# Patient Record
Sex: Male | Born: 1950
Health system: Southern US, Community
[De-identification: ages and names within clinical notes are randomized; demographics above are authoritative.]

## PROBLEM LIST (undated history)

## (undated) DIAGNOSIS — R601 Generalized edema: Secondary | ICD-10-CM

## (undated) DIAGNOSIS — D509 Iron deficiency anemia, unspecified: Secondary | ICD-10-CM

## (undated) DIAGNOSIS — M6281 Muscle weakness (generalized): Secondary | ICD-10-CM

## (undated) DIAGNOSIS — E119 Type 2 diabetes mellitus without complications: Secondary | ICD-10-CM

## (undated) DIAGNOSIS — L0390 Cellulitis, unspecified: Secondary | ICD-10-CM

## (undated) DIAGNOSIS — E8809 Other disorders of plasma-protein metabolism, not elsewhere classified: Secondary | ICD-10-CM

## (undated) DIAGNOSIS — R569 Unspecified convulsions: Secondary | ICD-10-CM

## (undated) DIAGNOSIS — I10 Essential (primary) hypertension: Secondary | ICD-10-CM

## (undated) DIAGNOSIS — N049 Nephrotic syndrome with unspecified morphologic changes: Secondary | ICD-10-CM

## (undated) DIAGNOSIS — E1121 Type 2 diabetes mellitus with diabetic nephropathy: Secondary | ICD-10-CM

## (undated) DIAGNOSIS — R748 Abnormal levels of other serum enzymes: Secondary | ICD-10-CM

## (undated) DIAGNOSIS — E778 Other disorders of glycoprotein metabolism: Secondary | ICD-10-CM

## (undated) DIAGNOSIS — E1129 Type 2 diabetes mellitus with other diabetic kidney complication: Secondary | ICD-10-CM

## (undated) DIAGNOSIS — K922 Gastrointestinal hemorrhage, unspecified: Secondary | ICD-10-CM

## (undated) DIAGNOSIS — I5030 Unspecified diastolic (congestive) heart failure: Secondary | ICD-10-CM

## (undated) DIAGNOSIS — A150 Tuberculosis of lung: Secondary | ICD-10-CM

## (undated) DIAGNOSIS — R262 Difficulty in walking, not elsewhere classified: Secondary | ICD-10-CM

## (undated) DIAGNOSIS — I8290 Acute embolism and thrombosis of unspecified vein: Secondary | ICD-10-CM

## (undated) HISTORY — DX: Difficulty in walking, not elsewhere classified: R26.2

## (undated) HISTORY — DX: Tuberculosis of lung: A15.0

## (undated) HISTORY — DX: Essential (primary) hypertension: I10

## (undated) HISTORY — DX: Muscle weakness (generalized): M62.81

## (undated) HISTORY — DX: Type 2 diabetes mellitus without complications: E11.9

## (undated) HISTORY — DX: Type 2 diabetes mellitus with other diabetic kidney complication: E11.29

## (undated) HISTORY — DX: Iron deficiency anemia, unspecified: D50.9

## (undated) HISTORY — DX: Type 2 diabetes mellitus with diabetic nephropathy: E11.21

## (undated) HISTORY — DX: Other disorders of glycoprotein metabolism: E77.8

---

## 2003-04-25 ENCOUNTER — Ambulatory Visit (HOSPITAL_COMMUNITY): Admission: RE | Admit: 2003-04-25 | Discharge: 2003-04-25 | Payer: Self-pay | Admitting: Chiropractic Medicine

## 2003-04-25 ENCOUNTER — Encounter: Payer: Self-pay | Admitting: Chiropractic Medicine

## 2008-10-10 ENCOUNTER — Emergency Department (HOSPITAL_COMMUNITY): Admission: EM | Admit: 2008-10-10 | Discharge: 2008-10-10 | Payer: Self-pay | Admitting: Family Medicine

## 2008-11-25 HISTORY — PX: KNEE ARTHROSCOPY: SHX127

## 2009-04-18 ENCOUNTER — Ambulatory Visit (HOSPITAL_COMMUNITY): Admission: RE | Admit: 2009-04-18 | Discharge: 2009-04-18 | Payer: Self-pay | Admitting: Family Medicine

## 2009-07-04 ENCOUNTER — Ambulatory Visit (HOSPITAL_COMMUNITY): Admission: RE | Admit: 2009-07-04 | Discharge: 2009-07-04 | Payer: Self-pay | Admitting: Orthopaedic Surgery

## 2009-08-30 ENCOUNTER — Encounter: Admission: RE | Admit: 2009-08-30 | Discharge: 2009-10-17 | Payer: Self-pay | Admitting: Orthopaedic Surgery

## 2010-08-31 ENCOUNTER — Encounter: Admission: RE | Admit: 2010-08-31 | Discharge: 2010-08-31 | Payer: Self-pay | Admitting: Specialist

## 2011-03-02 LAB — CBC
MCHC: 33.4 g/dL (ref 30.0–36.0)
MCV: 88 fL (ref 78.0–100.0)
Platelets: 201 10*3/uL (ref 150–400)
RBC: 4.73 MIL/uL (ref 4.22–5.81)
WBC: 6.1 10*3/uL (ref 4.0–10.5)

## 2011-03-02 LAB — HEPATIC FUNCTION PANEL
ALT: 30 U/L (ref 0–53)
Albumin: 3.1 g/dL — ABNORMAL LOW (ref 3.5–5.2)
Alkaline Phosphatase: 59 U/L (ref 39–117)
Total Protein: 6 g/dL (ref 6.0–8.3)

## 2011-03-02 LAB — BASIC METABOLIC PANEL
BUN: 13 mg/dL (ref 6–23)
Calcium: 8.9 mg/dL (ref 8.4–10.5)
Creatinine, Ser: 0.53 mg/dL (ref 0.4–1.5)
GFR calc Af Amer: >60 mL/min (ref >60)
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
## 2011-10-02 ENCOUNTER — Encounter: Payer: Self-pay | Admitting: Adult Health

## 2011-10-02 ENCOUNTER — Emergency Department (HOSPITAL_COMMUNITY)
Admission: EM | Admit: 2011-10-02 | Discharge: 2011-10-02 | Disposition: A | Discharge status: Home / Self Care | Payer: Self-pay | Attending: Emergency Medicine | Admitting: Emergency Medicine

## 2011-10-02 DIAGNOSIS — Z760 Encounter for issue of repeat prescription: Secondary | ICD-10-CM | POA: Insufficient documentation

## 2011-10-02 DIAGNOSIS — G8929 Other chronic pain: Secondary | ICD-10-CM | POA: Insufficient documentation

## 2011-10-02 DIAGNOSIS — M25569 Pain in unspecified knee: Secondary | ICD-10-CM | POA: Insufficient documentation

## 2011-10-02 NOTE — ED Notes (Signed)
Requesting medication refills

## 2011-10-02 NOTE — ED Provider Notes (Signed)
History    CSN: [id number] Arrival date & time: 10/02/2011  2:26 PM HPI LOCATES Joshua [name] is a 60 y.o. M who has chronic right knee pain due to multiple Arthroscopies. Reports he used a "cream" he borrowed from a friend that worked really well for his pain. States he is here for a prescription of the medication. States he called his Orthopedic physician. And was told he would need a $230 copay. States he is unable to afford that and was advised to come to the ED for the prescription. Denies a change in his knee pain. Denies erythema, edema, or new injury.   No past medical history on file.  No past surgical history on file.  No family history on file.  History  Substance Use Topics  . Smoking status: Not on file  . Smokeless tobacco: Not on file  . Alcohol Use: Not on file      Review of Systems  Constitutional: Negative for fever and chills.  Musculoskeletal: Negative for back pain and joint swelling.       Positive for knee pain  All other systems reviewed and are negative.    Allergies  Review of patient's allergies indicates not on file.  Home Medications  No current outpatient prescriptions on file.  BP 143/83  Pulse 80  Temp(Src) 98.4 F (36.9 C) (Oral)  Resp 18  SpO2 95%  Physical Exam  Constitutional: He is oriented to person, place, and time. He appears well-developed and well-nourished.  HENT:  Head: Normocephalic and atraumatic.  Eyes: Pupils are equal, round, and reactive to light.  Musculoskeletal:       Right knee: Normal. He exhibits normal range of motion, no swelling, no effusion, no deformity, no laceration, no erythema, normal alignment, no LCL laxity and normal patellar mobility.  Neurological: He is alert and oriented to person, place, and time.  Skin: Skin is warm and dry. No rash noted. No erythema. No pallor.  Psychiatric: He has a normal mood and affect. His behavior is normal.    ED Course  Procedures   3:39 PM. Waiting to  speak with [facility] Pharmacist. Currently waiting for 17 minutes Spoke with Pharmacist at [place]. States all ingredients need percentages and then regular sig. Will need to handwrite Rx  Dual Action A Compound Cream Amantadine 3%, Diclofenac 3%, Baclofen 2%, Gabapentin 10%, Cyclobenzaprine 2 %, Bupivicaine 5% Apply to affected area BID prn pain [place], [place] 10/02/11 1626

## 2011-10-03 NOTE — ED Provider Notes (Signed)
Medical screening examination/treatment/procedure(s) were performed by non-physician practitioner and as supervising physician I was immediately available for consultation/collaboration.   Alfonzo Feller, DO 10/03/11 [phone]

## 2012-09-11 ENCOUNTER — Ambulatory Visit
Admission: RE | Admit: 2012-09-11 | Discharge: 2012-09-11 | Disposition: A | Discharge status: Home / Self Care | Payer: No Typology Code available for payment source | Source: Ambulatory Visit | Attending: Specialist | Admitting: Specialist

## 2012-09-11 ENCOUNTER — Other Ambulatory Visit: Payer: Self-pay | Admitting: Specialist

## 2012-09-11 DIAGNOSIS — R7611 Nonspecific reaction to tuberculin skin test without active tuberculosis: Secondary | ICD-10-CM

## 2012-09-24 DIAGNOSIS — A150 Tuberculosis of lung: Secondary | ICD-10-CM

## 2012-09-24 HISTORY — DX: Tuberculosis of lung: A15.0

## 2012-10-07 ENCOUNTER — Encounter: Payer: Self-pay | Admitting: Internal Medicine

## 2012-10-07 ENCOUNTER — Ambulatory Visit (INDEPENDENT_AMBULATORY_CARE_PROVIDER_SITE_OTHER): Payer: Self-pay | Admitting: Internal Medicine

## 2012-10-07 VITALS — BP 166/96 | HR 92 | Temp 97.0°F | Ht 65.0 in | Wt 178.7 lb

## 2012-10-07 DIAGNOSIS — I872 Venous insufficiency (chronic) (peripheral): Secondary | ICD-10-CM

## 2012-10-07 DIAGNOSIS — A150 Tuberculosis of lung: Secondary | ICD-10-CM

## 2012-10-07 DIAGNOSIS — Z0000 Encounter for general adult medical examination without abnormal findings: Secondary | ICD-10-CM

## 2012-10-07 DIAGNOSIS — I8310 Varicose veins of unspecified lower extremity with inflammation: Secondary | ICD-10-CM

## 2012-10-07 DIAGNOSIS — Z23 Encounter for immunization: Secondary | ICD-10-CM

## 2012-10-07 HISTORY — DX: Tuberculosis of lung: A15.0

## 2012-10-07 NOTE — Assessment & Plan Note (Addendum)
The clinical presentation is most consistent with venous insufficiency and venous stasis dermatitis.  He is on his feet for long periods of time at his work.  I do not suspect heart failure (no evidence of pulmonary or hepatic congestion).  Nephrotic syndrome and liver failure are less likely since the edema is confined to the lower extremities.  Still we will need to check a CMET and a urinalysis once he has his orange card.  I have encouraged him to obtain compression stockings in the meantime and keep his feet elevated. - CMET after orange card - U/A after orange card

## 2012-10-07 NOTE — Assessment & Plan Note (Signed)
Quantiferon serum test was positive and CXR was suspicous.  AFB cultures at the health department were negative and he was deemed non-infectious. Twice weekly for the next 24 weeks: isoniazid 900mg , rifampin 600mg , and vitamin B6 50mg  Twice weekly for the next 6 weeks: ethambutol 4000mg  and pyrazinamide 4000mg 

## 2012-10-07 NOTE — Progress Notes (Signed)
Subjective:    Patient ID: Joshua [name], male    DOB: [birth year], 61 y.o.   MRN: [id number]  CC: leg swelling and pain  HPI:  This is a 61 year old man presenting to the clinic to establish care and with the complaint of lower extremity swelling and pain.  He immigrated to the [place] from [place] and has been seen twice by an immigration physician.  Once in 2011 when a serum quantiferon test for TB was performed and returned positive and then again in October 2013 for follow where a PA chest radiograph demonstrated some suspicious findings for TB.  Since then, sputum AFB cultures have returned negative at the [facility] and the patient has been deemed non-infectious.  He is receiving observed biweekly therapy through the health department.  For the leg swelling and pain, onset was several months ago.  The patient believes it was provoked by a mechanical fall and injury to his knee.  The pain is in his lower leg and feet bilaterally.  The swelling is now associated with weeping wounds, some open, on the anterior aspect of his legs right > left.    Review of Systems  Constitutional: Negative.  Negative for fever, chills, diaphoresis and unexpected weight change.  HENT: Negative.  Negative for hearing loss, congestion and rhinorrhea.   Eyes: Positive for visual disturbance (blurred).  Respiratory: Negative.  Negative for cough and shortness of breath.   Cardiovascular: Positive for leg swelling. Negative for chest pain.       Negative for orthopnea  Gastrointestinal: Negative.  Negative for nausea, vomiting, abdominal pain, diarrhea, constipation and blood in stool.  Genitourinary: Negative.  Negative for dysuria and hematuria.  Musculoskeletal: Positive for back pain.  Skin: Positive for rash (anterior lower leg).  Neurological: Negative.  Negative for dizziness, weakness, light-headedness, numbness and headaches.    Past Medical History  Diagnosis Date  . 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 Knee arthroscopy 2010    3 times, right knee    Current Outpatient Rx  Name Route Sig  . ETHAMBUTOL HCL 400 MG PO TABS Oral  Take 4,000 mg by mouth 2 (two) times a week.   . ISONIAZID 300 MG PO TABS Oral  Take 900 mg by mouth 2 (two) times a week.   . METHOCARBAMOL 500 MG PO TABS Oral  Take 500 mg by mouth 3 (three) times daily as needed. For muscle spasms   . PYRAZINAMIDE 500 MG PO TABS Oral  Take 4,000 mg by mouth 2 (two) times a week.   Marland Kitchen VITAMIN B-6 50 MG PO TABS Oral  Take 50 mg by mouth 2 (two) times a week.   Marland Kitchen RIFAMPIN 300 MG PO CAPS Oral  Take 600 mg by mouth 2 (two) times a week.     Family History None   History   Social History  . Marital Status: Single    Number of Children: 5  . 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 Smoking status: Former Smoker -- 20 years    Types: Cigarettes    Quit date: 11/25/1992  . Smokeless tobacco: Never Used  . Alcohol Use: 7.5 oz/week    15 drink(s) per week  . Drug Use: No  . Sexually Active: Yes -- Male partner(s)    Birth Control/  Protection: Condom     Comment: 09/2012: > 15 partners in past 3 years, inconsistent condom use   Social History Narrative   Has a masters degree in education      Objective:   Physical Exam GENERAL: overweight; no acute distress HEAD: atraumatic, normocephalic EYES: pupils equal, round and reactive; sclera anicteric; normal conjunctiva; fundoscopic exam limited by miosis EARS: canals and TMs normal bilaterally NOSE/THROAT: oropharynx clear, moist mucous membranes, several carious teeth NECK: supple, thyroid normal in size and without palpable nodules LYMPH: no cervical or supraclavicular lymphadenopathy LUNGS: clear to auscultation bilaterally, normal work of breathing HEART: normal rate and regular rhythm; normal S1  and S2 without S3 or S4; no murmurs, rubs, or clicks ABDOMEN: soft, non-tender, normal bowel sounds, no masses, liver 10cm by percussion, no palpable hepatosplenomegaly MSK: full range of motion of knee and ankles MOTOR: strength examination limited by pain in right lower leg but strength seemed intact and equal with leg extension; leg flexion, dorsiflexion, and plantarflexion were 5/5 bilaterally SENSATION: intact in the feet and legs REFLEXES: edema limited reflex testing, unable to elicit patellar or achilles reflexes; biceps reflex was 2+ and symmetric bilaterally CRANIAL NERVES: pupils reactive to light bilaterally; extra occular muscles are intact; facial sensation is intact and equal bilaterally in V1, V2, and V3, and masseter and temporalis function is intact; forehead wrinkles symmetrically, orbicularis oculi strength is normal and equal bilaterally, smile is symmetric, cheeks puff out equally without air excursion, and depressor anguli oris function is intact bilaterally; hearing is equal bilaterally, tested with a tuning fork; uvula is midline and palate elevates symmetrically; trapezius and sternocleidomastoid strength is normal and equal bilaterally; tongue protrudes midline. PULSES: dorsalis pedis, posterior tibial, and radial pulses are all 2+ SKIN: there is a flaky breakdown of the skin on the anterior shin consistent with venous stasis dermatitis, some associated open wounds on the right anterior shin draining a serous fluid, no erythema or warmth appreciated, toe nails are thickened and discolored   Filed Vitals:   10/07/12 1006  BP: 166/96  Pulse: 92  Temp:          Assessment & Plan:

## 2012-10-07 NOTE — Patient Instructions (Signed)
Look into sexually transmitted disease screening at the health department.  Look for compression stockings at a drug store or pharmacy.  Gather the information needed for your appointment with our financial counselor next week.  See [place] back her next Wednesday.  Venous Stasis and Chronic Venous Insufficiency  As people age, the veins located in their legs may weaken and stretch. When veins weaken and lose the ability to pump blood effectively, the condition is called chronic venous insufficiency (CVI) or venous stasis. Almost all veins return blood back to the heart. This happens by:  The force of the heart pumping fresh blood pushes blood back to the heart.  Blood flowing to the heart from the force of gravity.  In the deep veins of the legs, blood has to fight gravity and flow upstream back to the heart. Here, the leg muscles contract to pump blood back toward the heart. Vein walls are elastic, and many veins have small valves that only allow blood to flow in one direction. When leg muscles contract, they push inward against the elastic vein walls. This squeezes blood upward, opens the valves, and moves blood toward the heart. When leg muscles relax, the vein wall also relaxes and the valves inside the vein close to prevent blood from flowing backward. This method of pumping blood out of the legs is called the venous pump.  CAUSES  The venous pump works best while walking and leg muscles are contracting. But when a person sits or stands, blood pressure in leg veins can build. Deep veins are usually able to withstand short periods of inactivity, but long periods of inactivity (and increased pressure) can stretch, weaken, and damage vein walls. High blood pressure can also stretch and damage vein walls. The veins may no longer be able to pump blood back to the heart. Venous hypertension (high blood pressure inside veins) that lasts over time is a primary cause of CVI. CVI can also be caused by:  Deep  vein thrombosis, a condition where a thrombus (blood clot) blocks blood flow in a vein.  Phlebitis, an inflammation of a superficial vein that causes a blood clot to form.  Other risk factors for CVI may include:  Heredity.  Obesity.  Pregnancy.  Sedentary lifestyle.  Smoking.  Jobs requiring long periods of standing or sitting in one place.  Age and gender:  Women in their 15's and 92's and men in their 15's are more prone to developing CVI.  SYMPTOMS  Symptoms of CVI may include:  Varicose veins.  Ulceration or skin breakdown.  Lipodermatosclerosis, a condition that affects the skin just above the ankle, usually on the inside surface. Over time the skin becomes brown, smooth, tight and often painful. Those with this condition have a high risk of developing skin ulcers.  Reddened or discolored skin on the leg.  Swelling.  DIAGNOSIS  Your caregiver can diagnose CVI after performing a careful medical history and physical examination. To confirm the diagnosis, the following tests may also be ordered:  Duplex ultrasound.  Plethysmography (tests blood flow).  Venograms (x-ray using a special dye).  TREATMENT  The goals of treatment for CVI are to restore a person to an active life and to minimize pain or disability. Typically, CVI does not pose a serious threat to life or limb, and with proper treatment most people with this condition can continue to lead active lives. In most cases, mild CVI can be treated on an outpatient basis with simple procedures. Treatment methods  include:  Elastic compression socks.  Sclerotherapy, a procedure involving an injection of a material that "dissolves" the damaged veins. Other veins in the network of blood vessels take over the function of the damaged veins.  Vein stripping (an older procedure less commonly used).  Laser Ablation surgery.  Valve repair.  HOME CARE INSTRUCTIONS  Elastic compression socks must be worn every day. They can help with symptoms  and lower the chances of the problem getting worse, but they do not cure the problem.  Only take over-the-counter or prescription medicines for pain, discomfort, or fever as directed by your caregiver.  Your caregiver will review your other medications with you.  SEEK MEDICAL CARE IF:  You are confused about how to take your medications.  There is redness, swelling, or increasing pain in the affected area.  There is a red streak or line that extends up or down from the affected area.  There is a breakdown or loss of skin in the affected area, even if the breakdown is small.  You develop an unexplained oral temperature above 102 F (38.9 C).  There is an injury to the affected area.  SEEK IMMEDIATE MEDICAL CARE IF:  There is an injury and open wound to the affected area.  Pain is not adequately relieved with pain medication prescribed or becomes severe.  An oral temperature above 102 F (38.9 C) develops.  The foot/ankle below the affected area becomes suddenly numb or the area feels weak and hard to move.  MAKE SURE YOU:  Understand these instructions.  Will watch your condition.  Will get help right away if you are not doing well or get worse.  Document Released: 03/17/2007 Document Revised: 02/03/2012 Document Reviewed: 05/25/2007  [name] Patient Information 2013 [place].

## 2012-10-07 NOTE — Assessment & Plan Note (Addendum)
He received a flu shot today.  He is eligible for a zoster vaccine.  Patient is high risk for STDs.  Will need screening for HIV, gonorrhea, chlamydia, and syphilis.  I encouraged him to look into having this done at the health department.  He also needs screening for dyslipidemia, colon cancer, and diabetes.  We will do all these things once he has his orange card. - colonoscopy referral, lipid panel, and HgbA1c after orange card - continue monitoring for hypertension, blood pressure was 166/96 today

## 2012-10-14 ENCOUNTER — Encounter: Payer: Self-pay | Admitting: Internal Medicine

## 2012-10-14 ENCOUNTER — Ambulatory Visit (INDEPENDENT_AMBULATORY_CARE_PROVIDER_SITE_OTHER): Payer: Self-pay | Admitting: Internal Medicine

## 2012-10-14 VITALS — BP 162/98 | HR 90 | Temp 96.8°F | Ht 65.5 in | Wt 180.7 lb

## 2012-10-14 DIAGNOSIS — Z7251 High risk heterosexual behavior: Secondary | ICD-10-CM

## 2012-10-14 DIAGNOSIS — I872 Venous insufficiency (chronic) (peripheral): Secondary | ICD-10-CM

## 2012-10-14 DIAGNOSIS — I10 Essential (primary) hypertension: Secondary | ICD-10-CM

## 2012-10-14 DIAGNOSIS — I8310 Varicose veins of unspecified lower extremity with inflammation: Secondary | ICD-10-CM

## 2012-10-14 HISTORY — DX: Essential (primary) hypertension: I10

## 2012-10-14 LAB — COMPREHENSIVE METABOLIC PANEL
ALT: 54 U/L — ABNORMAL HIGH (ref 0–53)
CO2: 27 mEq/L (ref 19–32)
Calcium: 8.7 mg/dL (ref 8.4–10.5)
Chloride: 107 mEq/L (ref 96–112)
Glucose, Bld: 165 mg/dL — ABNORMAL HIGH (ref 70–99)
Sodium: 141 mEq/L (ref 135–145)
Total Bilirubin: 0.2 mg/dL — ABNORMAL LOW (ref 0.3–1.2)
Total Protein: 5.8 g/dL — ABNORMAL LOW (ref 6.0–8.3)

## 2012-10-14 MED ORDER — HORSE CHESTNUT 300 MG PO TABS: 300.0000 mg | ORAL_TABLET | Freq: Two times a day (BID) | ORAL | Status: DC

## 2012-10-14 MED ORDER — CHLORTHALIDONE 25 MG PO TABS: 25.0000 mg | ORAL_TABLET | Freq: Every day | ORAL | Status: DC

## 2012-10-14 NOTE — Patient Instructions (Addendum)
We are checking your blood chemistries, kidney function, and liver function today as well as for protein in your urine.  Please start taking chlorthalidone 25mg  once a day for your high blood pressures and for your leg swelling.  Again, try to find thigh-high compression stockings to wear at work.  You can try horse chestnut extract (escin) for you leg swelling.  If you get this, take 300mg  (1 tablet) twice a day.  Please go to the health department so that they can test you for HIV, syphilis, gonorrhea, and chlamydia.

## 2012-10-14 NOTE — Assessment & Plan Note (Addendum)
I discussed with the patient the reason we treat hypertension.  I also provided him with some educational material on hypertension.  I initiated therapy with chlorthalidone 25mg  daily.  I am checking for microalbuminuria today and evaluating kidney function. - chlorthalidone 25mg  daily  ADDENDUM: see telephone encounter 10/15/2012  Urine Microalbumin/Creatinine Ratio 2946.9*

## 2012-10-14 NOTE — Assessment & Plan Note (Addendum)
I still think venous insufficiency is the most likely diagnosis, but the new complaint of scrotal edema is concerning.  We will check renal and liver function today with a CMET and obtain a urinalysis for evaluation of proteinuria.  The absence of dyspnea and orthopnea make heart failure unlikely.  I have prescribed chlorthalidone 25mg  to treat hypertension as well as edema.  I have encouraged the patient to obtain and wear compressive, thigh-high stockings at work, and I have suggested the use of chestnut horse extract as this has been shown in placebo-controlled studies to be as efficacious as compressive stockings.  ADDENDUM: see telephone encounter 10/15/2012   CMP     Component Value Date/Time   NA 141 10/14/2012 1040   K 4.9 10/14/2012 1040   CL 107 10/14/2012 1040   CO2 27 10/14/2012 1040   GLUCOSE 165* 10/14/2012 1040   BUN 21 10/14/2012 1040   CREATININE 1.04 10/14/2012 1040   CREATININE 0.53 06/29/2009 1411   CALCIUM 8.7 10/14/2012 1040   PROT 5.8* 10/14/2012 1040   ALBUMIN 2.4* 10/14/2012 1040   AST 50* 10/14/2012 1040   ALT 54* 10/14/2012 1040   ALKPHOS 167* 10/14/2012 1040   BILITOT 0.2* 10/14/2012 1040   GFRNONAA >60 06/29/2009 1411   GFRAA >60 06/29/2009 1411   Urinalysis    Component Value Date/Time   COLORURINE YELLOW 10/14/2012 1040   APPEARANCEUR CLEAR 10/14/2012 1040   LABSPEC 1.027 10/14/2012 1040   PHURINE 5.5 10/14/2012 1040   GLUCOSEU 100* 10/14/2012 1040   HGBUR MOD* 10/14/2012 1040   BILIRUBINUR NEG 10/14/2012 1040   KETONESUR NEG 10/14/2012 1040   PROTEINUR > 300* 10/14/2012 1040   UROBILINOGEN 0.2 10/14/2012 1040   NITRITE NEG 10/14/2012 1040   LEUKOCYTESUR NEG 10/14/2012 1040

## 2012-10-14 NOTE — Assessment & Plan Note (Signed)
I again encourage the patient to go to the health department to receive screening for HIV, syphilis, gonorrhea, and chlamydia.

## 2012-10-14 NOTE — Progress Notes (Signed)
  Subjective:    Patient ID: Joshua [name], male    DOB: [birth year], 61 y.o.   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  One week ago, he had his first visit with [place].  He was complaining at that time of several months of lower extremity edema.  I suggested he obtain compressive stockings.  He has not been able to obtain these; they are quite expensive.  Now, however, he is complaining of painless scrotal swelling.  The serous drainage he was experiencing from his anterior lower legs is not active today, but is generally unchanged from 1 week ago.  He does complain of an isolated episode of dyspnea four days ago.  He was at work.  He bent down to tie his shoes and he acutely became short of breath.  A friend at work gave him an albuterol inhaler which he used.  This relieved the shortness of breath.  There was some mild chest pain associated but quickly resolved in a minute or two.  This has never happened to him before, and it has not happened since.  On review of systems, he denies cough, cold symptoms, sinonasal congestion, dyspnea at baseline, other episodes of chest pain, and orthopnea.    Review of Systems  Constitutional: Negative.  Negative for fever, chills and unexpected weight change.  HENT: Negative.  Negative for congestion and rhinorrhea.   Respiratory: Positive for shortness of breath. Negative for cough.   Cardiovascular: Positive for leg swelling. Negative for chest pain (negative except for HPI).       Negative for orthopnea  Genitourinary: Positive for scrotal swelling. Negative for testicular pain.       Objective:   Physical Exam GENERAL: overweight; no acute distress LUNGS: clear to auscultation bilaterally, normal work of breathing HEART: normal rate and regular rhythm; normal S1 and S2 without S3 or S4; no murmurs, rubs, or clicks ABDOMEN: distended but  soft, non-tender GU: normal circumcised penis, non-tender testicles, swollen scrotum SKIN:there is a flaky breakdown of the skin on the anterior shin consistent with venous stasis dermatitis, no erythema or warmth appreciated EXTREMITIES: 2+ pitting edema at the ankles   Filed Vitals:   10/14/12 0937  BP: 162/98  Pulse: 90  Temp: 96.8 F (36 C)        Assessment & Plan:

## 2012-10-15 ENCOUNTER — Telehealth: Payer: Self-pay | Admitting: Internal Medicine

## 2012-10-15 DIAGNOSIS — E1129 Type 2 diabetes mellitus with other diabetic kidney complication: Secondary | ICD-10-CM | POA: Insufficient documentation

## 2012-10-15 DIAGNOSIS — E778 Other disorders of glycoprotein metabolism: Secondary | ICD-10-CM

## 2012-10-15 DIAGNOSIS — E1121 Type 2 diabetes mellitus with diabetic nephropathy: Secondary | ICD-10-CM | POA: Insufficient documentation

## 2012-10-15 DIAGNOSIS — R739 Hyperglycemia, unspecified: Secondary | ICD-10-CM

## 2012-10-15 DIAGNOSIS — I10 Essential (primary) hypertension: Secondary | ICD-10-CM

## 2012-10-15 DIAGNOSIS — R600 Localized edema: Secondary | ICD-10-CM

## 2012-10-15 DIAGNOSIS — R809 Proteinuria, unspecified: Secondary | ICD-10-CM

## 2012-10-15 DIAGNOSIS — E8809 Other disorders of plasma-protein metabolism, not elsewhere classified: Secondary | ICD-10-CM | POA: Insufficient documentation

## 2012-10-15 HISTORY — DX: Other disorders of glycoprotein metabolism: E77.8

## 2012-10-15 LAB — URINALYSIS, ROUTINE W REFLEX MICROSCOPIC
Leukocytes, UA: NEGATIVE
Nitrite: NEGATIVE
Protein, ur: >300 mg/dL — AB
Urobilinogen, UA: 0.2 mg/dL (ref 0.0–1.0)

## 2012-10-15 LAB — MICROALBUMIN / CREATININE URINE RATIO: Microalb, Ur: 450.28 mg/dL — ABNORMAL HIGH (ref 0.00–1.89)

## 2012-10-15 LAB — URINALYSIS, MICROSCOPIC ONLY: Bacteria, UA: NONE SEEN

## 2012-10-15 MED ORDER — FUROSEMIDE 40 MG PO TABS: 40.0000 mg | ORAL_TABLET | Freq: Every day | ORAL | Status: DC

## 2012-10-15 NOTE — Assessment & Plan Note (Signed)
Concerning for nephrotic syndrome secondary to long-standing diabetes mellitus.

## 2012-10-15 NOTE — Assessment & Plan Note (Addendum)
Switching chlorthalidone to furosemide for increased diuretic effect. - stop chlorthalidone 25mg  daily - start furosemide 40mg  daily

## 2012-10-15 NOTE — Assessment & Plan Note (Signed)
Concerning for diabetes.  May be the cause of near nephrotic range proteinuria.  Hemoglobin A1c needed at next visit.

## 2012-10-15 NOTE — Telephone Encounter (Signed)
Spoke with patient.  Informed him of lab results.  Instructed him to stop taking chlorthalidone and instead start taking furosemide.  Rx sent to pharmacy.  Asked patient to [facility] December 6.  Message sent to front desk for rescheduling.  Urinalysis    Component Value Date/Time   COLORURINE YELLOW 10/14/2012 1040   APPEARANCEUR CLEAR 10/14/2012 1040   LABSPEC 1.027 10/14/2012 1040   PHURINE 5.5 10/14/2012 1040   GLUCOSEU 100* 10/14/2012 1040   HGBUR MOD* 10/14/2012 1040   BILIRUBINUR NEG 10/14/2012 1040   KETONESUR NEG 10/14/2012 1040   PROTEINUR > 300* 10/14/2012 1040   UROBILINOGEN 0.2 10/14/2012 1040   NITRITE NEG 10/14/2012 1040   LEUKOCYTESUR NEG 10/14/2012 1040    CMP     Component Value Date/Time   NA 141 10/14/2012 1040   K 4.9 10/14/2012 1040   CL 107 10/14/2012 1040   CO2 27 10/14/2012 1040   GLUCOSE 165* 10/14/2012 1040   BUN 21 10/14/2012 1040   CREATININE 1.04 10/14/2012 1040   CREATININE 0.53 06/29/2009 1411   CALCIUM 8.7 10/14/2012 1040   PROT 5.8* 10/14/2012 1040   ALBUMIN 2.4* 10/14/2012 1040   AST 50* 10/14/2012 1040   ALT 54* 10/14/2012 1040   ALKPHOS 167* 10/14/2012 1040   BILITOT 0.2* 10/14/2012 1040   GFRNONAA >60 06/29/2009 1411   GFRAA  Value: >60        The eGFR has been calculated using the MDRD equation. This calculation has not been validated in all clinical situations. eGFR's persistently <60 mL/min signify possible Chronic Kidney Disease. 06/29/2009 1411      Ref. Range 10/14/2012 10:40  Microalb, Ur Latest Range: 0.00-1.89 mg/dL 450.28 (H)  MICROALB/CREAT RATIO Latest Range: 0.0-30.0 mg/g 2946.9 (H)  Creatinine, Urine No range found 152.8

## 2012-10-15 NOTE — Assessment & Plan Note (Signed)
Concerning for hepatic congestion.  Failure of liver synthetic function can cause hypoproteinemia and edema, but not proteinuria.  Consider coagulation tests.

## 2012-10-15 NOTE — Assessment & Plan Note (Signed)
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

## 2012-10-27 ENCOUNTER — Ambulatory Visit (INDEPENDENT_AMBULATORY_CARE_PROVIDER_SITE_OTHER): Payer: Self-pay | Admitting: Internal Medicine

## 2012-10-27 ENCOUNTER — Encounter: Payer: Self-pay | Admitting: Internal Medicine

## 2012-10-27 VITALS — BP 158/86 | HR 106 | Temp 97.2°F | Ht 65.5 in | Wt 181.1 lb

## 2012-10-27 DIAGNOSIS — I10 Essential (primary) hypertension: Secondary | ICD-10-CM

## 2012-10-27 DIAGNOSIS — R809 Proteinuria, unspecified: Secondary | ICD-10-CM

## 2012-10-27 DIAGNOSIS — R600 Localized edema: Secondary | ICD-10-CM

## 2012-10-27 DIAGNOSIS — R7989 Other specified abnormal findings of blood chemistry: Secondary | ICD-10-CM

## 2012-10-27 DIAGNOSIS — E778 Other disorders of glycoprotein metabolism: Secondary | ICD-10-CM

## 2012-10-27 DIAGNOSIS — N5089 Other specified disorders of the male genital organs: Secondary | ICD-10-CM

## 2012-10-27 MED ORDER — FUROSEMIDE 40 MG PO TABS: 60.0000 mg | ORAL_TABLET | Freq: Two times a day (BID) | ORAL | Status: DC

## 2012-10-27 MED ORDER — LISINOPRIL 20 MG PO TABS: 20.0000 mg | ORAL_TABLET | Freq: Every day | ORAL | Status: DC

## 2012-10-27 NOTE — Assessment & Plan Note (Addendum)
Assessment: Likely contributed by his TB drug therapy, given last LFTs in 2010 normal versus possible HF contributing towards hepatic congestion (given scrotal and LE swelling) However, also has high-risk sexual behavior, therefore hepatitis and HIV must also be considered.  Plan:      Check hepatitis panel, HIV antibody, and GGT.  Recheck CMET today.  Tx as per scrotal swelling (see problem oriented charting)

## 2012-10-27 NOTE — Progress Notes (Addendum)
  Subjective:    Patient: Joshua [name]   Age/ Gender: 61 y.o., male   MRN: [id number]  DOB: 01/15/1951     HPI: Joshua [name] is a 61 y.o. with a PMHx of HTN and TB, who presented to clinic today for the following:  1) Lower extremity edema - Patient has been evaluated in [place] twice in 09/2012 for his several month history of BL lower extremity edema and scrotal swelling. It is thought to be secondary to venous stasis dermatitis, for which he was prescribed (and was previously noncompliant with) thigh-high stockings at work. Labs were checked, showing mild elevation of AST/ALT at 50/54 and Alk Phos of 167. These are up from 2010. Microalb/ cr ratio was also checked and found to be elevated at 2947. The patient was then started on lasix, which he is taking regularly. He has noted improvement in his LE edema. However, bilateral scrotal swelling without penile discharge or blood. Of note, the patient clarifies that his scrotal swelling preceded his use of the compression stockings.   Review of Systems: Per HPI.   Current Outpatient Medications: Medication Sig  . aspirin 325 MG EC tablet Take 325 mg by mouth daily.  Marland Kitchen ethambutol (MYAMBUTOL) 400 MG tablet Take 4,000 mg by mouth 2 (two) times a week.  . furosemide (LASIX) 40 MG tablet Take 1 tablet (40 mg total) by mouth daily.  . Horse Chestnut 300 MG TABS Take 1 tablet (300 mg total) by mouth 2 (two) times daily.  Marland Kitchen ibuprofen (ADVIL,MOTRIN) 200 MG tablet Take 200 mg by mouth every 6 (six) hours as needed.  . isoniazid (NYDRAZID) 300 MG tablet Take 900 mg by mouth 2 (two) times a week.  . pyrazinamide 500 MG tablet Take 4,000 mg by mouth 2 (two) times a week.  . pyridOXINE (VITAMIN B-6) 50 MG tablet Take 50 mg by mouth 2 (two) times a week.  . rifampin (RIFADIN) 300 MG capsule Take 600 mg by mouth 2 (two) times a week.    Allergies: No Known Allergies   Past Medical History  Diagnosis Date  . TB (pulmonary tuberculosis) 09/24/2012      deemed non-infectious  . Hypertension 10/14/2012    Past Surgical History  Procedure Date  . Knee arthroscopy 2010    3 times, right knee    Objective:    Physical Exam: Filed Vitals:   10/27/12 1614  BP: 158/86  Pulse: 106  Temp: 97.2 F (36.2 C)     General: Vital signs reviewed and noted. Well-developed, well-nourished, in no acute distress; alert, appropriate and cooperative throughout examination.  Head: Normocephalic, atraumatic.  Lungs:  Normal respiratory effort. Clear to auscultation BL without crackles or wheezes.  Heart: RRR. S1 and S2 normal without gallop, rubs. (+) systolic murmur.  Abdomen:  BS normoactive. Soft, Nondistended, non-tender.  No masses or organomegaly.  Extremities: 1+ pretibial edema. Compression stocking worn, therefore, difficult to assess character of LE in detail.  GU: Bilateral scrotal swelling (scrotum approximately 12cm (width) x9 cm (length)) with skin taut 2/2 fluid accumulation, with transillumination, consistent with fluid collection. No inguinal lymphadenopathy noted.  Exam chaperoned by Dr. Gilles Chiquito.    Assessment/ Plan:   Case and plan of care discussed with attending physician, Dr. Gilles Chiquito.

## 2012-10-27 NOTE — Assessment & Plan Note (Signed)
Assessment: Cause of patient scrotal swelling and lower extremity swelling remain unclear, however, there is concern for possible heart failure (particularly given history of uncontrolled HTN and mild LFT elevation per CMET) versus renal component (given significant proteinuria) contributing towards symptoms. On transillumination, seems to be significant fluid accumulation of scrotum. The patient remains without pain, color changes to scrotum, and swelling is bilateral, making epididymitis or testicular torsion unlikely to be contributing cause.  Plan:      Discussed with Dr. Daryll Drown.  Will increase Lasix to 60mg  BID. Pt to take 1.5 pills of his remaining Lasix 40mg  Rx.  Will start ACE-I (lisinopril 20mg ) to help with both the proteinuria, BP control, and potential HF.  If symptoms not improved by end of week (he is to follow-up with me on 12/6), then will consider scrotal ultrasound to further evaluate for GU pathology.  Will need a 2-D echo, however, pt cannot afford right now, and also states that because of his income, will be unable to get orange card.

## 2012-10-27 NOTE — Patient Instructions (Signed)
   Please follow-up at the clinic on 10/30/2012 at 2:45PM, at which time we will reevaluate your scrotal swelling - OR, please follow-up in the clinic sooner if needed.  There have been changes in your medications:  START Lisinopril for you blood pressure and for your swelling - this is to be taken ONE TIME a day.  CHANGE Furosemide to 60mg  (one and a half tablet) TWO TIMES a day.  STOP Ibuprofen and aspirin    If you have been started on new medication(s), and you develop symptoms concerning for allergic reaction, including, but not limited to, throat closing, tongue swelling, rash, please stop the medication immediately and call the clinic at [phone], and go to the ER.  If you are diabetic, please bring your meter to your next visit.  If symptoms worsen, or new symptoms arise, please call the clinic or go to the ER.  Please bring all of your medications in a bag to your next visit.

## 2012-10-28 LAB — COMPREHENSIVE METABOLIC PANEL
BUN: 27 mg/dL — ABNORMAL HIGH (ref 6–23)
CO2: 29 mEq/L (ref 19–32)
Creat: 0.83 mg/dL (ref 0.50–1.35)
Glucose, Bld: 192 mg/dL — ABNORMAL HIGH (ref 70–99)
Sodium: 142 mEq/L (ref 135–145)
Total Bilirubin: 0.6 mg/dL (ref 0.3–1.2)
Total Protein: 5.8 g/dL — ABNORMAL LOW (ref 6.0–8.3)

## 2012-10-28 LAB — HEPATITIS PANEL, ACUTE
Hep A IgM: NEGATIVE
Hep B C IgM: NEGATIVE
Hepatitis B Surface Ag: NEGATIVE

## 2012-10-28 NOTE — Progress Notes (Signed)
Quick Note:  LFTs downtrending from prior, although Alk Phos remains elevated. Will check GGT, have spoke with Linus Orn and Vickie in lab, who indicate this should be able to be added to labs drawn from yesterday.   ______

## 2012-10-28 NOTE — Addendum Note (Signed)
Addended by: Annamarie Dawley on: 10/28/2012 08:44 AM   Modules accepted: Orders

## 2012-10-29 LAB — GAMMA GT: GGT: 170 U/L — ABNORMAL HIGH (ref 7–51)

## 2012-10-29 NOTE — Progress Notes (Signed)
Quick Note:  Elevated alk phos in setting of elevated GGT suggests possible hepatic or biliary etiology. Will therefore consider RUQ [place]. Of note, hepatitis panel and HIV neg. ______

## 2012-10-30 ENCOUNTER — Encounter: Payer: Self-pay | Admitting: Internal Medicine

## 2012-10-30 ENCOUNTER — Ambulatory Visit (INDEPENDENT_AMBULATORY_CARE_PROVIDER_SITE_OTHER): Payer: Self-pay | Admitting: Internal Medicine

## 2012-10-30 VITALS — BP 150/81 | HR 97 | Temp 97.4°F | Wt 171.4 lb

## 2012-10-30 DIAGNOSIS — R7989 Other specified abnormal findings of blood chemistry: Secondary | ICD-10-CM

## 2012-10-30 DIAGNOSIS — I872 Venous insufficiency (chronic) (peripheral): Secondary | ICD-10-CM

## 2012-10-30 DIAGNOSIS — R739 Hyperglycemia, unspecified: Secondary | ICD-10-CM

## 2012-10-30 DIAGNOSIS — I10 Essential (primary) hypertension: Secondary | ICD-10-CM

## 2012-10-30 DIAGNOSIS — I8310 Varicose veins of unspecified lower extremity with inflammation: Secondary | ICD-10-CM

## 2012-10-30 DIAGNOSIS — N5089 Other specified disorders of the male genital organs: Secondary | ICD-10-CM

## 2012-10-30 DIAGNOSIS — R7309 Other abnormal glucose: Secondary | ICD-10-CM

## 2012-10-30 DIAGNOSIS — Z598 Other problems related to housing and economic circumstances: Secondary | ICD-10-CM

## 2012-10-30 NOTE — Progress Notes (Signed)
Patient: Joshua [name]   MRN: [id number]  DOB: 22-Jul-1951     Subjective:    HPI: Joshua [name] is a 61 y.o. M with a PMHx of HTN and TB, who presented to clinic today for recheck of the following:  1) Lower extremity edema / scrotal edema - Patient has been evaluated in [place] twice in 09/2012 for his several month history of BL lower extremity edema and scrotal swelling. It is thought to be secondary to venous stasis dermatitis, for which he was prescribed (and was previously noncompliant with) thigh-high stockings at work. Labs were checked, showing mild elevation of AST/ALT at 50/54 and Alk Phos of 167. These are up from 2010. Microalb/ cr ratio was also checked and found to be elevated at 2947. The patient was then started on lasix, which he is taking regularly. He has noted improvement in his LE edema. However, bilateral scrotal swelling without penile discharge or blood. Of note, the patient clarifies that his scrotal swelling preceded his use of the compression stockings.  During our visit together on 12/3, patient had significant discomfort from his scrotal edema, scrotum was very taut (although not painful), and in addition to his significant LE edema was thought possibly related to CHF (undiagnosed) in setting of untreated hypertension. He was started on ACE-I and Lasix 60mg  BID, and presents for reeval today.  States the swelling is persistent, but slightly improved. Remains without change in color or pain of his scrotum or testes. Remains without dysuria, hematuria, penile discharge, fevers, chills.  LE edema is improved. No SOB, DOE, CP. He is down 10 lbs.    Review of Systems: Per HPI.   Current Outpatient Medications: Medication Sig  . ethambutol (MYAMBUTOL) 400 MG tablet Take 4,000 mg by mouth 2 (two) times a week.  . furosemide (LASIX) 40 MG tablet Take 1.5 tablets (60 mg total) by mouth 2 (two) times daily.  . Horse Chestnut 300 MG TABS Take 1 tablet (300 mg total) by  mouth 2 (two) times daily.  Marland Kitchen isoniazid (NYDRAZID) 300 MG tablet Take 900 mg by mouth 2 (two) times a week.  Marland Kitchen lisinopril (PRINIVIL,ZESTRIL) 20 MG tablet Take 1 tablet (20 mg total) by mouth daily.  . pyrazinamide 500 MG tablet Take 4,000 mg by mouth 2 (two) times a week.  . pyridOXINE (VITAMIN B-6) 50 MG tablet Take 50 mg by mouth 2 (two) times a week.  . rifampin (RIFADIN) 300 MG capsule Take 600 mg by mouth 2 (two) times a week.    Allergies No Known Allergies   Past Medical History  Diagnosis Date  . 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 Hypertension 10/14/2012    Past Surgical History  Procedure Date  . Knee arthroscopy 2010    3 times, right knee     Objective:    Physical Exam: Filed Vitals:   10/30/12 1446  BP: 150/81  Pulse: 97  Temp: 97.4 F (36.3 C)      General: Vital signs reviewed and noted. Well-developed, well-nourished, in no acute distress; alert, appropriate and cooperative throughout examination.  Head: Normocephalic, atraumatic.  Lungs:  Normal respiratory effort. Clear to auscultation BL without crackles or wheezes.  Heart: RRR. S1 and S2 normal without gallop, rubs. (+) systolic murmur.  Abdomen:  BS normoactive. Soft, Nondistended, non-tender.  No masses or organomegaly.  Extremities: 1-2+ pretibial edema (improved from last time - the 2+ was revealed just below the knee after compression  stockings removed (not evaluated last visit). Bilateral significant dry and flaky skin. Multiple centimeter sized open wounds without active drainage on RLE. No surrounding erythema, induration. Mild warmth.  GU: Bilateral scrotal swelling (scrotum approximately 12cm (width) x9 cm (length)) with that is significantly less taut than 3 days ago. No inguinal lymphadenopathy noted.  Exam chaperoned by Sander Nephew, RN.     Assessment/ Plan:   Case and plan of care discussed with attending physician, Dr. Madilyn Fireman.

## 2012-10-30 NOTE — Patient Instructions (Signed)
   Please follow-up at the clinic in 1 week , at which time we will reevaluate your swelling - OR, please follow-up in the clinic sooner if needed.  There have not been changes in your medications.   You are getting labs today, if they are abnormal I will give you a call.   If you have been started on new medication(s), and you develop symptoms concerning for allergic reaction, including, but not limited to, throat closing, tongue swelling, rash, please stop the medication immediately and call the clinic at [phone], and go to the ER.  If symptoms worsen, or new symptoms arise, please call the clinic or go to the ER.  Please bring all of your medications in a bag to your next visit.

## 2012-10-31 LAB — BASIC METABOLIC PANEL
CO2: 28 mEq/L (ref 19–32)
Calcium: 8.8 mg/dL (ref 8.4–10.5)
Potassium: 4.4 mEq/L (ref 3.5–5.3)
Sodium: 139 mEq/L (ref 135–145)

## 2012-10-31 NOTE — Assessment & Plan Note (Signed)
Pertinent Data: Comprehensive Metabolic Panel:    Component Value Date/Time   NA 139 10/30/2012 1609   K 4.4 10/30/2012 1609   CL 105 10/30/2012 1609   CO2 28 10/30/2012 1609   BUN 20 10/30/2012 1609   CREATININE 1.04 10/30/2012 1609   CREATININE 0.53 06/29/2009 1411   GLUCOSE 193* 10/30/2012 1609   CALCIUM 8.8 10/30/2012 1609   AST 36 10/27/2012 1657   ALT 40 10/27/2012 1657   ALKPHOS 143* 10/27/2012 1657   BILITOT 0.6 10/27/2012 1657   PROT 5.8* 10/27/2012 1657   ALBUMIN 2.3* 10/27/2012 1657    Assessment: Alk phos remains elevated, GGT was also elevated,   Plan:

## 2012-10-31 NOTE — Assessment & Plan Note (Addendum)
Assessment: Cause of patient scrotal swelling and lower extremity swelling remain unclear, however, there is concern for possible heart failure (particularly given history of uncontrolled HTN and mild LFT elevation per CMET) versus renal component (given significant proteinuria) contributing towards symptoms.  The patient remains without pain, color changes to scrotum, and swelling is bilateral, making epididymitis or testicular torsion unlikely to be contributing cause. Case reports have also identified scrotal swelling (and moreso testicular or epidydimal) extrapulmonary manifestations of TB - this has not yet been able to be evaluated as patient cannot afford scrotal US at this time.  Plan:      Discussed with Dr. Ellwood Dense.  Continue Lasix to 60mg  BID and ACE-I (lisinopril 20mg ) to help with both the proteinuria, BP control, and potential HF.  Will bring back in 1 week to reevaluate and consider scrotal ultrasound to further evaluate for GU pathology (will need to check into how much out of pocket expense will be for the patient - as cost is very prohibitive).  Will need a 2-D echo, however, pt cannot afford right now, and also states that because of his income, will be unable to get orange card.  At next visit, will need urinalysis and urine culture to evaluate for possible TB involvement of the testes.

## 2012-11-02 DIAGNOSIS — Z598 Other problems related to housing and economic circumstances: Secondary | ICD-10-CM | POA: Insufficient documentation

## 2012-11-02 NOTE — Assessment & Plan Note (Signed)
Pertinent Data:  Ref. Range 10/14/2012  10/27/2012  10/30/2012   Glucose Latest Range: 70-99 mg/dL 165 (H) 192 (H) 193 (H)    Assessment: Will need to clarify with the patient regarding if these were fasting or non-fasting evaluations. If non-fasting and < 2hours postprandial, would not quite be to range of DM. However, if these are fasting values, then certainly concerning.  Plan:      Will review with pt next visit, consider A1c.

## 2012-11-02 NOTE — Assessment & Plan Note (Signed)
Assessment: Patient has financial difficulties, limited income and savings. He is unsure if he will be able to get aide because he is earning a minimal amount. However, at present, does not have any insurance coverage or aide - therefore, is having to pay large clinic bills out of pocket.  Plan:      Will refer to [facility], to evaluate if he may qualify at least for partial coverage of orange card.  Will attempt to be as cost effective as possible for his labs/ studies.

## 2012-11-02 NOTE — Assessment & Plan Note (Addendum)
Assessment: Patient has multiple small opened lesions on his bilateral lower extremities that are not draining at this time. May need skin biopsy in the future, to evaluate if possible relationship to TB scrofula? He will need wound care once financial situation is sorted out. No indication of active infection at this time.  Plan:      Will need wound care follow-up soon (once financial issues sorted out).  Consider skin biopsy in the future (after again financial issues sorted out).  Reviewed red flag symptoms that should prompt reevaluation immediately.

## 2012-11-02 NOTE — Assessment & Plan Note (Signed)
Pertinent Data: BP Readings from Last 3 Encounters:  10/30/12 150/81  10/27/12 158/86  10/14/12 [id number]    Basic Metabolic Panel:    Component Value Date/Time   NA 139 10/30/2012 1609   K 4.4 10/30/2012 1609   CL 105 10/30/2012 1609   CO2 28 10/30/2012 1609   BUN 20 10/30/2012 1609   CREATININE 1.04 10/30/2012 1609   CREATININE 0.53 06/29/2009 1411   GLUCOSE 193* 10/30/2012 1609   CALCIUM 8.8 10/30/2012 1609    Assessment: Disease Control:    Progress toward goals:    Barriers to meeting goals: no barriers, has just had ACE-I and Lasix started 3 days ago, therefore, likely does not completely reflect full effect of these medications.      Patient is compliant most of the time with prescribed medications.   Plan:  continue current medications  Educational resources provided:   Fluid restriction to 1500-2000 mL daily.  Self management tools provided:

## 2012-11-06 ENCOUNTER — Encounter: Payer: Self-pay | Admitting: Internal Medicine

## 2012-11-06 ENCOUNTER — Ambulatory Visit (INDEPENDENT_AMBULATORY_CARE_PROVIDER_SITE_OTHER): Payer: Self-pay | Admitting: Internal Medicine

## 2012-11-06 VITALS — BP 157/95 | HR 89 | Temp 97.4°F | Ht 65.0 in | Wt 160.3 lb

## 2012-11-06 DIAGNOSIS — N5089 Other specified disorders of the male genital organs: Secondary | ICD-10-CM

## 2012-11-06 DIAGNOSIS — R739 Hyperglycemia, unspecified: Secondary | ICD-10-CM

## 2012-11-06 DIAGNOSIS — E8809 Other disorders of plasma-protein metabolism, not elsewhere classified: Secondary | ICD-10-CM

## 2012-11-06 DIAGNOSIS — E1169 Type 2 diabetes mellitus with other specified complication: Secondary | ICD-10-CM

## 2012-11-06 DIAGNOSIS — Z79899 Other long term (current) drug therapy: Secondary | ICD-10-CM

## 2012-11-06 DIAGNOSIS — I872 Venous insufficiency (chronic) (peripheral): Secondary | ICD-10-CM

## 2012-11-06 DIAGNOSIS — E778 Other disorders of glycoprotein metabolism: Secondary | ICD-10-CM

## 2012-11-06 DIAGNOSIS — I8310 Varicose veins of unspecified lower extremity with inflammation: Secondary | ICD-10-CM

## 2012-11-06 DIAGNOSIS — E1129 Type 2 diabetes mellitus with other diabetic kidney complication: Secondary | ICD-10-CM

## 2012-11-06 DIAGNOSIS — I10 Essential (primary) hypertension: Secondary | ICD-10-CM

## 2012-11-06 DIAGNOSIS — R7989 Other specified abnormal findings of blood chemistry: Secondary | ICD-10-CM

## 2012-11-06 DIAGNOSIS — N058 Unspecified nephritic syndrome with other morphologic changes: Secondary | ICD-10-CM

## 2012-11-06 DIAGNOSIS — R809 Proteinuria, unspecified: Secondary | ICD-10-CM

## 2012-11-06 LAB — COMPREHENSIVE METABOLIC PANEL
ALT: 23 U/L (ref 0–53)
CO2: 32 mEq/L (ref 19–32)
Calcium: 8.8 mg/dL (ref 8.4–10.5)
Chloride: 101 mEq/L (ref 96–112)
Creat: 0.82 mg/dL (ref 0.50–1.35)
Glucose, Bld: 168 mg/dL — ABNORMAL HIGH (ref 70–99)
Sodium: 140 mEq/L (ref 135–145)
Total Protein: 6.5 g/dL (ref 6.0–8.3)

## 2012-11-06 LAB — PRO B NATRIURETIC PEPTIDE: Pro B Natriuretic peptide (BNP): 1474 pg/mL — ABNORMAL HIGH (ref <126)

## 2012-11-06 LAB — LIPID PANEL
Total CHOL/HDL Ratio: 2.3 Ratio
VLDL: 10 mg/dL (ref 0–40)

## 2012-11-06 LAB — GLUCOSE, CAPILLARY: Glucose-Capillary: 151 mg/dL — ABNORMAL HIGH (ref 70–99)

## 2012-11-06 MED ORDER — METOPROLOL TARTRATE 25 MG PO TABS: 25.0000 mg | ORAL_TABLET | Freq: Two times a day (BID) | ORAL | Status: DC

## 2012-11-06 NOTE — Patient Instructions (Signed)
General Instructions: 1) Start taking metoprolol tartrate 25mg  twice a day.   Treatment Goals:  Goals (1 Years of Data) as of 11/06/2012          As of Today 10/30/12 10/27/12 10/14/12 10/07/12     Blood Pressure    . Blood Pressure < 140/90  157/95 150/81 158/86 162/98 166/96      Progress Toward Treatment Goals:  Treatment Goal 11/06/2012  Blood pressure unchanged    Self Care Goals & Plans:  Self Care Goal 11/06/2012  Manage my medications take my medicines as prescribed; refill my medications on time  Monitor my health (No Data)  Eat healthy foods drink diet soda or water instead of juice or soda; eat foods that are low in salt; eat baked foods instead of fried foods  Be physically active find time in my schedule       Care Management & Community Referrals:  Referral 11/06/2012  Referrals made for care management support none needed

## 2012-11-06 NOTE — Assessment & Plan Note (Addendum)
His creatinine increased from 0.53 in 2010 to 1.04 this year and gross proteinuria with an albumin to creatinine ratio of 2946.9 clearly demonstrates nephropathy.  I think the etiology of this is untreated and long-standing hypertension and diabetes mellitus. I am repeating a urine protein to creatinine ratio today for trending purposes as well as a comprehensive metabolic panel. Given his blood pressure and hemoglobin A1c, I think it is safe to assume that this nephropathy is from hypertension and diabetes. As such, aggressive treatment of these conditions will be undertaken. He is on lisinopril 20 mg already in hopes of controlling his proteinuria; he may require addition of verapamil or another calcium channel blocker. - Repeating urine protein to creatinine ratio - Repeating comprehensive metabolic - Lipid panel ordered  Lab Results  Component Value Date   HGBA1C 8.9 11/06/2012    ADDENDUM: Urine protein to creatinine ratio has decreased but remains elevated since starting an ACE inhibitor. Continue trending will be necessary. Based on the laboratory data gathered at this visit, I think it is clear that long-standing and untreated diabetes mellitus is at least in part to blame for this patient's nephropathy.  Protein to creatinine ratio Component Value Date/Time   Prot-Cr ratio 6.48 11/06/2012 1635   Lipid Panel  Component Value Date/Time   CHOL 193 11/06/2012 1635   TRIG 49 11/06/2012 1635   HDL 83 11/06/2012 1635   LDLCALC 100* 11/06/2012 1635   CMP  Component Value Date/Time   NA 140 11/06/2012 1635   K 4.3 11/06/2012 1635   CL 101 11/06/2012 1635   CO2 32 11/06/2012 1635   GLUCOSE 168* 11/06/2012 1635   BUN 24* 11/06/2012 1635   CREATININE 0.82 11/06/2012 1635   CALCIUM 8.8 11/06/2012 1635   PROT 6.5 11/06/2012 1635   ALBUMIN 2.4* 11/06/2012 1635   AST 26 11/06/2012 1635   ALT 23 11/06/2012 1635   ALKPHOS 124* 11/06/2012 1635   BILITOT 0.5 11/06/2012 1635

## 2012-11-06 NOTE — Assessment & Plan Note (Addendum)
Most likely multifactorial. Gross proteinuria is certainly contributing; a component of dilution from heart failure may be contributing as well. I am repeating a urine protein to creatinine ratio and a comprehensive metabolic panel today. He is already on an ACE inhibitor with fosinopril 20 mg; addition of a calcium channel blocker may be needed in the future.

## 2012-11-06 NOTE — Assessment & Plan Note (Signed)
This patient may indeed have venous insufficiency of the lower extremities, however I think an additional process is contributing to his lower extremity edema. Heart, liver, or renal failure may be contributing.

## 2012-11-06 NOTE — Assessment & Plan Note (Addendum)
BP Readings from Last 3 Encounters:  11/06/12 157/95  10/30/12 150/81  10/27/12 158/86    Lab Results  Component Value Date   NA 139 10/30/2012   K 4.4 10/30/2012   CREATININE 1.04 10/30/2012    Assessment:  Blood pressure control: moderately elevated  Progress toward BP goal:  unchanged  Comments: This patient's creatinine has doubled since it was last checked 3 years ago and he is now demonstrating significant proteinuria. I'm afraid that untreated hypertension may be partly to blame for this, so an aggressive approach is appropriate.  Plan:  Medications:  I will continue treatment with furosemide 60 mg twice a day and lisinopril 20 mg daily. I am also adding metoprolol tartrate 25 mg twice a day today.  Educational resources provided: handout  - Continue furosemide 60 mg twice a day - Continue lisinopril 20 mg daily - Start metoprolol tartrate 25 mg twice a day

## 2012-11-06 NOTE — Assessment & Plan Note (Signed)
I think this is a sequelae of the process that is causing his lower extremity edema. I do not believe he is suffering from hepatic failure, but we are checking a comprehensive metabolic panel again today. I think he has clearly demonstrated a degree of renal insufficiency with a 50% increase in creatinine since 2010 and now gross proteinuria. His elevated proBNP today also raises the suspicion of heart failure. With long-standing hypertension this would not be a surprise. Despite his financial limitations, I think it is imperative that we order an echocardiogram to assess his heart function. - order an echocardiogram at his next visit

## 2012-11-06 NOTE — Assessment & Plan Note (Addendum)
Lab Results  Component Value Date   HGBA1C 8.9 11/06/2012     Assessment: The day prior nonfasting blood glucose levels were suspicious for diabetes. Hemoglobin A1c today of 8.9 confirms this diagnosis. Long-standing, untreated diabetes is likely to blame, at least in part, to his kidney injury.  Plan: The patient has been scheduled for a followup appointment in one month. At that time, further education on diabetes, its complications, and its treatments need to be discussed with the patient.  - Diagnosed at this visit, no treatment

## 2012-11-06 NOTE — Progress Notes (Signed)
  Subjective:    Patient ID: Joshua [name], male    DOB: 1951/09/02, 61 y.o.   MRN: [id number]  HPI: This is a 61 year old man here for followup. He established care with me in November. Prior to this he had very little health care attention. 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 At his first visit, his principal complaint was lower extremity swelling which appeared consistent with venous insufficiency. At a followup visit, he was found to have large amounts of proteinuria (microalbumin to creatinine ratio of 2946.9) and he had developed scrotal edema. He has also been diagnosed with since establishing with [place]. Pharmacotherapy has been advanced over his last few visits, and he is now on furosemide 60 mg twice a day and lisinopril 20 mg daily. He feels like the swelling has improved greatly in his legs and to a lesser degree in his scrotum. He actually is not sure why he had to come to this appointment today. He denies orthopnea, sleeping on one pillow at night; he also denies chest pain and palpitations. He does report some exertional dyspnea.    Review of Systems Constitutional: Negative, no fevers or chills HEENT: Negative Eyes: He does report some blurred vision, denies diplopia Respiratory: Exertional dyspnea only Cardiac: Denies chest pain, orthopnea, paroxysmal nocturnal dyspnea, and palpitations; reports exertional dyspnea Gastrointestinal: Denies abdominal pain, diarrhea, constipation Genitourinary: Scrotal swelling, but denies urinary symptoms Skin: Weeping ulcerations on anterior shins, improving     Objective:   Physical Exam GENERAL: Overweight; no acute distress LUNGS: clear to auscultation bilaterally, no rales, normal work of breathing HEART: normal rate and regular rhythm; normal S1 and S2 without S3 or S4; no murmurs, rubs, or clicks PULSES: radial 2+ and symmetric ABDOMEN: obese,  non-tender, normal bowel sounds, no hepatomegaly, no masses SKIN: Compression stockings and dressings cover the anterior shins where he has weeping ulcerations associated with this lower extremity swelling, otherwise skin is warm, dry, and intact EXTREMITIES: 1+ pitting edema appreciated through the compression stockings on his anterior shins bilaterally, no clubbing or cyanosis  Filed Vitals:   11/06/12 1513  BP: 157/95  Pulse: 89  Temp: 97.4 F (36.3 C)   BP Readings from Last 3 Encounters:  11/06/12 157/95  10/30/12 150/81  10/27/12 158/86   Basename 11/06/12 1634  GLUCAP 151*   Lab Results  Component Value Date   HGBA1C 8.9* 11/06/2012   BNP    Component Value Date/Time   PROBNP 1474* 11/06/2012 1036         Assessment & Plan:

## 2012-11-06 NOTE — Assessment & Plan Note (Addendum)
Her BNP is elevated today at 1474, which may indicate heart failure. If this is the case, hepatic venous congestion may be to blame for this transaminasemia. Regardless, I will check a comprehensive metabolic panel today to trend his liver function tests. Abdominal ultrasound may be required in the future, but cost is a concern. - Repeating a comprehensive metabolic panel today  ADDENDUM: Liver function tests have normalized. The previous elevation may have been from venous congestion, stemming from heart failure. I think it is safe to say that liver failure is not contributing to his edema.  LFTs  Component Value Date/Time   PROT 6.5 11/06/2012 1635   ALBUMIN 2.4* 11/06/2012 1635   AST 26 11/06/2012 1635   ALT 23 11/06/2012 1635   ALKPHOS 124* 11/06/2012 1635   BILITOT 0.5 11/06/2012 1635

## 2012-11-07 LAB — PROTEIN / CREATININE RATIO, URINE: Creatinine, Urine: 50.3 mg/dL

## 2012-11-10 ENCOUNTER — Other Ambulatory Visit (INDEPENDENT_AMBULATORY_CARE_PROVIDER_SITE_OTHER): Payer: Self-pay

## 2012-11-10 ENCOUNTER — Other Ambulatory Visit: Payer: Self-pay | Admitting: Internal Medicine

## 2012-11-10 DIAGNOSIS — Z1211 Encounter for screening for malignant neoplasm of colon: Secondary | ICD-10-CM

## 2012-11-10 LAB — POC HEMOCCULT BLD/STL (OFFICE/1-CARD/DIAGNOSTIC): Fecal Occult Blood, POC: NEGATIVE

## 2012-11-24 ENCOUNTER — Ambulatory Visit
Admission: RE | Admit: 2012-11-24 | Discharge: 2012-11-24 | Disposition: A | Discharge status: Home / Self Care | Payer: No Typology Code available for payment source | Source: Ambulatory Visit | Attending: Infectious Diseases | Admitting: Infectious Diseases

## 2012-11-24 ENCOUNTER — Other Ambulatory Visit: Payer: Self-pay | Admitting: Infectious Diseases

## 2012-11-24 DIAGNOSIS — R7611 Nonspecific reaction to tuberculin skin test without active tuberculosis: Secondary | ICD-10-CM

## 2012-12-04 ENCOUNTER — Ambulatory Visit: Payer: Self-pay

## 2012-12-04 ENCOUNTER — Encounter: Payer: Self-pay | Admitting: Internal Medicine

## 2012-12-04 ENCOUNTER — Ambulatory Visit (INDEPENDENT_AMBULATORY_CARE_PROVIDER_SITE_OTHER): Payer: PRIVATE HEALTH INSURANCE | Admitting: Internal Medicine

## 2012-12-04 ENCOUNTER — Ambulatory Visit (INDEPENDENT_AMBULATORY_CARE_PROVIDER_SITE_OTHER): Payer: PRIVATE HEALTH INSURANCE | Admitting: Dietician

## 2012-12-04 VITALS — BP 152/88 | HR 75 | Temp 98.1°F | Wt 172.4 lb

## 2012-12-04 DIAGNOSIS — I10 Essential (primary) hypertension: Secondary | ICD-10-CM

## 2012-12-04 DIAGNOSIS — R600 Localized edema: Secondary | ICD-10-CM

## 2012-12-04 DIAGNOSIS — R609 Edema, unspecified: Secondary | ICD-10-CM

## 2012-12-04 DIAGNOSIS — E119 Type 2 diabetes mellitus without complications: Secondary | ICD-10-CM

## 2012-12-04 DIAGNOSIS — R809 Proteinuria, unspecified: Secondary | ICD-10-CM

## 2012-12-04 DIAGNOSIS — E778 Other disorders of glycoprotein metabolism: Secondary | ICD-10-CM

## 2012-12-04 MED ORDER — METFORMIN HCL 500 MG PO TABS: 500.0000 mg | ORAL_TABLET | Freq: Every day | ORAL | Status: DC

## 2012-12-04 MED ORDER — FREESTYLE SYSTEM KIT: 1.0000 | PACK | Status: DC | PRN

## 2012-12-04 MED ORDER — FUROSEMIDE 40 MG PO TABS: 60.0000 mg | ORAL_TABLET | Freq: Two times a day (BID) | ORAL | Status: DC

## 2012-12-04 MED ORDER — METFORMIN HCL 500 MG PO TABS: 500.0000 mg | ORAL_TABLET | Freq: Two times a day (BID) | ORAL | Status: DC

## 2012-12-04 MED ORDER — LISINOPRIL 20 MG PO TABS: 20.0000 mg | ORAL_TABLET | Freq: Every day | ORAL | Status: DC

## 2012-12-04 NOTE — Assessment & Plan Note (Signed)
Patient denies having a cardiac history. Has had chronic lower extremity edema and dermatitis for some time.  -Would like to order an echocardiogram, however, patient would like to wait until his insurance is verified or he has orange card. Order at next visit if possible.

## 2012-12-04 NOTE — Assessment & Plan Note (Addendum)
Recently started on metoprolol 25 mg twice a day in addition to his Lasix 60 mg twice a day and lisinopril 20 mg. Patient's blood pressure remains elevated at today's visit, 153/87. He states he is taking his medications as prescribed. Decided not to change any of his blood pressure medicines at this visit as I felt it was too much for the patient and at one time with his new diagnosis of diabetes and initiation of metformin.   -Blood pressure check in one month during followup visit -Room to go up on the metoprolol -Check BMP today

## 2012-12-04 NOTE — Patient Instructions (Addendum)
Please take your new medication, metformin, as instructed.  You will need to followup in our clinic in one month.  Type 2 Diabetes Diabetes is a long-lasting (chronic) disease. One or both of the following happen with type 2 diabetes:    The pancreas does not make enough of a hormone called insulin.   The body has trouble using the insulin that is made.  HOME CARE  Check your blood sugar (glucose) once a day, or as told by your doctor.   Take all medicine as told by your doctor.   Do not smoke.   Eat healthy foods. Weight loss can help your diabetes.   Learn about low blood sugar (hypoglycemia). Know how to treat it.   Get your eyes checked on a regular basis.   Get a physical exam every year. Get your blood pressure checked. Get your blood and pee (urine) tested.   Wear a necklace or bracelet that says you have diabetes.   Check your feet every night for cuts, sores, blisters, and redness. Tell your doctor if you have problems.  GET HELP RIGHT AWAY IF:  You have trouble keeping your blood sugar in target range.   You have problems with your medicines.   You are sick and not getting better after 24 hours.   You have a sore or wound that is not healing.   You have vision problems or changes.   You have a fever.  MAKE SURE YOU:  Understand these instructions.   Will watch your condition.   Will get help right away if you are not doing well or get worse.  Document Released: 08/20/2008 Document Revised: 02/03/2012 Document Reviewed: 04/29/2011 [facility] Patient Information 2013 [place].   Diabetes, Frequently Asked Questions WHAT IS DIABETES? Most of the food we eat is turned into glucose (sugar). Our bodies use it for energy. The pancreas makes a hormone called insulin. It helps glucose get into the cells of our bodies. When you have diabetes, your body either does not make enough insulin or cannot use its own insulin as well as it should. This causes sugars  to build up in your blood. WHAT ARE THE SYMPTOMS OF DIABETES?  Frequent urination.   Excessive thirst.   Unexplained weight loss.   Extreme hunger.   Blurred vision.   Tingling or numbness in hands or feet.   Feeling very tired much of the time.   Dry, itchy skin.   Sores that are slow to heal.   Yeast infections.  WHAT ARE THE TYPES OF DIABETES? Type 1 Diabetes   About 10% of affected people have this type.   Usually occurs before the age of 45.   Usually occurs in thin to normal weight people.  Type 2 Diabetes  About 90% of affected people have this type.   Usually occurs after the age of 59.   Usually occurs in overweight people.   More likely to have:   A family history of diabetes.   A history of diabetes during pregnancy (gestational diabetes).   High blood pressure.   High cholesterol and triglycerides.  Gestational Diabetes  Occurs in about 4% of pregnancies.   Usually goes away after the baby is born.   More likely to occur in women with:   Family history of diabetes.   Previous gestational diabetes.   Obese.   Over 23 years old.  WHAT IS PRE-DIABETES? Pre-diabetes means your blood glucose is higher than normal, but lower than the  diabetes range. It also means you are at risk of getting type 2 diabetes and heart disease. If you are told you have pre-diabetes, have your blood glucose checked again in 1 to 2 years. WHAT IS THE TREATMENT FOR DIABETES? Treatment is aimed at keeping blood glucose near normal levels at all times. Learning how to manage this yourself is important in treating diabetes. Depending on the type of diabetes you have, your treatment will include one or more of the following:  Monitoring your blood glucose.   Meal planning.   Exercise.   Oral medicine (pills) or insulin.  CAN DIABETES BE PREVENTED? With type 1 diabetes, prevention is more difficult, because the triggers that cause it are not yet known. With type 2  diabetes, prevention is more likely, with lifestyle changes:  Maintain a healthy weight.   Eat healthy.   Exercise.  IS THERE A CURE FOR DIABETES? No, there is no cure for diabetes. There is a lot of research going on that is looking for a cure, and progress is being made. Diabetes can be treated and controlled. People with diabetes can manage their diabetes and lead normal, active lives. SHOULD I BE TESTED FOR DIABETES? If you are at least 62 years old, you should be tested for diabetes. You should be tested again every 3 years. If you are 79 or older and overweight, you may want to get tested more often. If you are younger than 90, overweight, and have one or more of the following risk factors, you should be tested:  Family history of diabetes.   Inactive lifestyle.   High blood pressure.  WHAT ARE SOME OTHER SOURCES FOR INFORMATION ON DIABETES? The following organizations may help in your search for more information on diabetes: National Diabetes Education Program (NDEP) Internet: BloggerBowl.es American Diabetes Association Internet: http://www.diabetes.org   Juvenile Diabetes Foundation International Internet: AffordableSalon.es Document Released: 11/14/2003 Document Revised: 02/03/2012 Document Reviewed: 09/08/2009 [facility] Patient Information 2013 [place].   Diabetes Meal Planning Guide The diabetes meal planning guide is a tool to help you plan your meals and snacks. It is important for people with diabetes to manage their blood glucose (sugar) levels. Choosing the right foods and the right amounts throughout your day will help control your blood glucose. Eating right can even help you improve your blood pressure and reach or maintain a healthy weight. CARBOHYDRATE COUNTING MADE EASY When you eat carbohydrates, they turn to sugar. This raises your blood glucose level. Counting carbohydrates can help you control this level so you feel better. When you  plan your meals by counting carbohydrates, you can have more flexibility in what you eat and balance your medicine with your food intake. Carbohydrate counting simply means adding up the total amount of carbohydrate grams in your meals and snacks. Try to eat about the same amount at each meal. Foods with carbohydrates are listed below. Each portion below is 1 carbohydrate serving or 15 grams of carbohydrates. Ask your dietician how many grams of carbohydrates you should eat at each meal or snack. Grains and Starches  1 slice bread.    English muffin or hotdog/hamburger bun.    cup cold cereal (unsweetened).   cup cooked pasta or rice.    cup starchy vegetables (corn, potatoes, peas, beans, winter squash).   1 tortilla (6 inches).    bagel.   1 waffle or pancake (size of a CD).    cup cooked cereal.   4 to 6 small crackers.  *Whole  grain is recommended. Fruit  1 cup fresh unsweetened berries, melon, papaya, pineapple.   1 small fresh fruit.    banana or mango.    cup fruit juice (4 oz unsweetened).    cup canned fruit in natural juice or water.   2 tbs dried fruit.   12 to 15 grapes or cherries.  Milk and Yogurt  1 cup fat-free or 1% milk.   1 cup soy milk.   6 oz light yogurt with sugar-free sweetener.   6 oz low-fat soy yogurt.   6 oz plain yogurt.  Vegetables  1 cup raw or  cup cooked is counted as 0 carbohydrates or a "free" food.   If you eat 3 or more servings at 1 meal, count them as 1 carbohydrate serving.  Other Carbohydrates   oz chips or pretzels.    cup ice cream or frozen yogurt.    cup sherbet or sorbet.   2 inch square cake, no frosting.   1 tbs honey, sugar, jam, jelly, or syrup.   2 small cookies.   3 squares of graham crackers.   3 cups popcorn.   6 crackers.   1 cup broth-based soup.   Count 1 cup casserole or other mixed foods as 2 carbohydrate servings.   Foods with less than 20 calories in a serving may be  counted as 0 carbohydrates or a "free" food.  You may want to purchase a book or computer software that lists the carbohydrate gram counts of different foods. In addition, the nutrition facts panel on the labels of the foods you eat are a good source of this information. The label will tell you how big the serving size is and the total number of carbohydrate grams you will be eating per serving. Divide this number by 15 to obtain the number of carbohydrate servings in a portion. Remember, 1 carbohydrate serving equals 15 grams of carbohydrate. SERVING SIZES Measuring foods and serving sizes helps you make sure you are getting the right amount of food. The list below tells how big or small some common serving sizes are.  1 oz.........4 stacked dice.   3 oz........Marland KitchenDeck of cards.   1 tsp.......Marland KitchenTip of little finger.   1 tbs......Marland KitchenMarland KitchenThumb.   2 tbs.......Marland KitchenGolf ball.    cup......Marland KitchenHalf of a fist.   1 cup.......Marland KitchenA fist.  SAMPLE DIABETES MEAL PLAN Below is a sample meal plan that includes foods from the grain and starches, dairy, vegetable, fruit, and meat groups. A dietician can individualize a meal plan to fit your calorie needs and tell you the number of servings needed from each food group. However, controlling the total amount of carbohydrates in your meal or snack is more important than making sure you include all of the food groups at every meal. You may interchange carbohydrate containing foods (dairy, starches, and fruits). The meal plan below is an example of a 2000 calorie diet using carbohydrate counting. This meal plan has 17 carbohydrate servings. Breakfast  1 cup oatmeal (2 carb servings).    cup light yogurt (1 carb serving).   1 cup blueberries (1 carb serving).    cup almonds.  Snack  1 large apple (2 carb servings).   1 low-fat string cheese stick.  Lunch  Chicken breast salad.   1 cup spinach.    cup chopped tomatoes.   2 oz chicken breast, sliced.   2 tbs  low-fat [place] dressing.   12 whole-wheat crackers (2 carb servings).   12 to  15 grapes (1 carb serving).   1 cup low-fat milk (1 carb serving).  Snack  1 cup carrots.    cup hummus (1 carb serving).  Dinner  3 oz broiled salmon.   1 cup brown rice (3 carb servings).  Snack  1  cups steamed broccoli (1 carb serving) drizzled with 1 tsp olive oil and lemon juice.   1 cup light pudding (2 carb servings).  DIABETES MEAL PLANNING WORKSHEET Your dietician can use this worksheet to help you decide how many servings of foods and what types of foods are right for you.   BREAKFAST Food Group and Servings / Carb Servings Grain/Starches __________________________________ Dairy __________________________________________ Vegetable ______________________________________ Fruit ___________________________________________ Meat __________________________________________ Fat ____________________________________________ LUNCH Food Group and Servings / Carb Servings Grain/Starches ___________________________________ Dairy ___________________________________________ Fruit ____________________________________________ Meat ___________________________________________ Fat _____________________________________________ Joshua [name] Food Group and Servings / Carb Servings Grain/Starches ___________________________________ Dairy ___________________________________________ Fruit ____________________________________________ Meat ___________________________________________ Fat _____________________________________________ SNACKS Food Group and Servings / Carb Servings Grain/Starches ___________________________________ Dairy ___________________________________________ Vegetable _______________________________________ Fruit ____________________________________________ Meat ___________________________________________ Fat _____________________________________________ DAILY TOTALS Starches  _________________________ Vegetable ________________________ Fruit ____________________________ Dairy ____________________________ Meat ____________________________ Fat ______________________________ Document Released: 08/08/2005 Document Revised: 02/03/2012 Document Reviewed: 06/19/2009 [employer] Patient Information 2013 [place], [place].   Diabetes Meal Planning Guide The diabetes meal planning guide is a tool to help you plan your meals and snacks. It is important for people with diabetes to manage their blood glucose (sugar) levels. Choosing the right foods and the right amounts throughout your day will help control your blood glucose. Eating right can even help you improve your blood pressure and reach or maintain a healthy weight. CARBOHYDRATE COUNTING MADE EASY When you eat carbohydrates, they turn to sugar. This raises your blood glucose level. Counting carbohydrates can help you control this level so you feel better. When you plan your meals by counting carbohydrates, you can have more flexibility in what you eat and balance your medicine with your food intake. Carbohydrate counting simply means adding up the total amount of carbohydrate grams in your meals and snacks. Try to eat about the same amount at each meal. Foods with carbohydrates are listed below. Each portion below is 1 carbohydrate serving or 15 grams of carbohydrates. Ask your dietician how many grams of carbohydrates you should eat at each meal or snack. Grains and Starches  1 slice bread.    English muffin or hotdog/hamburger bun.    cup cold cereal (unsweetened).   cup cooked pasta or rice.    cup starchy vegetables (corn, potatoes, peas, beans, winter squash).   1 tortilla (6 inches).    bagel.   1 waffle or pancake (size of a CD).    cup cooked cereal.   4 to 6 small crackers.  *Whole grain is recommended. Fruit  1 cup fresh unsweetened berries, melon, papaya, pineapple.   1 small fresh fruit.     banana or mango.    cup fruit juice (4 oz unsweetened).    cup canned fruit in natural juice or water.   2 tbs dried fruit.   12 to 15 grapes or cherries.  Milk and Yogurt  1 cup fat-free or 1% milk.   1 cup soy milk.   6 oz light yogurt with sugar-free sweetener.   6 oz low-fat soy yogurt.   6 oz plain yogurt.  Vegetables  1 cup raw or  cup cooked is counted as 0 carbohydrates or a "free" food.   If you eat 3 or more servings at  1 meal, count them as 1 carbohydrate serving.  Other Carbohydrates   oz chips or pretzels.    cup ice cream or frozen yogurt.    cup sherbet or sorbet.   2 inch square cake, no frosting.   1 tbs honey, sugar, jam, jelly, or syrup.   2 small cookies.   3 squares of graham crackers.   3 cups popcorn.   6 crackers.   1 cup broth-based soup.   Count 1 cup casserole or other mixed foods as 2 carbohydrate servings.   Foods with less than 20 calories in a serving may be counted as 0 carbohydrates or a "free" food.  You may want to purchase a book or computer software that lists the carbohydrate gram counts of different foods. In addition, the nutrition facts panel on the labels of the foods you eat are a good source of this information. The label will tell you how big the serving size is and the total number of carbohydrate grams you will be eating per serving. Divide this number by 15 to obtain the number of carbohydrate servings in a portion. Remember, 1 carbohydrate serving equals 15 grams of carbohydrate. SERVING SIZES Measuring foods and serving sizes helps you make sure you are getting the right amount of food. The list below tells how big or small some common serving sizes are.  1 oz.........4 stacked dice.   3 oz........Marland KitchenDeck of cards.   1 tsp.......Marland KitchenTip of little finger.   1 tbs......Marland KitchenMarland KitchenThumb.   2 tbs.......Marland KitchenGolf ball.    cup......Marland KitchenHalf of a fist.   1 cup.......Marland KitchenA fist.  SAMPLE DIABETES MEAL PLAN Below is a  sample meal plan that includes foods from the grain and starches, dairy, vegetable, fruit, and meat groups. A dietician can individualize a meal plan to fit your calorie needs and tell you the number of servings needed from each food group. However, controlling the total amount of carbohydrates in your meal or snack is more important than making sure you include all of the food groups at every meal. You may interchange carbohydrate containing foods (dairy, starches, and fruits). The meal plan below is an example of a 2000 calorie diet using carbohydrate counting. This meal plan has 17 carbohydrate servings. Breakfast  1 cup oatmeal (2 carb servings).    cup light yogurt (1 carb serving).   1 cup blueberries (1 carb serving).    cup almonds.  Snack  1 large apple (2 carb servings).   1 low-fat string cheese stick.  Lunch  Chicken breast salad.   1 cup spinach.    cup chopped tomatoes.   2 oz chicken breast, sliced.   2 tbs low-fat [place] dressing.   12 whole-wheat crackers (2 carb servings).   12 to 15 grapes (1 carb serving).   1 cup low-fat milk (1 carb serving).  Snack  1 cup carrots.    cup hummus (1 carb serving).  Dinner  3 oz broiled salmon.   1 cup brown rice (3 carb servings).  Snack  1  cups steamed broccoli (1 carb serving) drizzled with 1 tsp olive oil and lemon juice.   1 cup light pudding (2 carb servings).  DIABETES MEAL PLANNING WORKSHEET Your dietician can use this worksheet to help you decide how many servings of foods and what types of foods are right for you.   BREAKFAST Food Group and Servings / Carb Servings Grain/Starches __________________________________ Dairy __________________________________________ Vegetable ______________________________________ Fruit ___________________________________________ Meat __________________________________________ Fat ____________________________________________ LUNCH Food Group and  Servings /  Carb Servings Grain/Starches ___________________________________ Dairy ___________________________________________ Fruit ____________________________________________ Meat ___________________________________________ Fat _____________________________________________ Joshua [name] Food Group and Servings / Carb Servings Grain/Starches ___________________________________ Dairy ___________________________________________ Fruit ____________________________________________ Meat ___________________________________________ Fat _____________________________________________ SNACKS Food Group and Servings / Carb Servings Grain/Starches ___________________________________ Dairy ___________________________________________ Vegetable _______________________________________ Fruit ____________________________________________ Meat ___________________________________________ Fat _____________________________________________ DAILY TOTALS Starches _________________________ Vegetable ________________________ Fruit ____________________________ Dairy ____________________________ Meat ____________________________ Fat ______________________________ Document Released: 08/08/2005 Document Revised: 02/03/2012 Document Reviewed: 06/19/2009 [employer] Patient Information 2013 [place], [place].

## 2012-12-04 NOTE — Progress Notes (Signed)
Internal Medicine Clinic Visit    HPI:  Joshua [name] is a 62 y.o. year old male with a history of prolonged absence from health care providers, positive TB test, chronic lower extremity edema, hypertension, and recent diagnosis of type 2 diabetes (A1c 8.9 on 11/06/2012), who presents for followup.  He presents primarily to discuss the new diagnosis of diabetes. Doesn't have any polyuria or polydipsia. Does have changes in his vision. He states that in his culture, he eats a lot of carbohydrates. He asks about how to improve his diet.  Has chronic LE edema, no worse than before. Has been using the compression hose. Says they legs get tight sometimes.  Patient has been taking his anti-tuberculosis medications as prescribed.  Patient did tell the nurse that his mood has been slightly depressed recently. When probed, he did state that he had previously had formed a plan of hanging himself. He does reassure me that this was in the past and this was not a current thought. He denies actively having suicidal thoughts and he actually states that his mood, though sad at times, his overall pretty good. He states that he wants to be around for his grandchildren.  Denies chest pain. Some SOB with activity on the way here, had to stop and rest on the way here twice. No fever or chills.   Past Medical History  Diagnosis Date  . 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 Hypertension 10/14/2012  . Type 2 diabetes mellitus   . Diabetic nephropathy with proteinuria     Past Surgical History  Procedure Date  . Knee arthroscopy 2010    3 times, right knee     ROS:  A complete review of systems was otherwise negative, except as noted in the HPI.  Allergies: Review of patient's allergies indicates no known allergies.  Medications: Current Outpatient Prescriptions  Medication Sig Dispense Refill  . furosemide (LASIX) 40 MG tablet Take 1.5 tablets (60 mg total) by mouth 2  (two) times daily.  30 tablet  0  . Horse Chestnut 300 MG TABS Take 1 tablet (300 mg total) by mouth 2 (two) times daily.      Marland Kitchen isoniazid (NYDRAZID) 300 MG tablet Take 900 mg by mouth 2 (two) times a week.      Marland Kitchen lisinopril (PRINIVIL,ZESTRIL) 20 MG tablet Take 1 tablet (20 mg total) by mouth daily.  30 tablet  1  . metoprolol tartrate (LOPRESSOR) 25 MG tablet Take 1 tablet (25 mg total) by mouth 2 (two) times daily.  60 tablet  2  . pyridOXINE (VITAMIN B-6) 50 MG tablet Take 50 mg by mouth 2 (two) times a week.      . rifampin (RIFADIN) 300 MG capsule Take 600 mg by mouth 2 (two) times a week.        History   Social History  . Marital Status: Single    Spouse Name: N/A    Number of Children: 5  . 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 Smoking status: Former Smoker -- 20 years    Types: Cigarettes    Quit date: 11/25/1992  . Smokeless tobacco: Never Used  . Alcohol Use: 7.5 oz/week    15 drink(s) per week     Comment: Rarely.  . Drug Use: No  . Sexually Active: Not on file     Comment: 09/2012: > 15 partners  in past 3 years, inconsistent condom use   Other Topics Concern  . Not on file   Social History Narrative   Has a masters degree in education    family history is not on file.  Physical Exam Blood pressure 153/87, pulse 82, weight 172 lb 6.4 oz (78.2 kg), SpO2 94.00%. General:  No acute distress, alert and oriented x 3, well-appearing  HEENT:  PERRL, EOMI, no lymphadenopathy, moist mucous membranes Cardiovascular:  Regular rate and rhythm, no murmurs Respiratory:  Clear to auscultation bilaterally, no wheezes, rales, or rhonchi. Good effort Abdomen:  Soft, nondistended, nontender, positive bowel sounds Extremities:  Wrapped in compression stockings to upper thigh. 2+ edema b/l.  Some fluid seeping through the stockings. Skin: Warm, dry Neuro: Not anxious appearing, no depressed mood, normal  affect  Labs: Lab Results  Component Value Date   CREATININE 0.82 11/06/2012   BUN 24* 11/06/2012   NA 140 11/06/2012   K 4.3 11/06/2012   CL 101 11/06/2012   CO2 32 11/06/2012   Lab Results  Component Value Date   WBC 6.1 06/29/2009   HGB 13.9 06/29/2009   HCT 41.6 06/29/2009   MCV 88.0 06/29/2009   PLT 201 06/29/2009      Assessment and Plan:   FOLLOWUP: Joshua [name] will follow back up in our clinic in approximately 3-4 weeks. Joshua [name] knows to call out clinic in the meantime with any questions or new issues.

## 2012-12-04 NOTE — Assessment & Plan Note (Addendum)
Patient recently diagnosed with diabetes. Last hemoglobin A1c was 8.9 on 11/06/2012. He was not started on treatment at that time. At this visit, the new diagnosis of diabetes was discussed. Patient appears to have a low healthcare literacy. We discussed initiation of Glucophage and patient is agreeable to this plan. We also discussed how to control blood sugars with diet modification. He will have a brief visit with Butch Penny, our diabetes educator, at this visit to discuss the basics of diabetes. We will also recommend buying a glucometer over-the-counter, however, patient does not have insurance and finances may be a barrier. We will educate the patient on how to use his glucometer to check his blood sugars. We recommended checking his blood sugar several times a week, first thing in the morning, before eating. He is to write this down in a notebook and bring to his next visit.  -Prescription given for metformin 500 mg twice a day with meals -Patient to buy over-the-counter glucometer and check fasting blood sugars several times per week, according in notebook -Will try to get in touch with patient in 2 weeks and depending on his reported fasting blood sugars, will likely increase metformin to 1000 twice a day -Patient will likely need additional agent, perhaps sulfonylureas, but we will start with metformin for now -Followup in clinic in one month -Full visit with Butch Penny at next visit  Addendum: Patient cannot afford meter, and he recently acquired then unknown type of insurance. Therefore, we will initiate meter at next visit.

## 2012-12-05 LAB — BASIC METABOLIC PANEL WITH GFR
CO2: 28 mEq/L (ref 19–32)
Glucose, Bld: 197 mg/dL — ABNORMAL HIGH (ref 70–99)
Potassium: 4 mEq/L (ref 3.5–5.3)
Sodium: 138 mEq/L (ref 135–145)

## 2012-12-22 DIAGNOSIS — E113599 Type 2 diabetes mellitus with proliferative diabetic retinopathy without macular edema, unspecified eye: Secondary | ICD-10-CM | POA: Insufficient documentation

## 2012-12-22 DIAGNOSIS — H4310 Vitreous hemorrhage, unspecified eye: Secondary | ICD-10-CM | POA: Insufficient documentation

## 2012-12-22 DIAGNOSIS — E11311 Type 2 diabetes mellitus with unspecified diabetic retinopathy with macular edema: Secondary | ICD-10-CM | POA: Insufficient documentation

## 2012-12-30 NOTE — Progress Notes (Signed)
Diabetes Self-Management Training (DSMT)  Initial Visit  12/30/2012 [name], identified by name and date of birth, is a 62 y.o. male with Type 2 Diabetes. Year of diabetes diagnosis: 10/2012  ASSESSMENT Patient concerns are Healthy Lifestyle.  There were no vitals taken for this visit. There is no height or weight on file to calculate BMI. Lab Results  Component Value Date   LDLCALC 100* 11/06/2012   Lab Results  Component Value Date   HGBA1C 8.9 11/06/2012   Family history of diabetes: No Support systems: family Special needs: None Prior DM Education: No Patients belief/attitude about diabetes: Diabetes can be controlled. Self foot exams daily: No Diabetes Complications: Nephropathy     Medications See Medications list.  Has adequate knowledge and Is interested in learning more   Exercise Plan Doing walking for 30 minutesa day.   Self-Monitoring Frequency of testing: not sure how often he is testing  Hyperglycemia: No Hypoglycemia: No   Meal Planning Limited knowledge and Interested in improving   Assessment comments: had patient watch short video about what diabetes is.  He taught back to CDE with excellent comprehension.     INDIVIDUAL DIABETES EDUCATION PLAN:  Diabetes disease state Nutrition management Physical activity and exercise Medication Monitoring Acute complication: Chronic complications _______________________________________________________________________  Intervention TOPICS COVERED TODAY:  Diabetes disease state Definition of diabetes, type 1 and 2, and the diagnosis of diabetes. Nutrition management  Role of diet in the treatment of diabetes and the relationship between the three main macronutritents and blood glucose control.  PATIENTS GOALS/PLAN (copy and paste in patient instructions so patient receives a copy): 1.  Learning Objective:       State importance of spreading food intake out over the day 2.  Behavioral  Objective:         Nutrition: To improve blood glucose control I will follow meal plan of spreading carbs out over day by eating well balanced meals at least 3 times daily Sometimes 25%  Personalized Follow-Up Plan for Ongoing Self Management Support:  [place], family and CDE visits ______________________________________________________________________   Outcomes Expected outcomes: Demonstrated interest in learning.Expect positive changes in lifestyle. Self-care Barriers: None Education material provided: showed video about  Patient to contact team via Phone if problems or questions. Time in: 1530     Time out: 1600 Future DSMT - 4-6 wks   Plyler, Butch Penny

## 2013-01-08 ENCOUNTER — Encounter: Payer: PRIVATE HEALTH INSURANCE | Admitting: Internal Medicine

## 2013-01-27 ENCOUNTER — Encounter: Payer: Self-pay | Admitting: Internal Medicine

## 2013-01-27 ENCOUNTER — Ambulatory Visit (INDEPENDENT_AMBULATORY_CARE_PROVIDER_SITE_OTHER): Payer: PRIVATE HEALTH INSURANCE | Admitting: Dietician

## 2013-01-27 ENCOUNTER — Ambulatory Visit (INDEPENDENT_AMBULATORY_CARE_PROVIDER_SITE_OTHER): Payer: PRIVATE HEALTH INSURANCE | Admitting: Internal Medicine

## 2013-01-27 VITALS — BP 155/82 | HR 92 | Temp 98.3°F | Ht 65.0 in | Wt 151.1 lb

## 2013-01-27 DIAGNOSIS — E1149 Type 2 diabetes mellitus with other diabetic neurological complication: Secondary | ICD-10-CM

## 2013-01-27 DIAGNOSIS — E1142 Type 2 diabetes mellitus with diabetic polyneuropathy: Secondary | ICD-10-CM

## 2013-01-27 DIAGNOSIS — I872 Venous insufficiency (chronic) (peripheral): Secondary | ICD-10-CM

## 2013-01-27 DIAGNOSIS — I10 Essential (primary) hypertension: Secondary | ICD-10-CM

## 2013-01-27 DIAGNOSIS — E1121 Type 2 diabetes mellitus with diabetic nephropathy: Secondary | ICD-10-CM

## 2013-01-27 DIAGNOSIS — E119 Type 2 diabetes mellitus without complications: Secondary | ICD-10-CM

## 2013-01-27 DIAGNOSIS — E1129 Type 2 diabetes mellitus with other diabetic kidney complication: Secondary | ICD-10-CM

## 2013-01-27 MED ORDER — FUROSEMIDE 40 MG PO TABS: 40.0000 mg | ORAL_TABLET | Freq: Two times a day (BID) | ORAL | Status: DC

## 2013-01-27 MED ORDER — VERAPAMIL HCL 40 MG PO TABS: 40.0000 mg | ORAL_TABLET | Freq: Two times a day (BID) | ORAL | Status: DC

## 2013-01-27 MED ORDER — METFORMIN HCL 500 MG PO TABS: 1000.0000 mg | ORAL_TABLET | Freq: Two times a day (BID) | ORAL | Status: DC

## 2013-01-27 MED ORDER — GLIPIZIDE 5 MG PO TABS: 5.0000 mg | ORAL_TABLET | Freq: Every day | ORAL | Status: DC

## 2013-01-27 MED ORDER — LISINOPRIL 40 MG PO TABS: 40.0000 mg | ORAL_TABLET | Freq: Every day | ORAL | Status: DC

## 2013-01-27 NOTE — Patient Instructions (Addendum)
General Instructions:  Listed below are the medicines used to be taking from [place], there are five of them:  1. Furosemide (Lasix) - one 40 mg tablet twice a day (for fluid and blood pressure)  2. Lisinopril - two 20 mg tablets once a day (for blood pressure)  3. Metformin - two 500 mg tablets twice a day (for diabetes)  4. Verapamil - one 40mg  tablet twice a day (for blood pressure)  5. Glipizide - one 5mg  tablet once a day (for diabetes)    Treatment Goals:  Goals (1 Years of Data) as of 01/27/13         As of Today 12/04/12 12/04/12 11/06/12 10/30/12     Blood Pressure    . Blood Pressure < 130/80  155/82 152/88 153/87 157/95 150/81     Result Component    . HEMOGLOBIN A1C < 7.0     8.9     . LDL CALC < 100     100       Progress Toward Treatment Goals:  Treatment Goal 01/27/2013  Hemoglobin A1C unable to assess  Blood pressure unchanged    Self Care Goals & Plans:  Self Care Goal 11/06/2012  Manage my medications take my medicines as prescribed; refill my medications on time  Monitor my health (No Data)  Eat healthy foods drink diet soda or water instead of juice or soda; eat foods that are low in salt; eat baked foods instead of fried foods  Be physically active find time in my schedule       Care Management & Community Referrals:  Referral 01/27/2013  Referrals made for care management support diabetes educator

## 2013-01-27 NOTE — Assessment & Plan Note (Addendum)
BP Readings from Last 3 Encounters:  01/27/13 155/82  12/04/12 152/88  11/06/12 157/95    Lab Results  Component Value Date   NA 138 12/04/2012   K 4.0 12/04/2012   CREATININE 0.80 12/04/2012    Assessment:  Blood pressure control: moderately elevated  Progress toward BP goal:  unchanged  Comments: Was not taking furosemide at correct dose and was not taking metoprolol at all  Plan:  Medications:  continue furosemide 40 mg twice a day, increase lisinopril to 40 mg daily, and start verapamil 40 mg twice a day

## 2013-01-27 NOTE — Assessment & Plan Note (Signed)
Lab Results  Component Value Date   HGBA1C 8.9 11/06/2012     Assessment:  Diabetes control: poor control (HgbA1C >9%)  Progress toward A1C goal:  unable to assess  Plan:  Medications: Increase metformin to 1000 mg twice a day and start glipizide 5 mg daily  Home glucose monitoring: meeting with Joshua [name] today, hopefully he can get a glucometer, we will start with once a day testing  Educational resources provided: referral to diabetes education class

## 2013-01-27 NOTE — Progress Notes (Signed)
Diabetes Self-Management Education  Visit Number: Follow-up 1  01/27/2013 [name], identified by name and date of birth, is a 62 y.o. male with  .  Other people present during visit:      ASSESSMENT  Patient Concerns:   self monitoring- obtaining a meter and nutrition  Lab Results: LDL Cholesterol  Date Value Range Status  11/06/2012 100* 0 - 99 mg/dL Final          Hemoglobin A1C  Date Value Range Status  11/06/2012 8.9   Final     No family history on file. History  Substance Use Topics  . Smoking status: Former Smoker -- 20 years    Types: Cigarettes    Quit date: 11/25/1992  . Smokeless tobacco: Never Used  . Alcohol Use: 7.5 oz/week    15 drink(s) per week     Comment: Rarely.    Support Systems:    Special Needs:    no Prior DM Education:  yes Daily Foot Exams:  no Patient Belief / Attitude about Diabetes:   interested in self care  Assessment comments: patient given presto meter on his request with sample of 10 strips. He is aware that he will have to purchase more strips. If they are too expensive then he is aware his better option is to purchase a [employer] meter that has less expensive strips.    Diet Recall:  Drinks diet soda, eats giri which he reports is starchy, fish and vegetables.    Individualized Plan for Diabetes Self-Management Training:  Patient individualized diabetes plan discussed with patient at his last visit and includes:  what is Diabetes, Nutrition, medications, monitoring, how to handle highs and lows, Special care for my body when I have diabetes.  Education Topics Reviewed with Patient Today:  Topic Points Discussed  Disease State    Nutrition Management Role of diet in the treatment of diabetes and the relationship between the three main macronutrients and blood glucose level;Reviewed blood glucose goals for pre and post meals and how to evaluate the patients' food intake on their blood glucose level.;Meal timing in  regards to the patients' current diabetes medication.;Information on hints to eating out and maintain blood glucose control.  Physical Activity and Exercise    Medications Reviewed patients medication for diabetes, action, purpose, timing of dose and side effects.  Monitoring Taught/evaluated SMBG with [employer] presto meter. (enter comment re: meter type);Purpose and frequency of SMBG.  Acute Complications    Chronic Complications    Psychosocial Adjustment    Goal Setting    Preconception Care (if applicable)      PATIENTS GOALS   Learning Objective(s):     Goal The patient agrees to:  Nutrition Follow meal plan discussed  Physical Activity    Medications    Monitoring test my blood glucose as discussed 1x/day  Problem Solving    Reducing Risk    Health Coping     Patient Self-Evaluation of Goals (Subsequent Visits)  Goal The patient rates self as meeting goals (% of time)  Nutrition >75%  Physical Activity    Medications    Monitoring    Problem Solving    Reducing Risk    Health Coping       PERSONALIZED PLAN / SUPPORT  Self-Management Support:     ______________________________________________________________________  Outcomes  Expected Outcomes:  Demonstrated interest in learning. Expect positive outcomes  Self-Care Barriers:  Impaired vision;English as a second language;Lack of material resources  Education material provided:  yes- Living with diabetes booklet  If problems or questions, patient to contact team via:  Phone  Time in: 1530     Time out: 1615  Future DSME appointment: - 2 months   Plyler, Butch Penny 01/27/2013 4:54 PM

## 2013-01-27 NOTE — Progress Notes (Signed)
  Subjective:    Patient ID: Joshua [name], male    DOB: 1951/08/17, 62 y.o.   MRN: [id number]  HPI:  62 year old man who is relatively new to the clinic. We have diagnosed him with hypertension and diabetes, and we have been trying to optimize medical management for these conditions. He comes to the clinic today for followup. His only complaints are intermittent pain in his right thigh that is aching and throbbing in nature and neuropathic type pains described as tingling and burning in both feet. A friend gave him an unknown topical cream to use on his right side for the pain. This cream eliminated his symptoms. The swelling that use to plague his feet and scrotum have improved dramatically. He denies chest pain, dyspnea, headaches, and dizziness.   Review of Systems  Constitutional: Negative.  Negative for fever and chills.  Respiratory: Negative.  Negative for shortness of breath.   Cardiovascular: Negative.  Negative for chest pain and leg swelling.  Gastrointestinal: Negative.  Negative for abdominal pain.  Genitourinary: Negative.  Negative for dysuria.  Musculoskeletal: Positive for myalgias.  Skin: Negative for rash.  Neurological: Positive for numbness. Negative for dizziness and headaches.    Current Medications: 1. Furosemide 40 mg twice a day 2. Isoniazid 300 mg daily 3. Lisinopril 20 mg daily 4. Metformin 500 mg twice a day 5. Pyridoxine 50mg  daily 6. Rifampin 300mg  daily      Objective:   Physical Exam: GENERAL: well developed, well nourished; no acute distress HEAD: atraumatic, normocephalic EYES: pupils equal, round and reactive; sclera anicteric; normal conjunctiva NOSE/THROAT: oropharynx clear, moist mucous membranes, pink gums, poor dentition NECK: supple, no carotid bruits, thyroid normal in size and without palpable nodules LYMPH: no cervical or supraclavicular lymphadenopathy LUNGS: clear to auscultation bilaterally, normal work of breathing HEART: normal  rate and regular rhythm; normal S1 and S2 without S3 or S4; no murmurs, rubs, or clicks PULSES: radial, dorsalis pedis, and posterior tibial pulses are 2+ and symmetric ABDOMEN: soft, non-tender, normal bowel sounds MSK: Mild tenderness with palpation of the anterior thigh, no deformities or swelling or erythema noted SENSATION: absent sensation on the bottom of the feet, see diabetic foot exam for details SKIN: warm, dry, intact, normal turgor, no rashes EXTREMITIES: trace to 1+ pitting edema in the ankles bilaterally, no clubbing or cyanosis PSYCH: patient is alert and oriented, mood and affect are normal and congruent  Filed Vitals:   01/27/13 1425  BP: 155/82  Pulse: 92  Temp: 98.3 F (36.8 C)    BP Readings from Last 3 Encounters:  01/27/13 155/82  12/04/12 152/88  11/06/12 157/95     Recent Labs  01/27/13 1422  GLUCAP 236*          Assessment & Plan:

## 2013-01-28 ENCOUNTER — Encounter: Payer: Self-pay | Admitting: Internal Medicine

## 2013-01-28 NOTE — Assessment & Plan Note (Signed)
Will increase lisinopril to 40 mg daily and added verapamil 40 mg twice a day. Recheck urine microalbumin at next visit.

## 2013-02-05 ENCOUNTER — Encounter: Payer: Self-pay | Admitting: Internal Medicine

## 2013-02-05 DIAGNOSIS — N181 Chronic kidney disease, stage 1: Secondary | ICD-10-CM | POA: Insufficient documentation

## 2013-02-27 ENCOUNTER — Encounter: Payer: Self-pay | Admitting: Internal Medicine

## 2013-02-27 DIAGNOSIS — R269 Unspecified abnormalities of gait and mobility: Secondary | ICD-10-CM | POA: Insufficient documentation

## 2013-03-19 ENCOUNTER — Encounter: Payer: Self-pay | Admitting: Internal Medicine

## 2013-03-19 ENCOUNTER — Ambulatory Visit (INDEPENDENT_AMBULATORY_CARE_PROVIDER_SITE_OTHER): Payer: PRIVATE HEALTH INSURANCE | Admitting: Internal Medicine

## 2013-03-19 VITALS — BP 135/73 | HR 84 | Temp 97.0°F | Ht 65.0 in | Wt 163.1 lb

## 2013-03-19 DIAGNOSIS — I8310 Varicose veins of unspecified lower extremity with inflammation: Secondary | ICD-10-CM

## 2013-03-19 DIAGNOSIS — N058 Unspecified nephritic syndrome with other morphologic changes: Secondary | ICD-10-CM

## 2013-03-19 DIAGNOSIS — S91119A Laceration without foreign body of unspecified toe without damage to nail, initial encounter: Secondary | ICD-10-CM

## 2013-03-19 DIAGNOSIS — E1142 Type 2 diabetes mellitus with diabetic polyneuropathy: Secondary | ICD-10-CM

## 2013-03-19 DIAGNOSIS — I10 Essential (primary) hypertension: Secondary | ICD-10-CM

## 2013-03-19 DIAGNOSIS — S91109A Unspecified open wound of unspecified toe(s) without damage to nail, initial encounter: Secondary | ICD-10-CM

## 2013-03-19 DIAGNOSIS — E1121 Type 2 diabetes mellitus with diabetic nephropathy: Secondary | ICD-10-CM

## 2013-03-19 DIAGNOSIS — Z9189 Other specified personal risk factors, not elsewhere classified: Secondary | ICD-10-CM | POA: Insufficient documentation

## 2013-03-19 DIAGNOSIS — E1165 Type 2 diabetes mellitus with hyperglycemia: Secondary | ICD-10-CM

## 2013-03-19 DIAGNOSIS — E1129 Type 2 diabetes mellitus with other diabetic kidney complication: Secondary | ICD-10-CM

## 2013-03-19 DIAGNOSIS — I872 Venous insufficiency (chronic) (peripheral): Secondary | ICD-10-CM

## 2013-03-19 LAB — GLUCOSE, CAPILLARY: Glucose-Capillary: 172 mg/dL — ABNORMAL HIGH (ref 70–99)

## 2013-03-19 LAB — POCT GLYCOSYLATED HEMOGLOBIN (HGB A1C): Hemoglobin A1C: 7.7

## 2013-03-19 MED ORDER — GLIPIZIDE 5 MG PO TABS: 5.0000 mg | ORAL_TABLET | Freq: Two times a day (BID) | ORAL | Status: DC

## 2013-03-19 NOTE — Progress Notes (Signed)
  Subjective:    Patient ID: Joshua [name], male    DOB: 07/28/1951, 62 y.o.   MRN: [id number]  HPI:  This is a 62 year old man who has hypertension and diabetes. For the last few months, we have been trying to optimize medical management and gain control of these conditions. He comes to the clinic for followup today. When the nurses went to do the foot exam, they found that he had attempted to trim his toenails yesterday with a razor blade and ended up lacerating the distal tip of all 10 toes. He has no sensation whatsoever in his feet and toes. He tried scraping off dried blood with the razor blade, which worsened the problem. Otherwise he feels fine. He denies dizziness, headaches, dyspnea, and chest pain. He denies fevers and chills.    Review of Systems  Constitutional: Negative for fever and chills.  Respiratory: Negative for shortness of breath.   Cardiovascular: Negative for chest pain.  Neurological: Negative for dizziness and headaches.    Current Medications: 1. Furosemide 40 mg twice a day 2. Glipizide 5 mg once daily 3. Isoniazid 900 mg twice a week 4. Lisinopril 40 mg daily 5. Metformin 1000 mg twice a day 6. Pyridoxine 50 mg twice a week 7. Rifampin 600 mg twice a week 8. Verapamil 40 mg twice a day     Objective:   Physical Exam  GENERAL: overweight; no acute distress HEAD: atraumatic, normocephalic EYES: pupils equal, round and reactive; sclera anicteric; normal conjunctiva LUNGS: clear to auscultation bilaterally, normal work of breathing HEART: normal rate and regular rhythm; normal S1 and S2 without S3 or S4; no murmurs, rubs, or clicks PULSES: radial and dorsalis pedis pulses are 2+ and symmetric ABDOMEN: soft, non-tender, normal bowel sounds SKIN: warm, dry, intact, normal turgor, no rashes EXTREMITIES: all 10 toenails have been cut to the quick and the distal tips of the toes have been lacerated to varying extents   Filed Vitals:   03/19/13 1612  BP:  135/73  Pulse: 84  Temp: 97 F (36.1 C)    BP Readings from Last 3 Encounters:  03/19/13 135/73  01/27/13 155/82  12/04/12 152/88    Lab Results  Component Value Date   HGBA1C 7.7 03/19/2013   HGBA1C 8.9 11/06/2012   Lab Results  Component Value Date   MICROALBUR 450.28* 10/14/2012   LDLCALC 100* 11/06/2012   CREATININE 0.80 12/04/2012      Recent Labs  03/19/13 1602  GLUCAP 172*       Assessment & Plan:

## 2013-03-19 NOTE — Patient Instructions (Addendum)
General Instructions:  Increase glipizide to (1) 5 mg tablet twice a day with a meal. Take with breakfast and with dinner.    Treatment Goals:  Goals (1 Years of Data) as of 03/19/13         As of Today 01/27/13 12/04/12 12/04/12 11/06/12     Blood Pressure    . Blood Pressure < 130/80  135/73 155/82 152/88 153/87 157/95     Lifestyle    . Prevent Falls           Result Component    . HEMOGLOBIN A1C < 7.0  7.7    8.9    . LDL CALC < 100      100      Progress Toward Treatment Goals:  Treatment Goal 03/19/2013  Hemoglobin A1C improved  Blood pressure improved  Prevent falls unable to assess    Self Care Goals & Plans:  Self Care Goal 11/06/2012  Manage my medications take my medicines as prescribed; refill my medications on time  Monitor my health (No Data)  Eat healthy foods drink diet soda or water instead of juice or soda; eat foods that are low in salt; eat baked foods instead of fried foods  Be physically active find time in my schedule       Care Management & Community Referrals:  Referral 03/19/2013  Referrals made for care management support other (see comment)

## 2013-03-22 ENCOUNTER — Ambulatory Visit (INDEPENDENT_AMBULATORY_CARE_PROVIDER_SITE_OTHER): Payer: PRIVATE HEALTH INSURANCE | Admitting: Radiation Oncology

## 2013-03-22 ENCOUNTER — Encounter: Payer: Self-pay | Admitting: Radiation Oncology

## 2013-03-22 VITALS — BP 154/89 | HR 87 | Temp 97.1°F | Ht 65.0 in | Wt 161.1 lb

## 2013-03-22 DIAGNOSIS — I872 Venous insufficiency (chronic) (peripheral): Secondary | ICD-10-CM

## 2013-03-22 DIAGNOSIS — E1142 Type 2 diabetes mellitus with diabetic polyneuropathy: Secondary | ICD-10-CM | POA: Insufficient documentation

## 2013-03-22 DIAGNOSIS — I878 Other specified disorders of veins: Secondary | ICD-10-CM

## 2013-03-22 DIAGNOSIS — S91119A Laceration without foreign body of unspecified toe without damage to nail, initial encounter: Secondary | ICD-10-CM | POA: Insufficient documentation

## 2013-03-22 NOTE — Assessment & Plan Note (Signed)
BP Readings from Last 3 Encounters:  03/19/13 135/73  01/27/13 155/82  12/04/12 152/88    Lab Results  Component Value Date   NA 138 12/04/2012   K 4.0 12/04/2012   CREATININE 0.80 12/04/2012    Assessment: Blood pressure control: mildly elevated Progress toward BP goal:  improved  Plan: Medications:  Continue furosemide 40 mg twice a day, lisinopril 40 mg daily, and verapamil 40 mg twice a day

## 2013-03-22 NOTE — Progress Notes (Signed)
Subjective:    Patient ID: Joshua [name], male    DOB: 09/04/1951, 62 y.o.   MRN: [id number]  HPI Patient returns to clinic today for followup on his recently sustained toe lacerations. He was initially seen for this issue in clinic on 03/19/2013, at which time these wounds were cleaned and bandaged, and the patient was given instructions to return to clinic today for reassessment.   He denies any fever or chills since his previous visit. Denies any erythema or edema of his bilateral feet/toes. He denies any pain associated with these lacerations. The patient also has open wounds involving the bilateral lower extremities below the knees, which he states he continues to self-manage at home with bandaging. He states these wounds are unchanged since previous visit.  Patient has no other complaints today and is feeling well.  Review of Systems  All other systems reviewed and are negative.   Current Outpatient Medications: Current Outpatient Prescriptions  Medication Sig Dispense Refill  . furosemide (LASIX) 40 MG tablet Take 1 tablet (40 mg total) by mouth 2 (two) times daily.  60 tablet  2  . glipiZIDE (GLUCOTROL) 5 MG tablet Take 1 tablet (5 mg total) by mouth 2 (two) times daily before a meal.  30 tablet  11  . glucose monitoring kit (FREESTYLE) monitoring kit 1 each by Does not apply route as needed for other.  1 each    . isoniazid (NYDRAZID) 300 MG tablet Take 900 mg by mouth 2 (two) times a week.      Marland Kitchen lisinopril (PRINIVIL,ZESTRIL) 40 MG tablet Take 1 tablet (40 mg total) by mouth daily.  30 tablet  4  . metFORMIN (GLUCOPHAGE) 500 MG tablet Take 2 tablets (1,000 mg total) by mouth 2 (two) times daily with a meal.  60 tablet  11  . pyridOXINE (VITAMIN B-6) 50 MG tablet Take 50 mg by mouth 2 (two) times a week.      . rifampin (RIFADIN) 300 MG capsule Take 600 mg by mouth 2 (two) times a week.      . verapamil (CALAN) 40 MG tablet Take 1 tablet (40 mg total) by mouth 2 (two) times  daily.  60 tablet  2   No current facility-administered medications for this visit.    Allergies: No Known Allergies   Past Medical History: Past Medical History  Diagnosis Date  . 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 Hypertension 10/14/2012  . Type 2 diabetes mellitus   . Diabetic nephropathy with proteinuria   . 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 Knee arthroscopy  2010    3 times, right knee    Family History: No family history on file.  Social History: History   Social History  . Marital Status: Single    Spouse Name: N/A    Number of Children: 5  . 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 Smoking status: Former Smoker -- 20 years    Types: Cigarettes    Quit date: 11/25/1992  . Smokeless tobacco: Never Used  . Alcohol Use: 7.5 oz/week    15 drink(s) per week     Comment: Rarely.  . Drug Use: No  . Sexually Active: Not on file     Comment: 09/2012: > 15 partners in past 3 years, inconsistent condom use   Other Topics  Concern  . Not on file   Social History Narrative   Has a masters degree in education     Vital Signs: Blood pressure 154/89, pulse 87, temperature 97.1 F (36.2 C), temperature source Oral, height 5\' 5"  (1.651 m), weight 161 lb 1.6 oz (73.074 kg), SpO2 96.00%.      Objective:   Physical Exam  Constitutional: He is oriented to person, place, and time. He appears well-developed and well-nourished. No distress.  HENT:  Head: Normocephalic and atraumatic.  Eyes: Conjunctivae are normal. Pupils are equal, round, and reactive to light. No scleral icterus.  Neck: Normal range of motion. Neck supple. No tracheal deviation present.  Cardiovascular: Normal rate and regular rhythm.   No murmur heard. Pulmonary/Chest: Effort normal. He has no wheezes. He has no rales.   Abdominal: Soft. Bowel sounds are normal. He exhibits no distension. There is no tenderness.  Musculoskeletal: Normal range of motion. He exhibits no edema.  Neurological: He is alert and oriented to person, place, and time. No cranial nerve deficit.  Skin:  Legs: 2+ pitting edema bilaterally. Superficial wounds present on bilateral legs with no associated erythema or edema.  Bilateral feet: Granulation tissue with evidence of surrounding maceration most significantly involving the R 1st and 2nd distal toes and the L 2nd toe. No active bleeding. No erythema or edema. Non-malodorous. No discharge.    Psychiatric: He has a normal mood and affect. His behavior is normal.          Assessment & Plan:

## 2013-03-22 NOTE — Progress Notes (Signed)
I saw and evaluated the patient.  I personally confirmed the key portions of Dr. Marciano Sequin history and exam and reviewed pertinent patient test results.  The assessment, diagnosis, and plan were formulated together and I agree with the documentation in the resident's note.  His feet wounds look stable and uninfected with good granulation tissue.

## 2013-03-22 NOTE — Assessment & Plan Note (Signed)
Weeping wounds on the anterior left lower leg. We will send him to the wound clinic for this as well as the lacerations on his toes.

## 2013-03-22 NOTE — Assessment & Plan Note (Signed)
Wounds appear stable. Patient will be referred to the wound clinic today for management of bilateral lower extremity wounds that appear to be secondary to this issue (in addition to bilateral foot wounds pt recently sustained).

## 2013-03-22 NOTE — Patient Instructions (Signed)
   We will schedule an appointment for you to be seen in the wound clinic within the next few days. Please attend this appointment.  Please continue taking your medications as previously prescribed.   Call us if you have any fevers or chills, or if you have swelling or increased pain in your feet.   It was nice to meet you Joshua [name]. Have a great day.

## 2013-03-22 NOTE — Assessment & Plan Note (Signed)
Wounds appear to be healing well. Pt will be referred to the wound care clinic.

## 2013-03-22 NOTE — Assessment & Plan Note (Signed)
This is at high risk for infection. We cleaned his toes with soap and water today in the clinic, flushing with sterile water, and dressing with sterile dry bandages. He will return to the clinic Monday for followup. At that time, we will establish followup for him at the wound clinic.

## 2013-03-22 NOTE — Assessment & Plan Note (Signed)
Lab Results  Component Value Date   HGBA1C 7.7 03/19/2013   HGBA1C 8.9 11/06/2012     Assessment: Diabetes control: fair control Progress toward A1C goal:  improved  Plan: Medications:  Continue metformin 1000 mg twice a day, increase glipizide to 5 mg twice a day

## 2013-03-24 ENCOUNTER — Encounter: Payer: Self-pay | Admitting: *Deleted

## 2013-03-24 NOTE — Progress Notes (Signed)
Patient ID: Joshua [name], male   DOB: [birth year], 62 y.o.   MRN: [id number] Pt not able to be seen at wound center until May 8 due to his own work schedule. He came to pick up supplies to change left leg wounds and bilateral foot wounds. Left leg wounds "just happened like blisters that burst" per pt. Top layer of skin partially attached at approx. 8 o'clock - pink/red tissue visible approx mid left lateral calf - dressed 03/22/13- at that time, open skin area oval in shape and approx 4 cm long and 3 cm wide- dressed on 03/22/13, after gentle cleansing with tap water and antibacterial soap, with neosporin and Allyn 3 x 3 non-stick absorbent dressing - area had been draining serous, clear fluid. Lower left leg wound cleansed same way, small approx 5 mm area also exposed pink/red skin noted and approx. 46mm proximal was larger area, same condition as open area described above, this one measuring approx 4 cm wide by 5 cm long, same drainage - 2 of the 3x3 Allyn dressings, overlapped, were used to cover area after Neosporin applied.  Bilateral 1,2,3 toes had been "sliced", basically removing nails and tips of toes, by pt the previous week and same dressing had been applied on 03/19/13. At that time, dried blood and dead skin gently removed by washing with tap water and antibacterial soap, patted dry, and all toe tips tissue was reddened, some serous and some sanguinous drainage soaked pt's socks on 4/25. Reddened areas covered with Neosporin, then 5x5 Allyn non-stick absorbent dressings, reinforced by Kling wrap to secure cover and non-slip hospital socks worn over the dressings - pt wore sandals home - instructed to leave dressings in tact until seen 03/22/13. On 4/28, old dressings from toes removed and had moderate amount dried serous drainage. Toe tip tissue now pinkened, no sanguinous drainage - both feet washed and dressed as had been done on 4/25 - pt instructed to leave dressings in tact until 5/1.  On 03/24/13,  4:30PM, pt repeats, as he has since first seen 03/19/13  "no feeling" in feet so denies discomfort. Pt instructed to come to clinic to pick up supplies today, which he did, so that he could change own dressings on 5/1 and again on 5/5, since he could not get into the wound center to be seen until 04/01/13 - Pt able to teach back (by word and by demonstration) how to cleanse areas, apply Neosporin, and re-dress areas. Pt also able to verbalize understanding not to walk barefoot, to keep feet covered at all times at work and when not in his own house, to call this office if there is any change in the way his wounds appeared, smelled, etc. Leroy Sea, 03/24/13, charted at 5:35 PM

## 2013-03-24 NOTE — Addendum Note (Signed)
Addended by: Ebbie Latus on: 03/24/2013 09:59 AM   Modules accepted: Orders

## 2013-03-25 NOTE — Progress Notes (Signed)
I saw and evaluated the patient.  I personally confirmed the key portions of Dr. Wallace's history and exam and reviewed pertinent patient test results.  The assessment, diagnosis, and plan were formulated together and I agree with the documentation in the resident's note. 

## 2013-03-30 ENCOUNTER — Other Ambulatory Visit: Payer: Self-pay | Admitting: Infectious Diseases

## 2013-03-30 ENCOUNTER — Ambulatory Visit
Admission: RE | Admit: 2013-03-30 | Discharge: 2013-03-30 | Disposition: A | Discharge status: Home / Self Care | Payer: No Typology Code available for payment source | Source: Ambulatory Visit | Attending: Infectious Diseases | Admitting: Infectious Diseases

## 2013-03-30 DIAGNOSIS — R7611 Nonspecific reaction to tuberculin skin test without active tuberculosis: Secondary | ICD-10-CM

## 2013-03-31 ENCOUNTER — Telehealth: Payer: Self-pay | Admitting: *Deleted

## 2013-03-31 NOTE — Telephone Encounter (Signed)
Pt called asking for a referral to vascular MD. Pt seen in clinic on 4/28 and told to return for swelling to legs. He reports swelling to legs and he also states he will not be going to wound care as his toes are fine.  Will see tomorrow. Pt denies SOB

## 2013-04-01 ENCOUNTER — Ambulatory Visit (INDEPENDENT_AMBULATORY_CARE_PROVIDER_SITE_OTHER): Payer: Self-pay | Admitting: Internal Medicine

## 2013-04-01 ENCOUNTER — Encounter (HOSPITAL_BASED_OUTPATIENT_CLINIC_OR_DEPARTMENT_OTHER): Payer: No Typology Code available for payment source | Attending: Internal Medicine

## 2013-04-01 ENCOUNTER — Encounter: Payer: Self-pay | Admitting: Internal Medicine

## 2013-04-01 VITALS — BP 145/84 | HR 90 | Temp 98.2°F | Ht 64.0 in | Wt 157.1 lb

## 2013-04-01 DIAGNOSIS — E1169 Type 2 diabetes mellitus with other specified complication: Secondary | ICD-10-CM | POA: Insufficient documentation

## 2013-04-01 DIAGNOSIS — I10 Essential (primary) hypertension: Secondary | ICD-10-CM

## 2013-04-01 DIAGNOSIS — N058 Unspecified nephritic syndrome with other morphologic changes: Secondary | ICD-10-CM

## 2013-04-01 DIAGNOSIS — L97509 Non-pressure chronic ulcer of other part of unspecified foot with unspecified severity: Secondary | ICD-10-CM | POA: Insufficient documentation

## 2013-04-01 DIAGNOSIS — E1142 Type 2 diabetes mellitus with diabetic polyneuropathy: Secondary | ICD-10-CM | POA: Insufficient documentation

## 2013-04-01 DIAGNOSIS — E1149 Type 2 diabetes mellitus with other diabetic neurological complication: Secondary | ICD-10-CM | POA: Insufficient documentation

## 2013-04-01 DIAGNOSIS — IMO0002 Reserved for concepts with insufficient information to code with codable children: Secondary | ICD-10-CM

## 2013-04-01 DIAGNOSIS — E1129 Type 2 diabetes mellitus with other diabetic kidney complication: Secondary | ICD-10-CM

## 2013-04-01 DIAGNOSIS — I872 Venous insufficiency (chronic) (peripheral): Secondary | ICD-10-CM

## 2013-04-01 DIAGNOSIS — S91119D Laceration without foreign body of unspecified toe without damage to nail, subsequent encounter: Secondary | ICD-10-CM

## 2013-04-01 DIAGNOSIS — R609 Edema, unspecified: Secondary | ICD-10-CM

## 2013-04-01 DIAGNOSIS — L97809 Non-pressure chronic ulcer of other part of unspecified lower leg with unspecified severity: Secondary | ICD-10-CM | POA: Insufficient documentation

## 2013-04-01 DIAGNOSIS — E1165 Type 2 diabetes mellitus with hyperglycemia: Secondary | ICD-10-CM

## 2013-04-01 DIAGNOSIS — Z599 Problem related to housing and economic circumstances, unspecified: Secondary | ICD-10-CM

## 2013-04-01 DIAGNOSIS — Z598 Other problems related to housing and economic circumstances: Secondary | ICD-10-CM

## 2013-04-01 DIAGNOSIS — R600 Localized edema: Secondary | ICD-10-CM

## 2013-04-01 DIAGNOSIS — Z5189 Encounter for other specified aftercare: Secondary | ICD-10-CM

## 2013-04-01 DIAGNOSIS — I8310 Varicose veins of unspecified lower extremity with inflammation: Secondary | ICD-10-CM

## 2013-04-01 NOTE — Assessment & Plan Note (Signed)
BP Readings from Last 3 Encounters:  04/01/13 145/84  03/22/13 154/89  03/19/13 135/73    Lab Results  Component Value Date   NA 138 12/04/2012   K 4.0 12/04/2012   CREATININE 0.80 12/04/2012    Assessment: Blood pressure control:   mildly elevated  Progress toward BP goal:    not quite great Comments:  Plan: Medications:  continue current medications, Continue Lasix verapamil and lisinopril Educational resources provided: brochure;handout;video Self management tools provided:   Other plans: 2-D echo scheduled for next week. If patient has cardiac issues which he likely has, will need to be more aggressive with blood pressure control. No changes made today.

## 2013-04-01 NOTE — Progress Notes (Signed)
Pt aware of appt 2D echo 04/07/13 10AM - pt reqt [facility]. Hilda Blades Fontella Shan RN 04/01/13 10AM

## 2013-04-01 NOTE — Assessment & Plan Note (Signed)
Please see workup for bilateral lower extremity edema.

## 2013-04-01 NOTE — Progress Notes (Signed)
  Subjective:    Patient ID: Joshua [name], male    DOB: 1950-12-08, 62 y.o.   MRN: [id number]  HPI patient is a pleasant 62 year old man with diabetes, hypertension, chronic bilateral lower leg edema and other problems as per problem list comes to the clinic for followup visit for his wound. He was seen in the clinic about 10 days before and was referred to wound care clinic for further help in management of his chronic intermittent venous stasis dermatitis and wound and new toe laceration. Toe laceration wounds has been healed today.  History dressing over the left mid shin wound- which he says is healing also. There are no open wounds on right leg today. He still has fluid coming out of the left leg wound.  He denies any fever, chills, malodorous drainage, redness or pain at the wound site.  He denies any chest pain, shortness of breath, abdominal pain, diarrhea, headache, palpitations, vision changes.  He was wondering if he needs to see a vascular surgeon for his chronic leg edema issues.    Review of Systems    as per history of present illness. Objective:   Physical Exam  General: NAD HEENT: PERRL, EOMI, no scleral icterus Cardiac: S1, S2, RRR, no rubs, murmurs or gallops Pulm: clear to auscultation bilaterally, moving normal volumes of air Abd: soft, nontender, nondistended, BS present Ext: 2+ pedal edema bilaterally. Has left mid shin wound dressing in place. Neuro: alert and oriented X3, cranial nerves II-XII grossly intact       Assessment & Plan:

## 2013-04-01 NOTE — Assessment & Plan Note (Addendum)
Patient with chronic bilateral lower extremity edema since about 8 months. Has been worked up with multiple evaluations and labs including renal function and hepatic function- which have not been strongly correlated to the etiology. A combination of venous stasis and possible heart function problem has been speculated. Elevated proBNP in December 2013- 1400. Recent chest x-ray on 5 04/27/2013 shows Kerley B line is increased interstitial edema likely consistent with cardiovascular etiology. Although 2-D echo has not been performed yet.  - 2-D echo scheduled for Wednesday- 04/07/2013 - Continue Lasix 40 mg twice daily meanwhile. - Patient has [facility]. We'll get records from them. - Will check CMP next visit if that was not done at the health department. - Patient wanted to see if a vascular surgery consult might be helpful. Discussed with him that we have not completed his workup yet and so it would be premature to do so.  - Patient is agreeable with the plan. - I will see him back in 7-10 days. - Also discussed with patient in detail about need to be seen at the Wound Care clinic at least once considering his chronic venous stasis issues and diabetes.Marland Kitchen He will keep the appointment for today at 1 PM.

## 2013-04-01 NOTE — Patient Instructions (Signed)
Please make followup appointment in 7-10 days.  Get 2-D echo- ultrasound of your heart meanwhile.  Continue taking all medications regularly as you do.  Keep a check on the blisters on your leg and if you have any increased discharge, redness, swelling, any new concerning changes give [place] a call to make an early appointment.

## 2013-04-01 NOTE — Assessment & Plan Note (Signed)
Laceration healed. - Patient to follow with Wound Care clinic today at 1 PM.

## 2013-04-01 NOTE — Assessment & Plan Note (Signed)
Last A1c 7.7 Continue glipizide and metformin.

## 2013-04-01 NOTE — Progress Notes (Signed)
I discussed this case with Dr. Posey Pronto soon after the patient visit. I have read the documentation and I agree with the plan of care. Please see the resident note for details of management.

## 2013-04-01 NOTE — Assessment & Plan Note (Signed)
Patient will need to see Bonna Gains next time he comes. She was not here today.

## 2013-04-02 NOTE — Progress Notes (Signed)
[facility]  NAME:  Joshua [name], Joshua [name]              ACCOUNT NO.:  [id number]  MEDICAL RECORD NO.:  [id number]      DATE OF BIRTH:  1951-03-02  PHYSICIAN:  Ricard Dillon, M.D.      VISIT DATE:                                  OFFICE VISIT   HISTORY OF PRESENT ILLNESS:  Joshua [name] is a 62 year old man who was referred through his primary care office for review of wounds on his toes.  He is a diabetic who has significant diabetic neuropathy.  He was apparently attempting to trim his toenails when he was seen in the primary care office on April 25.  However, he ended up lacerating the distal tip of all his toes.  This was quite problematic and he was seen in followup for this on April 28 and still had open wounds, and he was referred here for this.  Fortunately all of these wounds have healed. There are no open areas on his toes.  However, when we were reviewing him, he has very significant bilateral venous stasis and the venous stasis ulceration on the lateral left leg.  PAST MEDICAL HISTORY: 1. 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 2. Hypertension. 3. History of hypoproteinemia. 4. TB, exact status uncertain though he is on treatment with     isoniazid.  MEDICATION LIST:  Otherwise reviewed.  PHYSICAL EXAMINATION:  VITAL SIGNS:  Temperature 98.5, pulse 85, respirations 18, blood pressure is elevated at 170/99.  Blood glucose is 151. RESPIRATORY:  Clear air entry bilaterally.  CARDIAC:  Heart sounds are normal.  There are no murmurs.  His JVP is not elevated. EXTREMITIES:  He does have scant sacral edema, very significant bilateral lower extremity edema, and significant venous stasis.  The edema extends above his knee posteriorly.  The right leg has segment significant changes of venous stasis as well, however, there is no open area.  WOUND EXAM:  The area in question is on the left  posterior lateral leg. It measures 2.9 x 2 x 0.1.  This is superficial, certainly not infected. Careful inspection of his toes including between his toes shows no wounds.  The areas in question are resolved.  He does note he has significant neuropathy below the knee.  He was reasonably insensate.  IMPRESSIONS: 1. Severe bilateral venous stasis with a venous stasis ulceration on     the left.  We applied silver alginate to this area under a     __________ wrap. 2. Recent diabetic wounds to his toes.  As described above, there are     no open areas here.  He is certainly at risk.  Going forward, this patient is at risk of further wounds in his bilateral feet.  We did discuss diabetic shoes and custom inserts, however, the patient is concerned about the cost.  He has insurance.  It would be worthwhile looking through his policy to see if there is any coverage here.  He is also going to need graded pressure stockings.  He tells me he has had a pair of these in the past, but he will need a new pair and we would be glad to write the prescription for this.  I  do not expect that the superficial wound on his left leg will cause much difficulty in healing.  We will see him again in a week.          ______________________________ Ricard Dillon, M.D.     MGR/MEDQ  D:  04/01/2013  T:  04/02/2013  Job:  [id number]

## 2013-04-07 ENCOUNTER — Ambulatory Visit (HOSPITAL_COMMUNITY)
Admission: RE | Admit: 2013-04-07 | Discharge: 2013-04-07 | Disposition: A | Discharge status: Home / Self Care | Payer: No Typology Code available for payment source | Source: Ambulatory Visit | Attending: Internal Medicine | Admitting: Internal Medicine

## 2013-04-07 DIAGNOSIS — R600 Localized edema: Secondary | ICD-10-CM

## 2013-04-07 DIAGNOSIS — R609 Edema, unspecified: Secondary | ICD-10-CM | POA: Insufficient documentation

## 2013-04-07 DIAGNOSIS — N181 Chronic kidney disease, stage 1: Secondary | ICD-10-CM | POA: Insufficient documentation

## 2013-04-07 DIAGNOSIS — E119 Type 2 diabetes mellitus without complications: Secondary | ICD-10-CM | POA: Insufficient documentation

## 2013-04-07 DIAGNOSIS — I059 Rheumatic mitral valve disease, unspecified: Secondary | ICD-10-CM

## 2013-04-07 DIAGNOSIS — I129 Hypertensive chronic kidney disease with stage 1 through stage 4 chronic kidney disease, or unspecified chronic kidney disease: Secondary | ICD-10-CM | POA: Insufficient documentation

## 2013-04-07 DIAGNOSIS — I509 Heart failure, unspecified: Secondary | ICD-10-CM | POA: Insufficient documentation

## 2013-04-07 DIAGNOSIS — Z87891 Personal history of nicotine dependence: Secondary | ICD-10-CM | POA: Insufficient documentation

## 2013-04-08 ENCOUNTER — Ambulatory Visit: Payer: Self-pay | Admitting: Internal Medicine

## 2013-04-09 ENCOUNTER — Encounter: Payer: Self-pay | Admitting: Internal Medicine

## 2013-04-09 ENCOUNTER — Ambulatory Visit (INDEPENDENT_AMBULATORY_CARE_PROVIDER_SITE_OTHER): Payer: No Typology Code available for payment source | Admitting: Internal Medicine

## 2013-04-09 VITALS — BP 135/76 | HR 79 | Temp 97.0°F | Ht 66.5 in | Wt 149.7 lb

## 2013-04-09 DIAGNOSIS — N058 Unspecified nephritic syndrome with other morphologic changes: Secondary | ICD-10-CM

## 2013-04-09 DIAGNOSIS — I8310 Varicose veins of unspecified lower extremity with inflammation: Secondary | ICD-10-CM

## 2013-04-09 DIAGNOSIS — R609 Edema, unspecified: Secondary | ICD-10-CM

## 2013-04-09 DIAGNOSIS — Z8611 Personal history of tuberculosis: Secondary | ICD-10-CM

## 2013-04-09 DIAGNOSIS — E1165 Type 2 diabetes mellitus with hyperglycemia: Secondary | ICD-10-CM

## 2013-04-09 DIAGNOSIS — IMO0002 Reserved for concepts with insufficient information to code with codable children: Secondary | ICD-10-CM

## 2013-04-09 DIAGNOSIS — I872 Venous insufficiency (chronic) (peripheral): Secondary | ICD-10-CM

## 2013-04-09 DIAGNOSIS — R600 Localized edema: Secondary | ICD-10-CM

## 2013-04-09 DIAGNOSIS — I10 Essential (primary) hypertension: Secondary | ICD-10-CM

## 2013-04-09 DIAGNOSIS — A150 Tuberculosis of lung: Secondary | ICD-10-CM

## 2013-04-09 LAB — GLUCOSE, CAPILLARY
Glucose-Capillary: 138 mg/dL — ABNORMAL HIGH (ref 70–99)
Glucose-Capillary: 170 mg/dL — ABNORMAL HIGH (ref 70–99)

## 2013-04-09 NOTE — Patient Instructions (Signed)
Please make a followup appointment in 4-6 weeks.  Decrease the dose of Lasix to 40 mg daily. If you have increased swelling in your legs, take 40 mg twice daily temporarily until the swelling resolves.  If you start having shortness of breath, chest pain, fever, chills, night sweats or other symptoms give [place] a call to make an early appointment.  We will try to get the scan of your chest through the health department if possible.  Continue taking all medications regularly.

## 2013-04-09 NOTE — Assessment & Plan Note (Signed)
Leg Swelling much improved with Lasix 40 twice a day and leg elevation.  He is going to get graded compression stockings.  [place] for their help.  Decrease the dose of Lasix to 40 daily. Advised to use 40 twice daily as needed depending on swelling.

## 2013-04-09 NOTE — Assessment & Plan Note (Signed)
2-D echo did not show signs of ventricular dysfunction.  His leg swelling is from venous stasis.  Discussed with him in detail about this and treatment with graded compression stockings, leg elevation and Lasix.  Decrease the dose of Lasix to 40 daily and use 40 twice a day intermittently depending on swelling.

## 2013-04-09 NOTE — Progress Notes (Signed)
  Subjective:    Patient ID: Joshua [name], male    DOB: [birth year], 62 y.o.   MRN: [id number]  HPI patient is a pleasant 62 year old man with history of pulmonary TB, DM 2, hypertension, venous stasis dermatitis and other problems as per problem list who comes to the clinic for followup visit for his leg swelling and ulcers.  His leg swelling is much better. He followed up with wound clinic twice before this visit. The wounds are healed. They recommended graded compression stockings for venous stasis. Also recommended diabetic shoes, but his insurance does not pay for it.  He takes Lasix 40 mg twice daily for the swelling.  He denies any short of breath, fever, chills, nausea vomiting or headache, palpitations, diarrhea, abdominal pain.  He [facility] Quentin Cornwall [facility] at [facility] for further TB followup. He completed the course of anti-TB  therapy 2 weeks before and was cleared.   Review of Systems    as per history of present illness. Objective:   Physical Exam General: NAD HEENT: PERRL, EOMI, no scleral icterus Cardiac: RRR, no rubs, murmurs or gallops Pulm: clear to auscultation bilaterally, moving normal volumes of air Abd: soft, nontender, nondistended, BS present Ext: warm and well perfused, healed bilateral ulcers. Venous stasis discoloration. Trace pedal edema bilaterally. Neuro: alert and oriented X3, cranial nerves II-XII grossly intact        Assessment & Plan:

## 2013-04-09 NOTE — Assessment & Plan Note (Signed)
Continue glipizide and metformin.

## 2013-04-09 NOTE — Assessment & Plan Note (Signed)
BP Readings from Last 3 Encounters:  04/09/13 135/76  04/01/13 145/84  03/22/13 154/89    Lab Results  Component Value Date   NA 138 12/04/2012   K 4.0 12/04/2012   CREATININE 0.80 12/04/2012    Assessment: Blood pressure control:   at goal Progress toward BP goal:    improved Comments:   Plan: Medications:  continue current medications, Lisinopril 40, verapamil 40 and Lasix 40 daily. Dose of Lasix decreased from twice daily to daily. Educational resources provided: Therapist, sports tools provided:   Other plans: Decreased the dose of Lasix to once daily.

## 2013-04-09 NOTE — Assessment & Plan Note (Signed)
Completed the course 2 weeks before. He was cleared [facility] Quentin [facility] at [facility] with no further treatment needed at this point in time.  Followup chest x-ray on 03/30/2013 showed left pleural effusion. 2-D echo on 04/07/2013 showed 55-60% EF with no diastolic dysfunction but loculated left pleural or left pericardial effusion.  Talked on phone with Suanne Marker with [facility] and discussed about concern for pericardial or pleural TB.  Will fax them the 2-D echo results.  Patient might need CT scan or thoracentesis for further evaluation- or might not needed as per Dr. Ruffin Frederick recommendations.

## 2013-05-04 ENCOUNTER — Encounter: Payer: Self-pay | Admitting: Dietician

## 2013-05-25 ENCOUNTER — Ambulatory Visit: Payer: No Typology Code available for payment source | Admitting: Internal Medicine

## 2013-05-26 ENCOUNTER — Encounter: Payer: Self-pay | Admitting: Internal Medicine

## 2013-05-26 ENCOUNTER — Ambulatory Visit (INDEPENDENT_AMBULATORY_CARE_PROVIDER_SITE_OTHER): Payer: No Typology Code available for payment source | Admitting: Internal Medicine

## 2013-05-26 VITALS — BP 173/93 | HR 72 | Temp 98.1°F | Wt 141.6 lb

## 2013-05-26 DIAGNOSIS — I872 Venous insufficiency (chronic) (peripheral): Secondary | ICD-10-CM

## 2013-05-26 DIAGNOSIS — E1129 Type 2 diabetes mellitus with other diabetic kidney complication: Secondary | ICD-10-CM

## 2013-05-26 DIAGNOSIS — E1165 Type 2 diabetes mellitus with hyperglycemia: Secondary | ICD-10-CM

## 2013-05-26 DIAGNOSIS — N058 Unspecified nephritic syndrome with other morphologic changes: Secondary | ICD-10-CM

## 2013-05-26 DIAGNOSIS — I10 Essential (primary) hypertension: Secondary | ICD-10-CM

## 2013-05-26 DIAGNOSIS — I8310 Varicose veins of unspecified lower extremity with inflammation: Secondary | ICD-10-CM

## 2013-05-26 DIAGNOSIS — R609 Edema, unspecified: Secondary | ICD-10-CM

## 2013-05-26 DIAGNOSIS — IMO0002 Reserved for concepts with insufficient information to code with codable children: Secondary | ICD-10-CM

## 2013-05-26 DIAGNOSIS — R600 Localized edema: Secondary | ICD-10-CM

## 2013-05-26 LAB — GLUCOSE, CAPILLARY: Glucose-Capillary: 175 mg/dL — ABNORMAL HIGH (ref 70–99)

## 2013-05-26 NOTE — Progress Notes (Signed)
  Subjective:    Patient ID: Joshua [name], male    DOB: February 09, 1951, 62 y.o.   MRN: [id number]  HPI patient is a pleasant 62 year old man with history of pulmonary TB, DM 2, hypertension, venous stasis dermatitis and other problems as per problem list who comes to the clinic for followup visit for his leg swelling and ulcers. The patient reports that he discontinued all of his medications 1 week ago because he felt that his legs had no more swelling and he did not want to continue to lose weight. Furthermore, the patient reports that he has not had an increase in LE swelling since discontinuing his medicines.    Review of Systems  Review of Systems  Constitutional: Negative.   HENT: Negative.   Eyes: Negative.   Respiratory: Negative.   Cardiovascular: Negative.  Negative for leg swelling.  Musculoskeletal: Negative.   Skin: Positive for rash.    See chart for medications.     Objective:   Physical Exam   Physical Exam General: NAD HEENT: PERRL, EOMI, no scleral icterus Cardiac: RRR, no rubs, murmurs or gallops Pulm: clear to auscultation bilaterally, moving normal volumes of air Abd: soft, nontender, nondistended, BS present Ext: warm and well perfused, healed bilateral ulcers. Venous stasis discoloration. Trace pedal edema bilaterally. Neuro: alert and oriented X3, cranial nerves II-XII grossly intact      Assessment & Plan:

## 2013-05-26 NOTE — Patient Instructions (Addendum)
Discontinue lasix.  Restart lisinopril, verapamil, metformin, and glipizide.  Blood Pressure Medicines:  -Lisinopril  -Verapamil  Diabetes Medicines:  -Metformin  -Glipizide  Follow-up for hypertension in 4-6 weeks for re-eval.  Hypertension As your heart beats, it forces blood through your arteries. This force is your blood pressure. If the pressure is too high, it is called hypertension (HTN) or high blood pressure. HTN is dangerous because you may have it and not know it. High blood pressure may mean that your heart has to work harder to pump blood. Your arteries may be narrow or stiff. The extra work puts you at risk for heart disease, stroke, and other problems.  Blood pressure consists of two numbers, a higher number over a lower, 110/72, for example. It is stated as "110 over 72." The ideal is below 120 for the top number (systolic) and under 80 for the bottom (diastolic). Write down your blood pressure today. You should pay close attention to your blood pressure if you have certain conditions such as:  Heart failure.  Prior heart attack.  Diabetes  Chronic kidney disease.  Prior stroke.  Multiple risk factors for heart disease. To see if you have HTN, your blood pressure should be measured while you are seated with your arm held at the level of the heart. It should be measured at least twice. A one-time elevated blood pressure reading (especially in the Emergency Department) does not mean that you need treatment. There may be conditions in which the blood pressure is different between your right and left arms. It is important to see your caregiver soon for a recheck. Most people have essential hypertension which means that there is not a specific cause. This type of high blood pressure may be lowered by changing lifestyle factors such as:  Stress.  Smoking.  Lack of exercise.  Excessive weight.  Drug/tobacco/alcohol use.  Eating less salt. Most people do not have  symptoms from high blood pressure until it has caused damage to the body. Effective treatment can often prevent, delay or reduce that damage. TREATMENT  When a cause has been identified, treatment for high blood pressure is directed at the cause. There are a large number of medications to treat HTN. These fall into several categories, and your caregiver will help you select the medicines that are best for you. Medications may have side effects. You should review side effects with your caregiver. If your blood pressure stays high after you have made lifestyle changes or started on medicines,   Your medication(s) may need to be changed.  Other problems may need to be addressed.  Be certain you understand your prescriptions, and know how and when to take your medicine.  Be sure to follow up with your caregiver within the time frame advised (usually within two weeks) to have your blood pressure rechecked and to review your medications.  If you are taking more than one medicine to lower your blood pressure, make sure you know how and at what times they should be taken. Taking two medicines at the same time can result in blood pressure that is too low. SEEK IMMEDIATE MEDICAL CARE IF:  You develop a severe headache, blurred or changing vision, or confusion.  You have unusual weakness or numbness, or a faint feeling.  You have severe chest or abdominal pain, vomiting, or breathing problems. MAKE SURE YOU:   Understand these instructions.  Will watch your condition.  Will get help right away if you are not doing well  or get worse. Document Released: 11/11/2005 Document Revised: 02/03/2012 Document Reviewed: 07/01/2008 [facility] Patient Information 2014 [place].

## 2013-05-26 NOTE — Progress Notes (Signed)
Case discussed with Dr. Margart Sickles (at time of visit, soon after the resident saw the patient).  We reviewed the resident's history and exam and pertinent patient test results.  I agree with the assessment, diagnosis, and plan of care documented in the resident's note.

## 2013-05-26 NOTE — Assessment & Plan Note (Signed)
Lab Results  Component Value Date   HGBA1C 7.7 03/19/2013   HGBA1C 8.9 11/06/2012     Assessment: Diabetes control: fair control Progress toward A1C goal:  unchanged Comments: Patient stopped taking medicines due to lack of understanding. After discussing his medications in detail, he agreed to continue taking his medications in the future.  Plan: Medications:  continue current medications Home glucose monitoring: Frequency: no home glucose monitoring Timing:   Instruction/counseling given: reminded to get eye exam, reminded to bring medications to each visit and provided printed educational material Educational resources provided: brochure Self management tools provided:   Other plans: Follow-up U/A.

## 2013-05-26 NOTE — Assessment & Plan Note (Addendum)
BP Readings from Last 3 Encounters:  05/26/13 173/93  04/09/13 135/76  04/01/13 145/84    Lab Results  Component Value Date   NA 138 12/04/2012   K 4.0 12/04/2012   CREATININE 0.80 12/04/2012    Assessment: Blood pressure control: moderately elevated Progress toward BP goal:  deteriorated Comments: Patient stopped medication last week because he felt that he was losing too much weight. After discussing his medications, he plans to take his BP medication in the future.  Plan: Medications:  Discontinue lasix Educational resources provided: brochure Self management tools provided: home blood pressure logbook Other plans: Follow-up in 4-6 weeks

## 2013-05-26 NOTE — Assessment & Plan Note (Signed)
Assessment:  The patient has well-healed wounds bilaterally on his LE.  Plan:  Re-evaluate at follow-up in 4-6 weeks.

## 2013-05-27 LAB — MICROALBUMIN / CREATININE URINE RATIO: Creatinine, Urine: 104.3 mg/dL

## 2013-06-03 ENCOUNTER — Other Ambulatory Visit: Payer: Self-pay

## 2013-06-07 NOTE — Progress Notes (Signed)
Case discussed with Dr. Margart Sickles (at time of visit, soon after the resident saw the patient).  We reviewed the resident's history and exam and pertinent patient test results.  I agree with the assessment, diagnosis, and plan of care documented in the resident's note.

## 2013-06-26 ENCOUNTER — Other Ambulatory Visit: Payer: Self-pay | Admitting: Internal Medicine

## 2013-07-14 ENCOUNTER — Ambulatory Visit (INDEPENDENT_AMBULATORY_CARE_PROVIDER_SITE_OTHER): Payer: No Typology Code available for payment source | Admitting: Internal Medicine

## 2013-07-14 ENCOUNTER — Encounter: Payer: Self-pay | Admitting: Internal Medicine

## 2013-07-14 VITALS — BP 141/79 | HR 74 | Temp 96.9°F | Ht 65.5 in | Wt 143.6 lb

## 2013-07-14 DIAGNOSIS — IMO0002 Reserved for concepts with insufficient information to code with codable children: Secondary | ICD-10-CM

## 2013-07-14 DIAGNOSIS — N058 Unspecified nephritic syndrome with other morphologic changes: Secondary | ICD-10-CM

## 2013-07-14 DIAGNOSIS — Z23 Encounter for immunization: Secondary | ICD-10-CM

## 2013-07-14 DIAGNOSIS — I10 Essential (primary) hypertension: Secondary | ICD-10-CM

## 2013-07-14 DIAGNOSIS — E1129 Type 2 diabetes mellitus with other diabetic kidney complication: Secondary | ICD-10-CM

## 2013-07-14 LAB — GLUCOSE, CAPILLARY: Glucose-Capillary: 94 mg/dL (ref 70–99)

## 2013-07-14 NOTE — Assessment & Plan Note (Addendum)
Hypertension - BP 141/79 today, improved and progressing toward goal.  Continue Lisinopril 40mg  daily and Verapamil 40mg  BID.  Return to clinic in 3-6 months for blood pressure check.

## 2013-07-14 NOTE — Patient Instructions (Addendum)
General Instructions:  1. Please schedule an appointment for 3-6 months from now.  2. Please take all medications as prescribed.   3. If you have worsening of your symptoms or new symptoms arise, please call the clinic [phone]), or go to the ER immediately if symptoms are severe.  Treatment Goals:  Goals (1 Years of Data) as of 07/14/13         As of Today 05/26/13 05/26/13 04/09/13 04/01/13     Blood Pressure    . Blood Pressure < 130/80  141/79 173/93 169/100 135/76 145/84     Lifestyle    . Prevent Falls           Result Component    . HEMOGLOBIN A1C < 7.0          . LDL CALC < 100            Progress Toward Treatment Goals:  Treatment Goal 07/14/2013  Hemoglobin A1C unable to assess  Blood pressure improved  Prevent falls at goal    Self Care Goals & Plans:  Self Care Goal 07/14/2013  Manage my medications take my medicines as prescribed; bring my medications to every visit; refill my medications on time  Monitor my health keep track of my blood pressure  Eat healthy foods drink diet soda or water instead of juice or soda; eat more vegetables; eat foods that are low in salt; eat baked foods instead of fried foods  Be physically active find an activity I enjoy  Meeting treatment goals maintain the current self-care plan    Home Blood Glucose Monitoring 07/14/2013  Check my blood sugar (No Data)  When to check my blood sugar before breakfast     Care Management & Community Referrals:  Referral 07/14/2013  Referrals made for care management support none needed  Referrals made to community resources none     Diabetes Meal Planning Guide The diabetes meal planning guide is a tool to help you plan your meals and snacks. It is important for people with diabetes to manage their blood glucose (sugar) levels. Choosing the right foods and the right amounts throughout your day will help control your blood glucose. Eating right can even help you improve your blood pressure and  reach or maintain a healthy weight. CARBOHYDRATE COUNTING MADE EASY When you eat carbohydrates, they turn to sugar. This raises your blood glucose level. Counting carbohydrates can help you control this level so you feel better. When you plan your meals by counting carbohydrates, you can have more flexibility in what you eat and balance your medicine with your food intake. Carbohydrate counting simply means adding up the total amount of carbohydrate grams in your meals and snacks. Try to eat about the same amount at each meal. Foods with carbohydrates are listed below. Each portion below is 1 carbohydrate serving or 15 grams of carbohydrates. Ask your dietician how many grams of carbohydrates you should eat at each meal or snack. Grains and Starches  1 slice bread.   English muffin or hotdog/hamburger bun.   cup cold cereal (unsweetened).   cup cooked pasta or rice.   cup starchy vegetables (corn, potatoes, peas, beans, winter squash).  1 tortilla (6 inches).   bagel.  1 waffle or pancake (size of a CD).   cup cooked cereal.  4 to 6 small crackers. *Whole grain is recommended. Fruit  1 cup fresh unsweetened berries, melon, papaya, pineapple.  1 small fresh fruit.   banana or mango.  cup fruit juice (4 oz unsweetened).   cup canned fruit in natural juice or water.  2 tbs dried fruit.  12 to 15 grapes or cherries. Milk and Yogurt  1 cup fat-free or 1% milk.  1 cup soy milk.  6 oz light yogurt with sugar-free sweetener.  6 oz low-fat soy yogurt.  6 oz plain yogurt. Vegetables  1 cup raw or  cup cooked is counted as 0 carbohydrates or a "free" food.  If you eat 3 or more servings at 1 meal, count them as 1 carbohydrate serving. Other Carbohydrates   oz chips or pretzels.   cup ice cream or frozen yogurt.   cup sherbet or sorbet.  2 inch square cake, no frosting.  1 tbs honey, sugar, jam, jelly, or syrup.  2 small cookies.  3 squares of  graham crackers.  3 cups popcorn.  6 crackers.  1 cup broth-based soup.  Count 1 cup casserole or other mixed foods as 2 carbohydrate servings.  Foods with less than 20 calories in a serving may be counted as 0 carbohydrates or a "free" food. You may want to purchase a book or computer software that lists the carbohydrate gram counts of different foods. In addition, the nutrition facts panel on the labels of the foods you eat are a good source of this information. The label will tell you how big the serving size is and the total number of carbohydrate grams you will be eating per serving. Divide this number by 15 to obtain the number of carbohydrate servings in a portion. Remember, 1 carbohydrate serving equals 15 grams of carbohydrate. SERVING SIZES Measuring foods and serving sizes helps you make sure you are getting the right amount of food. The list below tells how big or small some common serving sizes are.  1 oz.........4 stacked dice.  3 oz........Marland KitchenDeck of cards.  1 tsp.......Marland KitchenTip of little finger.  1 tbs......Marland KitchenMarland KitchenThumb.  2 tbs.......Marland KitchenGolf ball.   cup......Marland KitchenHalf of a fist.  1 cup.......Marland KitchenA fist. SAMPLE DIABETES MEAL PLAN Below is a sample meal plan that includes foods from the grain and starches, dairy, vegetable, fruit, and meat groups. A dietician can individualize a meal plan to fit your calorie needs and tell you the number of servings needed from each food group. However, controlling the total amount of carbohydrates in your meal or snack is more important than making sure you include all of the food groups at every meal. You may interchange carbohydrate containing foods (dairy, starches, and fruits). The meal plan below is an example of a 2000 calorie diet using carbohydrate counting. This meal plan has 17 carbohydrate servings. Breakfast  1 cup oatmeal (2 carb servings).   cup light yogurt (1 carb serving).  1 cup blueberries (1 carb serving).   cup  almonds. Snack  1 large apple (2 carb servings).  1 low-fat string cheese stick. Lunch  Chicken breast salad.  1 cup spinach.   cup chopped tomatoes.  2 oz chicken breast, sliced.  2 tbs low-fat [place] dressing.  12 whole-wheat crackers (2 carb servings).  12 to 15 grapes (1 carb serving).  1 cup low-fat milk (1 carb serving). Snack  1 cup carrots.   cup hummus (1 carb serving). Dinner  3 oz broiled salmon.  1 cup brown rice (3 carb servings). Snack  1  cups steamed broccoli (1 carb serving) drizzled with 1 tsp olive oil and lemon juice.  1 cup light pudding (2 carb servings). DIABETES MEAL PLANNING WORKSHEET  Your dietician can use this worksheet to help you decide how many servings of foods and what types of foods are right for you.  BREAKFAST Food Group and Servings / Carb Servings Grain/Starches __________________________________ Dairy __________________________________________ Vegetable ______________________________________ Fruit ___________________________________________ Meat __________________________________________ Fat ____________________________________________ LUNCH Food Group and Servings / Carb Servings Grain/Starches ___________________________________ Dairy ___________________________________________ Fruit ____________________________________________ Meat ___________________________________________ Fat _____________________________________________ Joshua [name] Food Group and Servings / Carb Servings Grain/Starches ___________________________________ Dairy ___________________________________________ Fruit ____________________________________________ Meat ___________________________________________ Fat _____________________________________________ SNACKS Food Group and Servings / Carb Servings Grain/Starches ___________________________________ Dairy ___________________________________________ Vegetable  _______________________________________ Fruit ____________________________________________ Meat ___________________________________________ Fat _____________________________________________ DAILY TOTALS Starches _________________________ Vegetable ________________________ Fruit ____________________________ Dairy ____________________________ Meat ____________________________ Fat ______________________________ Document Released: 08/08/2005 Document Revised: 02/03/2012 Document Reviewed: 06/19/2009 Exit[employer] Patient Information 2014 [employer]

## 2013-07-14 NOTE — Assessment & Plan Note (Addendum)
DM - HgbA1C 6.6 today.  His diabetes is well controlled as he has met his A1C goal (<7.0). Continue Glipizide 5mg  BID and Metformin 500mg  BID.  Return to clinic in 3-6 months for routine health maintenance. Educational resource provided: Diabetes Meal Planning Guide attached to AVS.

## 2013-07-14 NOTE — Progress Notes (Signed)
  Subjective:    Patient ID: Joshua [name], male    DOB: Jun 03, 1951, 62 y.o.   MRN: [id number]  HPI Comments: Joshua [name] is a 62 year old male with a PMH of HTN, DM type 2 (last HgbA1C 7.7 in April 2014) and CKD 1 who is here for follow-up of his HTN and DM.  He denies complaint at this visit.  He infrequently checks his blood pressure at home but reports compliance with his medications.  He checks his blood glucose a few times per week and the most recent check was 113 three days ago.   He reports one episode of low blood glucose (60) last week.  He felt dizzy and sweaty but immediately drank a sweet beverage and his symptoms improved.  He says his blood sugars have otherwise run between 90 and 110s before meals.     Review of Systems  Constitutional: Negative for appetite change, fatigue and unexpected weight change.  HENT: Negative for hearing loss.   Eyes: Negative for visual disturbance.  Respiratory: Negative for shortness of breath.   Cardiovascular: Negative for chest pain and palpitations.  Gastrointestinal: Negative for abdominal pain, diarrhea, constipation and blood in stool.  Genitourinary: Negative for dysuria, frequency and hematuria.  Neurological: Negative for weakness and headaches.       Objective:   Physical Exam  Constitutional: He is oriented to person, place, and time. He appears well-nourished. No distress.  HENT:  Head: Normocephalic and atraumatic.  Mouth/Throat: Oropharynx is clear and moist.  Eyes: EOM are normal. Pupils are equal, round, and reactive to light.  Neck: Neck supple.  Cardiovascular: Normal rate, regular rhythm and normal heart sounds.   No murmur heard. Pulmonary/Chest: Effort normal. He has no wheezes. He has no rales.  Decreased BS left base  Abdominal: Soft. Bowel sounds are normal. He exhibits no distension. There is no tenderness.  Musculoskeletal: Normal range of motion.  +1 lower extremity edema  Neurological: He is alert and  oriented to person, place, and time.  Skin: He is not diaphoretic.  Psychiatric: He has a normal mood and affect. His behavior is normal.       Assessment & Plan:  62 year old male with PMH of HTN, DM type 2 and CKD1 here for follow-up HTN and DM.  1. Hypertension - BP 141/79 today, improved and progressing toward goal.  Continue Lisinopril 40mg  daily and Verapamil 40mg  BID.  2. DM - HgbA1C 6.6 today.  His diabetes is well controlled as he has met his A1C goal (<7.0). Continue Glipizide 5mg  BID and Metformin 500mg  BID.  Return to clinic in 3-6 months for routine health maintenance. Educational resource provided: Diabetes Meal Planning Guide attached to AVS.

## 2013-07-15 NOTE — Progress Notes (Signed)
I saw and evaluated the patient.  I personally confirmed the key portions of the history and exam documented by Dr. Wilson and I reviewed pertinent patient test results.  The assessment, diagnosis, and plan were formulated together and I agree with the documentation in the resident's note. 

## 2013-08-20 ENCOUNTER — Other Ambulatory Visit: Payer: Self-pay | Admitting: Internal Medicine

## 2013-08-23 ENCOUNTER — Other Ambulatory Visit: Payer: Self-pay | Admitting: *Deleted

## 2013-08-23 DIAGNOSIS — E119 Type 2 diabetes mellitus without complications: Secondary | ICD-10-CM

## 2013-08-23 DIAGNOSIS — I10 Essential (primary) hypertension: Secondary | ICD-10-CM

## 2013-08-23 DIAGNOSIS — E1121 Type 2 diabetes mellitus with diabetic nephropathy: Secondary | ICD-10-CM

## 2013-08-26 MED ORDER — LISINOPRIL 40 MG PO TABS: 40.0000 mg | ORAL_TABLET | Freq: Every day | ORAL | Status: DC

## 2013-09-20 ENCOUNTER — Other Ambulatory Visit: Payer: Self-pay | Admitting: Internal Medicine

## 2013-11-05 ENCOUNTER — Ambulatory Visit (INDEPENDENT_AMBULATORY_CARE_PROVIDER_SITE_OTHER): Payer: No Typology Code available for payment source | Admitting: Internal Medicine

## 2013-11-05 ENCOUNTER — Encounter: Payer: Self-pay | Admitting: Internal Medicine

## 2013-11-05 ENCOUNTER — Telehealth: Payer: Self-pay | Admitting: Dietician

## 2013-11-05 VITALS — BP 166/88 | HR 94 | Temp 98.1°F | Ht 64.0 in | Wt 161.3 lb

## 2013-11-05 DIAGNOSIS — E1149 Type 2 diabetes mellitus with other diabetic neurological complication: Secondary | ICD-10-CM

## 2013-11-05 DIAGNOSIS — R600 Localized edema: Secondary | ICD-10-CM

## 2013-11-05 DIAGNOSIS — Z0000 Encounter for general adult medical examination without abnormal findings: Secondary | ICD-10-CM

## 2013-11-05 DIAGNOSIS — R609 Edema, unspecified: Secondary | ICD-10-CM

## 2013-11-05 DIAGNOSIS — E1129 Type 2 diabetes mellitus with other diabetic kidney complication: Secondary | ICD-10-CM

## 2013-11-05 DIAGNOSIS — E1140 Type 2 diabetes mellitus with diabetic neuropathy, unspecified: Secondary | ICD-10-CM

## 2013-11-05 DIAGNOSIS — N058 Unspecified nephritic syndrome with other morphologic changes: Secondary | ICD-10-CM

## 2013-11-05 DIAGNOSIS — E1121 Type 2 diabetes mellitus with diabetic nephropathy: Secondary | ICD-10-CM

## 2013-11-05 DIAGNOSIS — E1142 Type 2 diabetes mellitus with diabetic polyneuropathy: Secondary | ICD-10-CM

## 2013-11-05 DIAGNOSIS — I10 Essential (primary) hypertension: Secondary | ICD-10-CM

## 2013-11-05 MED ORDER — GABAPENTIN 300 MG PO CAPS: ORAL_CAPSULE | ORAL | Status: DC

## 2013-11-05 NOTE — Telephone Encounter (Signed)
Pt called to let her know [facility] can call and have the Rx transferred to them.  No answer. Message left on answer machine to call clinic.

## 2013-11-05 NOTE — Assessment & Plan Note (Addendum)
The patient states that in October he rubbed his skin raw with sandpaper that he got from the store to help release fluid from his body to help him breath. He says that since that time the wounds have been weeping clear fluid. He denies any infection, purulence from the wounds, fevers or chills, or nausea vomiting. He states he is taking his legs and a Betadine solution every other day he keeps them wrapped with clean bandages. He was unwilling to remove the bandages today, because he states that it takes an hour to reapply them. He states he is willing to come in next week to show me his legs. Because he has refused to unwrap his legs, and here in the clinic his vital signs are stable, however and followup within one week for a wound check.

## 2013-11-05 NOTE — Assessment & Plan Note (Signed)
BP Readings from Last 3 Encounters:  11/05/13 166/88  07/14/13 141/79  05/26/13 173/93    Lab Results  Component Value Date   NA 138 12/04/2012   K 4.0 12/04/2012   CREATININE 0.80 12/04/2012    Assessment: Blood pressure control: moderately elevated Progress toward BP goal:    Comments: Pt only taking the Verapamil qday. Endorse compliance with his lisinopril  Plan: Medications:  continue current medications, explained to the patient that all his medications are only for all her formulary list. He states that he will make sure to take the verapamil twice a day and continue to take the lisinopril daily Educational resources provided: brochure;handout;video Self management tools provided:   Other plans: F/u in 1 week

## 2013-11-05 NOTE — Assessment & Plan Note (Signed)
Patient complained of burning pain in his bilateral lower extremities from his thighs down to his feet. He states his left lower extremity is worse than the right. Sensation is intact on exam and his bilateral thighs. He denies any pain while at rest, but states that once he begins to ambulate, after a few minutes he begins having the burning pain. Was unable to examine distal to his knees as he was unwilling to unwrap his legs, but only remove his right shoe. Right foot sensation is intact, except on the dorsum of his foot which has decreased sensation. His right foot is without ulcerations. Starting gabapentin 300 mg which the patient is to uptitrate every 3 days to 300 mg 3 times a day.

## 2013-11-05 NOTE — Assessment & Plan Note (Signed)
Lab Results  Component Value Date   HGBA1C 6.6 07/14/2013   HGBA1C 7.7 03/19/2013   HGBA1C 8.9 11/06/2012     Assessment: Diabetes control:  (pt refusing A1c) Progress toward A1C goal:  unable to assess Comments: The patient is only taking his glipizide and metformin once a day because he states the medications are causing him over $100. However his medicines are all portal formulary which was brought to the patient's attention.  Plan: Medications:  continue current medications, and be sure to take both medications twice a day. Home glucose monitoring: Frequency: no home glucose monitoring Timing:   Instruction/counseling given: reminded to bring blood glucose meter & log to each visit and reminded to bring medications to each visit Educational resources provided: brochure;handout Self management tools provided:   Other plans: He is to followup in one week. I have asked him to check his blood sugars every morning before breakfast and to bring his meter with him to his next visit

## 2013-11-05 NOTE — Telephone Encounter (Signed)
I called patient to ask him that name of his eye doctor- he said it is a Dr. Emmaline Life towards [facility]. He also wants to discuss his Gabapentin that cost him 88$ at [facility] after a 12$ discount. Asked him to call back and speak to our nurse. He said he would when he got to work.   Per Dr. Eulas Post- patient was told if the medicine was more than 4$ to call us and we'd [facility]. [facility] or if he has already filled it, he can transfer the prescription to [place].

## 2013-11-05 NOTE — Patient Instructions (Addendum)
Please begin taking your Verapamil for your blood pressure and the Gipizide and Metformin 2 times a day as prescribed. These are all on the $4 formulary, so you should be able to afford them.   Check your blood sugars once a day before breakfast and bring your meter with you.  Please come back to the clinic next week so I can look at your wounds.  I am prescribing gabapentin for the burning pain you are having in your legs. Take one tablet daily for 3 days, then 1 tablet 2 times a day for 3 days, and thne 1 tablet 3 times a day.    Gabapentin capsules or tablets What is this medicine? GABAPENTIN (GA ba pen tin) is used to control partial seizures in adults with epilepsy. It is also used to treat certain types of nerve pain. This medicine may be used for other purposes; ask your health care provider or pharmacist if you have questions. COMMON BRAND NAME(S): Orpha Bur , Neurontin What should I tell my health care provider before I take this medicine? They need to know if you have any of these conditions: -kidney disease -suicidal thoughts, plans, or attempt; a previous suicide attempt by you or a family member -an unusual or allergic reaction to gabapentin, other medicines, foods, dyes, or preservatives -pregnant or trying to get pregnant -breast-feeding How should I use this medicine? Take this medicine by mouth. Swallow it with a drink of water. Follow the directions on the prescription label. If this medicine upsets your stomach, take it with food or milk. Take your medicine at regular intervals. Do not take it more often than directed. If you are directed to break the 600 or 800 mg tablets in half as part of your dose, the extra half tablet should be used for the next dose. If you have not used the extra half tablet within 3 days, it should be thrown away. A special MedGuide will be given to you by the pharmacist with each prescription and refill. Be sure to read this information carefully  each time. Talk to your pediatrician regarding the use of this medicine in children. Special care may be needed. Overdosage: If you think you have taken too much of this medicine contact a poison control center or emergency room at once. NOTE: This medicine is only for you. Do not share this medicine with others. What if I miss a dose? If you miss a dose, take it as soon as you can. If it is almost time for your next dose, take only that dose. Do not take double or extra doses. What may interact with this medicine? Do not take this medicine with any of the following medications: -other gabapentin products This medicine may also interact with the following medications: -alcohol -antacids -antihistamines for allergy, cough and cold -certain medicines for anxiety or sleep -certain medicines for depression or psychotic disturbances -homatropine; hydrocodone -naproxen -narcotic medicines (opiates) for pain -phenothiazines like chlorpromazine, mesoridazine, prochlorperazine, thioridazine This list may not describe all possible interactions. Give your health care provider a list of all the medicines, herbs, non-prescription drugs, or dietary supplements you use. Also tell them if you smoke, drink alcohol, or use illegal drugs. Some items may interact with your medicine. What should I watch for while using this medicine? Visit your doctor or health care professional for regular checks on your progress. You may want to keep a record at home of how you feel your condition is responding to treatment. You may want  to share this information with your doctor or health care professional at each visit. You should contact your doctor or health care professional if your seizures get worse or if you have any new types of seizures. Do not stop taking this medicine or any of your seizure medicines unless instructed by your doctor or health care professional. Stopping your medicine suddenly can increase your seizures  or their severity. Wear a medical identification bracelet or chain if you are taking this medicine for seizures, and carry a card that lists all your medications. You may get drowsy, dizzy, or have blurred vision. Do not drive, use machinery, or do anything that needs mental alertness until you know how this medicine affects you. To reduce dizzy or fainting spells, do not sit or stand up quickly, especially if you are an older patient. Alcohol can increase drowsiness and dizziness. Avoid alcoholic drinks. Your mouth may get dry. Chewing sugarless gum or sucking hard candy, and drinking plenty of water will help. The use of this medicine may increase the chance of suicidal thoughts or actions. Pay special attention to how you are responding while on this medicine. Any worsening of mood, or thoughts of suicide or dying should be reported to your health care professional right away. Women who become pregnant while using this medicine may enroll in the [place] Pregnancy Registry by calling [id number]. This registry collects information about the safety of antiepileptic drug use during pregnancy. What side effects may I notice from receiving this medicine? Side effects that you should report to your doctor or health care professional as soon as possible: -allergic reactions like skin rash, itching or hives, swelling of the face, lips, or tongue -worsening of mood, thoughts or actions of suicide or dying Side effects that usually do not require medical attention (report to your doctor or health care professional if they continue or are bothersome): -constipation -difficulty walking or controlling muscle movements -dizziness -nausea -slurred speech -tiredness -tremors -weight gain This list may not describe all possible side effects. Call your doctor for medical advice about side effects. You may report side effects to FDA at 1-800-FDA-1088. Where should I keep my  medicine? Keep out of reach of children. Store at room temperature between 15 and 30 degrees C (59 and 86 degrees F). Throw away any unused medicine after the expiration date. NOTE: This sheet is a summary. It may not cover all possible information. If you have questions about this medicine, talk to your doctor, pharmacist, or health care provider.  2014, [employer]. (2011-12-04 11:43:23)

## 2013-11-05 NOTE — Assessment & Plan Note (Signed)
Today the patient is refusing all over to health maintenance items, including a lipid panel and an A1c.
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
## 2013-11-06 NOTE — Progress Notes (Signed)
Case discussed with Dr. Glenn at the time of the visit.  We reviewed the resident's history and exam and pertinent patient test results.  I agree with the assessment, diagnosis, and plan of care documented in the resident's note.   

## 2013-11-12 ENCOUNTER — Ambulatory Visit: Payer: No Typology Code available for payment source | Admitting: Internal Medicine

## 2013-11-26 ENCOUNTER — Ambulatory Visit (INDEPENDENT_AMBULATORY_CARE_PROVIDER_SITE_OTHER): Payer: Self-pay | Admitting: Internal Medicine

## 2013-11-26 ENCOUNTER — Other Ambulatory Visit: Payer: Self-pay | Admitting: Internal Medicine

## 2013-11-26 ENCOUNTER — Encounter: Payer: Self-pay | Admitting: Internal Medicine

## 2013-11-26 VITALS — BP 167/88 | HR 81 | Temp 98.0°F | Wt 154.3 lb

## 2013-11-26 DIAGNOSIS — E1140 Type 2 diabetes mellitus with diabetic neuropathy, unspecified: Secondary | ICD-10-CM

## 2013-11-26 DIAGNOSIS — I10 Essential (primary) hypertension: Secondary | ICD-10-CM

## 2013-11-26 DIAGNOSIS — E1142 Type 2 diabetes mellitus with diabetic polyneuropathy: Secondary | ICD-10-CM

## 2013-11-26 DIAGNOSIS — E1129 Type 2 diabetes mellitus with other diabetic kidney complication: Secondary | ICD-10-CM

## 2013-11-26 DIAGNOSIS — S91309A Unspecified open wound, unspecified foot, initial encounter: Secondary | ICD-10-CM

## 2013-11-26 DIAGNOSIS — S91302A Unspecified open wound, left foot, initial encounter: Secondary | ICD-10-CM

## 2013-11-26 DIAGNOSIS — X58XXXA Exposure to other specified factors, initial encounter: Secondary | ICD-10-CM

## 2013-11-26 DIAGNOSIS — S91332A Puncture wound without foreign body, left foot, initial encounter: Secondary | ICD-10-CM

## 2013-11-26 DIAGNOSIS — E1121 Type 2 diabetes mellitus with diabetic nephropathy: Secondary | ICD-10-CM

## 2013-11-26 DIAGNOSIS — E119 Type 2 diabetes mellitus without complications: Secondary | ICD-10-CM

## 2013-11-26 DIAGNOSIS — N058 Unspecified nephritic syndrome with other morphologic changes: Secondary | ICD-10-CM

## 2013-11-26 DIAGNOSIS — IMO0002 Reserved for concepts with insufficient information to code with codable children: Secondary | ICD-10-CM

## 2013-11-26 DIAGNOSIS — E1149 Type 2 diabetes mellitus with other diabetic neurological complication: Secondary | ICD-10-CM

## 2013-11-26 DIAGNOSIS — I8312 Varicose veins of left lower extremity with inflammation: Secondary | ICD-10-CM

## 2013-11-26 DIAGNOSIS — I8310 Varicose veins of unspecified lower extremity with inflammation: Secondary | ICD-10-CM

## 2013-11-26 DIAGNOSIS — E1165 Type 2 diabetes mellitus with hyperglycemia: Principal | ICD-10-CM

## 2013-11-26 LAB — POCT GLYCOSYLATED HEMOGLOBIN (HGB A1C): Hemoglobin A1C: 6.5

## 2013-11-26 LAB — GLUCOSE, CAPILLARY: Glucose-Capillary: 173 mg/dL — ABNORMAL HIGH (ref 70–99)

## 2013-11-26 MED ORDER — VERAPAMIL HCL ER 120 MG PO TBCR: 120.0000 mg | EXTENDED_RELEASE_TABLET | Freq: Every day | ORAL | Status: DC

## 2013-11-26 MED ORDER — METFORMIN HCL 500 MG PO TABS: 1000.0000 mg | ORAL_TABLET | Freq: Two times a day (BID) | ORAL | Status: DC

## 2013-11-26 MED ORDER — LISINOPRIL 40 MG PO TABS: 40.0000 mg | ORAL_TABLET | Freq: Every day | ORAL | Status: DC

## 2013-11-26 NOTE — Progress Notes (Signed)
   Subjective:    Patient ID: Joshua [name], male    DOB: [birth year], 62 y.o.   MRN: [id number]  HPI Comments: Joshua [name] is a 63 year old male with a PMH of HTN, DM type 2 (Hgb A1c 6.5 today), CKD 1 and chronic venous stasis who is here for follow-up of his DM and HTN.  He also has wounds on the left leg that he has been caring for at home after scrubbing his legs with sandpaper to release fluid a few months ago.  He has been cleaning his legs with betadine and wrapping them every other day.  He denies fever, chills, N/V, change in appetite, CP or SOB.    He rarely checks his blood glucose but reports compliance with Glipizide and Metformin.  He denies hypoglycemic symptoms.  His Hgb A1c is 6.5 today.  He reports compliance with Lisinopril and verapamil.  His blood pressure is elevated at 167/71mmHg and has been elevated at the past few visits.       Review of Systems  Constitutional: Negative for fever, chills and appetite change.  Eyes: Negative for visual disturbance.  Respiratory: Negative for shortness of breath.   Cardiovascular: Negative for chest pain.  Gastrointestinal: Negative for nausea, vomiting and diarrhea.  Skin: Positive for wound.       Objective:   Physical Exam  Vitals reviewed. Constitutional: He is oriented to person, place, and time. No distress.  HENT:  Mouth/Throat: Oropharynx is clear and moist. No oropharyngeal exudate.  Eyes: Pupils are equal, round, and reactive to light. No scleral icterus.  Neck: Neck supple. No JVD present.  Cardiovascular: Normal rate, regular rhythm and normal heart sounds.   Pulmonary/Chest: Effort normal and breath sounds normal. No respiratory distress. He has no wheezes. He has no rales.  Abdominal: Soft. Bowel sounds are normal. He exhibits no distension. There is no tenderness.  Musculoskeletal: He exhibits no edema.  Neurological: He is alert and oriented to person, place, and time.  Skin: He is not diaphoretic.  Well  healed wounds on the right leg.  Healing wounds on left left with the largest ~ 1.5 inches in diameter at the back lower leg.  No purulent drainage or increasing warmth.   ~1 inch healing wound on sole of foot where he has tried to remove a callous.  No purulent drainage.      Psychiatric: He has a normal mood and affect. His behavior is normal.          Assessment & Plan:  Please see problem based assessment and plan.

## 2013-11-26 NOTE — Patient Instructions (Signed)
General Instructions: 1.Please continue to wrap your leg with clean bandages.  If experience worsening of your wounds, increased pain, discharge from wounds,  fever or chills please call the clinic.  If you are severely ill please go to the ED.  Please return to clinic in 1 month.   2. Please take all medications as prescribed.  Stop taking Verapemil 40mg  twice per day.  Start taking Verapemil 120mg  daily.  I will send all of prescriptions to [facility] as you requested.    3. If you have worsening of your symptoms or new symptoms arise, please call the clinic [phone]), or go to the ER immediately if symptoms are severe.  Treatment Goals:  Goals (1 Years of Data) as of 11/26/13         As of Today 11/05/13 11/05/13 07/14/13 05/26/13     Blood Pressure    . Blood Pressure < 130/80  167/88 166/88 178/100 141/79 173/93     Lifestyle    . Prevent Falls           Result Component    . HEMOGLOBIN A1C < 7.0  6.5   6.6     . LDL CALC < 100            Progress Toward Treatment Goals:  Treatment Goal 11/26/2013  Hemoglobin A1C improved  Blood pressure -  Prevent falls -    Self Care Goals & Plans:  Self Care Goal 11/26/2013  Manage my medications take my medicines as prescribed  Monitor my health keep track of my blood glucose  Eat healthy foods eat more vegetables; eat foods that are low in salt  Be physically active -  Meeting treatment goals -    Home Blood Glucose Monitoring 11/26/2013  Check my blood sugar (No Data)  When to check my blood sugar -     Care Management & Community Referrals:  Referral 07/14/2013  Referrals made for care management support none needed  Referrals made to community resources none

## 2013-11-28 DIAGNOSIS — S91302A Unspecified open wound, left foot, initial encounter: Secondary | ICD-10-CM | POA: Insufficient documentation

## 2013-11-28 DIAGNOSIS — S91332A Puncture wound without foreign body, left foot, initial encounter: Secondary | ICD-10-CM | POA: Insufficient documentation

## 2013-11-28 NOTE — Assessment & Plan Note (Addendum)
Assessment:  Joshua [name] says he was trying to remove a callous from the bottom of his foot and developed this wound.  It looks like it is healing and is without purulent drainage.  He would benefit from wound care but he refuses referral.  Plan:  1) Keep wound clean           2) Instructed him not to cut/clip anything on his feet because of his DM           3) Return to clinic in 1 month for follow-up           4) Go to the ED with new or worsening symptoms, including increased pain or discharge, fever or chills

## 2013-11-28 NOTE — Assessment & Plan Note (Addendum)
BP Readings from Last 3 Encounters:  11/26/13 167/88  11/05/13 166/88  07/14/13 141/79    Lab Results  Component Value Date   NA 138 12/04/2012   K 4.0 12/04/2012   CREATININE 0.80 12/04/2012    Assessment: Blood pressure control: moderately elevated Progress toward BP goal:   deteriorating  Plan: Medications:  Continue Lisinopril 40mg  daily; change verapamil from 40mg  BID to 120mg  daily. Return to clinic in 1 month for follow-up

## 2013-11-28 NOTE — Assessment & Plan Note (Addendum)
Assessment:  Patient rubbed his legs with sandpaper a few months ago to release fluid.  His right leg has healed.  His continues to clean the left leg with Betadine and wrap it with bandages every other day.  He denies fever/chills and the wounds do not look infected.  He refuses referral to wound care.  Plan:   1) continue to wrap left leg with clean bandages            2) return to clinic in 1 month for follow-up            3) go the ED if he develops new or worsening symptoms, including increased pain or discharge from wounds, fever or chills

## 2013-11-28 NOTE — Assessment & Plan Note (Signed)
Lab Results  Component Value Date   HGBA1C 6.5 11/26/2013   HGBA1C 6.6 07/14/2013   HGBA1C 7.7 03/19/2013     Assessment: Diabetes control: good control (HgbA1C at goal) Progress toward A1C goal:  improved  Plan: Medications:  Glipizide 5mg  BID and Metformin 1000mg  BID Home glucose monitoring: Frequency:  daily Other plans: Will send prescriptions to [facility] per patient request since medications are generally cheaper there.

## 2013-11-30 ENCOUNTER — Telehealth: Payer: Self-pay | Admitting: Internal Medicine

## 2013-11-30 NOTE — Telephone Encounter (Signed)
I tried to return Joshua [name]'s phone call yesterday but only got his voicemail.  I called back today and spoke with him.  He says he had called because he had some diarrhea when he started the increased dose of verapamil last Friday.  He says he has continued taking the verapamil and has not had diarrhea for the past two days.  He denies abdominal pain or fever.  He says he is fine now.

## 2013-12-07 NOTE — Progress Notes (Signed)
I saw and evaluated the patient.  I personally confirmed the key portions of the history and exam documented by Dr. Wilson and I reviewed pertinent patient test results.  The assessment, diagnosis, and plan were formulated together and I agree with the documentation in the resident's note. 

## 2013-12-16 ENCOUNTER — Encounter: Payer: No Typology Code available for payment source | Admitting: Internal Medicine

## 2013-12-26 DIAGNOSIS — I8290 Acute embolism and thrombosis of unspecified vein: Secondary | ICD-10-CM

## 2013-12-26 HISTORY — DX: Acute embolism and thrombosis of unspecified vein: I82.90

## 2014-01-05 ENCOUNTER — Telehealth: Payer: Self-pay | Admitting: Internal Medicine

## 2014-01-05 ENCOUNTER — Ambulatory Visit (HOSPITAL_COMMUNITY)
Admission: RE | Admit: 2014-01-05 | Discharge: 2014-01-05 | Disposition: A | Discharge status: Home / Self Care | Payer: PRIVATE HEALTH INSURANCE | Source: Ambulatory Visit | Attending: Internal Medicine | Admitting: Internal Medicine

## 2014-01-05 ENCOUNTER — Encounter: Payer: Self-pay | Admitting: Internal Medicine

## 2014-01-05 ENCOUNTER — Ambulatory Visit (INDEPENDENT_AMBULATORY_CARE_PROVIDER_SITE_OTHER): Payer: PRIVATE HEALTH INSURANCE | Admitting: Internal Medicine

## 2014-01-05 VITALS — BP 180/101 | HR 104 | Temp 97.0°F | Ht 63.0 in | Wt 150.2 lb

## 2014-01-05 DIAGNOSIS — E118 Type 2 diabetes mellitus with unspecified complications: Secondary | ICD-10-CM

## 2014-01-05 DIAGNOSIS — M79606 Pain in leg, unspecified: Secondary | ICD-10-CM

## 2014-01-05 DIAGNOSIS — E1121 Type 2 diabetes mellitus with diabetic nephropathy: Secondary | ICD-10-CM

## 2014-01-05 DIAGNOSIS — R0600 Dyspnea, unspecified: Secondary | ICD-10-CM

## 2014-01-05 DIAGNOSIS — S91332A Puncture wound without foreign body, left foot, initial encounter: Secondary | ICD-10-CM

## 2014-01-05 DIAGNOSIS — N058 Unspecified nephritic syndrome with other morphologic changes: Secondary | ICD-10-CM

## 2014-01-05 DIAGNOSIS — E1129 Type 2 diabetes mellitus with other diabetic kidney complication: Secondary | ICD-10-CM

## 2014-01-05 DIAGNOSIS — R0989 Other specified symptoms and signs involving the circulatory and respiratory systems: Secondary | ICD-10-CM

## 2014-01-05 DIAGNOSIS — K219 Gastro-esophageal reflux disease without esophagitis: Secondary | ICD-10-CM | POA: Insufficient documentation

## 2014-01-05 DIAGNOSIS — G8929 Other chronic pain: Secondary | ICD-10-CM | POA: Insufficient documentation

## 2014-01-05 DIAGNOSIS — M79609 Pain in unspecified limb: Secondary | ICD-10-CM

## 2014-01-05 DIAGNOSIS — I10 Essential (primary) hypertension: Secondary | ICD-10-CM

## 2014-01-05 DIAGNOSIS — Z0000 Encounter for general adult medical examination without abnormal findings: Secondary | ICD-10-CM

## 2014-01-05 DIAGNOSIS — S91309A Unspecified open wound, unspecified foot, initial encounter: Secondary | ICD-10-CM

## 2014-01-05 DIAGNOSIS — R0609 Other forms of dyspnea: Secondary | ICD-10-CM

## 2014-01-05 LAB — CBC WITH DIFFERENTIAL/PLATELET
BASOS PCT: 0 % (ref 0–1)
Basophils Absolute: 0 10*3/uL (ref 0.0–0.1)
Eosinophils Absolute: 0.1 10*3/uL (ref 0.0–0.7)
Eosinophils Relative: 1 % (ref 0–5)
HCT: 28.8 % — ABNORMAL LOW (ref 39.0–52.0)
Hemoglobin: 9.3 g/dL — ABNORMAL LOW (ref 13.0–17.0)
Lymphocytes Relative: 10 % — ABNORMAL LOW (ref 12–46)
Lymphs Abs: 1.5 10*3/uL (ref 0.7–4.0)
MCH: 23.9 pg — ABNORMAL LOW (ref 26.0–34.0)
MCHC: 32.3 g/dL (ref 30.0–36.0)
MCV: 74 fL — ABNORMAL LOW (ref 78.0–100.0)
MONO ABS: 1.5 10*3/uL — AB (ref 0.1–1.0)
Monocytes Relative: 11 % (ref 3–12)
Neutro Abs: 11.1 10*3/uL — ABNORMAL HIGH (ref 1.7–7.7)
Neutrophils Relative %: 79 % — ABNORMAL HIGH (ref 43–77)
PLATELETS: 439 10*3/uL — AB (ref 150–400)
RBC: 3.89 MIL/uL — ABNORMAL LOW (ref 4.22–5.81)
RDW: 17.4 % — AB (ref 11.5–15.5)
WBC: 14.1 10*3/uL — ABNORMAL HIGH (ref 4.0–10.5)

## 2014-01-05 LAB — COMPLETE METABOLIC PANEL WITH GFR
ALBUMIN: 2 g/dL — AB (ref 3.5–5.2)
ALK PHOS: 179 U/L — AB (ref 39–117)
ALT: 33 U/L (ref 0–53)
AST: 32 U/L (ref 0–37)
BUN: 27 mg/dL — AB (ref 6–23)
CO2: 25 mEq/L (ref 19–32)
Calcium: 8.7 mg/dL (ref 8.4–10.5)
Chloride: 103 mEq/L (ref 96–112)
Creat: 1.14 mg/dL (ref 0.50–1.35)
GFR, Est African American: 79 mL/min
GFR, Est Non African American: 69 mL/min
Glucose, Bld: 118 mg/dL — ABNORMAL HIGH (ref 70–99)
POTASSIUM: 4.4 meq/L (ref 3.5–5.3)
SODIUM: 140 meq/L (ref 135–145)
Total Bilirubin: 0.3 mg/dL (ref 0.3–1.2)
Total Protein: 7.3 g/dL (ref 6.0–8.3)

## 2014-01-05 LAB — GLUCOSE, CAPILLARY: GLUCOSE-CAPILLARY: 144 mg/dL — AB (ref 70–99)

## 2014-01-05 LAB — LIPID PANEL
Cholesterol: 175 mg/dL (ref 0–200)
HDL: 83 mg/dL (ref >39)
LDL Cholesterol: 81 mg/dL (ref 0–99)
TRIGLYCERIDES: 53 mg/dL (ref <150)
Total CHOL/HDL Ratio: 2.1 Ratio
VLDL: 11 mg/dL (ref 0–40)

## 2014-01-05 LAB — SEDIMENTATION RATE: SED RATE: 96 mm/h — AB (ref 0–16)

## 2014-01-05 MED ORDER — HYDROCHLOROTHIAZIDE 12.5 MG PO CAPS: 12.5000 mg | ORAL_CAPSULE | Freq: Every day | ORAL | Status: DC

## 2014-01-05 MED ORDER — VERAPAMIL HCL ER 180 MG PO TBCR: 180.0000 mg | EXTENDED_RELEASE_TABLET | Freq: Every day | ORAL | Status: DC
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
## 2014-01-05 NOTE — Patient Instructions (Addendum)
1. Please return to clinic tomorrow so we can review your xrays and labs.     2. Please take all medications as prescribed.  Pick up new prescription for hydrochlorothiazide and verapemil.  Keep taking lisinopril.  These medications will help your blood pressure.   3. If you have worsening of your symptoms or new symptoms arise, please call the clinic [phone]), or go to the ER immediately if symptoms are severe.

## 2014-01-05 NOTE — Assessment & Plan Note (Signed)
Assessment:  One week history of DOE, PND and orthopnea.  No lower extremity edema.  Last ECHO 03/2013 EF 0000000 and no diastolic dysfunction. Left pleural effusion vs loculated posterior lateral pericardial effusion was also noted and correlation with CXR or CT was recommended.  He has not had follow-up imaging.  His SpO2 is 94% and no crackles on exam.    Plan:  1) 2 view CXR           2) may need follow-up CT

## 2014-01-05 NOTE — Assessment & Plan Note (Signed)
BP Readings from Last 3 Encounters:  01/05/14 180/101  11/26/13 167/88  11/05/13 166/88    Lab Results  Component Value Date   NA 140 01/05/2014   K 4.4 01/05/2014   CREATININE 1.14 01/05/2014    Assessment: Blood pressure control:  uncontrolled   Progress toward BP goal:   worsening Comments: Patient missed a week of medications while staying with his son.  He reports compliance in the past week he has been back.  I believe he has been taking his medications and will need additional medication for for BP control.  Plan: Medications:  Increase verapemil to 180mg  daily and add HCTZ 12.5mg  daily, continue lisinopril 40mg  daily Educational resources provided: brochure Self management tools provided: home blood pressure logbook Other plans: CMP today to assess renal function and switch diuretic if necessary.
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
## 2014-01-05 NOTE — Telephone Encounter (Signed)
I called Joshua [name] to discuss his recent labs and imaging from his clinic visit today.  We discussed that his WBC count was elevated, likely indicating infection of his diabetic foot ulcer.  The X-ray showed gas in the soft tissue, indicating probable cellulitis, with no definite findings to suggest osteomyelitis, though this is a concern for [place].  I recommended that the patient present to the hospital for a direct admission, so that we could obtain an MRI to rule out osteomyelitis.  The patient noted that he had transportation problems, noting that a physician had told him not to drive at night due to his vision, and that he didn't have anyone else who could give him a ride.  He also cited financial concerns surrounding a hospital visit.  He noted that he had an appointment with [facility] tomorrow, and planned to attend, for further work-up and treatment of his symptoms.  Again I strongly urged the patient to present to the hospital for further evaluation.  However, if the patient is unable to do so, I believe it is reasonable to try to arrange for an MRI in the outpatient setting at his visit tomorrow, with the plan that if it cannot be done within the next 1-2 days, we could reconsider admission at his clinic visit.   SignedElnora Morrison, PGY3 01/05/2014, 8:03 PM

## 2014-01-05 NOTE — Assessment & Plan Note (Addendum)
Checking urine microalbumin:Cr ratio today.

## 2014-01-05 NOTE — Assessment & Plan Note (Addendum)
Assessment:  Wound on sole of foot is not healing and appears worse than at last visit.  He refused referral to wound care at that time.  It is not particularly tender to palpation or draining purulent fluid but it is quite deep and a little swollen.  Today I am concerned for osteomyelitis since the wound is deep (bone appears visible) and in the setting of DM.  He is hemodynamically stable so will obtain xrays and have him return to clinic tomorrow.  Xray of left foot reveals soft tissue ulceration/infection with gas in the plantar aspect of the foot beneath the great toe; no definite sign to strongly suggest osteo.    Plan:  1) CBC, ESR, xray foot completed            2)Given his complaint of chills, exam and xray findings will call patient and advise he return to the ED for admission and treatment.

## 2014-01-06 ENCOUNTER — Inpatient Hospital Stay (HOSPITAL_COMMUNITY)
Admission: AD | Admit: 2014-01-06 | Discharge: 2014-01-21 | DRG: 853 | Disposition: A | Discharge status: Skilled Nursing Facility | Payer: Self-pay | Source: Ambulatory Visit | Attending: Internal Medicine | Admitting: Internal Medicine

## 2014-01-06 ENCOUNTER — Ambulatory Visit (INDEPENDENT_AMBULATORY_CARE_PROVIDER_SITE_OTHER): Payer: PRIVATE HEALTH INSURANCE | Admitting: Internal Medicine

## 2014-01-06 ENCOUNTER — Encounter: Payer: Self-pay | Admitting: Internal Medicine

## 2014-01-06 DIAGNOSIS — R579 Shock, unspecified: Secondary | ICD-10-CM

## 2014-01-06 DIAGNOSIS — Z79899 Other long term (current) drug therapy: Secondary | ICD-10-CM

## 2014-01-06 DIAGNOSIS — K922 Gastrointestinal hemorrhage, unspecified: Secondary | ICD-10-CM | POA: Diagnosis not present

## 2014-01-06 DIAGNOSIS — R7402 Elevation of levels of lactic acid dehydrogenase (LDH): Secondary | ICD-10-CM | POA: Diagnosis not present

## 2014-01-06 DIAGNOSIS — N055 Unspecified nephritic syndrome with diffuse mesangiocapillary glomerulonephritis: Secondary | ICD-10-CM | POA: Diagnosis present

## 2014-01-06 DIAGNOSIS — I824Y9 Acute embolism and thrombosis of unspecified deep veins of unspecified proximal lower extremity: Secondary | ICD-10-CM | POA: Diagnosis present

## 2014-01-06 DIAGNOSIS — L03119 Cellulitis of unspecified part of limb: Secondary | ICD-10-CM

## 2014-01-06 DIAGNOSIS — I509 Heart failure, unspecified: Secondary | ICD-10-CM | POA: Diagnosis present

## 2014-01-06 DIAGNOSIS — J9600 Acute respiratory failure, unspecified whether with hypoxia or hypercapnia: Secondary | ICD-10-CM | POA: Diagnosis not present

## 2014-01-06 DIAGNOSIS — R0609 Other forms of dyspnea: Secondary | ICD-10-CM

## 2014-01-06 DIAGNOSIS — R0600 Dyspnea, unspecified: Secondary | ICD-10-CM

## 2014-01-06 DIAGNOSIS — R4182 Altered mental status, unspecified: Secondary | ICD-10-CM | POA: Diagnosis not present

## 2014-01-06 DIAGNOSIS — R748 Abnormal levels of other serum enzymes: Secondary | ICD-10-CM | POA: Diagnosis present

## 2014-01-06 DIAGNOSIS — I5033 Acute on chronic diastolic (congestive) heart failure: Secondary | ICD-10-CM | POA: Diagnosis not present

## 2014-01-06 DIAGNOSIS — M79606 Pain in leg, unspecified: Secondary | ICD-10-CM

## 2014-01-06 DIAGNOSIS — M25473 Effusion, unspecified ankle: Secondary | ICD-10-CM | POA: Diagnosis present

## 2014-01-06 DIAGNOSIS — J398 Other specified diseases of upper respiratory tract: Secondary | ICD-10-CM | POA: Diagnosis present

## 2014-01-06 DIAGNOSIS — N049 Nephrotic syndrome with unspecified morphologic changes: Secondary | ICD-10-CM | POA: Diagnosis present

## 2014-01-06 DIAGNOSIS — N189 Chronic kidney disease, unspecified: Secondary | ICD-10-CM | POA: Diagnosis present

## 2014-01-06 DIAGNOSIS — Z87891 Personal history of nicotine dependence: Secondary | ICD-10-CM

## 2014-01-06 DIAGNOSIS — R740 Nonspecific elevation of levels of transaminase and lactic acid dehydrogenase [LDH]: Secondary | ICD-10-CM

## 2014-01-06 DIAGNOSIS — R6520 Severe sepsis without septic shock: Secondary | ICD-10-CM

## 2014-01-06 DIAGNOSIS — R269 Unspecified abnormalities of gait and mobility: Secondary | ICD-10-CM

## 2014-01-06 DIAGNOSIS — R600 Localized edema: Secondary | ICD-10-CM

## 2014-01-06 DIAGNOSIS — E11311 Type 2 diabetes mellitus with unspecified diabetic retinopathy with macular edema: Secondary | ICD-10-CM

## 2014-01-06 DIAGNOSIS — G9340 Encephalopathy, unspecified: Secondary | ICD-10-CM | POA: Diagnosis not present

## 2014-01-06 DIAGNOSIS — R809 Proteinuria, unspecified: Secondary | ICD-10-CM | POA: Diagnosis present

## 2014-01-06 DIAGNOSIS — L02619 Cutaneous abscess of unspecified foot: Secondary | ICD-10-CM | POA: Diagnosis present

## 2014-01-06 DIAGNOSIS — N181 Chronic kidney disease, stage 1: Secondary | ICD-10-CM

## 2014-01-06 DIAGNOSIS — J90 Pleural effusion, not elsewhere classified: Secondary | ICD-10-CM | POA: Diagnosis present

## 2014-01-06 DIAGNOSIS — Z8611 Personal history of tuberculosis: Secondary | ICD-10-CM

## 2014-01-06 DIAGNOSIS — I82439 Acute embolism and thrombosis of unspecified popliteal vein: Secondary | ICD-10-CM

## 2014-01-06 DIAGNOSIS — R7401 Elevation of levels of liver transaminase levels: Secondary | ICD-10-CM | POA: Diagnosis not present

## 2014-01-06 DIAGNOSIS — Z794 Long term (current) use of insulin: Secondary | ICD-10-CM

## 2014-01-06 DIAGNOSIS — R569 Unspecified convulsions: Secondary | ICD-10-CM | POA: Diagnosis not present

## 2014-01-06 DIAGNOSIS — D6859 Other primary thrombophilia: Secondary | ICD-10-CM | POA: Diagnosis not present

## 2014-01-06 DIAGNOSIS — L02419 Cutaneous abscess of limb, unspecified: Secondary | ICD-10-CM | POA: Diagnosis present

## 2014-01-06 DIAGNOSIS — I872 Venous insufficiency (chronic) (peripheral): Secondary | ICD-10-CM

## 2014-01-06 DIAGNOSIS — Z86718 Personal history of other venous thrombosis and embolism: Secondary | ICD-10-CM | POA: Diagnosis present

## 2014-01-06 DIAGNOSIS — R601 Generalized edema: Secondary | ICD-10-CM | POA: Diagnosis present

## 2014-01-06 DIAGNOSIS — L089 Local infection of the skin and subcutaneous tissue, unspecified: Secondary | ICD-10-CM | POA: Diagnosis present

## 2014-01-06 DIAGNOSIS — I498 Other specified cardiac arrhythmias: Secondary | ICD-10-CM | POA: Diagnosis not present

## 2014-01-06 DIAGNOSIS — M8708 Idiopathic aseptic necrosis of bone, other site: Secondary | ICD-10-CM | POA: Diagnosis present

## 2014-01-06 DIAGNOSIS — E1121 Type 2 diabetes mellitus with diabetic nephropathy: Secondary | ICD-10-CM | POA: Diagnosis present

## 2014-01-06 DIAGNOSIS — E1149 Type 2 diabetes mellitus with other diabetic neurological complication: Secondary | ICD-10-CM | POA: Diagnosis present

## 2014-01-06 DIAGNOSIS — J988 Other specified respiratory disorders: Secondary | ICD-10-CM

## 2014-01-06 DIAGNOSIS — E1129 Type 2 diabetes mellitus with other diabetic kidney complication: Secondary | ICD-10-CM | POA: Diagnosis present

## 2014-01-06 DIAGNOSIS — E113599 Type 2 diabetes mellitus with proliferative diabetic retinopathy without macular edema, unspecified eye: Secondary | ICD-10-CM

## 2014-01-06 DIAGNOSIS — K811 Chronic cholecystitis: Secondary | ICD-10-CM | POA: Diagnosis present

## 2014-01-06 DIAGNOSIS — I129 Hypertensive chronic kidney disease with stage 1 through stage 4 chronic kidney disease, or unspecified chronic kidney disease: Secondary | ICD-10-CM | POA: Diagnosis present

## 2014-01-06 DIAGNOSIS — E1169 Type 2 diabetes mellitus with other specified complication: Secondary | ICD-10-CM | POA: Diagnosis present

## 2014-01-06 DIAGNOSIS — E8809 Other disorders of plasma-protein metabolism, not elsewhere classified: Secondary | ICD-10-CM | POA: Diagnosis not present

## 2014-01-06 DIAGNOSIS — N058 Unspecified nephritic syndrome with other morphologic changes: Secondary | ICD-10-CM | POA: Diagnosis present

## 2014-01-06 DIAGNOSIS — K219 Gastro-esophageal reflux disease without esophagitis: Secondary | ICD-10-CM | POA: Diagnosis present

## 2014-01-06 DIAGNOSIS — B182 Chronic viral hepatitis C: Secondary | ICD-10-CM | POA: Diagnosis present

## 2014-01-06 DIAGNOSIS — G40909 Epilepsy, unspecified, not intractable, without status epilepticus: Secondary | ICD-10-CM | POA: Diagnosis present

## 2014-01-06 DIAGNOSIS — L0390 Cellulitis, unspecified: Secondary | ICD-10-CM | POA: Diagnosis present

## 2014-01-06 DIAGNOSIS — D509 Iron deficiency anemia, unspecified: Secondary | ICD-10-CM | POA: Diagnosis present

## 2014-01-06 DIAGNOSIS — R6521 Severe sepsis with septic shock: Secondary | ICD-10-CM

## 2014-01-06 DIAGNOSIS — I5032 Chronic diastolic (congestive) heart failure: Secondary | ICD-10-CM | POA: Diagnosis present

## 2014-01-06 DIAGNOSIS — L97509 Non-pressure chronic ulcer of other part of unspecified foot with unspecified severity: Secondary | ICD-10-CM

## 2014-01-06 DIAGNOSIS — R0989 Other specified symptoms and signs involving the circulatory and respiratory systems: Secondary | ICD-10-CM

## 2014-01-06 DIAGNOSIS — N179 Acute kidney failure, unspecified: Secondary | ICD-10-CM | POA: Diagnosis present

## 2014-01-06 DIAGNOSIS — R040 Epistaxis: Secondary | ICD-10-CM | POA: Diagnosis not present

## 2014-01-06 DIAGNOSIS — G8929 Other chronic pain: Secondary | ICD-10-CM

## 2014-01-06 DIAGNOSIS — E1142 Type 2 diabetes mellitus with diabetic polyneuropathy: Secondary | ICD-10-CM | POA: Diagnosis present

## 2014-01-06 DIAGNOSIS — L97529 Non-pressure chronic ulcer of other part of left foot with unspecified severity: Secondary | ICD-10-CM

## 2014-01-06 DIAGNOSIS — I10 Essential (primary) hypertension: Secondary | ICD-10-CM | POA: Diagnosis present

## 2014-01-06 DIAGNOSIS — K810 Acute cholecystitis: Secondary | ICD-10-CM | POA: Diagnosis present

## 2014-01-06 DIAGNOSIS — D62 Acute posthemorrhagic anemia: Secondary | ICD-10-CM | POA: Diagnosis not present

## 2014-01-06 DIAGNOSIS — S91119A Laceration without foreign body of unspecified toe without damage to nail, initial encounter: Secondary | ICD-10-CM

## 2014-01-06 DIAGNOSIS — M25476 Effusion, unspecified foot: Secondary | ICD-10-CM | POA: Diagnosis present

## 2014-01-06 DIAGNOSIS — A419 Sepsis, unspecified organism: Principal | ICD-10-CM | POA: Diagnosis present

## 2014-01-06 LAB — MICROALBUMIN / CREATININE URINE RATIO
CREATININE, URINE: 49.4 mg/dL
MICROALB UR: 416.65 mg/dL — AB (ref 0.00–1.89)
Microalb Creat Ratio: 8434.2 mg/g — ABNORMAL HIGH (ref 0.0–30.0)

## 2014-01-06 NOTE — Progress Notes (Signed)
Patient arrived via wheelchair at 2250 with daughter.  Direct admit.  Pt alert and oriented x4.  No complaints of pain or other symptoms.  Vital signs taken.  Patient oriented to staff, room, and call bell.  Bed in lowest position.  Call bell within reach.  Pt educated on use of call bell; verbalized understanding.  Awaiting orders from Internal Medicine.  Will continue to monitor.

## 2014-01-06 NOTE — Progress Notes (Signed)
INTERNAL MEDICINE TEACHING ATTENDING ADDENDUM - Dominic Pea, DO: I reviewed with the resident Dr. Blaine Hamper, at the time of visit,  the medical history, physical examination, diagnosis and results of tests and treatment and I agree with the patient's care as documented.

## 2014-01-06 NOTE — Patient Instructions (Signed)
1. Your have serious infection in the foot. If your develop fever or chills, or any other symptoms arise, please call the clinic [phone]), or go to the ER immediately if symptoms are severe. 2. .Please come back tomorrow morning.

## 2014-01-06 NOTE — Progress Notes (Signed)
INTERNAL MEDICINE TEACHING ATTENDING ADDENDUM - Dominic Pea, DO: I reviewed with the resident Dr. Redmond Pulling, at the time of visit,  the medical history, physical examination, diagnosis and results of tests and treatment and I agree with the patient's care as documented.

## 2014-01-06 NOTE — Assessment & Plan Note (Signed)
No significant change since yesterday.  Last ECHO 03/2013 EF 0000000 and no diastolic dysfunction. Left pleural effusion vs loculated posterior lateral pericardial effusion was also noted. His O2 Sat is normal in clinic today,  and no crackles on exam.   -may need CT-chest after admission tomorrow.

## 2014-01-06 NOTE — Progress Notes (Signed)
Patient ID: Joshua [name], male   DOB: [birth year], 63 y.o.   MRN: [id number] Subjective:   Patient ID: Joshua [name] male   DOB: [birth year] 63 y.o.   MRN: [id number]  CC:   Follow up visit.  HPI:  Joshua [name] is a 63 y.o. man with past medical history as outlined below, who presents for a followup visit today  Patient was seen in clinic yesterday for two major issues, including puncture wound of left great toe and dyspnea.   1. Foot wound: He had X-ray showed gas in the soft tissue, indicating probable cellulitis without definite findings to suggest osteomyelitis. He also had tachycardia and leukocytosis, indicating SIRS. He was advised to be admitted to hospital to obtain an MRI for ruling out osteomyelitis, but he refused and went home from clinic. The night on-call resident, dr. Owens Shark called patient to have asked him again to come the hospital for direct admission, and he again refused.  Today he comes back to clinic, asking for treatment. His health condition has no significant change. He reports feeling hot, but not fever. He has chills. Still has similar pain in his foot over the ulcerative area of great toe. He is hemodynamically stable, but has tachycardia. Body temperature is normal.  X-ray of left foot on 01/05/14: Probable soft tissue ulceration/infection with gas in the plantar aspect of the foot beneath the great toe (adjacent to the distal first metatarsal). No definite signs to strongly suggest frank osteomyelitis.   2. Dyspnea: No significant change since the yesterday. He has one week history of DOE, PND and orthopnea. CXR on 01/05/14 showed chronic left basilar densities, which is suggestive for pleural-based disease. Findings could be related to a loculated effusion or pleural thickening per radiologist. Plan was to admit patient into hospital and get CT chest for further evaluation and treatment. Again patient refused admission yesterday.   ROS:  Has chill and SOB. Denies  fever, chest pain, abdominal pain, diarrhea, constipation, dysuria, urgency, frequency, hematuria.  Past Medical History  Diagnosis Date  . 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 Hypertension 10/14/2012  . Type 2 diabetes mellitus   . Diabetic nephropathy with proteinuria   . 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 gabapentin (NEURONTIN) 300 MG capsule Begin taking 1 tablet a day for 3 days, and then 1 tablet 2 times a day for 3 days, and then 1 tablet 3 times a day  90 capsule  2  . glipiZIDE (GLUCOTROL) 5 MG tablet Take 1 tablet (5 mg total) by mouth 2 (two) times daily before a meal.  30 tablet  11  . glucose monitoring kit (FREESTYLE) monitoring kit 1 each by Does not apply route as needed for other.  1 each    . hydrochlorothiazide (MICROZIDE) 12.5 MG capsule Take 1 capsule (12.5 mg total) by mouth daily.  30 capsule  1  . lisinopril (PRINIVIL,ZESTRIL) 40 MG tablet Take 1 tablet (40 mg total) by mouth daily.  30 tablet  2  . metFORMIN (GLUCOPHAGE) 500 MG tablet Take 2 tablets (1,000 mg total) by mouth 2 (two) times daily with a meal.  60 tablet  2  . verapamil (CALAN-SR) 180 MG CR tablet Take 1 tablet (180 mg total) by mouth daily.  30 tablet  2   No current facility-administered medications for this visit.   No family history on file. History   Social History  .  Marital Status: Single    Spouse Name: N/A    Number of Children: 5  . 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 Smoking status: Former Smoker -- 20 years    Types: Cigarettes    Quit date: 11/25/1992  . Smokeless tobacco: Never Used  . Alcohol Use: No     Comment: Rarely.  . Drug Use: No  . Sexual Activity: Not on file     Comment: 09/2012: > 15 partners in past 3 years, inconsistent condom use   Other Topics Concern  . Not on file   Social History Narrative    Has a masters degree in education    Review of Systems: Full 14-point review of systems otherwise negative. See HPI.   Objective:  Physical Exam: There were no vitals filed for this visit. Constitutional: Vital signs reviewed.  Patient is a well-developed and well-nourished, in no acute distress and cooperative with exam.   HEENT:  Head: Normocephalic and atraumatic Mouth: no erythema or exudates, MMM Eyes: PERRL, EOMI, conjunctivae normal, No scleral icterus.  Neck: Supple, Trachea midline normal ROM, No JVD  Cardiovascular: RRR, S1 normal, S2 normal, no MRG, pulses symmetric and intact bilaterally Pulmonary/Chest: CTAB, no wheezes, rales, or rhonchi Abdominal: Soft. Non-tender, non-distended, bowel sounds are normal, no masses, organomegaly, or guarding present.  GU: no CVA tenderness Musculoskeletal: No joint deformities, erythema, or stiffness, ROM full and non-tender Extremities: Healed scars on B/L legs.  Deep puncture wound on left sole of foot with evidence of drainage though no purulent drainage appreciated, slightly odorous, non-tender to palpation.  Hematology: no cervical, inginal, or axillary adenopathy.  Neurological: A&O x3, Strength is normal and symmetric bilaterally, cranial nerve II-XII are grossly intact, no focal motor deficit, sensory intact to light touch bilaterally.  Skin: Warm, and intact. No rash, cyanosis, or clubbing.  Psychiatric: Normal mood and affect. No suicidal or homicidal ideation.  Assessment & Plan:

## 2014-01-06 NOTE — Assessment & Plan Note (Signed)
The X-ray showed gas in the soft tissue, indicating probable cellulitis. Currently we can not rule out osteomyelitis. Patient has leukocytosis and tachycardia, indicating SIRS. Given his history of diabetes, patient is at high risk of developing severe sepsis. Our plan is to admit patient into hospital and get MRI to rule out osteomyelitis, and start IV antibiotic treatment today. He refused to stay in hospital. Both Dr. Murlean Caller and I myself spent lengthy time to have explained our concerns and plan in very detail, he still refused to stay in the hospital. He states that he has to go home today to pay his home rent, otherwise he will be kicked out of his home. He understands that he will face very high risk if he does not stay in hospital to start treatment today, including possible death if his infection get worse. He finally decided to go home today and come back tomorrow for admission.   -will wait for him to come back for direct admission tomorrow -Patient was instructed to come back to the hospital, or emergency room immediately if his symptoms get worse. He agreed to do so.

## 2014-01-07 ENCOUNTER — Observation Stay (HOSPITAL_COMMUNITY): Payer: Self-pay

## 2014-01-07 ENCOUNTER — Encounter (HOSPITAL_COMMUNITY): Payer: Self-pay | Admitting: *Deleted

## 2014-01-07 DIAGNOSIS — N189 Chronic kidney disease, unspecified: Secondary | ICD-10-CM

## 2014-01-07 DIAGNOSIS — N058 Unspecified nephritic syndrome with other morphologic changes: Secondary | ICD-10-CM

## 2014-01-07 DIAGNOSIS — L89609 Pressure ulcer of unspecified heel, unspecified stage: Secondary | ICD-10-CM

## 2014-01-07 DIAGNOSIS — R579 Shock, unspecified: Secondary | ICD-10-CM

## 2014-01-07 DIAGNOSIS — I120 Hypertensive chronic kidney disease with stage 5 chronic kidney disease or end stage renal disease: Secondary | ICD-10-CM

## 2014-01-07 DIAGNOSIS — J9600 Acute respiratory failure, unspecified whether with hypoxia or hypercapnia: Secondary | ICD-10-CM | POA: Diagnosis present

## 2014-01-07 DIAGNOSIS — M7989 Other specified soft tissue disorders: Secondary | ICD-10-CM

## 2014-01-07 DIAGNOSIS — R569 Unspecified convulsions: Secondary | ICD-10-CM | POA: Diagnosis not present

## 2014-01-07 DIAGNOSIS — L8994 Pressure ulcer of unspecified site, stage 4: Secondary | ICD-10-CM

## 2014-01-07 DIAGNOSIS — L0390 Cellulitis, unspecified: Secondary | ICD-10-CM | POA: Diagnosis present

## 2014-01-07 DIAGNOSIS — N179 Acute kidney failure, unspecified: Secondary | ICD-10-CM | POA: Diagnosis not present

## 2014-01-07 DIAGNOSIS — G9340 Encephalopathy, unspecified: Secondary | ICD-10-CM

## 2014-01-07 DIAGNOSIS — R0602 Shortness of breath: Secondary | ICD-10-CM

## 2014-01-07 DIAGNOSIS — I96 Gangrene, not elsewhere classified: Secondary | ICD-10-CM

## 2014-01-07 LAB — CBC
HCT: 24.6 % — ABNORMAL LOW (ref 39.0–52.0)
HEMATOCRIT: 25.2 % — AB (ref 39.0–52.0)
Hemoglobin: 8.2 g/dL — ABNORMAL LOW (ref 13.0–17.0)
Hemoglobin: 8.2 g/dL — ABNORMAL LOW (ref 13.0–17.0)
MCH: 24.5 pg — AB (ref 26.0–34.0)
MCH: 24.2 pg — AB (ref 26.0–34.0)
MCHC: 33.3 g/dL (ref 30.0–36.0)
MCHC: 32.5 g/dL (ref 30.0–36.0)
MCV: 73.4 fL — ABNORMAL LOW (ref 78.0–100.0)
MCV: 74.3 fL — AB (ref 78.0–100.0)
PLATELETS: 329 10*3/uL (ref 150–400)
Platelets: 352 10*3/uL (ref 150–400)
RBC: 3.35 MIL/uL — ABNORMAL LOW (ref 4.22–5.81)
RBC: 3.39 MIL/uL — ABNORMAL LOW (ref 4.22–5.81)
RDW: 17.7 % — AB (ref 11.5–15.5)
RDW: 17.9 % — ABNORMAL HIGH (ref 11.5–15.5)
WBC: 13.4 10*3/uL — ABNORMAL HIGH (ref 4.0–10.5)
WBC: 10.8 10*3/uL — AB (ref 4.0–10.5)

## 2014-01-07 LAB — LACTIC ACID, PLASMA: Lactic Acid, Venous: 2 mmol/L (ref 0.5–2.2)

## 2014-01-07 LAB — BLOOD GAS, ARTERIAL
ACID-BASE DEFICIT: 3.6 mmol/L — AB (ref 0.0–2.0)
Acid-base deficit: 6.5 mmol/L — ABNORMAL HIGH (ref 0.0–2.0)
BICARBONATE: 20.2 meq/L (ref 20.0–24.0)
Bicarbonate: 18.7 mEq/L — ABNORMAL LOW (ref 20.0–24.0)
Drawn by: 225631
Drawn by: 36496
FIO2: 1 %
FIO2: 1 %
MECHVT: 500 mL
O2 Content: 15 L/min
O2 SAT: 99.7 %
O2 Saturation: 98.2 %
PATIENT TEMPERATURE: 98.6
PEEP: 5 cmH2O
Patient temperature: 98.6
RATE: 16 resp/min
TCO2: 21.2 mmol/L (ref 0–100)
TCO2: 19.9 mmol/L (ref 0–100)
pCO2 arterial: 31.9 mmHg — ABNORMAL LOW (ref 35.0–45.0)
pCO2 arterial: 38.9 mmHg (ref 35.0–45.0)
pH, Arterial: 7.417 (ref 7.350–7.450)
pH, Arterial: 7.302 — ABNORMAL LOW (ref 7.350–7.450)
pO2, Arterial: 247 mmHg — ABNORMAL HIGH (ref 80.0–100.0)
pO2, Arterial: 298 mmHg — ABNORMAL HIGH (ref 80.0–100.0)

## 2014-01-07 LAB — URINE MICROSCOPIC-ADD ON

## 2014-01-07 LAB — COMPREHENSIVE METABOLIC PANEL
ALT: 32 U/L (ref 0–53)
AST: 43 U/L — AB (ref 0–37)
Albumin: 1.3 g/dL — ABNORMAL LOW (ref 3.5–5.2)
Alkaline Phosphatase: 177 U/L — ABNORMAL HIGH (ref 39–117)
BILIRUBIN TOTAL: 0.4 mg/dL (ref 0.3–1.2)
BUN: 42 mg/dL — AB (ref 6–23)
CALCIUM: 7.4 mg/dL — AB (ref 8.4–10.5)
CHLORIDE: 100 meq/L (ref 96–112)
CO2: 18 mEq/L — ABNORMAL LOW (ref 19–32)
CREATININE: 1.81 mg/dL — AB (ref 0.50–1.35)
GFR calc Af Amer: 45 mL/min — ABNORMAL LOW (ref >90)
GFR, EST NON AFRICAN AMERICAN: 38 mL/min — AB (ref >90)
Glucose, Bld: 191 mg/dL — ABNORMAL HIGH (ref 70–99)
Potassium: 4.4 mEq/L (ref 3.7–5.3)
Sodium: 133 mEq/L — ABNORMAL LOW (ref 137–147)
Total Protein: 5.9 g/dL — ABNORMAL LOW (ref 6.0–8.3)

## 2014-01-07 LAB — BASIC METABOLIC PANEL
BUN: 39 mg/dL — AB (ref 6–23)
CO2: 22 mEq/L (ref 19–32)
CREATININE: 1.75 mg/dL — AB (ref 0.50–1.35)
Calcium: 7.9 mg/dL — ABNORMAL LOW (ref 8.4–10.5)
Chloride: 102 mEq/L (ref 96–112)
GFR calc Af Amer: 46 mL/min — ABNORMAL LOW (ref >90)
GFR, EST NON AFRICAN AMERICAN: 40 mL/min — AB (ref >90)
GLUCOSE: 138 mg/dL — AB (ref 70–99)
Potassium: 3.9 mEq/L (ref 3.7–5.3)
Sodium: 138 mEq/L (ref 137–147)

## 2014-01-07 LAB — GLUCOSE, CAPILLARY
GLUCOSE-CAPILLARY: 134 mg/dL — AB (ref 70–99)
Glucose-Capillary: 131 mg/dL — ABNORMAL HIGH (ref 70–99)
Glucose-Capillary: 136 mg/dL — ABNORMAL HIGH (ref 70–99)
Glucose-Capillary: 235 mg/dL — ABNORMAL HIGH (ref 70–99)
Glucose-Capillary: 185 mg/dL — ABNORMAL HIGH (ref 70–99)
Glucose-Capillary: 260 mg/dL — ABNORMAL HIGH (ref 70–99)

## 2014-01-07 LAB — HIV ANTIBODY (ROUTINE TESTING W REFLEX): HIV: NONREACTIVE

## 2014-01-07 LAB — URINALYSIS, ROUTINE W REFLEX MICROSCOPIC
Glucose, UA: 250 mg/dL — AB
KETONES UR: 15 mg/dL — AB
Leukocytes, UA: NEGATIVE
NITRITE: NEGATIVE
PH: 5 (ref 5.0–8.0)
Protein, ur: >300 mg/dL — AB
Specific Gravity, Urine: 1.029 (ref 1.005–1.030)
Urobilinogen, UA: 1 mg/dL (ref 0.0–1.0)

## 2014-01-07 LAB — TROPONIN I

## 2014-01-07 LAB — PROCALCITONIN: Procalcitonin: 1.04 ng/mL

## 2014-01-07 MED ORDER — INSULIN ASPART 100 UNIT/ML ~~LOC~~ SOLN: 0.0000 [IU] | Freq: Every day | SUBCUTANEOUS | Status: DC

## 2014-01-07 MED ORDER — ETOMIDATE 2 MG/ML IV SOLN
20.0000 mg | Freq: Once | INTRAVENOUS | Status: AC
  Administered 2014-01-07: 20 mg via INTRAVENOUS

## 2014-01-07 MED ORDER — NITROGLYCERIN 0.4 MG SL SUBL: 0.4000 mg | SUBLINGUAL_TABLET | SUBLINGUAL | Status: DC | PRN

## 2014-01-07 MED ORDER — SODIUM CHLORIDE 0.9 % IV BOLUS (SEPSIS)
1000.0000 mL | Freq: Once | INTRAVENOUS | Status: AC
  Administered 2014-01-07: 1000 mL via INTRAVENOUS

## 2014-01-07 MED ORDER — LINEZOLID 2 MG/ML IV SOLN
600.0000 mg | Freq: Two times a day (BID) | INTRAVENOUS | Status: DC
  Administered 2014-01-07 – 2014-01-10 (×7): 600 mg via INTRAVENOUS
  Filled 2014-01-07 (×9): qty 300

## 2014-01-07 MED ORDER — HEPARIN (PORCINE) IN NACL 100-0.45 UNIT/ML-% IJ SOLN
1200.0000 [IU]/h | INTRAMUSCULAR | Status: DC
  Administered 2014-01-07: 1100 [IU]/h via INTRAVENOUS
  Administered 2014-01-08: 1200 [IU]/h via INTRAVENOUS
  Filled 2014-01-07 (×2): qty 250

## 2014-01-07 MED ORDER — INSULIN ASPART 100 UNIT/ML ~~LOC~~ SOLN
0.0000 [IU] | Freq: Three times a day (TID) | SUBCUTANEOUS | Status: DC
  Administered 2014-01-07 (×2): 1 [IU] via SUBCUTANEOUS

## 2014-01-07 MED ORDER — INSULIN ASPART 100 UNIT/ML ~~LOC~~ SOLN
0.0000 [IU] | SUBCUTANEOUS | Status: DC
  Administered 2014-01-07: 5 [IU] via SUBCUTANEOUS
  Administered 2014-01-08 (×2): 1 [IU] via SUBCUTANEOUS
  Administered 2014-01-08: 3 [IU] via SUBCUTANEOUS
  Administered 2014-01-09: 1 [IU] via SUBCUTANEOUS
  Administered 2014-01-09 (×2): 2 [IU] via SUBCUTANEOUS
  Administered 2014-01-10: 1 [IU] via SUBCUTANEOUS

## 2014-01-07 MED ORDER — SIMETHICONE 80 MG PO CHEW
40.0000 mg | CHEWABLE_TABLET | Freq: Four times a day (QID) | ORAL | Status: DC | PRN
  Filled 2014-01-07: qty 1

## 2014-01-07 MED ORDER — HYDROCHLOROTHIAZIDE 12.5 MG PO CAPS
25.0000 mg | ORAL_CAPSULE | Freq: Every day | ORAL | Status: DC
  Administered 2014-01-07: 25 mg via ORAL
  Filled 2014-01-07: qty 2

## 2014-01-07 MED ORDER — HEPARIN BOLUS VIA INFUSION
4000.0000 [IU] | INTRAVENOUS | Status: AC
  Administered 2014-01-07: 4000 [IU] via INTRAVENOUS
  Filled 2014-01-07: qty 4000

## 2014-01-07 MED ORDER — PANTOPRAZOLE SODIUM 40 MG PO PACK
40.0000 mg | PACK | Freq: Every day | ORAL | Status: DC
  Administered 2014-01-08: 40 mg
  Filled 2014-01-07 (×2): qty 20

## 2014-01-07 MED ORDER — DOPAMINE-DEXTROSE 3.2-5 MG/ML-% IV SOLN
2.0000 ug/kg/min | INTRAVENOUS | Status: DC
  Administered 2014-01-07: 5 ug/kg/min via INTRAVENOUS

## 2014-01-07 MED ORDER — DOPAMINE-DEXTROSE 3.2-5 MG/ML-% IV SOLN
INTRAVENOUS | Status: AC
  Administered 2014-01-07: 5 ug/kg/min via INTRAVENOUS
  Filled 2014-01-07: qty 250

## 2014-01-07 MED ORDER — FENTANYL CITRATE 0.05 MG/ML IJ SOLN
INTRAMUSCULAR | Status: AC
  Filled 2014-01-07: qty 4

## 2014-01-07 MED ORDER — ACETAMINOPHEN 325 MG PO TABS
650.0000 mg | ORAL_TABLET | Freq: Once | ORAL | Status: AC
  Administered 2014-01-07: 650 mg via ORAL
  Filled 2014-01-07: qty 2

## 2014-01-07 MED ORDER — ASPIRIN 325 MG PO TABS
325.0000 mg | ORAL_TABLET | Freq: Every day | ORAL | Status: DC
  Administered 2014-01-08: 325 mg
  Filled 2014-01-07 (×2): qty 1

## 2014-01-07 MED ORDER — HYDRALAZINE HCL 20 MG/ML IJ SOLN: 10.0000 mg | INTRAMUSCULAR | Status: DC | PRN

## 2014-01-07 MED ORDER — SIMETHICONE 40 MG/0.6ML PO SUSP: 40.0000 mg | Freq: Four times a day (QID) | ORAL | Status: DC | PRN

## 2014-01-07 MED ORDER — LISINOPRIL 40 MG PO TABS
40.0000 mg | ORAL_TABLET | Freq: Every day | ORAL | Status: DC
  Administered 2014-01-07: 40 mg via ORAL
  Filled 2014-01-07: qty 1

## 2014-01-07 MED ORDER — LORAZEPAM 2 MG/ML IJ SOLN
0.5000 mg | INTRAMUSCULAR | Status: DC | PRN
  Administered 2014-01-07: 1 mg via INTRAVENOUS
  Filled 2014-01-07: qty 1

## 2014-01-07 MED ORDER — VERAPAMIL HCL ER 180 MG PO TBCR: 180.0000 mg | EXTENDED_RELEASE_TABLET | Freq: Every day | ORAL | Status: DC

## 2014-01-07 MED ORDER — HYDROCHLOROTHIAZIDE 12.5 MG PO CAPS: 12.5000 mg | ORAL_CAPSULE | Freq: Every day | ORAL | Status: DC

## 2014-01-07 MED ORDER — GABAPENTIN 300 MG PO CAPS
300.0000 mg | ORAL_CAPSULE | Freq: Three times a day (TID) | ORAL | Status: DC
  Administered 2014-01-07: 300 mg via ORAL
  Filled 2014-01-07 (×3): qty 1

## 2014-01-07 MED ORDER — LABETALOL HCL 5 MG/ML IV SOLN
10.0000 mg | Freq: Once | INTRAVENOUS | Status: AC
  Administered 2014-01-07: 10 mg via INTRAVENOUS
  Filled 2014-01-07: qty 4

## 2014-01-07 MED ORDER — PIPERACILLIN-TAZOBACTAM 3.375 G IVPB
3.3750 g | INTRAVENOUS | Status: AC
  Administered 2014-01-07: 3.375 g via INTRAVENOUS
  Filled 2014-01-07: qty 50

## 2014-01-07 MED ORDER — VANCOMYCIN HCL IN DEXTROSE 1-5 GM/200ML-% IV SOLN
1000.0000 mg | INTRAVENOUS | Status: DC
  Administered 2014-01-07: 1000 mg via INTRAVENOUS
  Filled 2014-01-07: qty 200

## 2014-01-07 MED ORDER — SODIUM CHLORIDE 0.9 % IV SOLN
INTRAVENOUS | Status: AC
  Administered 2014-01-07: 02:00:00 via INTRAVENOUS
  Administered 2014-01-10: 20 mL/h via INTRAVENOUS

## 2014-01-07 MED ORDER — LORAZEPAM 2 MG/ML IJ SOLN
1.0000 mg | Freq: Once | INTRAMUSCULAR | Status: AC
  Administered 2014-01-07: 1 mg via INTRAVENOUS

## 2014-01-07 MED ORDER — ASPIRIN 325 MG PO TABS
325.0000 mg | ORAL_TABLET | Freq: Every day | ORAL | Status: DC
  Filled 2014-01-07: qty 1

## 2014-01-07 MED ORDER — PANTOPRAZOLE SODIUM 40 MG PO TBEC
40.0000 mg | DELAYED_RELEASE_TABLET | Freq: Every day | ORAL | Status: DC
  Administered 2014-01-07: 40 mg via ORAL
  Filled 2014-01-07: qty 1

## 2014-01-07 MED ORDER — SODIUM CHLORIDE 0.9 % IV SOLN
1000.0000 mg | Freq: Once | INTRAVENOUS | Status: AC
  Administered 2014-01-07: 1000 mg via INTRAVENOUS
  Filled 2014-01-07: qty 10

## 2014-01-07 MED ORDER — FENTANYL CITRATE 0.05 MG/ML IJ SOLN
25.0000 ug | INTRAMUSCULAR | Status: DC | PRN
  Administered 2014-01-07 – 2014-01-08 (×6): 100 ug via INTRAVENOUS
  Filled 2014-01-07 (×6): qty 2

## 2014-01-07 MED ORDER — ROCURONIUM BROMIDE 50 MG/5ML IV SOLN
50.0000 mg | Freq: Once | INTRAVENOUS | Status: AC
  Administered 2014-01-07: 50 mg via INTRAVENOUS

## 2014-01-07 MED ORDER — HEPARIN SODIUM (PORCINE) 5000 UNIT/ML IJ SOLN
5000.0000 [IU] | Freq: Three times a day (TID) | INTRAMUSCULAR | Status: DC
  Administered 2014-01-07 (×2): 5000 [IU] via SUBCUTANEOUS
  Filled 2014-01-07 (×4): qty 1

## 2014-01-07 MED ORDER — SODIUM CHLORIDE 0.9 % IV SOLN
500.0000 mg | Freq: Two times a day (BID) | INTRAVENOUS | Status: DC
  Administered 2014-01-08 – 2014-01-13 (×10): 500 mg via INTRAVENOUS
  Filled 2014-01-07 (×15): qty 5

## 2014-01-07 MED ORDER — LORAZEPAM 2 MG/ML IJ SOLN
INTRAMUSCULAR | Status: AC
  Filled 2014-01-07: qty 1

## 2014-01-07 MED ORDER — VERAPAMIL HCL ER 240 MG PO TBCR
240.0000 mg | EXTENDED_RELEASE_TABLET | Freq: Every day | ORAL | Status: DC
  Administered 2014-01-07: 240 mg via ORAL
  Filled 2014-01-07: qty 1

## 2014-01-07 NOTE — Progress Notes (Addendum)
Patient's temp is 102.3 F orally.  Pt's skin hot to touch and sweating.  MD notified and acknowledged; stated she will order Tylenol one-time dose.  Will continue to assess.

## 2014-01-07 NOTE — Progress Notes (Addendum)
INTERNAL MEDICINE TEACHING SERVICE Internal progress note   Subjective:    We were called by the RN for evaluation of change of clinical status with right upper quadrant abdominal pain, and labored breathing. At time of evaluation, pt was complaining of right upper quadrant pain, which started immediately after having lunch. He reports that he frequently gets similar shortness of breath and abdominal pain, and he feels like he can't digest his food. He also complains that he can't "burp".   Earlier and the day, patient had received verapamil at an increased dose of 240 mg daily, and labetalol in addition, hydralazine IV for the treatment of elevated blood pressure.  At the time of our evaluation, patient was appearing to be in significant discomfort, and blood pressure was found to be low 90s over 50s and oxygen saturation was 84% on room air and a nonrebreather oxygen supplementation was promptly initiated. Oxygen saturation recovered to 100%.   Rapid response team was called and the patient was started on IV fluids. After 750 cc of intravenous normal saline, patient's MAP was 62 mmHg.   EKG, and did not reveal acute changes.    Objective:    BP 67/45  Pulse 59  Temp(Src) 98.8 F (37.1 C) (Oral)  Resp 18  Ht 5\' 5"  (1.651 m)  Wt 150 lb 12.7 oz (68.4 kg)  BMI 25.09 kg/m2  SpO2 94%   Labs: Basic Metabolic Panel:    Component Value Date/Time   NA 138 01/07/2014 0606   K 3.9 01/07/2014 0606   CL 102 01/07/2014 0606   CO2 22 01/07/2014 0606   BUN 39* 01/07/2014 0606   CREATININE 1.75* 01/07/2014 0606   CREATININE 1.14 01/05/2014 1616   GLUCOSE 138* 01/07/2014 0606   CALCIUM 7.9* 01/07/2014 0606    CBC:    Component Value Date/Time   WBC 10.8* 01/07/2014 1511   HGB 8.2* 01/07/2014 1511   HCT 25.2* 01/07/2014 1511   PLT 329 01/07/2014 1511   MCV 74.3* 01/07/2014 1511   NEUTROABS 11.1* 01/05/2014 1616   LYMPHSABS 1.5 01/05/2014 1616   MONOABS 1.5* 01/05/2014 1616   EOSABS 0.1 01/05/2014  1616   BASOSABS 0.0 01/05/2014 1616    Cardiac Enzymes: No results found for this basename: CKTOTAL,  CKMB,  CKMBINDEX,  TROPONINI   Filed Vitals:   01/07/14 1707  BP: 80/50  Pulse: 56  Temp:   Resp: 24    Physical Exam: General: Vital signs reviewed and noted; labored breathing, alert, appropriate and cooperative throughout examination.  Lungs:  Clear to auscultation BL without crackles or wheezes.  Heart: RRR. S1 and S2 normal without gallop, murmur, or rubs.  Abdomen:  Right upper quadrant pain without guarding or rebound tenderness. Bowel sounds are decreased  Extremities: No pretibial edema. left foot wound unchanged.      Assessment/ Plan:   # Acute hypoxic respiratory failure:  Most likely due to sepsis focus of infection left foot wound. Other possibilities including PE less likely however, the patient is to be evaluated with previously ordered dopplers. Patient was aggressively resuscitated with IV fluids, but his blood pressures did not respond. His abdominal pain, raises a concern of mesenteric ischemia since it started after a meal. Stat abdominal x-ray without evidence of free air under the diaphragm. Portable chest x-ray did not reveal any acute changes.  Plan. - Oxygenation improved with nonrebreather - Continue with aggressive IV fluids. -Check BMP, CBC, troponin, lactic acid level, procaltonin,  - Arterial blood gases -  pH 7.41. pCO2 of 32. - EKG without changes  - discussed with Dr Kieth Brightly who agreed to transfer the patient to ICU as patient will likely require pressors - started vanc and Zosyn stat  _ attempted to contact his family but unsuccessfully  - a abdominal CT for infection focus might be required once he is stable - Critical care team will assume care today.   Jessee Avers, MD  01/07/2014, 5:03 PM

## 2014-01-07 NOTE — Procedures (Signed)
Oral Intubation Procedure Note   Procedure: Intubation Indications: AMS, post ictal Consent: Unable to obtain consent because of altered level of consciousness. Time Out: Verified patient identification, verified procedure, site/side was marked, verified correct patient position, special equipment/implants available, medications/allergies/relevent history reviewed, required imaging and test results available.   Pre-meds: Etomidate 20 mg IV  Neuromuscular blockade: Rocuronium 50 mg IV  Laryngoscope: #4 MAC  Visualization: cords partially visualized - only posterior 1/3 of arytenoids visualized  ETT: 8.0 ETT passed on first attempt and secured @ 23 cm at incisors  Findings:  Position confirmed with EZ-CAP device and auscultation  Evaluation:  CXR pending Pt tolerated procedure well without complications   Merton Border, MD ; [facility] service Mobile [phone].  After 5:30 PM or weekends, call [phone]

## 2014-01-07 NOTE — H&P (Signed)
  Date: 01/07/2014  Patient name: Joshua [name]  Medical record number: [id number]  Date of birth: [birth year]   I have seen and evaluated Joshua [name] and discussed their care with the Residency Team.   Assessment and Plan: I have seen and evaluated the patient as outlined above. I agree with the formulated Assessment and Plan as detailed in the residents' admission note, with the following changes:   63 year old with DM2, previous treatment (abbreviated) for extrapulmonary TB per [name]'s notes at [facility] admitted with worseing foot ulcer with increasing pain, chills, LH. He had plain film of feet showing gas in area of open ulcer. He was initially hypertensive and was given higher than his typical dose of verapamil along with higher dose of HCTZ, contnued ACEU as well as labetalol  This am he was with foot ulcer but otherwise seemed comfortable:         His left leg was markedly more edematous thant he right one. He did not have visible purulence from the wound this am.  Initially our plan was to withold antibiotics for his foot ulcer until we could get MRI given his hemodynamic stability and no evidence for fulminant infection. Gas in the toe was in area of ulceration.   We also had ordered dopplers to exclude DVT given newly asymmetric LE  Since then he has deteriorated remarkably.  After lunch he vomited up cole slaw and had severe RUQ pain. He has had progressive RUQ pain with abdominal distention and is lying on his right side for comfort. Stat portable films do not show free air under the diaphragm. He had drop his his BP into the 70s and now even 60s post >1 nearly 1.5 L bolus  We have ordered zosyn and vancomycin and he is just receiving zosyn as I type this note and he has been transferred to the ICU.  This is a very puzzling picture, my R2 tells me pt has been complaining of post prandial abd pain for some tmie but NOT to the extent he has now clearly. He did NOT  present with this RUQ pain.  Perhaps overly aggressive BP control may have precipitated mesenteric ischemia and perhaps we are dealing with a perforation in the abdomen without obviious air on kub.  I suppose his foot infection could be more fulminant than we had initially suspected and with gas in tissues. Concern for severe infection now looms. Still his exam at the bedside of foot was NOT at all suggestive for necrotizing fascitis  With his hypoxia that is new, I do also worry about PE from DVT  CCM is assuming care for the patient.   I will add zyvox IF for now in place of the vancomycin for better inhibition of toxins if pt has a nec fascitis picture--I dont think he does but just in case. It will also penetrate tissue rapidly  I spent greater than 60 minutes with the patient including greater than 50% of time in face to face counsel of the patient and in coordination of their care.     Truman Hayward, [place] 2/13/20154:53 PM

## 2014-01-07 NOTE — Consult Note (Signed)
Reason for Consult: diabetic foot Referring Physician: medicine service   Joshua [name] is an 63 y.o. male.  HPI:  We were called to do a consult on this patient with left diabetic foot.  Patient was just transferred to the ICU due to acute abd pain, hypotension and seizure.  Getting central line placed at the time of our arrival.    Past Medical History  Diagnosis Date  . 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 Hypertension 10/14/2012  . Type 2 diabetes mellitus   . Diabetic nephropathy with proteinuria   . 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 Knee arthroscopy  2010    3 times, right knee    No family history on file.  Social History:  reports that he quit smoking about 21 years ago. His smoking use included Cigarettes. He smoked 0.00 packs per day for 20 years. He has never used smokeless tobacco. He reports that he does not drink alcohol or use illicit drugs.  Allergies: No Known Allergies  Medications: I have reviewed the patient's current medications.  Results for orders placed during the hospital encounter of 01/06/14 (from the past 48 hour(s))  GLUCOSE, CAPILLARY     Status: Abnormal   Collection Time    01/07/14  1:40 AM      Result Value Ref Range   Glucose-Capillary 134 (*) 70 - 99 mg/dL  BASIC METABOLIC PANEL     Status: Abnormal   Collection Time    01/07/14  6:06 AM      Result Value Ref Range   Sodium 138  137 - 147 mEq/L   Potassium 3.9  3.7 - 5.3 mEq/L   Chloride 102  96 - 112 mEq/L   CO2 22  19 - 32 mEq/L   Glucose, Bld 138 (*) 70 - 99 mg/dL   BUN 39 (*) 6 - 23 mg/dL   Creatinine, Ser 1.75 (*) 0.50 - 1.35 mg/dL   Calcium 7.9 (*) 8.4 - 10.5 mg/dL   GFR calc non Af Amer 40 (*) >90 mL/min   GFR calc Af Amer 46 (*) >90 mL/min   Comment: (NOTE)     The eGFR has been calculated using the CKD EPI equation.     This calculation has not been validated in all clinical situations.   eGFR's persistently <90 mL/min signify possible Chronic Kidney     Disease.  CBC     Status: Abnormal   Collection Time    01/07/14  6:06 AM      Result Value Ref Range   WBC 13.4 (*) 4.0 - 10.5 K/uL   RBC 3.35 (*) 4.22 - 5.81 MIL/uL   Hemoglobin 8.2 (*) 13.0 - 17.0 g/dL   HCT 24.6 (*) 39.0 - 52.0 %   MCV 73.4 (*) 78.0 - 100.0 fL   MCH 24.5 (*) 26.0 - 34.0 pg   MCHC 33.3  30.0 - 36.0 g/dL   RDW 17.7 (*) 11.5 - 15.5 %   Platelets 352  150 - 400 K/uL  HIV ANTIBODY (ROUTINE TESTING)     Status: None   Collection Time    01/07/14  6:06 AM      Result Value Ref Range   HIV NON REACTIVE  NON REACTIVE   Comment: Performed at [place], CAPILLARY     Status: Abnormal   Collection Time    01/07/14  7:07 AM  Result Value Ref Range   Glucose-Capillary 131 (*) 70 - 99 mg/dL  GLUCOSE, CAPILLARY     Status: Abnormal   Collection Time    01/07/14 11:32 AM      Result Value Ref Range   Glucose-Capillary 136 (*) 70 - 99 mg/dL  CBC     Status: Abnormal   Collection Time    01/07/14  3:11 PM      Result Value Ref Range   WBC 10.8 (*) 4.0 - 10.5 K/uL   RBC 3.39 (*) 4.22 - 5.81 MIL/uL   Hemoglobin 8.2 (*) 13.0 - 17.0 g/dL   HCT 25.2 (*) 39.0 - 52.0 %   MCV 74.3 (*) 78.0 - 100.0 fL   MCH 24.2 (*) 26.0 - 34.0 pg   MCHC 32.5  30.0 - 36.0 g/dL   RDW 17.9 (*) 11.5 - 15.5 %   Platelets 329  150 - 400 K/uL  COMPREHENSIVE METABOLIC PANEL     Status: Abnormal   Collection Time    01/07/14  3:11 PM      Result Value Ref Range   Sodium 133 (*) 137 - 147 mEq/L   Potassium 4.4  3.7 - 5.3 mEq/L   Chloride 100  96 - 112 mEq/L   CO2 18 (*) 19 - 32 mEq/L   Glucose, Bld 191 (*) 70 - 99 mg/dL   BUN 42 (*) 6 - 23 mg/dL   Creatinine, Ser 1.81 (*) 0.50 - 1.35 mg/dL   Calcium 7.4 (*) 8.4 - 10.5 mg/dL   Total Protein 5.9 (*) 6.0 - 8.3 g/dL   Albumin 1.3 (*) 3.5 - 5.2 g/dL   AST 43 (*) 0 - 37 U/L   ALT 32  0 - 53 U/L   Alkaline Phosphatase 177 (*) 39 - 117 U/L   Total Bilirubin  0.4  0.3 - 1.2 mg/dL   GFR calc non Af Amer 38 (*) >90 mL/min   GFR calc Af Amer 45 (*) >90 mL/min   Comment: (NOTE)     The eGFR has been calculated using the CKD EPI equation.     This calculation has not been validated in all clinical situations.     eGFR's persistently <90 mL/min signify possible Chronic Kidney     Disease.  TROPONIN I     Status: None   Collection Time    01/07/14  3:15 PM      Result Value Ref Range   Troponin I <0.30  <0.30 ng/mL   Comment:            Due to the release kinetics of cTnI,     a negative result within the first hours     of the onset of symptoms does not rule out     myocardial infarction with certainty.     If myocardial infarction is still suspected,     repeat the test at appropriate intervals.  LACTIC ACID, PLASMA     Status: None   Collection Time    01/07/14  3:16 PM      Result Value Ref Range   Lactic Acid, Venous 2.0  0.5 - 2.2 mmol/L  BLOOD GAS, ARTERIAL     Status: Abnormal   Collection Time    01/07/14  3:39 PM      Result Value Ref Range   FIO2 1.00     O2 Content 15.0     Delivery systems NON-REBREATHER OXYGEN MASK     pH, Arterial 7.417  7.350 - 7.450   pCO2 arterial 31.9 (*) 35.0 - 45.0 mmHg   pO2, Arterial 247.0 (*) 80.0 - 100.0 mmHg   Bicarbonate 20.2  20.0 - 24.0 mEq/L   TCO2 21.2  0 - 100 mmol/L   Acid-base deficit 3.6 (*) 0.0 - 2.0 mmol/L   O2 Saturation 99.7     Patient temperature 98.6     Collection site RADIAL     Drawn by [id number]     Sample type ARTERIAL     Allens test (pass/fail) PASS  PASS  GLUCOSE, CAPILLARY     Status: Abnormal   Collection Time    01/07/14  3:46 PM      Result Value Ref Range   Glucose-Capillary 235 (*) 70 - 99 mg/dL  GLUCOSE, CAPILLARY     Status: Abnormal   Collection Time    01/07/14  5:08 PM      Result Value Ref Range   Glucose-Capillary 185 (*) 70 - 99 mg/dL    Dg Chest Port 1 View  01/07/2014   CLINICAL DATA:  Shortness of breath.  EXAM: PORTABLE CHEST - 1 VIEW   COMPARISON:  01/05/2014.  FINDINGS: The cardiac silhouette, mediastinal and hilar contours are Stable. Mild cardiac enlargement. There are bilateral effusions, left greater than right with overlying atelectasis. Low lung volumes with vascular crowding. Mild central vascular congestion but no overt pulmonary edema. The bony thorax is intact.  IMPRESSION: Persistent effusions and atelectasis.  Low lung volumes with vascular crowding.   Electronically Signed   By: Kalman Jewels M.D.   On: 01/07/2014 16:40   Dg Abd Portable 1v  01/07/2014   CLINICAL DATA:  Severe right-side abdominal pain  EXAM: PORTABLE ABDOMEN - 1 VIEW  COMPARISON:  Portable exam 1622 hr without priors for comparison  FINDINGS: Scattered bowel loops.  No bowel dilatation or bowel wall thickening.  Small left pleural effusion.  Slightly increased attenuation of the abdomen and nonvisualization of hepatic and splenic margins could reflect ascites.  Bones unremarkable.  No definite urinary tract calcification.  IMPRESSION: Nonobstructive bowel gas pattern.  Small left pleural effusion.  Cannot exclude ascites.   Electronically Signed   By: Lavonia Dana M.D.   On: 01/07/2014 16:32    ROS Blood pressure 80/50, pulse 56, temperature 98.8 F (37.1 C), temperature source Oral, resp. rate 24, height '5\' 5"'  (1.651 m), weight 68.4 kg (150 lb 12.7 oz), SpO2 98.00%. Physical Exam  Unable to examine patient but dr Amayia Ciano did review pictures attached to dr Lucianne Lei dam's note.    Assessment/Plan: Left diabetic foot cellulitis.  Will re-evaluate patient when he is more stable.  Will possibly need amputation.  Mri pending.   Continue antibiotics.    Lanae Crumbly 01/07/2014, 6:19 PM   Patient now intubated in MICU Pulmonary compromise with seizure Unable to perform full H+P Will monitor patient - once stable will re-evaluate ulcer to determine treatment plan

## 2014-01-07 NOTE — Consult Note (Signed)
Name: Joshua [name] MRN: [id number] DOB: June 28, 1951    ADMISSION DATE:  01/06/2014 CONSULTATION DATE:  2/13  REFERRING MD :  Graciella Freer PRIMARY SERVICE: IMTS  BRIEF PATIENT DESCRIPTION:  63 yo [place] man with Htn, DM2, previous treatment for extrapulmonary TB admitted 2/12 to IMTS with L foot ulcer with increasing pain, chills for treatment of infected DM foot ulcer/possible osteomyelitis.. On evening of 2/13 developed AMS, hypotension and bradycardia after dose of verapamil. Transferred to ICU where he suffered witnessed seizure. Underwent intubation for stabilization and to allow for diagnostic eval (CT chest, CT head).    SIGNIFICANT EVENTS / STUDIES:  2/12 Admitted to IMTS with dx of infected diabetic foot ulcer, R/O osteromyelitis 2/13 Transferred to ICU with AMS, hypotension, bradycardia after verapamil 2/13 Witnessed seizure after transfer to ICU 2/13 LE venous duplex: positive for bilat peroneal DVTs 2/13 CT head: 2/13 CT chest:   LINES / TUBES: ETT 2/13 >>  R Dickson CVL 2/13 >>   CULTURES: Blood 2/13 >>   ANTIBIOTICS: Vanc 2/12 >> 2/13 Linezolid 2/13 >>  Pip-tazo 2/13 >>   HISTORY OF PRESENT ILLNESS:   Pt unable to provide history. Admission note reviewed. On this evaluation he was somnolent but could accurately identify where he was. In course of interview, suffered seizure with clonus of whole body, clenching of teeth and eyes rolling back. Episode lasted approx 30-60 secs followed by unresponsiveness and R gaze preference.  PAST MEDICAL HISTORY :  Past Medical History  Diagnosis Date  . 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 Hypertension 10/14/2012  . Type 2 diabetes mellitus   . Diabetic nephropathy with proteinuria   . 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 Knee arthroscopy  2010    3 times, right knee   Prior to Admission medications   Medication Sig Start Date End Date Taking?  Authorizing Provider  hydrochlorothiazide (MICROZIDE) 12.5 MG capsule Take 12.5 mg by mouth daily.   Yes Historical Provider, MD  lisinopril (PRINIVIL,ZESTRIL) 40 MG tablet Take 1 tablet (40 mg total) by mouth daily. 11/26/13 11/26/14 Yes Duwaine Maxin, DO  metFORMIN (GLUCOPHAGE) 500 MG tablet Take 2 tablets (1,000 mg total) by mouth 2 (two) times daily with a meal. 11/26/13 11/26/14 Yes Alex Redmond Pulling, DO  verapamil (CALAN-SR) 180 MG CR tablet Take 1 tablet (180 mg total) by mouth daily. 01/05/14 01/05/15 Yes Duwaine Maxin, DO  glucose monitoring kit (FREESTYLE) monitoring kit 1 each by Does not apply route as needed for other. 12/04/12   Neta Ehlers, MD   No Known Allergies  FAMILY HISTORY:  No family history on file. SOCIAL HISTORY:  reports that he quit smoking about 21 years ago. His smoking use included Cigarettes. He smoked 0.00 packs per day for 20 years. He has never used smokeless tobacco. He reports that he does not drink alcohol or use illicit drugs.  REVIEW OF SYSTEMS:  N/A  SUBJECTIVE:   VITAL SIGNS: Temp:  [97.4 F (36.3 C)-102.3 F (39.1 C)] 97.4 F (36.3 C) (02/13 1933) Pulse Rate:  [56-120] 96 (02/13 2000) Resp:  [12-25] 14 (02/13 2000) BP: (67-183)/(45-128) 121/85 mmHg (02/13 2000) SpO2:  [87 %-100 %] 100 % (02/13 2000) FiO2 (%):  [40 %] 40 % (02/13 1818) Weight:  [68.4 kg (150 lb 12.7 oz)] 68.4 kg (150 lb 12.7 oz) (02/12 2254) HEMODYNAMICS:   VENTILATOR SETTINGS: Vent Mode:  [-] PRVC FiO2 (%):  [40 %] 40 %  Set Rate:  [16 bmp] 16 bmp Vt Set:  [500 mL] 500 mL PEEP:  [5 cmH20] 5 cmH20 Plateau Pressure:  [23 cmH20] 23 cmH20 INTAKE / OUTPUT: Intake/Output     02/13 0701 - 02/14 0700   P.O. 380   I.V. (mL/kg) 1813.3 (26.5)   IV Piggyback 1550   Total Intake(mL/kg) 3743.3 (54.7)   Urine (mL/kg/hr) 175 (0.2)   Total Output 175   Net +3568.3       Urine Occurrence 1 x   Stool Occurrence 1 x     PHYSICAL EXAMINATION: General:  WDWN, somnolent Neuro:  MAEs, CNs  initially intact prior to seizure, DTRs symmetric HEENT: NCAT, EOMI, PERRL Neck: JVD to angle of jaw @ 60 degrees Cardiovascular: distant HS, regular, no M noted Lungs: No wheezes, diminshed BS in B bases Abdomen: mildly distended, soft, NT, +BS Ext: mild L>R pedal edema, ulcer on plantar surface at base of L great toe  LABS: CBC  Recent Labs Lab 01/05/14 1616 01/07/14 0606 01/07/14 1511  WBC 14.1* 13.4* 10.8*  HGB 9.3* 8.2* 8.2*  HCT 28.8* 24.6* 25.2*  PLT 439* 352 329   Coag's No results found for this basename: APTT, INR,  in the last 168 hours BMET  Recent Labs Lab 01/05/14 1616 01/07/14 0606 01/07/14 1511  NA 140 138 133*  K 4.4 3.9 4.4  CL 103 102 100  CO2 25 22 18*  BUN 27* 39* 42*  CREATININE 1.14 1.75* 1.81*  GLUCOSE 118* 138* 191*   Electrolytes  Recent Labs Lab 01/05/14 1616 01/07/14 0606 01/07/14 1511  CALCIUM 8.7 7.9* 7.4*   Sepsis Markers  Recent Labs Lab 01/07/14 1516  LATICACIDVEN 2.0   ABG  Recent Labs Lab 01/07/14 1539 01/07/14 1921  PHART 7.417 7.302*  PCO2ART 31.9* 38.9  PO2ART 247.0* 298.0*   Liver Enzymes  Recent Labs Lab 01/05/14 1616 01/07/14 1511  AST 32 43*  ALT 33 32  ALKPHOS 179* 177*  BILITOT 0.3 0.4  ALBUMIN 2.0* 1.3*   Cardiac Enzymes  Recent Labs Lab 01/07/14 1515 01/07/14 1830  TROPONINI <0.30 <0.30   Glucose  Recent Labs Lab 01/07/14 0140 01/07/14 0707 01/07/14 1132 01/07/14 1546 01/07/14 1708 01/07/14 1904  GLUCAP 134* 131* 136* 235* 185* 260*    CXR: CM, edema pattern, acute on chronic B effusions  ASSESSMENT / PLAN:  PULMONARY A: Acute resp failure - intubated primarily for AMS Pulm edema pattern on CXR Chronic pleural effusions P:   Vent settings established Vent bundle implemented Daily SBT as indicated  CARDIOVASCULAR A: H/O Htn Hypotension, bradycardia after verapamil R/O septic shock Bilateral LE DVTs Markedly elevated CVP (severe JVD)   query pulmonary  hypertension vs constrictive pericarditis  P:  Monitor CVP Dopamine to maintain MAP > 65 mmHg R/O MI with serial biomarkers Full anticoagulation with heparin (hold until CT head confirms no bleed) Echocardiogram ordered  RENAL A:  Acute renal insuff CKD Oliguria due to hypotension P:   Monitor BMET intermittently Monitor I/Os Correct electrolytes as indicated   GASTROINTESTINAL A:  Abdominal pain (concern for mesenteric ischemia in setting of hypotension) P:   SUP: enteral pantoprazole Consider TFs 2/14  HEMATOLOGIC A:  Microcytic anemia without overt acute blood loss P:  Monitor CBC intermittently Transfuse per usual ICU guidelines  INFECTIOUS A: Severe sepsis Infected diabetic foot ulcer R/O osteo P:   Micro and abx as above ID to continue to follow and direct abx therapy  ENDOCRINE A:  DM2 P:  Change SSI to q 4 hrs  NEUROLOGIC A:  Acute encephalopathy New onset seizure P:   Sedation with intermittent fentanyl and lorazepam Initiate Keppra CT head ordered EEG ordered   TODAY'S SUMMARY:   I have personally obtained a history, examined the patient, evaluated laboratory and imaging results, formulated the assessment and plan and placed orders. CRITICAL CARE: The patient is critically ill with multiple organ systems failure and requires high complexity decision making for assessment and support, frequent evaluation and titration of therapies, application of advanced monitoring technologies and extensive interpretation of multiple databases. Critical Care Time devoted to patient care services described in this note is 60 minutes.   Merton Border, MD ; [facility] [phone].  After 5:30 PM or weekends, call [phone] 01/07/2014, 8:45 PM

## 2014-01-07 NOTE — Progress Notes (Signed)
ANTICOAGULATION CONSULT NOTE - Initial Consult  Pharmacy Consult for Heparin Indication: DVT  No Known Allergies  Patient Measurements: Height: _0  (165.1 cm) Weight: 150 lb 12.7 oz (68.4 kg) IBW/kg (Calculated) : 61.5 Heparin Dosing Weight: 68.4 kg  Vital Signs: Temp: 97.4 F (36.3 C) (02/13 1933) Temp src: Oral (02/13 1933) BP: 129/67 mmHg (02/13 2049) Pulse Rate: 91 (02/13 2049)  Labs:  Recent Labs  01/05/14 1616 01/07/14 0606 01/07/14 1511 01/07/14 1515 01/07/14 1830  HGB 9.3* 8.2* 8.2*  --   --   HCT 28.8* 24.6* 25.2*  --   --   PLT 439* 352 329  --   --   CREATININE 1.14 1.75* 1.81*  --   --   TROPONINI  --   --   --  <0.30 <0.30    Estimated Creatinine Clearance: 36.8 ml/min (by C-G formula based on Cr of 1.81).   Medical History: Past Medical History  Diagnosis Date  . 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 Hypertension 10/14/2012  . Type 2 diabetes mellitus   . Diabetic nephropathy with proteinuria   . 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 hydrochlorothiazide (MICROZIDE) 12.5 MG capsule Take 12.5 mg by mouth daily.      Marland Kitchen lisinopril (PRINIVIL,ZESTRIL) 40 MG tablet Take 1 tablet (40 mg total) by mouth daily.  30 tablet  2  . metFORMIN (GLUCOPHAGE) 500 MG tablet Take 2 tablets (1,000 mg total) by mouth 2 (two) times daily with a meal.  60 tablet  2  . verapamil (CALAN-SR) 180 MG CR tablet Take 1 tablet (180 mg total) by mouth daily.  30 tablet  2  . glucose monitoring kit (FREESTYLE) monitoring kit 1 each by Does not apply route as needed for other.  1 each     Scheduled:  . [START ON 01/08/2014] aspirin  325 mg Per Tube Daily  . heparin subcutaneous  5,000 Units Subcutaneous 3 times per day  . insulin aspart  0-9 Units Subcutaneous 6 times per day  . levETIRAcetam  1,000 mg Intravenous Once  . [START ON 01/08/2014] levETIRAcetam  500 mg  Intravenous Q12H  . linezolid  600 mg Intravenous Q12H  . [START ON 01/08/2014] pantoprazole sodium  40 mg Per Tube Q1200   Infusions:  . sodium chloride 100 mL/hr at 01/07/14 0208  . DOPamine 7 mcg/kg/min (01/07/14 1852)    Assessment: 63 y.o. Male with DM2, HTN and pulmonary tuberculosis who was  admitted today with diabetic foot ulcer.  He has LLE positive for acute deep vein thrombosis involving bilateral peroneal veins per LLE venous duplex.  PLTC =329, H/H = 8.2/25.2.  Now to start on IV heparin infusion.  Discontinue the SQ heparin.  Last SQ heparin dose given @ 13:48 today.    Goal of Therapy:  Heparin level 0.3-0.7 units/ml Monitor platelets by anticoagulation protocol: Yes   Plan:  Heparin 4000 units IV bolus x1 Start IV heparin drip at 1100 units/hr Check 6 hour heparin level and CBC. Monitor daily heparin level and CBC.   Nicole Cella, RPh Clinical Pharmacist Pager: [phone] 01/07/2014,9:13 PM

## 2014-01-07 NOTE — Progress Notes (Signed)
14:15 patient began complaining of indigestion and asking for antacid medication.  When asked to describe symptoms, patient stated that "the food would not go down" and that "he needed to burp."  BP obtained at 14:30 with a result of 88/53 with a HR of  66.  Manual blood pressure confirmed with a reading of 90/55, patient very lethargic but still able to answer orientation questions.  MD made aware of blood pressure and indigestion.  Patient began to complain of sudden sharp right upper quadrant abdominal pain.  MD arrived at bedside.  SpO2 obtained with a  Result of 84% room air and had labored breathing.  Rapid Response nurse called.  Patient placed on non-rebreather with SpO2 of 100%.   Patient given 1L NS bolus.  CBG obtained with result of 253.  Patient continued to complain of abdominal pain and flinched on palpation.  EKG obtained. Portable CXR and KUB obtained.  IV Vancomycin and Zosyn initiated.  ABGs and labs drawn. Patient BP not responding to 1L bolus with BP of 67/45.  Report called to nurse on 16M.  Patient transferred with Rapid Response nurse,  Unit manager, and attending.

## 2014-01-07 NOTE — Procedures (Addendum)
PROCEDURE NOTE: R Gans CVL PLACEMENT  INDICATION:    Monitoring of central venous pressures and/or administration of medications optimally administered in central vein  CONSENT:   Placed as urgent procedure. A time out was performed to review patient identification, procedure to be performed, correct patient position, medications/allergies/relevent history, required imaging and test results.  PROCEDURE  Maximum sterile technique was used including antiseptics, cap, gloves, gown, hand hygiene, mask and sheet.  Skin prep: Chlorhexidine; local anesthetic administered  A antimicrobial bonded/coated triple lumen catheter was placed in the R Sinclair CVL using the Seldinger technique.    EVALUATION:  Blood flow good  Complications: No apparent complications  Patient tolerated the procedure well.  Chest X-ray ordered to verify placement and is pending   Merton Border, MD PCCM service Mobile [phone]

## 2014-01-07 NOTE — H&P (Signed)
Date: 01/07/2014               Patient Name:  Joshua [name] MRN: [id number]  DOB: 02-Dec-1950 Age / Sex: 63 y.o., male   PCP: Duwaine Maxin, DO         Medical Service: Internal Medicine Teaching Service         Attending Physician: Dr. Truman Hayward, MD    First Contact: Konrad Saha, MS4 Pager: [phone]  Second Contact: Dr. Alice Rieger Pager: [phone]       After Hours (After 5p/  First Contact Pager: [phone]  weekends / holidays): Second Contact Pager: [phone]   Chief Complaint: foot ulcer  History of Present Illness:  The patient is a 63 yo man, history of DM2, HTN, presenting with a foot ulcer.  The patient notes a left plantar foot ulcer, proximal to the 1st MTP, present for about the last 1 year (per pt report), though gradually increasing in size, now with increasing pain with weight bearing.  Associated with chills, but no fever, lightheadedness, or nausea.  The patient was seen in clinic 2/11, at which time he was noted to have an elevated WBC = 14.1, ESR = 96, and foot x-ray revealing subcutaneous gas around the lesion.  The patient was seen in clinic again today, and recommended for admission, to rule out osteomyelitis.    Meds: Prescriptions prior to admission  Medication Sig Dispense Refill  . gabapentin (NEURONTIN) 300 MG capsule Begin taking 1 tablet a day for 3 days, and then 1 tablet 2 times a day for 3 days, and then 1 tablet 3 times a day  90 capsule  2  . glipiZIDE (GLUCOTROL) 5 MG tablet Take 1 tablet (5 mg total) by mouth 2 (two) times daily before a meal.  30 tablet  11  . glucose monitoring kit (FREESTYLE) monitoring kit 1 each by Does not apply route as needed for other.  1 each    . hydrochlorothiazide (MICROZIDE) 12.5 MG capsule Take 1 capsule (12.5 mg total) by mouth daily.  30 capsule  1  . lisinopril (PRINIVIL,ZESTRIL) 40 MG tablet Take 1 tablet (40 mg total) by mouth daily.  30 tablet  2  . metFORMIN (GLUCOPHAGE) 500 MG tablet Take 2 tablets (1,000 mg  total) by mouth 2 (two) times daily with a meal.  60 tablet  2  . verapamil (CALAN-SR) 180 MG CR tablet Take 1 tablet (180 mg total) by mouth daily.  30 tablet  2    Allergies: Allergies as of 01/06/2014  . (No Known Allergies)   Past Medical History  Diagnosis Date  . 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 Hypertension 10/14/2012  . Type 2 diabetes mellitus   . Diabetic nephropathy with proteinuria   . 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 Knee arthroscopy  2010    3 times, right knee   No family history on file. History   Social History  . Marital Status: Single    Spouse Name: N/A    Number of Children: 5  . 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 Smoking status: Former Smoker -- 20 years    Types: Cigarettes    Quit date: 11/25/1992  . Smokeless tobacco: Never Used  . Alcohol Use: No     Comment: Rarely.  . Drug Use: No  .  Sexual Activity: Not on file     Comment: 09/2012: > 15 partners in past 3 years, inconsistent condom use   Other Topics Concern  . Not on file   Social History Narrative   Has a masters degree in education    Review of Systems: General: no fevers, chills, changes in weight, changes in appetite Skin: no rash HEENT: no blurry vision, hearing changes, sore throat Pulm: +mild SOB for the last few weeks, worse after eating, associated with heartburn CV: no chest pain, palpitations Abd: no abdominal pain, nausea/vomiting, diarrhea/constipation GU: no dysuria, hematuria, polyuria Ext: see HPI Neuro: no weakness, numbness, or tingling  Physical Exam: Blood pressure 183/100, pulse 120, temperature 98.6 F (37 C), temperature source Oral, height '5\' 5"'  (1.651 m), weight 150 lb 12.7 oz (68.4 kg), SpO2 97.00%. General: alert, cooperative, and in no apparent distress HEENT: PERRL, EOMI, mucous membranes  dry Neck: supple Lungs: normal work of respiration, clear to ascultation bilaterally, no wheezes, rales, or ronchi Heart: regular rhythm, tachycardic, no m/g/r Abdomen: soft, non-tender, non-distended, normal bowel sounds Extremities: No edema (compression hose in place).  Left plantar surface proximal to 1st MTP with deep ulcer, with subcutaneous fat visible, though no obvious bone or muscle Neurologic: alert & oriented X3, cranial nerves II-XII intact, strength grossly intact, sensation intact to light touch  Lab results: Basic Metabolic Panel:  Recent Labs  01/05/14 1616  NA 140  K 4.4  CL 103  CO2 25  GLUCOSE 118*  BUN 27*  CREATININE 1.14  CALCIUM 8.7   Liver Function Tests:  Recent Labs  01/05/14 1616  AST 32  ALT 33  ALKPHOS 179*  BILITOT 0.3  PROT 7.3  ALBUMIN 2.0*   CBC:  Recent Labs  01/05/14 1616  WBC 14.1*  NEUTROABS 11.1*  HGB 9.3*  HCT 28.8*  MCV 74.0*  PLT 439*   CBG:  Recent Labs  01/05/14 1439  GLUCAP 144*   Fasting Lipid Panel:  Recent Labs  01/05/14 1616  CHOL 175  HDL 83  LDLCALC 81  TRIG 53  CHOLHDL 2.1   Imaging results:  Dg Chest 2 View  01/05/2014   CLINICAL DATA:  Reflux and chest congestion after eating.  EXAM: CHEST  2 VIEW  COMPARISON:  03/30/2013  FINDINGS: There is volume loss at the right lung base compared to the previous examination. Difficult to exclude bronchiectasis at the right lung base. There is no evidence for airspace disease within the right lung. Prominent right hilar structures are probably related to vascular crowding but this has clearly progressed since 08/31/2010. There are chronic pleural-based densities at the left lung base which could be related to volume loss and pleural thickening. Upper lungs are clear. Heart size is stable and within normal limits. The trachea is midline.  IMPRESSION: Chronic left basilar densities are suggestive for pleural-based disease. Findings could be related to a  loculated effusion or pleural thickening.  Volume loss and probable vascular crowding at the right lung base. Recommend follow-up to ensure improved aeration at the right lung base and follow the right hilar densities. Alternately, consider further evaluation with a chest CT which could also evaluate the left pleural-based disease.   Electronically Signed   By: Markus Daft M.D.   On: 01/05/2014 17:41   Dg Foot Complete Left  01/05/2014   CLINICAL DATA:  Left-sided foot pain.  EXAM: LEFT FOOT - COMPLETE 3+ VIEW  COMPARISON:  No priors.  FINDINGS: Three views of the left  foot demonstrate diffuse soft tissue swelling of the distal aspect of the great toe. Along the plantar surface of the foot there are several locular some gas in the swollen soft tissues beneath the distal first metatarsal, concerning for an ulcer and soft tissue infection. No definite osteolysis in the underlying bone to strongly suggest the presence of osteomyelitis at this time. No acute displaced fracture, subluxation or dislocation.  IMPRESSION: 1. Probable soft tissue ulceration/infection with gas in the plantar aspect of the foot beneath the great toe (adjacent to the distal first metatarsal). No definite signs to strongly suggest frank osteomyelitis at this time, but clinical correlation is recommended.   Electronically Signed   By: Vinnie Langton M.D.   On: 01/05/2014 17:30    Assessment & Plan by Problem: # Left foot cellulitis - The patient has a stage III/IV ulcer, with x-ray showing gas in the soft tissue, concerning for possible underlying osteomyelitis (though no osteo seen on x-ray). -check MRI left foot -hold antibiotics for now, as patient is hemodynamically stable, start broad specrtum antibiotics after MRI findings and biopsy as recommended. -checking HIV antibody (last negative 2013) - Blood cultures.  # Hypertension - chart review reveals that patient's BP is chronically elevated, despite home HCTZ, verapamil, and  lisinopril.  BP currently 183/100. -continue home lisinopril 40 -increase verapamil from 160 to 240 mg daily -increase HCTZ from 12.5 to 25 mg daily -will give labetalol 10 IV once now, and add hydralazine 10 q4prn for SBP > 170  # Shortness of breath - the patient notes vague symptoms of SOB, associated with heartburn, worse after eating, likely representing uncontrolled GERD.  CXR shows chronic left basilar densities (?related to TB history), and right-sided volume loss (?related to shallow breathing secondary to uncontrolled GERD).  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 Quentin Cornwall.  No acute cough or hemoptysis to suggest any recurrence. Chest xray recs are to consider further evaluation with Chest Ct. -start protonix for GERD -start IS for right-sided volume loss  # Acute on Chronic Kidney Disease - Cr mildly elevated to 1.14 on labs 2/11, from baseline of 0.8.  Etiology is likely prerenal, with tachycardia, dry mucous membranes, and BUN:Cr >20.  Of note, the patient also has significant proteinuria, likely secondary to diabetes. -NS @ 100 cc/hr -regular diet -continue lisinopril for proteinuria  # Diabetes Mellitus - last A1C = 6.5 last month -SSI  # Prophy - heparin  Dispo: Disposition is deferred at this time, awaiting improvement of current medical problems. Anticipated discharge in approximately 1-2 day(s).   The patient does have a current PCP Duwaine Maxin, DO) and does need an [facility] hospital follow-up appointment after discharge.  The patient does have transportation limitations that hinder transportation to clinic appointments.

## 2014-01-07 NOTE — Consult Note (Signed)
WOC consulted however based on xray report would be more appropriate for ortho to consult and considered outside of the scope of our practice.   Xray left foot:  Probable soft tissue ulceration/infection with gas in the plantar aspect of the foot beneath the great toe (adjacent to the distal first metatarsal). No definite signs to strongly suggest frank osteomyelitis.  MRI pending.   Marica Otter RN,CWOCN [phone]

## 2014-01-07 NOTE — Progress Notes (Signed)
Medical Student Daily Progress Note  Subjective: Fever of 102.35F overnight with WBC of 13.4, and was treated with 650mg  Tylenol. Patient does not report of SOB, cough, or chest pain. No urinary changes.   Objective: Vital signs in last 24 hours: Filed Vitals:   01/07/14 0210 01/07/14 0320 01/07/14 0541 01/07/14 0943  BP: 145/84  126/70 123/70  Pulse: 109  95 87  Temp:  102.3 F (39.1 C) 98.7 F (37.1 C) 98.4 F (36.9 C)  TempSrc:  Oral Oral   Resp:    18  Height:      Weight:      SpO2:   96% 90%   Weight change:   Intake/Output Summary (Last 24 hours) at 01/07/14 1210 Last data filed at 01/07/14 0900  Gross per 24 hour  Intake    330 ml  Output    525 ml  Net   -195 ml   Physical Exam: BP 123/70  Pulse 87  Temp(Src) 98.4 F (36.9 C) (Oral)  Resp 18  Ht 5\' 5"  (1.651 m)  Wt 68.4 kg (150 lb 12.7 oz)  BMI 25.09 kg/m2  SpO2 90% General appearance: alert, cooperative and no distress Back: symmetric, no curvature. ROM normal. No CVA tenderness. Lungs: clear to auscultation bilaterally Chest wall: no tenderness Heart: S1, S2 normal and regular rhythm, tachycardia, no murmurs, rubs, or gallops Abdomen: soft, non-tender; bowel sounds normal; no masses,  no organomegaly Extremities: Left extremities more swollen than RLE. LLE has 3+ pedal edema while RLE has 2+ pedal edema. Left plantar ulcer (1cm in diameter, about 0.22mm deep) beneath left large toe, and subcutaneous fat can be seen. Crusting seen on R large toe.  Lab Results:   CBC  Micro Results: No results found for this or any previous visit (from the past 240 hour(s)). Studies/Results: Dg Chest 2 View  01/05/2014   CLINICAL DATA:  Reflux and chest congestion after eating.  EXAM: CHEST  2 VIEW  COMPARISON:  03/30/2013  FINDINGS: There is volume loss at the right lung base compared to the previous examination. Difficult to exclude bronchiectasis at the right lung base. There is no evidence for airspace disease within  the right lung. Prominent right hilar structures are probably related to vascular crowding but this has clearly progressed since 08/31/2010. There are chronic pleural-based densities at the left lung base which could be related to volume loss and pleural thickening. Upper lungs are clear. Heart size is stable and within normal limits. The trachea is midline.  IMPRESSION: Chronic left basilar densities are suggestive for pleural-based disease. Findings could be related to a loculated effusion or pleural thickening.  Volume loss and probable vascular crowding at the right lung base. Recommend follow-up to ensure improved aeration at the right lung base and follow the right hilar densities. Alternately, consider further evaluation with a chest CT which could also evaluate the left pleural-based disease.   Electronically Signed   By: Markus Daft M.D.   On: 01/05/2014 17:41   Dg Foot Complete Left  01/05/2014   CLINICAL DATA:  Left-sided foot pain.  EXAM: LEFT FOOT - COMPLETE 3+ VIEW  COMPARISON:  No priors.  FINDINGS: Three views of the left foot demonstrate diffuse soft tissue swelling of the distal aspect of the great toe. Along the plantar surface of the foot there are several locular some gas in the swollen soft tissues beneath the distal first metatarsal, concerning for an ulcer and soft tissue infection. No definite osteolysis in the underlying  bone to strongly suggest the presence of osteomyelitis at this time. No acute displaced fracture, subluxation or dislocation.  IMPRESSION: 1. Probable soft tissue ulceration/infection with gas in the plantar aspect of the foot beneath the great toe (adjacent to the distal first metatarsal). No definite signs to strongly suggest frank osteomyelitis at this time, but clinical correlation is recommended.   Electronically Signed   By: Vinnie Langton M.D.   On: 01/05/2014 17:30   Medications: I have reviewed the patient's current medications. Scheduled Meds: . gabapentin   300 mg Oral TID  . heparin subcutaneous  5,000 Units Subcutaneous 3 times per day  . hydrochlorothiazide  25 mg Oral Daily  . insulin aspart  0-5 Units Subcutaneous QHS  . insulin aspart  0-9 Units Subcutaneous TID WC  . lisinopril  40 mg Oral Daily  . pantoprazole  40 mg Oral Daily  . verapamil  240 mg Oral Daily   Continuous Infusions: . sodium chloride 100 mL/hr at 01/07/14 0208   PRN Meds:.hydrALAZINE Assessment/Plan: Principal Problem:   Cellulitis Active Problems:   Hypertension   Diabetes mellitus with renal manifestation   LOS: 1 day   [name] is a 63 yo man with history of T2DM and HTN who presents with chronic foot ulcer proximal to the 1st MTP with unknown wound care history.   # Left foot cellulitis: The patient has a stage III/IV ulcer, with x-ray showing gas in soft tissue, concerning for possible underlying osteomyelitis but no osteo seen on x-ray. Wound consult believes that it is out of their scoop of care, and ortho is consulted for possible wound debridement.  -F/u MRI left foot -Hold antibiotics for now, as patient is hemodynamically stable  -HIV antibodies: pending -Blood cultures: pending -CBC in AM  # Asymmetrical swelling with L foot larger: Patient's SOB with unilateral leg swelling is concerning for possible DVT and PE. -L lower extremity ultrasound duplex f/u  # Hypertension: BP 183/100 on admission despite being treated with HCTZ, verapamil, and lisinopril. Current BP is 123/70. -continue home lisinopril 40  -increase verapamil from 160 to 240 mg daily  -increase HCTZ from 12.5 to 25 mg daily  -will give labetalol 10 IV once now, and add hydralazine 10 q4prn for SBP > 170  # Shortness of breath- the patient notes vague symptoms of SOB, associated with heartburn, worse after eating, likely representing uncontrolled GERD. CXR shows chronic left basilar densities (?related to TB history), and right-sided volume loss (?related to shallow breathing  secondary to uncontrolled GERD). 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 Quentin Cornwall. No acute cough or hemoptysis to suggest any recurrence. Chest xray recs are to consider further evaluation with Chest Ct.  -start protonix for GERD  -start IS for right-sided volume loss  # Acute on Chronic Kidney Disease - Cr increased to 1.75 from 1.14 on labs 2/11, from baseline of 0.8. Etiology is likely prerenal, with tachycardia, dry mucous membranes, and BUN:Cr >20. Of note, the patient also has significant proteinuria, likely secondary to diabetes.  -Urine Na, creatinine, and urea -Urine protein/creatinine ratio -NS @ 100 cc/hr  -regular diet  -continue lisinopril for proteinuria -F/u BMET in AM  # Diabetes Mellitus - last A1C = 6.5 last month -SSI   # Prophy  -DVT: UFH [place]   This is a Careers information officer Note.  The care of the patient was discussed with Dr. Tommy Medal and the assessment and  plan formulated with their assistance.  Please see their attached note for official documentation of the daily encounter.  Konrad Saha 01/07/2014, 12:10 PM

## 2014-01-07 NOTE — Progress Notes (Signed)
I have seen the patient and reviewed the daily progress note by Elvin So MS IV and discussed the care of the patient with them. Patient had a change in clinical status with hypoxia and refractory hypotension and he was consequently transferred to ICU. Please see my note regarding his this clinical change.I agree with documentation by MS IV. Signed:  Jessee Avers, MD PGY-2 Internal Medicine Teaching Service Pager: [phone]

## 2014-01-07 NOTE — Significant Event (Signed)
Rapid Response Event Note  Overview:  Called to assist with patient with decreased BP and increasing lethargy with new sudden onset right side abdominal pain Time Called: 1521 Event Type: Hypotension;Neurologic  Initial Focused Assessment:  Arouses to name - follows commands - skin hot and dry - RN reports more lethargic - patient c/o right side pain -tender to palpatation - no rebound pain - abd soft - non-distended - manual BP 72/42 - HR 60 - RR 24 - on NRB mask - bil BS equal and clear - denies pain with inspiration -  Left leg edema noted - has old puncture wound to bottom of foot - denies pain to that area.  TS MD present - Dr. Esau Grew - reports on antiHTN meds started today.  NS infusing right PIV - bolus being given.   Interventions:  2nd IV line inserted left forearm by Sandy Salaam, RN  - NS infusing . Stat call for ABG - labs - PCXR and PKUB and 12 lead EKG.   Rectal temp done - 100.1.  CBG done 243.  Intermittent periods of restlessness and pain to side.  Continue with fluid bolus - BP remains refractory - manual 72/44 - HR 60 -12 lead shows inverted pwaves - MD aware.  Abdomen becoming slightly distended and a little firm.  MD aware.  Dr Drucilla Schmidt present.  Consulting with PCCM Dr. Johny Shears.  Manual BP now 67/30 despite 1800 cc NS.  Stat transfer to ICU.  Report given by Casey Burkitt to 2 M staff.  Bedside handoff the [facility].     Event Summary: Name of Physician Notified: Dr.  at  (PTA RRT)  Name of Consulting Physician Notified: PCCM  Dr. Tery Sanfilippo at  (per TS MD via elink)  Outcome: Transferred (Comment)  Event End Time: [place]  Quin Hoop

## 2014-01-07 NOTE — Progress Notes (Signed)
*  Preliminary Results* Left lower extremity venous duplex completed. Left lower extremity is positive for acute deep vein thrombosis involving bilateral peroneal veins. There is no evidence of left Baker's cyst.  01/07/2014 7:06 PM  Maudry Mayhew, RVT, RDCS, RDMS

## 2014-01-08 ENCOUNTER — Inpatient Hospital Stay (HOSPITAL_COMMUNITY): Payer: Self-pay

## 2014-01-08 ENCOUNTER — Other Ambulatory Visit: Payer: Self-pay

## 2014-01-08 ENCOUNTER — Observation Stay (HOSPITAL_COMMUNITY): Payer: Self-pay

## 2014-01-08 DIAGNOSIS — E1129 Type 2 diabetes mellitus with other diabetic kidney complication: Secondary | ICD-10-CM

## 2014-01-08 DIAGNOSIS — I369 Nonrheumatic tricuspid valve disorder, unspecified: Secondary | ICD-10-CM

## 2014-01-08 DIAGNOSIS — R569 Unspecified convulsions: Secondary | ICD-10-CM

## 2014-01-08 DIAGNOSIS — A419 Sepsis, unspecified organism: Secondary | ICD-10-CM | POA: Diagnosis present

## 2014-01-08 LAB — GLUCOSE, CAPILLARY
GLUCOSE-CAPILLARY: 121 mg/dL — AB (ref 70–99)
GLUCOSE-CAPILLARY: 95 mg/dL (ref 70–99)
GLUCOSE-CAPILLARY: 146 mg/dL — AB (ref 70–99)
Glucose-Capillary: 108 mg/dL — ABNORMAL HIGH (ref 70–99)
Glucose-Capillary: 208 mg/dL — ABNORMAL HIGH (ref 70–99)

## 2014-01-08 LAB — COMPREHENSIVE METABOLIC PANEL
ALBUMIN: 1.7 g/dL — AB (ref 3.5–5.2)
ALK PHOS: 223 U/L — AB (ref 39–117)
ALT: 128 U/L — ABNORMAL HIGH (ref 0–53)
AST: 158 U/L — ABNORMAL HIGH (ref 0–37)
BILIRUBIN TOTAL: 0.4 mg/dL (ref 0.3–1.2)
BUN: 37 mg/dL — AB (ref 6–23)
CHLORIDE: 104 meq/L (ref 96–112)
CO2: 20 mEq/L (ref 19–32)
Calcium: 7.7 mg/dL — ABNORMAL LOW (ref 8.4–10.5)
Creatinine, Ser: 1.68 mg/dL — ABNORMAL HIGH (ref 0.50–1.35)
GFR calc Af Amer: 49 mL/min — ABNORMAL LOW (ref >90)
GFR calc non Af Amer: 42 mL/min — ABNORMAL LOW (ref >90)
Glucose, Bld: 126 mg/dL — ABNORMAL HIGH (ref 70–99)
POTASSIUM: 3.8 meq/L (ref 3.7–5.3)
Sodium: 138 mEq/L (ref 137–147)
Total Protein: 7 g/dL (ref 6.0–8.3)

## 2014-01-08 LAB — CBC
HEMATOCRIT: 28.2 % — AB (ref 39.0–52.0)
HEMOGLOBIN: 9.2 g/dL — AB (ref 13.0–17.0)
MCH: 23.9 pg — ABNORMAL LOW (ref 26.0–34.0)
MCHC: 32.6 g/dL (ref 30.0–36.0)
MCV: 73.2 fL — ABNORMAL LOW (ref 78.0–100.0)
Platelets: 433 10*3/uL — ABNORMAL HIGH (ref 150–400)
RBC: 3.85 MIL/uL — AB (ref 4.22–5.81)
RDW: 17.6 % — ABNORMAL HIGH (ref 11.5–15.5)
WBC: 15.5 10*3/uL — AB (ref 4.0–10.5)

## 2014-01-08 LAB — HEPARIN LEVEL (UNFRACTIONATED)
HEPARIN UNFRACTIONATED: 0.36 [IU]/mL (ref 0.30–0.70)
HEPARIN UNFRACTIONATED: 0.52 [IU]/mL (ref 0.30–0.70)
Heparin Unfractionated: 0.21 IU/mL — ABNORMAL LOW (ref 0.30–0.70)

## 2014-01-08 LAB — LIPASE, BLOOD: Lipase: 18 U/L (ref 11–59)

## 2014-01-08 LAB — AMYLASE: AMYLASE: 118 U/L — AB (ref 0–105)

## 2014-01-08 LAB — TROPONIN I
Troponin I: <0.3 ng/mL (ref <0.30)
Troponin I: <0.3 ng/mL (ref <0.30)

## 2014-01-08 MED ORDER — BIOTENE DRY MOUTH MT LIQD
15.0000 mL | Freq: Four times a day (QID) | OROMUCOSAL | Status: DC
  Administered 2014-01-08 – 2014-01-09 (×5): 15 mL via OROMUCOSAL

## 2014-01-08 MED ORDER — HEPARIN BOLUS VIA INFUSION
1000.0000 [IU] | Freq: Once | INTRAVENOUS | Status: AC
  Administered 2014-01-08: 1000 [IU] via INTRAVENOUS
  Filled 2014-01-08: qty 1000

## 2014-01-08 MED ORDER — MIDAZOLAM HCL 2 MG/2ML IJ SOLN
2.0000 mg | Freq: Once | INTRAMUSCULAR | Status: AC
  Administered 2014-01-08: 2 mg via INTRAVENOUS

## 2014-01-08 MED ORDER — HEPARIN (PORCINE) IN NACL 100-0.45 UNIT/ML-% IJ SOLN
1750.0000 [IU]/h | INTRAMUSCULAR | Status: DC
  Administered 2014-01-09 – 2014-01-10 (×2): 1350 [IU]/h via INTRAVENOUS
  Administered 2014-01-10: 1550 [IU]/h via INTRAVENOUS
  Administered 2014-01-12 – 2014-01-14 (×3): 1750 [IU]/h via INTRAVENOUS
  Filled 2014-01-08 (×13): qty 250

## 2014-01-08 MED ORDER — MIDAZOLAM HCL 2 MG/2ML IJ SOLN
INTRAMUSCULAR | Status: AC
  Filled 2014-01-08: qty 2

## 2014-01-08 MED ORDER — PIPERACILLIN-TAZOBACTAM 3.375 G IVPB
3.3750 g | Freq: Three times a day (TID) | INTRAVENOUS | Status: DC
  Administered 2014-01-08 – 2014-01-10 (×7): 3.375 g via INTRAVENOUS
  Filled 2014-01-08 (×11): qty 50

## 2014-01-08 MED ORDER — CHLORHEXIDINE GLUCONATE 0.12 % MT SOLN
15.0000 mL | Freq: Two times a day (BID) | OROMUCOSAL | Status: DC
  Filled 2014-01-08: qty 15

## 2014-01-08 NOTE — Progress Notes (Signed)
EEG Completed; Results Pending  

## 2014-01-08 NOTE — Procedures (Signed)
ELECTROENCEPHALOGRAM REPORT   Patient: Joshua [name]       Room #: [id number] EEG No. ID: [id number] Age: 63 y.o.        Sex: male Referring Physician: Alva Garnet Report Date:  01/08/2014        Interpreting Physician: Alexis Goodell D  History: [name] is an 63 y.o. male admitted with altered mental status and subsequent seizure.  Medications:  Scheduled: . antiseptic oral rinse  15 mL Mouth Rinse QID  . aspirin  325 mg Per Tube Daily  . [START ON 01/09/2014] chlorhexidine  15 mL Mouth Rinse BID  . insulin aspart  0-9 Units Subcutaneous 6 times per day  . levETIRAcetam  500 mg Intravenous Q12H  . linezolid  600 mg Intravenous Q12H  . midazolam      . pantoprazole sodium  40 mg Per Tube Q1200  . piperacillin-tazobactam (ZOSYN)  IV  3.375 g Intravenous Q8H    Conditions of Recording:  This is a 16 channel EEG carried out with the patient in the intubated and sedated state.  Description:  The background activity is dominated by alpha activity that is diffusely distributed over all quadrants.  It is of low to moderate voltage and well organized.  There is underlying polymorphic delta activity that is noted as well.  Anteriorly there is noted well organized beta activity that is seen at times.   There is some sharp activity with phase reversal centrally that appears to be sleep transients.   The patient drowses with slowing to irregular, low voltage theta and beta activity.   The patient goes in to a light sleep with symmetrical sleep spindles, vertex central sharp transients and irregular slow activity.  (The patient does not drowse or sleep.) (Stage II sleep is not obtained.) Hyperventilation and intermittent photic stimulation were not performed.  IMPRESSION: Medication effect dominates this electroencephalogram.  There does appear to be some normal sleep transients.  No epileptiform activity is noted.     Alexis Goodell, MD [facility] [phone] 01/08/2014, 4:16  PM

## 2014-01-08 NOTE — Procedures (Signed)
Name:  CHAZ MENNA MRN:  [id number] DOB:  10/13/1951  PROCEDURE NOTE  Procedure:   Flexible bronchoscopy [id number])  Indications:  "Cut-off sign" on chest CT  Consent:  Procedure, benefits, risks and alternatives discussed.  Questions answered.  Consent obtained.  Anesthesia:  Fentanyl / Versed  Procedure summary:  Appropriate equipment was assembled.  The patient was identified as Software engineer.  Safety timeout was performed. The patient was placed supine and adequate level of sedation was assured.  Flexible bronchoscope was lubricated and inserted via the endotracheal tube.  Airway examination was performed bilaterally to subsegmental level.  Minimal secretions were noted, mucosa appeared normal and no endobronchial lesions were identified.  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  Specimens sent: None  Complications:  No immediate complications were noted.  Hemodynamic parameters and oxygenation remained stable throughout the procedure.  Estimated blood loss:  Less then 5 mL.  Penne Lash, M.D. Pulmonary and [place] Pager: [phone]  01/08/2014, 3:07 PM

## 2014-01-08 NOTE — Procedures (Signed)
Extubation Procedure Note  Patient Details:   Name: Joshua [name] DOB: 21-Sep-1951 MRN: [id number]   Airway Documentation:  Airway 7.5 mm (Active)  Secured at (cm) 23 cm 01/08/2014 11:12 AM  Measured From Lips 01/08/2014 11:12 AM  Secured Location Right 01/08/2014 11:12 AM  Secured By [employer] 01/08/2014 11:12 AM  Tube Holder Repositioned Yes 01/08/2014 11:12 AM  Cuff Pressure (cm H2O) 26 cm H2O 01/07/2014  8:49 PM  Site Condition Dry 01/08/2014  4:43 AM    Evaluation  O2 sats: stable throughout and currently acceptable Complications: No apparent complications Patient did tolerate procedure well. Bilateral Breath Sounds: Diminished Suctioning: Airway Yes  Prior to extubation: pt suctioned orally, via ETT and subglottic tube, positive cuff leak noted.  Post-extubation: pt able to cough and produce sputum, speak, and no stridor noted.  Miquel Dunn 01/08/2014, 5:15 PM

## 2014-01-08 NOTE — Progress Notes (Addendum)
Subjective: Left diabetic foot ulcer, consult ordered yesterday, pt briefly seen by Dr. Rolena Infante  Since admission pt suffered a witnessed seizure, pulmonary compromise, now in acute respiratory failure, intubated in MICU.   U/S + for B/L peroneal DVT, started on heparin   Objective: Vital signs in last 24 hours: Temp:  [96.5 F (35.8 C)-98.9 F (37.2 C)] 98.9 F (37.2 C) (02/14 0715) Pulse Rate:  [56-97] 73 (02/14 0807) Resp:  [0-26] 17 (02/14 0807) BP: (67-164)/(45-128) 116/66 mmHg (02/14 0807) SpO2:  [87 %-100 %] 100 % (02/14 0807) FiO2 (%):  [40 %] 40 % (02/14 0807) Weight:  [71.9 kg (158 lb 8.2 oz)] 71.9 kg (158 lb 8.2 oz) (02/14 0600)  Intake/Output from previous day: 02/13 0701 - 02/14 0700 In: 6400.6 [P.O.:380; I.V.:4060.6; IV Piggyback:1960] Out: 1230 [Urine:1230] Intake/Output this shift:     Recent Labs  01/05/14 1616 01/07/14 0606 01/07/14 1511 01/08/14 0400  HGB 9.3* 8.2* 8.2* 9.2*    Recent Labs  01/07/14 1511 01/08/14 0400  WBC 10.8* 15.5*  RBC 3.39* 3.85*  HCT 25.2* 28.2*  PLT 329 433*    Recent Labs  01/07/14 1511 01/08/14 0400  NA 133* 138  K 4.4 3.8  CL 100 104  CO2 18* 20  BUN 42* 37*  CREATININE 1.81* 1.68*  GLUCOSE 191* 126*  CALCIUM 7.4* 7.7*   No results found for this basename: LABPT, INR,  in the last 72 hours  left foot ulcer unchanged from pictures included in Dr. Lucianne Lei Dam's note Plantar aspect L 1st MTP, deep ulcer, nonerythematous, no active drainage or purulence, no fluctuance or evidence of abscess  Assessment/Plan: Pt currently in MICU, intubated, acute respiratory failure following seizure Sepsis. ID following to continue abx Heparin started for B/L LE DVT Will follow patient and continue to monitor ulcer. No evidence of abscess. MRI pending to r/o osteo Will discuss with Dr. Mliss Fritz, Conley Rolls. 01/08/2014, 9:19 AM

## 2014-01-08 NOTE — Progress Notes (Signed)
ANTICOAGULATION CONSULT NOTE - Follow Up Consult  Pharmacy Consult for heparin Indication: DVT  Labs:  Recent Labs  01/07/14 0606 01/07/14 1511  01/07/14 1830 01/08/14 0102 01/08/14 0400 01/08/14 0620 01/08/14 1207 01/08/14 2007  HGB 8.2* 8.2*  --   --   --  9.2*  --   --   --   HCT 24.6* 25.2*  --   --   --  28.2*  --   --   --   PLT 352 329  --   --   --  433*  --   --   --   HEPARINUNFRC  --   --   --   --   --  0.36  --  0.21* 0.52  CREATININE 1.75* 1.81*  --   --   --  1.68*  --   --   --   TROPONINI  --   --   < > <0.30 <0.30  --  <0.30  --   --   < > = values in this interval not displayed.   Assessment: 63yo male therapeutic on heparin for DVT on heparin at 1350 units/hr and heparin level after rate increase is at goal (HL= 0.52).  Goal of Therapy:  Heparin level 0.3-0.7 units/ml   Plan:  -No heparin changes needed -Daily heparin level and CBC  Hildred Laser, Pharm D 01/08/2014 9:09 PM

## 2014-01-08 NOTE — Progress Notes (Signed)
ANTICOAGULATION CONSULT NOTE - Follow Up Consult  Pharmacy Consult for heparin Indication: DVT  Labs:  Recent Labs  01/05/14 1616 01/07/14 0606 01/07/14 1511 01/07/14 1515 01/07/14 1830 01/08/14 0102 01/08/14 0400  HGB 9.3* 8.2* 8.2*  --   --   --   --   HCT 28.8* 24.6* 25.2*  --   --   --   --   PLT 439* 352 329  --   --   --   --   HEPARINUNFRC  --   --   --   --   --   --  0.36  CREATININE 1.14 1.75* 1.81*  --   --   --   --   TROPONINI  --   --   --  <0.30 <0.30 <0.30  --     Assessment: 63yo male therapeutic on heparin with initial dosing for DVT though at lower end of goal and suspect level will fall some after bolus last pm.  Goal of Therapy:  Heparin level 0.3-0.7 units/ml   Plan:  Will increase heparin gtt slightly to 1200 units/hr and check level in [place], PharmD, BCPS  01/08/2014,4:44 AM

## 2014-01-08 NOTE — Progress Notes (Signed)
ANTICOAGULATION CONSULT NOTE - Follow Up Consult  Pharmacy Consult for heparin Indication: DVT  Labs:  Recent Labs  01/07/14 0606 01/07/14 1511  01/07/14 1830 01/08/14 0102 01/08/14 0400 01/08/14 0620 01/08/14 1207  HGB 8.2* 8.2*  --   --   --  9.2*  --   --   HCT 24.6* 25.2*  --   --   --  28.2*  --   --   PLT 352 329  --   --   --  433*  --   --   HEPARINUNFRC  --   --   --   --   --  0.36  --  0.21*  CREATININE 1.75* 1.81*  --   --   --  1.68*  --   --   TROPONINI  --   --   < > <0.30 <0.30  --  <0.30  --   < > = values in this interval not displayed.   Assessment: 63yo male therapeutic on heparin for DVT on heparin at 1200 units/hr and heparin level after rate increase is below goal at 0.21.  Goal of Therapy:  Heparin level 0.3-0.7 units/ml   Plan:  -Heparin bolus 1000 units then increase to 1350 units/hr -Heparin level in 6 hours and daily wth CBC daily  Hildred Laser, Pharm D 01/08/2014 2:21 PM

## 2014-01-08 NOTE — Progress Notes (Signed)
  Echocardiogram 2D Echocardiogram has been performed.  Joshua [name] 01/08/2014, 9:02 AM

## 2014-01-08 NOTE — Consult Note (Signed)
PULMONARY / CRITICAL CARE MEDICINE   Name: Joshua [name] MRN: [id number] DOB: 05-26-1951    ADMISSION DATE:  01/06/2014 CONSULTATION DATE:  2/13  REFERRING MD :  IMTS PRIMARY SERVICE: PCCM  BRIEF PATIENT DESCRIPTION: 63 yo [place] man with Htn, DM2, previous treatment for extrapulmonary TB admitted 2/12 to IMTS with L foot ulcer with increasing pain, chills for treatment of infected DM foot ulcer/possible osteomyelitis.. On evening of 2/13 developed AMS, hypotension and bradycardia after dose of verapamil. Transferred to ICU where he suffered witnessed seizure. Underwent intubation for stabilization and to allow for diagnostic eval (CT chest, CT head).   SIGNIFICANT EVENTS / STUDIES:  2/12  Admitted to IMTS with dx of infected diabetic foot ulcer, R/O osteromyelitis 2/13  Transferred to ICU with AMS, hypotension, bradycardia after verapamil 2/13  Witnessed seizure after transfer to ICU 2/13  LE venous duplex: positive for bilat peroneal DVTs 2/13  CT head >>> nad 2/13  CT chest >>> Bilateral effusions, mild edema, "cut off sign" RLL bronchus, non-specific adenopathy  LINES / TUBES: OETT 2/13 >>> OGT 2/13 >>> R [place] CVL 2/13 >>  Foley 2/13 >>>  CULTURES: Blood 2/13 >>>   ANTIBIOTICS: Vanc 2/12 >>> 2/13 Linezolid 2/13 >>>  Pip-tazo 2/13 >>>   INTERVAL HISTORY:  Heparin gtt started last night.  Decreased vasopressor requirements.  VITAL SIGNS: Temp:  [96.5 F (35.8 C)-98.9 F (37.2 C)] 98.9 F (37.2 C) (02/14 0715) Pulse Rate:  [56-97] 82 (02/14 0715) Resp:  [0-26] 0 (02/14 0715) BP: (67-164)/(45-128) 164/88 mmHg (02/14 0715) SpO2:  [87 %-100 %] 100 % (02/14 0715) FiO2 (%):  [40 %] 40 % (02/14 0443) Weight:  [71.9 kg (158 lb 8.2 oz)] 71.9 kg (158 lb 8.2 oz) (02/14 0600)  HEMODYNAMICS: CVP:  [8 mmHg-19 mmHg] 12 mmHg  VENTILATOR SETTINGS: Vent Mode:  [-] PRVC FiO2 (%):  [40 %] 40 % Set Rate:  [16 bmp] 16 bmp Vt Set:  [500 mL] 500 mL PEEP:  [5 cmH20] 5  cmH20 Plateau Pressure:  [20 cmH20-23 cmH20] 20 cmH20  INTAKE / OUTPUT: Intake/Output     02/13 0701 - 02/14 0700 02/14 0701 - 02/15 0700   P.O. 380    I.V. (mL/kg) 4060.6 (56.5)    IV Piggyback 1960    Total Intake(mL/kg) 6400.6 (89)    Urine (mL/kg/hr) 1230 (0.7)    Total Output 1230     Net +5170.6          Urine Occurrence 1 x    Stool Occurrence 1 x      PHYSICAL EXAMINATION: General:  Mechanically ventilated, no distress Neuro:  Follows commands HEENT: NCAT, EOMI, PERRL Neck: JVD noted Cardiovascular: Regular, no murmurs Lungs: Bilateral air entry, few rales Abdomen: Soft, bowel sounds present Ext: mild L>R pedal edema, ulcer on plantar surface at base of L great toe  LABS: CBC  Recent Labs Lab 01/07/14 0606 01/07/14 1511 01/08/14 0400  WBC 13.4* 10.8* 15.5*  HGB 8.2* 8.2* 9.2*  HCT 24.6* 25.2* 28.2*  PLT 352 329 433*   Coag's No results found for this basename: APTT, INR,  in the last 168 hours  BMET  Recent Labs Lab 01/07/14 0606 01/07/14 1511 01/08/14 0400  NA 138 133* 138  K 3.9 4.4 3.8  CL 102 100 104  CO2 22 18* 20  BUN 39* 42* 37*  CREATININE 1.75* 1.81* 1.68*  GLUCOSE 138* 191* 126*   Electrolytes  Recent Labs Lab 01/07/14 0606 01/07/14 1511 01/08/14 0400  CALCIUM 7.9* 7.4* 7.7*   Sepsis Markers  Recent Labs Lab 01/07/14 1516 01/07/14 1628  LATICACIDVEN 2.0  --   PROCALCITON  --  1.04   ABG  Recent Labs Lab 01/07/14 1539 01/07/14 1921  PHART 7.417 7.302*  PCO2ART 31.9* 38.9  PO2ART 247.0* 298.0*   Liver Enzymes  Recent Labs Lab 01/05/14 1616 01/07/14 1511 01/08/14 0400  AST 32 43* 158*  ALT 33 32 128*  ALKPHOS 179* 177* 223*  BILITOT 0.3 0.4 0.4  ALBUMIN 2.0* 1.3* 1.7*   Cardiac Enzymes  Recent Labs Lab 01/07/14 1830 01/08/14 0102 01/08/14 0620  TROPONINI <0.30 <0.30 <0.30   Glucose  Recent Labs Lab 01/07/14 1132 01/07/14 1546 01/07/14 1708 01/07/14 1904 01/07/14 2355 01/08/14 0422   GLUCAP 136* 235* 185* 260* 208* 121*    CXR: 2/14 >>> No overt airspace disease, hardware in good position  ASSESSMENT / PLAN:  PULMONARY A:  Acute respiratory failure Suspected pulmonary embolism "Cut off sign" RLL bronchus P:   Wean to extubate Bronchoscopy prior to extubation r/o endobronchial lesion Creatinine prohibitive of CTA Empirical anticoagulation given DVT   CARDIOVASCULAR A:  Hypotension, multifactorial ( in setting of infection, PE, antihypertensives ) Bilateral LE DVTs Concern for constrictive pericarditis given h/o TB P:  Goal MAP > 65 Dopamine to off TTE ASA  RENAL A:   AKI CKD P:   Trend BMP NS@100   GASTROINTESTINAL A:   Elevated transaminases in setting of hypotension Abdominal pain Nutrition GI Px P:   Trend LFT Venous Doppler r/o portal / hepatic venin thrombosis RUQ [place] Protonix TF if remains intubated  HEMATOLOGIC A:   Anemia Therapeutic anticoagulation for DVT and suspected PE P:  Trend CBC Heparin gtt per Pharmacy Coumadin when stable  INFECTIOUS A: Infected diabetic foot ulcer Suspected osteo P:   Micro and abx as above ID to continue to follow and direct abx therapy  ENDOCRINE A:   DM2 P:   SSI  NEUROLOGIC A:   Acute encephalopathy New onset seizure P:   D/c sedation after bronchoscopy as expect extubating Keppra EEG  I have personally obtained history, examined patient, evaluated and interpreted laboratory and imaging results, reviewed medical records, formulated assessment / plan and placed orders.  CRITICAL CARE:  The patient is critically ill with multiple organ systems failure and requires high complexity decision making for assessment and support, frequent evaluation and titration of therapies, application of advanced monitoring technologies and extensive interpretation of multiple databases. Critical Care Time devoted to patient care services described in this note is 35 minutes.   Doree Fudge, MD Pulmonary and [place] Pager: [phone]  01/08/2014, 8:46 AM

## 2014-01-09 ENCOUNTER — Inpatient Hospital Stay (HOSPITAL_COMMUNITY): Payer: Self-pay

## 2014-01-09 DIAGNOSIS — J9600 Acute respiratory failure, unspecified whether with hypoxia or hypercapnia: Secondary | ICD-10-CM

## 2014-01-09 LAB — COMPREHENSIVE METABOLIC PANEL
ALT: 94 U/L — ABNORMAL HIGH (ref 0–53)
AST: 66 U/L — ABNORMAL HIGH (ref 0–37)
Albumin: 1.3 g/dL — ABNORMAL LOW (ref 3.5–5.2)
Alkaline Phosphatase: 154 U/L — ABNORMAL HIGH (ref 39–117)
BUN: 26 mg/dL — ABNORMAL HIGH (ref 6–23)
CO2: 23 meq/L (ref 19–32)
CREATININE: 1.52 mg/dL — AB (ref 0.50–1.35)
Calcium: 7.7 mg/dL — ABNORMAL LOW (ref 8.4–10.5)
Chloride: 107 mEq/L (ref 96–112)
GFR calc Af Amer: 55 mL/min — ABNORMAL LOW (ref >90)
GFR, EST NON AFRICAN AMERICAN: 47 mL/min — AB (ref >90)
GLUCOSE: 80 mg/dL (ref 70–99)
Potassium: 3.6 mEq/L — ABNORMAL LOW (ref 3.7–5.3)
Sodium: 141 mEq/L (ref 137–147)
TOTAL PROTEIN: 5.9 g/dL — AB (ref 6.0–8.3)
Total Bilirubin: 0.2 mg/dL — ABNORMAL LOW (ref 0.3–1.2)

## 2014-01-09 LAB — CBC
HCT: 24.5 % — ABNORMAL LOW (ref 39.0–52.0)
HEMOGLOBIN: 7.9 g/dL — AB (ref 13.0–17.0)
MCH: 23.8 pg — ABNORMAL LOW (ref 26.0–34.0)
MCHC: 32.2 g/dL (ref 30.0–36.0)
MCV: 73.8 fL — AB (ref 78.0–100.0)
Platelets: 415 10*3/uL — ABNORMAL HIGH (ref 150–400)
RBC: 3.32 MIL/uL — AB (ref 4.22–5.81)
RDW: 17.6 % — ABNORMAL HIGH (ref 11.5–15.5)
WBC: 13.9 10*3/uL — ABNORMAL HIGH (ref 4.0–10.5)

## 2014-01-09 LAB — CULTURE, BLOOD (ROUTINE X 2)

## 2014-01-09 LAB — GLUCOSE, CAPILLARY
GLUCOSE-CAPILLARY: 107 mg/dL — AB (ref 70–99)
GLUCOSE-CAPILLARY: 160 mg/dL — AB (ref 70–99)
GLUCOSE-CAPILLARY: 195 mg/dL — AB (ref 70–99)
GLUCOSE-CAPILLARY: 87 mg/dL (ref 70–99)
Glucose-Capillary: 73 mg/dL (ref 70–99)
Glucose-Capillary: 133 mg/dL — ABNORMAL HIGH (ref 70–99)

## 2014-01-09 LAB — HEPARIN LEVEL (UNFRACTIONATED): Heparin Unfractionated: 0.43 IU/mL (ref 0.30–0.70)

## 2014-01-09 LAB — PROCALCITONIN: Procalcitonin: 1.44 ng/mL

## 2014-01-09 MED ORDER — LABETALOL HCL 5 MG/ML IV SOLN
10.0000 mg | Freq: Once | INTRAVENOUS | Status: AC
  Administered 2014-01-09: 10 mg via INTRAVENOUS
  Filled 2014-01-09: qty 4

## 2014-01-09 MED ORDER — POTASSIUM CHLORIDE 10 MEQ/50ML IV SOLN
10.0000 meq | INTRAVENOUS | Status: AC
  Administered 2014-01-09 (×2): 10 meq via INTRAVENOUS
  Filled 2014-01-09 (×2): qty 50

## 2014-01-09 MED ORDER — TECHNETIUM TO 99M ALBUMIN AGGREGATED
6.0000 | Freq: Once | INTRAVENOUS | Status: AC | PRN
  Administered 2014-01-09: 6 via INTRAVENOUS

## 2014-01-09 MED ORDER — TECHNETIUM TC 99M DIETHYLENETRIAME-PENTAACETIC ACID
40.0000 | Freq: Once | INTRAVENOUS | Status: AC | PRN
  Administered 2014-01-09: 40 via INTRAVENOUS

## 2014-01-09 MED ORDER — SODIUM CHLORIDE 0.9 % IJ SOLN
10.0000 mL | INTRAMUSCULAR | Status: DC | PRN
Start: 2014-01-09 — End: 2014-01-22
  Administered 2014-01-10 – 2014-01-11 (×2): 10 mL

## 2014-01-09 MED ORDER — VERAPAMIL HCL 80 MG PO TABS
80.0000 mg | ORAL_TABLET | Freq: Two times a day (BID) | ORAL | Status: DC
  Administered 2014-01-09 – 2014-01-10 (×2): 80 mg via ORAL
  Filled 2014-01-09 (×3): qty 1

## 2014-01-09 MED ORDER — LABETALOL HCL 5 MG/ML IV SOLN
5.0000 mg | INTRAVENOUS | Status: DC | PRN
  Administered 2014-01-09: 5 mg via INTRAVENOUS
  Filled 2014-01-09: qty 4

## 2014-01-09 MED ORDER — GADOBENATE DIMEGLUMINE 529 MG/ML IV SOLN
15.0000 mL | Freq: Once | INTRAVENOUS | Status: AC | PRN
  Administered 2014-01-09: 15 mL via INTRAVENOUS

## 2014-01-09 NOTE — Progress Notes (Signed)
eLink Physician-Brief Progress Note Patient Name: TEJUAN CURLER DOB: [birth year] MRN: [id number]  Date of Service  01/09/2014   HPI/Events of Note   Hypertensive with mild SOB. BP markedly elevated.  eICU Interventions   Started labetalol PRN   Intervention Category Intermediate Interventions: Hypertension - evaluation and management  Smera Guyette R. 01/09/2014, 8:50 PM

## 2014-01-09 NOTE — Progress Notes (Signed)
INTERNAL MEDICINE TEACHING SERVICE Brief transfer note.   Subjective:    Patient transferred to the IMTS today. We assume care tomorrow 01/10/2014. He says that his SOB is better but still laboured.   Objective:    BP 142/97  Pulse 97  Temp(Src) 97.9 F (36.6 C) (Oral)  Resp 24  Ht 5\' 6"  (1.676 m)  Wt 151 lb 1.6 oz (68.539 kg)  BMI 24.40 kg/m2  SpO2 100%   Labs: Basic Metabolic Panel:    Component Value Date/Time   NA 141 01/09/2014 0428   K 3.6* 01/09/2014 0428   CL 107 01/09/2014 0428   CO2 23 01/09/2014 0428   BUN 26* 01/09/2014 0428   CREATININE 1.52* 01/09/2014 0428   CREATININE 1.14 01/05/2014 1616   GLUCOSE 80 01/09/2014 0428   CALCIUM 7.7* 01/09/2014 0428    CBC:    Component Value Date/Time   WBC 13.9* 01/09/2014 0428   HGB 7.9* 01/09/2014 0428   HCT 24.5* 01/09/2014 0428   PLT 415* 01/09/2014 0428   MCV 73.8* 01/09/2014 0428   NEUTROABS 11.1* 01/05/2014 1616   LYMPHSABS 1.5 01/05/2014 1616   MONOABS 1.5* 01/05/2014 1616   EOSABS 0.1 01/05/2014 1616   BASOSABS 0.0 01/05/2014 1616    Cardiac Enzymes: Lab Results  Component Value Date   TROPONINI <0.30 01/08/2014    Physical Exam: General: Vital signs reviewed and noted. Mild distress with nasal canular O2.  in no acute distress; alert, appropriate and cooperative throughout examination.  Lungs:  Mild increased work of breathing.   Heart: RRR. S1 and S2 normal without gallop, murmur, or rubs.  Abdomen:  BS normoactive. Soft, Nondistended, non-tender.  No masses or organomegaly.  Extremities: No pretibial edema.     Assessment/ Plan:    Noted his BP to be elevated. I will add back his home Verapamil at a lower dose of 80 mg bid  Keep on nasal oxygen Cont with antibiotics  IMTS will assume care tomorrow.   Jessee Avers, MD  01/09/2014, 4:22 PM

## 2014-01-09 NOTE — Progress Notes (Signed)
PULMONARY / CRITICAL CARE MEDICINE   Name: Joshua [name] MRN: [id number] DOB: Sep 27, 1951    ADMISSION DATE:  01/06/2014 CONSULTATION DATE:  2/13  REFERRING MD :  IMTS PRIMARY SERVICE: PCCM  BRIEF PATIENT DESCRIPTION: 63 yo [place] man with Htn, DM2, previous treatment for extrapulmonary TB admitted 2/12 to IMTS with L foot ulcer with increasing pain, chills for treatment of infected DM foot ulcer/possible osteomyelitis.. On evening of 2/13 developed AMS, hypotension and bradycardia after dose of verapamil. Transferred to ICU where he suffered witnessed seizure. Underwent intubation for stabilization and to allow for diagnostic eval (CT chest, CT head).   SIGNIFICANT EVENTS / STUDIES:  2/12  Admitted to IMTS with dx of infected diabetic foot ulcer, R/O osteromyelitis 2/13  Transferred to ICU with AMS, hypotension, bradycardia after verapamil 2/13  Witnessed seizure after transfer to ICU 2/13  LE venous duplex: positive for bilat peroneal DVTs 2/13  CT head >>> nad 2/13  CT chest >>> Bilateral effusions, mild edema, "cut off sign" RLL bronchus, non-specific adenopathy 2/14  Bronchoscopy >>> no endobronchial lesions, severe tracheobronchomalacia 2/14  Venous Doppler LE >>> Bilateral acute DVT 2/14  RUQ [place] >>> Findings are worrisome for acute acalculous cholecystitis 2/14  Liver Doppler >>> no evidence of portal vein thrombosis or hepatic veno-occlusive disease 2/14  TTE >>> EF 55, grade 2 diastolic dysfunction, mildly dilated RV with moderately reduced function, PAP 49 torr   LINES / TUBES: OETT 2/13 >>> 2/14 OGT 2/13 >>> 2/14 R [place] CVL 2/13 >>  Foley 2/13 >>> 2/14  CULTURES: Blood 2/13 >>>   ANTIBIOTICS: Vanc 2/12 >>> 2/13 Linezolid 2/13 >>>  Pip-tazo 2/13 >>>   INTERVAL HISTORY:  Extubated.  Off vasopressors.  Hungry. Hb drop overnight.  VITAL SIGNS: Temp:  [98 F (36.7 C)-99.7 F (37.6 C)] 99.7 F (37.6 C) (02/15 0600) Pulse Rate:  [68-92] 92 (02/15 0600) Resp:   [0-27] 21 (02/15 0600) BP: (115-164)/(65-92) 160/86 mmHg (02/15 0600) SpO2:  [93 %-100 %] 100 % (02/15 0600) FiO2 (%):  [40 %] 40 % (02/14 1112)  HEMODYNAMICS: CVP:  [11 mmHg-20 mmHg] 20 mmHg  VENTILATOR SETTINGS: Vent Mode:  [-] PRVC FiO2 (%):  [40 %] 40 % Set Rate:  [16 bmp] 16 bmp Vt Set:  [500 mL] 500 mL PEEP:  [5 cmH20] 5 cmH20 Pressure Support:  [5 cmH20] 5 cmH20 Plateau Pressure:  [20 cmH20] 20 cmH20  INTAKE / OUTPUT: Intake/Output     02/14 0701 - 02/15 0700 02/15 0701 - 02/16 0700   P.O.     I.V. (mL/kg) 2600 (36.2)    IV Piggyback 787.5    Total Intake(mL/kg) 3387.5 (47.1)    Urine (mL/kg/hr) 2125 (1.2)    Total Output 2125     Net +1262.5            PHYSICAL EXAMINATION: General:  Resting in the char.  No distress. Neuro:  Follows commands HEENT: NCAT, EOMI, PERRL Neck: No JVD Cardiovascular: Regular, no murmurs Lungs: Bilateral air entry Abdomen: Soft, bowel sounds present, mild RUQ tenderness Ext: L>R pedal edema, ulcer on plantar surface at base of L great toe  LABS: CBC  Recent Labs Lab 01/07/14 1511 01/08/14 0400 01/09/14 0428  WBC 10.8* 15.5* 13.9*  HGB 8.2* 9.2* 7.9*  HCT 25.2* 28.2* 24.5*  PLT 329 433* 415*   Coag's No results found for this basename: APTT, INR,  in the last 168 hours  BMET  Recent Labs Lab 01/07/14 1511 01/08/14 0400 01/09/14 0428  NA 133* 138 141  K 4.4 3.8 3.6*  CL 100 104 107  CO2 18* 20 23  BUN 42* 37* 26*  CREATININE 1.81* 1.68* 1.52*  GLUCOSE 191* 126* 80   Electrolytes  Recent Labs Lab 01/07/14 1511 01/08/14 0400 01/09/14 0428  CALCIUM 7.4* 7.7* 7.7*   Sepsis Markers  Recent Labs Lab 01/07/14 1516 01/07/14 1628 01/09/14 0428  LATICACIDVEN 2.0  --   --   PROCALCITON  --  1.04 1.44   ABG  Recent Labs Lab 01/07/14 1539 01/07/14 1921  PHART 7.417 7.302*  PCO2ART 31.9* 38.9  PO2ART 247.0* 298.0*   Liver Enzymes  Recent Labs Lab 01/07/14 1511 01/08/14 0400 01/09/14 0428   AST 43* 158* 66*  ALT 32 128* 94*  ALKPHOS 177* 223* 154*  BILITOT 0.4 0.4 0.2*  ALBUMIN 1.3* 1.7* 1.3*   Cardiac Enzymes  Recent Labs Lab 01/07/14 1830 01/08/14 0102 01/08/14 0620  TROPONINI <0.30 <0.30 <0.30   Glucose  Recent Labs Lab 01/08/14 0422 01/08/14 0702 01/08/14 1458 01/08/14 1926 01/08/14 2337 01/09/14 0327  GLUCAP 121* 108* 95 146* 87 73    CXR: None today  ASSESSMENT / PLAN:  PULMONARY A:  Acute respiratory failure secondary to acute PE, resolved High suspicion for pulmonary embolism Severe tracheobronchomalacia P:   Goal SpO2>92 Supplemental oxygen PRN Creatinine prohibitive of CTA Empirical anticoagulation given DVT  VQ scan ordered  CARDIOVASCULAR A:  Hypotension in setting of acute PE RV failure Chronic diastolic heart falure Bilateral LE DVTs P:  Goal MAP > 65 Hold ASA as acute Hb drop  RENAL A:   AKI, Cr improving CKD P:   Trend BMP NS@100  K 10 x 2  GASTROINTESTINAL A:   Elevated transaminases in setting of hypotension Suspected acalculous cholecystitis Nutrition GI Px is not indictaed P:   Trend LFT D/c Protonix Diet after VQ completed HIDA scan at some point  HEMATOLOGIC A:   Anemia Therapeutic anticoagulation for DVT and suspected PE P:  Trend CBC Heparin gtt per Pharmacy Coumadin when stable  INFECTIOUS A: Infected diabetic foot ulcer Suspected osteo P:   Micro and abx as above ID to continue to follow and direct abx therapy MRI at some point  ENDOCRINE A:   DM2 P:   SSI  NEUROLOGIC A:   Acute encephalopathy, resolved New onset seizure P:   D/c fentanyl Keppra Follow EEG results  Downgrade to telemetry.  IMTS to assume care 2/16.  PCCM will sign off.  I have personally obtained history, examined patient, evaluated and interpreted laboratory and imaging results, reviewed medical records, formulated assessment / plan and placed orders.  CRITICAL CARE:  The patient is critically ill  with multiple organ systems failure and requires high complexity decision making for assessment and support, frequent evaluation and titration of therapies, application of advanced monitoring technologies and extensive interpretation of multiple databases. Critical Care Time devoted to patient care services described in this note is 35 minutes.   Doree Fudge, MD Pulmonary and [place] Pager: [phone]  01/09/2014, 7:27 AM

## 2014-01-09 NOTE — Progress Notes (Signed)
Patient ID: Joshua [name], male   DOB: 09/12/1951, 63 y.o.   MRN: [id number]  Medical concerns and active issues preclude any surgical intervention (if needed) at this point Ruling out PE  Dr. Rolena Infante to see in am to determine next course pending further workup ie MRI

## 2014-01-09 NOTE — Progress Notes (Signed)
Patient transported to Nuclear Med for VQ Scan , RN on 3 W made aware. Patient's belongings delivered into room 3W room 15.

## 2014-01-09 NOTE — Progress Notes (Signed)
Patient's BP 210/102.  MD notified.  Awaiting orders.  Will continue to monitor. [place], Ardeth Sportsman

## 2014-01-09 NOTE — Progress Notes (Signed)
ANTICOAGULATION CONSULT NOTE - Follow Up Consult  Pharmacy Consult for heparin Indication: DVT; r/o PE  Labs:  Recent Labs  01/07/14 1511  01/07/14 1830 01/08/14 0102  01/08/14 0400 01/08/14 0620 01/08/14 1207 01/08/14 2007 01/09/14 0428  HGB 8.2*  --   --   --   --  9.2*  --   --   --  7.9*  HCT 25.2*  --   --   --   --  28.2*  --   --   --  24.5*  PLT 329  --   --   --   --  433*  --   --   --  415*  HEPARINUNFRC  --   --   --   --   < > 0.36  --  0.21* 0.52 0.43  CREATININE 1.81*  --   --   --   --  1.68*  --   --   --  1.52*  TROPONINI  --   < > <0.30 <0.30  --   --  <0.30  --   --   --   < > = values in this interval not displayed.   Assessment: 63yo male on heparin for DVT.  Heparin level is therapeutic on heparin at 1350 units/hr.  Will follow-up results from VQ scan.  Goal of Therapy:  Heparin level 0.3-0.7 units/ml   Plan:  -Continue heparin at 1350 units/hr -Daily heparin level and CBC -Follow-up plans for long term anticoagulation (Coumadin vs Xarelto)  Masaichi Kracht, Pharm.D., BCPS Clinical Pharmacist Pager [phone] 01/09/2014 1:04 PM

## 2014-01-09 NOTE — Progress Notes (Signed)
[facility] ADULT ICU REPLACEMENT PROTOCOL FOR AM LAB REPLACEMENT ONLY  The patient does apply for the [facility] Iv Adult ICU Electrolyte Replacment Protocol based on the criteria listed below:   1. Is GFR >/= 40 ml/min? yes  Patient's GFR today is 55 2. Is urine output >/= 0.5 ml/kg/hr for the last 6 hours? yes Patient's UOP is 1.6 ml/kg/hr 3. Is BUN < 60 mg/dL? yes  Patient's BUN today is 26 4. Abnormal electrolyte(s): K+3.6 5. Ordered repletion with: protocol 6. If a panic level lab has been reported, has the CCM MD in charge been notified? yes.   Physician: Dr Levin Bacon, Eddie Dibbles Hilliard 01/09/2014 5:30 AM

## 2014-01-10 DIAGNOSIS — R6521 Severe sepsis with septic shock: Secondary | ICD-10-CM

## 2014-01-10 DIAGNOSIS — A419 Sepsis, unspecified organism: Secondary | ICD-10-CM

## 2014-01-10 DIAGNOSIS — E1169 Type 2 diabetes mellitus with other specified complication: Secondary | ICD-10-CM

## 2014-01-10 DIAGNOSIS — I82409 Acute embolism and thrombosis of unspecified deep veins of unspecified lower extremity: Secondary | ICD-10-CM

## 2014-01-10 DIAGNOSIS — I509 Heart failure, unspecified: Secondary | ICD-10-CM

## 2014-01-10 DIAGNOSIS — D509 Iron deficiency anemia, unspecified: Secondary | ICD-10-CM

## 2014-01-10 DIAGNOSIS — I5030 Unspecified diastolic (congestive) heart failure: Secondary | ICD-10-CM

## 2014-01-10 DIAGNOSIS — L97409 Non-pressure chronic ulcer of unspecified heel and midfoot with unspecified severity: Secondary | ICD-10-CM

## 2014-01-10 DIAGNOSIS — R6520 Severe sepsis without septic shock: Secondary | ICD-10-CM

## 2014-01-10 LAB — BASIC METABOLIC PANEL
BUN: 22 mg/dL (ref 6–23)
CO2: 22 mEq/L (ref 19–32)
Calcium: 8.1 mg/dL — ABNORMAL LOW (ref 8.4–10.5)
Chloride: 105 mEq/L (ref 96–112)
Creatinine, Ser: 1.53 mg/dL — ABNORMAL HIGH (ref 0.50–1.35)
GFR calc Af Amer: 55 mL/min — ABNORMAL LOW (ref >90)
GFR, EST NON AFRICAN AMERICAN: 47 mL/min — AB (ref >90)
Glucose, Bld: 154 mg/dL — ABNORMAL HIGH (ref 70–99)
Potassium: 3.7 mEq/L (ref 3.7–5.3)
SODIUM: 139 meq/L (ref 137–147)

## 2014-01-10 LAB — GLUCOSE, CAPILLARY
GLUCOSE-CAPILLARY: 168 mg/dL — AB (ref 70–99)
GLUCOSE-CAPILLARY: 115 mg/dL — AB (ref 70–99)
Glucose-Capillary: 78 mg/dL (ref 70–99)
Glucose-Capillary: 145 mg/dL — ABNORMAL HIGH (ref 70–99)
Glucose-Capillary: 145 mg/dL — ABNORMAL HIGH (ref 70–99)
Glucose-Capillary: 152 mg/dL — ABNORMAL HIGH (ref 70–99)
Glucose-Capillary: 169 mg/dL — ABNORMAL HIGH (ref 70–99)

## 2014-01-10 LAB — PROTIME-INR
INR: 1.08 (ref 0.00–1.49)
Prothrombin Time: 13.8 seconds (ref 11.6–15.2)

## 2014-01-10 LAB — CBC
HCT: 25.7 % — ABNORMAL LOW (ref 39.0–52.0)
Hemoglobin: 8.2 g/dL — ABNORMAL LOW (ref 13.0–17.0)
MCH: 23.6 pg — ABNORMAL LOW (ref 26.0–34.0)
MCHC: 31.9 g/dL (ref 30.0–36.0)
MCV: 73.9 fL — ABNORMAL LOW (ref 78.0–100.0)
PLATELETS: 446 10*3/uL — AB (ref 150–400)
RBC: 3.48 MIL/uL — AB (ref 4.22–5.81)
RDW: 17.9 % — ABNORMAL HIGH (ref 11.5–15.5)
WBC: 20.7 10*3/uL — AB (ref 4.0–10.5)

## 2014-01-10 LAB — HEPARIN LEVEL (UNFRACTIONATED)
HEPARIN UNFRACTIONATED: 0.15 [IU]/mL — AB (ref 0.30–0.70)
Heparin Unfractionated: 0.26 IU/mL — ABNORMAL LOW (ref 0.30–0.70)

## 2014-01-10 MED ORDER — AMLODIPINE BESYLATE 10 MG PO TABS: 10.0000 mg | ORAL_TABLET | Freq: Every day | ORAL | Status: DC

## 2014-01-10 MED ORDER — WARFARIN SODIUM 7.5 MG PO TABS
7.5000 mg | ORAL_TABLET | Freq: Once | ORAL | Status: AC
  Administered 2014-01-10: 7.5 mg via ORAL
  Filled 2014-01-10: qty 1

## 2014-01-10 MED ORDER — WARFARIN VIDEO
Freq: Once | Status: AC
  Administered 2014-01-11: 11:00:00

## 2014-01-10 MED ORDER — VERAPAMIL HCL 120 MG PO TABS
120.0000 mg | ORAL_TABLET | Freq: Every day | ORAL | Status: DC
  Administered 2014-01-11: 120 mg via ORAL
  Filled 2014-01-10 (×2): qty 1

## 2014-01-10 MED ORDER — FUROSEMIDE 40 MG PO TABS
40.0000 mg | ORAL_TABLET | Freq: Every day | ORAL | Status: DC
  Administered 2014-01-10 – 2014-01-12 (×3): 40 mg via ORAL
  Filled 2014-01-10 (×3): qty 1

## 2014-01-10 MED ORDER — INSULIN ASPART 100 UNIT/ML ~~LOC~~ SOLN
0.0000 [IU] | Freq: Three times a day (TID) | SUBCUTANEOUS | Status: DC
  Administered 2014-01-10: 1 [IU] via SUBCUTANEOUS
  Administered 2014-01-10 – 2014-01-11 (×3): 2 [IU] via SUBCUTANEOUS
  Administered 2014-01-11 – 2014-01-13 (×4): 1 [IU] via SUBCUTANEOUS
  Administered 2014-01-13: 2 [IU] via SUBCUTANEOUS
  Administered 2014-01-14: 1 [IU] via SUBCUTANEOUS
  Administered 2014-01-14: 3 [IU] via SUBCUTANEOUS
  Administered 2014-01-14 – 2014-01-15 (×2): 2 [IU] via SUBCUTANEOUS
  Administered 2014-01-15: 1 [IU] via SUBCUTANEOUS
  Administered 2014-01-16 (×2): 3 [IU] via SUBCUTANEOUS
  Administered 2014-01-17 – 2014-01-20 (×7): 2 [IU] via SUBCUTANEOUS
  Administered 2014-01-20: 3 [IU] via SUBCUTANEOUS
  Administered 2014-01-20 – 2014-01-21 (×2): 2 [IU] via SUBCUTANEOUS
  Administered 2014-01-21: 3 [IU] via SUBCUTANEOUS

## 2014-01-10 MED ORDER — AMOXICILLIN-POT CLAVULANATE 875-125 MG PO TABS
1.0000 | ORAL_TABLET | Freq: Two times a day (BID) | ORAL | Status: DC
  Administered 2014-01-10: 1 via ORAL
  Filled 2014-01-10 (×3): qty 1

## 2014-01-10 MED ORDER — CARVEDILOL 6.25 MG PO TABS
6.2500 mg | ORAL_TABLET | Freq: Two times a day (BID) | ORAL | Status: DC
  Administered 2014-01-10 – 2014-01-16 (×12): 6.25 mg via ORAL
  Filled 2014-01-10 (×17): qty 1

## 2014-01-10 MED ORDER — FUROSEMIDE 10 MG/ML IJ SOLN
40.0000 mg | Freq: Once | INTRAMUSCULAR | Status: AC
  Administered 2014-01-10: 40 mg via INTRAVENOUS

## 2014-01-10 MED ORDER — FUROSEMIDE 10 MG/ML IJ SOLN
INTRAMUSCULAR | Status: AC
  Filled 2014-01-10: qty 4

## 2014-01-10 MED ORDER — COUMADIN BOOK
Freq: Once | Status: AC
  Administered 2014-01-11: 11:00:00
  Filled 2014-01-10: qty 1

## 2014-01-10 MED ORDER — HEPARIN BOLUS VIA INFUSION
2000.0000 [IU] | Freq: Once | INTRAVENOUS | Status: AC
  Administered 2014-01-10: 2000 [IU] via INTRAVENOUS
  Filled 2014-01-10: qty 2000

## 2014-01-10 MED ORDER — WARFARIN - PHARMACIST DOSING INPATIENT: Freq: Every day | Status: DC

## 2014-01-10 MED ORDER — PANTOPRAZOLE SODIUM 20 MG PO TBEC
20.0000 mg | DELAYED_RELEASE_TABLET | Freq: Every day | ORAL | Status: DC
  Administered 2014-01-11 – 2014-01-21 (×11): 20 mg via ORAL
  Filled 2014-01-10 (×14): qty 1

## 2014-01-10 NOTE — Progress Notes (Signed)
Pt very labored with his breathing with activity. Sats 91% on 2L [employer]. Lungs diminished with crackles in RLL. O2 increased to 3L  with sats up to 94%. Pt appears uncomfortable and restless. Dr. Presley Raddle up to see pt. Orders for lasix IV. Will medicate and cont to monitor closely.

## 2014-01-10 NOTE — Progress Notes (Addendum)
ANTICOAGULATION CONSULT NOTE - Follow Up Consult  Pharmacy Consult for heparin Indication: DVT  Heparin dosing weight: 68.4 kg  Labs:  Recent Labs  01/07/14 1830 01/08/14 0102  01/08/14 0400 01/08/14 0620  01/09/14 0428 01/10/14 0440 01/10/14 1440  HGB  --   --   < > 9.2*  --   --  7.9* 8.2*  --   HCT  --   --   --  28.2*  --   --  24.5* 25.7*  --   PLT  --   --   --  433*  --   --  415* 446*  --   HEPARINUNFRC  --   --   --  0.36  --   < > 0.43 0.15* 0.26*  CREATININE  --   --   --  1.68*  --   --  1.52* 1.53*  --   TROPONINI <0.30 <0.30  --   --  <0.30  --   --   --   --   < > = values in this interval not displayed.   Assessment: 63yo male remains subtherapeutic (0.26) on heparin at 1550 units/hr for LLE DVT.   H/H 8.2/25.7, Platelets 446. No bleeding reported.  Noted that VQ reveals very low probability of PE. Per RN- no issues with the infusion.   Goal of Therapy:  Heparin level 0.3-0.7 units/ml   Plan:  Will increase drip to 1750 units/hr and repeat level in 8hr to confirm.   Sloan Leiter, PharmD, BCPS Clinical Pharmacist [phone] 01/10/2014,4:16 PM   Addedum Initiating warfarin for DVT, continuing with heparin bridge Ordered baseline INR and daily INR Current hgb 8.2, plts 446 stable  Goal: INR 2-3  Plan: Warfarin 7.5 mg po x1 tonight Will need warfarin education  Daily INR  Hughes Better, PharmD, BCPS Clinical Pharmacist 01/10/2014 6:42 PM

## 2014-01-10 NOTE — Care Management Note (Signed)
    Page 1 of 1   01/20/2014     12:17:17 PM   CARE MANAGEMENT NOTE 01/20/2014  Patient:  Joshua [name], Joshua [name]   Account Number:  [id number]  Date Initiated:  01/10/2014  Documentation initiated by:  GRAVES-BIGELOW,Kandyce Dieguez  Subjective/Objective Assessment:   Pt admitted for l plantar wound infection- Iv abx. + DVT Iv heparin.     Action/Plan:   Cm will continue to monitor for disposition needs.   Anticipated DC Date:  01/12/2014   Anticipated DC Plan:  [place]  CM consult      Choice offered to / List presented to:             Status of service:  Completed, signed off Medicare Important Message given?   (If response is "NO", the following Medicare IM given date fields will be blank) Date Medicare IM given:   Date Additional Medicare IM given:    Discharge Disposition:  [place]  Per UR Regulation:  Reviewed for med. necessity/level of care/duration of stay  If discussed at [place] of Stay Meetings, dates discussed:   01/11/2014  01/13/2014  01/18/2014  01/20/2014    Comments:   01-20-14 1215 Joshua Krauss, RN,BSN [phone] Pt was discussed in [place] today and plan will be SNF via LOG. CSW is working with pt for disposition needs.  01-17-14 Plan for I&D today. On po abx now. ? if need amputation. Plan cir vs snf when mediallcy stable. Margarita Grizzle [phone]

## 2014-01-10 NOTE — Progress Notes (Signed)
Medical Student Daily Progress Note  Brief patient description: Mr. Janeczek is a 63 yo [place] man with HTN, DM2, previous treatment for extrapulmonary TB admitted 2/12 to IMTS with L foot ulcer with increasing pain and chills for treatment of infected DM foot ulcer/possible osteomyelitis. However, his hospital course was complicated by AMS and acute respiratory failure on evening of 2/13 and was transferred to ICU where he received intubation after he had a witnessed seizure. Multiple studies were done as listed below, and patient was transferred back to IMTS on 2/16.   SIGNIFICANT EVENTS / STUDIES:  2/12 Admitted to IMTS with dx of infected diabetic foot ulcer, R/O osteromyelitis  2/13 Transferred to ICU with AMS, hypotension, bradycardia after verapamil  2/13 Witnessed seizure after transfer to ICU  2/13 LE venous duplex: positive for bilat peroneal DVTs  2/13 CT head >>> nad  2/13 CT chest >>> Bilateral effusions, mild edema, "cut off sign" RLL bronchus, non-specific adenopathy  2/14 Bronchoscopy >>> no endobronchial lesions, severe tracheobronchomalacia  2/14 Venous Doppler LE >>> Bilateral acute DVT  2/14 RUQ [place] >>> Findings are worrisome for acute acalculous cholecystitis (1.1cm gallbladder wall thickening) 2/14 Liver Doppler >>> no evidence of portal vein thrombosis or hepatic veno-occlusive disease  2/14 TTE >>> EF 55, grade 2 diastolic dysfunction, mildly dilated RV with moderately reduced function, PAP 49 torr  2/15 V/Q scan >>> no PE, very low probability for pulmonary embolism 2/15 MRI of Left foot >>> no osteo, diffuse cellulitis, myositis, small 1st MTP joint effusion that is likely aseptic and incidental, osteonecrosis of first MTP joint  LINES / TUBES:  OETT 2/13 >>> 2/14  OGT 2/13 >>> 2/14  R [place] CVL 2/13 >>> 2/16 (planned) Foley 2/13 >>> 2/16 (planned)  CULTURES:  Blood 2/13 >>> 1 out of 2 positive for Staphylococcus species, coagulase negative  ANTIBIOTICS:  Vanc  2/12 >>> 2/13  Linezolid 2/13 >>>2/16  Pip-tazo 2/13 >>> 2/16 Augmentin 2/16>>  Subjective: Patient had one episode of BP 195/106 with a high of 210/102 the day before. It was treated with labetalol and blood pressure returned to 142/76. Patient then experienced SOB and received Furosemide, after which he urinated 775mL and felt much better. Otherwise, patient does not report any abdominal pain and says that he feels better.  Patient reports that he has been having RUQ pain and SOB after meals for about half a year, but he usually "just shake it off." He has not needed to see the doctor for this pain. He has not paid attention to particular pain pattern with food ingestion, but when asked if eating fried chicken would exacerbate it, patient said it would give him RUQ pain. Patient reports that he occasionally have dark stools, and it just started in the past year. Sometimes, the dark stools would correlate to the abdominal pain.   Objective: Vital signs in last 24 hours: Filed Vitals:   01/09/14 2030 01/09/14 2300 01/09/14 2335 01/10/14 0425  BP: 195/106 160/78 184/90 142/76  Pulse:  79  79  Temp:    98.5 F (36.9 C)  TempSrc:      Resp:    20  Height:      Weight:      SpO2:    100%   Weight change:   Intake/Output Summary (Last 24 hours) at 01/10/14 0724 Last data filed at 01/10/14 0602  Gross per 24 hour  Intake   3689 ml  Output   1154 ml  Net   2535 ml  Physical Exam: BP 142/76  Pulse 79  Temp(Src) 98.5 F (36.9 C) (Oral)  Resp 20  Ht 5\' 6"  (1.676 m)  Wt 68.539 kg (151 lb 1.6 oz)  BMI 24.40 kg/m2  SpO2 100% General appearance: alert, cooperative and no distress Neck: JVD - 6 cm above sternal notch and no carotid bruit Lungs: clear to auscultation bilaterally and no egonphony, clear to percussion bilaterally Heart: A third heart sound heard, likely S4 vs S1 split. RRR, no murmurs or rubs Abdomen: Soft, positive Murphy's sign, non distended, no tenderness to deep  palpation of the RUQ. No rebound tenderness, clear to percussion. Extremities: LLE: ulcer in plantar aspect of foot below 1st metatarsal, 2+ edema; RLE: 2+ edema, some scaling of the toes Lab Results: Lab Results  Component Value Date   CREATININE 1.53* 01/10/2014   BUN 22 01/10/2014   NA 139 01/10/2014   K 3.7 01/10/2014   CL 105 01/10/2014   CO2 22 01/10/2014   Lab Results  Component Value Date   CALCIUM 8.1* 01/10/2014   CBC    Component Value Date/Time   WBC 20.7* 01/10/2014 0440   RBC 3.48* 01/10/2014 0440   HGB 8.2* 01/10/2014 0440   HCT 25.7* 01/10/2014 0440   PLT 446* 01/10/2014 0440   MCV 73.9* 01/10/2014 0440   MCH 23.6* 01/10/2014 0440   MCHC 31.9 01/10/2014 0440   RDW 17.9* 01/10/2014 0440   LYMPHSABS 1.5 01/05/2014 1616   MONOABS 1.5* 01/05/2014 1616   EOSABS 0.1 01/05/2014 1616   BASOSABS 0.0 01/05/2014 1616   Heparin level 0.15  Micro Results: Recent Results (from the past 240 hour(s))  CULTURE, BLOOD (ROUTINE X 2)     Status: None   Collection Time    01/07/14  8:15 AM      Result Value Ref Range Status   Specimen Description BLOOD LEFT ARM   Final   Special Requests BOTTLES DRAWN AEROBIC AND ANAEROBIC 10CC   Final   Culture  Setup Time     Final   Value: 01/07/2014 14:02     Performed at [facility]   Culture     Final   Value: STAPHYLOCOCCUS SPECIES (COAGULASE NEGATIVE)     Note: THE SIGNIFICANCE OF ISOLATING THIS ORGANISM FROM A SINGLE SET OF BLOOD CULTURES WHEN MULTIPLE SETS ARE DRAWN IS UNCERTAIN. PLEASE NOTIFY THE MICROBIOLOGY DEPARTMENT WITHIN ONE WEEK IF SPECIATION AND SENSITIVITIES ARE REQUIRED.     Note: Gram Stain Report Called to,Read Back By and Verified With: [facility] 01/08/14 @ 5:56PM BY RUSCOE A.     Performed at [facility]   Report Status 01/09/2014 FINAL   Final  CULTURE, BLOOD (ROUTINE X 2)     Status: None   Collection Time    01/07/14  8:30 AM      Result Value Ref Range Status   Specimen Description BLOOD LEFT ARM    Final   Special Requests BOTTLES DRAWN AEROBIC AND ANAEROBIC 10CC   Final   Culture  Setup Time     Final   Value: 01/07/2014 14:02     Performed at [facility]   Culture     Final   Value:        BLOOD CULTURE RECEIVED NO GROWTH TO DATE CULTURE WILL BE HELD FOR 5 DAYS BEFORE ISSUING A FINAL NEGATIVE REPORT     Performed at [facility]   Report Status PENDING   Incomplete   Studies/Results: Mr Foot Left  W Wo Contrast  01/09/2014   CLINICAL DATA:  Nonhealing wound on the plantar surface of the foot the first MTP joint in the diabetic patient.  EXAM: MRI OF THE LEFT FOREFOOT WITHOUT AND WITH CONTRAST  TECHNIQUE: Multiplanar, multisequence MR imaging was performed both before and after administration of intravenous contrast.  CONTRAST:  15 mL MULTIHANCE GADOBENATE DIMEGLUMINE 529 MG/ML IV SOLN  COMPARISON:  Plain films of the left foot 01/05/2014.  FINDINGS: There is a skin ulceration on the plantar surface the foot at the level the first MTP joint. Extensive subcutaneous edema and enhancement are present about the foot consistent with cellulitis. No bone marrow signal abnormality to suggest osteomyelitis is seen. The medial sesamoid bone demonstrates decreased signal on both T1 and T2 weighted images without postcontrast enhancement likely due to chronic osteonecrosis. All visualized intrinsic musculature of the foot demonstrates increased T2 signal. There is postcontrast enhancement in the flexor hallucis brevis muscle. The patient has a small first MTP joint effusion.  IMPRESSION: Skin ulceration on the plantar surface of the foot at the first MTP joint without underlying abscess or osteomyelitis. Changes of diffuse cellulitis are identified. Edema and enhancement in the flexor hallucis brevis muscle are compatible with myositis.  Small first MTP joint effusion is likely aseptic and incidental.  Appearance of the medial sesamoid bone of the first MTP joint most consistent with chronic  osteonecrosis.   Electronically Signed   By: Inge Rise M.D.   On: 01/09/2014 13:06   Nm Pulmonary Perf And Vent  01/09/2014   CLINICAL DATA:  Evaluate for pulmonary embolism  EXAM: NUCLEAR MEDICINE VENTILATION - PERFUSION LUNG SCAN  TECHNIQUE: Ventilation images were obtained in multiple projections using inhaled aerosol technetium 99 M DTPA. Perfusion images were obtained in multiple projections after intravenous injection of Tc-32m MAA.  COMPARISON:  DG CHEST 1V PORT dated 01/08/2014  RADIOPHARMACEUTICALS:  40 mCi Tc-47m DTPA aerosol and 6 mCi Tc-75m MAA  FINDINGS: Review of chest radiograph performed and prior demonstrates enlarged cardiac silhouette and mediastinal contours. Endotracheal tube overlies the tracheal air column with tip superior to the carina. There are bibasilar heterogeneous opacities, left greater than right. Small left-sided pleural effusion. Mild pulmonary venous congestion without definite evidence of edema.  Ventilation: There is clumping of inhaled radiotracer about the bilateral pulmonary hila. There is otherwise adequate ventilation of the bilateral pulmonary parenchyma. There is ingested radiotracer seen within the hypopharynx, stomach and proximal small bowel.  Perfusion: There is a linear area of relative oligemia about the right major fissure secondary to known partially layering pleural effusion. There is otherwise relative homogeneous distribution of injected radiotracer throughout the pulmonary parenchyma. There are no discrete mismatched segmental or subsegmental filling defects to suggest pulmonary embolism.  IMPRESSION: Pulmonary embolism absent (very low probability for pulmonary embolism).   Electronically Signed   By: Sandi Mariscal M.D.   On: 01/09/2014 11:44   [place] Art/ven Flow Abd Pelv Doppler  01/08/2014   CLINICAL DATA:  Hypotension and worsening liver function tests.  EXAM: DUPLEX ULTRASOUND OF LIVER  TECHNIQUE: Color and duplex Doppler ultrasound was performed  to evaluate the hepatic in-flow and out-flow vessels.  COMPARISON:  DG ABD PORTABLE 1V dated 01/07/2014; CT CHEST W/O CM dated 01/07/2014; US ABDOMEN LIMITED RUQ/ASCITES dated 01/08/2014  FINDINGS: Portal Vein Velocities  Main:  28-30 cm/sec  Right:  21 cm/sec  Left:  19 cm/sec  Hepatic Vein Velocities  Right:  48 cm/sec  Middle:  44 cm/sec  Left:  40 cm/sec  Hepatic Artery Velocity:  60 cm/sec  Splenic Vein Velocity:  21 cm/sec  Varices: None identified.  Ascites: None identified.  The portal and main hepatic veins are widely patent without evidence of thrombus. Flow direction and velocities are normal. The intrahepatic IVC is widely patent. The spleen is normal in size.  IMPRESSION: Unremarkable duplex ultrasound of the liver demonstrating no evidence of portal vein thrombosis or hepatic veno-occlusive disease.   Electronically Signed   By: Aletta Edouard M.D.   On: 01/08/2014 11:57   US Abdomen Limited Ruq  01/08/2014   CLINICAL DATA:  Abdominal pain  EXAM: US ABDOMEN LIMITED - RIGHT UPPER QUADRANT  COMPARISON:  [place] ART/VEN ABD/PELV/SCROTUM DOP dated 01/08/2014; CT CHEST W/O CM dated 01/07/2014  FINDINGS: Gallbladder:  There is marked thickening of the gallbladder wall measuring approximately 1.1 cm in diameter (image 8). This finding is associated with potential very minimal amount of associated pericholecystic fluid. No definitive radiopaque gallstones or gallbladder sludge. Sonographic Percell Miller sign is unable to be performed secondary to patient is unresponsive state.  Common bile duct:  Diameter: Normal in size measuring 5.8 mm in diameter  Liver:  Homogeneous hepatic echotexture. No discrete hepatic lesions. No definite evidence of intrahepatic biliary ductal dilatation. No ascites.  IMPRESSION: Findings are worrisome for acute acalculous cholecystitis. Further evaluation could be performed with HIDA scan as clinically indicated.  These results will be called to the ordering clinician or representative by the  Radiologist Assistant, and communication documented in the PACS Dashboard.   Electronically Signed   By: Sandi Mariscal M.D.   On: 01/08/2014 12:07   Medications: I have reviewed the patient's current medications. Scheduled Meds: . insulin aspart  0-9 Units Subcutaneous 6 times per day  . levETIRAcetam  500 mg Intravenous Q12H  . linezolid  600 mg Intravenous Q12H  . piperacillin-tazobactam (ZOSYN)  IV  3.375 g Intravenous Q8H  . verapamil  80 mg Oral Q12H   Continuous Infusions: . sodium chloride 20 mL/hr (01/10/14 0537)  . heparin 1,550 Units/hr (01/10/14 0602)   PRN Meds:.labetalol, sodium chloride Assessment/Plan: Principal Problem:   Cellulitis Active Problems:   Hypertension   Diabetes mellitus with renal manifestation   Acute respiratory failure   Acute encephalopathy   Shock   New onset seizure   Acute renal failure   Sepsis   LOS: 4 days  Mr. Drewes is a 63yo man with history of T2DM, HTN, L foot ulcer, previously treated extrapulmonary TB who has returned to IMTS from ICU with dCHF with ARF, possible acalculous cholecystitis, and acute DVT w/o PE.  # Septic shock likely due to acalculous cholecystitis: Multiple etiologies have been worked up and most likely cause include acute acalculous cholecystitis, non-coagulase staph bacteremia, acute DVT, LLE ulcer and unknown source all in the setting of patient using verapamil. Acalculous cholecystitis was found on RUQ ultrasound with gallbladder wall thickening of 1.1cm. Patient has had consistently elevated alkaline phosphate since November, but only slightly elevated AST and ALTs. However, after patient's acute respiratory failure, AST (158) and ALT (128) were both elevated. Patient consistently had low albumin (1.3-1.7). TBili was always normal. The ICU setting can lead to acute acalculous cholecystitis, but the 10-month long symptom suggests an underlying gallbladder disorder, and possible peptic ulcers given history of dark stools  associated with abdominal pain and a drop in hemoglobin from 13.9 (2010) to 8.2 (2015). One out of two blood cultures shows non-coagulase staph species that could have caused sepsis. Acute  DVT was suspected, but V/Q scan showed low probability of PE. MRI of LLE shows no osteomyelitis, thus ruling out infectious source there.  Plan  - GI consult appreciated for work up and treatment of acalculous cholecysitis - Stop Linezolid and Zosyn  - start oral Augmentin  - will give a PPI since this might be helpful with his pain  - HIDA scan may be needed per GI consult, and cholecystectomy may be needed per GI/surgery consult - discontinue Foley and CVL  # dCHF with ARF: patient presents with bilateral pleural effusion on CT, peripheral edema, JVD, SOB, ARF (Cr 1.75 on 2/13, but now 1.54 on 2/16), EF of 55, and grade 2 diastolic dysfunction on ECHO. Patient is 2.9L fluid positive from the day before and is 8.9L fluid positive since admission. SOB improved with one dose furosemide. ARF is resolving (Cr 1.54 today) - increased Verapamil to 120mg  daily  - Add Coreg 6.25 mg bid - may titrate the dose if the BP cont to remain elevated - hold off ACEI with AKI -switch to po Furosemide 40mg  daily  - Follow up on Renal Labs - will aid for negative or even fluid balance. He is still positive since admission   # Acute DVT: Patient was found to have bilateral acute DVT without PE. Sub-Therapeutic on heparin with UFH of 0.15 and goal of 0.3-0.7. UFH need to be increased per anticoagulation consult recommendation. Will consider to transition to outpatient anticoagulation pending surgery/procedure status.  -Anticoagulation team recs appreciated -on Hep gtt - start coumadin per pharmacy  - will likely require anticoagulants for three months for first time DVT   # Microcytic Anemia: hemoglobin of 8.2 in 2015, but 13.9 in 2010. Patient has been having RUQ pain with dark stools for 6 months or more. Likely causes include  peptic ulcer bleed and lower GI bleed. No NSAIDs were used at home. No colonoscopy on file. -PPI for upper GI bleed and RUQ pain after eating -Iron panel -Preventive colonoscopy need to be scheduled -EGD may be needed depending on work up  # Diabetic foot ulcer: MRI of L foot did not show osteomyelitis, but did show local cellulitis and possible myositis. -Reconsult wound care for debridement -F/u ortho consult -Zosyn   # Seizure: EEG showed medication effect. This is patient's first seizure in the setting of acute respiratory failure. This is most likely an isolated event. Patient does not have a history of substance abuse, and labs shows that electrolytes are in normal ranges. -Discontinue Keppra -Neuro check   Proph:  DVT: already on UFH  This is a Careers information officer Note.  The care of the patient was discussed with Dr. Linus Salmons and the assessment and plan formulated with their assistance.  Please see their attached note for official documentation of the daily encounter.  Konrad Saha 01/10/2014, 7:24 AM   I have seen the patient and reviewed the daily progress note by Elvin So MS IV and discussed the care of the patient with them.  I agree with note above as documented  Signed:  Jessee Avers, MD PGY-2 Internal Medicine Teaching Service Pager: [phone]

## 2014-01-10 NOTE — Progress Notes (Signed)
ANTICOAGULATION CONSULT NOTE - Follow Up Consult  Pharmacy Consult for heparin Indication: DVT  Labs:  Recent Labs  01/07/14 1511  01/07/14 1830 01/08/14 0102 01/08/14 0400 01/08/14 0620  01/08/14 2007 01/09/14 0428 01/10/14 0440  HGB 8.2*  --   --   --  9.2*  --   --   --  7.9* 8.2*  HCT 25.2*  --   --   --  28.2*  --   --   --  24.5* 25.7*  PLT 329  --   --   --  433*  --   --   --  415* 446*  HEPARINUNFRC  --   --   --   --  0.36  --   < > 0.52 0.43 0.15*  CREATININE 1.81*  --   --   --  1.68*  --   --   --  1.52*  --   TROPONINI  --   < > <0.30 <0.30  --  <0.30  --   --   --   --   < > = values in this interval not displayed.   Assessment: 63yo male now subtherapeutic on heparin; no gtt issues except RN changed IV site prior to MN without interruption of heparin gtt.  Noted that VQ reveals very low probability of PE.  Goal of Therapy:  Heparin level 0.3-0.7 units/ml   Plan:  Will rebolus with heparin 2000 units and increase gtt by 3 units/kg/hr to 1550 units/hr and check level in [place], PharmD, BCPS  01/10/2014,5:49 AM

## 2014-01-10 NOTE — Progress Notes (Signed)
Pt with BP 195/106. Pt also with some SOB and labored breathing. Elink MD notifed and orders placed. Went to give pt labetalol and rechecked BP with BP 164/80 manually. Will cont to monitor and medicate as needed.

## 2014-01-10 NOTE — Consult Note (Signed)
Reason for Consult: plantar ulcer left foot Referring Physician: Alcide Evener, MD  Joshua [name] is an 63 y.o. male.  HPI: Pt being followed for HTN, DM2, seizures, and b/l DVT, as well as plantar ulcer of the left foot.  Was treated 01/07/14 in ICU for AMS and respiratory failure after witnessed seizure. Pt recovered and was transferred to 3W. Pt stated he has had ulcer for several weeks now but feels no pain associated with it.  He states he has good sensation in his feet with no peripheral neuropathy diagnosed in past. Pt started on Heparin for b/l DVT.  Pt denies N/V/F/C, chest pain, or foot or ankle pain at this time.  On oral augmentin per primary team.  Past Medical History  Diagnosis Date  . 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 Hypertension 10/14/2012  . Type 2 diabetes mellitus   . Diabetic nephropathy with proteinuria   . 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 Knee arthroscopy  2010    3 times, right knee    FH:  noncontrib.  Social History:  reports that he quit smoking about 21 years ago. His smoking use included Cigarettes. He smoked 0.00 packs per day for 20 years. He has never used smokeless tobacco. He reports that he does not drink alcohol or use illicit drugs.  Allergies: No Known Allergies  Medications: I have reviewed the patient's current medications.  Results for orders placed during the hospital encounter of 01/06/14 (from the past 48 hour(s))  GLUCOSE, CAPILLARY     Status: Abnormal   Collection Time    01/08/14  7:26 PM      Result Value Ref Range   Glucose-Capillary 146 (*) 70 - 99 mg/dL   Comment 1 Notify RN    HEPARIN LEVEL (UNFRACTIONATED)     Status: None   Collection Time    01/08/14  8:07 PM      Result Value Ref Range   Heparin Unfractionated 0.52  0.30 - 0.70 IU/mL   Comment:            IF HEPARIN RESULTS ARE BELOW     EXPECTED VALUES, AND PATIENT     DOSAGE  HAS BEEN CONFIRMED,     SUGGEST FOLLOW UP TESTING     OF ANTITHROMBIN III LEVELS.  GLUCOSE, CAPILLARY     Status: None   Collection Time    01/08/14 11:37 PM      Result Value Ref Range   Glucose-Capillary 87  70 - 99 mg/dL  GLUCOSE, CAPILLARY     Status: None   Collection Time    01/09/14  3:27 AM      Result Value Ref Range   Glucose-Capillary 73  70 - 99 mg/dL  HEPARIN LEVEL (UNFRACTIONATED)     Status: None   Collection Time    01/09/14  4:28 AM      Result Value Ref Range   Heparin Unfractionated 0.43  0.30 - 0.70 IU/mL   Comment:            IF HEPARIN RESULTS ARE BELOW     EXPECTED VALUES, AND PATIENT     DOSAGE HAS BEEN CONFIRMED,     SUGGEST FOLLOW UP TESTING     OF ANTITHROMBIN III LEVELS.  CBC     Status: Abnormal   Collection Time    01/09/14  4:28 AM      Result Value  Ref Range   WBC 13.9 (*) 4.0 - 10.5 K/uL   RBC 3.32 (*) 4.22 - 5.81 MIL/uL   Hemoglobin 7.9 (*) 13.0 - 17.0 g/dL   HCT 24.5 (*) 39.0 - 52.0 %   MCV 73.8 (*) 78.0 - 100.0 fL   MCH 23.8 (*) 26.0 - 34.0 pg   MCHC 32.2  30.0 - 36.0 g/dL   RDW 17.6 (*) 11.5 - 15.5 %   Platelets 415 (*) 150 - 400 K/uL  COMPREHENSIVE METABOLIC PANEL     Status: Abnormal   Collection Time    01/09/14  4:28 AM      Result Value Ref Range   Sodium 141  137 - 147 mEq/L   Potassium 3.6 (*) 3.7 - 5.3 mEq/L   Chloride 107  96 - 112 mEq/L   CO2 23  19 - 32 mEq/L   Glucose, Bld 80  70 - 99 mg/dL   BUN 26 (*) 6 - 23 mg/dL   Creatinine, Ser 1.52 (*) 0.50 - 1.35 mg/dL   Calcium 7.7 (*) 8.4 - 10.5 mg/dL   Total Protein 5.9 (*) 6.0 - 8.3 g/dL   Albumin 1.3 (*) 3.5 - 5.2 g/dL   AST 66 (*) 0 - 37 U/L   ALT 94 (*) 0 - 53 U/L   Alkaline Phosphatase 154 (*) 39 - 117 U/L   Total Bilirubin 0.2 (*) 0.3 - 1.2 mg/dL   GFR calc non Af Amer 47 (*) >90 mL/min   GFR calc Af Amer 55 (*) >90 mL/min   Comment: (NOTE)     The eGFR has been calculated using the CKD EPI equation.     This calculation has not been validated in all clinical  situations.     eGFR's persistently <90 mL/min signify possible Chronic Kidney     Disease.  PROCALCITONIN     Status: None   Collection Time    01/09/14  4:28 AM      Result Value Ref Range   Procalcitonin 1.44     Comment:            Interpretation:     PCT > 0.5 ng/mL and <= 2 ng/mL:     Systemic infection (sepsis) is possible,     but other conditions are known to elevate     PCT as well.     (NOTE)             ICU PCT Algorithm               Non ICU PCT Algorithm        ----------------------------     ------------------------------             PCT < 0.25 ng/mL                 PCT < 0.1 ng/mL         Stopping of antibiotics            Stopping of antibiotics           strongly encouraged.               strongly encouraged.        ----------------------------     ------------------------------           PCT level decrease by               PCT < 0.25 ng/mL           >= 80%  from peak PCT           OR PCT 0.25 - 0.5 ng/mL          Stopping of antibiotics                                                 encouraged.         Stopping of antibiotics               encouraged.        ----------------------------     ------------------------------           PCT level decrease by              PCT >= 0.25 ng/mL           < 80% from peak PCT            AND PCT >= 0.5 ng/mL            Continuing antibiotics                                                  encouraged.           Continuing antibiotics                encouraged.        ----------------------------     ------------------------------         PCT level increase compared          PCT > 0.5 ng/mL             with peak PCT AND              PCT >= 0.5 ng/mL             Escalation of antibiotics                                              strongly encouraged.          Escalation of antibiotics            strongly encouraged.  GLUCOSE, CAPILLARY     Status: Abnormal   Collection Time    01/09/14  7:09 AM      Result Value Ref  Range   Glucose-Capillary 107 (*) 70 - 99 mg/dL  GLUCOSE, CAPILLARY     Status: Abnormal   Collection Time    01/09/14 12:38 PM      Result Value Ref Range   Glucose-Capillary 160 (*) 70 - 99 mg/dL  GLUCOSE, CAPILLARY     Status: Abnormal   Collection Time    01/09/14  5:15 PM      Result Value Ref Range   Glucose-Capillary 133 (*) 70 - 99 mg/dL   Comment 1 Notify RN    GLUCOSE, CAPILLARY     Status: Abnormal   Collection Time    01/09/14  9:59 PM      Result Value Ref Range   Glucose-Capillary 195 (*) 70 - 99 mg/dL  GLUCOSE, CAPILLARY     Status: Abnormal  Collection Time    01/10/14  1:28 AM      Result Value Ref Range   Glucose-Capillary 168 (*) 70 - 99 mg/dL  GLUCOSE, CAPILLARY     Status: Abnormal   Collection Time    01/10/14  3:52 AM      Result Value Ref Range   Glucose-Capillary 145 (*) 70 - 99 mg/dL  HEPARIN LEVEL (UNFRACTIONATED)     Status: Abnormal   Collection Time    01/10/14  4:40 AM      Result Value Ref Range   Heparin Unfractionated 0.15 (*) 0.30 - 0.70 IU/mL   Comment:            IF HEPARIN RESULTS ARE BELOW     EXPECTED VALUES, AND PATIENT     DOSAGE HAS BEEN CONFIRMED,     SUGGEST FOLLOW UP TESTING     OF ANTITHROMBIN III LEVELS.  BASIC METABOLIC PANEL     Status: Abnormal   Collection Time    01/10/14  4:40 AM      Result Value Ref Range   Sodium 139  137 - 147 mEq/L   Potassium 3.7  3.7 - 5.3 mEq/L   Chloride 105  96 - 112 mEq/L   CO2 22  19 - 32 mEq/L   Glucose, Bld 154 (*) 70 - 99 mg/dL   BUN 22  6 - 23 mg/dL   Creatinine, Ser 1.53 (*) 0.50 - 1.35 mg/dL   Calcium 8.1 (*) 8.4 - 10.5 mg/dL   GFR calc non Af Amer 47 (*) >90 mL/min   GFR calc Af Amer 55 (*) >90 mL/min   Comment: (NOTE)     The eGFR has been calculated using the CKD EPI equation.     This calculation has not been validated in all clinical situations.     eGFR's persistently <90 mL/min signify possible Chronic Kidney     Disease.  CBC     Status: Abnormal   Collection  Time    01/10/14  4:40 AM      Result Value Ref Range   WBC 20.7 (*) 4.0 - 10.5 K/uL   RBC 3.48 (*) 4.22 - 5.81 MIL/uL   Hemoglobin 8.2 (*) 13.0 - 17.0 g/dL   HCT 25.7 (*) 39.0 - 52.0 %   MCV 73.9 (*) 78.0 - 100.0 fL   MCH 23.6 (*) 26.0 - 34.0 pg   MCHC 31.9  30.0 - 36.0 g/dL   RDW 17.9 (*) 11.5 - 15.5 %   Platelets 446 (*) 150 - 400 K/uL  GLUCOSE, CAPILLARY     Status: Abnormal   Collection Time    01/10/14  7:42 AM      Result Value Ref Range   Glucose-Capillary 145 (*) 70 - 99 mg/dL  GLUCOSE, CAPILLARY     Status: Abnormal   Collection Time    01/10/14 11:35 AM      Result Value Ref Range   Glucose-Capillary 115 (*) 70 - 99 mg/dL  HEPARIN LEVEL (UNFRACTIONATED)     Status: Abnormal   Collection Time    01/10/14  2:40 PM      Result Value Ref Range   Heparin Unfractionated 0.26 (*) 0.30 - 0.70 IU/mL   Comment:            IF HEPARIN RESULTS ARE BELOW     EXPECTED VALUES, AND PATIENT     DOSAGE HAS BEEN CONFIRMED,     SUGGEST FOLLOW UP TESTING  OF ANTITHROMBIN III LEVELS.  GLUCOSE, CAPILLARY     Status: Abnormal   Collection Time    01/10/14  4:53 PM      Result Value Ref Range   Glucose-Capillary 152 (*) 70 - 99 mg/dL    Mr Foot Left W Wo Contrast  01/09/2014   CLINICAL DATA:  Nonhealing wound on the plantar surface of the foot the first MTP joint in the diabetic patient.  EXAM: MRI OF THE LEFT FOREFOOT WITHOUT AND WITH CONTRAST  TECHNIQUE: Multiplanar, multisequence MR imaging was performed both before and after administration of intravenous contrast.  CONTRAST:  15 mL MULTIHANCE GADOBENATE DIMEGLUMINE 529 MG/ML IV SOLN  COMPARISON:  Plain films of the left foot 01/05/2014.  FINDINGS: There is a skin ulceration on the plantar surface the foot at the level the first MTP joint. Extensive subcutaneous edema and enhancement are present about the foot consistent with cellulitis. No bone marrow signal abnormality to suggest osteomyelitis is seen. The medial sesamoid bone  demonstrates decreased signal on both T1 and T2 weighted images without postcontrast enhancement likely due to chronic osteonecrosis. All visualized intrinsic musculature of the foot demonstrates increased T2 signal. There is postcontrast enhancement in the flexor hallucis brevis muscle. The patient has a small first MTP joint effusion.  IMPRESSION: Skin ulceration on the plantar surface of the foot at the first MTP joint without underlying abscess or osteomyelitis. Changes of diffuse cellulitis are identified. Edema and enhancement in the flexor hallucis brevis muscle are compatible with myositis.  Small first MTP joint effusion is likely aseptic and incidental.  Appearance of the medial sesamoid bone of the first MTP joint most consistent with chronic osteonecrosis.   Electronically Signed   By: Inge Rise M.D.   On: 01/09/2014 13:06   Nm Pulmonary Perf And Vent  01/09/2014   CLINICAL DATA:  Evaluate for pulmonary embolism  EXAM: NUCLEAR MEDICINE VENTILATION - PERFUSION LUNG SCAN  TECHNIQUE: Ventilation images were obtained in multiple projections using inhaled aerosol technetium 99 M DTPA. Perfusion images were obtained in multiple projections after intravenous injection of Tc-68mMAA.  COMPARISON:  DG CHEST 1V PORT dated 01/08/2014  RADIOPHARMACEUTICALS:  40 mCi Tc-933mTPA aerosol and 6 mCi Tc-9984mA  FINDINGS: Review of chest radiograph performed and prior demonstrates enlarged cardiac silhouette and mediastinal contours. Endotracheal tube overlies the tracheal air column with tip superior to the carina. There are bibasilar heterogeneous opacities, left greater than right. Small left-sided pleural effusion. Mild pulmonary venous congestion without definite evidence of edema.  Ventilation: There is clumping of inhaled radiotracer about the bilateral pulmonary hila. There is otherwise adequate ventilation of the bilateral pulmonary parenchyma. There is ingested radiotracer seen within the hypopharynx,  stomach and proximal small bowel.  Perfusion: There is a linear area of relative oligemia about the right major fissure secondary to known partially layering pleural effusion. There is otherwise relative homogeneous distribution of injected radiotracer throughout the pulmonary parenchyma. There are no discrete mismatched segmental or subsegmental filling defects to suggest pulmonary embolism.  IMPRESSION: Pulmonary embolism absent (very low probability for pulmonary embolism).   Electronically Signed   By: JohSandi MariscalD.   On: 01/09/2014 11:44    Review of Systems  Constitutional: Negative.   HENT: Negative.   Eyes: Negative.   Respiratory: Negative.   Cardiovascular: Negative.   Gastrointestinal: Positive for abdominal pain. Negative for nausea and vomiting.  Neurological: Negative for dizziness and tremors.  Psychiatric/Behavioral: The patient is not nervous/anxious.  Blood pressure 142/76, pulse 79, temperature 98.5 F (36.9 C), temperature source Oral, resp. rate 20, height _0  (1.676 m), weight 68.539 kg (151 lb 1.6 oz), SpO2 100.00%. Physical Exam 63 y/o male in NAD, A/Ox3, appears stated age.  Mood and affect normal. respirations somewhat labored. EOMI. Mild venous stasis to lower extremities.  Plantar ulcer noted at plantar aspect of left 1st MT head. 1+ dp pulse.  5/5 strength in PF and DF of the ankle.  +erythema/edema with small amount of drainage noted.  Ulcer probed with CTA and found to have no exposed bone noted.  Ulcer probes to a depth of 2-3 cm into the first webspace.  There is purulent drainage from the wound.  No lymphadenopathy.  Decreased sens to LT at the forefoot.  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  We'll plan to debride the wound by the bedside tomorrow morning and culture the purulent discharge.  Consideration could be given to a course of IV abx  but will defer to ID specialist Dr. Linus Salmons.  We'll start packing the wound in an effort to prevent abscess formation.  This is a complicated situation in a patient with a complex medical history that has considerable risk to progress to amputation.  [name], Joshua S 01/10/2014, 7:01 PM

## 2014-01-10 NOTE — Progress Notes (Signed)
Nutrition Education Note  RD drawn to chart secondary to current prescription of Zyvox, a medication with potential interactions with high tyramine-containing foods.  Hospital diet is not high in tyramine and poses no risk for patient at this time.   No nutrition interventions warranted at this time. If additional nutrition issues arise or education warranted, please consult RD.   Molli Barrows, RD, LDN, [place] Pager [phone] After Hours Pager [phone]

## 2014-01-11 ENCOUNTER — Encounter (HOSPITAL_COMMUNITY): Payer: Self-pay | Admitting: General Surgery

## 2014-01-11 DIAGNOSIS — D509 Iron deficiency anemia, unspecified: Secondary | ICD-10-CM | POA: Diagnosis present

## 2014-01-11 DIAGNOSIS — R748 Abnormal levels of other serum enzymes: Secondary | ICD-10-CM

## 2014-01-11 DIAGNOSIS — I5032 Chronic diastolic (congestive) heart failure: Secondary | ICD-10-CM | POA: Diagnosis present

## 2014-01-11 DIAGNOSIS — I824Y9 Acute embolism and thrombosis of unspecified deep veins of unspecified proximal lower extremity: Secondary | ICD-10-CM

## 2014-01-11 DIAGNOSIS — K810 Acute cholecystitis: Secondary | ICD-10-CM

## 2014-01-11 DIAGNOSIS — K819 Cholecystitis, unspecified: Secondary | ICD-10-CM

## 2014-01-11 DIAGNOSIS — Z86718 Personal history of other venous thrombosis and embolism: Secondary | ICD-10-CM | POA: Diagnosis present

## 2014-01-11 DIAGNOSIS — N181 Chronic kidney disease, stage 1: Secondary | ICD-10-CM

## 2014-01-11 LAB — HEPATIC FUNCTION PANEL
ALT: 66 U/L — AB (ref 0–53)
AST: 36 U/L (ref 0–37)
Albumin: 1.6 g/dL — ABNORMAL LOW (ref 3.5–5.2)
Alkaline Phosphatase: 236 U/L — ABNORMAL HIGH (ref 39–117)
BILIRUBIN INDIRECT: 0.2 mg/dL — AB (ref 0.3–0.9)
Bilirubin, Direct: 0.2 mg/dL (ref 0.0–0.3)
TOTAL PROTEIN: 7.3 g/dL (ref 6.0–8.3)
Total Bilirubin: 0.4 mg/dL (ref 0.3–1.2)

## 2014-01-11 LAB — URINALYSIS, ROUTINE W REFLEX MICROSCOPIC
BILIRUBIN URINE: NEGATIVE
Glucose, UA: 250 mg/dL — AB
Ketones, ur: NEGATIVE mg/dL
LEUKOCYTES UA: NEGATIVE
NITRITE: NEGATIVE
PH: 5.5 (ref 5.0–8.0)
Specific Gravity, Urine: 1.016 (ref 1.005–1.030)
UROBILINOGEN UA: 0.2 mg/dL (ref 0.0–1.0)

## 2014-01-11 LAB — GLUCOSE, CAPILLARY
GLUCOSE-CAPILLARY: 156 mg/dL — AB (ref 70–99)
GLUCOSE-CAPILLARY: 197 mg/dL — AB (ref 70–99)
Glucose-Capillary: 131 mg/dL — ABNORMAL HIGH (ref 70–99)
Glucose-Capillary: 184 mg/dL — ABNORMAL HIGH (ref 70–99)

## 2014-01-11 LAB — BASIC METABOLIC PANEL
BUN: 23 mg/dL (ref 6–23)
CHLORIDE: 104 meq/L (ref 96–112)
CO2: 22 mEq/L (ref 19–32)
Calcium: 8.4 mg/dL (ref 8.4–10.5)
Creatinine, Ser: 1.77 mg/dL — ABNORMAL HIGH (ref 0.50–1.35)
GFR calc Af Amer: 46 mL/min — ABNORMAL LOW (ref >90)
GFR calc non Af Amer: 39 mL/min — ABNORMAL LOW (ref >90)
GLUCOSE: 145 mg/dL — AB (ref 70–99)
Potassium: 3.7 mEq/L (ref 3.7–5.3)
Sodium: 140 mEq/L (ref 137–147)

## 2014-01-11 LAB — CBC
HEMATOCRIT: 25.3 % — AB (ref 39.0–52.0)
Hemoglobin: 8 g/dL — ABNORMAL LOW (ref 13.0–17.0)
MCH: 23.5 pg — AB (ref 26.0–34.0)
MCHC: 31.6 g/dL (ref 30.0–36.0)
MCV: 74.2 fL — AB (ref 78.0–100.0)
Platelets: 420 10*3/uL — ABNORMAL HIGH (ref 150–400)
RBC: 3.41 MIL/uL — ABNORMAL LOW (ref 4.22–5.81)
RDW: 18 % — AB (ref 11.5–15.5)
WBC: 23.1 10*3/uL — AB (ref 4.0–10.5)

## 2014-01-11 LAB — URINE MICROSCOPIC-ADD ON

## 2014-01-11 LAB — CREATININE, URINE, RANDOM: Creatinine, Urine: 52.19 mg/dL

## 2014-01-11 LAB — HEPARIN LEVEL (UNFRACTIONATED): Heparin Unfractionated: 0.37 IU/mL (ref 0.30–0.70)

## 2014-01-11 LAB — PROTIME-INR
INR: 1.26 (ref 0.00–1.49)
Prothrombin Time: 15.5 seconds — ABNORMAL HIGH (ref 11.6–15.2)

## 2014-01-11 LAB — GAMMA GT: GGT: 202 U/L — AB (ref 7–51)

## 2014-01-11 LAB — SODIUM, URINE, RANDOM: Sodium, Ur: 60 mEq/L

## 2014-01-11 LAB — OCCULT BLOOD X 1 CARD TO LAB, STOOL: Fecal Occult Bld: POSITIVE — AB

## 2014-01-11 MED ORDER — WARFARIN SODIUM 7.5 MG PO TABS
7.5000 mg | ORAL_TABLET | Freq: Once | ORAL | Status: AC
  Administered 2014-01-11: 7.5 mg via ORAL
  Filled 2014-01-11: qty 1

## 2014-01-11 MED ORDER — SODIUM CHLORIDE 0.9 % IV SOLN
3.0000 g | Freq: Three times a day (TID) | INTRAVENOUS | Status: DC
  Administered 2014-01-11 – 2014-01-13 (×7): 3 g via INTRAVENOUS
  Filled 2014-01-11 (×9): qty 3

## 2014-01-11 MED ORDER — HYDRALAZINE HCL 10 MG PO TABS
10.0000 mg | ORAL_TABLET | Freq: Four times a day (QID) | ORAL | Status: DC
  Administered 2014-01-11 – 2014-01-12 (×5): 10 mg via ORAL
  Filled 2014-01-11 (×7): qty 1

## 2014-01-11 MED ORDER — DIPHENHYDRAMINE HCL 25 MG PO CAPS
50.0000 mg | ORAL_CAPSULE | Freq: Once | ORAL | Status: AC
  Administered 2014-01-11: 50 mg via ORAL
  Filled 2014-01-11: qty 2

## 2014-01-11 NOTE — Progress Notes (Signed)
Subjective: Pt evaluated this AM for plantar ulcer of the left foot.  Pain rated at mild this AM, pt tired with some SOB.  Pt receiving IV antibiotics (Unasyn) for coverage of possible cholecystitis.  Pt does have c/o RUQ pain in the abdomen.  Pt denies N/V/F/C, chest pain, calf pain, and changes in appetite.   Objective: Vital signs in last 24 hours: Temp:  [97.7 F (36.5 C)-97.8 F (36.6 C)] 97.8 F (36.6 C) (02/17 0500) Pulse Rate:  [84-88] 88 (02/17 0500) Resp:  [18] 18 (02/17 0500) BP: (149-186)/(80-98) 149/80 mmHg (02/17 0500) SpO2:  [98 %-99 %] 98 % (02/17 0500) Weight:  [68.5 kg (151 lb 0.2 oz)] 68.5 kg (151 lb 0.2 oz) (02/17 0500)  Intake/Output from previous day: 02/16 0701 - 02/17 0700 In: 630 [IV Piggyback:630] Out: 450 [Urine:450] Intake/Output this shift:     Recent Labs  01/09/14 0428 01/10/14 0440 01/11/14 0605  HGB 7.9* 8.2* 8.0*    Recent Labs  01/10/14 0440 01/11/14 0605  WBC 20.7* 23.1*  RBC 3.48* 3.41*  HCT 25.7* 25.3*  PLT 446* 420*    Recent Labs  01/10/14 0440 01/11/14 0605  NA 139 140  K 3.7 3.7  CL 105 104  CO2 22 22  BUN 22 23  CREATININE 1.53* 1.77*  GLUCOSE 154* 145*  CALCIUM 8.1* 8.4    Recent Labs  01/10/14 1920 01/11/14 0605  INR 1.08 1.26    NAD, appears stated age, somnolent, breathing somewhat labored.  On physical exam, dressing taken down on left foot.  Plantar ulcer appears with edema, mild purulent drainage and callous border.  Dorsalis pedis pulse 1+ bilaterally.  Distal toes are perfused however have extended cap refill at 5 sec. Skin of feet appears dry with venous stasis noted at ankles.   Procedure: After receiving consent, I pared down the callous border surrounding plantar ulcer which uncovered the ulcer approximately 0.5cm.  I took cultures of the drainage for aerobic and anaerobic bacteria as well as C&S.  Using Iodoform cause I carefully packed the ulcer and dressed the wound with a dry sterile dressing.   Dressing was then wrapped with curlex gauze.  Assessment/Plan: Continue Antibiotics Continue medical management as dictated by Dr. Scharlene Gloss We will continue dressing changes with iodoform gauze for next several days in combination with antibiotic treatment.     FLOWERS, CHRISTOPHER S 01/11/2014, 12:53 PM

## 2014-01-11 NOTE — Consult Note (Signed)
I saw the patient, participated in the history, exam and medical decision making, and concur with the physician assistant's note above.  C/o some RUQ pain Alert, nad, nontoxic Soft, mild RUQ TTP  Elevated lfts ruq pain Acalculous cholecystitis Cover with broad spectrum abx Confirm dx with HIDA - no narcotics 6hr prior to study If + for acute cholecystitis options are iv abx alone vs abx + perc drain --not a surgical candidate during this admission with current medical issues  Leighton Ruff. Redmond Pulling, MD, FACS General, Bariatric, & Minimally Invasive Surgery [facility], [place]

## 2014-01-11 NOTE — Progress Notes (Signed)
Subjective: Still short of breath.  Objective: Vital signs in last 24 hours: Filed Vitals:   01/10/14 2054 01/10/14 2300 01/11/14 0500 01/11/14 1353  BP: 186/98 174/88 149/80 181/96  Pulse: 84  88 86  Temp: 97.7 F (36.5 C)  97.8 F (36.6 C) 97.4 F (36.3 C)  TempSrc: Oral  Oral Oral  Resp: '18  18 20  ' Height:      Weight:   151 lb 0.2 oz (68.5 kg)   SpO2: 99%  98% 100%   Weight change: -1.4 oz (-0.039 kg)  Intake/Output Summary (Last 24 hours) at 01/11/14 1355 Last data filed at 01/11/14 0017  Gross per 24 hour  Intake    630 ml  Output    450 ml  Net    180 ml   General: Vital signs reviewed. Alert, oriented to situation, year, month, date, and location.  Lungs: Bilateral crackles in the lung bases.  Heart: Regular; no extra sounds or murmurs  Abdomen: Bowel sounds present, soft, nontender; no hepatosplenomegaly  Extremities: Moderate bilateral ankle edema  Neurologic: Alert and oriented x3; moves all limbs.  Lab Results: Basic Metabolic Panel:  Recent Labs Lab 01/10/14 0440 01/11/14 0605  NA 139 140  K 3.7 3.7  CL 105 104  CO2 22 22  GLUCOSE 154* 145*  BUN 22 23  CREATININE 1.53* 1.77*  CALCIUM 8.1* 8.4   Liver Function Tests:  Recent Labs Lab 01/09/14 0428 01/11/14 1115  AST 66* 36  ALT 94* 66*  ALKPHOS 154* 236*  BILITOT 0.2* 0.4  PROT 5.9* 7.3  ALBUMIN 1.3* 1.6*    Recent Labs Lab 01/08/14 0400  LIPASE 18  AMYLASE 118*  CBC:  Recent Labs Lab 01/05/14 1616  01/10/14 0440 01/11/14 0605  WBC 14.1*  < > 20.7* 23.1*  NEUTROABS 11.1*  --   --   --   HGB 9.3*  < > 8.2* 8.0*  HCT 28.8*  < > 25.7* 25.3*  MCV 74.0*  < > 73.9* 74.2*  PLT 439*  < > 446* 420*  < > = values in this interval not displayed. Cardiac Enzymes:  Recent Labs Lab 01/07/14 1830 01/08/14 0102 01/08/14 0620  TROPONINI <0.30 <0.30 <0.30   CBG:  Recent Labs Lab 01/10/14 0742 01/10/14 1135 01/10/14 1653 01/10/14 2200 01/11/14 0758 01/11/14 1210  GLUCAP  145* 115* 152* 169* 131* 156*    Fasting Lipid Panel:  Recent Labs Lab 01/05/14 1616  CHOL 175  HDL 83  LDLCALC 81  TRIG 53  CHOLHDL 2.1     Urinalysis:  Recent Labs Lab 01/07/14 1952  COLORURINE AMBER*  LABSPEC 1.029  PHURINE 5.0  GLUCOSEU 250*  HGBUR LARGE*  BILIRUBINUR SMALL*  KETONESUR 15*  PROTEINUR >300*  UROBILINOGEN 1.0  NITRITE NEGATIVE  LEUKOCYTESUR NEGATIVE    Micro Results: Recent Results (from the past 240 hour(s))  CULTURE, BLOOD (ROUTINE X 2)     Status: None   Collection Time    01/07/14  8:15 AM      Result Value Ref Range Status   Specimen Description BLOOD LEFT ARM   Final   Special Requests BOTTLES DRAWN AEROBIC AND ANAEROBIC 10CC   Final   Culture  Setup Time     Final   Value: 01/07/2014 14:02     Performed at [facility]   Culture     Final   Value: STAPHYLOCOCCUS SPECIES (COAGULASE NEGATIVE)     Note: THE SIGNIFICANCE OF ISOLATING THIS ORGANISM FROM A  SINGLE SET OF BLOOD CULTURES WHEN MULTIPLE SETS ARE DRAWN IS UNCERTAIN. PLEASE NOTIFY THE MICROBIOLOGY DEPARTMENT WITHIN ONE WEEK IF SPECIATION AND SENSITIVITIES ARE REQUIRED.     Note: Gram Stain Report Called to,Read Back By and Verified With: [facility] 01/08/14 @ 5:56PM BY RUSCOE A.     Performed at [facility]   Report Status 01/09/2014 FINAL   Final  CULTURE, BLOOD (ROUTINE X 2)     Status: None   Collection Time    01/07/14  8:30 AM      Result Value Ref Range Status   Specimen Description BLOOD LEFT ARM   Final   Special Requests BOTTLES DRAWN AEROBIC AND ANAEROBIC 10CC   Final   Culture  Setup Time     Final   Value: 01/07/2014 14:02     Performed at [facility]   Culture     Final   Value:        BLOOD CULTURE RECEIVED NO GROWTH TO DATE CULTURE WILL BE HELD FOR 5 DAYS BEFORE ISSUING A FINAL NEGATIVE REPORT     Performed at [facility]   Report Status PENDING   Incomplete   Studies/Results: No results found. Medications: I have  reviewed the patient's current medications. Scheduled Meds: . ampicillin-sulbactam (UNASYN) IVPB 3 g  3 g Intravenous Q8H  . carvedilol  6.25 mg Oral BID WC  . furosemide  40 mg Oral Daily  . insulin aspart  0-9 Units Subcutaneous TID WC  . levETIRAcetam  500 mg Intravenous Q12H  . pantoprazole  20 mg Oral QAC breakfast  . verapamil  120 mg Oral Daily  . warfarin  7.5 mg Oral ONCE-1800  . Warfarin - Pharmacist Dosing Inpatient   Does not apply q1800   Continuous Infusions: . sodium chloride 20 mL/hr (01/10/14 0537)  . heparin 1,750 Units/hr (01/10/14 1655)   PRN Meds:.labetalol, sodium chloride Assessment/Plan:  # Acalculous Cholecystitis with sepsis: This is the most likely cause of his sepsis on presentation. Abdomen ultrasound scan performed on 01/07/2014 revealed acute acalculous cholecystitis. Patient is currently afebrile, but has leukocytosis of 17>> 23. He continues to complain of right upper quadrant pain which tends to worsen after meals. Noted elevated alkaline phosphatase, but normal total bilirubin Plan. -Continue with Unasyn - Consulted Gen Surgery - Dr. Greer Pickerel  # Acute on chronic dCHF:  Patient continues to appear volume overloaded. Echocardiogram on 01/08/2014 revealed normal ejection fraction, but grade 2 diastolic dysfunction. He continues to have a positive fluid balance of 9.1 L since he moved out of the ICU. Plan. -Continue with Lasix 40 mg twice a day - Strict ins and outs.  -Daily weights.  - Monitor electrolytes daily, and replace as needed  # Hypertension: Blood pressure well-controlled since starting verapamil 120 mg daily, Coreg , 6.25 mg twice a day, and addition of Lasix by mouth 40 mg daily. Will continue with the current regimen. If blood pressure continues to be elevated, will consider increasing his Coreg or restarting his ACEi if renal function is better.  # Acute renal Injury: In the setting of diabetic nephropathy and nephrotic syndrome.  Baseline creatinine is normal. Since diuresis, patient's creatinine has been progressively getting worse. from Cr 0.53 to 1.77. Other etiologies to be considered include cardiorenal-syndrome (which can improve with diuresis) and sepsis, which is currently resolving. No prior renal scans. Urinalysis on admission revealed granular casts and heavy proteinuria, consistent with nephrotic syndrome. Plan. - Repeat urinalysis. - Renal functions  likely to improve with diuretics, the cause of his CHF. - Will consider renal ultrasound scan. If he does not improve. - Urine chemistries including BUN and creatinine  # Left leg Cellulitis: was another possible source of his sepsis on presentation. Surgery was consulted and debridement at bedside has been performed on 01/11/2014. Plan. - Wound cultures sent. - Continue with Unasyn - Wound consult  # DVT bilateral legs: Bilateral popliteal vein DVTs discovered recently while he was in the ICU. These are first time DVTs, unprovoked. Chest VQ scan did not reveal any pulmonary embolus. Most likely predisposing factors nephrotic syndrome. Currently, he is being anticoagulated with heparin, and Coumadin as per pharmacy. Plan. -Continue with Coumadin per pharmacy - Will continue with outpatient Coumadin therapy for 3 months (end date 02/03/2014) - Consider labs for coagulopathy 6 weeks after completing his Coumadin therapy   # Elevated Phosphatase: Patient was found to have elevated alkaline phosphatase without accompanying elevation in AST and ALT. In addition, he also has a elevated paraprotein. Total bilirubin is normal. Questionable possibility of multiple myeloma. He doesn't have accompanying chronic renal failure, and his corrected calcium level for hypoalbuminemia is normal at 9.2.   Plan. - Order GGT. -SPEP and UPEP.  # Microcystic Anemia: The etiology is unclear but chronic blood loss and malignancy (?MM) can not be excluded. However, his 3 prior FOBT is in  2013, were negative. Patient has no history of colonoscopies.   Plan. -Anemia panel. -FOBT -Will consider GI follow up as outpatient for colonoscopy EGD.  Disposition is deferred at this time, awaiting improvement of current medical problems.  Anticipated discharge in approximately 2 day(s).   The patient does have a current PCP Redmond Pulling, Alex, DO), therefore is requiring OPC follow-up after discharge.   The patient does not have transportation limitations that hinder transportation to clinic appointments.  .Services Needed at time of discharge: Y = Yes, Blank = No PT:   OT:   RN:   Equipment:   Other:     LOS: 5 days   Jessee Avers PGY2 - Internal Medicine Teaching Service Pager: [phone] 01/11/2014, 1:55 PM

## 2014-01-11 NOTE — Consult Note (Signed)
Joshua [name] 09-Jan-1951  [id number].   Requesting MD: Dr. Scharlene Gloss Chief Complaint/Reason for Consult: cholecystitis HPI: This is a 63 yo [place] male who has multiple medical problems, who was admitted for left foot cellulitis and infection.  He then had AMS with hypotension and was transferred to the ICU where he seized and required intubation.  He was then noted to have bilateral peroneal DVTs.  He was placed on a Heparin drip.  During this time, he also c/o some RUQ abdominal pain.  An [place] was ordered which revealed gb wall thickness up to 1.1cm with some pericholecystic fluid.  No gallstones were seen.    The patient states over the last 6 months he has been having RUQ abdominal pain that is present almost all of the time, but worse after he eats.  He denies any nausea/vomiting/diarrhea.  He denies fevers.  Due to the above findings, we have been asked to evaluate him for further recommendations.  ROS: Please see HPI, otherwise he c/o SOB, he has a foley in place, denies chest pain.  He admits to DM and wound on his left foot.  Otherwise all other systems are negative  No family history on file.  Past Medical History  Diagnosis Date  . 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 Hypertension 10/14/2012  . Type 2 diabetes mellitus   . Diabetic nephropathy with proteinuria   . 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 Knee arthroscopy  2010    3 times, right knee    Social History:  reports that he quit smoking about 21 years ago. His smoking use included Cigarettes. He smoked 0.00 packs per day for 20 years. He has never used smokeless tobacco. He reports that he does not drink alcohol or use illicit drugs.  Allergies: No Known Allergies  Medications Prior to Admission  Medication Sig Dispense Refill  . lisinopril (PRINIVIL,ZESTRIL) 40 MG tablet Take 1 tablet (40 mg total) by mouth daily.  30 tablet  2  .  metFORMIN (GLUCOPHAGE) 500 MG tablet Take 2 tablets (1,000 mg total) by mouth 2 (two) times daily with a meal.  60 tablet  2  . verapamil (CALAN-SR) 180 MG CR tablet Take 1 tablet (180 mg total) by mouth daily.  30 tablet  2  . [DISCONTINUED] hydrochlorothiazide (MICROZIDE) 12.5 MG capsule Take 12.5 mg by mouth daily.      Marland Kitchen glucose monitoring kit (FREESTYLE) monitoring kit 1 each by Does not apply route as needed for other.  1 each      Blood pressure 149/80, pulse 88, temperature 97.8 F (36.6 C), temperature source Oral, resp. rate 18, height '5\' 6"'  (1.676 m), weight 151 lb 0.2 oz (68.5 kg), SpO2 98.00%. Physical Exam: General: pleasant, WD, WN black male who is sitting up in his chair in NAD HEENT: head is normocephalic, atraumatic.  Sclera are noninjected.  PERRL.  Ears and nose without any masses or lesions.  Mouth is pink and moist Heart: regular, rate, and rhythm.  Normal s1,s2. No obvious murmurs, gallops, or rubs noted.  Palpable radial and pedal pulses bilaterally Lungs: CTAB, no wheezes, rhonchi, or rales noted.  He is using some accessory abdominal muscles with breathing and has [place] in place to assist with SOB Abd: soft, mildly tender in RUQ, ND, +BS, no masses, hernias, or organomegaly MS: all 4 extremities are symmetrical with no cyanosis, clubbing, or edema.  Left foot with  dressing in place. Skin: warm and dry with no masses, lesions, or rashes Psych: A&Ox3 with an appropriate affect.    Results for orders placed during the hospital encounter of 01/06/14 (from the past 48 hour(s))  GLUCOSE, CAPILLARY     Status: Abnormal   Collection Time    01/09/14  5:15 PM      Result Value Ref Range   Glucose-Capillary 133 (*) 70 - 99 mg/dL   Comment 1 Notify RN    GLUCOSE, CAPILLARY     Status: Abnormal   Collection Time    01/09/14  9:59 PM      Result Value Ref Range   Glucose-Capillary 195 (*) 70 - 99 mg/dL  GLUCOSE, CAPILLARY     Status: Abnormal   Collection Time    01/10/14   1:28 AM      Result Value Ref Range   Glucose-Capillary 168 (*) 70 - 99 mg/dL  GLUCOSE, CAPILLARY     Status: Abnormal   Collection Time    01/10/14  3:52 AM      Result Value Ref Range   Glucose-Capillary 145 (*) 70 - 99 mg/dL  HEPARIN LEVEL (UNFRACTIONATED)     Status: Abnormal   Collection Time    01/10/14  4:40 AM      Result Value Ref Range   Heparin Unfractionated 0.15 (*) 0.30 - 0.70 IU/mL   Comment:            IF HEPARIN RESULTS ARE BELOW     EXPECTED VALUES, AND PATIENT     DOSAGE HAS BEEN CONFIRMED,     SUGGEST FOLLOW UP TESTING     OF ANTITHROMBIN III LEVELS.  BASIC METABOLIC PANEL     Status: Abnormal   Collection Time    01/10/14  4:40 AM      Result Value Ref Range   Sodium 139  137 - 147 mEq/L   Potassium 3.7  3.7 - 5.3 mEq/L   Chloride 105  96 - 112 mEq/L   CO2 22  19 - 32 mEq/L   Glucose, Bld 154 (*) 70 - 99 mg/dL   BUN 22  6 - 23 mg/dL   Creatinine, Ser 1.53 (*) 0.50 - 1.35 mg/dL   Calcium 8.1 (*) 8.4 - 10.5 mg/dL   GFR calc non Af Amer 47 (*) >90 mL/min   GFR calc Af Amer 55 (*) >90 mL/min   Comment: (NOTE)     The eGFR has been calculated using the CKD EPI equation.     This calculation has not been validated in all clinical situations.     eGFR's persistently <90 mL/min signify possible Chronic Kidney     Disease.  CBC     Status: Abnormal   Collection Time    01/10/14  4:40 AM      Result Value Ref Range   WBC 20.7 (*) 4.0 - 10.5 K/uL   RBC 3.48 (*) 4.22 - 5.81 MIL/uL   Hemoglobin 8.2 (*) 13.0 - 17.0 g/dL   HCT 25.7 (*) 39.0 - 52.0 %   MCV 73.9 (*) 78.0 - 100.0 fL   MCH 23.6 (*) 26.0 - 34.0 pg   MCHC 31.9  30.0 - 36.0 g/dL   RDW 17.9 (*) 11.5 - 15.5 %   Platelets 446 (*) 150 - 400 K/uL  GLUCOSE, CAPILLARY     Status: Abnormal   Collection Time    01/10/14  7:42 AM      Result Value Ref  Range   Glucose-Capillary 145 (*) 70 - 99 mg/dL  GLUCOSE, CAPILLARY     Status: Abnormal   Collection Time    01/10/14 11:35 AM      Result Value Ref  Range   Glucose-Capillary 115 (*) 70 - 99 mg/dL  HEPARIN LEVEL (UNFRACTIONATED)     Status: Abnormal   Collection Time    01/10/14  2:40 PM      Result Value Ref Range   Heparin Unfractionated 0.26 (*) 0.30 - 0.70 IU/mL   Comment:            IF HEPARIN RESULTS ARE BELOW     EXPECTED VALUES, AND PATIENT     DOSAGE HAS BEEN CONFIRMED,     SUGGEST FOLLOW UP TESTING     OF ANTITHROMBIN III LEVELS.  GLUCOSE, CAPILLARY     Status: Abnormal   Collection Time    01/10/14  4:53 PM      Result Value Ref Range   Glucose-Capillary 152 (*) 70 - 99 mg/dL  PROTIME-INR     Status: None   Collection Time    01/10/14  7:20 PM      Result Value Ref Range   Prothrombin Time 13.8  11.6 - 15.2 seconds   INR 1.08  0.00 - 1.49  GLUCOSE, CAPILLARY     Status: Abnormal   Collection Time    01/10/14 10:00 PM      Result Value Ref Range   Glucose-Capillary 169 (*) 70 - 99 mg/dL  PROTIME-INR     Status: Abnormal   Collection Time    01/11/14  6:05 AM      Result Value Ref Range   Prothrombin Time 15.5 (*) 11.6 - 15.2 seconds   INR 1.26  0.00 - 7.62  BASIC METABOLIC PANEL     Status: Abnormal   Collection Time    01/11/14  6:05 AM      Result Value Ref Range   Sodium 140  137 - 147 mEq/L   Potassium 3.7  3.7 - 5.3 mEq/L   Chloride 104  96 - 112 mEq/L   CO2 22  19 - 32 mEq/L   Glucose, Bld 145 (*) 70 - 99 mg/dL   BUN 23  6 - 23 mg/dL   Creatinine, Ser 1.77 (*) 0.50 - 1.35 mg/dL   Calcium 8.4  8.4 - 10.5 mg/dL   GFR calc non Af Amer 39 (*) >90 mL/min   GFR calc Af Amer 46 (*) >90 mL/min   Comment: (NOTE)     The eGFR has been calculated using the CKD EPI equation.     This calculation has not been validated in all clinical situations.     eGFR's persistently <90 mL/min signify possible Chronic Kidney     Disease.  CBC     Status: Abnormal   Collection Time    01/11/14  6:05 AM      Result Value Ref Range   WBC 23.1 (*) 4.0 - 10.5 K/uL   RBC 3.41 (*) 4.22 - 5.81 MIL/uL   Hemoglobin 8.0 (*)  13.0 - 17.0 g/dL   HCT 25.3 (*) 39.0 - 52.0 %   MCV 74.2 (*) 78.0 - 100.0 fL   MCH 23.5 (*) 26.0 - 34.0 pg   MCHC 31.6  30.0 - 36.0 g/dL   RDW 18.0 (*) 11.5 - 15.5 %   Platelets 420 (*) 150 - 400 K/uL  HEPARIN LEVEL (UNFRACTIONATED)     Status: None  Collection Time    01/11/14  6:05 AM      Result Value Ref Range   Heparin Unfractionated 0.37  0.30 - 0.70 IU/mL   Comment:            IF HEPARIN RESULTS ARE BELOW     EXPECTED VALUES, AND PATIENT     DOSAGE HAS BEEN CONFIRMED,     SUGGEST FOLLOW UP TESTING     OF ANTITHROMBIN III LEVELS.  GLUCOSE, CAPILLARY     Status: Abnormal   Collection Time    01/11/14  7:58 AM      Result Value Ref Range   Glucose-Capillary 131 (*) 70 - 99 mg/dL   Comment 1 Documented in Chart     Comment 2 Notify RN    HEPATIC FUNCTION PANEL     Status: Abnormal   Collection Time    01/11/14 11:15 AM      Result Value Ref Range   Total Protein 7.3  6.0 - 8.3 g/dL   Albumin 1.6 (*) 3.5 - 5.2 g/dL   AST 36  0 - 37 U/L   ALT 66 (*) 0 - 53 U/L   Alkaline Phosphatase 236 (*) 39 - 117 U/L   Total Bilirubin 0.4  0.3 - 1.2 mg/dL   Bilirubin, Direct 0.2  0.0 - 0.3 mg/dL   Indirect Bilirubin 0.2 (*) 0.3 - 0.9 mg/dL  GLUCOSE, CAPILLARY     Status: Abnormal   Collection Time    01/11/14 12:10 PM      Result Value Ref Range   Glucose-Capillary 156 (*) 70 - 99 mg/dL   Comment 1 Documented in Chart     Comment 2 Notify RN     No results found.     Assessment/Plan 1. Suspect acalculous cholecystitis Patient Active Problem List   Diagnosis Date Noted  . Acute acalculous cholecystitis 01/11/2014  . Sepsis 01/08/2014  . Cellulitis 01/07/2014  . Acute respiratory failure 01/07/2014  . Acute encephalopathy 01/07/2014  . Shock 01/07/2014  . New onset seizure 01/07/2014  . Acute renal failure 01/07/2014  . Foot ulcer, left 01/06/2014  . Dyspnea 01/05/2014  . Chronic leg pain 01/05/2014  . Puncture wound of sole of left foot without complication  37/34/2876  . Bilateral lower extremity edema 04/01/2013  . Laceration of toe 03/22/2013  . Diabetic neuropathy 03/22/2013  . Cardiovascular risk factor 03/19/2013  . Abnormality of gait 02/27/2013  . CKD, stage 1 02/05/2013  . 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 Diabetic proliferative retinopathy 12/22/2012  . Intragel vitreous hemorrhage 12/22/2012  . Financial difficulties 11/02/2012  . Diabetes mellitus with renal manifestation 10/15/2012  . 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 Diabetic nephropathy with proteinuria 10/15/2012  . High risk sexual behavior 10/14/2012  . Hypertension 10/14/2012  . Venous stasis dermatitis 10/07/2012  . Routine health maintenance 10/07/2012  . Pulmonary tuberculosis 10/07/2012   Plan: 1. Will get a HIDA scan to evaluate for chronic vs acute cholecystitis.  If this is acute cholecystitis then we have a couple of options as he is not a surgical candidate at this time given all of his medical issues right now.  We could try and treat this with abx therapy alone or we could place a percutaneous cholecystostomy drain.  I have discussed these options with the patient and he understand.  Will await HIDA scan results tomorrow to determine further plans.  Caden Fukushima E 01/11/2014, 12:55 PM Pager: [phone]

## 2014-01-11 NOTE — Consult Note (Signed)
PHARMACY NOTE  Pharmacy Consult :  63 y.o. male is currently on Coumadin with Heparin bridging for Bilateral DVT's. Antibiotics changed to Unasyn for Diabetic Foot Wound following I & D today.  Patient Wt:  68.5 kg   Latest Labs : Hematology :  Recent Labs  01/08/14 2007 01/09/14 0428 01/10/14 0440 01/10/14 1440 01/10/14 1920 01/11/14 0605  HGB  --  7.9* 8.2*  --   --  8.0*  HCT  --  24.5* 25.7*  --   --  25.3*  PLT  --  415* 446*  --   --  420*  LABPROT  --   --   --   --  13.8 15.5*  INR  --   --   --   --  1.08 1.26  HEPARINUNFRC 0.52 0.43 0.15* 0.26*  --  0.37  CREATININE  --  1.52* 1.53*  --   --  1.77*   Estimated Creatinine Clearance: 39 ml/min (by C-G formula based on Cr of 1.77).   Lab Results  Component Value Date   INR 1.26 01/11/2014   INR 1.08 01/10/2014        HEPARINUNFRC 0.37 01/11/2014   HEPARINUNFRC 0.26* 01/10/2014   HEPARINUNFRC 0.15* 01/10/2014        HGB 8.0* 01/11/2014   HGB 8.2* 01/10/2014   HGB 7.9* 01/09/2014   97.8 F (36.6 C) (Oral)   Lab Results  Component Value Date   WBC 23.1* 01/11/2014     Current Medication[s] Include: Scheduled:  Scheduled:  . ampicillin-sulbactam (UNASYN) IVPB 3 g  3 g Intravenous Q8H  . carvedilol  6.25 mg Oral BID WC  . coumadin book   Does not apply Once  . furosemide  40 mg Oral Daily  . insulin aspart  0-9 Units Subcutaneous TID WC  . levETIRAcetam  500 mg Intravenous Q12H  . pantoprazole  20 mg Oral QAC breakfast  . verapamil  120 mg Oral Daily  . warfarin  7.5 mg Oral ONCE-1800  . warfarin   Does not apply Once  . Warfarin - Pharmacist Dosing Inpatient   Does not apply q1800   Infusion[s]: Infusions:  . sodium chloride 20 mL/hr (01/10/14 0537)  . heparin 1,750 Units/hr (01/10/14 1655)   Antibiotic[s]: Anti-infectives   Start     Dose/Rate Route Frequency Ordered Stop   01/11/14 1015  Ampicillin-Sulbactam (UNASYN) 3 g in sodium chloride 0.9 % 100 mL IVPB     3 g 100 mL/hr over 60 Minutes  Intravenous Every 8 hours 01/11/14 1003     01/10/14 2200  amoxicillin-clavulanate (AUGMENTIN) 875-125 MG per tablet 1 tablet  Status:  Discontinued     1 tablet Oral Every 12 hours 01/10/14 1817 01/11/14 0904   01/08/14 1300  piperacillin-tazobactam (ZOSYN) IVPB 3.375 g  Status:  Discontinued     3.375 g 12.5 mL/hr over 240 Minutes Intravenous Every 8 hours 01/08/14 1206 01/10/14 1817   01/07/14 1730  linezolid (ZYVOX) IVPB 600 mg  Status:  Discontinued    Comments:  sepsis   600 mg 300 mL/hr over 60 Minutes Intravenous Every 12 hours 01/07/14 1705 01/10/14 1817   01/07/14 1600  vancomycin (VANCOCIN) IVPB 1000 mg/200 mL premix  Status:  Discontinued     1,000 mg 200 mL/hr over 60 Minutes Intravenous STAT 01/07/14 1530 01/07/14 1800   01/07/14 1600  piperacillin-tazobactam (ZOSYN) IVPB 3.375 g     3.375 g 12.5 mL/hr over 240 Minutes Intravenous STAT 01/07/14 1530 01/07/14 2100  Assessment :  Day # 5 of Antibiotics for Diabetic Foot infection.  S/P Vancomycin, Zosyn, Zyvox.  Unasyn to be started today.  WBC trending up, 23.1 today.  Patient is Afebrile.   Today's INR is 1.26 following single Coumadin dose.   INR is Subtherapeutic.    Heparin level is 0.37 units/ml on Heparin rate of 1750 units/hr.  No bleeding complications observed.  Goal :  INR goal is 2-3    Heparin goal is Heparin level 0.3-0.7 units/ml.  Eradication of Foot infection. Unasyn selected for infection/cultures and adjusted for renal function.   Plan : 1. Heparin will be continued until INR therapeutic for 48 hours after minimum of 5 day overlap of Heparin and Coumadin.   The next Heparin Level will be due with AM Labs. 2. Repeat Coumadin 7.5 mg today. 3. Daily Heparin level, INR, CBC, and Monitor for bleeding complications. 4. Coumadin discharge education completed. 5. Begin Unasyn 3 gm IV q 8 hours. 6. Follow up SCr, UOP, cultures, clinical course and adjust as clinically indicated.   Pamala Hayman,  Craig Guess, Pharm.D. 01/11/2014  10:04 AM

## 2014-01-11 NOTE — Progress Notes (Signed)
Medical Student Daily Progress Note  Subjective: No events overnight. Patient reports having some SOB, but overall doing well. He is able to urinate well.   Orthopedic consult recommended debridement of wound and IV antibiotics treatment. They will pack wound to prevent abscess formation.   Brief patient description:  Joshua [name] is a 63 yo [place] man with HTN, DM2, previous treatment for extrapulmonary TB admitted 2/12 to IMTS with Joshua [name] ulcer with increasing pain and chills for treatment of infected DM [name] ulcer/possible osteomyelitis. However, his hospital course was complicated by AMS and acute respiratory failure on evening of 2/13 and was transferred to ICU where he received intubation after he had a witnessed seizure. Multiple studies were done as listed below, and patient was transferred back to IMTS on 2/16.   SIGNIFICANT EVENTS / STUDIES:  2/12 Admitted to IMTS with dx of infected diabetic [name] ulcer, Joshua/O osteromyelitis  2/13 Transferred to ICU with AMS, hypotension, bradycardia after verapamil  2/13 Witnessed seizure after transfer to ICU  2/13 LE venous duplex: positive for bilat peroneal DVTs  2/13 CT head >>> nad  2/13 CT chest >>> Bilateral effusions, mild edema, "cut off sign" RLL bronchus, non-specific adenopathy  2/14 Bronchoscopy >>> no endobronchial lesions, severe tracheobronchomalacia  2/14 Venous Doppler LE >>> Bilateral acute DVT  2/14 RUQ [place] >>> Findings are worrisome for acute acalculous cholecystitis (1.1cm gallbladder wall thickening)  2/14 Liver Doppler >>> no evidence of portal vein thrombosis or hepatic veno-occlusive disease  2/14 TTE >>> EF 55, grade 2 diastolic dysfunction, mildly dilated RV with moderately reduced function, PAP 49 torr  2/15 V/Q scan >>> no PE, very low probability for pulmonary embolism  2/15 MRI of Left [name] >>> no osteo, diffuse cellulitis, myositis, small 1st MTP joint effusion that is likely aseptic and incidental, osteonecrosis of  first MTP joint   LINES / TUBES:  OETT 2/13 >>> 2/14  OGT 2/13 >>> 2/14  Joshua [place] CVL 2/13 >>> 2/17  Foley 2/13 >>> 2/16   CULTURES:  Blood 2/13 >>> 1 out of 2 positive for Staphylococcus species, coagulase negative   ANTIBIOTICS:  Vanc 2/12 >>> 2/13  Linezolid 2/13 >>> 2/16  Pip-tazo 2/13 >>> 2/16  Augmentin 2/16 >>> 2/17 Unasyn 2/17 >>>    Objective: Vital signs in last 24 hours: Filed Vitals:   01/10/14 0425 01/10/14 2054 01/10/14 2300 01/11/14 0500  BP: 142/76 186/98 174/88 149/80  Pulse: 79 84  88  Temp: 98.5 F (36.9 C) 97.7 F (36.5 C)  97.8 F (36.6 C)  TempSrc:  Oral  Oral  Resp: '20 18  18  ' Height:      Weight:    68.5 kg (151 lb 0.2 oz)  SpO2: 100% 99%  98%   Weight change: -0.039 kg (-1.4 oz)  Intake/Output Summary (Last 24 hours) at 01/11/14 1102 Last data filed at 01/11/14 0017  Gross per 24 hour  Intake    630 ml  Output    450 ml  Net    180 ml   Physical Exam: BP 149/80  Pulse 88  Temp(Src) 97.8 F (36.6 C) (Oral)  Resp 18  Ht '5\' 6"'  (1.676 m)  Wt 68.5 kg (151 lb 0.2 oz)  BMI 24.39 kg/m2  SpO2 98% General appearance: alert, cooperative and no distress Neck: JVD - 6 cm above sternal notch Lungs: clear to auscultation bilaterally Heart: Either a S1 split or a S4 gallop heard, 2+ radial pulses, no murmurs  Abdomen: Tympanic to percussion,  soft, slightly distended. Extremities: Joshua [name]: wrapped in gaze bandage, but has a 1cmx21m deep ulcer on plantar surface below large toe; Joshua [name], scaling on toes; 1+ edema in [name] bilaterally, improved from admission Pulses: 2+ and symmetric Lab Results: Lab Results  Component Value Date   CREATININE 1.77* 01/11/2014   BUN 23 01/11/2014   NA 140 01/11/2014   K 3.7 01/11/2014   CL 104 01/11/2014   CO2 22 01/11/2014   Lab Results  Component Value Date   CALCIUM 8.4 01/11/2014   CBC    Component Value Date/Time   WBC 23.1* 01/11/2014 0605   RBC 3.41* 01/11/2014 0605   HGB 8.0* 01/11/2014 0605   HCT 25.3*  01/11/2014 0605   PLT 420* 01/11/2014 0605   MCV 74.2* 01/11/2014 0605   MCH 23.5* 01/11/2014 0605   MCHC 31.6 01/11/2014 0605   RDW 18.0* 01/11/2014 0605   LYMPHSABS 1.5 01/05/2014 1616   MONOABS 1.5* 01/05/2014 1616   EOSABS 0.1 01/05/2014 1616   BASOSABS 0.0 01/05/2014 1616   Lab Results  Component Value Date   INR 1.26 01/11/2014   INR 1.08 01/10/2014   Glucose: 145   Micro Results: Recent Results (from the past 240 hour(s))  CULTURE, BLOOD (ROUTINE X 2)     Status: None   Collection Time    01/07/14  8:15 AM      Result Value Ref Range Status   Specimen Description BLOOD LEFT ARM   Final   Special Requests BOTTLES DRAWN AEROBIC AND ANAEROBIC 10CC   Final   Culture  Setup Time     Final   Value: 01/07/2014 14:02     Performed at [facility]  Culture     Final   Value: STAPHYLOCOCCUS SPECIES (COAGULASE NEGATIVE)     Note: THE SIGNIFICANCE OF ISOLATING THIS ORGANISM FROM A SINGLE SET OF BLOOD CULTURES WHEN MULTIPLE SETS ARE DRAWN IS UNCERTAIN. PLEASE NOTIFY THE MICROBIOLOGY DEPARTMENT WITHIN ONE WEEK IF SPECIATION AND SENSITIVITIES ARE REQUIRED.     Note: Gram Stain Report Called to,Read Back By and Verified With: [facility]2/14/15 @ 5:56PM BY RUSCOE A.     Performed at [facility]  Report Status 01/09/2014 FINAL   Final  CULTURE, BLOOD (ROUTINE X 2)     Status: None   Collection Time    01/07/14  8:30 AM      Result Value Ref Range Status   Specimen Description BLOOD LEFT ARM   Final   Special Requests BOTTLES DRAWN AEROBIC AND ANAEROBIC 10CC   Final   Culture  Setup Time     Final   Value: 01/07/2014 14:02     Performed at [facility]  Culture     Final   Value:        BLOOD CULTURE RECEIVED NO GROWTH TO DATE CULTURE WILL BE HELD FOR 5 DAYS BEFORE ISSUING A FINAL NEGATIVE REPORT     Performed at [facility]  Report Status PENDING   Incomplete   Studies/Results: [name] Left W Wo Contrast  01/09/2014   CLINICAL DATA:   Nonhealing wound on the plantar surface of the [name] the first MTP joint in the diabetic patient.  EXAM: MRI OF THE LEFT FOREFOOT WITHOUT AND WITH CONTRAST  TECHNIQUE: Multiplanar, multisequence MR imaging was performed both before and after administration of intravenous contrast.  CONTRAST:  15 mL MULTIHANCE GADOBENATE DIMEGLUMINE 529 MG/ML IV SOLN  COMPARISON:  Plain  films of the left [name] 01/05/2014.  FINDINGS: There is a skin ulceration on the plantar surface the [name] at the level the first MTP joint. Extensive subcutaneous edema and enhancement are present about the [name] consistent with cellulitis. No bone marrow signal abnormality to suggest osteomyelitis is seen. The medial sesamoid bone demonstrates decreased signal on both T1 and T2 weighted images without postcontrast enhancement likely due to chronic osteonecrosis. All visualized intrinsic musculature of the [name] demonstrates increased T2 signal. There is postcontrast enhancement in the flexor hallucis brevis muscle. The patient has a small first MTP joint effusion.  IMPRESSION: Skin ulceration on the plantar surface of the [name] at the first MTP joint without underlying abscess or osteomyelitis. Changes of diffuse cellulitis are identified. Edema and enhancement in the flexor hallucis brevis muscle are compatible with myositis.  Small first MTP joint effusion is likely aseptic and incidental.  Appearance of the medial sesamoid bone of the first MTP joint most consistent with chronic osteonecrosis.   Electronically Signed   By: Inge Rise M.D.   On: 01/09/2014 13:06   Nm Pulmonary Perf And Vent  01/09/2014   CLINICAL DATA:  Evaluate for pulmonary embolism  EXAM: NUCLEAR MEDICINE VENTILATION - PERFUSION LUNG SCAN  TECHNIQUE: Ventilation images were obtained in multiple projections using inhaled aerosol technetium 99 M DTPA. Perfusion images were obtained in multiple projections after intravenous injection of Tc-6mMAA.  COMPARISON:  DG CHEST 1V  PORT dated 01/08/2014  RADIOPHARMACEUTICALS:  40 mCi Tc-960mTPA aerosol and 6 mCi Tc-9959mA  FINDINGS: Review of chest radiograph performed and prior demonstrates enlarged cardiac silhouette and mediastinal contours. Endotracheal tube overlies the tracheal air column with tip superior to the carina. There are bibasilar heterogeneous opacities, left greater than right. Small left-sided pleural effusion. Mild pulmonary venous congestion without definite evidence of edema.  Ventilation: There is clumping of inhaled radiotracer about the bilateral pulmonary hila. There is otherwise adequate ventilation of the bilateral pulmonary parenchyma. There is ingested radiotracer seen within the hypopharynx, stomach and proximal small bowel.  Perfusion: There is a linear area of relative oligemia about the right major fissure secondary to known partially layering pleural effusion. There is otherwise relative homogeneous distribution of injected radiotracer throughout the pulmonary parenchyma. There are no discrete mismatched segmental or subsegmental filling defects to suggest pulmonary embolism.  IMPRESSION: Pulmonary embolism absent (very low probability for pulmonary embolism).   Electronically Signed   By: JohSandi MariscalD.   On: 01/09/2014 11:44   Medications: I have reviewed the patient's current medications. Scheduled Meds: . ampicillin-sulbactam (UNASYN) IVPB 3 g  3 g Intravenous Q8H  . carvedilol  6.25 mg Oral BID WC  . coumadin book   Does not apply Once  . furosemide  40 mg Oral Daily  . insulin aspart  0-9 Units Subcutaneous TID WC  . levETIRAcetam  500 mg Intravenous Q12H  . pantoprazole  20 mg Oral QAC breakfast  . verapamil  120 mg Oral Daily  . warfarin  7.5 mg Oral ONCE-1800  . warfarin   Does not apply Once  . Warfarin - Pharmacist Dosing Inpatient   Does not apply q1800   Continuous Infusions: . sodium chloride 20 mL/hr (01/10/14 0537)  . heparin 1,750 Units/hr (01/10/14 1655)   PRN  Meds:.labetalol, sodium chloride Assessment/Plan: Principal Problem:   Cellulitis Active Problems:   Hypertension   Diabetes mellitus with renal manifestation   Acute respiratory failure   Acute encephalopathy   Shock   New  onset seizure   Acute renal failure   Sepsis   Acute acalculous cholecystitis   LOS: 5 days   Joshua [name] is a 63yo man with history of T2DM, HTN, Joshua [name] ulcer, previously treated extrapulmonary TB who has returned to IMTS from ICU with sepsis possibly due to acalculous cholecystitis, elevated alkaline phophatase, AKI, nephrotic syndrome, dCHF, anemia and acute DVT w/o PE. He likely has more systemic involvement causing all of these symptoms than what we think.  # Septic shock likely due to acalculous cholecystitis: Multiple etiologies have been worked up and most likely cause include acute acalculous cholecystitis, non-coagulase staph bacteremia, acute DVT, and LLE ulcer. This event also occurred in the setting of patient using verapamil. Acalculous cholecystitis was found on RUQ ultrasound with gallbladder wall thickening of 1.1cm. Patient has had consistently elevated alkaline phosphate since November, but only slightly elevated AST and ALTs. He has reported a 6 month history of RUQ pain after eating, but did not report it to the doctor. However, after patient's acute respiratory failure, AST (158) and ALT (128) were both elevated. Patient consistently had low albumin (1.3-1.7). TBili was normal. The ICU setting can lead to acute acalculous cholecystitis, but the 80-monthlong symptom with consistently elevated alk phos suggests an underlying gallbladder disorder, most likely cholestasis from DM. Peptic ulcers are possible given the history, but please see below for management. As for septic causes, one out of two blood cultures on admission shows non-coagulase staph species that could have caused sepsis, but patient is now treated adequately with antibiotics. Acute DVT was  suspected, but V/Q scan showed low probability of PE. MRI of LLE shows no osteomyelitis, thus ruling out infectious source there.   Plan  - Surgery consult appreciated cholecystectomy as gallbladder wall thickening to 1.1cm w/o cholelithiasis is diagnostic for acalculous cholecystitis: surgery is the definitive treatment in patients who can undergo surgery - Stop oral Augmentin  - Start IV Unasyn 3g IV q6H to cover gram negative organisms and anaerobes  - PPI 273mPO qD   # AKI: Cr trending from 0.8 on admission to 1.8 while in hospital. BUN/Cr ratio < 20, and proteinuria indicating an intra-renal cause of AKI, but FE urea is pending for more definitive diagnosis. Pre-renal causes include the current resolving sepsis, dCHF, and possible liver cause given the hypoalbuminemia (thought unlikely due to no evidence of hepatitis seen on liver ultrasound). Renal causes include possible new diabetic nephropathy given patient's history of diabetes, presence of granular cast on UA, nephrotic syndrome, and hypercoagulability as demonstrated by acute DVT. Multiple myeloma is also possible given patient's presentation of elevated alkaline phosphatase, large difference between total protein and albumin (5.3), anemia, occasional back pain, and AKI.  -Urine urea and urine creatinine -Urine analysis -Renal scan for evaluation of kidney sizes.  # Nephrotic syndrome: Patient has proteinuria with urine microalbumin of 416.6558mL, hypoalbuminemia (1.7), hypercoagulability (acute DVT), pleural effusion, and fluid overload presentation. There are also granular casts on UA. Differential include diabetic nephropathy due to patient's history, or other glomerulonephropathies. Patient has signs of intra-renal causes given the granular cast, and is waiting on work up for AKI. -Urine protein/creatinine ratio pending -Waiting on AKI work up  # Elevated Alkaline Phosphatase: Two sources of elevated alkaline phosphatase are bone  and liver. GGT should help us [place] differentiate between bone or liver as the cause of this elevation. Patient is predisposed to have liver dysfunction due to recently diagnosed acalculous cholecystitis. Liver is normal on ultrasound though.  However, Patient could also have bone break down in the setting of multiple myeloma. Evidence for multiple myeloma includes AKI, elevated alkaline phosphate elves, anemia, and some complaints of bone back in lumbar and cervical spine. However, Ca (8.4 --> corrected to 9.6) is not elevated.  -GGT level -UPEP, SPEP  # Hypoalbuminemia:  Patient has albumin of 1.7 with protein gap of 5.3 from total protein. Patient has a baseline hypoalbuminemia. This is concerning for paraprotein phenomenon, concerning for multiple myeloma given patient's anemia, AKI, and elevated alkaline phosphate. Patient does not appear to have liver disease given normal liver appearance on ultrasound and normal AST/ALT.  -UPEP, SPEP follow-up  # dCHF: patient presents with bilateral pleural effusion on CT, peripheral edema, JVD, SOB, ARF (Cr 1.75 on 2/13, but now 1.54 on 2/16), EF of 55, and grade 2 diastolic dysfunction on ECHO. Patient is +119m fluid positive from the day before and is 9.2L fluid positive since admission. SOB is improving with furosemide, but Cr has increased to 1.77 from 1.54 today. Patient is able to make urine  - increased Verapamil to 123mdaily  - Add Coreg 6.25 mg bid - may titrate the dose if the BP cont to remain elevated  - hold off ACEI with AKI  - Furosemide 4017mO daily  - Follow up on Renal Labs  - will aim for negative or even fluid balance. He is still positive since admission   # Acute DVT: Patient was found to have bilateral acute DVT without PE. Therapeutic on heparin with UFH of 0.37 and goal of 0.3-0.7. UFH at desired dose per anticoagulation consult recommendation. Warfarin add on will be managed by anticoagulation team. If patient undergoes surgery,  anticoagulation team will be notified to manage anticoagulation  -Anticoagulation team recs appreciated  - on Hep gtt  - start coumadin per pharmacy  - will likely require anticoagulants for three months for first time DVT   # Microcytic Anemia: hemoglobin of 8.2 in 2015, but 13.9 in 2010. Hgb is stable at 8-9. Patient has been having RUQ pain with dark stools for 6 months or more. Likely causes include peptic ulcer bleed and lower GI bleed, nephropathy, and multiple myeloma. No NSAIDs were used at home. No colonoscopy on file, but there was 3 negative FOBT in 10/2012. -FOBT needed prior to outpatient recommendations/GI consult inpatient -Protonix 78m32m qD: upper GI bleed and RUQ pain after eating  -Iron panel f/u -Preventive colonoscopy need to be scheduled  -EGD may be needed depending on work up   # Diabetic [name] ulcer: MRI of Joshua [name] did not show osteomyelitis, but did show local cellulitis and possible myositis. Ortho performed debridement, wound culture, and possible IV antibiotics. -f/u wound debridement and wound culture -Unasyn IV should cover typical species found in wounds  # Seizure: EEG showed medication effect. This is patient's first seizure in the setting of acute respiratory failure. This is most likely an isolated event. Patient does not have a history of substance abuse, and labs shows that electrolytes are in normal ranges.  -Discontinue Keppra  -Neuro check   Proph:  DVT: already on UFH   This is a [employer] information officere.  The care of the patient was discussed with Dr. ComeLinus Salmons the assessment and plan formulated with their assistance.  Please see their attached note for official documentation of the daily encounter.  JianKonrad Saha7/2015, 11:02 AM   I have seen the patient and reviewed the daily progress note by RuiElvin So  MS IV and discussed the care of the patient with them.  Please see my note for my findings, assessment, and plans/additions.   Jessee Avers,  MD PGY-2 Internal Medicine Teaching Service Pager: [phone]

## 2014-01-12 ENCOUNTER — Inpatient Hospital Stay (HOSPITAL_COMMUNITY): Payer: Self-pay

## 2014-01-12 DIAGNOSIS — N179 Acute kidney failure, unspecified: Secondary | ICD-10-CM

## 2014-01-12 DIAGNOSIS — L02619 Cutaneous abscess of unspecified foot: Secondary | ICD-10-CM

## 2014-01-12 DIAGNOSIS — L03119 Cellulitis of unspecified part of limb: Secondary | ICD-10-CM

## 2014-01-12 DIAGNOSIS — K819 Cholecystitis, unspecified: Secondary | ICD-10-CM

## 2014-01-12 DIAGNOSIS — L98499 Non-pressure chronic ulcer of skin of other sites with unspecified severity: Secondary | ICD-10-CM

## 2014-01-12 DIAGNOSIS — I5033 Acute on chronic diastolic (congestive) heart failure: Secondary | ICD-10-CM

## 2014-01-12 DIAGNOSIS — D72829 Elevated white blood cell count, unspecified: Secondary | ICD-10-CM

## 2014-01-12 DIAGNOSIS — K922 Gastrointestinal hemorrhage, unspecified: Secondary | ICD-10-CM | POA: Diagnosis present

## 2014-01-12 DIAGNOSIS — E8809 Other disorders of plasma-protein metabolism, not elsewhere classified: Secondary | ICD-10-CM

## 2014-01-12 DIAGNOSIS — R109 Unspecified abdominal pain: Secondary | ICD-10-CM

## 2014-01-12 LAB — BASIC METABOLIC PANEL
BUN: 30 mg/dL — AB (ref 6–23)
CO2: 23 mEq/L (ref 19–32)
Calcium: 8.5 mg/dL (ref 8.4–10.5)
Chloride: 105 mEq/L (ref 96–112)
Creatinine, Ser: 2.05 mg/dL — ABNORMAL HIGH (ref 0.50–1.35)
GFR, EST AFRICAN AMERICAN: 38 mL/min — AB (ref >90)
GFR, EST NON AFRICAN AMERICAN: 33 mL/min — AB (ref >90)
Glucose, Bld: 123 mg/dL — ABNORMAL HIGH (ref 70–99)
POTASSIUM: 3.5 meq/L — AB (ref 3.7–5.3)
SODIUM: 140 meq/L (ref 137–147)

## 2014-01-12 LAB — CBC
HCT: 22.2 % — ABNORMAL LOW (ref 39.0–52.0)
HCT: 25 % — ABNORMAL LOW (ref 39.0–52.0)
Hemoglobin: 7.1 g/dL — ABNORMAL LOW (ref 13.0–17.0)
Hemoglobin: 8 g/dL — ABNORMAL LOW (ref 13.0–17.0)
MCH: 23.4 pg — ABNORMAL LOW (ref 26.0–34.0)
MCH: 23.4 pg — AB (ref 26.0–34.0)
MCHC: 32 g/dL (ref 30.0–36.0)
MCHC: 32 g/dL (ref 30.0–36.0)
MCV: 73.3 fL — ABNORMAL LOW (ref 78.0–100.0)
MCV: 73.1 fL — ABNORMAL LOW (ref 78.0–100.0)
PLATELETS: 385 10*3/uL (ref 150–400)
PLATELETS: 424 10*3/uL — AB (ref 150–400)
RBC: 3.03 MIL/uL — AB (ref 4.22–5.81)
RBC: 3.42 MIL/uL — ABNORMAL LOW (ref 4.22–5.81)
RDW: 17.8 % — ABNORMAL HIGH (ref 11.5–15.5)
RDW: 17.8 % — AB (ref 11.5–15.5)
WBC: 17.6 10*3/uL — ABNORMAL HIGH (ref 4.0–10.5)
WBC: 19 10*3/uL — ABNORMAL HIGH (ref 4.0–10.5)

## 2014-01-12 LAB — RETICULOCYTES
RBC.: 2.98 MIL/uL — AB (ref 4.22–5.81)
RETIC CT PCT: 0.5 % (ref 0.4–3.1)
Retic Count, Absolute: 14.9 10*3/uL — ABNORMAL LOW (ref 19.0–186.0)

## 2014-01-12 LAB — IRON AND TIBC
IRON: 12 ug/dL — AB (ref 42–135)
Saturation Ratios: 7 % — ABNORMAL LOW (ref 20–55)
TIBC: 181 ug/dL — AB (ref 215–435)
UIBC: 169 ug/dL (ref 125–400)

## 2014-01-12 LAB — CREATININE, URINE, 24 HOUR
COLLECTION INTERVAL-UCRE24: 24 h
CREATININE 24H UR: 1008 mg/d (ref 800–2000)
CREATININE, URINE: 59.27 mg/dL
URINE TOTAL VOLUME-UCRE24: 1700 mL

## 2014-01-12 LAB — PREPARE RBC (CROSSMATCH)

## 2014-01-12 LAB — HEPARIN LEVEL (UNFRACTIONATED): HEPARIN UNFRACTIONATED: 0.53 [IU]/mL (ref 0.30–0.70)

## 2014-01-12 LAB — PROTIME-INR
INR: 1.48 (ref 0.00–1.49)
PROTHROMBIN TIME: 17.5 s — AB (ref 11.6–15.2)

## 2014-01-12 LAB — GLUCOSE, CAPILLARY
Glucose-Capillary: 123 mg/dL — ABNORMAL HIGH (ref 70–99)
Glucose-Capillary: 144 mg/dL — ABNORMAL HIGH (ref 70–99)
Glucose-Capillary: 196 mg/dL — ABNORMAL HIGH (ref 70–99)

## 2014-01-12 LAB — FERRITIN: Ferritin: 179 ng/mL (ref 22–322)

## 2014-01-12 LAB — OCCULT BLOOD X 1 CARD TO LAB, STOOL: Fecal Occult Bld: POSITIVE — AB

## 2014-01-12 LAB — UREA NITROGEN, URINE: Urea Nitrogen, Ur: 302 mg/dL

## 2014-01-12 LAB — ABO/RH: ABO/RH(D): O POS

## 2014-01-12 MED ORDER — FUROSEMIDE 10 MG/ML IJ SOLN
80.0000 mg | Freq: Two times a day (BID) | INTRAMUSCULAR | Status: DC
  Administered 2014-01-12 – 2014-01-14 (×4): 80 mg via INTRAVENOUS
  Filled 2014-01-12 (×9): qty 8

## 2014-01-12 MED ORDER — TECHNETIUM TC 99M MEBROFENIN IV KIT
5.0000 | PACK | Freq: Once | INTRAVENOUS | Status: AC | PRN
  Administered 2014-01-12: 5 via INTRAVENOUS

## 2014-01-12 MED ORDER — WARFARIN - PHARMACIST DOSING INPATIENT: Freq: Every day | Status: DC

## 2014-01-12 MED ORDER — VERAPAMIL HCL ER 120 MG PO TBCR
120.0000 mg | EXTENDED_RELEASE_TABLET | Freq: Every day | ORAL | Status: DC
  Administered 2014-01-12 – 2014-01-21 (×10): 120 mg via ORAL
  Filled 2014-01-12 (×11): qty 1

## 2014-01-12 MED ORDER — ISOSORBIDE MONONITRATE ER 30 MG PO TB24
30.0000 mg | ORAL_TABLET | Freq: Every day | ORAL | Status: DC
  Administered 2014-01-12 – 2014-01-21 (×10): 30 mg via ORAL
  Filled 2014-01-12 (×10): qty 1

## 2014-01-12 MED ORDER — METOLAZONE 5 MG PO TABS
5.0000 mg | ORAL_TABLET | Freq: Once | ORAL | Status: AC
  Administered 2014-01-12: 5 mg via ORAL
  Filled 2014-01-12: qty 1

## 2014-01-12 MED ORDER — WARFARIN SODIUM 7.5 MG PO TABS
7.5000 mg | ORAL_TABLET | Freq: Once | ORAL | Status: AC
  Administered 2014-01-12: 7.5 mg via ORAL
  Filled 2014-01-12: qty 1

## 2014-01-12 MED ORDER — HYDRALAZINE HCL 25 MG PO TABS
25.0000 mg | ORAL_TABLET | Freq: Three times a day (TID) | ORAL | Status: DC
  Administered 2014-01-12 – 2014-01-13 (×3): 25 mg via ORAL
  Filled 2014-01-12 (×6): qty 1

## 2014-01-12 MED ORDER — FERROUS SULFATE 325 (65 FE) MG PO TABS
325.0000 mg | ORAL_TABLET | Freq: Three times a day (TID) | ORAL | Status: DC
  Administered 2014-01-13 – 2014-01-21 (×25): 325 mg via ORAL
  Filled 2014-01-12 (×28): qty 1

## 2014-01-12 MED ORDER — POTASSIUM CHLORIDE CRYS ER 20 MEQ PO TBCR
20.0000 meq | EXTENDED_RELEASE_TABLET | Freq: Two times a day (BID) | ORAL | Status: DC
  Administered 2014-01-12 – 2014-01-17 (×10): 20 meq via ORAL
  Filled 2014-01-12 (×12): qty 1

## 2014-01-12 NOTE — Progress Notes (Signed)
ANTICOAGULATION CONSULT NOTE - Follow Up Consult  Pharmacy Consult for heparin, coumadin  Indication: DVT  Heparin dosing weight: 68.4 kg  Labs:  Recent Labs  01/10/14 0440 01/10/14 1440 01/10/14 1920 01/11/14 0605 01/12/14 0551  HGB 8.2*  --   --  8.0* 7.1*  HCT 25.7*  --   --  25.3* 22.2*  PLT 446*  --   --  420* 385  LABPROT  --   --  13.8 15.5* 17.5*  INR  --   --  1.08 1.26 1.48  HEPARINUNFRC 0.15* 0.26*  --  0.37 0.53  CREATININE 1.53*  --   --  1.77* 2.05*     Assessment: 62yo male remains  heparin and coumadin (day 3 of overlap) for  LLE DVT.  INR today is 1.48 with slight trend up, heparin level = 0.53 (at goal) Hg= 7.1 with trend down and noted FOBT positive.   Goal of Therapy:  Heparin level 0.3-0.7 units/ml INR goal 2-3   Plan:  -Continue coumadin 7.5mg  po today (no increase for now) -No heparin changes but will consider keeping on low end of goal (0.3-0.5) -Daily heparin level, INR and CBC  Hildred Laser, Pharm D 01/12/2014 11:26 AM

## 2014-01-12 NOTE — Progress Notes (Signed)
Medical Student Daily Progress Note  Subjective: Patient continues to have shortness of breath, but not worse than usual.   Patient had some blood loss (50-136m) after his central line was removed, but bleeding was stopped promptly.   Patient had an episode of high blood pressure to 180/96, and it was treated with Hydralazine 13m6H.   SIGNIFICANT EVENTS / STUDIES:  2/12 Admitted to IMTS with dx of infected diabetic foot ulcer, R/O osteromyelitis  2/13 Transferred to ICU with AMS, hypotension, bradycardia after verapamil  2/13 Witnessed seizure after transfer to ICU  2/13 LE venous duplex: positive for bilat peroneal DVTs  2/13 CT head >>> nad  2/13 CT chest >>> Bilateral effusions, mild edema, "cut off sign" RLL bronchus, non-specific adenopathy  2/14 Bronchoscopy >>> no endobronchial lesions, severe tracheobronchomalacia  2/14 Venous Doppler LE >>> Bilateral acute DVT  2/14 RUQ [place] Findings are worrisome for acute acalculous cholecystitis (1.1cm gallbladder wall thickening)  2/14 Liver Doppler >>> no evidence of portal vein thrombosis or hepatic veno-occlusive disease  2/14 TTE >>> EF 55, grade 2 diastolic dysfunction, mildly dilated RV with moderately reduced function, PAP 49 torr  2/15 V/Q scan >>> no PE, very low probability for pulmonary embolism  2/15 MRI of Left foot >>> no osteo, diffuse cellulitis, myositis, small 1st MTP joint effusion that is likely aseptic and incidental, osteonecrosis of first MTP joint  2/18 HIDA scan >>> no abnormalities in hepaticbiliary tree 2/18 FOBT x2 >>> positive  LINES / TUBES:  OETT 2/13 >>> 2/14  OGT 2/13 >>> 2/14  R [place] CVL 2/13 >>> 2/17  Foley 2/13 >>> 2/16   CULTURES:  Blood 2/13 >>> 1 out of 2 positive for Staphylococcus species, coagulase negative   ANTIBIOTICS:  Vanc 2/12 >>> 2/13  Linezolid 2/13 >>> 2/16  Pip-tazo 2/13 >>> 2/16  Augmentin 2/16 >>> 2/17  Unasyn 2/17 >>>    Objective: Vital signs in last 24 hours: Filed  Vitals:   01/11/14 0500 01/11/14 1353 01/11/14 2023 01/12/14 0423  BP: 149/80 181/96 127/61 150/77  Pulse: 88 86 63 69  Temp: 97.8 F (36.6 C) 97.4 F (36.3 C) 98.2 F (36.8 C) 97.4 F (36.3 C)  TempSrc: Oral Oral Oral Oral  Resp: _0 Height:      Weight: 68.5 kg (151 lb 0.2 oz)   68.233 kg (150 lb 6.8 oz)  SpO2: 98% 100% 100% 100%   Weight change: -0.267 kg (-9.4 oz)  Intake/Output Summary (Last 24 hours) at 01/12/14 1155 Last data filed at 01/12/14 0423  Gross per 24 hour  Intake      0 ml  Output   1350 ml  Net  -1350 ml   Physical Exam: BP 150/77  Pulse 69  Temp(Src) 97.4 F (36.3 C) (Oral)  Resp 18  Ht _1  (1.676 m)  Wt 68.233 kg (150 lb 6.8 oz)  BMI 24.29 kg/m2  SpO2 100% General appearance: alert, no distress and patient likes to close his eyes as if he is resting when he is sitting up Neck: JVD - 6 cm above sternal notch Lungs: clear to auscultation bilaterally Heart: Regular rate, either a faint S4 or a minor S1 split less prominent than the day before, no murmurs Abdomen: Soft, slightly distended than on admission, no tenderness to palpation, no murphy's sign Extremities: L: 1cm plantar ulcer below the large toe that is debrided; 2+ pitting edema bilaterally Lab Results: BMET    Component Value Date/Time  NA 140 01/12/2014 0551   K 3.5* 01/12/2014 0551   CL 105 01/12/2014 0551   CO2 23 01/12/2014 0551   GLUCOSE 123* 01/12/2014 0551   BUN 30* 01/12/2014 0551   CREATININE 2.05* 01/12/2014 0551   CREATININE 1.14 01/05/2014 1616   CALCIUM 8.5 01/12/2014 0551   GFRNONAA 33* 01/12/2014 0551   GFRAA 38* 01/12/2014 0551   Lab Results  Component Value Date   ALT 66* 01/11/2014   AST 36 01/11/2014   ALKPHOS 236* 01/11/2014   BILITOT 0.4 01/11/2014   GGT:  10/27/2012: 170 01/11/2014: 202  FOBT:  Positive x 2  Heparin level: 0.53 PT: 17.5 INR: 1.48  CBC 6:00    Component Value Date/Time   WBC 17.6* 01/12/2014 0551   RBC 3.03* 01/12/2014 0551   HGB  7.1* 01/12/2014 0551   HCT 22.2* 01/12/2014 0551   PLT 385 01/12/2014 0551   MCV 73.3* 01/12/2014 0551   MCH 23.4* 01/12/2014 0551   MCHC 32.0 01/12/2014 0551   RDW 17.8* 01/12/2014 0551   LYMPHSABS 1.5 01/05/2014 1616   MONOABS 1.5* 01/05/2014 1616   EOSABS 0.1 01/05/2014 1616   BASOSABS 0.0 01/05/2014 1616   CBC 12:00    Component Value Date/Time   WBC 19.0* 01/12/2014 1350   RBC 3.42* 01/12/2014 1350   HGB 8.0* 01/12/2014 1350   HCT 25.0* 01/12/2014 1350   PLT 424* 01/12/2014 1350   MCV 73.1* 01/12/2014 1350   MCH 23.4* 01/12/2014 1350   MCHC 32.0 01/12/2014 1350   RDW 17.8* 01/12/2014 1350   LYMPHSABS 1.5 01/05/2014 1616   MONOABS 1.5* 01/05/2014 1616   EOSABS 0.1 01/05/2014 1616   BASOSABS 0.0 01/05/2014 1616     Iron panel:  Lab Results  Component Value Date   IRON 12* 01/12/2014   TIBC 181* 01/12/2014   FERRITIN 179 01/12/2014   Urinalysis:  Urinalysis    Component Value Date/Time   COLORURINE YELLOW 01/11/2014 1509   APPEARANCEUR CLOUDY* 01/11/2014 1509   LABSPEC 1.016 01/11/2014 1509   PHURINE 5.5 01/11/2014 1509   GLUCOSEU 250* 01/11/2014 1509   HGBUR LARGE* 01/11/2014 1509   BILIRUBINUR NEGATIVE 01/11/2014 1509   KETONESUR NEGATIVE 01/11/2014 1509   PROTEINUR >300* 01/11/2014 1509   UROBILINOGEN 0.2 01/11/2014 1509   NITRITE NEGATIVE 01/11/2014 1509   LEUKOCYTESUR NEGATIVE 01/11/2014 1509  Urine casts: granular cast Crystals: triple phosphate crystal Many bacteria in urine  Urine chemistry:  Urine Na: 60 Urine Nitrogen: 302 Urine creatinine: 52.19  FE urea: 39% FE Na: 1.7%  Urine Free Kappa light chain: 51.30 (nl 0.14-2.42) Urine Free Lambda light chain: 12.10 (0.02-0.67) Urine Free Kapp to Lambda ratio: 4.24 (nl)   Micro Results: Recent Results (from the past 240 hour(s))  CULTURE, BLOOD (ROUTINE X 2)     Status: None   Collection Time    01/07/14  8:15 AM      Result Value Ref Range Status   Specimen Description BLOOD LEFT ARM   Final   Special Requests BOTTLES  DRAWN AEROBIC AND ANAEROBIC 10CC   Final   Culture  Setup Time     Final   Value: 01/07/2014 14:02     Performed at [facility]   Culture     Final   Value: STAPHYLOCOCCUS SPECIES (COAGULASE NEGATIVE)     Note: THE SIGNIFICANCE OF ISOLATING THIS ORGANISM FROM A SINGLE SET OF BLOOD CULTURES WHEN MULTIPLE SETS ARE DRAWN IS UNCERTAIN. PLEASE NOTIFY THE MICROBIOLOGY DEPARTMENT WITHIN ONE WEEK IF  SPECIATION AND SENSITIVITIES ARE REQUIRED.     Note: Gram Stain Report Called to,Read Back By and Verified With: [facility] 01/08/14 @ 5:56PM BY RUSCOE A.     Performed at [facility]   Report Status 01/09/2014 FINAL   Final  CULTURE, BLOOD (ROUTINE X 2)     Status: None   Collection Time    01/07/14  8:30 AM      Result Value Ref Range Status   Specimen Description BLOOD LEFT ARM   Final   Special Requests BOTTLES DRAWN AEROBIC AND ANAEROBIC 10CC   Final   Culture  Setup Time     Final   Value: 01/07/2014 14:02     Performed at [facility]   Culture     Final   Value:        BLOOD CULTURE RECEIVED NO GROWTH TO DATE CULTURE WILL BE HELD FOR 5 DAYS BEFORE ISSUING A FINAL NEGATIVE REPORT     Performed at [facility]   Report Status PENDING   Incomplete  ANAEROBIC CULTURE     Status: None   Collection Time    01/11/14  8:31 AM      Result Value Ref Range Status   Specimen Description WOUND FOOT LEFT   Final   Special Requests     Final   Value: LEFT PLANTAR ULCER PT ON ZOSYN AND ZYVOX AND AUGMENTIN   Gram Stain     Final   Value: NO WBC SEEN     NO SQUAMOUS EPITHELIAL CELLS SEEN     NO ORGANISMS SEEN     Performed at [facility]   Culture     Final   Value: NO ANAEROBES ISOLATED; CULTURE IN PROGRESS FOR 5 DAYS     Performed at [facility]   Report Status PENDING   Incomplete  WOUND CULTURE     Status: None   Collection Time    01/11/14  8:31 AM      Result Value Ref Range Status   Specimen Description WOUND FOOT LEFT   Final    Special Requests LEFT PLANTAR ULCER PT ON ZYVOX,ZOSYN AND AUGMENTIN   Final   Gram Stain     Final   Value: NO WBC SEEN     NO SQUAMOUS EPITHELIAL CELLS SEEN     NO ORGANISMS SEEN     Performed at [facility]   Culture     Final   Value: Culture reincubated for better growth     Performed at [facility]   Report Status PENDING   Incomplete   Studies/Results:  HIDA scan:  FINDINGS:  Liver uptake of radiotracer is normal. There is prompt visualization of gallbladder and small bowel, indicating patency of the cystic and common bile ducts.  IMPRESSION:  Study within normal limits.  Medications: I have reviewed the patient's current medications. Scheduled Meds: . ampicillin-sulbactam (UNASYN) IVPB 3 g  3 g Intravenous Q8H  . carvedilol  6.25 mg Oral BID WC  . furosemide  40 mg Oral Daily  . hydrALAZINE  10 mg Oral 4 times per day  . insulin aspart  0-9 Units Subcutaneous TID WC  . levETIRAcetam  500 mg Intravenous Q12H  . pantoprazole  20 mg Oral QAC breakfast  . verapamil  120 mg Oral Daily  . warfarin  7.5 mg Oral ONCE-1800  . Warfarin - Pharmacist Dosing Inpatient   Does not apply q1800   Continuous Infusions: .  sodium chloride 20 mL/hr (01/10/14 0537)  . heparin 1,750 Units/hr (01/10/14 1655)   PRN Meds:.sodium chloride Assessment/Plan: Principal Problem:   Acute acalculous cholecystitis Active Problems:   Hypertension   Diabetes mellitus with renal manifestation   Hypoalbuminemia   Diabetic nephropathy with proteinuria   Cellulitis   Acute respiratory failure   New onset seizure   Acute renal failure   Elevated alkaline phosphatase level   Microcytic anemia   DVT of popliteal vein   Acute on chronic diastolic heart failure   Possible GI bleeding   LOS: 6 days   Joshua [name] is a 63yo man with history of T2DM, HTN, L foot ulcer, previously treated extrapulmonary TB who has returned to IMTS from ICU with sepsis possibly due to acalculous  cholecystitis, dCHF, AKI on CKD with nephrotic syndrome, iron-deficiency anemia and acute DVT w/o PE.    # Septic shock likely due to acalculous cholecystitis: Acalculous cholecystitis was found on RUQ ultrasound with gallbladder wall thickening of 1.1cm. He has reported a 6 month history of RUQ pain after eating, but did not report it to the doctor. Patient has had consistently elevated alkaline phosphate since November, and his GGT was elevated in 2013 as well as during this admission, indicating liver as source of elevation in alk phos. AST and ALT are now normal after a brief elevation during his ICU stay AST (158) and ALT (128). TBili and DBili remained normal. The ICU setting can lead to acute acalculous cholecystitis, but the 67-monthlong symptom with consistently elevated alk phos suggests an underlying gallbladder disorder, most likely cholestasis from DM. Peptic ulcers are possible given the history, but please see below for management. Other causes for sepsis that are ruled out are systemic bacteremia (only 1 out of 2 cultures with coagulase negative staph) and osteomyelitis (MRI does not show osteomyelitis).  Plan  - Surgery consult: patient is not a surgical candidate, and HIDA does not show hepatobiliary abnormalities. Thus, will manage patient with antibiotics at this time.  - IV Unasyn 3g IV q6H to cover gram negative organisms and anaerobes - though heart failure team has advised to switch to another abx given high Na load with Unasyn in the setting of fluid overload and raising creatinine.  - PPI 272mPO qD   # AKI on CKD with nephrotic syndrome in the setting of dCHF: Cr trending from 0.8 on admission to 2.05 today. GFR of 38. FE Urea of 39%, FENa of 1.7%, BUN/Cr ratio < 20, and proteinuria indicating an intra-renal cause of AKI, but the sudden increase in Cr from normal Cr during hospitalization indicates a pre-renal cause especially given patient's dCHF and furosemide use. Liver cause  of low albumin is ruled out given normal ultrasound scan and relatively normal LFTs for hepatic cause. Intra renal causes include diabetic nephropathy (urine albumin/cr 8434.2) with nephrotic syndrome. UPEP returned with elevated light chains, but the kappa/lambda ratio is normal at 4, ruling out any monoclonal gammopathy. However, current AKI is most likely caused by [facility] well as diuresis. - Heart failure team recs appreciated - Consider Nephrology consult to manage diabetic nephropathy - Hold off ACEI until AKI resolves   # dCHF: patient presents with bilateral pleural effusion on CT, peripheral edema, JVD, SOB, ARF (Cr 1.75 on 2/13, but now 1.54 on 2/16), EF of 55, and grade 2 diastolic dysfunction on ECHO. Patient is -1.33m65mluid positive from the day before and is +7.9L fluid positive since admission. SOB is improving with  furosemide, but Cr has increased to 2.05 from 1.77 today most likely due to furosemide use. Patient is able to make urine  - Appreciate heart failure team's recs to aggressively diurize with IV lasix 80 mg bid, consider RHC and avoiding Unasyn due to high Na load. - Verapamil to 187m daily  - Coreg 6.25 mg bid - may titrate the dose if the BP cont to remain elevated  - hold off ACEI with AKI - Follow up on Renal Labs  - Will aim for negative or even fluid balance. He is still positive since admission   # Iron-deficiency Anemia: hemoglobin of 8.2 in 2015, but 13.9 in 2010. Hgb is stable at 8-9. Iron panel shows low iron stores (Iron 19, Ferritin 179). FOBT is positive x2. Patient has been having RUQ pain with dark stools for 6 months or more. Likely causes include peptic ulcer bleed and lower GI bleed, diabetic nephropathy, and multiple myeloma. No NSAIDs were used at home. No colonoscopy on file, but there was 3 negative FOBT in 10/2012.  -GI recommends air-constrat barium enema to look for significant lesions in colon, and an upper GI series for peptic ulcers. These studies  will not interfere with patient's anticoagulation regimen.  -Protonix 259mPO qD: upper GI bleed and RUQ pain after eating  -Oral iron supplementation  # Hypoalbuminemia:  Patient has albumin of 1.7 with protein gap of 5.3 from total protein. Patient has a baseline hypoalbuminemia. However, UPEP showed elevated kappa and lambda free light chains, but the kappa/lambda ratio is normal (4). We are less concerned for paraprotein phenomenon even though patient has anemia, AKI, and elevated alkaline phosphate. Most likely cause is nephrotic syndrome. Patient does not appear to have liver disease given normal liver appearance on ultrasound and normal AST/ALT, and the acalculous cholecystitis can explain the elevated alk phos level.  -SPEP follow-up   # Acute DVT: Patient was found to have bilateral acute DVT without PE. Therapeutic on heparin with UFH of 0.5 and goal of 0.3-0.7. UFH at desired dose per anticoagulation consult recommendation. Warfarin add on will be managed by anticoagulation team. If patient undergoes surgery, anticoagulation team will be notified to manage anticoagulation  -Anticoagulation team recs appreciated  - on Hep gtt  - Continue coumadin per pharmacy  - will likely require anticoagulants for three months for first time DVT   # Diabetic foot ulcer: MRI of L foot did not show osteomyelitis, but did show local cellulitis and possible myositis. Ortho performed debridement, obtained wound culture, and packed wound to prevent abscess formation -F/u wound culture -Unasyn IV should cover typical species found in wounds   # Seizure: EEG showed medication effect. This is patient's first seizure in the setting of acute respiratory failure. This is most likely an isolated event. Patient does not have a history of substance abuse, and labs shows that electrolytes are in normal ranges.  -Taper Keppra over 2 weeks, propose tapering by 25039mer 3 days. -Neuro check   Proph:  DVT: already  on UFH    This is a [employer] information officerte.  The care of the patient was discussed with Dr. ComLinus Salmonsd the assessment and plan formulated with their assistance.  Please see their attached note for official documentation of the daily encounter.  JiaKonrad Saha18/2015, 11:55 AM      I have seen the patient and reviewed the daily progress note by RuiElvin So IV and discussed the care of the patient with them.  I  have agree with the note and with additions/correction made in the note.   Signed:  Jessee Avers, MD PGY-2 Internal Medicine Teaching Service Pager: [phone]

## 2014-01-12 NOTE — Progress Notes (Signed)
Denies abd pain today. Want to eat.  Wbc down today.   Alert, nad abd soft, nt, nd  HIDA neg for acute cholecystitis.   Not sure etiology of wbc. Perhaps component chronic cholecystitis since he has had c/o intermittent postprandial RUQ pain for several months. But currently no indication for active intervention on his GB during this admission given HIDA results.   rec start clears and adv as tolerated  Leighton Ruff. Redmond Pulling, MD, FACS General, Bariatric, & Minimally Invasive Surgery [facility], [place]

## 2014-01-12 NOTE — Progress Notes (Signed)
  Subjective: White count down today.  Positive FOBT, chronic anemia, hgb down.    Objective: Vital signs in last 24 hours: Temp:  [97.4 F (36.3 C)-98.2 F (36.8 C)] 97.4 F (36.3 C) (02/18 0423) Pulse Rate:  [63-86] 69 (02/18 0423) Resp:  [18-20] 18 (02/18 0423) BP: (127-181)/(61-96) 150/77 mmHg (02/18 0423) SpO2:  [100 %] 100 % (02/18 0423) Weight:  [150 lb 6.8 oz (68.233 kg)] 150 lb 6.8 oz (68.233 kg) (02/18 0423) Last BM Date: 01/09/14  Intake/Output from previous day: 02/17 0701 - 02/18 0700 In: -  Out: 1350 [Urine:1350] Intake/Output this shift:   PE GI: soft, non-tender; bowel sounds normal; no masses,  no organomegaly  Lab Results:   Recent Labs  01/11/14 0605 01/12/14 0551  WBC 23.1* 17.6*  HGB 8.0* 7.1*  HCT 25.3* 22.2*  PLT 420* 385   BMET  Recent Labs  01/11/14 0605 01/12/14 0551  NA 140 140  K 3.7 3.5*  CL 104 105  CO2 22 23  GLUCOSE 145* 123*  BUN 23 30*  CREATININE 1.77* 2.05*  CALCIUM 8.4 8.5   PT/INR  Recent Labs  01/11/14 0605 01/12/14 0551  LABPROT 15.5* 17.5*  INR 1.26 1.48   ABG No results found for this basename: PHART, PCO2, PO2, HCO3,  in the last 72 hours  Studies/Results: No results found.  Anti-infectives: Anti-infectives   Start     Dose/Rate Route Frequency Ordered Stop   01/11/14 1015  Ampicillin-Sulbactam (UNASYN) 3 g in sodium chloride 0.9 % 100 mL IVPB     3 g 100 mL/hr over 60 Minutes Intravenous Every 8 hours 01/11/14 1003     01/10/14 2200  amoxicillin-clavulanate (AUGMENTIN) 875-125 MG per tablet 1 tablet  Status:  Discontinued     1 tablet Oral Every 12 hours 01/10/14 1817 01/11/14 0904   01/08/14 1300  piperacillin-tazobactam (ZOSYN) IVPB 3.375 g  Status:  Discontinued     3.375 g 12.5 mL/hr over 240 Minutes Intravenous Every 8 hours 01/08/14 1206 01/10/14 1817   01/07/14 1730  linezolid (ZYVOX) IVPB 600 mg  Status:  Discontinued    Comments:  sepsis   600 mg 300 mL/hr over 60 Minutes Intravenous  Every 12 hours 01/07/14 1705 01/10/14 1817   01/07/14 1600  vancomycin (VANCOCIN) IVPB 1000 mg/200 mL premix  Status:  Discontinued     1,000 mg 200 mL/hr over 60 Minutes Intravenous STAT 01/07/14 1530 01/07/14 1800   01/07/14 1600  piperacillin-tazobactam (ZOSYN) IVPB 3.375 g     3.375 g 12.5 mL/hr over 240 Minutes Intravenous STAT 01/07/14 1530 01/07/14 2100      Assessment/Plan: Elevated lfts  ruq pain  Acalculous cholecystitis Await HIDA results.  If positive, we will proceed with a perc chole drain.   He is not a surgical candidate  Continue Unasyn Discussed with primary team, considering GI consult for anemia work up.  Surgery will follow.    LOS: 6 days    Timia Casselman ANP-BC 01/12/2014 9:02 AM

## 2014-01-12 NOTE — Progress Notes (Signed)
Subjective: Pt doing well this AM, pain reduced from yesterday, appetite good.  Pt c/o mild SOB. Pt denies N/V/F/C, chest pain, or paresthesias.  Objective: Vital signs in last 24 hours: Temp:  [97.4 F (36.3 C)-98.2 F (36.8 C)] 97.4 F (36.3 C) (02/18 0423) Pulse Rate:  [63-86] 69 (02/18 0423) Resp:  [18-20] 18 (02/18 0423) BP: (127-181)/(61-96) 150/77 mmHg (02/18 0423) SpO2:  [100 %] 100 % (02/18 0423) Weight:  [68.233 kg (150 lb 6.8 oz)] 68.233 kg (150 lb 6.8 oz) (02/18 0423)  Intake/Output from previous day: 02/17 0701 - 02/18 0700 In: -  Out: 1350 [Urine:1350] Intake/Output this shift:     Recent Labs  01/10/14 0440 01/11/14 0605 01/12/14 0551  HGB 8.2* 8.0* 7.1*    Recent Labs  01/11/14 0605 01/12/14 0551  WBC 23.1* 17.6*  RBC 3.41* 3.03*  HCT 25.3* 22.2*  PLT 420* 385    Recent Labs  01/11/14 0605 01/12/14 0551  NA 140 140  K 3.7 3.5*  CL 104 105  CO2 22 23  BUN 23 30*  CREATININE 1.77* 2.05*  GLUCOSE 145* 123*  CALCIUM 8.4 8.5    Recent Labs  01/11/14 0605 01/12/14 0551  INR 1.26 1.48    WD WN 62y/o male in NAD, A/Ox3, appears stated age.  EOMI, mood and affect normal, respirations mildly labored.  Dressing intact, taken down.  Iodoform gauze still in place with moderate amount of purulent drainage from plantar ulcer present.  Left mid-forefoot 2+ edema with reduced strength of ankle dorsiflexion and plantarflexion compared to contralateral side.  DP pulse 1+ to left, venous stasis noted on exam b/l ankles.  Assessment/Plan: Noted is a reduction in WBC count this AM.  Dressing changes to continue each AM and include prn if needed.  Continue antibiotics and medical management as dictated by Dr. Tommy Medal.   Emery Binz S 01/12/2014, 8:01 AM

## 2014-01-12 NOTE — Consult Note (Signed)
Subjective:   HPI  The patient is a 63 year old male with multiple medical problems including hypertension and type 2 diabetes. He has also had previous treatment for extra pulmonary TB. He was admitted to the hospital because of a worsening foot ulcer and he had increasing pain and chills. While in the hospital he had acute respiratory failure on the evening of February 13 and was transferred to ICU where he was intubated and he had a witnessed seizure. He has been having right-sided abdominal pain and was evaluated by surgery for possible acute acalculous cholecystitis, the hida scan was negative. It was not felt that he needed cholecystectomy per surgery.  We are asked to see him in regards to anemia. His hemoglobin and hematocrit at last check were 8 and 25. Iron studies reveal that he is iron deficient. He did have some stool studies that were negative for occult blood but then finally another one came out positive. He has been started on heparin and Coumadin for bilateral lower extremity DVT.  He has never had an ulcer. He denies rectal bleeding. He has never had his colon checked.  Review of Systems Denies chest pain or shortness of breath  Past Medical History  Diagnosis Date  . 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 Hypertension 10/14/2012  . Type 2 diabetes mellitus   . Diabetic nephropathy with proteinuria   . 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 Knee arthroscopy  2010    3 times, right knee   History   Social History  . Marital Status: Single    Spouse Name: N/A    Number of Children: 5  . 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 Smoking status: Former Smoker -- 20 years    Types: Cigarettes    Quit date: 11/25/1992  . Smokeless tobacco: Never Used  . Alcohol Use: No     Comment: Rarely.  . Drug Use: No  . Sexual Activity: Not  on file     Comment: 09/2012: > 15 partners in past 3 years, inconsistent condom use   Other Topics Concern  . Not on file   Social History Narrative   Has a masters degree in education   family history is not on file. Current facility-administered medications:0.9 %  sodium chloride infusion, , Intravenous, Continuous, Carin Hock, MD, Last Rate: 20 mL/hr at 01/10/14 0537, 20 mL/hr at 01/10/14 0537;  Ampicillin-Sulbactam (UNASYN) 3 g in sodium chloride 0.9 % 100 mL IVPB, 3 g, Intravenous, Q8H, Thayer Headings, MD, 3 g at 01/12/14 1006;  carvedilol (COREG) tablet 6.25 mg, 6.25 mg, Oral, BID WC, Jessee Avers, MD, 6.25 mg at 01/12/14 1307 furosemide (LASIX) tablet 40 mg, 40 mg, Oral, Daily, Jessee Avers, MD, 40 mg at 01/12/14 1307;  heparin ADULT infusion 100 units/mL (25000 units/250 mL), 1,750 Units/hr, Intravenous, Continuous, Brain Hilts, RPH, Last Rate: 17.5 mL/hr at 01/10/14 1655, 1,750 Units/hr at 01/10/14 1655;  hydrALAZINE (APRESOLINE) tablet 10 mg, 10 mg, Oral, 4 times per day, Jessee Avers, MD, 10 mg at 01/12/14 1307 insulin aspart (novoLOG) injection 0-9 Units, 0-9 Units, Subcutaneous, TID WC, Thayer Headings, MD, 2 Units at 01/11/14 1734;  levETIRAcetam (KEPPRA) 500 mg in sodium chloride 0.9 % 100 mL IVPB, 500 mg, Intravenous, Q12H, Wilhelmina Mcardle, MD, 500 mg at 01/12/14 0945;  pantoprazole (PROTONIX) EC tablet 20  mg, 20 mg, Oral, QAC breakfast, Jessee Avers, MD, 20 mg at 01/12/14 [id number] sodium chloride 0.9 % injection 10-40 mL, 10-40 mL, Intracatheter, PRN, Wilhelmina Mcardle, MD, 10 mL at 01/11/14 [id number];  verapamil (CALAN-SR) CR tablet 120 mg, 120 mg, Oral, Daily, Thayer Headings, MD, 120 mg at 01/12/14 1354;  warfarin (COUMADIN) tablet 7.5 mg, 7.5 mg, Oral, ONCE-1800, Dareen Piano, [facility];  Warfarin - Pharmacist Dosing Inpatient, , Does not apply, q1800, Jessee Avers, MD No Known Allergies   Objective:     BP 184/104  Pulse 94  Temp(Src) 97.6 F (36.4 C)  (Oral)  Resp 20  Ht 5\' 6"  (1.676 m)  Wt 68.233 kg (150 lb 6.8 oz)  BMI 24.29 kg/m2  SpO2 90%  He is in no distress  Heart regular rhythm no murmurs  Lungs clear  Abdomen: Bowel sounds normal, soft, nontender  Laboratory No components found with this basename: d1      Assessment:     Iron deficiency anemia  1 heme positive stool      Plan:     Given this patient's underlying medical problems and recent hospital course I would prefer evaluation of the GI tract with barium studies rather than endoscopic evaluation which would be more risky and requires sedation, and he would need to be taken off of his Coumadin and heparin. I would recommend doing an air-contrast barium enema to look for any significant lesions in the colon, and then an upper GI series. He would not have to be off of anticoagulation to get the barium studies. This could even be done as an outpatient. Lab Results  Component Value Date   HGB 8.0* 01/12/2014   HGB 7.1* 01/12/2014   HGB 8.0* 01/11/2014   HCT 25.0* 01/12/2014   HCT 22.2* 01/12/2014   HCT 25.3* 01/11/2014   ALKPHOS 236* 01/11/2014   ALKPHOS 154* 01/09/2014   ALKPHOS 223* 01/08/2014   AST 36 01/11/2014   AST 66* 01/09/2014   AST 158* 01/08/2014   ALT 66* 01/11/2014   ALT 94* 01/09/2014   ALT 128* 01/08/2014   AMYLASE 118* 01/08/2014

## 2014-01-12 NOTE — Consult Note (Signed)
Advanced Heart Failure Team Consult Note  Referring Physician: Teaching Service Primary Physician: Dr. Redmond Pulling (Internal Medicine) Primary Cardiologist:  N/A  Reason for Consultation: Diastolic HF  HPI:   Asked to provide consult from Teaching Service  Joshua [name] is a 63 yo male with a history of HTN, DM2, previous treatment for extrapulmonary TB, diabetic neuropathy, HTN, diastolic HF, L DVT bilateral peroneal veins (12/2013) and chronic L plantar foot ulcer for over a year.  Pt was admitted for worsening foot ulcer with increasing pain and chills. He had AMS with hypoxia and hypotension and was transferred to ICU where he was intubated. While in ICU he had a seizure, EEG which showed no epileptiform activity. CT chest showed bilateral effusions. Developed RUQ pain and US showed concern for acute acalculous cholecystitis. HIDA negative and started on abx.  Denies SOB, CP or orthopnea. Reports weight at home usually 145 lbs and that he has noticed in past 3 months increased SOB. Able to perform all ADLs. SBP at home 150-170s.   Review of Systems: [y] = yes, '[ ]'  = no   General: Weight gain [Y ]; Weight loss '[ ]' ; Anorexia '[ ]' ; Fatigue '[ ]' ; Fever '[ ]' ; Chills '[ ]' ; Weakness '[ ]'   Cardiac: Chest pain/pressure '[ ]' ; Resting SOB [Y ]; Exertional SOB [ Y]; Orthopnea '[ ]' ; Pedal Edema [Y ]; Palpitations '[ ]' ; Syncope '[ ]' ; Presyncope '[ ]' ; Paroxysmal nocturnal dyspnea'[ ]'   Pulmonary: Cough '[ ]' ; Wheezing'[ ]' ; Hemoptysis'[ ]' ; Sputum '[ ]' ; Snoring '[ ]'   GI: Vomiting'[ ]' ; Dysphagia'[ ]' ; Melena'[ ]' ; Hematochezia '[ ]' ; Heartburn'[ ]' ; Abdominal pain '[ ]' ; Constipation '[ ]' ; Diarrhea '[ ]' ; BRBPR '[ ]'   GU: Hematuria'[ ]' ; Dysuria '[ ]' ; Nocturia'[ ]'   Vascular: Pain in legs with walking '[ ]' ; Pain in feet with lying flat '[ ]' ; Non-healing sores [Y ]; Stroke '[ ]' ; TIA '[ ]' ; Slurred speech '[ ]' ;  Neuro: Headaches'[ ]' ; Vertigo'[ ]' ; Seizures'[ ]' ; Paresthesias'[ ]' ;Blurred vision '[ ]' ; Diplopia '[ ]' ; Vision changes '[ ]'   Ortho/Skin: Arthritis '[ ]' ; Joint  pain [ Y]; Muscle pain '[ ]' ; Joint swelling '[ ]' ; Back Pain '[ ]' ; Rash '[ ]'   Psych: Depression'[ ]' ; Anxiety'[ ]'   Heme: Bleeding problems '[ ]' ; Clotting disorders '[ ]' ; Anemia '[ ]'   Endocrine: Diabetes '[ ]' ; Thyroid dysfunction'[ ]'   Home Medications Prior to Admission medications   Medication Sig Start Date End Date Taking? Authorizing Provider  lisinopril (PRINIVIL,ZESTRIL) 40 MG tablet Take 1 tablet (40 mg total) by mouth daily. 11/26/13 11/26/14 Yes Duwaine Maxin, DO  metFORMIN (GLUCOPHAGE) 500 MG tablet Take 2 tablets (1,000 mg total) by mouth 2 (two) times daily with a meal. 11/26/13 11/26/14 Yes Alex Redmond Pulling, DO  verapamil (CALAN-SR) 180 MG CR tablet Take 1 tablet (180 mg total) by mouth daily. 01/05/14 01/05/15 Yes Duwaine Maxin, DO  glucose monitoring kit (FREESTYLE) monitoring kit 1 each by Does not apply route as needed for other. 12/04/12   Neta Ehlers, MD    Past Medical History: Past Medical History  Diagnosis Date  . 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 Hypertension 10/14/2012  . Type 2 diabetes mellitus   . Diabetic nephropathy with proteinuria   . 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 Knee arthroscopy  2010    3 times, right knee    Family History: No family history on file.  Social History: History  Social History  . Marital Status: Single    Spouse Name: N/A    Number of Children: 5  . 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 Smoking status: Former Smoker -- 20 years    Types: Cigarettes    Quit date: 11/25/1992  . Smokeless tobacco: Never Used  . Alcohol Use: No     Comment: Rarely.  . Drug Use: No  . Sexual Activity: None     Comment: 09/2012: > 15 partners in past 3 years, inconsistent condom use   Other Topics Concern  . None   Social History Narrative   Has a masters degree in education     Allergies:  No Known Allergies  Objective:    Vital Signs:   Temp:  [97.4 F (36.3 C)-98.2 F (36.8 C)] 97.6 F (36.4 C) (02/18 1345) Pulse Rate:  [63-94] 94 (02/18 1345) Resp:  [18-20] 20 (02/18 1345) BP: (127-184)/(61-104) 184/104 mmHg (02/18 1345) SpO2:  [90 %-100 %] 90 % (02/18 1345) Weight:  [150 lb 6.8 oz (68.233 kg)] 150 lb 6.8 oz (68.233 kg) (02/18 0423) Last BM Date: 01/09/14  Weight change: Filed Weights   01/09/14 1243 01/11/14 0500 01/12/14 0423  Weight: 151 lb 1.6 oz (68.539 kg) 151 lb 0.2 oz (68.5 kg) 150 lb 6.8 oz (68.233 kg)    Intake/Output:   Intake/Output Summary (Last 24 hours) at 01/12/14 1617 Last data filed at 01/12/14 0423  Gross per 24 hour  Intake      0 ml  Output    650 ml  Net   -650 ml     Physical Exam: General:  Frail appearing. Sitting in chair. No resp difficulty HEENT: normal Neck: supple. JVP to ear . Carotids 2+ bilat; no bruits. No lymphadenopathy or thryomegaly appreciated. Cor: PMI nondisplaced. Distant. Regular rate & rhythm. No obvious rubs, gallops or murmurs. Lungs: clear Abdomen: soft, nontender, + distended. No hepatosplenomegaly. No bruits or masses. Good bowel sounds. Extremities: no cyanosis, clubbing, rash, 3+ woody bilateral edema INTO THIGH; L foot dressing intact Neuro: alert & orientedx3, cranial nerves grossly intact. moves all 4 extremities w/o difficulty. Affect pleasant  Telemetry: Sinus rhythm 70s  Labs: Basic Metabolic Panel:  Recent Labs Lab 01/08/14 0400 01/09/14 0428 01/10/14 0440 01/11/14 0605 01/12/14 0551  NA 138 141 139 140 140  K 3.8 3.6* 3.7 3.7 3.5*  CL 104 107 105 104 105  CO2 '20 23 22 22 23  ' GLUCOSE 126* 80 154* 145* 123*  BUN 37* 26* 22 23 30*  CREATININE 1.68* 1.52* 1.53* 1.77* 2.05*  CALCIUM 7.7* 7.7* 8.1* 8.4 8.5    Liver Function Tests:  Recent Labs Lab 01/07/14 1511 01/08/14 0400 01/09/14 0428 01/11/14 1115  AST 43* 158* 66* 36  ALT 32 128* 94* 66*  ALKPHOS  177* 223* 154* 236*  BILITOT 0.4 0.4 0.2* 0.4  PROT 5.9* 7.0 5.9* 7.3  ALBUMIN 1.3* 1.7* 1.3* 1.6*    Recent Labs Lab 01/08/14 0400  LIPASE 18  AMYLASE 118*   No results found for this basename: AMMONIA,  in the last 168 hours  CBC:  Recent Labs Lab 01/09/14 0428 01/10/14 0440 01/11/14 0605 01/12/14 0551 01/12/14 1350  WBC 13.9* 20.7* 23.1* 17.6* 19.0*  HGB 7.9* 8.2* 8.0* 7.1* 8.0*  HCT 24.5* 25.7* 25.3* 22.2* 25.0*  MCV 73.8* 73.9* 74.2* 73.3* 73.1*  PLT 415* 446* 420* 385 424*    Cardiac  Enzymes:  Recent Labs Lab 01/07/14 1515 01/07/14 1830 01/08/14 0102 01/08/14 0620  TROPONINI <0.30 <0.30 <0.30 <0.30    BNP: BNP (last 3 results) No results found for this basename: PROBNP,  in the last 8760 hours  CBG:  Recent Labs Lab 01/11/14 0758 01/11/14 1210 01/11/14 1733 01/11/14 2021 01/12/14 0747  GLUCAP 131* 156* 197* 184* 123*    Coagulation Studies:  Recent Labs  01/10/14 1920 01/11/14 0605 01/12/14 0551  LABPROT 13.8 15.5* 17.5*  INR 1.08 1.26 1.48    Imaging: Nm Hepatobiliary  01/12/2014   CLINICAL DATA:  Upper abdominal pain  EXAM: NUCLEAR MEDICINE HEPATOBILIARY IMAGING  Views:  Anterior right upper quadrant  Radionuclide:  Technetium 24mCholetec  Dose:  5.0 mCi  Route of administration: Intravenous  COMPARISON:  None.  FINDINGS: Liver uptake of radiotracer is normal. There is prompt visualization of gallbladder and small bowel, indicating patency of the cystic and common bile ducts.  IMPRESSION: Study within normal limits.   Electronically Signed   By: WLowella GripM.D.   On: 01/12/2014 13:14      Medications:     Current Medications: . ampicillin-sulbactam (UNASYN) IVPB 3 g  3 g Intravenous Q8H  . carvedilol  6.25 mg Oral BID WC  . furosemide  40 mg Oral Daily  . hydrALAZINE  10 mg Oral 4 times per day  . insulin aspart  0-9 Units Subcutaneous TID WC  . levETIRAcetam  500 mg Intravenous Q12H  . pantoprazole  20 mg Oral QAC  breakfast  . verapamil  120 mg Oral Daily  . warfarin  7.5 mg Oral ONCE-1800  . Warfarin - Pharmacist Dosing Inpatient   Does not apply q1800     Infusions: . sodium chloride 20 mL/hr (01/10/14 0537)  . heparin 1,750 Units/hr (01/10/14 1655)      Assessment:   1) Acute on chronic diastolic HF with massive volume overload 2) Acute renal failure   - baseline Cr 0.8 3) Diabetic foot ulcer, L 4) Acute DVT 5) HTN 6) + SPEP 7) Seizure 8) Acalculous cholecystitis/sepsis   Plan/Discussion:    Mr SNaramoreis a pleasant 63yo male with HTN, AKI and Acute diastolic HF. He is markedly volume overloaded and JVD is elevated. Will increase lasix to 80 mg IV BID and follow Cr closely. Suspect that the AKI is from septic shock when he developed hypotension and was intubated. He also maybe having some SOB from chronic anemia.Consider IV iron.   SBP elevated, will add hydralazine 25 mg TID and IMDUR 30 mg daily for afterload reduction. If Cr improves would start ACE-I and try to wean hydralazine and nitrates off d/t cost and compliance.   Concerned that patient may have amyloid.  +UPEP   Length of Stay: 6[place]2/18/2015, 4:17 PM  Advanced Heart Failure Team Pager [phone](M-F; 7a - 4p)  Please contact [facility] for night-coverage after hours (4p -7a ) and weekends on [email or web address]  Patient seen and examined with AJunie Bame NP. We discussed all aspects of the encounter. I agree with the assessment and plan as stated above.   Difficult case. He has massive volume overload with worsening renal function. LVEF is on low end of normal. I suspect he may have some component of ATN from recent sepsis bur renal function actually got a bit better intially and now is getting worse. UPEP show elevated light chains and raises the possibility of myeloma/amyloid although this is nonspecific. May need  to consider fat pad biopsy or cardiac MRI to further evaluate as he improves.   At  this point I would be more aggressive with attempts at diuresis and start lasix 80 IV bid and give him a dose of metolazone. I would also continue switching antibiotics as Unasyn has a very heavy salt load. (Zosyn ok)  If renal function continues to worsen can consider RHC to further evaluate. Iron stores are also quite low and could consider IV iron if primary team feels it is appropriate.   Joshua [name] 5:33 PM

## 2014-01-13 LAB — CULTURE, BLOOD (ROUTINE X 2): CULTURE: NO GROWTH

## 2014-01-13 LAB — UIFE/LIGHT CHAINS/TP QN, 24-HR UR
ALPHA 2 UR: DETECTED — AB
Albumin, U: DETECTED
Alpha 1, Urine: DETECTED — AB
BETA UR: DETECTED — AB
FREE KAPPA LT CHAINS, UR: 51.3 mg/dL — AB (ref 0.14–2.42)
Free Kappa/Lambda Ratio: 4.24 ratio (ref 2.04–10.37)
Free Lambda Lt Chains,Ur: 12.1 mg/dL — ABNORMAL HIGH (ref 0.02–0.67)
Gamma Globulin, Urine: DETECTED — AB
Total Protein, Urine: 216.7 mg/dL

## 2014-01-13 LAB — BASIC METABOLIC PANEL
BUN: 30 mg/dL — ABNORMAL HIGH (ref 6–23)
CALCIUM: 8.2 mg/dL — AB (ref 8.4–10.5)
CO2: 24 meq/L (ref 19–32)
Chloride: 104 mEq/L (ref 96–112)
Creatinine, Ser: 1.99 mg/dL — ABNORMAL HIGH (ref 0.50–1.35)
GFR calc Af Amer: 40 mL/min — ABNORMAL LOW (ref >90)
GFR calc non Af Amer: 34 mL/min — ABNORMAL LOW (ref >90)
GLUCOSE: 131 mg/dL — AB (ref 70–99)
POTASSIUM: 3.6 meq/L — AB (ref 3.7–5.3)
SODIUM: 141 meq/L (ref 137–147)

## 2014-01-13 LAB — GLUCOSE, CAPILLARY
GLUCOSE-CAPILLARY: 124 mg/dL — AB (ref 70–99)
GLUCOSE-CAPILLARY: 183 mg/dL — AB (ref 70–99)
Glucose-Capillary: 129 mg/dL — ABNORMAL HIGH (ref 70–99)
Glucose-Capillary: 153 mg/dL — ABNORMAL HIGH (ref 70–99)

## 2014-01-13 LAB — PROTIME-INR
INR: 2.08 — AB (ref 0.00–1.49)
Prothrombin Time: 22.7 seconds — ABNORMAL HIGH (ref 11.6–15.2)

## 2014-01-13 LAB — HEPARIN LEVEL (UNFRACTIONATED): Heparin Unfractionated: 0.44 IU/mL (ref 0.30–0.70)

## 2014-01-13 MED ORDER — LEVETIRACETAM 250 MG PO TABS
250.0000 mg | ORAL_TABLET | Freq: Two times a day (BID) | ORAL | Status: AC
  Administered 2014-01-13 – 2014-01-14 (×3): 250 mg via ORAL
  Filled 2014-01-13 (×3): qty 1

## 2014-01-13 MED ORDER — HYDRALAZINE HCL 50 MG PO TABS
50.0000 mg | ORAL_TABLET | Freq: Three times a day (TID) | ORAL | Status: DC
  Administered 2014-01-13 – 2014-01-21 (×23): 50 mg via ORAL
  Filled 2014-01-13 (×26): qty 1

## 2014-01-13 MED ORDER — PIPERACILLIN-TAZOBACTAM 3.375 G IVPB
3.3750 g | Freq: Three times a day (TID) | INTRAVENOUS | Status: DC
  Administered 2014-01-13 – 2014-01-14 (×3): 3.375 g via INTRAVENOUS
  Filled 2014-01-13 (×6): qty 50

## 2014-01-13 MED ORDER — WARFARIN SODIUM 2.5 MG PO TABS
2.5000 mg | ORAL_TABLET | Freq: Once | ORAL | Status: AC
  Administered 2014-01-13: 2.5 mg via ORAL
  Filled 2014-01-13: qty 1

## 2014-01-13 MED ORDER — METOLAZONE 2.5 MG PO TABS
2.5000 mg | ORAL_TABLET | Freq: Once | ORAL | Status: AC
  Administered 2014-01-13: 2.5 mg via ORAL
  Filled 2014-01-13: qty 1

## 2014-01-13 NOTE — Evaluation (Addendum)
Physical Therapy Evaluation Patient Details Name: Joshua [name] MRN: [id number] DOB: [birth year] Today's Date: 01/13/2014 Time: [id number] PT Time Calculation (min): 36 min  PT Assessment / Plan / Recommendation History of Present Illness  63 y.o. male admitted to [facility] on 01/06/14 with DM2, previous treatment (abbreviated) for extrapulmonary TB admitted with worseing foot ulcer with increasing pain, chills.  He had a foot ulcer plantar surface first metatarsal head left foot.  His hospital course was complicated by bradycardia, hypotension, seizure in the ICU.  The pt was intubated 01/07/14-01/08/14.  He was found to have bil peroneal DVTs, bil pleural effusions on chest CT (neg for PE), abdominal US revealed acute acalculous cholecystitis, and MRI of the foot showed no osteo, diffuse cellulitis, myositis, small 1st MTP joint effusion that is likely aseptic and incidental, osteonecrosis of first MTP joint.  Ortho preformed a bedside aspiration of the wound on 01/11/14.  Ortho recommends WBAT in post-op shoe.    Clinical Impression  Pt is extremely deconditioned and unsteady on his feet.  Even with a RW it was mod to max assist to mobilize him and walk to the doorway and back.  On a good note, he did well on RA maintaining sats 90-96% on RA during gait despite DOE 2/4.  Pt's cousin is willing to take him in and his wife could be home to assist with his mobility, but at this level of care he would be unsafe to go home yet and would benefit from rehab before going home with family.  Pt crying at end of session because he has never been this immobile in his life and he doesn't want to have to depend on his cousin's assist.   PT to follow acutely for deficits listed below.       PT Assessment  Patient needs continued PT services    Follow Up Recommendations  CIR    Does the patient have the potential to tolerate intense rehabilitation     Yes  Barriers to Discharge Decreased caregiver support Pt's  cousin has agreed to take him in and his wife would assist him at home, but at current level of mobility he would be unable to go home safely even with assist at this point.     Equipment Recommendations  Rolling walker with 5" wheels    Recommendations for Other Services OT consult   Frequency Min 3X/week    Precautions / Restrictions Precautions Precautions: Fall Precaution Comments: pt is very deconditioned and weak Restrictions Weight Bearing Restrictions: Yes LLE Weight Bearing: Weight bearing as tolerated Other Position/Activity Restrictions: Called [name] ortho PA who confirmed WBAT status and would like pt to walk in post-op shoe.     Pertinent Vitals/Pain O2 sats 90-96% on RA during mobility with DOE 2/4.  HR stable .  O2 turned down to 2 L O2 [place] (was on 3 at beginning of session) and RN made aware.       Mobility  Transfers Overall transfer level: Needs assistance Transfers: Sit to/from Stand Sit to Stand: Max assist General transfer comment: max assist to support trunk to get to standing over weak legs from the recliner chair and this is with the pt using his arms to push up to standing.  Ambulation/Gait Ambulation/Gait assistance: Mod assist Ambulation Distance (Feet): 15 Feet Assistive device: Rolling walker (2 wheeled) Gait Pattern/deviations: Step-through pattern;Shuffle;Trunk flexed Gait velocity: decreased General Gait Details: Pt with slow, shuffly gait pattern with decreased foot clearance bil and weak and shaking  legs.          PT Diagnosis: Difficulty walking;Generalized weakness;Abnormality of gait  PT Problem List: Decreased strength;Decreased activity tolerance;Decreased balance;Decreased mobility;Decreased knowledge of use of DME;Impaired sensation;Decreased skin integrity PT Treatment Interventions: DME instruction;Gait training;Stair training;Functional mobility training;Therapeutic activities;Therapeutic exercise;Balance training;Neuromuscular  re-education;Patient/family education     PT Goals(Current goals can be found in the care plan section) Acute Rehab PT Goals Patient Stated Goal: to go home and be independent again PT Goal Formulation: With patient/family Time For Goal Achievement: 01/27/14 Potential to Achieve Goals: Good  Visit Information  Last PT Received On: 01/13/14 Assistance Needed: +1 History of Present Illness: 63 y.o. male admitted to [facility] on 01/06/14 with DM2, previous treatment (abbreviated) for extrapulmonary TB admitted with worseing foot ulcer with increasing pain, chills.  He had a foot ulcer plantar surface first metatarsal head left foot.  His hospital course was complicated by bradycardia, hypotension, seizure in the ICU.  The pt was intubated 01/07/14-01/08/14.  He was found to have bil peroneal DVTs, bil pleural effusions on chest CT (neg for PE), abdominal US revealed acute acalculous cholecystitis, and MRI of the foot showed no osteo, diffuse cellulitis, myositis, small 1st MTP joint effusion that is likely aseptic and incidental, osteonecrosis of first MTP joint.  Ortho preformed a bedside aspiration of the wound on 01/11/14.  Ortho recommends WBAT in post-op shoe.         Prior [place] expects to be discharged to:: Private residence Living Arrangements: Alone Available Help at Discharge: Family;Other (Comment) (cousin and his wife) Type of Home: House Home Access: Level entry Home Layout: Two level Alternate Level Stairs-Number of Steps: 13 Alternate Level Stairs-Rails: None Home Equipment: Cane - single point Prior Function Level of Independence: Independent with assistive device(s) Comments: occationally would use a cane Communication Communication: No difficulties Dominant Hand: Right    Cognition  Cognition Arousal/Alertness: Awake/alert Behavior During Therapy: WFL for tasks assessed/performed Overall Cognitive Status: Within Functional Limits for tasks  assessed    Extremity/Trunk Assessment Upper Extremity Assessment Upper Extremity Assessment: Generalized weakness Lower Extremity Assessment Lower Extremity Assessment: RLE deficits/detail;LLE deficits/detail RLE Deficits / Details: strength 3-/5 per functional assessment.  Bil legs with signficant edema making them heavy and stiff.   RLE Sensation: decreased light touch LLE Deficits / Details: strength 3-/5 per functional assessment.  Bil legs with signficant edema making them heavy and stiff.   LLE Sensation: decreased light touch Cervical / Trunk Assessment Cervical / Trunk Assessment: Normal   Balance Balance Overall balance assessment: Needs assistance Sitting-balance support: Bilateral upper extremity supported;Feet supported Sitting balance-Leahy Scale: Fair Standing balance support: Bilateral upper extremity supported Standing balance-Leahy Scale: Poor General Comments General comments (skin integrity, edema, etc.): left ankle wrapped in gauze.  LE skin dry and bil legs edemous (L>R).  Took O2 off for mobility since pt reported that he did not use O2 at home PTA.  O2 ranged from 90-96% on RA during mobility.  O2 turned down to 2 L and returned to nose at end of session.  RN made aware.   End of Session PT - End of Session Equipment Utilized During Treatment: Gait belt Activity Tolerance: Patient limited by fatigue Patient left: in chair;with call bell/phone within reach;with family/visitor present Nurse Communication: Mobility status   Wells Guiles B. [place], [place], DPT [phone]   01/13/2014, 4:52 PM

## 2014-01-13 NOTE — Progress Notes (Signed)
Advanced Heart Failure Rounding Note  Referring Physician: Teaching Service  Primary Physician: Dr. Redmond Pulling (Internal Medicine)  Primary Cardiologist: N/A  Reason for Consultation: Diastolic HF  Subjective:    Joshua [name] is a 63 yo male with a history of HTN, DM2, previous treatment for extrapulmonary TB, diabetic neuropathy, HTN, diastolic HF, L DVT bilateral peroneal veins (12/2013) and chronic L plantar foot ulcer for over a year.   Pt was admitted for worsening foot ulcer with increasing pain and chills. He had AMS with hypoxia and hypotension and was transferred to ICU where he was intubated. While in ICU he had a seizure, EEG which showed no epileptiform activity. CT chest showed bilateral effusions. Developed RUQ pain and US showed concern for acute acalculous cholecystitis. HIDA negative and started on abx.  +UPEP. Started on 80 mg IV lasix BID yesterday with a dose of metolazone. Weight not accurate, 24 hr I/O -1.5 liters. Denies SOB, orthopnea, or CP. Cr stable 1.99.    Objective:   Weight Range:  Vital Signs:   Temp:  [97.6 F (36.4 C)-97.8 F (36.6 C)] 97.8 F (36.6 C) (02/19 0644) Pulse Rate:  [65-94] 75 (02/19 0644) Resp:  [18-20] 18 (02/19 0644) BP: (123-184)/(63-104) 155/80 mmHg (02/19 0644) SpO2:  [90 %-100 %] 100 % (02/19 0644) Weight:  [136 lb 14.8 oz (62.109 kg)] 136 lb 14.8 oz (62.109 kg) (02/19 0644) Last BM Date: 01/09/14  Weight change: Filed Weights   01/11/14 0500 01/12/14 0423 01/13/14 0644  Weight: 151 lb 0.2 oz (68.5 kg) 150 lb 6.8 oz (68.233 kg) 136 lb 14.8 oz (62.109 kg)    Intake/Output:   Intake/Output Summary (Last 24 hours) at 01/13/14 1009 Last data filed at 01/13/14 0645  Gross per 24 hour  Intake      0 ml  Output   1500 ml  Net  -1500 ml     Physical Exam: General: Frail appearing. Sitting in chair. No resp difficulty  HEENT: normal  Neck: supple. JVP to ear . Carotids 2+ bilat; no bruits. No lymphadenopathy or thryomegaly  appreciated.  Cor: PMI nondisplaced. Distant. Regular rate & rhythm. No obvious rubs, gallops or murmurs.  Lungs: clear  Abdomen: soft, nontender, + distended. No hepatosplenomegaly. No bruits or masses. Good bowel sounds.  Extremities: no cyanosis, clubbing, rash, 3+ woody bilateral edema INTO THIGH; L foot dressing intact  Neuro: alert & orientedx3, cranial nerves grossly intact. moves all 4 extremities w/o difficulty. Affect pleasant   Telemetry: SR 70-80s  Labs: Basic Metabolic Panel:  Recent Labs Lab 01/09/14 0428 01/10/14 0440 01/11/14 0605 01/12/14 0551 01/13/14 0413  NA 141 139 140 140 141  K 3.6* 3.7 3.7 3.5* 3.6*  CL 107 105 104 105 104  CO2 23 22 22 23 24   GLUCOSE 80 154* 145* 123* 131*  BUN 26* 22 23 30* 30*  CREATININE 1.52* 1.53* 1.77* 2.05* 1.99*  CALCIUM 7.7* 8.1* 8.4 8.5 8.2*    Liver Function Tests:  Recent Labs Lab 01/07/14 1511 01/08/14 0400 01/09/14 0428 01/11/14 1115  AST 43* 158* 66* 36  ALT 32 128* 94* 66*  ALKPHOS 177* 223* 154* 236*  BILITOT 0.4 0.4 0.2* 0.4  PROT 5.9* 7.0 5.9* 7.3  ALBUMIN 1.3* 1.7* 1.3* 1.6*    Recent Labs Lab 01/08/14 0400  LIPASE 18  AMYLASE 118*   No results found for this basename: AMMONIA,  in the last 168 hours  CBC:  Recent Labs Lab 01/09/14 0428 01/10/14 0440 01/11/14 [id number] 01/12/14 0551  01/12/14 1350  WBC 13.9* 20.7* 23.1* 17.6* 19.0*  HGB 7.9* 8.2* 8.0* 7.1* 8.0*  HCT 24.5* 25.7* 25.3* 22.2* 25.0*  MCV 73.8* 73.9* 74.2* 73.3* 73.1*  PLT 415* 446* 420* 385 424*    Cardiac Enzymes:  Recent Labs Lab 01/07/14 1515 01/07/14 1830 01/08/14 0102 01/08/14 0620  TROPONINI <0.30 <0.30 <0.30 <0.30    BNP: BNP (last 3 results) No results found for this basename: PROBNP,  in the last 8760 hours  Imaging: Nm Hepatobiliary  01/12/2014   CLINICAL DATA:  Upper abdominal pain  EXAM: NUCLEAR MEDICINE HEPATOBILIARY IMAGING  Views:  Anterior right upper quadrant  Radionuclide:  Technetium 75m  Choletec  Dose:  5.0 mCi  Route of administration: Intravenous  COMPARISON:  None.  FINDINGS: Liver uptake of radiotracer is normal. There is prompt visualization of gallbladder and small bowel, indicating patency of the cystic and common bile ducts.  IMPRESSION: Study within normal limits.   Electronically Signed   By: Lowella Grip M.D.   On: 01/12/2014 13:14      Medications:     Scheduled Medications: . ampicillin-sulbactam (UNASYN) IVPB 3 g  3 g Intravenous Q8H  . carvedilol  6.25 mg Oral BID WC  . ferrous sulfate  325 mg Oral TID WC  . furosemide  80 mg Intravenous BID  . hydrALAZINE  25 mg Oral 3 times per day  . insulin aspart  0-9 Units Subcutaneous TID WC  . isosorbide mononitrate  30 mg Oral Daily  . levETIRAcetam  500 mg Intravenous Q12H  . pantoprazole  20 mg Oral QAC breakfast  . potassium chloride  20 mEq Oral BID  . verapamil  120 mg Oral Daily  . Warfarin - Pharmacist Dosing Inpatient   Does not apply q1800     Infusions: . sodium chloride 20 mL/hr (01/10/14 0537)  . heparin 1,750 Units/hr (01/12/14 1940)     PRN Medications:  sodium chloride   Assessment:   1) Acute on chronic diastolic HF with massive volume overload  2) Acute renal failure  - baseline Cr 0.8  3) Diabetic foot ulcer, L  4) Acute DVT  5) HTN  6) + SPEP  7) Seizure  8) Acalculous cholecystitis/sepsis   Plan/Discussion:    Joshua [name] is a pleasant 63 yo male with HTN, AKI and Acute diastolic HF. Started on IV lasix 80 mg BID yesterday with a dose of metolazone. Weight not accurate and patient reports they have not been weighing him. Will ask for daily weights on scale. Still volume overloaded will continue lasix to 80 mg IV BID with another dose of metolazone, follow Cr closely.  With concerns of amyloid, UPEP +light chains will order SPEP. Will hold off on cMRI currently with Cr 1.99.   SBP remains elevated will increase hydralazine to 50 mg TID. Continue to follow.    Suggest ordering ABIs with chronic L foot ulcer.   Length of Stay: [place] B NP-C 01/13/2014, 10:09 AM  Advanced Heart Failure Team Pager [phone] (M-F; 7a - 4p)  Please contact [facility] for night-coverage after hours (4p -7a ) and weekends on [email or web address]  Patient seen with NP, agree with the above note.   Acute on chronic diastolic CHF: agree with continuing Lasix 80 mg IV bid with one dose metolazone.  Only got 1 dose of 80 mg IV Lasix yesterday.  Creatinine so far stable, will need to follow closely.   Awaiting SPEP/UPEP results (could LVH/CHF be related to AL  amyloidosis?).  However, LVH and diastolic CHF may simply be due to poorly controlled HTN,  Suggest peripheral arterial dopplers to assess arterial circulation given pedal ulcer.   Loralie Champagne 01/13/2014 12:28 PM

## 2014-01-13 NOTE — Progress Notes (Signed)
  Date: 01/13/2014  Patient name: Joshua [name]  Medical record number: [id number]  Date of birth: 1951-11-12   This patient has been seen and the plan of care was discussed with the house staff. Please see their note for complete details. I concur with their findings with the following additions/corrections:  Will stop Unasyn and change to zosyn IV or to Augmentin po.    Thayer Headings, MD 01/13/2014, 9:14 AM

## 2014-01-13 NOTE — Progress Notes (Addendum)
Subjective: Pt doing well this afternoon.  Pain well controlled with no SOB at this time.  Pt also denies N/V/F/C, chest pain or changes in appetite.  Objective: Vital signs in last 24 hours: Temp:  [97.8 F (36.6 C)] 97.8 F (36.6 C) (02/19 0644) Pulse Rate:  [65-75] 75 (02/19 0644) Resp:  [18] 18 (02/19 0644) BP: (123-155)/(63-80) 155/80 mmHg (02/19 0644) SpO2:  [100 %] 100 % (02/19 0644) Weight:  [62.109 kg (136 lb 14.8 oz)] 62.109 kg (136 lb 14.8 oz) (02/19 0644)  Intake/Output from previous day: 02/18 0701 - 02/19 0700 In: -  Out: 1500 [Urine:1500] Intake/Output this shift: Total I/O In: -  Out: 900 [Urine:900]   Recent Labs  01/11/14 0605 01/12/14 0551 01/12/14 1350  HGB 8.0* 7.1* 8.0*    Recent Labs  01/12/14 0551 01/12/14 1350 01/12/14 2308  WBC 17.6* 19.0*  --   RBC 3.03* 3.42* 2.98*  HCT 22.2* 25.0*  --   PLT 385 424*  --     Recent Labs  01/12/14 0551 01/13/14 0413  NA 140 141  K 3.5* 3.6*  CL 105 104  CO2 23 24  BUN 30* 30*  CREATININE 2.05* 1.99*  GLUCOSE 123* 131*  CALCIUM 8.5 8.2*    Recent Labs  01/12/14 0551 01/13/14 0413  INR 1.48 2.08*    NAD, A/Ox3, appears stated age.  EOMI, mood and affect normal, respirations unlabored.  Dressing in proper placement with small amount of drainage from plantar ulcer.  No iodoform packing found in ulcer.  +erythema/edema at ulcer site.  small amount of purulent drainage expressed from ulcer.  +edema to forefoot and toes of left foot.  DP pulse 1+. Normal sensation to light touch intact b/l.    Assessment/Plan: Dressing changed today, ulcer packed with iodoform gauze, dry dressing placed.  Continue antibiotics, continue dressing changes at 7am each morning.  Continue medical management as dictated by Dr. Tommy Medal.  FLOWERS, CHRISTOPHER S 01/13/2014, 2:55 PM   If the patient's foot appearance doesn't improve we'll consider I and D v. Amputation.  This would likely be Sunday afternoon.  We'll  continue to follow.  In the meantime, new orders for packing the wound have been written.  The patient can bear weight as tolerated in the post op shoe.

## 2014-01-13 NOTE — Progress Notes (Signed)
Seen and agree  

## 2014-01-13 NOTE — Progress Notes (Signed)
Orthopedic Tech Progress Note Patient Details:  Joshua [name] [birth year] [id number]  Ortho Devices Type of Ortho Device: Postop shoe/boot Ortho Device/Splint Location: lle Ortho Device/Splint Interventions: Application   [name] 01/13/2014, 4:52 PM

## 2014-01-13 NOTE — Progress Notes (Signed)
Subjective: Doing well without complaints, would like to eat more food.  Objective: Vital signs in last 24 hours: Filed Vitals:   01/12/14 1345 01/12/14 2140 01/13/14 0644 01/13/14 1422  BP: 184/104 123/63 155/80 114/65  Pulse: 94 65 75 66  Temp: 97.6 F (36.4 C) 97.8 F (36.6 C) 97.8 F (36.6 C) 97.5 F (36.4 C)  TempSrc: Oral Oral Oral Oral  Resp: 20 18 18 20   Height:      Weight:   136 lb 14.8 oz (62.109 kg)   SpO2: 90% 100% 100% 94%   Weight change: -13 lb 8 oz (-6.124 kg)  Intake/Output Summary (Last 24 hours) at 01/13/14 1612 Last data filed at 01/13/14 1119  Gross per 24 hour  Intake      0 ml  Output   2400 ml  Net  -2400 ml   General: resting on chair, no distress HEENT: PERRL, EOMI, no scleral icterus Cardiac: RRR, no rubs, murmurs or gallops Pulm: clear to auscultation bilaterally, moving normal volumes of air Abd: soft, nontender, obese, distended, BS present Ext: warm and well perfused, 2+ pretibial pitting edema  Neuro: alert and oriented X3, cranial nerves II-XII grossly intact  Lab Results: Basic Metabolic Panel:  Recent Labs Lab 01/12/14 0551 01/13/14 0413  NA 140 141  K 3.5* 3.6*  CL 105 104  CO2 23 24  GLUCOSE 123* 131*  BUN 30* 30*  CREATININE 2.05* 1.99*  CALCIUM 8.5 8.2*   Liver Function Tests:  Recent Labs Lab 01/09/14 0428 01/11/14 1115  AST 66* 36  ALT 94* 66*  ALKPHOS 154* 236*  BILITOT 0.2* 0.4  PROT 5.9* 7.3  ALBUMIN 1.3* 1.6*   CBC:  Recent Labs Lab 01/12/14 0551 01/12/14 1350  WBC 17.6* 19.0*  HGB 7.1* 8.0*  HCT 22.2* 25.0*  MCV 73.3* 73.1*  PLT 385 424*   CBG:  Recent Labs Lab 01/11/14 2021 01/12/14 0747 01/12/14 1624 01/12/14 2140 01/13/14 0756 01/13/14 1156  GLUCAP 184* 123* 144* 196* 129* 124*   Coagulation:  Recent Labs Lab 01/10/14 1920 01/11/14 0605 01/12/14 0551 01/13/14 0413  LABPROT 13.8 15.5* 17.5* 22.7*  INR 1.08 1.26 1.48 2.08*    Micro Results: Recent Results (from  the past 240 hour(s))  CULTURE, BLOOD (ROUTINE X 2)     Status: None   Collection Time    01/07/14  8:15 AM      Result Value Ref Range Status   Specimen Description BLOOD LEFT ARM   Final   Special Requests BOTTLES DRAWN AEROBIC AND ANAEROBIC 10CC   Final   Culture  Setup Time     Final   Value: 01/07/2014 14:02     Performed at [facility]   Culture     Final   Value: STAPHYLOCOCCUS SPECIES (COAGULASE NEGATIVE)     Note: THE SIGNIFICANCE OF ISOLATING THIS ORGANISM FROM A SINGLE SET OF BLOOD CULTURES WHEN MULTIPLE SETS ARE DRAWN IS UNCERTAIN. PLEASE NOTIFY THE MICROBIOLOGY DEPARTMENT WITHIN ONE WEEK IF SPECIATION AND SENSITIVITIES ARE REQUIRED.     Note: Gram Stain Report Called to,Read Back By and Verified With: [facility] 01/08/14 @ 5:56PM BY RUSCOE A.     Performed at [facility]   Report Status 01/09/2014 FINAL   Final  CULTURE, BLOOD (ROUTINE X 2)     Status: None   Collection Time    01/07/14  8:30 AM      Result Value Ref Range Status   Specimen Description BLOOD  LEFT ARM   Final   Special Requests BOTTLES DRAWN AEROBIC AND ANAEROBIC 10CC   Final   Culture  Setup Time     Final   Value: 01/07/2014 14:02     Performed at [facility]   Culture     Final   Value: NO GROWTH 5 DAYS     Performed at [facility]   Report Status 01/13/2014 FINAL   Final  ANAEROBIC CULTURE     Status: None   Collection Time    01/11/14  8:31 AM      Result Value Ref Range Status   Specimen Description WOUND FOOT LEFT   Final   Special Requests     Final   Value: LEFT PLANTAR ULCER PT ON ZOSYN AND ZYVOX AND AUGMENTIN   Gram Stain     Final   Value: NO WBC SEEN     NO SQUAMOUS EPITHELIAL CELLS SEEN     NO ORGANISMS SEEN     Performed at [facility]   Culture     Final   Value: NO ANAEROBES ISOLATED; CULTURE IN PROGRESS FOR 5 DAYS     Performed at [facility]   Report Status PENDING   Incomplete  WOUND CULTURE     Status: None    Collection Time    01/11/14  8:31 AM      Result Value Ref Range Status   Specimen Description WOUND FOOT LEFT   Final   Special Requests LEFT PLANTAR ULCER PT ON ZYVOX,ZOSYN AND AUGMENTIN   Final   Gram Stain     Final   Value: NO WBC SEEN     NO SQUAMOUS EPITHELIAL CELLS SEEN     NO ORGANISMS SEEN     Performed at [facility]   Culture     Final   Value: FEW STAPHYLOCOCCUS AUREUS     Note: RIFAMPIN AND GENTAMICIN SHOULD NOT BE USED AS SINGLE DRUGS FOR TREATMENT OF STAPH INFECTIONS.     Performed at [facility]   Report Status PENDING   Incomplete   Studies/Results: Nm Hepatobiliary  01/12/2014   CLINICAL DATA:  Upper abdominal pain  EXAM: NUCLEAR MEDICINE HEPATOBILIARY IMAGING  Views:  Anterior right upper quadrant  Radionuclide:  Technetium 62m Choletec  Dose:  5.0 mCi  Route of administration: Intravenous  COMPARISON:  None.  FINDINGS: Liver uptake of radiotracer is normal. There is prompt visualization of gallbladder and small bowel, indicating patency of the cystic and common bile ducts.  IMPRESSION: Study within normal limits.   Electronically Signed   By: Lowella Grip M.D.   On: 01/12/2014 13:14   Medications: I have reviewed the patient's current medications. Scheduled Meds: . carvedilol  6.25 mg Oral BID WC  . ferrous sulfate  325 mg Oral TID WC  . furosemide  80 mg Intravenous BID  . hydrALAZINE  50 mg Oral 3 times per day  . insulin aspart  0-9 Units Subcutaneous TID WC  . isosorbide mononitrate  30 mg Oral Daily  . levETIRAcetam  500 mg Intravenous Q12H  . metolazone  2.5 mg Oral Once  . pantoprazole  20 mg Oral QAC breakfast  . potassium chloride  20 mEq Oral BID  . verapamil  120 mg Oral Daily  . warfarin  2.5 mg Oral ONCE-1800  . Warfarin - Pharmacist Dosing Inpatient   Does not apply q1800   Continuous Infusions: . sodium chloride 10 mL/hr at 01/13/14  1119  . heparin 1,750 Units/hr (01/13/14 1104)   PRN Meds:.sodium  chloride Assessment/Plan:  #Acalculous Cholecystitis: Hemodynamically stable, Negative HIDA, sepsis resolved -Appreciate surg recs, no surgical intervention now (thought would benefit, not a candidate) --> surgery has signed off -D/c IV unasyn -Advance diet - low fat  #Diabetic foot ulcer: s/p debridement by ortho -Appreciate ortho recs -Zosyn per pharmacy -Arterial dopplers  #Microcytic Iron def Anemia: Hb stable. -Appreciate GI recs -As he becomes more stable, we will pursue barium enema & upper GI -PPI, Iron supp -AM CBC  #Acute on chronic diastolic HF: Inaccurate weights, I/Os; concern for amyloid  -Appreciate HF team recs --> lasix 80 IV BID + metolazone today, cont coreg  #Acute on chronic renal failure: stable, likely related to heart failure -continue to monitor with diuresis  -holding ACEI for now -am bmet  #HTN: stable. -Cont verapamil, hydralazine (increased to 50 tid per cards)  #Acute DVT: coumadin per pharmacy  #Seizure d/o: Patient has only had one seizure, will plan to taper Keppra -Keppra taper per pharm  #DM: HbA1c 6.5 on 11/26/13 -SSI  #VTE ppx: coumadin  ---> REMAINDER PER RUI JIANG, MSIV NOTE  Dispo: Disposition is deferred at this time, awaiting improvement of current medical problems.  Anticipated discharge in approximately 2-3 day(s).   The patient does have a current PCP Duwaine Maxin, DO) and does need an [facility] hospital follow-up appointment after discharge.  The patient does not have transportation limitations that hinder transportation to clinic appointments.  .Services Needed at time of discharge: Y = Yes, Blank = No PT:   OT:   RN:   Equipment:   Other:     LOS: 7 days   Othella Boyer, MD 01/13/2014, 4:12 PM

## 2014-01-13 NOTE — Progress Notes (Signed)
DRUG ALERT : KEPPRA - Request regarding discontinuation of Keppra IV.  - Although there are no defined protocols, it is recommended that Keppra be discontinued/tapered gradually. This does not have to take a long time. - RECOMMENDATION: Cut the dose of Keppra by half x 1 day, then discontinue Keppra altogether.  Stramoski, Craig Guess, Pharm.D. , 01/13/2014, 4:10 PM

## 2014-01-13 NOTE — Progress Notes (Addendum)
ANTICOAGULATION/ANTIBIOTIC CONSULT NOTE - Follow Up Consult  Pharmacy Consult for Heparin + Coumadin; Unasyn Indication: DVT; diabetic foot ulcer  No Known Allergies  Patient Measurements: Height: 5\' 6"  (167.6 cm) Weight: 136 lb 14.8 oz (62.109 kg) IBW/kg (Calculated) : 63.8 Heparin Dosing Weight: 62kg  Vital Signs: Temp: 97.8 F (36.6 C) (02/19 0644) Temp src: Oral (02/19 0644) BP: 155/80 mmHg (02/19 0644) Pulse Rate: 75 (02/19 0644)  Labs:  Recent Labs  01/11/14 0605 01/12/14 0551 01/12/14 1350 01/13/14 0413  HGB 8.0* 7.1* 8.0*  --   HCT 25.3* 22.2* 25.0*  --   PLT 420* 385 424*  --   LABPROT 15.5* 17.5*  --  22.7*  INR 1.26 1.48  --  2.08*  HEPARINUNFRC 0.37 0.53  --  0.44  CREATININE 1.77* 2.05*  --  1.99*    Estimated Creatinine Clearance: 33.8 ml/min (by C-G formula based on Cr of 1.99).   Medications:  Heparin @ 1750 units/hr  Assessment: 62yom continues on heparin/coumadin overlap day #4 for LLE DVT (01/07/14). Heparin level is at goal. INR is also at goal, but as jumped with three 7.5mg  doses. Hgb remains low but improved, no bleeding, GI evaluation deferred for now.  Also continues on day #3 unasyn for diabetic foot ulcer. Noted plan per IM team to change to zosyn or po augmentin. Unasyn dose remains appropriate for renal function.  Zosyn 2/13 >> 2/16 Zyvox 2/13 >> 2/16 2/17 Unasyn>>  2/13 blood x2 > staph, coag [-] 1/2 2/17 L foot wound > few staph aureus  Goal of Therapy:  INR 2-3 Heparin level 0.3-0.7 units/ml Monitor platelets by anticoagulation protocol: Yes   Plan:  1) Continue heparin at 1750 units/hr 2) Decrease coumadin to 2.5mg  x 1 3) Continue unasyn 3g IV q8 4) Follow up heparin level, INR, CBC in AM 5) Follow up cultures, renal function, antibiotic de-escalation  **He will need at least one more day of full overlap with heparin and coumadin**  Deboraha Sprang 01/13/2014,1:40 PM  Addendum: Changing Unasyn back to Zosyn  for diabetic wound infection.  Plan: Restart Zosyn 3.375g IV q8h- each dose over 4 hrs.   Sloan Leiter, PharmD, BCPS Clinical Pharmacist [phone] 01/13/2014, 4:22 PM

## 2014-01-13 NOTE — Progress Notes (Signed)
Medical Student Daily Progress Note  Subjective: No events overnight. No increase of SOB, no worsening of pedal edema.   SIGNIFICANT EVENTS / STUDIES:  2/12 Admitted to IMTS with dx of infected diabetic foot ulcer, R/O osteromyelitis  2/13 Transferred to ICU with AMS, hypotension, bradycardia after verapamil  2/13 Witnessed seizure after transfer to ICU  2/13 LE venous duplex: positive for bilat peroneal DVTs  2/13 CT head >>> nad  2/13 CT chest >>> Bilateral effusions, mild edema, "cut off sign" RLL bronchus, non-specific adenopathy  2/14 Bronchoscopy >>> no endobronchial lesions, severe tracheobronchomalacia  2/14 Venous Doppler LE >>> Bilateral acute DVT  2/14 RUQ [place] >>> Findings are worrisome for acute acalculous cholecystitis (1.1cm gallbladder wall thickening)  2/14 Liver Doppler >>> no evidence of portal vein thrombosis or hepatic veno-occlusive disease  2/14 TTE >>> EF 55, grade 2 diastolic dysfunction, mildly dilated RV with moderately reduced function, PAP 49 torr  2/15 V/Q scan >>> no PE, very low probability for pulmonary embolism  2/15 MRI of Left foot >>> no osteo, diffuse cellulitis, myositis, small 1st MTP joint effusion that is likely aseptic and incidental, osteonecrosis of first MTP joint  2/18 HIDA scan >>> no abnormalities in hepaticbiliary tree  2/18 FOBT x2 >>> positive   LINES / TUBES:  OETT 2/13 >>> 2/14  OGT 2/13 >>> 2/14  R [place] CVL 2/13 >>> 2/17  Foley 2/13 >>> 2/16   CULTURES:  Blood 2/13 >>> 1 out of 2 positive for Staphylococcus species, coagulase negative   ANTIBIOTICS:  Vanc 2/12 >>> 2/13  Linezolid 2/13 >>> 2/16  Pip-tazo 2/13 >>> 2/16  Augmentin 2/16 >>> 2/17  Unasyn 2/17 >>> 2/19 Zosyn 2/19 >>>   Objective: Vital signs in last 24 hours: Filed Vitals:   01/12/14 0423 01/12/14 1345 01/12/14 2140 01/13/14 0644  BP: 150/77 184/104 123/63 155/80  Pulse: 69 94 65 75  Temp: 97.4 F (36.3 C) 97.6 F (36.4 C) 97.8 F (36.6 C) 97.8 F (36.6 C)   TempSrc: Oral Oral Oral Oral  Resp: '18 20 18 18  ' Height:      Weight: 68.233 kg (150 lb 6.8 oz)   62.109 kg (136 lb 14.8 oz)  SpO2: 100% 90% 100% 100%   Weight change: -6.124 kg (-13 lb 8 oz)  Intake/Output Summary (Last 24 hours) at 01/13/14 0816 Last data filed at 01/13/14 0645  Gross per 24 hour  Intake      0 ml  Output   1500 ml  Net  -1500 ml   Physical Exam: BP 155/80  Pulse 75  Temp(Src) 97.8 F (36.6 C) (Oral)  Resp 18  Ht '5\' 6"'  (1.676 m)  Wt 62.109 kg (136 lb 14.8 oz)  BMI 22.11 kg/m2  SpO2 100% General appearance: alert, cooperative and no distress Neck: JVD - 6 cm above sternal notch, when sitting upright, JVD up to just below the mandible Lungs: clear to auscultation bilaterally and Maybe some diminished breath sounds in bilateral lower lobes, no crackles or rhonchi heard Heart: Either faint S4 (more likely S4 given dHF), or minimal S1 split heard, regular rate, no murmurs Abdomen: soft, non-tender; bowel sounds normal; no masses,  no organomegaly Extremities: 2+ pedal edema Pulses: 2+ and symmetric Lab Results: BMET    Component Value Date/Time   NA 141 01/13/2014 0413   K 3.6* 01/13/2014 0413   CL 104 01/13/2014 0413   CO2 24 01/13/2014 0413   GLUCOSE 131* 01/13/2014 0413   BUN 30* 01/13/2014 0413  CREATININE 1.99* 01/13/2014 0413   CREATININE 1.14 01/05/2014 1616   CALCIUM 8.2* 01/13/2014 0413   GFRNONAA 34* 01/13/2014 0413   GFRAA 40* 01/13/2014 0413    Lab Results  Component Value Date   RETICCTPCT 0.5 01/12/2014   Heparin level: 0.44  Lab Results  Component Value Date   INR 2.08* 01/13/2014   INR 1.48 01/12/2014   INR 1.26 01/11/2014     Micro Results: Recent Results (from the past 240 hour(s))  CULTURE, BLOOD (ROUTINE X 2)     Status: None   Collection Time    01/07/14  8:15 AM      Result Value Ref Range Status   Specimen Description BLOOD LEFT ARM   Final   Special Requests BOTTLES DRAWN AEROBIC AND ANAEROBIC 10CC   Final   Culture   Setup Time     Final   Value: 01/07/2014 14:02     Performed at [facility]   Culture     Final   Value: STAPHYLOCOCCUS SPECIES (COAGULASE NEGATIVE)     Note: THE SIGNIFICANCE OF ISOLATING THIS ORGANISM FROM A SINGLE SET OF BLOOD CULTURES WHEN MULTIPLE SETS ARE DRAWN IS UNCERTAIN. PLEASE NOTIFY THE MICROBIOLOGY DEPARTMENT WITHIN ONE WEEK IF SPECIATION AND SENSITIVITIES ARE REQUIRED.     Note: Gram Stain Report Called to,Read Back By and Verified With: [facility] 01/08/14 @ 5:56PM BY RUSCOE A.     Performed at [facility]   Report Status 01/09/2014 FINAL   Final  CULTURE, BLOOD (ROUTINE X 2)     Status: None   Collection Time    01/07/14  8:30 AM      Result Value Ref Range Status   Specimen Description BLOOD LEFT ARM   Final   Special Requests BOTTLES DRAWN AEROBIC AND ANAEROBIC 10CC   Final   Culture  Setup Time     Final   Value: 01/07/2014 14:02     Performed at [facility]   Culture     Final   Value:        BLOOD CULTURE RECEIVED NO GROWTH TO DATE CULTURE WILL BE HELD FOR 5 DAYS BEFORE ISSUING A FINAL NEGATIVE REPORT     Performed at [facility]   Report Status PENDING   Incomplete  ANAEROBIC CULTURE     Status: None   Collection Time    01/11/14  8:31 AM      Result Value Ref Range Status   Specimen Description WOUND FOOT LEFT   Final   Special Requests     Final   Value: LEFT PLANTAR ULCER PT ON ZOSYN AND ZYVOX AND AUGMENTIN   Gram Stain     Final   Value: NO WBC SEEN     NO SQUAMOUS EPITHELIAL CELLS SEEN     NO ORGANISMS SEEN     Performed at [facility]   Culture     Final   Value: NO ANAEROBES ISOLATED; CULTURE IN PROGRESS FOR 5 DAYS     Performed at [facility]   Report Status PENDING   Incomplete  WOUND CULTURE     Status: None   Collection Time    01/11/14  8:31 AM      Result Value Ref Range Status   Specimen Description WOUND FOOT LEFT   Final   Special Requests LEFT PLANTAR ULCER PT ON ZYVOX,ZOSYN  AND AUGMENTIN   Final   Gram Stain     Final  Value: NO WBC SEEN     NO SQUAMOUS EPITHELIAL CELLS SEEN     NO ORGANISMS SEEN     Performed at [facility]   Culture     Final   Value: Culture reincubated for better growth     Performed at [facility]   Report Status PENDING   Incomplete   Studies/Results: Nm Hepatobiliary  01/12/2014   CLINICAL DATA:  Upper abdominal pain  EXAM: NUCLEAR MEDICINE HEPATOBILIARY IMAGING  Views:  Anterior right upper quadrant  Radionuclide:  Technetium 65mCholetec  Dose:  5.0 mCi  Route of administration: Intravenous  COMPARISON:  None.  FINDINGS: Liver uptake of radiotracer is normal. There is prompt visualization of gallbladder and small bowel, indicating patency of the cystic and common bile ducts.  IMPRESSION: Study within normal limits.   Electronically Signed   By: WLowella GripM.D.   On: 01/12/2014 13:14   Medications: I have reviewed the patient's current medications. Scheduled Meds: . ampicillin-sulbactam (UNASYN) IVPB 3 g  3 g Intravenous Q8H  . carvedilol  6.25 mg Oral BID WC  . ferrous sulfate  325 mg Oral TID WC  . furosemide  80 mg Intravenous BID  . hydrALAZINE  25 mg Oral 3 times per day  . insulin aspart  0-9 Units Subcutaneous TID WC  . isosorbide mononitrate  30 mg Oral Daily  . levETIRAcetam  500 mg Intravenous Q12H  . pantoprazole  20 mg Oral QAC breakfast  . potassium chloride  20 mEq Oral BID  . verapamil  120 mg Oral Daily  . Warfarin - Pharmacist Dosing Inpatient   Does not apply q1800   Continuous Infusions: . sodium chloride 20 mL/hr (01/10/14 0537)  . heparin 1,750 Units/hr (01/12/14 1940)   PRN Meds:.sodium chloride Assessment/Plan: Principal Problem:   Acute acalculous cholecystitis Active Problems:   Hypertension   Diabetes mellitus with renal manifestation   Hypoalbuminemia   Diabetic nephropathy with proteinuria   Cellulitis   Acute respiratory failure   New onset seizure   Acute renal  failure   Elevated alkaline phosphatase level   Microcytic anemia   DVT of popliteal vein   Acute on chronic diastolic heart failure   Possible GI bleeding   LOS: 7 days   Mr. Currie is a 89yoman with history of T2DM, HTN, L foot ulcer, previously treated extrapulmonary TB who has returned to IMTS from ICU with sepsis possibly due to acalculous cholecystitis, dCHF, AKI on CKD with nephrotic syndrome, iron-deficiency anemia and symptomatic distal DVT w/o PE.   # Septic shock likely due to acalculous cholecystitis: WBC of 19.0 while on antibiotics. Acalculous cholecystitis was found on RUQ ultrasound with gallbladder wall thickening of 1.1cm. Patient has had consistently elevated alkaline phosphate since November, and his GGT was elevated in 2013 as well as during this admission, indicating liver as source of elevation in alk phos. AST and ALT are now normal after a brief elevation during his ICU stay AST (158) and ALT (128). TBili and DBili remained normal. The ICU setting can lead to acute acalculous cholecystitis, but the 61-monthong symptom with consistently elevated alk phos suggests an underlying gallbladder disorder, most likely cholestasis from DM. Peptic ulcers are possible given history of RUQ pain for 6 months, but please see below for management. Other causes for sepsis that are ruled out are systemic bacteremia (only 1 out of 2 cultures with coagulase negative staph) and osteomyelitis (MRI does not show osteomyelitis).  Plan  -  Surgery consult: patient is not a surgical candidate, and HIDA does not show hepatobiliary abnormalities. Surgery does not recommend antibiotics as they believe that it is a biliary disease, likely biliary dyskinesia or chronic cholecystitis. They recommend outpatient follow up for cholecystectomy once patient is medically stable.  - Stop IV Unasyn because heart failure team has advised to switch to another abx given high Na load with Unasyn in the setting of fluid  overload and raising creatinine.  - Start IV Zosyn with dosing per pharamcy consult --> consider discontinue, but patient's WBC is still elevated. Will consider CXR and UA to look for source of infection in AM. - PPI 73m PO qD   # AKI on CKD with nephrotic syndrome in the setting of dCHF: Cr stabilizing at 1.99 from 2.05 yesterday (up from 0.8 on admission). FE Urea of 39%, FENa of 1.7%, BUN/Cr ratio < 20, and proteinuria indicating an intra-renal cause of AKI, but the sudden increase in Cr from normal Cr during hospitalization indicates a pre-renal cause especially given patient's dCHF and furosemide use. Liver cause of low albumin is ruled out given normal ultrasound scan and relatively normal LFTs for hepatic cause. Intra renal causes include diabetic nephropathy (urine albumin/cr 8434.2) with nephrotic syndrome. UPEP returned with elevated light chains, but the kappa/lambda ratio is normal at 4, ruling out any monoclonal gammopathy. However, current AKI is most likely caused by [facility] aggressive [facility]uresis improved Cr from 2.05 to 1.99. - Heart failure team recs appreciated  - Consider Nephrology consult to manage diabetic nephropathy  - Hold off ACEI until AKI resolves   # dCHF: Daily weight: 136lb (admission wt 151lb); I/O (-1.5L today, +6.3 from admission); Cr 1.99 (baseline 0.8); patient presents with bilateral pleural effusion on CT, peripheral edema, JVD, SOB, ARF, EF of 55, and grade 2 diastolic dysfunction on ECHO. SOB is improving with furosemide, and Cr improved slightly from 2.05 to 1.99 today. Patient is able to make urine, and only had one dose of 888mIV lasix yesterday (2/18). - Appreciate heart failure team's recs to continue aggressive diuresis with furosemide 8063m BID, add one dose of metolazone, and change Unasyn to Zosyn  - Verapamil to 120m51mily  - Coreg 6.25 mg bid - no change as patient is in acute CHF exacerbation - Furosemide 80mg57mBID - One dose of metolazone - hold  off ACEI with AKI  - Follow up on Renal Labs  - Will aim for negative or even fluid balance. He is still positive since admission   # HTN: patient had several episode of BP in 180/100's. Goal pressure for proteinuric CKD is <130/80. With heart failure team's reocmmendation, we will increase hydralazine to 50mg 52m  - Furosemide 80mg I57mD - Verapamil 120mg qD54marvedilol 6.25mg BID51mydralazine 50mg TID 47mIron-deficiency Anemia: hemoglobin of 8.2 in 2015, but 13.9 in 2010. Hgb is stable at 8-9. Iron panel shows low iron stores (Iron 19, Ferritin 179), and inappropriately low retic index (0.2). FOBT is positive x2. Patient has been having RUQ pain with dark stools for 6 months or more. Likely causes include peptic ulcer bleed and lower GI bleed, diabetic nephropathy, and multiple myeloma. No NSAIDs were used at home. No colonoscopy on file, but there was 3 negative FOBT in 10/2012.  -GI recommends air-constrat barium enema to look for significant lesions in colon, and an upper GI series for peptic ulcers. These studies will not interfere with patient's anticoagulation regimen. GI  want to defer GI evlauation until patient's cardiopulmonary status has improved, possibility during outpatient follow up. -Protonix 53m PO qD: upper GI bleed and RUQ pain after eating  -Ferrous sulfate 3278mtablet TID  # Hypoalbuminemia:  Patient has albumin of 1.7 with protein gap of 5.3 from total protein. Patient has a baseline hypoalbuminemia. However, UPEP showed elevated kappa and lambda free light chains, but the kappa/lambda ratio is normal (4). We are less concerned for paraprotein phenomenon even though patient has anemia, AKI, and elevated alkaline phosphate. Most likely cause is nephrotic syndrome. Patient does not appear to have liver disease given normal liver appearance on ultrasound and normal AST/ALT, and the acalculous cholecystitis can explain the elevated alk phos level.  -SPEP follow-up   #  Acute DVT: Patient was found to have bilateral acute DVT without PE. Therapeutic on heparin with UFH of 0.44 and goal of 0.3-0.7. Therapeutic on Warfarin with INR of 2.08. Anticoagulation team following. If patient undergoes surgery, anticoagulation team will be notified to manage anticoagulation  -Anticoagulation team recs appreciated  - on Hep gtt  - Continue coumadin per pharmacy  - will likely require anticoagulants for three months for first time symptomatic acute DVT   # Diabetic foot ulcer: MRI of L foot did not show osteomyelitis, but did show local cellulitis and possible myositis. Ortho performed debridement, obtained wound culture, and packed wound to prevent abscess formation. Wound culture showed some Staph species, which are likely contaminants, and no anaerobes.  -Zosyn IV should cover typical species found in wounds  -Wound packing to prevent abscess  # Seizure: EEG showed medication effect. This is patient's first seizure in the setting of acute respiratory failure. This is most likely an isolated event. Patient does not have a history of substance abuse, and labs shows that electrolytes are in normal ranges.  -Taper Keppra over 2 weeks, propose tapering by 25012mer 3 days.  -Neuro check   Proph:  DVT: already on UFH  Mobility: PT consulted to help patient to recondition/ambulate    This is a [employer] information officerte.  The care of the patient was discussed with Dr. ComLinus Salmonsd the assessment and plan formulated with their assistance.  Please see their attached note for official documentation of the daily encounter.  JiaKonrad Saha19/2015, 8:16 AM

## 2014-01-13 NOTE — Progress Notes (Signed)
With the patient's current clinical situation with heart failure I would recommend deferring GI evaluation until his cardiopulmonary status has improved. He is on Coumadin. I would recommend further evaluation of his GI tract with barium enema and upper GI rather than an endoscopic evaluation. See my prior notes. This could even be done as an outpatient.

## 2014-01-13 NOTE — Progress Notes (Signed)
Patient ID: Joshua [name], male   DOB: Sep 25, 1951, 63 y.o.   MRN: [id number]    Subjective: Pt feels well today.  No abdominal pain.  Would like more to eat than just clear liquids.  No pain with clears.  Objective: Vital signs in last 24 hours: Temp:  [97.6 F (36.4 C)-97.8 F (36.6 C)] 97.8 F (36.6 C) (02/19 0644) Pulse Rate:  [65-94] 75 (02/19 0644) Resp:  [18-20] 18 (02/19 0644) BP: (123-184)/(63-104) 155/80 mmHg (02/19 0644) SpO2:  [90 %-100 %] 100 % (02/19 0644) Weight:  [136 lb 14.8 oz (62.109 kg)] 136 lb 14.8 oz (62.109 kg) (02/19 0644) Last BM Date: 01/09/14  Intake/Output from previous day: 02/18 0701 - 02/19 0700 In: -  Out: 1500 [Urine:1500] Intake/Output this shift:    PE: Abd: soft, NT, Nd, +BS  Lab Results:   Recent Labs  01/12/14 0551 01/12/14 1350  WBC 17.6* 19.0*  HGB 7.1* 8.0*  HCT 22.2* 25.0*  PLT 385 424*   BMET  Recent Labs  01/12/14 0551 01/13/14 0413  NA 140 141  K 3.5* 3.6*  CL 105 104  CO2 23 24  GLUCOSE 123* 131*  BUN 30* 30*  CREATININE 2.05* 1.99*  CALCIUM 8.5 8.2*   PT/INR  Recent Labs  01/12/14 0551 01/13/14 0413  LABPROT 17.5* 22.7*  INR 1.48 2.08*   CMP     Component Value Date/Time   NA 141 01/13/2014 0413   K 3.6* 01/13/2014 0413   CL 104 01/13/2014 0413   CO2 24 01/13/2014 0413   GLUCOSE 131* 01/13/2014 0413   BUN 30* 01/13/2014 0413   CREATININE 1.99* 01/13/2014 0413   CREATININE 1.14 01/05/2014 1616   CALCIUM 8.2* 01/13/2014 0413   PROT 7.3 01/11/2014 1115   ALBUMIN 1.6* 01/11/2014 1115   AST 36 01/11/2014 1115   ALT 66* 01/11/2014 1115   ALKPHOS 236* 01/11/2014 1115   BILITOT 0.4 01/11/2014 1115   GFRNONAA 34* 01/13/2014 0413   GFRAA 40* 01/13/2014 0413   Lipase     Component Value Date/Time   LIPASE 18 01/08/2014 0400       Studies/Results: Nm Hepatobiliary  01/12/2014   CLINICAL DATA:  Upper abdominal pain  EXAM: NUCLEAR MEDICINE HEPATOBILIARY IMAGING  Views:  Anterior right upper quadrant   Radionuclide:  Technetium 77m Choletec  Dose:  5.0 mCi  Route of administration: Intravenous  COMPARISON:  None.  FINDINGS: Liver uptake of radiotracer is normal. There is prompt visualization of gallbladder and small bowel, indicating patency of the cystic and common bile ducts.  IMPRESSION: Study within normal limits.   Electronically Signed   By: Lowella Grip M.D.   On: 01/12/2014 13:14    Anti-infectives: Anti-infectives   Start     Dose/Rate Route Frequency Ordered Stop   01/11/14 1015  Ampicillin-Sulbactam (UNASYN) 3 g in sodium chloride 0.9 % 100 mL IVPB     3 g 100 mL/hr over 60 Minutes Intravenous Every 8 hours 01/11/14 1003     01/10/14 2200  amoxicillin-clavulanate (AUGMENTIN) 875-125 MG per tablet 1 tablet  Status:  Discontinued     1 tablet Oral Every 12 hours 01/10/14 1817 01/11/14 0904   01/08/14 1300  piperacillin-tazobactam (ZOSYN) IVPB 3.375 g  Status:  Discontinued     3.375 g 12.5 mL/hr over 240 Minutes Intravenous Every 8 hours 01/08/14 1206 01/10/14 1817   01/07/14 1730  linezolid (ZYVOX) IVPB 600 mg  Status:  Discontinued    Comments:  sepsis  600 mg 300 mL/hr over 60 Minutes Intravenous Every 12 hours 01/07/14 1705 01/10/14 1817   01/07/14 1600  vancomycin (VANCOCIN) IVPB 1000 mg/200 mL premix  Status:  Discontinued     1,000 mg 200 mL/hr over 60 Minutes Intravenous STAT 01/07/14 1530 01/07/14 1800   01/07/14 1600  piperacillin-tazobactam (ZOSYN) IVPB 3.375 g     3.375 g 12.5 mL/hr over 240 Minutes Intravenous STAT 01/07/14 1530 01/07/14 2100       Assessment/Plan  1. Biliary disease, likely biliary dyskinesia or chronic cholecystitis  Plan: 1. The patient does not have acute cholecystitis, therefore ideally does not need abx therapy for his gallbladder.  He would benefit from cholecystectomy, but is not a candidate at this time due to his medical problems.   2. Advance to low fat diet 3. He should follow up with [place] as an outpatient once medically  stable to consider cholecystectomy.  We will sign off.  Please call as needed.  LOS: 7 days    Twana Wileman E 01/13/2014, 11:15 AM Pager: [phone]

## 2014-01-14 ENCOUNTER — Inpatient Hospital Stay (HOSPITAL_COMMUNITY): Payer: Self-pay

## 2014-01-14 DIAGNOSIS — R609 Edema, unspecified: Secondary | ICD-10-CM

## 2014-01-14 DIAGNOSIS — N049 Nephrotic syndrome with unspecified morphologic changes: Secondary | ICD-10-CM | POA: Diagnosis present

## 2014-01-14 DIAGNOSIS — L97509 Non-pressure chronic ulcer of other part of unspecified foot with unspecified severity: Secondary | ICD-10-CM

## 2014-01-14 DIAGNOSIS — I129 Hypertensive chronic kidney disease with stage 1 through stage 4 chronic kidney disease, or unspecified chronic kidney disease: Secondary | ICD-10-CM

## 2014-01-14 DIAGNOSIS — E1149 Type 2 diabetes mellitus with other diabetic neurological complication: Secondary | ICD-10-CM

## 2014-01-14 DIAGNOSIS — R601 Generalized edema: Secondary | ICD-10-CM | POA: Diagnosis present

## 2014-01-14 LAB — PROTEIN ELECTROPHORESIS, SERUM
ALPHA-1-GLOBULIN: 9.9 % — AB (ref 2.9–4.9)
ALPHA-2-GLOBULIN: 19.1 % — AB (ref 7.1–11.8)
Albumin ELP: 27.3 % — ABNORMAL LOW (ref 55.8–66.1)
BETA GLOBULIN: 7.3 % — AB (ref 4.7–7.2)
Beta 2: 10.9 % — ABNORMAL HIGH (ref 3.2–6.5)
GAMMA GLOBULIN: 25.5 % — AB (ref 11.1–18.8)
M-Spike, %: NOT DETECTED g/dL
Total Protein ELP: 5.7 g/dL — ABNORMAL LOW (ref 6.0–8.3)

## 2014-01-14 LAB — URINALYSIS, ROUTINE W REFLEX MICROSCOPIC
Bilirubin Urine: NEGATIVE
GLUCOSE, UA: 100 mg/dL — AB
Hgb urine dipstick: NEGATIVE
Ketones, ur: NEGATIVE mg/dL
LEUKOCYTES UA: NEGATIVE
Nitrite: NEGATIVE
PH: 5 (ref 5.0–8.0)
Protein, ur: 100 mg/dL — AB
Specific Gravity, Urine: 1.016 (ref 1.005–1.030)
Urobilinogen, UA: 0.2 mg/dL (ref 0.0–1.0)

## 2014-01-14 LAB — GLUCOSE, CAPILLARY
GLUCOSE-CAPILLARY: 193 mg/dL — AB (ref 70–99)
GLUCOSE-CAPILLARY: 208 mg/dL — AB (ref 70–99)
GLUCOSE-CAPILLARY: 138 mg/dL — AB (ref 70–99)

## 2014-01-14 LAB — WOUND CULTURE: Gram Stain: NONE SEEN

## 2014-01-14 LAB — CBC
HCT: 21.5 % — ABNORMAL LOW (ref 39.0–52.0)
HCT: 25 % — ABNORMAL LOW (ref 39.0–52.0)
Hemoglobin: 7 g/dL — ABNORMAL LOW (ref 13.0–17.0)
Hemoglobin: 8.1 g/dL — ABNORMAL LOW (ref 13.0–17.0)
MCH: 23.5 pg — AB (ref 26.0–34.0)
MCH: 23.5 pg — ABNORMAL LOW (ref 26.0–34.0)
MCHC: 32.6 g/dL (ref 30.0–36.0)
MCHC: 32.4 g/dL (ref 30.0–36.0)
MCV: 72.1 fL — ABNORMAL LOW (ref 78.0–100.0)
MCV: 72.5 fL — AB (ref 78.0–100.0)
PLATELETS: 404 10*3/uL — AB (ref 150–400)
PLATELETS: 498 10*3/uL — AB (ref 150–400)
RBC: 2.98 MIL/uL — ABNORMAL LOW (ref 4.22–5.81)
RBC: 3.45 MIL/uL — ABNORMAL LOW (ref 4.22–5.81)
RDW: 17.8 % — AB (ref 11.5–15.5)
RDW: 18 % — ABNORMAL HIGH (ref 11.5–15.5)
WBC: 14.8 10*3/uL — ABNORMAL HIGH (ref 4.0–10.5)
WBC: 17.5 10*3/uL — AB (ref 4.0–10.5)

## 2014-01-14 LAB — BASIC METABOLIC PANEL
BUN: 31 mg/dL — AB (ref 6–23)
CALCIUM: 8.4 mg/dL (ref 8.4–10.5)
CO2: 25 mEq/L (ref 19–32)
Chloride: 103 mEq/L (ref 96–112)
Creatinine, Ser: 2.28 mg/dL — ABNORMAL HIGH (ref 0.50–1.35)
GFR calc Af Amer: 34 mL/min — ABNORMAL LOW (ref >90)
GFR, EST NON AFRICAN AMERICAN: 29 mL/min — AB (ref >90)
Glucose, Bld: 134 mg/dL — ABNORMAL HIGH (ref 70–99)
Potassium: 3.9 mEq/L (ref 3.7–5.3)
SODIUM: 141 meq/L (ref 137–147)

## 2014-01-14 LAB — HEPARIN LEVEL (UNFRACTIONATED): HEPARIN UNFRACTIONATED: 0.61 [IU]/mL (ref 0.30–0.70)

## 2014-01-14 LAB — CREATININE, URINE, RANDOM: Creatinine, Urine: 48.97 mg/dL

## 2014-01-14 LAB — URINE MICROSCOPIC-ADD ON

## 2014-01-14 LAB — PROTIME-INR
INR: 3.92 — ABNORMAL HIGH (ref 0.00–1.49)
Prothrombin Time: 36.9 seconds — ABNORMAL HIGH (ref 11.6–15.2)

## 2014-01-14 LAB — ANTITHROMBIN III: AntiThromb III Func: 85 % (ref 75–120)

## 2014-01-14 LAB — SODIUM, URINE, RANDOM: Sodium, Ur: 77 mEq/L

## 2014-01-14 LAB — CK: CK TOTAL: 155 U/L (ref 7–232)

## 2014-01-14 MED ORDER — CEPHALEXIN 500 MG PO CAPS
500.0000 mg | ORAL_CAPSULE | Freq: Three times a day (TID) | ORAL | Status: DC
  Administered 2014-01-14 – 2014-01-20 (×19): 500 mg via ORAL
  Filled 2014-01-14 (×22): qty 1

## 2014-01-14 MED ORDER — HEPARIN (PORCINE) IN NACL 100-0.45 UNIT/ML-% IJ SOLN
1600.0000 [IU]/h | INTRAMUSCULAR | Status: DC
Start: 2014-01-14 — End: 2014-01-15
  Administered 2014-01-14 (×2): 1600 [IU]/h via INTRAVENOUS
  Filled 2014-01-14 (×4): qty 250

## 2014-01-14 MED ORDER — CEPHALEXIN 500 MG PO CAPS: 500.0000 mg | ORAL_CAPSULE | Freq: Four times a day (QID) | ORAL | Status: DC

## 2014-01-14 NOTE — Progress Notes (Signed)
Rehab Admissions Coordinator Note:  Patient was screened by Cleatrice Burke for appropriateness for an Inpatient Acute Rehab Consult.  At this time, we are recommending Inpatient Rehab consult.  Cleatrice Burke 01/14/2014, 2:57 PM  I can be reached at [phone].

## 2014-01-14 NOTE — Progress Notes (Addendum)
Advanced Heart Failure Rounding Note  Referring Physician: Teaching Service  Primary Physician: Dr. Redmond Pulling (Internal Medicine)  Primary Cardiologist: N/A  Reason for Consultation: Diastolic HF  Subjective:    Joshua [name] is a 63 yo male with a history of HTN, DM2, previous treatment for extrapulmonary TB, diabetic neuropathy, HTN, diastolic HF, L DVT bilateral peroneal veins (12/2013) and chronic L plantar foot ulcer for over a year.   Pt was admitted for worsening foot ulcer with increasing pain and chills. He had AMS with hypoxia and hypotension and was transferred to ICU where he was intubated. While in ICU he had a seizure, EEG which showed no epileptiform activity. CT chest showed bilateral effusions. Developed RUQ pain and US showed concern for acute acalculous cholecystitis. HIDA negative and started on abx.  Continues on 80 mg IV lasix BID along with a dose of metolazone. 24 hr I/O -400 cc.  INR trending up. Denies SOB, orthopnea, or CP. Cr trending up 2.28 (baseline Cr 0.8)    Objective:   Weight Range:  Vital Signs:   Temp:  [97.5 F (36.4 C)-98.3 F (36.8 C)] 97.5 F (36.4 C) (02/20 0639) Pulse Rate:  [66-81] 81 (02/20 0639) Resp:  [20] 20 (02/20 0639) BP: (114-163)/(65-89) 163/89 mmHg (02/20 0639) SpO2:  [90 %-99 %] 94 % (02/20 0639) Last BM Date: 01/09/14  Weight change: Filed Weights   01/11/14 0500 01/12/14 0423 01/13/14 0644  Weight: 151 lb 0.2 oz (68.5 kg) 150 lb 6.8 oz (68.233 kg) 136 lb 14.8 oz (62.109 kg)    Intake/Output:   Intake/Output Summary (Last 24 hours) at 01/14/14 0848 Last data filed at 01/13/14 2215  Gross per 24 hour  Intake   1660 ml  Output   2700 ml  Net  -1040 ml     Physical Exam: General: Frail appearing. Sitting in chair. No resp difficulty  HEENT: normal  Neck: supple. JVP to ear . Carotids 2+ bilat; no bruits. No lymphadenopathy or thryomegaly appreciated.  Cor: PMI nondisplaced. Distant. Regular rate & rhythm. No obvious rubs,  gallops or murmurs.  Lungs: clear  Abdomen: soft, nontender, + distended. No hepatosplenomegaly. No bruits or masses. Good bowel sounds.  Extremities: no cyanosis, clubbing, rash, 3+ woody bilateral edema INTO THIGH; L foot dressing intact  Neuro: alert & orientedx3, cranial nerves grossly intact. moves all 4 extremities w/o difficulty. Affect pleasant   Telemetry: SR 70-80s  Labs: Basic Metabolic Panel:  Recent Labs Lab 01/10/14 0440 01/11/14 0605 01/12/14 0551 01/13/14 0413 01/14/14 0415  NA 139 140 140 141 141  K 3.7 3.7 3.5* 3.6* 3.9  CL 105 104 105 104 103  CO2 22 22 23 24 25   GLUCOSE 154* 145* 123* 131* 134*  BUN 22 23 30* 30* 31*  CREATININE 1.53* 1.77* 2.05* 1.99* 2.28*  CALCIUM 8.1* 8.4 8.5 8.2* 8.4    Liver Function Tests:  Recent Labs Lab 01/07/14 1511 01/08/14 0400 01/09/14 0428 01/11/14 1115  AST 43* 158* 66* 36  ALT 32 128* 94* 66*  ALKPHOS 177* 223* 154* 236*  BILITOT 0.4 0.4 0.2* 0.4  PROT 5.9* 7.0 5.9* 7.3  ALBUMIN 1.3* 1.7* 1.3* 1.6*    Recent Labs Lab 01/08/14 0400  LIPASE 18  AMYLASE 118*   No results found for this basename: AMMONIA,  in the last 168 hours  CBC:  Recent Labs Lab 01/10/14 0440 01/11/14 0605 01/12/14 0551 01/12/14 1350 01/14/14 0415  WBC 20.7* 23.1* 17.6* 19.0* 14.8*  HGB 8.2* 8.0* 7.1* 8.0*  7.0*  HCT 25.7* 25.3* 22.2* 25.0* 21.5*  MCV 73.9* 74.2* 73.3* 73.1* 72.1*  PLT 446* 420* 385 424* 404*    Cardiac Enzymes:  Recent Labs Lab 01/07/14 1515 01/07/14 1830 01/08/14 0102 01/08/14 0620  TROPONINI <0.30 <0.30 <0.30 <0.30    BNP: BNP (last 3 results) No results found for this basename: PROBNP,  in the last 8760 hours  Imaging: Nm Hepatobiliary  01/12/2014   CLINICAL DATA:  Upper abdominal pain  EXAM: NUCLEAR MEDICINE HEPATOBILIARY IMAGING  Views:  Anterior right upper quadrant  Radionuclide:  Technetium 39m Choletec  Dose:  5.0 mCi  Route of administration: Intravenous  COMPARISON:  None.  FINDINGS:  Liver uptake of radiotracer is normal. There is prompt visualization of gallbladder and small bowel, indicating patency of the cystic and common bile ducts.  IMPRESSION: Study within normal limits.   Electronically Signed   By: Lowella Grip M.D.   On: 01/12/2014 13:14     Medications:     Scheduled Medications: . carvedilol  6.25 mg Oral BID WC  . ferrous sulfate  325 mg Oral TID WC  . furosemide  80 mg Intravenous BID  . hydrALAZINE  50 mg Oral 3 times per day  . insulin aspart  0-9 Units Subcutaneous TID WC  . isosorbide mononitrate  30 mg Oral Daily  . levETIRAcetam  250 mg Oral BID  . pantoprazole  20 mg Oral QAC breakfast  . piperacillin-tazobactam (ZOSYN)  IV  3.375 g Intravenous 3 times per day  . potassium chloride  20 mEq Oral BID  . verapamil  120 mg Oral Daily  . Warfarin - Pharmacist Dosing Inpatient   Does not apply q1800    Infusions: . sodium chloride 10 mL/hr at 01/13/14 1719  . heparin 1,750 Units/hr (01/14/14 0109)    PRN Medications: sodium chloride   Assessment:   1) Acute on chronic diastolic HF with massive volume overload  2) Acute renal failure  - baseline Cr 0.8  3) Diabetic foot ulcer, L  4) Acute DVT  5) HTN  6) + SPEP  7) Seizure  8) Acalculous cholecystitis/sepsis   Plan/Discussion:    Mr Joshua [name] is a pleasant 63 yo male with HTN, AKI and Acute diastolic HF. Started on IV lasix 80 mg BID yesterday with a dose of metolazone. Cr continues to trend up and not having much UOP.   SBP remains elevated will increase hydralazine to 75 mg TID. Continue to follow.    Length of Stay: 8 Rande Brunt NP-C 01/14/2014, 8:48 AM  Advanced Heart Failure Team Pager [phone] (M-F; 7a - 4p)  Please contact [facility] for night-coverage after hours (4p -7a ) and weekends on [email or web address]  Patient seen and examined with Junie Bame, NP. We discussed all aspects of the encounter. I agree with the assessment and plan as stated above.    Persists with massive volume overload with worsening renal function. Albumin now 1.3-1.6. 24-hour urine reviewed and has 3.6g protein over 24 hour period. SPEP and UPEP with no M-spike just polyclonal increase in protein.  His renal failure and degree of volume overload are much worse than I would suspect based on echo findings. I think main issue here may be nephrotic syndrome. Would consult renal. May need CVVHD and possible kidney biopsy at some point. Could consider placing central access and trial of renal dose dopamine. Doubt RHC would help much at this point but we are very willing to consider if Renal feels it  would help management. I d/w with Primary team.  Consider ABIs for LE wound. Will likely not heal wound until edema improved as well. Overall prognosis remains guarded.   We will follow as needed. Please call me with questions.   Alfreda Hammad,MD 9:30 AM

## 2014-01-14 NOTE — Progress Notes (Signed)
Physical Therapy Treatment Patient Details Name: TANIELA KOLESNIK MRN: [id number] DOB: [birth year] Today's Date: 01/14/2014 Time: [id number] PT Time Calculation (min): 22 min  PT Assessment / Plan / Recommendation  History of Present Illness 63 y.o. male admitted to [facility] on 01/06/14 with DM2, previous treatment (abbreviated) for extrapulmonary TB admitted with worseing foot ulcer with increasing pain, chills.  He had a foot ulcer plantar surface first metatarsal head left foot.  His hospital course was complicated by bradycardia, hypotension, seizure in the ICU.  The pt was intubated 01/07/14-01/08/14.  He was found to have bil peroneal DVTs, bil pleural effusions on chest CT (neg for PE), abdominal US revealed acute acalculous cholecystitis, and MRI of the foot showed no osteo, diffuse cellulitis, myositis, small 1st MTP joint effusion that is likely aseptic and incidental, osteonecrosis of first MTP joint.  Ortho preformed a bedside aspiration of the wound on 01/11/14.  Ortho recommends WBAT in post-op shoe.     PT Comments   Pt continues to be very limited in his gait distance due to generalized fatigue and today reports of weakness in his right arm on RW during gait.  Pt continues to be appropriate for inpatient rehab as he was independent to mod I before admission.  He continues to get tearful due to his lack of mobility and independence.    Follow Up Recommendations  CIR     Does the patient have the potential to tolerate intense rehabilitation    Yes  Barriers to Discharge   Lives alone, but cousin and his wife offering 24/7 help      Equipment Recommendations  Rolling walker with 5" wheels    Recommendations for Other Services   None  Frequency Min 3X/week   Progress towards PT Goals Progress towards PT goals: Progressing toward goals  Plan Current plan remains appropriate    Precautions / Restrictions Precautions Precautions: Fall Precaution Comments: pt is very deconditioned and  weak Required Braces or Orthoses: Other Brace/Splint Other Brace/Splint: post op shoe, left foot Restrictions Weight Bearing Restrictions: Yes LLE Weight Bearing: Weight bearing as tolerated Other Position/Activity Restrictions: Called [name] ortho PA who confirmed WBAT status and would like pt to walk in post-op shoe.     Pertinent Vitals/Pain HR in the 60s O2 sats 94-96% on RA throughout gait despite DOE 2-3/4 during gait.     Mobility  Bed Mobility Overal bed mobility: Needs Assistance Bed Mobility: Supine to Sit Supine to sit: Min assist General bed mobility comments: Min hand held assist with one hand and pulling on railing with other hand to get to sitting on EOB.   Transfers Overall transfer level: Needs assistance Equipment used: Rolling walker (2 wheeled) Transfers: Sit to/from Stand Sit to Stand: Min assist;From elevated surface General transfer comment: From elevated bed pt only min assist today to support trunk over weak legs.  Verbal cues for safe hand placement.  Ambulation/Gait Ambulation/Gait assistance: Mod assist Ambulation Distance (Feet): 20 Feet Assistive device: Rolling walker (2 wheeled) Gait Pattern/deviations: Step-to pattern;Antalgic;Staggering right Gait velocity: decreased General Gait Details: Pt leaning to the right during gait reporting that his right arm was tired/fatigued.  His left foot is elevated due to post op shoe and is intensifying his right lean.      Exercises General Exercises - Upper Extremity Shoulder Flexion: AROM;Both;10 reps;Seated Elbow Flexion: AROM;Both;10 reps;Seated General Exercises - Lower Extremity Ankle Circles/Pumps: AROM;Both;10 reps;Seated Long Arc Quad: AROM;Both;10 reps;Seated Hip ABduction/ADduction: AROM;Both;10 reps;Seated (adduction against pillow for resistance)  Hip Flexion/Marching: AROM;Both;10 reps;Seated Toe Raises: AROM;Both;10 reps;Seated Heel Raises: AROM;Both;10 reps;Seated     PT Goals (current  goals can now be found in the care plan section) Acute Rehab PT Goals Patient Stated Goal: eventually to go home and be independent again  Visit Information  Last PT Received On: 01/14/14 Assistance Needed: +1 History of Present Illness: 63 y.o. male admitted to [facility] on 01/06/14 with DM2, previous treatment (abbreviated) for extrapulmonary TB admitted with worseing foot ulcer with increasing pain, chills.  He had a foot ulcer plantar surface first metatarsal head left foot.  His hospital course was complicated by bradycardia, hypotension, seizure in the ICU.  The pt was intubated 01/07/14-01/08/14.  He was found to have bil peroneal DVTs, bil pleural effusions on chest CT (neg for PE), abdominal US revealed acute acalculous cholecystitis, and MRI of the foot showed no osteo, diffuse cellulitis, myositis, small 1st MTP joint effusion that is likely aseptic and incidental, osteonecrosis of first MTP joint.  Ortho preformed a bedside aspiration of the wound on 01/11/14.  Ortho recommends WBAT in post-op shoe.      Subjective Data  Subjective: Pt reports he is tired and his right arm is weak Patient Stated Goal: eventually to go home and be independent again   Cognition  Cognition Arousal/Alertness: Awake/alert Behavior During Therapy: WFL for tasks assessed/performed Overall Cognitive Status: Within Functional Limits for tasks assessed    Balance  Balance Overall balance assessment: Needs assistance Sitting-balance support: Bilateral upper extremity supported;Feet supported Sitting balance-Leahy Scale: Good Standing balance support: Bilateral upper extremity supported Standing balance-Leahy Scale: Poor  End of Session PT - End of Session Equipment Utilized During Treatment: Gait belt Activity Tolerance: Patient limited by fatigue Patient left: in chair;with call bell/phone within reach Nurse Communication: Mobility status;Other (comment) (leaving O2 [place] off due to good O2 sats)    Raygen Linquist B.  [place], [place], DPT [phone]   01/14/2014, 5:22 PM

## 2014-01-14 NOTE — Consult Note (Signed)
Reason for Consult:nephrotic syndrome, AKI Referring Physician: [place]  Vue [name] [name] is an 63 y.o. male.  HPI: Pt is [name] very pleasant 63yo [place] male who was admitted on 01/06/14 with diabetic foot infection and started empirically on Abx.  He has developed worsening lower ext edema and the heart failure team was consulted and ordered IV diuretics, his Scr has risen since admission.  The trend is seen below.  He was also recently diagnosed with acute DVT of both legs by duplex and is currently on coumadin.    Of note, he has had nephrotic range proteinuria dating back to at least 11/13 and his diabetes has been poorly controlled.  Since his admission, he had been on lisinopril and hctz which have been held since the second day of admission.  He has also received multiple antibiotics including zosyn, zyvox, augmentin, unasyn, keflex, and vancomycin as well as dopamine for SIRS at beginning of admission.  We were asked to help evaluate and manage his nephrotic syndrome and worsening AKI.  He does have [name] h/o lower ext edema dating back to at least May 2014 as evident by the ECHO ordered at that time, however the pt denies any swelling PTA.  No NSAIDs/COX-II I's/IV contrasted studies.  Trend in Creatinine:  Creatinine, Ser  Date/Time Value Ref Range Status  01/14/2014  4:15 AM 2.28* 0.50 - 1.35 mg/dL Final  01/13/2014  4:13 AM 1.99* 0.50 - 1.35 mg/dL Final  01/12/2014  5:51 AM 2.05* 0.50 - 1.35 mg/dL Final  01/11/2014  6:05 AM 1.77* 0.50 - 1.35 mg/dL Final  01/10/2014  4:40 AM 1.53* 0.50 - 1.35 mg/dL Final  01/09/2014  4:28 AM 1.52* 0.50 - 1.35 mg/dL Final  01/08/2014  4:00 AM 1.68* 0.50 - 1.35 mg/dL Final  01/07/2014  3:11 PM 1.81* 0.50 - 1.35 mg/dL   01/07/2014  6:06 AM 1.75* 0.50 - 1.35 mg/dL   01/05/2014  4:16 PM 1.14  0.50 - 1.35 mg/dL   12/04/2012  4:35 PM 0.80  0.50 - 1.35 mg/dL Final  06/29/2009  2:11 PM 0.53  0.4 - 1.5 mg/dL Final    PMH:   Past Medical History  Diagnosis Date  . TB  (pulmonary tuberculosis) 09/24/2012    deemed non-infectious  . Hypertension 10/14/2012  . Type 2 diabetes mellitus   . Diabetic nephropathy with proteinuria   . 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 Knee arthroscopy  2010    3 times, right knee    Allergies: No Known Allergies  Medications:   Prior to Admission medications   Medication Sig Start Date End Date Taking? Authorizing Provider  lisinopril (PRINIVIL,ZESTRIL) 40 MG tablet Take 1 tablet (40 mg total) by mouth daily. 11/26/13 11/26/14 Yes Duwaine Maxin, DO  metFORMIN (GLUCOPHAGE) 500 MG tablet Take 2 tablets (1,000 mg total) by mouth 2 (two) times daily with [name] meal. 11/26/13 11/26/14 Yes Alex Redmond Pulling, DO  verapamil (CALAN-SR) 180 MG CR tablet Take 1 tablet (180 mg total) by mouth daily. 01/05/14 01/05/15 Yes Duwaine Maxin, DO  glucose monitoring kit (FREESTYLE) monitoring kit 1 each by Does not apply route as needed for other. 12/04/12   Neta Ehlers, MD    Inpatient medications: . carvedilol  6.25 mg Oral BID WC  . ferrous sulfate  325 mg Oral TID WC  . furosemide  80 mg Intravenous BID  . hydrALAZINE  50 mg Oral 3 times per day  . insulin  aspart  0-9 Units Subcutaneous TID WC  . isosorbide mononitrate  30 mg Oral Daily  . levETIRAcetam  250 mg Oral BID  . pantoprazole  20 mg Oral QAC breakfast  . piperacillin-tazobactam (ZOSYN)  IV  3.375 g Intravenous 3 times per day  . potassium chloride  20 mEq Oral BID  . verapamil  120 mg Oral Daily  . Warfarin - Pharmacist Dosing Inpatient   Does not apply q1800    Discontinued Meds:   Medications Discontinued During This Encounter  Medication Reason  . verapamil (CALAN-SR) CR tablet 180 mg   . hydrochlorothiazide (MICROZIDE) capsule 12.5 mg   . glipiZIDE (GLUCOTROL) 5 MG tablet Patient has not taken in last 30 days  . gabapentin (NEURONTIN) 300 MG capsule Patient has not taken in last 30 days  . hydrochlorothiazide (MICROZIDE) 12.5  MG capsule Patient has not taken in last 30 days  . simethicone (MYLICON) 40 CV/8.9FY suspension 40 mg   . hydrALAZINE (APRESOLINE) injection 10 mg   . hydrochlorothiazide (MICROZIDE) capsule 25 mg   . lisinopril (PRINIVIL,ZESTRIL) tablet 40 mg   . nitroGLYCERIN (NITROSTAT) SL tablet 0.4 mg   . verapamil (CALAN-SR) CR tablet 240 mg   . vancomycin (VANCOCIN) IVPB 1000 mg/200 mL premix   . fentaNYL (SUBLIMAZE) 0.05 MG/ML injection Returned to ADS  . gabapentin (NEURONTIN) capsule 300 mg   . simethicone (MYLICON) chewable tablet 40 mg   . insulin aspart (novoLOG) injection 0-9 Units   . insulin aspart (novoLOG) injection 0-5 Units   . aspirin tablet 325 mg   . pantoprazole (PROTONIX) EC tablet 40 mg Change in therapy  . heparin injection 5,000 Units Change in therapy  . DOPamine (INTROPIN) 800 mg in dextrose 5 % 250 mL infusion   . heparin ADULT infusion 100 units/mL (25000 units/250 mL)   . aspirin tablet 325 mg   . fentaNYL (SUBLIMAZE) injection 25-100 mcg   . LORazepam (ATIVAN) injection 0.5-1 mg   . pantoprazole sodium (PROTONIX) 40 mg/20 mL oral suspension 40 mg   . chlorhexidine (PERIDEX) 0.12 % solution 15 mL   . antiseptic oral rinse (BIOTENE) solution 15 mL   . hydrochlorothiazide (MICROZIDE) 12.5 MG capsule   . insulin aspart (novoLOG) injection 0-9 Units   . linezolid (ZYVOX) IVPB 600 mg   . piperacillin-tazobactam (ZOSYN) IVPB 3.375 g   . verapamil (CALAN) tablet 80 mg   . amLODipine (NORVASC) tablet 10 mg   . amoxicillin-clavulanate (AUGMENTIN) 875-125 MG per tablet 1 tablet   . labetalol (NORMODYNE,TRANDATE) injection 5 mg   . Warfarin - Pharmacist Dosing Inpatient   . verapamil (CALAN) tablet 120 mg   . furosemide (LASIX) tablet 40 mg   . hydrALAZINE (APRESOLINE) tablet 10 mg Change in therapy  . Ampicillin-Sulbactam (UNASYN) 3 g in sodium chloride 0.9 % 100 mL IVPB   . hydrALAZINE (APRESOLINE) tablet 25 mg   . levETIRAcetam (KEPPRA) 500 mg in sodium chloride 0.9 %  100 mL IVPB Change in therapy  . heparin ADULT infusion 100 units/mL (25000 units/250 mL)     Social History:  reports that he quit smoking about 21 years ago. His smoking use included Cigarettes. He smoked 0.00 packs per day for 20 years. He has never used smokeless tobacco. He reports that he does not drink alcohol or use illicit drugs.  Family History:  No family history on file.  Pertinent items are noted in HPI. Weight change:   Intake/Output Summary (Last 24 hours) at 01/14/14 1412 Last  data filed at 01/14/14 0900  Gross per 24 hour  Intake   1320 ml  Output   2800 ml  Net  -1480 ml   BP 163/89  Pulse 81  Temp(Src) 97.5 F (36.4 C) (Oral)  Resp 20  Ht _0  (1.676 m)  Wt 71.351 kg (157 lb 4.8 oz)  BMI 25.40 kg/m2  SpO2 94% Filed Vitals:   01/13/14 1440 01/13/14 2149 01/14/14 0639 01/14/14 0900  BP:  144/66 163/89   Pulse:  77 81   Temp:  98.3 F (36.8 C) 97.5 F (36.4 C)   TempSrc:  Oral Oral   Resp:   20   Height:      Weight:    71.351 kg (157 lb 4.8 oz)  SpO2: 90% 99% 94%      General appearance: alert, cooperative and no distress Head: Normocephalic, without obvious abnormality, atraumatic Eyes: negative findings: lids and lashes normal, conjunctivae and sclerae normal and corneas clear Neck: no adenopathy, no carotid bruit, supple, symmetrical, trachea midline and thyroid not enlarged, symmetric, no tenderness/mass/nodules Resp: diminished breath sounds bibasilar Cardio: regular rate and rhythm, S1, S2 normal, no murmur, click, rub or gallop GI: soft, non-tender; bowel sounds normal; no masses,  no organomegaly Extremities: edema 2+ bilateral lower ext, left foot wrapped in gauze Neuro- grossly normal, no asterixis  Labs: Basic Metabolic Panel:  Recent Labs Lab 01/07/14 1511 01/08/14 0400 01/09/14 0428 01/10/14 0440 01/11/14 0605 01/11/14 1115 01/12/14 0551 01/13/14 0413 01/14/14 0415  NA 133* 138 141 139 140  --  140 141 141  K 4.4 3.8 3.6*  3.7 3.7  --  3.5* 3.6* 3.9  CL 100 104 107 105 104  --  105 104 103  CO2 18* _1 --  _2 GLUCOSE 191* 126* 80 154* 145*  --  123* 131* 134*  BUN 42* 37* 26* 22 23  --  30* 30* 31*  CREATININE 1.81* 1.68* 1.52* 1.53* 1.77*  --  2.05* 1.99* 2.28*  ALBUMIN 1.3* 1.7* 1.3*  --   --  1.6*  --   --   --   CALCIUM 7.4* 7.7* 7.7* 8.1* 8.4  --  8.5 8.2* 8.4   Liver Function Tests:  Recent Labs Lab 01/08/14 0400 01/09/14 0428 01/11/14 1115  AST 158* 66* 36  ALT 128* 94* 66*  ALKPHOS 223* 154* 236*  BILITOT 0.4 0.2* 0.4  PROT 7.0 5.9* 7.3  ALBUMIN 1.7* 1.3* 1.6*    Recent Labs Lab 01/08/14 0400  LIPASE 18  AMYLASE 118*   No results found for this basename: AMMONIA,  in the last 168 hours CBC:  Recent Labs Lab 01/12/14 0551 01/12/14 1350 01/14/14 0415 01/14/14 1325  WBC 17.6* 19.0* 14.8* 17.5*  HGB 7.1* 8.0* 7.0* 8.1*  HCT 22.2* 25.0* 21.5* 25.0*  MCV 73.3* 73.1* 72.1* 72.5*  PLT 385 424* 404* 498*   PT/INR: _3 (inr:5) Cardiac Enzymes: ) Recent Labs Lab 01/07/14 1515 01/07/14 1830 01/08/14 0102 01/08/14 0620  TROPONINI <0.30 <0.30 <0.30 <0.30   CBG:  Recent Labs Lab 01/13/14 1156 01/13/14 1645 01/13/14 2152 01/14/14 0800 01/14/14 1143  GLUCAP 124* 183* 153* 193* 208*    Iron Studies:  Recent Labs Lab 01/12/14 0551  IRON 12*  TIBC 181*  FERRITIN 179    Xrays/Other Studies: No results found.   Assessment/Plan: 1.  AKI- in setting of acute illness, infected left foot, multiple antibiotics, aggressive diuresis in setting of bilateral DVT's and  longstanding nephrotic-range proteinuria. 1. Would hold diuretics as this is due to DVT and nephrosis which is worsening renal perfusion/ischemic ATN 2. Serologies ordered and would recommend switching to IV heparin and plan for renal biopsy next week when safe to proceed 3. Renal dose meds 4. Order renal US and also r/o renal vein thrombosis with duplex given hypercoagulable  state 5. Follow renal function. 2. Proteinuria- SPEP/UPEP without monoclonal proteins, however 24 hour not quantified. 1. Collect 24 hour urine for protein quantification 3. Diabetic foot ulcer/infection- on abx, plan per primary svc 4. Bilateral DVT's- possibly related to nephrotic syndrome (loss of ATIII) but also need to r/o anti-phospholipid ab and other hypercoagulable states.  Also would recommend switching back to heparin gtt in order to allow for renal biopsy in order to help identify underlying renal disease.  Will need to d/w primary team and IR in hopes for next week sometime 5. HTN- stable 6. DM- per primary svc 7. Acalculus cholecystitis with Abnormal LFT's- follow 8. ABLA- low iron stores, guaiac stools and r/o AIHA 9.    Joshua [name] 01/14/2014, 2:12 PM

## 2014-01-14 NOTE — Progress Notes (Signed)
Subjective: Pt doing well this AM, ambulating without complication in post op shoe to left foot.  Pt presented in hospital bed eating breakfast, denies N/V/F/C, chest pain, SOB, or changes to appetite.  Objective: Vital signs in last 24 hours: Temp:  [97.5 F (36.4 C)-98.3 F (36.8 C)] 97.5 F (36.4 C) (02/20 0639) Pulse Rate:  [66-81] 81 (02/20 0639) Resp:  [20] 20 (02/20 0639) BP: (114-163)/(65-89) 163/89 mmHg (02/20 0639) SpO2:  [90 %-99 %] 94 % (02/20 0639)  Intake/Output from previous day: 02/19 0701 - 02/20 0700 In: 2260 [P.O.:1740; I.V.:370; IV Piggyback:150] Out: 2700 [Urine:2700] Intake/Output this shift:     Recent Labs  01/12/14 0551 01/12/14 1350 01/14/14 0415  HGB 7.1* 8.0* 7.0*    Recent Labs  01/12/14 1350 01/12/14 2308 01/14/14 0415  WBC 19.0*  --  14.8*  RBC 3.42* 2.98* 2.98*  HCT 25.0*  --  21.5*  PLT 424*  --  404*    Recent Labs  01/13/14 0413 01/14/14 0415  NA 141 141  K 3.6* 3.9  CL 104 103  CO2 24 25  BUN 30* 31*  CREATININE 1.99* 2.28*  GLUCOSE 131* 134*  CALCIUM 8.2* 8.4    Recent Labs  01/13/14 0413 01/14/14 0415  INR 2.08* 3.92*    63y/o male in NAD, A/Ox3, appears stated age.  EOMI, mood and affect normal, respirations unlabored on O2 via [place]. Dry dressing to left foot c/d/i, without saturation.  Dressing taken down wound insepcted.  Plantar ulcer persists with circumferential edema to mid foot extending distally to toes.  Moderate amount of d/c noted from plantar ulcer, iodoform gauze in place and saturated with purulent d/c.  No blood noted on exam from ulcer.  Decreased sensation to area surrounding plantar ulcer of the left foot but normal sensation to dorsal left foot.  DP 1+, venous stasis noted to b/l ankles.  Wound redressed.  Assessment/Plan: Continue antibiotics Continue dressing changes in the AM with iodoform gauze, 4x4s, and curlex Continue medical management as dictated by Dr. Shelton Silvas, Earlena Werst  S 01/14/2014, 7:59 AM

## 2014-01-14 NOTE — Progress Notes (Signed)
Inpatient Diabetes Program Recommendations  AACE/ADA: New Consensus Statement on Inpatient Glycemic Control (2013)  Target Ranges:  Prepandial:   less than 140 mg/dL      Peak postprandial:   less than 180 mg/dL (1-2 hours)      Critically ill patients:  140 - 180 mg/dL   Reason for Assessment: Hyperglycemia  Diabetes history: Type 2 Outpatient Diabetes medications: Metformin Current orders for Inpatient glycemic control: Novolog Sensitive correction tid Results for Joshua [name], Joshua [name] (MRN [id number]) as of 01/14/2014 14:58  Ref. Range 01/13/2014 07:56 01/13/2014 11:56 01/13/2014 16:45 01/13/2014 21:52 01/14/2014 08:00 01/14/2014 11:43  Glucose-Capillary Latest Range: 70-99 mg/dL 129 (H) 124 (H) 183 (H) 153 (H) 193 (H) 208 (H)   Note:  CBG's gradually increasing.  While in the hospital, please consider adding Lantus or Levemir 10 units either daily or HS if before breakfast CBG continues to be sub-optimal.  Thank you.  Egon Dittus S. Marcelline Mates, RN, CNS, CDE Inpatient Diabetes Program, team pager [phone]

## 2014-01-14 NOTE — Progress Notes (Signed)
VASCULAR LAB PRELIMINARY  PRELIMINARY  PRELIMINARY  PRELIMINARY  Left ABI completed.    Preliminary report:  Calcified arterial pressures in left pedal area with pressures > 200.  Triphasic pedal waveforms X 3 in foot with great toe pressure 176mmHg.    Brachial BP 164mmHg.  Purely from a vascular  Standpoint the great toe pressure suggest adequate digital flow to support healing.  Joshua [name], RVT 01/14/2014, 10:16 AM

## 2014-01-14 NOTE — Progress Notes (Signed)
ANTICOAGULATION CONSULT NOTE - Follow Up Consult  Pharmacy Consult for Heparin Indication: DVT  No Known Allergies  Patient Measurements: Height: 5\' 6"  (167.6 cm) Weight: 157 lb (71.215 kg) IBW/kg (Calculated) : 63.8 Heparin Dosing Weight: 71kg  Vital Signs: Temp: 97.5 F (36.4 C) (02/20 0639) Temp src: Oral (02/20 0639) BP: 163/89 mmHg (02/20 0639) Pulse Rate: 81 (02/20 0639)  Labs:  Recent Labs  01/12/14 0551 01/12/14 1350 01/13/14 0413 01/14/14 0415  HGB 7.1* 8.0*  --  7.0*  HCT 22.2* 25.0*  --  21.5*  PLT 385 424*  --  404*  LABPROT 17.5*  --  22.7* 36.9*  INR 1.48  --  2.08* 3.92*  HEPARINUNFRC 0.53  --  0.44 0.61  CREATININE 2.05*  --  1.99* 2.28*    Estimated Creatinine Clearance: 30.3 ml/min (by C-G formula based on Cr of 2.28).   Medications:  Heparin @ 1750 units/hr  Assessment: 62yom continues on heparin/coumadin overlap day #5 for LLE DVT (01/07/14). Heparin level is at goal, but trending up. With worsening renal function worry that he may be accumulating. INR has also jumped and is now supratherapeutic. Noted drop in Hgb 8-->7, platelets stable. No bleeding reported.  Goal of Therapy:  INR 2-3 Heparin level 0.3-0.7 units/ml Monitor platelets by anticoagulation protocol: Yes   Plan:  1) Decrease heparin to 1600 units/hr - continue one more day and then can discontinue tomorrow 2/21 2) Hold coumadin tonight 3) Follow up heparin level, INR, CBC in AM  Deboraha Sprang 01/14/2014,9:29 AM

## 2014-01-14 NOTE — Progress Notes (Signed)
Medical Student Daily Progress Note  Subjective: No events overnight. Patient has a few drops of nose bleed when blowing nose. He is on oxygen via nasal canula.   No worsening of SOB.  Objective: Vital signs in last 24 hours: Filed Vitals:   01/13/14 1440 01/13/14 2149 01/14/14 0639 01/14/14 0900  BP:  144/66 163/89   Pulse:  77 81   Temp:  98.3 F (36.8 C) 97.5 F (36.4 C)   TempSrc:  Oral Oral   Resp:   20   Height:      Weight:    71.351 kg (157 lb 4.8 oz)  SpO2: 90% 99% 94%    Weight change:   Intake/Output Summary (Last 24 hours) at 01/14/14 1425 Last data filed at 01/14/14 0900  Gross per 24 hour  Intake   1320 ml  Output   2800 ml  Net  -1480 ml   Physical Exam: BP 163/89  Pulse 81  Temp(Src) 97.5 F (36.4 C) (Oral)  Resp 20  Ht '5\' 6"'  (1.676 m)  Wt 71.351 kg (157 lb 4.8 oz)  BMI 25.40 kg/m2  SpO2 94% General appearance: alert, cooperative and no distress Neck: JVD - 6 cm above sternal notch Lungs: Diminished RLL sounds, no egophony, no tactile fermitus, tympanic to percussion Heart: Faint to none S4 or S1 split, regular rate, no murmur Abdomen: soft, non-tender; bowel sounds normal; no masses,  no organomegaly Extremities: 2+ pedal edema, Ulcer on L plantar foot is non draining and packed to prevent abscess formation Pulses: 2+ and symmetric Lab Results: BMET    Component Value Date/Time   NA 141 01/14/2014 0415   K 3.9 01/14/2014 0415   CL 103 01/14/2014 0415   CO2 25 01/14/2014 0415   GLUCOSE 134* 01/14/2014 0415   BUN 31* 01/14/2014 0415   CREATININE 2.28* 01/14/2014 0415   CREATININE 1.14 01/05/2014 1616   CALCIUM 8.4 01/14/2014 0415   GFRNONAA 29* 01/14/2014 0415   GFRAA 34* 01/14/2014 0415    CBC    Component Value Date/Time   WBC 17.5* 01/14/2014 1325   RBC 3.45* 01/14/2014 1325   RBC 2.98* 01/12/2014 2308   HGB 8.1* 01/14/2014 1325   HCT 25.0* 01/14/2014 1325   PLT 498* 01/14/2014 1325   MCV 72.5* 01/14/2014 1325   MCH 23.5* 01/14/2014 1325   MCHC 32.4 01/14/2014 1325   RDW 18.0* 01/14/2014 1325   LYMPHSABS 1.5 01/05/2014 1616   MONOABS 1.5* 01/05/2014 1616   EOSABS 0.1 01/05/2014 1616   BASOSABS 0.0 01/05/2014 1616     Micro Results: Recent Results (from the past 240 hour(s))  CULTURE, BLOOD (ROUTINE X 2)     Status: None   Collection Time    01/07/14  8:15 AM      Result Value Ref Range Status   Specimen Description BLOOD LEFT ARM   Final   Special Requests BOTTLES DRAWN AEROBIC AND ANAEROBIC 10CC   Final   Culture  Setup Time     Final   Value: 01/07/2014 14:02     Performed at [facility]   Culture     Final   Value: STAPHYLOCOCCUS SPECIES (COAGULASE NEGATIVE)     Note: THE SIGNIFICANCE OF ISOLATING THIS ORGANISM FROM A SINGLE SET OF BLOOD CULTURES WHEN MULTIPLE SETS ARE DRAWN IS UNCERTAIN. PLEASE NOTIFY THE MICROBIOLOGY DEPARTMENT WITHIN ONE WEEK IF SPECIATION AND SENSITIVITIES ARE REQUIRED.     Note: Gram Stain Report Called to,Read Back By and Verified With: [facility]  [facility] 01/08/14 @ 5:56PM BY RUSCOE A.     Performed at [facility]   Report Status 01/09/2014 FINAL   Final  CULTURE, BLOOD (ROUTINE X 2)     Status: None   Collection Time    01/07/14  8:30 AM      Result Value Ref Range Status   Specimen Description BLOOD LEFT ARM   Final   Special Requests BOTTLES DRAWN AEROBIC AND ANAEROBIC 10CC   Final   Culture  Setup Time     Final   Value: 01/07/2014 14:02     Performed at [facility]   Culture     Final   Value: NO GROWTH 5 DAYS     Performed at [facility]   Report Status 01/13/2014 FINAL   Final  ANAEROBIC CULTURE     Status: None   Collection Time    01/11/14  8:31 AM      Result Value Ref Range Status   Specimen Description WOUND FOOT LEFT   Final   Special Requests     Final   Value: LEFT PLANTAR ULCER PT ON ZOSYN AND ZYVOX AND AUGMENTIN   Gram Stain     Final   Value: NO WBC SEEN     NO SQUAMOUS EPITHELIAL CELLS SEEN     NO ORGANISMS SEEN     Performed at  [facility]   Culture     Final   Value: NO ANAEROBES ISOLATED; CULTURE IN PROGRESS FOR 5 DAYS     Performed at [facility]   Report Status PENDING   Incomplete  WOUND CULTURE     Status: None   Collection Time    01/11/14  8:31 AM      Result Value Ref Range Status   Specimen Description WOUND FOOT LEFT   Final   Special Requests LEFT PLANTAR ULCER PT ON ZYVOX,ZOSYN AND AUGMENTIN   Final   Gram Stain     Final   Value: NO WBC SEEN     NO SQUAMOUS EPITHELIAL CELLS SEEN     NO ORGANISMS SEEN     Performed at [facility]   Culture     Final   Value: FEW STAPHYLOCOCCUS AUREUS     Note: RIFAMPIN AND GENTAMICIN SHOULD NOT BE USED AS SINGLE DRUGS FOR TREATMENT OF STAPH INFECTIONS. This organism DOES NOT demonstrate inducible Clindamycin resistance in vitro.     Performed at [facility]   Report Status 01/14/2014 FINAL   Final   Organism ID, Bacteria STAPHYLOCOCCUS AUREUS   Final   Studies/Results: Left ABI: Calcified arterial pressures in left pedal area with pressures > 200. Triphasic pedal waveforms X 3 in foot with great toe pressure 171mHg. Brachial BP 1623mg. Purely from a vascular Standpoint the great toe pressure suggest adequate digital flow to support healing.   Medications: I have reviewed the patient's current medications. Scheduled Meds: . carvedilol  6.25 mg Oral BID WC  . cephALEXin  500 mg Oral 3 times per day  . ferrous sulfate  325 mg Oral TID WC  . furosemide  80 mg Intravenous BID  . hydrALAZINE  50 mg Oral 3 times per day  . insulin aspart  0-9 Units Subcutaneous TID WC  . isosorbide mononitrate  30 mg Oral Daily  . levETIRAcetam  250 mg Oral BID  . pantoprazole  20 mg Oral QAC breakfast  . potassium chloride  20 mEq Oral BID  .  verapamil  120 mg Oral Daily  . Warfarin - Pharmacist Dosing Inpatient   Does not apply q1800   Continuous Infusions: . sodium chloride 10 mL/hr at 01/13/14 1719  . heparin 1,600 Units/hr  (01/14/14 1140)   PRN Meds:.sodium chloride Assessment/Plan: Principal Problem:   Acute acalculous cholecystitis Active Problems:   Hypertension   Diabetes mellitus with renal manifestation   Hypoalbuminemia   Diabetic nephropathy with proteinuria   Cellulitis   Acute respiratory failure   New onset seizure   Acute renal failure   Elevated alkaline phosphatase level   Microcytic anemia   DVT of popliteal vein   Acute on chronic diastolic heart failure   Possible GI bleeding   Nephrotic syndrome   Anasarca   LOS: 8 days   ANTIBIOTICS: total days  - 9 Vanc 2/12 >>> 2/13  Linezolid 2/13 >>> 2/16  Pip-tazo 2/13 >>> 2/16  Augmentin 2/16 >>> 2/17  Unasyn 2/17 >>> 2/19  Zosyn 2/19 >>> 2/20 Cephalexin 2/20 >>>   Joshua [name] is a 63yo man with history of T2DM, HTN, L foot ulcer, previously treated extrapulmonary TB who has returned to IMTS from ICU with sepsis possibly due to acalculous cholecystitis, but patient is afebrile and most active issues include dCHF, AKI on CKD with nephrotic syndrome, and possible GIB.   # dCHF: Daily weight: 157lb (admission wt 151lb); I/O (-0.5L today, +5.8 from admission); Cr 2.28 (baseline 0.8); patient presents with bilateral pleural effusion on CT, peripheral edema, JVD, SOB, ARF, EF of 55, and grade 2 diastolic dysfunction on ECHO. SOB is improving with furosemide, and but Cr is worsening to 2.28 today. Urine output is poor today. - Appreciate heart failure team's recs: severe CHF suggest renal involvement - Verapamil to 169m daily  - Coreg 6.25 mg bid - no change as patient is in acute CHF exacerbation  - Furosemide 85mIV BID  - One dose of metolazone  - hold off ACEI with AKI  - Follow up on Renal Labs  - Will aim for negative or even fluid balance. He is still positive since admission   # AKI on CKD with nephrotic syndrome in the setting of dCHF: Cr stabilizing at 1.99 from 2.05 yesterday (up from 0.8 on admission). FE Urea of 39%, FENa  of 1.7%, BUN/Cr ratio < 20, and proteinuria (UPC ~4) indicating an intra-renal cause of AKI, but the sudden increase in Cr from normal Cr during hospitalization indicates a pre-renal cause especially given patient's dCHF and furosemide use. Liver cause of low albumin is ruled out given normal ultrasound scan and relatively normal LFTs for hepatic cause. Intra renal causes include diabetic nephropathy (urine albumin/cr 8434.2) with nephrotic syndrome. SPEP and UPEP returned without M spike. However, current AKI is most likely caused by intra-renal causes as aggressive diuresis has not improved kidney functions.  - Nephrology recs appreciated - Hold off ACEI until AKI resolves   # HTN: patient had several episode of BP in 180/100's. Goal pressure for proteinuric CKD is <130/80. With heart failure team's reocmmendation, we will increase hydralazine to 5056mID.  - Furosemide 74m66m BID  - Verapamil 120mg34m - Carvedilol 6.25mg 11m - Increase Hydralazine 50mg T27mo 75mg TI45mr HF recs  # Septic shock likely due to acalculous cholecystitis: WBC remains elevated at 17.5 while on antibiotics, but is afebrile and does not clinically appear toxic. Acalculous cholecystitis was found on RUQ ultrasound with gallbladder wall thickening of 1.1cm with chronically elevated alk  phos and GGT ( in 2013, in 2015) indicating liver as source of elevation in alk phos. AST and ALT are now normal after a brief elevation during his ICU stay AST (158) and ALT (128). TBili and DBili remained normal. 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 Other causes for sepsis that are ruled out are systemic bacteremia (only 1 out of 2 cultures with coagulase negative staph) and osteomyelitis (MRI does not show osteomyelitis).   Plan  - Surgery signed off: not a surgical candidate, and HIDA does not show hepatobiliary abnormalities. No antibiotics as they believe that it is a biliary disease, likely biliary dyskinesia or  chronic cholecystitis. Recommend outpatient follow up for cholecystectomy once patient is medically stable.  - Stop IV Zosyn  - PPI 57m PO qD   # Diabetic foot ulcer: MRI of L foot did not show osteomyelitis, but did show local cellulitis and possible myositis. Wound debrided and packed to prevent abscess. Wound culture showed some Staph species and no anaerobes. ABI on left shows left pedal area with pressure >200, adequate for digital flow to support healing.  -Cephlaxin should cover staph species in wound -Wound packing to prevent abscess  -Ortho recommend I&D or amputation, likely happen on 2/22 afternoon  # Iron-deficiency Anemia: hemoglobin of 8.2 in 2015, but 13.9 in 2010. Hgb is stable at 8-9. Iron panel shows low iron stores (Iron 19, Ferritin 179), and inappropriately low retic index (0.2). FOBT is positive x2. No colonoscopy on file, but there was 3 negative FOBT in 10/2012.  -GI recommends air-constrat barium enema to look for significant lesions in colon, and an upper GI series for peptic ulcers. These studies will not interfere with patient's anticoagulation regimen. GI want to defer GI evlauation until patient's cardiopulmonary status has improved, possibility during outpatient follow up.  -Protonix 210mPO qD: upper GI bleed and RUQ pain after eating  -Ferrous sulfate 32546mablet TID   # Acute DVT: Patient was found to have bilateral acute DVT without PE. Therapeutic on heparin with UFH of 0.44 and goal of 0.3-0.7. Supra therapeutic on Warfarin with INR of 3.92. Anticoagulation team following. If patient undergoes surgery, anticoagulation team will be notified to manage anticoagulation  -Anticoagulation team recs appreciated  - on Hep gtt  - Hold coumadin per pharmacy to achieve goal INR of 2-3 - will likely require anticoagulants for three months for first time symptomatic acute DVT   # Seizure: EEG showed medication effect. This is patient's first seizure in the setting of  acute respiratory failure. This is most likely an isolated event. Patient does not have a history of substance abuse, and labs shows that electrolytes are in normal ranges.  -Taper Keppra by 1/2 for on 2/21, and stop on 2/22 per pharmacy recs  # Hypoalbuminemia:  Patient has albumin of 1.7 with protein gap of 5.3 from total protein. Patient has a baseline hypoalbuminemia. However, UPEP showed elevated kappa and lambda free light chains, but the kappa/lambda ratio is normal (4). We are less concerned for paraprotein phenomenon even though patient has anemia, AKI, and elevated alkaline phosphate. Most likely cause is nephrotic syndrome. Patient does not appear to have liver disease given normal liver appearance on ultrasound and normal AST/ALT, and the acalculous cholecystitis can explain the elevated alk phos level.  -SPEP follow-up   Proph:  DVT: already on UFH  Mobility: PT and OT request rehab consult for patient. Patient requires walker   This is a [employer] information officerte.  The  care of the patient was discussed with Dr. Linus Salmons and the assessment and plan formulated with their assistance.  Please see their attached note for official documentation of the daily encounter.  Konrad Saha 01/14/2014, 2:25 PM   I have seen the patient and reviewed the daily progress note by Elvin So MS IV and discussed the care of the patient with them.    Signed:  Jessee Avers, MD PGY-2 Internal Medicine Teaching Service Pager: [phone]

## 2014-01-14 NOTE — Progress Notes (Addendum)
  I have seen and examined the patient, and reviewed the daily progress note by Konrad Saha, MS IV and discussed the care of the patient with them. Please see my progress note from 01/13/2014 for further details regarding assessment and plan.    Signed:  Othella Boyer, MD 01/14/2014, 9:16 AM

## 2014-01-14 NOTE — Evaluation (Signed)
Occupational Therapy Evaluation Patient Details Name: Joshua [name] MRN: [id number] DOB: [birth year] Today's Date: 01/14/2014 Time: 1325-1405 OT Time Calculation (min): 40 min  OT Assessment / Plan / Recommendation History of present illness 63 y.o. male admitted to [facility] on 01/06/14 with DM2, previous treatment (abbreviated) for extrapulmonary TB admitted with worseing foot ulcer with increasing pain, chills.  He had a foot ulcer plantar surface first metatarsal head left foot.  His hospital course was complicated by bradycardia, hypotension, seizure in the ICU.  The pt was intubated 01/07/14-01/08/14.  He was found to have bil peroneal DVTs, bil pleural effusions on chest CT (neg for PE), abdominal US revealed acute acalculous cholecystitis, and MRI of the foot showed no osteo, diffuse cellulitis, myositis, small 1st MTP joint effusion that is likely aseptic and incidental, osteonecrosis of first MTP joint.  Ortho preformed a bedside aspiration of the wound on 01/11/14.  Ortho recommends WBAT in post-op shoe.     Clinical Impression   Pt admitted with. Pt currently with functional limitations due to the deficits listed below (see OT Problem List). Pt will benefit from skilled OT to increase their safety and independence with ADL and functional mobility for ADL to facilitate discharge to CIR for intense inpatient rehab.       OT Assessment  Patient needs continued OT Services    Follow Up Recommendations  CIR (SNF if does not qualify for CIR level of care)    Barriers to Discharge   Family available to assist once patient makes some improvements.   Equipment Recommendations   (If does not go to rehab, will need BSC and tub DME (bench vs. seat))    Recommendations for Other Services Rehab consult  Frequency  Min 2X/week    Precautions / Restrictions Precautions Precautions: Fall Precaution Comments: pt is very deconditioned and weak Restrictions Weight Bearing Restrictions: Yes LLE Weight  Bearing: Weight bearing as tolerated Other Position/Activity Restrictions: Called [name] ortho PA who confirmed WBAT status and would like pt to walk in post-op shoe.     Pertinent Vitals/Pain Denies pain    ADL  Grooming: Simulated;Min guard Where Assessed - Grooming: Supported standing Upper Body Bathing: Simulated;Set up Where Assessed - Upper Body Bathing: Supported sitting Lower Body Bathing: Simulated;Moderate assistance Where Assessed - Lower Body Bathing: Supported sitting;Supported standing Lower Body Dressing: Simulated;Performed;Maximal assistance Toilet Transfer: Simulated;+2 Total assistance (second person to stabilize walker for sit>stand) Toilet Transfer: Patient Percentage: 40% Toilet Transfer Method: Sit to stand Transfers/Ambulation Related to ADLs: sit><stand from recliner 2 times, declined need to toilet.  Second person needed for sit>stand to stabilize walker.  Post op shoe on left foot also sliding during sit><stand. ADL Comments: Patient able to doff and donn right sock however unable to donn or doff post op shoe.    OT Diagnosis: Generalized weakness  OT Problem List: Decreased strength;Decreased activity tolerance;Impaired balance (sitting and/or standing);Cardiopulmonary status limiting activity;Increased edema OT Treatment Interventions: Self-care/ADL training;Energy conservation;DME and/or AE instruction;Therapeutic activities;Patient/family education;Balance training   OT Goals(Current goals can be found in the care plan section) Acute Rehab OT Goals Patient Stated Goal: eventually to go home and be independent again OT Goal Formulation: With patient/family Time For Goal Achievement: 01/21/14 Potential to Achieve Goals: Good  Visit Information  Last OT Received On: 01/14/14 Assistance Needed: +1 History of Present Illness: 63 y.o. male admitted to [facility] on 01/06/14 with DM2, previous treatment (abbreviated) for extrapulmonary TB admitted with worseing  foot ulcer with increasing pain, chills.  He had a foot ulcer plantar surface first metatarsal head left foot.  His hospital course was complicated by bradycardia, hypotension, seizure in the ICU.  The pt was intubated 01/07/14-01/08/14.  He was found to have bil peroneal DVTs, bil pleural effusions on chest CT (neg for PE), abdominal US revealed acute acalculous cholecystitis, and MRI of the foot showed no osteo, diffuse cellulitis, myositis, small 1st MTP joint effusion that is likely aseptic and incidental, osteonecrosis of first MTP joint.  Ortho preformed a bedside aspiration of the wound on 01/11/14.  Ortho recommends WBAT in post-op shoe.         Prior [place] expects to be discharged to:: Inpatient rehab Living Arrangements: Alone Available Help at Discharge: Family (cousin and cousin's wife) Type of Home: House Additional Comments: PTA patient lived alone and was Mod I with a cane.  Cousin states that patient can discharge to his home if he still needs assistance after a rehab stay.  Cousin's home and patient's home has a tub shower combo with a curtain Prior Function Level of Independence: Independent with assistive device(s) Comments: occationally would use a cane    Cognition  Cognition Arousal/Alertness: Awake/alert Behavior During Therapy: WFL for tasks assessed/performed Overall Cognitive Status: Within Functional Limits for tasks assessed    Extremity/Trunk Assessment Upper Extremity Assessment Upper Extremity Assessment: Overall WFL for tasks assessed Lower Extremity Assessment Lower Extremity Assessment: Defer to PT evaluation Cervical / Trunk Assessment Cervical / Trunk Assessment: Normal     Mobility Bed Mobility General bed mobility comments: Patient received by OT seated in recliner Transfers Overall transfer level: Needs assistance Equipment used: Rolling walker (2 wheeled) Transfers: Sit to/from Stand Sit to Stand: +2  safety/equipment;Max assist     End of Session OT - End of Session Equipment Utilized During Treatment: Gait belt;Rolling walker;Oxygen Activity Tolerance: Patient limited by fatigue Patient left: in chair;with call bell/phone within reach;with family/visitor present Nurse Communication:  (RN notified that condomn catheter came off during therapy)  [place], [place] 01/14/2014, 2:24 PM

## 2014-01-14 NOTE — Progress Notes (Signed)
24 hour urine started, catheter bag placed on ice

## 2014-01-15 LAB — CBC
HCT: 20.9 % — ABNORMAL LOW (ref 39.0–52.0)
HEMATOCRIT: 21.1 % — AB (ref 39.0–52.0)
Hemoglobin: 6.7 g/dL — CL (ref 13.0–17.0)
Hemoglobin: 7.1 g/dL — ABNORMAL LOW (ref 13.0–17.0)
MCH: 23 pg — ABNORMAL LOW (ref 26.0–34.0)
MCH: 24.1 pg — AB (ref 26.0–34.0)
MCHC: 32.1 g/dL (ref 30.0–36.0)
MCHC: 33.6 g/dL (ref 30.0–36.0)
MCV: 71.8 fL — ABNORMAL LOW (ref 78.0–100.0)
MCV: 71.8 fL — AB (ref 78.0–100.0)
PLATELETS: 418 10*3/uL — AB (ref 150–400)
Platelets: 450 10*3/uL — ABNORMAL HIGH (ref 150–400)
RBC: 2.91 MIL/uL — ABNORMAL LOW (ref 4.22–5.81)
RBC: 2.94 MIL/uL — AB (ref 4.22–5.81)
RDW: 17.8 % — AB (ref 11.5–15.5)
RDW: 17.8 % — ABNORMAL HIGH (ref 11.5–15.5)
WBC: 15.3 10*3/uL — ABNORMAL HIGH (ref 4.0–10.5)
WBC: 15.1 10*3/uL — AB (ref 4.0–10.5)

## 2014-01-15 LAB — RENAL FUNCTION PANEL
Albumin: 1.5 g/dL — ABNORMAL LOW (ref 3.5–5.2)
BUN: 39 mg/dL — AB (ref 6–23)
CO2: 26 meq/L (ref 19–32)
CREATININE: 2.53 mg/dL — AB (ref 0.50–1.35)
Calcium: 8.4 mg/dL (ref 8.4–10.5)
Chloride: 101 mEq/L (ref 96–112)
GFR calc Af Amer: 30 mL/min — ABNORMAL LOW (ref >90)
GFR calc non Af Amer: 26 mL/min — ABNORMAL LOW (ref >90)
Glucose, Bld: 156 mg/dL — ABNORMAL HIGH (ref 70–99)
Phosphorus: 4.7 mg/dL — ABNORMAL HIGH (ref 2.3–4.6)
Potassium: 4.4 mEq/L (ref 3.7–5.3)
Sodium: 140 mEq/L (ref 137–147)

## 2014-01-15 LAB — C4 COMPLEMENT: Complement C4, Body Fluid: 36 mg/dL (ref 10–40)

## 2014-01-15 LAB — GLUCOSE, CAPILLARY
Glucose-Capillary: 192 mg/dL — ABNORMAL HIGH (ref 70–99)
Glucose-Capillary: 121 mg/dL — ABNORMAL HIGH (ref 70–99)
Glucose-Capillary: 146 mg/dL — ABNORMAL HIGH (ref 70–99)
Glucose-Capillary: 185 mg/dL — ABNORMAL HIGH (ref 70–99)
Glucose-Capillary: 167 mg/dL — ABNORMAL HIGH (ref 70–99)

## 2014-01-15 LAB — HEPATITIS PANEL, ACUTE
HCV Ab: REACTIVE — AB
HEP B C IGM: NONREACTIVE
HEP B S AG: NEGATIVE
Hep A IgM: NONREACTIVE

## 2014-01-15 LAB — HEPARIN LEVEL (UNFRACTIONATED): Heparin Unfractionated: 0.71 IU/mL — ABNORMAL HIGH (ref 0.30–0.70)

## 2014-01-15 LAB — ANTISTREPTOLYSIN O TITER: ASO: 39 IU/mL (ref <409)

## 2014-01-15 LAB — C3 COMPLEMENT: C3 Complement: 125 mg/dL (ref 90–180)

## 2014-01-15 LAB — HOMOCYSTEINE: Homocysteine: 7.3 umol/L (ref 4.0–15.4)

## 2014-01-15 LAB — PROTIME-INR
INR: 2.72 — ABNORMAL HIGH (ref 0.00–1.49)
PROTHROMBIN TIME: 27.9 s — AB (ref 11.6–15.2)

## 2014-01-15 MED ORDER — WARFARIN SODIUM 5 MG PO TABS
5.0000 mg | ORAL_TABLET | Freq: Once | ORAL | Status: AC
  Administered 2014-01-15: 5 mg via ORAL
  Filled 2014-01-15: qty 1

## 2014-01-15 NOTE — Progress Notes (Signed)
Pharmacy Note-Anticoagulation  Pharmacy Consult :  63 y.o. male is currently on Coumadin with Heparin bridging for DVT.   Latest Labs : Hematology :  Recent Labs  01/12/14 1350 01/13/14 0413 01/14/14 0415 01/14/14 1325 01/15/14 0430  HGB 8.0*  --  7.0* 8.1* 6.7*  HCT 25.0*  --  21.5* 25.0* 20.9*  PLT 424*  --  404* 498* 450*  LABPROT  --  22.7* 36.9*  --  27.9*  INR  --  2.08* 3.92*  --  2.72*  HEPARINUNFRC  --  0.44 0.61  --  0.71*  CREATININE  --  1.99* 2.28*  --  2.53*    Lab Results  Component Value Date   INR 2.72* 01/15/2014   INR 3.92* 01/14/2014   INR 2.08* 01/13/2014        HEPARINUNFRC 0.71* 01/15/2014   HEPARINUNFRC 0.61 01/14/2014   HEPARINUNFRC 0.44 01/13/2014        HGB 6.7* 01/15/2014   HGB 8.1* 01/14/2014   HGB 7.0* 01/14/2014    Current Medication[s] Include: Scheduled:  Scheduled:  . carvedilol  6.25 mg Oral BID WC  . cephALEXin  500 mg Oral 3 times per day  . ferrous sulfate  325 mg Oral TID WC  . furosemide  80 mg Intravenous BID  . hydrALAZINE  50 mg Oral 3 times per day  . insulin aspart  0-9 Units Subcutaneous TID WC  . isosorbide mononitrate  30 mg Oral Daily  . pantoprazole  20 mg Oral QAC breakfast  . potassium chloride  20 mEq Oral BID  . verapamil  120 mg Oral Daily  . Warfarin - Pharmacist Dosing Inpatient   Does not apply q1800   Infusion[s]: Infusions:  . sodium chloride 10 mL/hr at 01/14/14 1900  . heparin 1,600 Units/hr (01/14/14 1900)   Antibiotic[s]: Anti-infectives   Start     Dose/Rate Route Frequency Ordered Stop   01/14/14 1430  cephALEXin (KEFLEX) capsule 500 mg  Status:  Discontinued     500 mg Oral 4 times per day 01/14/14 1420 01/14/14 1422   01/14/14 1430  cephALEXin (KEFLEX) capsule 500 mg     500 mg Oral 3 times per day 01/14/14 1422        Assessment :  Day # 6 overlap of Coumadin and Heparin.    Today's INR is down after holding dose yesterday.   INR is 2.72.    Heparin level is 0.71 units/ml.  Hgb down  today 8.1 > 6.7.  No overt bleeding observed, however, new bruise noted on stomach area.  Antibiotics change to PO Cephalexin.  Goal :  INR goal is 2-3    Heparin overlap 5 days + minimum of 1 more day with INR > 2.  Plan : 1. Heparin will be discontinued .   [Done] 2. Coumadin 5 mg today. 3. As per Medical Team, repeat CBC this AM. 4. Daily INR's, CBC. Monitor for bleeding complications  Estelle June, Pharm.D. 01/15/2014  8:01 AM

## 2014-01-15 NOTE — Clinical Social Work Psychosocial (Signed)
Clinical Social Work Department BRIEF PSYCHOSOCIAL ASSESSMENT 01/15/2014  Patient:  Joshua [name], Joshua [name]     Account Number:  [id number]     Admit date:  01/06/2014  Clinical Social Worker:  Wylene Men  Date/Time:  01/15/2014 02:57 PM  Referred by:  Physician  Date Referred:  01/15/2014 Referred for  SNF Placement   Other Referral:   none   Interview type:  Patient Other interview type:   none    PSYCHOSOCIAL DATA Living Status:  FAMILY Admitted from facility:   Level of care:   Primary support name:  [place] Primary support relationship to patient:  FRIEND Degree of support available:     adequate    CURRENT CONCERNS Current Concerns  Post-Acute Placement   Other Concerns:   none    SOCIAL WORK ASSESSMENT / PLAN CSW assessed pt at bedside.  Pt was alert and oriented x4 sitting in bedside chair watching TV.  PT recommends pt having STR/SNF upon dc.  Pt will be LOG due to no insurance (Medicaid Potential).  Pt states that he thought he had insurance.  CSW inquired as to what the name of the insurance was and pt was unaware.  Pt made a phone call with CSW present, but the party he was trying to reach was unavailable.  Financial Counselor referral will need to be made to deterimine other insurance or possible Medicaid application prior to placement.    Pt is agreeable to STR/SNF. CSW educated pt on LOG process and reported to pt that the placment may not be local.  Pt was agreeable and acknowledged understanding this was demonstrated through teach back.    Pt hopeful regarding his prognosis and is looking forward to returning home.  Pt realistic regarding his STR and is insightful regarding the necessity for such treatment.   Assessment/plan status:  Psychosocial Support/Ongoing Assessment of Needs Other assessment/ plan:   LOG   Information/referral to community resources:   LOG  SNF    PATIENT'S/FAMILY'S RESPONSE TO PLAN OF CARE: Pt was agreeable to the plan  of care, SNF/STR and LOG placement.       Nonnie Done, [place] [phone]  Clinical Social Work

## 2014-01-15 NOTE — Progress Notes (Signed)
Subjective: No overnight events. He is still short of breath.  Objective: Vital signs in last 24 hours: Filed Vitals:   01/15/14 0450 01/15/14 0700 01/15/14 1005 01/15/14 1259  BP: 151/84  143/68 131/55  Pulse: 87  94   Temp: 98.3 F (36.8 C)     TempSrc: Oral     Resp: 18     Height:      Weight:  157 lb 8 oz (71.442 kg)    SpO2: 92%      Weight change:   Intake/Output Summary (Last 24 hours) at 01/15/14 1404 Last data filed at 01/15/14 1241  Gross per 24 hour  Intake    660 ml  Output   1251 ml  Net   -591 ml   Physical Exam: BP 131/55  Pulse 94  Temp(Src) 98.3 F (36.8 C) (Oral)  Resp 18  Ht '5\' 6"'  (1.676 m)  Wt 157 lb 8 oz (71.442 kg)  BMI 25.43 kg/m2  SpO2 92% General appearance: alert, cooperative and no distress Neck: JVD - 6 cm above sternal notch Lungs: Diminished RLL sounds, no egophony, no tactile fermitus, tympanic to percussion Heart: Faint to none S4 or S1 split, regular rate, no murmur Abdomen: soft, non-tender; bowel sounds normal; no masses,  no organomegaly Extremities: 2+ pedal edema, Ulcer on L plantar foot is non draining and packed to prevent abscess formation Pulses: 2+ and symmetric Lab Results: BMET    Component Value Date/Time   NA 140 01/15/2014 0430   K 4.4 01/15/2014 0430   CL 101 01/15/2014 0430   CO2 26 01/15/2014 0430   GLUCOSE 156* 01/15/2014 0430   BUN 39* 01/15/2014 0430   CREATININE 2.53* 01/15/2014 0430   CREATININE 1.14 01/05/2014 1616   CALCIUM 8.4 01/15/2014 0430   GFRNONAA 26* 01/15/2014 0430   GFRAA 30* 01/15/2014 0430    CBC    Component Value Date/Time   WBC 15.1* 01/15/2014 0802   RBC 2.94* 01/15/2014 0802   RBC 2.98* 01/12/2014 2308   HGB 7.1* 01/15/2014 0802   HCT 21.1* 01/15/2014 0802   PLT 418* 01/15/2014 0802   MCV 71.8* 01/15/2014 0802   MCH 24.1* 01/15/2014 0802   MCHC 33.6 01/15/2014 0802   RDW 17.8* 01/15/2014 0802   LYMPHSABS 1.5 01/05/2014 1616   MONOABS 1.5* 01/05/2014 1616   EOSABS 0.1 01/05/2014 1616   BASOSABS 0.0 01/05/2014 1616     Micro Results: Recent Results (from the past 240 hour(s))  CULTURE, BLOOD (ROUTINE X 2)     Status: None   Collection Time    01/07/14  8:15 AM      Result Value Ref Range Status   Specimen Description BLOOD LEFT ARM   Final   Special Requests BOTTLES DRAWN AEROBIC AND ANAEROBIC 10CC   Final   Culture  Setup Time     Final   Value: 01/07/2014 14:02     Performed at [facility]   Culture     Final   Value: STAPHYLOCOCCUS SPECIES (COAGULASE NEGATIVE)     Note: THE SIGNIFICANCE OF ISOLATING THIS ORGANISM FROM A SINGLE SET OF BLOOD CULTURES WHEN MULTIPLE SETS ARE DRAWN IS UNCERTAIN. PLEASE NOTIFY THE MICROBIOLOGY DEPARTMENT WITHIN ONE WEEK IF SPECIATION AND SENSITIVITIES ARE REQUIRED.     Note: Gram Stain Report Called to,Read Back By and Verified With: [facility] 01/08/14 @ 5:56PM BY RUSCOE A.     Performed at [facility]   Report Status 01/09/2014 FINAL   Final  CULTURE, BLOOD (ROUTINE X 2)     Status: None   Collection Time    01/07/14  8:30 AM      Result Value Ref Range Status   Specimen Description BLOOD LEFT ARM   Final   Special Requests BOTTLES DRAWN AEROBIC AND ANAEROBIC 10CC   Final   Culture  Setup Time     Final   Value: 01/07/2014 14:02     Performed at [facility]   Culture     Final   Value: NO GROWTH 5 DAYS     Performed at [facility]   Report Status 01/13/2014 FINAL   Final  ANAEROBIC CULTURE     Status: None   Collection Time    01/11/14  8:31 AM      Result Value Ref Range Status   Specimen Description WOUND FOOT LEFT   Final   Special Requests     Final   Value: LEFT PLANTAR ULCER PT ON ZOSYN AND ZYVOX AND AUGMENTIN   Gram Stain     Final   Value: NO WBC SEEN     NO SQUAMOUS EPITHELIAL CELLS SEEN     NO ORGANISMS SEEN     Performed at [facility]   Culture     Final   Value: NO ANAEROBES ISOLATED; CULTURE IN PROGRESS FOR 5 DAYS     Performed at [facility]    Report Status PENDING   Incomplete  WOUND CULTURE     Status: None   Collection Time    01/11/14  8:31 AM      Result Value Ref Range Status   Specimen Description WOUND FOOT LEFT   Final   Special Requests LEFT PLANTAR ULCER PT ON ZYVOX,ZOSYN AND AUGMENTIN   Final   Gram Stain     Final   Value: NO WBC SEEN     NO SQUAMOUS EPITHELIAL CELLS SEEN     NO ORGANISMS SEEN     Performed at [facility]   Culture     Final   Value: FEW STAPHYLOCOCCUS AUREUS     Note: RIFAMPIN AND GENTAMICIN SHOULD NOT BE USED AS SINGLE DRUGS FOR TREATMENT OF STAPH INFECTIONS. This organism DOES NOT demonstrate inducible Clindamycin resistance in vitro.     Performed at [facility]   Report Status 01/14/2014 FINAL   Final   Organism ID, Bacteria STAPHYLOCOCCUS AUREUS   Final   Studies/Results: Left ABI: Calcified arterial pressures in left pedal area with pressures > 200. Triphasic pedal waveforms X 3 in foot with great toe pressure 14mHg. Brachial BP 1651mg. Purely from a vascular Standpoint the great toe pressure suggest adequate digital flow to support healing.   Medications: I have reviewed the patient's current medications. Scheduled Meds: . carvedilol  6.25 mg Oral BID WC  . cephALEXin  500 mg Oral 3 times per day  . ferrous sulfate  325 mg Oral TID WC  . hydrALAZINE  50 mg Oral 3 times per day  . insulin aspart  0-9 Units Subcutaneous TID WC  . isosorbide mononitrate  30 mg Oral Daily  . pantoprazole  20 mg Oral QAC breakfast  . potassium chloride  20 mEq Oral BID  . verapamil  120 mg Oral Daily  . warfarin  5 mg Oral ONCE-1800  . Warfarin - Pharmacist Dosing Inpatient   Does not apply q1800   Continuous Infusions: . sodium chloride Stopped (01/15/14 1057)   PRN Meds:.sodium chloride  Assessment/Plan: Principal Problem:   Acute on chronic diastolic heart failure Active Problems:   Hypertension   Diabetes mellitus with renal manifestation   Hypoalbuminemia   Diabetic  nephropathy with proteinuria   Cellulitis   Acute respiratory failure   New onset seizure   Acute renal failure   Acute acalculous cholecystitis   Elevated alkaline phosphatase level   Microcytic anemia   DVT of popliteal vein   Possible GI bleeding   Nephrotic syndrome   Anasarca   LOS: 9 days   ANTIBIOTICS:  Vanc 2/12 >>> 2/13  Linezolid 2/13 >>> 2/16  Pip-tazo 2/13 >>> 2/16  Augmentin 2/16 >>> 2/17  Unasyn 2/17 >>> 2/19  Zosyn 2/19 >>> 2/20 Cephalexin 2/20 >>>   Mr. Niess is a 63yo man with history of T2DM, HTN, L foot ulcer, previously treated extrapulmonary TB who has returned to IMTS from ICU with sepsis possibly due to acalculous cholecystitis, but patient is afebrile and most active issues include dCHF, AKI on CKD with nephrotic syndrome, and possible GIB.   # dCHF: Echo with EF of 55%, and grade 2 diastolic dysfunction on ECHO. He still have excess volume on board and he is symptomatic.  Plan  - off lasix now as recommended by renal  - Verapamil to 155m daily  - Coreg 6.25 mg bid - no change as patient is in acute CHF exacerbation  - hold off ACEI with AKI  - Follow up on Renal Labs  - Will aim for negative or even fluid balance. He is still positive since admission - PT recommended CIR consult once patient is ready for discharge.   # Iron-deficiency Anemia: Low normal. Hemoglobin of 8.2 in 2015, but 13.9 in 2010. Iron panel shows low iron stores (Iron 19, Ferritin 179), and inappropriately low retic index (0.2). FOBT is positive x2. No colonoscopy on file, but there was 3 negative FOBT in 10/2012. -GI recommends air-constrat barium enema to look for significant lesions in colon, and an upper GI series for peptic ulcers as outpatient.  Plan  - hemoglobin 7.1. Will transfuse with 1 unit of blood given his symptomatology and comorbidities. Goal above 8. -Protonix 276mPO qD: upper GI bleed and RUQ pain after eating  -Ferrous sulfate 32551mablet TID   # AKI on  CKD: In the setting of nephrotic syndrome and dCHF.  AKI is most likely caused by intra-renal causes as aggressive diuresis has not improved kidney functions. Renal scan, with a normal-sized kidneys, but noted increased parenchymal echogenicity, consistent with medical renal disease. No evidence of hydronephrosis. Cr stable at ~2.5.  - Nephrology recs appreciated - Hold off ACEI until AKI resolves  - consider renal artery duplex to rule out RVT. - coagulation panel ordered by nephrology. - Continue with anticoagulation   # HTN: improved. patient had several episode of BP in 180/100's. Goal pressure for proteinuric CKD is <130/80.  Plan -Continue with hydralazine to 38m73mD.  - Verapamil 120mg32m - Carvedilol 6.25mg 83m - Imdur 30 mg daily  # Septic shock likely due to acalculous cholecystitis: Acalculous cholecystitis was found on RUQ ultrasound with gallbladder wall thickening of 1.1cm with chronically elevated alk phos and GGT ( in 2013, in 2015) indicating liver as source of elevation in alk phos. AST and ALT are now normal. TBili and DBili remained normal. 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 CBC is still showing leukocytosis. Other causes for sepsis that are ruled out are systemic bacteremia (only 1 out  of 2 cultures with coagulase negative staph) and osteomyelitis (MRI does not show osteomyelitis). Patient was previously evaluated by surgery team, who recommended a HIDA scan. This did not show any hepatobiliary abnormalities. Outpatient followup for cholecystectomy was recommended once patient is medically stable.  # Diabetic foot ulcer: MRI of L foot did not show osteomyelitis, but did show local cellulitis and possible myositis. Wound debrided and packed to prevent abscess. Wound culture showed some Staph species and no anaerobes. ABI on left shows left pedal area with pressure >200, adequate for digital flow to support healing.  -Cephlaxin should cover staph species in  wound -Wound packing to prevent abscess  -Ortho follows.  # Acute DVT: Patient was found to have bilateral acute DVT without PE. Therapeutic on heparin with UFH of 0.44 and goal of 0.3-0.7. Supra therapeutic on Warfarin with INR of 3.92. Anticoagulation team following. If patient undergoes surgery, anticoagulation team will be notified to manage anticoagulation  -Anticoagulation team recs appreciated  - coumadin per pharmacy to achieve goal INR of 2-3 - will likely require anticoagulants for three months for first time symptomatic acute DVT   # Seizure: EEG showed medication effect. This is patient's first seizure in the setting of acute respiratory failure. This is most likely an isolated event. Patient does not have a history of substance abuse, and labs shows that electrolytes are in normal ranges.  -Taper Keppra by 1/2 for on 2/21, and stop on 2/22 per pharmacy recs  # Hypoalbuminemia:  Patient has albumin of 1.7 with protein gap of 5.3 from total protein. Patient has a baseline hypoalbuminemia. However, UPEP showed elevated kappa and lambda free light chains, but the kappa/lambda ratio is normal (4). We are less concerned for paraprotein phenomenon even though patient has anemia, AKI, and elevated alkaline phosphate. Most likely cause is nephrotic syndrome. Patient does not appear to have liver disease given normal liver appearance on ultrasound and normal AST/ALT, and the acalculous cholecystitis can explain the elevated alk phos level.  -SPEP follow-up   Proph:  DVT: already on UFH  Mobility: PT and OT request rehab consult for patient. Patient requires walker  Jessee Avers, MD PGY-2 Internal Medicine Teaching Service Pager: [phone]

## 2014-01-15 NOTE — Progress Notes (Signed)
Spoke with Dr.Glenn about nephrologist note from today, was told to give tonight's dose of coumadin and team will re-address in the AM

## 2014-01-15 NOTE — Progress Notes (Signed)
Patient ID: Otila Kluver, male   DOB: [birth year], 63 y.o.   MRN: [id number] S:no new complaints O:BP 120/61  Pulse 84  Temp(Src) 98.6 F (37 C) (Oral)  Resp 18  Ht 5\' 6"  (1.676 m)  Wt 71.442 kg (157 lb 8 oz)  BMI 25.43 kg/m2  SpO2 99%  Intake/Output Summary (Last 24 hours) at 01/15/14 1435 Last data filed at 01/15/14 1241  Gross per 24 hour  Intake    660 ml  Output   1251 ml  Net   -591 ml   Intake/Output: I/O last 3 completed shifts: In: [id number] [P.O.:1180; I.V.:240] Out: [id number] [Urine:3050; Stool:1]  Intake/Output this shift:  Total I/O In: 120 [P.O.:120] Out: -  Weight change:  Gen:WD WN AM in NAd CVS:no rub Resp:bibasilar crackles Abd:+BS, soft, NT Ext:2+ edema bilateral lower ext   Recent Labs Lab 01/09/14 0428 01/10/14 0440 01/11/14 0605 01/11/14 1115 01/12/14 0551 01/13/14 0413 01/14/14 0415 01/15/14 0430  NA 141 139 140  --  140 141 141 140  K 3.6* 3.7 3.7  --  3.5* 3.6* 3.9 4.4  CL 107 105 104  --  105 104 103 101  CO2 23 22 22   --  23 24 25 26   GLUCOSE 80 154* 145*  --  123* 131* 134* 156*  BUN 26* 22 23  --  30* 30* 31* 39*  CREATININE 1.52* 1.53* 1.77*  --  2.05* 1.99* 2.28* 2.53*  ALBUMIN 1.3*  --   --  1.6*  --   --   --  1.5*  CALCIUM 7.7* 8.1* 8.4  --  8.5 8.2* 8.4 8.4  PHOS  --   --   --   --   --   --   --  4.7*  AST 66*  --   --  36  --   --   --   --   ALT 94*  --   --  66*  --   --   --   --    Liver Function Tests:  Recent Labs Lab 01/09/14 0428 01/11/14 1115 01/15/14 0430  AST 66* 36  --   ALT 94* 66*  --   ALKPHOS 154* 236*  --   BILITOT 0.2* 0.4  --   PROT 5.9* 7.3  --   ALBUMIN 1.3* 1.6* 1.5*   No results found for this basename: LIPASE, AMYLASE,  in the last 168 hours No results found for this basename: AMMONIA,  in the last 168 hours CBC:  Recent Labs Lab 01/12/14 1350 01/14/14 0415 01/14/14 1325 01/15/14 0430 01/15/14 0802  WBC 19.0* 14.8* 17.5* 15.3* 15.1*  HGB 8.0* 7.0* 8.1* 6.7* 7.1*  HCT 25.0* 21.5*  25.0* 20.9* 21.1*  MCV 73.1* 72.1* 72.5* 71.8* 71.8*  PLT 424* 404* 498* 450* 418*   Cardiac Enzymes:  Recent Labs Lab 01/14/14 2130  CKTOTAL 155   CBG:  Recent Labs Lab 01/14/14 1143 01/14/14 1634 01/14/14 2125 01/15/14 0802 01/15/14 1146  GLUCAP 208* 138* 192* 121* 146*    Iron Studies: No results found for this basename: IRON, TIBC, TRANSFERRIN, FERRITIN,  in the last 72 hours Studies/Results: US Renal  01/14/2014   CLINICAL DATA:  Acute kidney injury.  Renal insufficiency.  EXAM: RENAL/URINARY TRACT ULTRASOUND COMPLETE  COMPARISON:  None.  FINDINGS: Right Kidney:  Length: 11.6 cm. Diffusely increased parenchymal echogenicity seen, consistent with medical renal disease. No mass or hydronephrosis visualized.  Left Kidney:  Length: 11.9 cm. Diffusely increased parenchymal  echogenicity seen, consistent with medical renal disease. No mass or hydronephrosis visualized. Left pleural effusion also noted.  Bladder:  Empty with Foley catheter in place.  IMPRESSION: Normal renal size with increased parenchymal echogenicity, consistent with medical renal disease. No evidence of hydronephrosis.  Left pleural effusion incidentally noted.   Electronically Signed   By: Earle Gell M.D.   On: 01/14/2014 21:09   . carvedilol  6.25 mg Oral BID WC  . cephALEXin  500 mg Oral 3 times per day  . ferrous sulfate  325 mg Oral TID WC  . hydrALAZINE  50 mg Oral 3 times per day  . insulin aspart  0-9 Units Subcutaneous TID WC  . isosorbide mononitrate  30 mg Oral Daily  . pantoprazole  20 mg Oral QAC breakfast  . potassium chloride  20 mEq Oral BID  . verapamil  120 mg Oral Daily  . warfarin  5 mg Oral ONCE-1800  . Warfarin - Pharmacist Dosing Inpatient   Does not apply q1800    BMET    Component Value Date/Time   NA 140 01/15/2014 0430   K 4.4 01/15/2014 0430   CL 101 01/15/2014 0430   CO2 26 01/15/2014 0430   GLUCOSE 156* 01/15/2014 0430   BUN 39* 01/15/2014 0430   CREATININE 2.53* 01/15/2014  0430   CREATININE 1.14 01/05/2014 1616   CALCIUM 8.4 01/15/2014 0430   GFRNONAA 26* 01/15/2014 0430   GFRAA 30* 01/15/2014 0430   CBC    Component Value Date/Time   WBC 15.1* 01/15/2014 0802   RBC 2.94* 01/15/2014 0802   RBC 2.98* 01/12/2014 2308   HGB 7.1* 01/15/2014 0802   HCT 21.1* 01/15/2014 0802   PLT 418* 01/15/2014 0802   MCV 71.8* 01/15/2014 0802   MCH 24.1* 01/15/2014 0802   MCHC 33.6 01/15/2014 0802   RDW 17.8* 01/15/2014 0802   LYMPHSABS 1.5 01/05/2014 1616   MONOABS 1.5* 01/05/2014 1616   EOSABS 0.1 01/05/2014 1616   BASOSABS 0.0 01/05/2014 1616     Assessment/Plan:  1. AKI- in setting of acute illness, infected left foot, multiple antibiotics, aggressive diuresis in setting of bilateral DVT's and longstanding nephrotic-range proteinuria.  1. Would hold diuretics as this is due to DVT and nephrosis which is worsening renal perfusion/ischemic ATN 2. Serologies ordered and would recommend switching to IV heparin and plan for renal biopsy next week when safe to proceed 3. Renal dose meds 4. Order renal US and also r/o renal vein thrombosis with duplex given hypercoagulable state 5. Follow renal function. 2. Edema/Proteinuria- SPEP/UPEP without monoclonal proteins, however 24 hour not quantified.  1. Collect 24 hour urine for protein quantification 2. Likely anasarca due to nephrotic syndrome and loss of Prot C/S/ATIII may have contributed to DVT's 3. Diabetic foot ulcer/infection- on abx, plan per primary svc 4. Bilateral DVT's- possibly related to nephrotic syndrome (loss of ATIII) but also need to r/o anti-phospholipid ab and other hypercoagulable states. Also would recommend switching back to heparin gtt in order to allow for renal biopsy in order to help identify underlying renal disease. Will need to d/w primary team and IR in hopes for next week sometime 1. INR supratherapeutic and would recommend to stop coumadin and transition to IV heparin when INR falls below 2 and hold for  renal biopsy sometime next week when IR able. 5. HTN- stable 6. DM- per primary svc 7. A[place]culus cholecystitis with Abnormal LFT's- follow 8. ABLA- low iron stores, guaiac stools and r/o AIHA 9.  [place] A

## 2014-01-15 NOTE — Progress Notes (Signed)
Subjective: Pt experiencing 8/10 pain this AM in left foot.  He just received pain medication at the beginning of the examination.  Pt states he has been able to ambulate with his post op shoe with no complications.  Pt denies N/V/F/C, chest pain, SOB, calf pain, or changes in appetite.  Objective: Vital signs in last 24 hours: Temp:  [97.6 F (36.4 C)-98.3 F (36.8 C)] 98.3 F (36.8 C) (02/21 0450) Pulse Rate:  [61-94] 94 (02/21 1005) Resp:  [18] 18 (02/21 0450) BP: (108-151)/(68-84) 143/68 mmHg (02/21 1005) SpO2:  [92 %-100 %] 92 % (02/21 0450) Weight:  [71.442 kg (157 lb 8 oz)] 71.442 kg (157 lb 8 oz) (02/21 0700)  Intake/Output from previous day: 02/20 0701 - 02/21 0700 In: 1000 [P.O.:880; I.V.:120] Out: 2251 [Urine:2250; Stool:1] Intake/Output this shift:     Recent Labs  01/12/14 1350 01/14/14 0415 01/14/14 1325 01/15/14 0430 01/15/14 0802  HGB 8.0* 7.0* 8.1* 6.7* 7.1*    Recent Labs  01/15/14 0430 01/15/14 0802  WBC 15.3* 15.1*  RBC 2.91* 2.94*  HCT 20.9* 21.1*  PLT 450* 418*    Recent Labs  01/14/14 0415 01/15/14 0430  NA 141 140  K 3.9 4.4  CL 103 101  CO2 25 26  BUN 31* 39*  CREATININE 2.28* 2.53*  GLUCOSE 134* 156*  CALCIUM 8.4 8.4    Recent Labs  01/14/14 0415 01/15/14 0430  INR 3.92* 2.72*    NAD, A/Ox3, appears stated age.  EOMI, mood and affect normal, respirations unlabored.  Pt presents this AM sitting up in chair resting. Dressing had been changed by nursing staff early this AM and appears to be in proper placement and in good repair with no saturation noted.  Dressing unwrapped iodoform gauze in place without excessive saturation of drainage.  Mild purulent d/c noted with edema to midfoot extedning distally to toes.  Toes mobile and well perfused.  DP pulse 1+ b/l. Dressing rewrapped.   Assessment/Plan: Pt to continue antibiotics and dressing changes.  Will re-evaluate tomorrow with Dr. Doran Durand for possible surgical washout.   Continue medical management as dictated by Dr. Linus Salmons.   Estefanny Moler S 01/15/2014, 11:27 AM

## 2014-01-16 LAB — BASIC METABOLIC PANEL
BUN: 42 mg/dL — AB (ref 6–23)
CHLORIDE: 101 meq/L (ref 96–112)
CO2: 26 mEq/L (ref 19–32)
Calcium: 8.7 mg/dL (ref 8.4–10.5)
Creatinine, Ser: 2.19 mg/dL — ABNORMAL HIGH (ref 0.50–1.35)
GFR calc non Af Amer: 30 mL/min — ABNORMAL LOW (ref >90)
GFR, EST AFRICAN AMERICAN: 35 mL/min — AB (ref >90)
GLUCOSE: 141 mg/dL — AB (ref 70–99)
POTASSIUM: 4.3 meq/L (ref 3.7–5.3)
SODIUM: 138 meq/L (ref 137–147)

## 2014-01-16 LAB — PROTIME-INR
INR: 1.84 — ABNORMAL HIGH (ref 0.00–1.49)
Prothrombin Time: 20.7 seconds — ABNORMAL HIGH (ref 11.6–15.2)

## 2014-01-16 LAB — TYPE AND SCREEN
ABO/RH(D): O POS
Antibody Screen: NEGATIVE
UNIT DIVISION: 0
Unit division: 0

## 2014-01-16 LAB — ANAEROBIC CULTURE: GRAM STAIN: NONE SEEN

## 2014-01-16 LAB — CBC
HEMATOCRIT: 24.3 % — AB (ref 39.0–52.0)
HEMOGLOBIN: 7.9 g/dL — AB (ref 13.0–17.0)
MCH: 24 pg — ABNORMAL LOW (ref 26.0–34.0)
MCHC: 32.5 g/dL (ref 30.0–36.0)
MCV: 73.9 fL — ABNORMAL LOW (ref 78.0–100.0)
Platelets: 427 10*3/uL — ABNORMAL HIGH (ref 150–400)
RBC: 3.29 MIL/uL — ABNORMAL LOW (ref 4.22–5.81)
RDW: 18.3 % — ABNORMAL HIGH (ref 11.5–15.5)
WBC: 15.8 10*3/uL — AB (ref 4.0–10.5)

## 2014-01-16 LAB — PROTEIN, URINE, 24 HOUR
Collection Interval-UPROT: 24 hours
Protein, 24H Urine: 1667 mg/d — ABNORMAL HIGH (ref 50–100)
Protein, Urine: 171 mg/dL
Urine Total Volume-UPROT: 975 mL

## 2014-01-16 LAB — GLUCOSE, CAPILLARY
GLUCOSE-CAPILLARY: 119 mg/dL — AB (ref 70–99)
GLUCOSE-CAPILLARY: 196 mg/dL — AB (ref 70–99)
Glucose-Capillary: 211 mg/dL — ABNORMAL HIGH (ref 70–99)

## 2014-01-16 LAB — HEPARIN LEVEL (UNFRACTIONATED): HEPARIN UNFRACTIONATED: 0.47 [IU]/mL (ref 0.30–0.70)

## 2014-01-16 MED ORDER — WARFARIN SODIUM 7.5 MG PO TABS
7.5000 mg | ORAL_TABLET | ORAL | Status: DC
  Filled 2014-01-16: qty 1

## 2014-01-16 MED ORDER — CARVEDILOL 12.5 MG PO TABS
12.5000 mg | ORAL_TABLET | Freq: Two times a day (BID) | ORAL | Status: DC
  Administered 2014-01-16 – 2014-01-21 (×10): 12.5 mg via ORAL
  Filled 2014-01-16 (×12): qty 1

## 2014-01-16 MED ORDER — HEPARIN (PORCINE) IN NACL 100-0.45 UNIT/ML-% IJ SOLN
1600.0000 [IU]/h | INTRAMUSCULAR | Status: DC
  Administered 2014-01-16: 1600 [IU]/h via INTRAVENOUS
  Filled 2014-01-16 (×3): qty 250

## 2014-01-16 NOTE — Progress Notes (Signed)
Pharmacy Note-Anticoagulation  Pharmacy Consult :  63 y.o. male is currently on Coumadin with Heparin bridging for DVT.  Patient is continuing Cephalexin for Staph coag negative diabetic foot infection.  Latest Labs : Hematology :  Recent Labs  01/14/14 0415 01/14/14 1325 01/15/14 0430 01/15/14 0802 01/16/14 0540  HGB 7.0* 8.1* 6.7* 7.1* 7.9*  HCT 21.5* 25.0* 20.9* 21.1* 24.3*  PLT 404* 498* 450* 418* 427*  LABPROT 36.9*  --  27.9*  --  20.7*  INR 3.92*  --  2.72*  --  1.84*  HEPARINUNFRC 0.61  --  0.71*  --   --     Lab Results  Component Value Date   INR 1.84* 01/16/2014   INR 2.72* 01/15/2014   INR 3.92* 01/14/2014        HEPARINUNFRC 0.71* 01/15/2014   HEPARINUNFRC 0.61 01/14/2014   HEPARINUNFRC 0.44 01/13/2014        HGB 7.9* 01/16/2014   HGB 7.1* 01/15/2014   HGB 6.7* 01/15/2014    Current Medication[s] Include: Scheduled:  Scheduled:  . carvedilol  12.5 mg Oral BID WC  . cephALEXin  500 mg Oral 3 times per day  . ferrous sulfate  325 mg Oral TID WC  . hydrALAZINE  50 mg Oral 3 times per day  . insulin aspart  0-9 Units Subcutaneous TID WC  . isosorbide mononitrate  30 mg Oral Daily  . pantoprazole  20 mg Oral QAC breakfast  . potassium chloride  20 mEq Oral BID  . verapamil  120 mg Oral Daily   Infusion[s]: Infusions:  . sodium chloride Stopped (01/15/14 1057)  . heparin     Antibiotic[s]: Anti-infectives   Start     Dose/Rate Route Frequency Ordered Stop   01/14/14 1430  cephALEXin (KEFLEX) capsule 500 mg     500 mg Oral 3 times per day 01/14/14 1422        Assessment :  Today's INR is Sub-therapeutic.   INR is 1.84.  All doses given.    Heparin bridging to be restarted today in preparation for surgery, biopsy.  No bleeding complications observed.  Goal :  INR goal is 2-3    Heparin goal is Heparin level 0.3-0.7 units/ml.  Plan : 1. Heparin will be restarted, no bolus, at 1600 units/hr [previous rate].   The next Heparin Level will be due  in 6 hours @ 1700 pm. 2. Hold Coumadin until after surgery, biopsy. 3. Daily Heparin level, INR, CBC, and Monitor for bleeding complications.  Edwardo Wojnarowski, Craig Guess, Pharm.D. 01/16/2014  9:31 AM

## 2014-01-16 NOTE — Clinical Social Work Placement (Signed)
Clinical Social Work Department CLINICAL SOCIAL WORK PLACEMENT NOTE 01/16/2014  Patient:  NATANEL, ANGOTTI  Account Number:  [id number] Admit date:  01/06/2014  Clinical Social Worker:  Kemper Durie, [place]  Date/time:  01/16/2014 10:03 AM  Clinical Social Work is seeking post-discharge placement for this patient at the following level of care:   [place]   (*CSW will update this form in Epic as items are completed)   01/15/2014  Patient/family provided with [facility] Department of Clinical Social Work's list of facilities offering this level of care within the geographic area requested by the patient (or if unable, by the patient's family).  01/15/2014  Patient/family informed of their freedom to choose among providers that offer the needed level of care, that participate in Medicare, Medicaid or managed care program needed by the patient, have an available bed and are willing to accept the patient.  01/15/2014  Patient/family informed of [facility]' ownership interest in [facility], as well as of the fact that they are under no obligation to receive care at this facility.  PASARR submitted to EDS on 01/15/2014 PASARR number received from [employer] on 01/15/2014  FL2 transmitted to all facilities in geographic area requested by pt/family on  01/16/2014 FL2 transmitted to all facilities within larger geographic area on 01/16/2014  Patient informed that his/her managed care company has contracts with or will negotiate with  certain facilities, including the following:     Patient/family informed of bed offers received:   Patient chooses bed at  Physician recommends and patient chooses bed at    Patient to be transferred to  on   Patient to be transferred to facility by   The following physician request were entered in Epic:   Additional Comments:  Patient will be a LOG SNF placement. CSW has faxed patient out but unit CSW will also make calls to  facilities known to accept LOGs.   Liz Beach, [place], [place], [phone]

## 2014-01-16 NOTE — Progress Notes (Signed)
ANTICOAGULATION CONSULT NOTE - Follow Up Consult  Pharmacy Consult for heparin Indication: DVT  No Known Allergies  Patient Measurements: Height: 5\' 6"  (167.6 cm) Weight: 156 lb 15.5 oz (71.2 kg) IBW/kg (Calculated) : 63.8 Heparin Dosing Weight: 71kg  Vital Signs: Temp: 98 F (36.7 C) (02/22 1400) Temp src: Oral (02/22 1400) BP: 152/88 mmHg (02/22 1723) Pulse Rate: 84 (02/22 1723)  Labs:  Recent Labs  01/14/14 0415  01/14/14 2130 01/15/14 0430 01/15/14 0802 01/16/14 0540 01/16/14 1725  HGB 7.0*  < >  --  6.7* 7.1* 7.9*  --   HCT 21.5*  < >  --  20.9* 21.1* 24.3*  --   PLT 404*  < >  --  450* 418* 427*  --   LABPROT 36.9*  --   --  27.9*  --  20.7*  --   INR 3.92*  --   --  2.72*  --  1.84*  --   HEPARINUNFRC 0.61  --   --  0.71*  --   --  0.47  CREATININE 2.28*  --   --  2.53*  --  2.19*  --   CKTOTAL  --   --  155  --   --   --   --   < > = values in this interval not displayed.  Estimated Creatinine Clearance: 31.6 ml/min (by C-G formula based on Cr of 2.19).  Assessment: 69 YOM on heparin bridging to warfarin for DVT. INR fell to subtherapeutic level today and heparin was restarted. Initial level after restart is therapeutic at 0.47 units/mL. Hgb low but stable, platelets elevated. No bleeding noted.  Goal of Therapy:  Heparin level 0.3-0.7 units/ml Monitor platelets by anticoagulation protocol: Yes   Plan:  1. Continue heparin at 1600 units/hr 2. Daily HL and CBC 3. Follow for s/s bleeding and ability to stop heparin when INR therapeutic for >24 hours  Audreana Hancox D. Zakyah Yanes, PharmD, BCPS Clinical Pharmacist Pager: [phone] 01/16/2014 7:20 PM

## 2014-01-16 NOTE — Progress Notes (Addendum)
Patient ID: Joshua [name], male   DOB: 05/04/1951, 63 y.o.   MRN: [id number]  Upon review of notes will continue to defer definitive management to Dr Doran Durand Continue antibiotics  Regular diet today  NPO after midnight in case Doran Durand able to address tomorrow versus Tuesday  Off coumadin on IV heparin drip May require renal biopsy this week as well Will need 2-6 hours off for surgery and biopsy  Addendum:  I'll plan to look at the patient's foot Monday morning and decide whether he needs to go to the OR or not.  Surgery likely Tuesday afternoon if necessary.

## 2014-01-16 NOTE — Progress Notes (Signed)
Subjective:  No overnight events. He feels better but complaining that he is hungry today since he was made NPO due to possible surgery.   Objective: Vital signs in last 24 hours: Filed Vitals:   01/15/14 2350 01/16/14 0524 01/16/14 0758 01/16/14 0902  BP: 130/68 147/79 167/95 134/73  Pulse: 79 80 86   Temp: 98.4 F (36.9 C) 97.8 F (36.6 C)    TempSrc: Oral Oral    Resp: 20 20    Height:      Weight:  156 lb 15.5 oz (71.2 kg)    SpO2: 99% 99%     Weight change: -5.3 oz (-0.151 kg)  Intake/Output Summary (Last 24 hours) at 01/16/14 1057 Last data filed at 01/16/14 1601  Gross per 24 hour  Intake 1317.5 ml  Output   1000 ml  Net  317.5 ml   Physical Exam: BP 134/73  Pulse 86  Temp(Src) 97.8 F (36.6 C) (Oral)  Resp 20  Ht '5\' 6"'  (1.676 m)  Wt 156 lb 15.5 oz (71.2 kg)  BMI 25.35 kg/m2  SpO2 99% General appearance: alert, cooperative and no distress Neck: JVD - 6 cm above sternal notch Lungs: Diminished RLL sounds, no egophony, no tactile fermitus, tympanic to percussion Heart: Faint to none S4 or S1 split, regular rate, no murmur Abdomen: soft, non-tender; bowel sounds normal; no masses,  no organomegaly Extremities: 2+ pedal edema, Ulcer on L plantar foot is non draining and packed to prevent abscess formation Pulses: 2+ and symmetric Lab Results: BMET    Component Value Date/Time   NA 138 01/16/2014 0540   K 4.3 01/16/2014 0540   CL 101 01/16/2014 0540   CO2 26 01/16/2014 0540   GLUCOSE 141* 01/16/2014 0540   BUN 42* 01/16/2014 0540   CREATININE 2.19* 01/16/2014 0540   CREATININE 1.14 01/05/2014 1616   CALCIUM 8.7 01/16/2014 0540   GFRNONAA 30* 01/16/2014 0540   GFRAA 35* 01/16/2014 0540    CBC    Component Value Date/Time   WBC 15.8* 01/16/2014 0540   RBC 3.29* 01/16/2014 0540   RBC 2.98* 01/12/2014 2308   HGB 7.9* 01/16/2014 0540   HCT 24.3* 01/16/2014 0540   PLT 427* 01/16/2014 0540   MCV 73.9* 01/16/2014 0540   MCH 24.0* 01/16/2014 0540   MCHC 32.5 01/16/2014  0540   RDW 18.3* 01/16/2014 0540   LYMPHSABS 1.5 01/05/2014 1616   MONOABS 1.5* 01/05/2014 1616   EOSABS 0.1 01/05/2014 1616   BASOSABS 0.0 01/05/2014 1616     Micro Results: Recent Results (from the past 240 hour(s))  CULTURE, BLOOD (ROUTINE X 2)     Status: None   Collection Time    01/07/14  8:15 AM      Result Value Ref Range Status   Specimen Description BLOOD LEFT ARM   Final   Special Requests BOTTLES DRAWN AEROBIC AND ANAEROBIC 10CC   Final   Culture  Setup Time     Final   Value: 01/07/2014 14:02     Performed at [facility]   Culture     Final   Value: STAPHYLOCOCCUS SPECIES (COAGULASE NEGATIVE)     Note: THE SIGNIFICANCE OF ISOLATING THIS ORGANISM FROM A SINGLE SET OF BLOOD CULTURES WHEN MULTIPLE SETS ARE DRAWN IS UNCERTAIN. PLEASE NOTIFY THE MICROBIOLOGY DEPARTMENT WITHIN ONE WEEK IF SPECIATION AND SENSITIVITIES ARE REQUIRED.     Note: Gram Stain Report Called to,Read Back By and Verified With: [facility] 01/08/14 @ 5:56PM BY RUSCOE A.  Performed at [facility]   Report Status 01/09/2014 FINAL   Final  CULTURE, BLOOD (ROUTINE X 2)     Status: None   Collection Time    01/07/14  8:30 AM      Result Value Ref Range Status   Specimen Description BLOOD LEFT ARM   Final   Special Requests BOTTLES DRAWN AEROBIC AND ANAEROBIC 10CC   Final   Culture  Setup Time     Final   Value: 01/07/2014 14:02     Performed at [facility]   Culture     Final   Value: NO GROWTH 5 DAYS     Performed at [facility]   Report Status 01/13/2014 FINAL   Final  ANAEROBIC CULTURE     Status: None   Collection Time    01/11/14  8:31 AM      Result Value Ref Range Status   Specimen Description WOUND FOOT LEFT   Final   Special Requests     Final   Value: LEFT PLANTAR ULCER PT ON ZOSYN AND ZYVOX AND AUGMENTIN   Gram Stain     Final   Value: NO WBC SEEN     NO SQUAMOUS EPITHELIAL CELLS SEEN     NO ORGANISMS SEEN     Performed at [facility]    Culture     Final   Value: NO ANAEROBES ISOLATED     Performed at [facility]   Report Status 01/16/2014 FINAL   Final  WOUND CULTURE     Status: None   Collection Time    01/11/14  8:31 AM      Result Value Ref Range Status   Specimen Description WOUND FOOT LEFT   Final   Special Requests LEFT PLANTAR ULCER PT ON ZYVOX,ZOSYN AND AUGMENTIN   Final   Gram Stain     Final   Value: NO WBC SEEN     NO SQUAMOUS EPITHELIAL CELLS SEEN     NO ORGANISMS SEEN     Performed at [facility]   Culture     Final   Value: FEW STAPHYLOCOCCUS AUREUS     Note: RIFAMPIN AND GENTAMICIN SHOULD NOT BE USED AS SINGLE DRUGS FOR TREATMENT OF STAPH INFECTIONS. This organism DOES NOT demonstrate inducible Clindamycin resistance in vitro.     Performed at [facility]   Report Status 01/14/2014 FINAL   Final   Organism ID, Bacteria STAPHYLOCOCCUS AUREUS   Final   Studies/Results: Left ABI: Calcified arterial pressures in left pedal area with pressures > 200. Triphasic pedal waveforms X 3 in foot with great toe pressure 145mHg. Brachial BP 169mg. Purely from a vascular Standpoint the great toe pressure suggest adequate digital flow to support healing.   Medications: I have reviewed the patient's current medications. Scheduled Meds: . carvedilol  12.5 mg Oral BID WC  . cephALEXin  500 mg Oral 3 times per day  . ferrous sulfate  325 mg Oral TID WC  . hydrALAZINE  50 mg Oral 3 times per day  . insulin aspart  0-9 Units Subcutaneous TID WC  . isosorbide mononitrate  30 mg Oral Daily  . pantoprazole  20 mg Oral QAC breakfast  . potassium chloride  20 mEq Oral BID  . verapamil  120 mg Oral Daily   Continuous Infusions: . sodium chloride Stopped (01/15/14 1057)  . heparin 1,600 Units/hr (01/16/14 1056)   PRN Meds:.sodium chloride Assessment/Plan: Principal Problem:  Acute on chronic diastolic heart failure Active Problems:   Hypertension   Diabetes mellitus with renal  manifestation   Hypoalbuminemia   Diabetic nephropathy with proteinuria   Cellulitis   Acute respiratory failure   New onset seizure   Acute renal failure   Acute acalculous cholecystitis   Elevated alkaline phosphatase level   Microcytic anemia   DVT of popliteal vein   Possible GI bleeding   Nephrotic syndrome   Anasarca   LOS: 10 days   ANTIBIOTICS summary:  Vanc 2/12 >>> 2/13  Linezolid 2/13 >>> 2/16  Pip-tazo 2/13 >>> 2/16  Augmentin 2/16 >>> 2/17  Unasyn 2/17 >>> 2/19  Zosyn 2/19 >>> 2/20 Cephalexin 2/20 >>>   Joshua [name] is a 63yo man with history of T2DM, HTN, L foot ulcer, previously treated extrapulmonary TB who has returned to IMTS from ICU with sepsis possibly due to acalculous cholecystitis, but patient is afebrile and most active issues include dCHF, AKI on CKD with nephrotic syndrome, and possible GIB.   # dCHF: Echo with EF of 55%, and grade 2 diastolic dysfunction on ECHO. He still have excess volume on board and he is symptomatic with dyspnea and requiring supplementary O2 therapy.  Plan  - off lasix now as recommended by renal  - Verapamil to 162m daily  - increased Coreg 12.5 mg bid mainly for BP control.  - hold off ACEI with AKI  - Will aim for negative or even fluid balance. Still making urine but +5L up  - PT recommended CIR consult once patient is ready for discharge.   # Iron-deficiency Anemia: Low normal hemoglobin. Hemoglobin of 8.2 in 2015, but 13.9 in 2010. Iron panel shows low iron stores (Iron 19, Ferritin 179), and inappropriately low retic index (0.2). FOBT is positive x2. No colonoscopy on file, but there was 3 negative FOBT in 10/2012. -GI recommends air- barium enema and an upper GI series for peptic ulcers as outpatient as he is no a candidate for scoping due to clinical status. Transfused with one 1 unit of PRBCs.  Plan  - postransfusion Hbg 7.9. Goal above 8 given his co-morbidities. - will check again tomorrow a.m and see if he need  more transfusion -Protonix 249mPO qD: upper GI bleed and RUQ pain after eating  -Ferrous sulfate 32563mablet TID   # AKI on CKD: In the setting of nephrotic syndrome and dCHF.  AKI is most likely caused by intra-renal causes as aggressive diuresis has not improved kidney functions. Renal scan, with a normal-sized kidneys, but noted increased parenchymal echogenicity, consistent with medical renal disease. No evidence of hydronephrosis. Cr stable at ~2.5. Renal scan consistent with medical renal disease. No hydronephrosis. - Nephrology recs appreciated - Hold off ACEI until AKI resolves  - consider renal artery duplex to rule out RVT. - coagulation panel -  Hold coumadin for possible surgery by ortho and renal biospy - restart heparin this am  # HTN: still somehow elevated today. Goal pressure for proteinuric CKD is <130/80.  Plan -Continue with hydralazine to 69m75mD.  - Verapamil 120mg17m - increased Carvedilol to 12.5mg B57m - Imdur 30 mg daily  # Septic shock likely due to acalculous cholecystitis: Acalculous cholecystitis was found on RUQ ultrasound with gallbladder wall thickening of 1.1cm with chronically elevated alk phos and GGT ( in 2013, in 2015) indicating liver as source of elevation in alk phos. AST and ALT are now normal. TBili and DBili remained normal. 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 CBC is still showing leukocytosis. Other causes for sepsis that are ruled out are systemic bacteremia (only 1 out of 2 cultures with coagulase negative staph) and osteomyelitis (MRI does not show osteomyelitis). Patient was previously evaluated by surgery team, who recommended a HIDA scan. This did not show any hepatobiliary abnormalities. Outpatient followup for cholecystectomy was recommended once patient is medically stable.  # Diabetic foot ulcer: MRI of L foot did not show osteomyelitis, but did show local cellulitis and possible myositis. Wound debrided and packed to  prevent abscess. Wound culture showed some Staph species and no anaerobes. ABI on left shows left pedal area with pressure >200, adequate for digital flow to support healing.  -Cephlaxin should cover staph species in wound -Wound packing to prevent abscess  -Ortho contemplating surgery on Monday or Tuesday.  # Acute DVT: Patient was found to have bilateral acute DVT without PE. Therapeutic on heparin with UFH of 0.44 and goal of 0.3-0.7. Supra therapeutic on Warfarin with INR of 3.92. Anticoagulation team following. If patient undergoes surgery, anticoagulation team will be notified to manage anticoagulation. He require anticoagulants for three months for first time symptomatic acute DVT .  - switched to heparin due to possible surgery  - will restart coumadin after procedures  # Seizure: resolved issue. Most likely seizures were induced by acute decompensation prior to his ICU course. EEG showed medication effect. Off keppra now.  # Hypoalbuminemia:  Patient has albumin of 1.7 with protein gap of 5.3 from total protein. Patient has a baseline hypoalbuminemia. However, UPEP showed elevated kappa and lambda free light chains, but the kappa/lambda ratio is normal (4). We are less concerned for paraprotein phenomenon even though patient has anemia, AKI, and elevated alkaline phosphate. Most likely cause is nephrotic syndrome. Patient does not appear to have liver disease given normal liver appearance on ultrasound and normal AST/ALT, and the acalculous cholecystitis can explain the elevated alk phos level.  -SPEP follow-up   Proph:  DVT: already on UFH  Mobility: PT and OT request rehab consult for patient. Patient requires walker  Jessee Avers, MD PGY-2 Internal Medicine Teaching Service Pager: [phone]

## 2014-01-16 NOTE — Progress Notes (Signed)
Patient ID: Joshua [name], male   DOB: 04-24-1951, 63 y.o.   MRN: [id number] S:feels well O:BP 145/75  Pulse 86  Temp(Src) 97.8 F (36.6 C) (Oral)  Resp 20  Ht 5\' 6"  (1.676 m)  Wt 71.2 kg (156 lb 15.5 oz)  BMI 25.35 kg/m2  SpO2 99%  Intake/Output Summary (Last 24 hours) at 01/16/14 1313 Last data filed at 01/16/14 0926  Gross per 24 hour  Intake 1197.5 ml  Output   1000 ml  Net  197.5 ml   Intake/Output: I/O last 3 completed shifts: In: 917.5 [P.O.:320; I.V.:250; Blood:12.5; Other:335] Out: 750 [Urine:750]  Intake/Output this shift:  Total I/O In: 600 [P.O.:600] Out: 1000 [Urine:1000] Weight change: -0.151 kg (-5.3 oz) Gen:WD WN AM in NAD CVS:no rub Resp:decreased BS at bases Abd:+BS, soft, NT Ext:2+ edema bilateral lower ext   Recent Labs Lab 01/10/14 0440 01/11/14 0605 01/11/14 1115 01/12/14 0551 01/13/14 0413 01/14/14 0415 01/15/14 0430 01/16/14 0540  NA 139 140  --  140 141 141 140 138  K 3.7 3.7  --  3.5* 3.6* 3.9 4.4 4.3  CL 105 104  --  105 104 103 101 101  CO2 22 22  --  23 24 25 26 26   GLUCOSE 154* 145*  --  123* 131* 134* 156* 141*  BUN 22 23  --  30* 30* 31* 39* 42*  CREATININE 1.53* 1.77*  --  2.05* 1.99* 2.28* 2.53* 2.19*  ALBUMIN  --   --  1.6*  --   --   --  1.5*  --   CALCIUM 8.1* 8.4  --  8.5 8.2* 8.4 8.4 8.7  PHOS  --   --   --   --   --   --  4.7*  --   AST  --   --  36  --   --   --   --   --   ALT  --   --  66*  --   --   --   --   --    Liver Function Tests:  Recent Labs Lab 01/11/14 1115 01/15/14 0430  AST 36  --   ALT 66*  --   ALKPHOS 236*  --   BILITOT 0.4  --   PROT 7.3  --   ALBUMIN 1.6* 1.5*   No results found for this basename: LIPASE, AMYLASE,  in the last 168 hours No results found for this basename: AMMONIA,  in the last 168 hours CBC:  Recent Labs Lab 01/14/14 0415 01/14/14 1325 01/15/14 0430 01/15/14 0802 01/16/14 0540  WBC 14.8* 17.5* 15.3* 15.1* 15.8*  HGB 7.0* 8.1* 6.7* 7.1* 7.9*  HCT 21.5* 25.0*  20.9* 21.1* 24.3*  MCV 72.1* 72.5* 71.8* 71.8* 73.9*  PLT 404* 498* 450* 418* 427*   Cardiac Enzymes:  Recent Labs Lab 01/14/14 2130  CKTOTAL 155   CBG:  Recent Labs Lab 01/15/14 1146 01/15/14 1658 01/15/14 2119 01/16/14 0742 01/16/14 1142  GLUCAP 146* 185* 167* 119* 211*    Iron Studies: No results found for this basename: IRON, TIBC, TRANSFERRIN, FERRITIN,  in the last 72 hours Studies/Results: US Renal  01/14/2014   CLINICAL DATA:  Acute kidney injury.  Renal insufficiency.  EXAM: RENAL/URINARY TRACT ULTRASOUND COMPLETE  COMPARISON:  None.  FINDINGS: Right Kidney:  Length: 11.6 cm. Diffusely increased parenchymal echogenicity seen, consistent with medical renal disease. No mass or hydronephrosis visualized.  Left Kidney:  Length: 11.9 cm. Diffusely increased parenchymal echogenicity  seen, consistent with medical renal disease. No mass or hydronephrosis visualized. Left pleural effusion also noted.  Bladder:  Empty with Foley catheter in place.  IMPRESSION: Normal renal size with increased parenchymal echogenicity, consistent with medical renal disease. No evidence of hydronephrosis.  Left pleural effusion incidentally noted.   Electronically Signed   By: Earle Gell M.D.   On: 01/14/2014 21:09   . carvedilol  12.5 mg Oral BID WC  . cephALEXin  500 mg Oral 3 times per day  . ferrous sulfate  325 mg Oral TID WC  . hydrALAZINE  50 mg Oral 3 times per day  . insulin aspart  0-9 Units Subcutaneous TID WC  . isosorbide mononitrate  30 mg Oral Daily  . pantoprazole  20 mg Oral QAC breakfast  . potassium chloride  20 mEq Oral BID  . verapamil  120 mg Oral Daily    BMET    Component Value Date/Time   NA 138 01/16/2014 0540   K 4.3 01/16/2014 0540   CL 101 01/16/2014 0540   CO2 26 01/16/2014 0540   GLUCOSE 141* 01/16/2014 0540   BUN 42* 01/16/2014 0540   CREATININE 2.19* 01/16/2014 0540   CREATININE 1.14 01/05/2014 1616   CALCIUM 8.7 01/16/2014 0540   GFRNONAA 30* 01/16/2014 0540    GFRAA 35* 01/16/2014 0540   CBC    Component Value Date/Time   WBC 15.8* 01/16/2014 0540   RBC 3.29* 01/16/2014 0540   RBC 2.98* 01/12/2014 2308   HGB 7.9* 01/16/2014 0540   HCT 24.3* 01/16/2014 0540   PLT 427* 01/16/2014 0540   MCV 73.9* 01/16/2014 0540   MCH 24.0* 01/16/2014 0540   MCHC 32.5 01/16/2014 0540   RDW 18.3* 01/16/2014 0540   LYMPHSABS 1.5 01/05/2014 1616   MONOABS 1.5* 01/05/2014 1616   EOSABS 0.1 01/05/2014 1616   BASOSABS 0.0 01/05/2014 1616     Assessment/Plan:  1. AKI- in setting of acute illness, infected left foot, multiple antibiotics, aggressive diuresis in setting of bilateral DVT's and longstanding nephrotic-range proteinuria.  1. Improved BUN/Cr with holding diuretics as this is due to DVT and nephrosis which is worsening renal perfusion/ischemic ATN 2. Serologies ordered and would recommend switching to IV heparin and plan for renal biopsy next week when safe to proceed 3. Renal dose meds 4. Renal US without hydro 5. Consider Duplex to r/o renal vein thrombosis given hypercoagulable state 6. Follow renal function. 7. Plan for renal biopsy later this week. 2. Edema/Proteinuria- SPEP/UPEP without monoclonal proteins, however 24 hour protein (1700mg ) not as large as projected with Uprot/cr ratio earlier in the hospitalization and likely due to inadequate specimen as total volume was only 970cc on urine collection but the total recorded Output on that day was 2700cc, therefore 1/3 of total volume.    1. Drop in proteinuria most likely related to inadequate specimen collection or may be related to the AKI.  Regardless, would still proceed with renal biopsy in light of all other issues. 2. ?diastolic CHF vs nephrotic syndrome as cause of edema (intially supported anasarca due to nephrotic syndrome due to hypoalbuminemia and loss of Prot C/S/ATIII may have contributed to DVT's).   3. Diabetic foot ulcer/infection- on abx, plan per primary svc and ortho 4. Bilateral DVT's-  possibly related to nephrotic syndrome but also need to r/o anti-phospholipid ab and other hypercoagulable states. Also would recommend switching back to heparin gtt in order to allow for renal biopsy in order to help identify underlying renal disease.  Will need to d/w primary team and IR in hopes for next week sometime  1. INR supratherapeutic and would recommend to stop coumadin and transition to IV heparin when INR falls below 2 and hold for renal biopsy sometime next week when IR able. 5. HTN- stable 6. DM- per primary svc 7. Acalculus cholecystitis with Abnormal LFT's- follow 8. ABLA- low iron stores, guaiac stools and r/o AIHA 9.   Tesla Bochicchio A

## 2014-01-17 ENCOUNTER — Inpatient Hospital Stay (HOSPITAL_COMMUNITY): Payer: Self-pay | Admitting: Anesthesiology

## 2014-01-17 ENCOUNTER — Encounter (HOSPITAL_COMMUNITY): Payer: Self-pay | Admitting: Anesthesiology

## 2014-01-17 ENCOUNTER — Encounter (HOSPITAL_COMMUNITY)
Admission: AD | Disposition: A | Discharge status: Skilled Nursing Facility | Payer: Self-pay | Source: Ambulatory Visit | Attending: Internal Medicine

## 2014-01-17 DIAGNOSIS — R269 Unspecified abnormalities of gait and mobility: Secondary | ICD-10-CM

## 2014-01-17 DIAGNOSIS — R569 Unspecified convulsions: Secondary | ICD-10-CM

## 2014-01-17 DIAGNOSIS — I803 Phlebitis and thrombophlebitis of lower extremities, unspecified: Secondary | ICD-10-CM

## 2014-01-17 DIAGNOSIS — R5381 Other malaise: Secondary | ICD-10-CM

## 2014-01-17 HISTORY — PX: I&D EXTREMITY: SHX5045

## 2014-01-17 LAB — HEPARIN LEVEL (UNFRACTIONATED): Heparin Unfractionated: 0.73 IU/mL — ABNORMAL HIGH (ref 0.30–0.70)

## 2014-01-17 LAB — GLUCOSE, CAPILLARY
GLUCOSE-CAPILLARY: 155 mg/dL — AB (ref 70–99)
Glucose-Capillary: 126 mg/dL — ABNORMAL HIGH (ref 70–99)
Glucose-Capillary: 124 mg/dL — ABNORMAL HIGH (ref 70–99)
Glucose-Capillary: 142 mg/dL — ABNORMAL HIGH (ref 70–99)

## 2014-01-17 LAB — COMPLEMENT, TOTAL

## 2014-01-17 LAB — LUPUS ANTICOAGULANT PANEL
DRVVT INCUBATED 1 1 MIX: 36.7 s (ref <42.9)
DRVVT: 50.3 secs — ABNORMAL HIGH (ref <42.9)
LUPUS ANTICOAGULANT: DETECTED — AB
PTT Lupus Anticoagulant: 200 secs — ABNORMAL HIGH (ref 28.0–43.0)
PTTLA 41 MIX: 200 s — AB (ref 28.0–43.0)
PTTLA Confirmation: 26.9 secs — ABNORMAL HIGH (ref <8.0)

## 2014-01-17 LAB — BASIC METABOLIC PANEL
BUN: 41 mg/dL — AB (ref 6–23)
CHLORIDE: 102 meq/L (ref 96–112)
CO2: 27 mEq/L (ref 19–32)
Calcium: 8.5 mg/dL (ref 8.4–10.5)
Creatinine, Ser: 1.78 mg/dL — ABNORMAL HIGH (ref 0.50–1.35)
GFR calc Af Amer: 45 mL/min — ABNORMAL LOW (ref >90)
GFR calc non Af Amer: 39 mL/min — ABNORMAL LOW (ref >90)
Glucose, Bld: 179 mg/dL — ABNORMAL HIGH (ref 70–99)
POTASSIUM: 4.8 meq/L (ref 3.7–5.3)
Sodium: 139 mEq/L (ref 137–147)

## 2014-01-17 LAB — PROTEIN C, TOTAL: Protein C, Total: 39 % — ABNORMAL LOW (ref 72–160)

## 2014-01-17 LAB — PROTEIN ELECTROPHORESIS, SERUM
ALPHA-2-GLOBULIN: 18.8 % — AB (ref 7.1–11.8)
Albumin ELP: 28.1 % — ABNORMAL LOW (ref 55.8–66.1)
Alpha-1-Globulin: 9.7 % — ABNORMAL HIGH (ref 2.9–4.9)
Beta 2: 10.1 % — ABNORMAL HIGH (ref 3.2–6.5)
Beta Globulin: 7.2 % (ref 4.7–7.2)
Gamma Globulin: 26.1 % — ABNORMAL HIGH (ref 11.1–18.8)
M-Spike, %: NOT DETECTED g/dL
TOTAL PROTEIN ELP: 6.1 g/dL (ref 6.0–8.3)

## 2014-01-17 LAB — BETA-2-GLYCOPROTEIN I ABS, IGG/M/A
BETA 2 GLYCO I IGG: 7 G Units (ref <20)
BETA-2-GLYCOPROTEIN I IGA: 6 A Units (ref <20)
Beta-2-Glycoprotein I IgM: 4 M Units (ref <20)

## 2014-01-17 LAB — CBC
HCT: 26.1 % — ABNORMAL LOW (ref 39.0–52.0)
Hemoglobin: 8.3 g/dL — ABNORMAL LOW (ref 13.0–17.0)
MCH: 23.9 pg — ABNORMAL LOW (ref 26.0–34.0)
MCHC: 31.8 g/dL (ref 30.0–36.0)
MCV: 75.2 fL — AB (ref 78.0–100.0)
PLATELETS: 423 10*3/uL — AB (ref 150–400)
RBC: 3.47 MIL/uL — AB (ref 4.22–5.81)
RDW: 18.5 % — AB (ref 11.5–15.5)
WBC: 16.2 10*3/uL — AB (ref 4.0–10.5)

## 2014-01-17 LAB — PROTIME-INR
INR: 1.85 — ABNORMAL HIGH (ref 0.00–1.49)
PROTHROMBIN TIME: 20.8 s — AB (ref 11.6–15.2)

## 2014-01-17 LAB — ANA: Anti Nuclear Antibody(ANA): NEGATIVE

## 2014-01-17 LAB — GLOMERULAR BASEMENT MEMBRANE ANTIBODIES: GBM Ab: <1

## 2014-01-17 LAB — PROTEIN S, TOTAL: Protein S Ag, Total: 70 % (ref 60–150)

## 2014-01-17 LAB — MPO/PR-3 (ANCA) ANTIBODIES: Myeloperoxidase Abs: <1

## 2014-01-17 LAB — CARDIOLIPIN ANTIBODIES, IGG, IGM, IGA
ANTICARDIOLIPIN IGG: 22 GPL U/mL (ref <23)
ANTICARDIOLIPIN IGM: 10 [MPL'U]/mL — AB (ref <11)
Anticardiolipin IgA: 26 APL U/mL — ABNORMAL HIGH (ref <22)

## 2014-01-17 LAB — ANTI-DNA ANTIBODY, DOUBLE-STRANDED: ds DNA Ab: 4 IU/mL

## 2014-01-17 SURGERY — IRRIGATION AND DEBRIDEMENT EXTREMITY
Anesthesia: General | Laterality: Left

## 2014-01-17 MED ORDER — ONDANSETRON HCL 4 MG/2ML IJ SOLN
INTRAMUSCULAR | Status: DC | PRN
  Administered 2014-01-17: 4 mg via INTRAVENOUS

## 2014-01-17 MED ORDER — OXYCODONE HCL 5 MG/5ML PO SOLN
5.0000 mg | Freq: Once | ORAL | Status: AC | PRN
Start: 2014-01-17 — End: 2014-01-17

## 2014-01-17 MED ORDER — ONDANSETRON HCL 4 MG/2ML IJ SOLN: 4.0000 mg | Freq: Four times a day (QID) | INTRAMUSCULAR | Status: DC | PRN

## 2014-01-17 MED ORDER — ONDANSETRON HCL 4 MG/2ML IJ SOLN
INTRAMUSCULAR | Status: AC
  Filled 2014-01-17: qty 2

## 2014-01-17 MED ORDER — HYDROCODONE-ACETAMINOPHEN 5-325 MG PO TABS: 1.0000 | ORAL_TABLET | ORAL | Status: DC | PRN

## 2014-01-17 MED ORDER — PROPOFOL 10 MG/ML IV BOLUS
INTRAVENOUS | Status: DC | PRN
  Administered 2014-01-17: 150 mg via INTRAVENOUS

## 2014-01-17 MED ORDER — MIDAZOLAM HCL 2 MG/2ML IJ SOLN
INTRAMUSCULAR | Status: AC
  Filled 2014-01-17: qty 2

## 2014-01-17 MED ORDER — LIDOCAINE HCL (CARDIAC) 20 MG/ML IV SOLN
INTRAVENOUS | Status: DC | PRN
  Administered 2014-01-17: 80 mg via INTRAVENOUS

## 2014-01-17 MED ORDER — EPHEDRINE SULFATE 50 MG/ML IJ SOLN
INTRAMUSCULAR | Status: DC | PRN
  Administered 2014-01-17 (×4): 10 mg via INTRAVENOUS

## 2014-01-17 MED ORDER — GLYCOPYRROLATE 0.2 MG/ML IJ SOLN
INTRAMUSCULAR | Status: DC | PRN
  Administered 2014-01-17: 0.2 mg via INTRAVENOUS

## 2014-01-17 MED ORDER — SODIUM CHLORIDE 0.9 % IV SOLN
INTRAVENOUS | Status: DC | PRN
  Administered 2014-01-17 (×2): via INTRAVENOUS

## 2014-01-17 MED ORDER — HEPARIN (PORCINE) IN NACL 100-0.45 UNIT/ML-% IJ SOLN
1500.0000 [IU]/h | INTRAMUSCULAR | Status: DC
  Administered 2014-01-18: 1500 [IU]/h via INTRAVENOUS
  Filled 2014-01-17 (×2): qty 250

## 2014-01-17 MED ORDER — PROPOFOL 10 MG/ML IV BOLUS
INTRAVENOUS | Status: AC
  Filled 2014-01-17: qty 20

## 2014-01-17 MED ORDER — FENTANYL CITRATE 0.05 MG/ML IJ SOLN
INTRAMUSCULAR | Status: AC
  Filled 2014-01-17: qty 5

## 2014-01-17 MED ORDER — OXYCODONE HCL 5 MG PO TABS: 5.0000 mg | ORAL_TABLET | Freq: Once | ORAL | Status: AC | PRN

## 2014-01-17 MED ORDER — CHLORHEXIDINE GLUCONATE 4 % EX LIQD
60.0000 mL | Freq: Once | CUTANEOUS | Status: AC
  Administered 2014-01-17: 4 via TOPICAL
  Filled 2014-01-17: qty 60

## 2014-01-17 MED ORDER — HYDROMORPHONE HCL PF 1 MG/ML IJ SOLN: 0.2500 mg | INTRAMUSCULAR | Status: DC | PRN

## 2014-01-17 MED ORDER — HEPARIN (PORCINE) IN NACL 100-0.45 UNIT/ML-% IJ SOLN
1500.0000 [IU]/h | INTRAMUSCULAR | Status: DC
  Filled 2014-01-17: qty 250

## 2014-01-17 MED ORDER — SODIUM CHLORIDE 0.9 % IR SOLN
Status: DC | PRN
  Administered 2014-01-17: 3000 mL

## 2014-01-17 MED ORDER — MORPHINE SULFATE 2 MG/ML IJ SOLN: 1.0000 mg | INTRAMUSCULAR | Status: DC | PRN

## 2014-01-17 SURGICAL SUPPLY — 31 items
BANDAGE ELASTIC 4 VELCRO ST LF (GAUZE/BANDAGES/DRESSINGS) ×3 IMPLANT
BANDAGE ESMARK 6X9 LF (GAUZE/BANDAGES/DRESSINGS) ×1 IMPLANT
BANDAGE GAUZE ELAST BULKY 4 IN (GAUZE/BANDAGES/DRESSINGS) ×3 IMPLANT
BNDG ESMARK 6X9 LF (GAUZE/BANDAGES/DRESSINGS) ×3
COVER SURGICAL LIGHT HANDLE (MISCELLANEOUS) ×3 IMPLANT
DRAIN PENROSE 1/2X12 LTX STRL (WOUND CARE) ×3 IMPLANT
DRAPE U-SHAPE 47X51 STRL (DRAPES) ×3 IMPLANT
DRSG PAD ABDOMINAL 8X10 ST (GAUZE/BANDAGES/DRESSINGS) ×6 IMPLANT
ELECT REM PT RETURN 9FT ADLT (ELECTROSURGICAL) ×3
ELECTRODE REM PT RTRN 9FT ADLT (ELECTROSURGICAL) ×1 IMPLANT
GLOVE BIO SURGEON STRL SZ7 (GLOVE) ×3 IMPLANT
GLOVE BIO SURGEON STRL SZ8 (GLOVE) ×3 IMPLANT
GLOVE BIOGEL PI IND STRL 7.5 (GLOVE) ×1 IMPLANT
GLOVE BIOGEL PI IND STRL 8 (GLOVE) ×1 IMPLANT
GLOVE BIOGEL PI INDICATOR 7.5 (GLOVE) ×2
GLOVE BIOGEL PI INDICATOR 8 (GLOVE) ×2
GOWN STRL REUS W/ TWL LRG LVL3 (GOWN DISPOSABLE) ×2 IMPLANT
GOWN STRL REUS W/TWL LRG LVL3 (GOWN DISPOSABLE) ×4
KIT BASIN OR (CUSTOM PROCEDURE TRAY) ×3 IMPLANT
KIT ROOM TURNOVER OR (KITS) ×3 IMPLANT
NS IRRIG 1000ML POUR BTL (IV SOLUTION) ×3 IMPLANT
PACK ORTHO EXTREMITY (CUSTOM PROCEDURE TRAY) ×3 IMPLANT
PAD ARMBOARD 7.5X6 YLW CONV (MISCELLANEOUS) ×6 IMPLANT
SOAP 2 % CHG 4 OZ (WOUND CARE) ×3 IMPLANT
SPONGE GAUZE 4X4 12PLY STER LF (GAUZE/BANDAGES/DRESSINGS) ×3 IMPLANT
SPONGE LAP 18X18 X RAY DECT (DISPOSABLE) ×3 IMPLANT
TOWEL OR 17X26 10 PK STRL BLUE (TOWEL DISPOSABLE) ×3 IMPLANT
TUBE CONNECTING 12'X1/4 (SUCTIONS) ×1
TUBE CONNECTING 12X1/4 (SUCTIONS) ×2 IMPLANT
TUBING CYSTO DISP (UROLOGICAL SUPPLIES) ×3 IMPLANT
YANKAUER SUCT BULB TIP NO VENT (SUCTIONS) ×3 IMPLANT

## 2014-01-17 NOTE — Consult Note (Signed)
Physical Medicine and Rehabilitation Consult Reason for Consult: Deconditioning/CHF/DVT/diabetic foot ulceration Referring Physician: [facility]   HPI: Joshua [name] is a 63 y.o. right-handed male with history of hypertension as well as type 2 diabetes mellitus. Admitted 01/07/2014 with left foot ulcer associated with chills, lightheadedness and nausea. White blood cell count 13,400 hemoglobin 8.2 as was creatinine 1.75. Cranial CT scan with no acute intracranial findings. Chest x-ray with vascular congestion suspect CHF. CT of the chest showed moderate left larger than right bilateral pleural effusions. Lower extremity Doppler showed acute bilateral lower extremity DVTs. Patient with ongoing bouts of hypoxia and hypotension required intubation. EEG showed no seizure activity. Placed on intravenous fluids were 2 diastolic heart failure. Maintained on Coumadin and heparin therapy for DVT. Renal services followup with increasing creatinine 2.72 suspect AKI in setting of acute illness. Followup orthopedic services Dr. Doran Durand left foot ulcer maintained on antibiotic therapy question plan for possible surgical washout. Patient was extubated 01/08/2014. Physical and occupational therapy evaluations completed and ongoing for deconditioning. Recommendations of physical medicine and rehabilitation consult  Review of Systems  Constitutional: Positive for chills.       Lightheadedness  Respiratory: Positive for cough.   Gastrointestinal: Positive for nausea.  Neurological:       Numbness left foot  All other systems reviewed and are negative.   Past Medical History  Diagnosis Date  . 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 Hypertension 10/14/2012  . Type 2 diabetes mellitus   . Diabetic nephropathy with proteinuria   . 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 Knee arthroscopy  2010    3 times, right knee   No family  history on file. Social History:  reports that he quit smoking about 21 years ago. His smoking use included Cigarettes. He smoked 0.00 packs per day for 20 years. He has never used smokeless tobacco. He reports that he does not drink alcohol or use illicit drugs. Allergies: No Known Allergies Medications Prior to Admission  Medication Sig Dispense Refill  . lisinopril (PRINIVIL,ZESTRIL) 40 MG tablet Take 1 tablet (40 mg total) by mouth daily.  30 tablet  2  . metFORMIN (GLUCOPHAGE) 500 MG tablet Take 2 tablets (1,000 mg total) by mouth 2 (two) times daily with a meal.  60 tablet  2  . verapamil (CALAN-SR) 180 MG CR tablet Take 1 tablet (180 mg total) by mouth daily.  30 tablet  2  . [DISCONTINUED] hydrochlorothiazide (MICROZIDE) 12.5 MG capsule Take 12.5 mg by mouth daily.      Marland Kitchen glucose monitoring kit (FREESTYLE) monitoring kit 1 each by Does not apply route as needed for other.  1 each      Home: Home Living Family/patient expects to be discharged to:: Inpatient rehab Living Arrangements: Alone Available Help at Discharge: Family (cousin and cousin's wife) Type of Home: House Home Access: Level entry Home Layout: Two level Alternate Level Stairs-Number of Steps: 13 Alternate Level Stairs-Rails: None Home Equipment: Cane - single point Additional Comments: PTA patient lived alone and was Mod I with a cane.  Cousin states that patient can discharge to his home if he still needs assistance after a rehab stay.  Cousin's home and patient's home has a tub shower combo with a curtain  Functional History: Prior Function Comments: occationally would use a cane Functional Status:  Mobility:     Ambulation/Gait Ambulation Distance (Feet): 20 Feet Gait velocity: decreased  General Gait Details: Pt leaning to the right during gait reporting that his right arm was tired/fatigued.  His left foot is elevated due to post op shoe and is intensifying his right lean.      ADL: ADL Grooming:  Simulated;Min guard Where Assessed - Grooming: Supported standing Upper Body Bathing: Simulated;Set up Where Assessed - Upper Body Bathing: Supported sitting Lower Body Bathing: Simulated;Moderate assistance Where Assessed - Lower Body Bathing: Supported sitting;Supported standing Lower Body Dressing: Simulated;Performed;Maximal assistance Toilet Transfer: Simulated;+2 Total assistance (second person to stabilize walker for sit>stand) Toilet Transfer Method: Sit to stand Transfers/Ambulation Related to ADLs: sit><stand from recliner 2 times, declined need to toilet.  Second person needed for sit>stand to stabilize walker.  Post op shoe on left foot also sliding during sit><stand. ADL Comments: Patient able to doff and donn right sock however unable to donn or doff post op shoe.  Cognition: Cognition Overall Cognitive Status: Within Functional Limits for tasks assessed Orientation Level: Oriented X4 Cognition Arousal/Alertness: Awake/alert Behavior During Therapy: WFL for tasks assessed/performed Overall Cognitive Status: Within Functional Limits for tasks assessed  Blood pressure 147/78, pulse 73, temperature 97.8 F (36.6 C), temperature source Oral, resp. rate 16, height _0  (1.676 m), weight 71.2 kg (156 lb 15.5 oz), SpO2 99.00%. Physical Exam  Vitals reviewed. Constitutional: He is oriented to person, place, and time.  HENT:  Head: Normocephalic.  Eyes: EOM are normal.  Neck: Normal range of motion. Neck supple. No thyromegaly present.  Cardiovascular: Normal rate and regular rhythm.   Respiratory:  Lungs decreased breath sounds at the bases  GI: Soft. Bowel sounds are normal. He exhibits no distension.  Neurological: He is alert and oriented to person, place, and time.  Follow simple commands. Moves all 4 favoring the left foot. Decreased sensation distal LE's  Skin:  Left foot dressing in place with some dried blood.. ischemic changes to right lower extremity    Psychiatric:  Flat     Results for orders placed during the hospital encounter of 01/06/14 (from the past 24 hour(s))  GLUCOSE, CAPILLARY     Status: Abnormal   Collection Time    01/16/14  7:42 AM      Result Value Ref Range   Glucose-Capillary 119 (*) 70 - 99 mg/dL  GLUCOSE, CAPILLARY     Status: Abnormal   Collection Time    01/16/14 11:42 AM      Result Value Ref Range   Glucose-Capillary 211 (*) 70 - 99 mg/dL  HEPARIN LEVEL (UNFRACTIONATED)     Status: None   Collection Time    01/16/14  5:25 PM      Result Value Ref Range   Heparin Unfractionated 0.47  0.30 - 0.70 IU/mL  GLUCOSE, CAPILLARY     Status: Abnormal   Collection Time    01/16/14  8:35 PM      Result Value Ref Range   Glucose-Capillary 196 (*) 70 - 99 mg/dL  PROTIME-INR     Status: Abnormal   Collection Time    01/17/14  5:40 AM      Result Value Ref Range   Prothrombin Time 20.8 (*) 11.6 - 15.2 seconds   INR 1.85 (*) 0.00 - 1.49  HEPARIN LEVEL (UNFRACTIONATED)     Status: Abnormal   Collection Time    01/17/14  5:40 AM      Result Value Ref Range   Heparin Unfractionated 0.73 (*) 0.30 - 0.70 IU/mL  BASIC METABOLIC PANEL  Status: Abnormal   Collection Time    01/17/14  5:40 AM      Result Value Ref Range   Sodium 139  137 - 147 mEq/L   Potassium 4.8  3.7 - 5.3 mEq/L   Chloride 102  96 - 112 mEq/L   CO2 27  19 - 32 mEq/L   Glucose, Bld 179 (*) 70 - 99 mg/dL   BUN 41 (*) 6 - 23 mg/dL   Creatinine, Ser 1.78 (*) 0.50 - 1.35 mg/dL   Calcium 8.5  8.4 - 10.5 mg/dL   GFR calc non Af Amer 39 (*) >90 mL/min   GFR calc Af Amer 45 (*) >90 mL/min  CBC     Status: Abnormal   Collection Time    01/17/14  5:40 AM      Result Value Ref Range   WBC 16.2 (*) 4.0 - 10.5 K/uL   RBC 3.47 (*) 4.22 - 5.81 MIL/uL   Hemoglobin 8.3 (*) 13.0 - 17.0 g/dL   HCT 26.1 (*) 39.0 - 52.0 %   MCV 75.2 (*) 78.0 - 100.0 fL   MCH 23.9 (*) 26.0 - 34.0 pg   MCHC 31.8  30.0 - 36.0 g/dL   RDW 18.5 (*) 11.5 - 15.5 %   Platelets  423 (*) 150 - 400 K/uL   No results found.  Assessment/Plan: Diagnosis: seizures, left foot wound, multiple medical---subsequent deconditioning 1. Does the need for close, 24 hr/day medical supervision in concert with the patient's rehab needs make it unreasonable for this patient to be served in a less intensive setting? Potentially 2. Co-Morbidities requiring supervision/potential complications: see above 3. Due to bladder management, bowel management, safety, disease management, medication administration, pain management and patient education, does the patient require 24 hr/day rehab nursing? Potentially 4. Does the patient require coordinated care of a physician, rehab nurse, PT, OT to address physical and functional deficits in the context of the above medical diagnosis(es)? Potentially Addressing deficits in the following areas: balance, endurance, locomotion, strength, transferring, bowel/bladder control, bathing, dressing, feeding, grooming and toileting 5. Can the patient actively participate in an intensive therapy program of at least 3 hrs of therapy per day at least 5 days per week? Potentially 6. The potential for patient to make measurable gains while on inpatient rehab is good and fair 7. Anticipated functional outcomes upon discharge from inpatient rehab are TBD with PT, TBD with OT, TBD with SLP. 8. Estimated rehab length of stay to reach the above functional goals is: TBD 9. Does the patient have adequate social supports to accommodate these discharge functional goals? No and Potentially 10. Anticipated D/C setting: Home 11. Anticipated post D/C treatments: [place] therapy 12. Overall Rehab/Functional Prognosis: fair  RECOMMENDATIONS: This patient's condition is appropriate for continued rehabilitative care in the following setting: SNF (?CIR) Patient has agreed to participate in recommended program. Potentially Note that insurance prior authorization may be required for  reimbursement for recommended care.  Comment: Few social supports. May need left BKA. Will follow for medical and functional progress. For [place] to consider CIR, he will need someone available at home upon dc.   Meredith Staggers, MD, [facility]     01/17/2014

## 2014-01-17 NOTE — Brief Op Note (Signed)
01/06/2014 - 01/17/2014  8:39 PM  PATIENT:  Joshua [name]  63 y.o. male  PRE-OPERATIVE DIAGNOSIS:  left foot diabetic abscess  POST-OPERATIVE DIAGNOSIS:  same  Procedure(s): IRRIGATION AND DEBRIDEMENT  LEFT FOOT abscess including multiple dorsal and plantar areas  SURGEON:  Wylene Simmer, MD  ASSISTANT: n/a  ANESTHESIA:   General  EBL:  minimal   TOURNIQUET:   Total Tourniquet Time Documented: Calf (Left) - 16 minutes Total: Calf (Left) - 16 minutes  SPECIMEN:  Deep tissue to micro  COMPLICATIONS:  None apparent  DISPOSITION:  Extubated, awake and stable to recovery.  DICTATION ID:  [id number]

## 2014-01-17 NOTE — Progress Notes (Signed)
Medical Student Daily Progress Note  Subjective: No events overnight. NPO overnight for I&D this afternoon.   Objective: Vital signs in last 24 hours: Filed Vitals:   01/16/14 1723 01/16/14 2049 01/17/14 0500 01/17/14 0908  BP: 152/88 143/90 147/78 163/88  Pulse: 84 75 73 83  Temp:  97.9 F (36.6 C) 97.8 F (36.6 C)   TempSrc:      Resp:  18 16   Height:      Weight:      SpO2:  99% 99%    Weight change:   Intake/Output Summary (Last 24 hours) at 01/17/14 8280 Last data filed at 01/17/14 0500  Gross per 24 hour  Intake   1680 ml  Output   2550 ml  Net   -870 ml   Physical Exam: BP 163/88  Pulse 83  Temp(Src) 97.8 F (36.6 C) (Oral)  Resp 16  Ht '5\' 6"'  (1.676 m)  Wt 71.2 kg (156 lb 15.5 oz)  BMI 25.35 kg/m2  SpO2 99% General appearance: alert, cooperative and no distress Lungs: clear to auscultation bilaterally Heart: regular rate and rhythm, S1, S2 normal, no murmur, click, rub or gallop Abdomen: Soft, non tender, slightly distended, active bowel sounds Extremities: 1+ pitting edema, packed ulcer on L plantar surface below the large toe Pulses: 2+ and symmetric Lab Results: BMET    Component Value Date/Time   NA 139 01/17/2014 0540   K 4.8 01/17/2014 0540   CL 102 01/17/2014 0540   CO2 27 01/17/2014 0540   GLUCOSE 179* 01/17/2014 0540   BUN 41* 01/17/2014 0540   CREATININE 1.78* 01/17/2014 0540   CREATININE 1.14 01/05/2014 1616   CALCIUM 8.5 01/17/2014 0540   GFRNONAA 39* 01/17/2014 0540   GFRAA 45* 01/17/2014 0540    CBC    Component Value Date/Time   WBC 16.2* 01/17/2014 0540   RBC 3.47* 01/17/2014 0540   RBC 2.98* 01/12/2014 2308   HGB 8.3* 01/17/2014 0540   HCT 26.1* 01/17/2014 0540   PLT 423* 01/17/2014 0540   MCV 75.2* 01/17/2014 0540   MCH 23.9* 01/17/2014 0540   MCHC 31.8 01/17/2014 0540   RDW 18.5* 01/17/2014 0540   LYMPHSABS 1.5 01/05/2014 1616   MONOABS 1.5* 01/05/2014 1616   EOSABS 0.1 01/05/2014 1616   BASOSABS 0.0 01/05/2014 1616    Lab Results   Component Value Date   INR 1.85* 01/17/2014   INR 1.84* 01/16/2014   INR 2.72* 01/15/2014    Micro Results: Recent Results (from the past 240 hour(s))  ANAEROBIC CULTURE     Status: None   Collection Time    01/11/14  8:31 AM      Result Value Ref Range Status   Specimen Description WOUND FOOT LEFT   Final   Special Requests     Final   Value: LEFT PLANTAR ULCER PT ON ZOSYN AND ZYVOX AND AUGMENTIN   Gram Stain     Final   Value: NO WBC SEEN     NO SQUAMOUS EPITHELIAL CELLS SEEN     NO ORGANISMS SEEN     Performed at [facility]   Culture     Final   Value: NO ANAEROBES ISOLATED     Performed at [facility]   Report Status 01/16/2014 FINAL   Final  WOUND CULTURE     Status: None   Collection Time    01/11/14  8:31 AM      Result Value Ref Range Status   Specimen  Description WOUND FOOT LEFT   Final   Special Requests LEFT PLANTAR ULCER PT ON ZYVOX,ZOSYN AND AUGMENTIN   Final   Gram Stain     Final   Value: NO WBC SEEN     NO SQUAMOUS EPITHELIAL CELLS SEEN     NO ORGANISMS SEEN     Performed at [facility]   Culture     Final   Value: FEW STAPHYLOCOCCUS AUREUS     Note: RIFAMPIN AND GENTAMICIN SHOULD NOT BE USED AS SINGLE DRUGS FOR TREATMENT OF STAPH INFECTIONS. This organism DOES NOT demonstrate inducible Clindamycin resistance in vitro.     Performed at [facility]   Report Status 01/14/2014 FINAL   Final   Organism ID, Bacteria STAPHYLOCOCCUS AUREUS   Final   Studies/Results: No results found. Medications: I have reviewed the patient's current medications. Scheduled Meds: . carvedilol  12.5 mg Oral BID WC  . cephALEXin  500 mg Oral 3 times per day  . ferrous sulfate  325 mg Oral TID WC  . hydrALAZINE  50 mg Oral 3 times per day  . insulin aspart  0-9 Units Subcutaneous TID WC  . isosorbide mononitrate  30 mg Oral Daily  . pantoprazole  20 mg Oral QAC breakfast  . potassium chloride  20 mEq Oral BID  . verapamil  120 mg Oral  Daily   Continuous Infusions: . heparin 1,600 Units/hr (01/16/14 1056)   PRN Meds:.sodium chloride Assessment/Plan: Principal Problem:   Acute on chronic diastolic heart failure Active Problems:   Hypertension   Diabetes mellitus with renal manifestation   Hypoalbuminemia   Diabetic nephropathy with proteinuria   Cellulitis   Acute respiratory failure   New onset seizure   Acute renal failure   Acute acalculous cholecystitis   Elevated alkaline phosphatase level   Microcytic anemia   DVT of popliteal vein   Possible GI bleeding   Nephrotic syndrome   Anasarca   LOS: 11 days   ANTIBIOTICS summary:  Vanc 2/12 >>> 2/13  Linezolid 2/13 >>> 2/16  Pip-tazo 2/13 >>> 2/16  Augmentin 2/16 >>> 2/17  Unasyn 2/17 >>> 2/19  Zosyn 2/19 >>> 2/20  Cephalexin 2/20 >>>   Joshua [name] is a 63yo man with history of T2DM, HTN, L foot ulcer, previously treated extrapulmonary TB who has returned to IMTS from ICU with sepsis possibly due to acalculous cholecystitis. The most active issues include dCHF, AKI on CKD with nephrotic syndrome, and possible GIB.   # AKI on CKD: In the setting of nephrotic syndrome and dCHF. AKI is most likely caused by intra-renal causes as aggressive diuresis has not improved kidney functions. Renal scan, with a normal-sized kidneys, but noted increased parenchymal echogenicity, consistent with medical renal disease. No evidence of hydronephrosis. Cr decreased to 1.78 from 2.53 2 days ago. Renal scan consistent with medical renal disease. No hydronephrosis. 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 - Nephrology recs appreciated  - Hold off ACEI until AKI resolves  - Hep C RNA follow-up - consider renal artery duplex to rule out RVT  - coagulation panel (nl complement level, ASO negative, anti-phospholipids pending) - Hold coumadin for possible surgery by ortho and renal biospy  - restart heparin this am  - Hemolytic  labs (haptoglobin, LDH) to rule out AIHA  # dCHF: Echo with EF of 55%, and grade 2 diastolic dysfunction on ECHO. He still have excess volume on board and he  is symptomatic with dyspnea and requiring supplementary O2 therapy.  Plan  - off lasix now as recommended by renal  - Verapamil 159m daily  - Coreg 12.5 mg bid mainly for BP control.  - hold off ACEI with AKI  - Will aim for negative or even fluid balance. Still making urine but +4.6L up  - PT recommended CIR consult once patient is ready for discharge.   # Iron-deficiency Anemia: Low normal hemoglobin. Today at 8.3. Hemoglobin of 8.2 in 2015, but 13.9 in 2010. Iron panel shows low iron stores (Iron 19, Ferritin 179), and inappropriately low retic index (0.2). FOBT is positive x2. No colonoscopy on file, but there was 3 negative FOBT in 10/2012. GI recommends air- barium enema and an upper GI series for peptic ulcers as outpatient as he is no a candidate for scoping due to clinical status. Transfused with one 1 unit of PRBCs. AIHA not suspected at this time given normal bilirubin.  Plan  - postransfusion Hbg 7.9 on 2/22. Goal above 8 given his co-morbidities.  - will check again tomorrow a.m and see if he need more transfusion  - Protonix 237mPO qD: upper GI bleed and RUQ pain after eating  - Ferrous sulfate 32566mablet TID   # HTN: still somehow elevated today. Goal pressure for proteinuric CKD is <130/80.  Plan  -Continue with hydralazine to 108m81mD.  - Verapamil 120mg43m - increased Carvedilol to 12.5mg B96m - Isosorbide dinitrate 30 mg daily   # Septic shock likely due to acalculous cholecystitis: Acalculous cholecystitis was found on RUQ ultrasound with gallbladder wall thickening of 1.1cm with chronically elevated alk phos and GGT ( in 2013, in 2015) indicating liver as source of elevation in alk phos. AST and ALT are now normal. TBili and DBili remained normal. 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 CBC is  still showing leukocytosis. Other causes for sepsis that are ruled out are systemic bacteremia (only 1 out of 2 cultures with coagulase negative staph) and osteomyelitis (MRI does not show osteomyelitis). Patient was previously evaluated by surgery team, who recommended a HIDA scan. This did not show any hepatobiliary abnormalities. Outpatient followup for cholecystectomy was recommended once patient is medically stable.   # Diabetic foot ulcer: MRI of L foot did not show osteomyelitis, but did show local cellulitis and possible myositis. Wound debrided and packed to prevent abscess. Wound culture showed some Staph species and no anaerobes. ABI on left shows left pedal area with pressure >200, adequate for digital flow to support healing.  -Cephlaxin should cover staph species in wound, and will continue this regimen until infection has appeared to be resolved. -Wound packing to prevent abscess  -I&D will be performed on 2/23 afternoon  # Acute DVT: Patient was found to have bilateral acute DVT without PE. Therapeutic on heparin with UFH of 0.44 and goal of 0.3-0.7. Supra therapeutic on Warfarin with INR of 3.92. Anticoagulation team following. If patient undergoes surgery, anticoagulation team will be notified to manage anticoagulation. He require anticoagulants for three months for first time symptomatic acute DVT .  - switched to heparin due to possible surgery  - will restart coumadin after procedures   # T2DM: CBG  - Hold Lantus until patient is no longer NPO, consider starting Lantus 10 units with breakfast as glucose during the day is elevated, but fasting glucose is around 120. - SSI  # Seizure: resolved issue. Most likely seizures were induced by acute decompensation prior  to his ICU course. EEG showed medication effect. Off keppra now.   # Hypoalbuminemia:  Patient has albumin of 1.7 with protein gap of 5.3 from total protein. Patient has a baseline hypoalbuminemia. However, UPEP showed  elevated kappa and lambda free light chains, but the kappa/lambda ratio is normal (4). SPEP did not show M spike. We are less concerned for paraprotein phenomenon even though patient has anemia, AKI, and elevated alkaline phosphate. Most likely cause is nephrotic syndrome. Patient does not appear to have liver disease given normal liver appearance on ultrasound and normal AST/ALT, and the acalculous cholecystitis can explain the elevated alk phos level.   Proph:  DVT: already on UFH  Mobility: PT and OT request rehab consult for patient. Patient requires walker   This is a Careers information officer Note.  The care of the patient was discussed with Dr. Linus Salmons and the assessment and plan formulated with their assistance.  Please see their attached note for official documentation of the daily encounter.  Joshua [name] 01/17/2014, 9:17 AM

## 2014-01-17 NOTE — Progress Notes (Signed)
Pt requested that nursing call his eye doctor in [place], [place] to let them know he will not be able to make his appointment today.  Message left with Dr. Kathrynn Ducking office at [phone].

## 2014-01-17 NOTE — Progress Notes (Signed)
I have seen the patient and reviewed the daily progress note by Konrad Saha MS IV and discussed the care of the patient with them.  See below for documentation of my findings, assessment, and plans.  Subjective: Doing well, no complaints, feels hungry, sleeping well, no abdominal pain, has been tolerating diet  Objective: Vital signs in last 24 hours: Filed Vitals:   01/16/14 1723 01/16/14 2049 01/17/14 0500 01/17/14 0908  BP: 152/88 143/90 147/78 163/88  Pulse: 84 75 73 83  Temp:  97.9 F (36.6 C) 97.8 F (36.6 C)   TempSrc:      Resp:  18 16   Height:      Weight:      SpO2:  99% 99%    Weight change:   Intake/Output Summary (Last 24 hours) at 01/17/14 1218 Last data filed at 01/17/14 1000  Gross per 24 hour  Intake   1080 ml  Output   2250 ml  Net  -1170 ml   General: resting in chair, no acute distress HEENT: PERRL, EOMI, no scleral icterus Cardiac: RRR, no rubs, murmurs or gallops Pulm: clear to auscultation bilaterally, moving normal volumes of air Abd: soft, nontender, ?voluntary guarding, nondistended, BS normoactive Ext: warm and well perfused, 1-2+pitting edema, left foot wrapped with serosanguinous discharge visible on dressing of plantar aspect of left foot Neuro: alert and oriented X3, cranial nerves II-XII grossly intact  Lab Results: Reviewed and documented in Electronic Record Micro Results: Reviewed and documented in Electronic Record Studies/Results: Reviewed and documented in Electronic Record  Medications: I have reviewed the patient's current medications. Scheduled Meds: . carvedilol  12.5 mg Oral BID WC  . cephALEXin  500 mg Oral 3 times per day  . chlorhexidine  60 mL Topical Once  . ferrous sulfate  325 mg Oral TID WC  . hydrALAZINE  50 mg Oral 3 times per day  . insulin aspart  0-9 Units Subcutaneous TID WC  . isosorbide mononitrate  30 mg Oral Daily  . pantoprazole  20 mg Oral QAC breakfast  . verapamil  120 mg Oral Daily    Continuous Infusions: . 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 MRI (-) for osteo. -Appreciate ortho recs  -Keflex PO -wound care  #Microcytic Iron def Anemia: s/p transfusion on 01/15/14, Hb subsequently stable, Hb goal > 8  -GI has rec barium enema & upper GI, possibly outpt -PPI, Iron supp (also received iron load with transfusion) -AM CBC   #Acute on chronic diastolic HF: Weights stable ~156/157, no weight today, I/Os; concern for amyloid  -Appreciate HF team recs --> cont coreg 12.5 bid; diuretics on hold, UO 2.5L yesterday, 700cc today so far  #HTN: stable.  -Cont verapamil 120 daily, hydralazine 50 tid, imdur 30 daily  #Acute DVT: Heparin gtt given procedure  #Seizure d/o: Patient has only had one seizure, will plan to taper Keppra  -Keppra has been tapered off   #Acalculous Cholecystitis: Hemodynamically stable, Negative HIDA, Was admitted with sepsis due to this which  has resolved  -Surgery was involved but has signed off, no surgical intervention now (thought would benefit, not a candidate)  -Advance diet - low fat   #DM: HbA1c 6.5 on 11/26/13  -SSI   #VTE ppx: heparin gtt  ---> REMAINDER PER RUI JIANG, MSIV NOTE   Dispo: Disposition is deferred at this time, awaiting improvement of current medical problems.  Anticipated discharge in approximately 2-3 day(s).  Being evaluated for CIR.  The patient does have a current PCP Duwaine Maxin,  DO) and does need an [facility] hospital follow-up appointment after discharge.  The patient does not have transportation limitations that hinder transportation to clinic appointments.  .Services Needed at time of discharge: Y = Yes, Blank = No PT:   OT:   RN:   Equipment:   Other:     LOS: 11 days   Othella Boyer, MD 01/17/2014, 12:18 PM

## 2014-01-17 NOTE — Anesthesia Procedure Notes (Signed)
Procedure Name: LMA Insertion Date/Time: 01/17/2014 7:57 PM Performed by: Valetta Fuller Pre-anesthesia Checklist: Patient identified, Emergency Drugs available, Suction available and Patient being monitored Patient Re-evaluated:Patient Re-evaluated prior to inductionOxygen Delivery Method: Circle system utilized Preoxygenation: Pre-oxygenation with 100% oxygen Intubation Type: IV induction LMA: LMA inserted LMA Size: 4.0 Number of attempts: 1 Placement Confirmation: positive ETCO2 Tube secured with: Tape Dental Injury: Teeth and Oropharynx as per pre-operative assessment

## 2014-01-17 NOTE — Transfer of Care (Signed)
Immediate Anesthesia Transfer of Care Note  Patient: Joshua [name]  Procedure(s) Performed: Procedure(s): IRRIGATION AND DEBRIDEMENT  LEFT FOOT (Left)  Patient Location: PACU  Anesthesia Type:General  Level of Consciousness: awake and sedated  Airway & Oxygen Therapy: Patient Spontanous Breathing and Patient connected to nasal cannula oxygen  Post-op Assessment: Report given to PACU RN and Post -op Vital signs reviewed and stable  Post vital signs: Reviewed and stable  Complications: No apparent anesthesia complications

## 2014-01-17 NOTE — Progress Notes (Signed)
ANTICOAGULATION CONSULT NOTE - Follow Up Consult  Pharmacy Consult for heparin Indication: DVT  No Known Allergies  Patient Measurements: Height: 5\' 6"  (167.6 cm) Weight: 156 lb 15.5 oz (71.2 kg) IBW/kg (Calculated) : 63.8 Heparin Dosing Weight: 71kg  Vital Signs: Temp: 97.8 F (36.6 C) (02/23 0500) BP: 147/78 mmHg (02/23 0500) Pulse Rate: 73 (02/23 0500)  Labs:  Recent Labs  01/14/14 2130 01/15/14 0430 01/15/14 0802 01/16/14 0540 01/16/14 1725 01/17/14 0540  HGB  --  6.7* 7.1* 7.9*  --  8.3*  HCT  --  20.9* 21.1* 24.3*  --  26.1*  PLT  --  450* 418* 427*  --  423*  LABPROT  --  27.9*  --  20.7*  --  20.8*  INR  --  2.72*  --  1.84*  --  1.85*  HEPARINUNFRC  --  0.71*  --   --  0.47 0.73*  CREATININE  --  2.53*  --  2.19*  --  1.78*  CKTOTAL 155  --   --   --   --   --     Estimated Creatinine Clearance: 38.8 ml/min (by C-G formula based on Cr of 1.78).  Assessment: 62 YOM on heparin was bridging to warfarin but with multiple procedures planned for this week this has been placed on hold. INR subtherapeutic and stable from yesterday at 1.8. Heparin level just above goal this morning, noted plans to turn gtt off at 1300 will decrease rate now then follow up post surgery, stop time re-entered and verified with nursing the plan for holding. No bleeding issues noted, hgb up this am, plt stable.  Patient for I&D this afternoon Renal bx planned for later this week  Goal of Therapy:  Heparin level 0.3-0.7 units/ml Monitor platelets by anticoagulation protocol: Yes   Plan:  1. Decrease heparin to 1500 units/hr  2. Daily HL and CBC 3. Follow for s/s bleeding and stop heparin this afternoon at 1300 prior to I&D  Erin Hearing PharmD., BCPS Clinical Pharmacist Pager [phone] 01/17/2014 8:54 AM

## 2014-01-17 NOTE — Progress Notes (Signed)
Pt states his cousin and his wife can provide 24/7 assist. I spoke with his cousin by phone and he is in agreement. I need to clarify handicapped accessibility of home. I will follow up tomorrow. [id number]

## 2014-01-17 NOTE — Anesthesia Preprocedure Evaluation (Signed)
Anesthesia Evaluation  Patient identified by MRN, date of birth, ID band Patient awake    Reviewed: Allergy & Precautions, H&P , NPO status , Patient's Chart, lab work & pertinent test results  Airway Mallampati: II  Neck ROM: full    Dental   Pulmonary shortness of breath, former smoker,  H/o TB s/p treatment         Cardiovascular hypertension, + Peripheral Vascular Disease     Neuro/Psych Seizures -,   Neuromuscular disease    GI/Hepatic   Endo/Other  diabetes, Type 2  Renal/GU Renal InsufficiencyRenal disease     Musculoskeletal   Abdominal   Peds  Hematology   Anesthesia Other Findings   Reproductive/Obstetrics                           Anesthesia Physical Anesthesia Plan  ASA: III  Anesthesia Plan: General   Post-op Pain Management:    Induction: Intravenous  Airway Management Planned: LMA  Additional Equipment:   Intra-op Plan:   Post-operative Plan:   Informed Consent: I have reviewed the patients History and Physical, chart, labs and discussed the procedure including the risks, benefits and alternatives for the proposed anesthesia with the patient or authorized representative who has indicated his/her understanding and acceptance.     Plan Discussed with: CRNA, Anesthesiologist and Surgeon  Anesthesia Plan Comments:         Anesthesia Quick Evaluation

## 2014-01-17 NOTE — Progress Notes (Signed)
ANTICOAGULATION CONSULT NOTE - Follow Up Consult  Pharmacy Consult for heparin Indication: DVT  No Known Allergies  Patient Measurements: Height: 5\' 6"  (167.6 cm) Weight: 156 lb 15.5 oz (71.2 kg) IBW/kg (Calculated) : 63.8 Heparin Dosing Weight: 71kg  Vital Signs: Temp: 97.7 F (36.5 C) (02/23 2138) Temp src: Oral (02/23 1410) BP: 139/78 mmHg (02/23 2130) Pulse Rate: 78 (02/23 2138)  Labs:  Recent Labs  01/15/14 0430 01/15/14 0802 01/16/14 0540 01/16/14 1725 01/17/14 0540  HGB 6.7* 7.1* 7.9*  --  8.3*  HCT 20.9* 21.1* 24.3*  --  26.1*  PLT 450* 418* 427*  --  423*  LABPROT 27.9*  --  20.7*  --  20.8*  INR 2.72*  --  1.84*  --  1.85*  HEPARINUNFRC 0.71*  --   --  0.47 0.73*  CREATININE 2.53*  --  2.19*  --  1.78*    Estimated Creatinine Clearance: 38.8 ml/min (by C-G formula based on Cr of 1.78).  Assessment: 63 yo male with h/o DVT s/p L foot I & D for heparin  Goal of Therapy:  Heparin level 0.3-0.7 units/ml Monitor platelets by anticoagulation protocol: Yes   Plan:  Restart heparin 1500 units/hr at 0300 Check heparin level in 8 hours.  Phillis Knack, PharmD, BCPS  01/17/2014 11:56 PM

## 2014-01-17 NOTE — Progress Notes (Signed)
Subjective: Pt denies any pain in his foot.  No n/v/f/c.  Packing to left foot wound being done intermittently.   Objective: Vital signs in last 24 hours: Temp:  [97.8 F (36.6 C)-98 F (36.7 C)] 97.8 F (36.6 C) 01/18/2023 0500) Pulse Rate:  [73-86] 73 01/18/2023 0500) Resp:  [16-18] 16 01/18/23 0500) BP: (133-167)/(67-95) 147/78 mmHg 01-18-2023 0500) SpO2:  [98 %-99 %] 99 % 01/18/23 0500)  Intake/Output from previous day: 02/22 0701 - 2023-01-18 0700 In: 1680 [P.O.:1680] Out: 2550 [Urine:2550] Intake/Output this shift:     Recent Labs  01/14/14 1325 01/15/14 0430 01/15/14 0802 01/16/14 0540 01/18/2014 0540  HGB 8.1* 6.7* 7.1* 7.9* 8.3*    Recent Labs  01/16/14 0540 01-18-14 0540  WBC 15.8* 16.2*  RBC 3.29* 3.47*  HCT 24.3* 26.1*  PLT 427* 423*    Recent Labs  01/16/14 0540 01-18-2014 0540  NA 138 139  K 4.3 4.8  CL 101 102  CO2 26 27  BUN 42* 41*  CREATININE 2.19* 1.78*  GLUCOSE 141* 179*  CALCIUM 8.7 8.5    Recent Labs  01/16/14 0540 January 18, 2014 0540  INR 1.84* 1.85*    PE:  left foot plantar wound with abundant purulent drainage.  Fluctuance dorsal to hallux MPJ and first web space.  erythema dorsally has improved.  NV exam unchanged.  Assessment/Plan: L foot plantar diabetic ulcer and abscess - to OR this afternoon for I and D.  I've counseled the patient that his foot is at risk for amputation given the location of the abscess.  His most recent MRI showed no evidence of osteo, but packing the wound has not been effective.  He has developed an abscess that requires surgical drainage.  The risks and benefits of the alternative treatment options have been discussed in detail.  The patient wishes to proceed with surgery and specifically understands risks of bleeding, infection, nerve damage, blood clots, need for additional surgery, amputation and death.    Wylene Simmer January 18, 2014, 7:48 AM

## 2014-01-17 NOTE — Anesthesia Postprocedure Evaluation (Signed)
Anesthesia Post Note  Patient: Joshua [name]  Procedure(s) Performed: Procedure(s) (LRB): IRRIGATION AND DEBRIDEMENT  LEFT FOOT (Left)  Anesthesia type: General  Patient location: PACU  Post pain: Pain level controlled and Adequate analgesia  Post assessment: Post-op Vital signs reviewed, Patient's Cardiovascular Status Stable, Respiratory Function Stable, Patent Airway and Pain level controlled  Last Vitals:  Filed Vitals:   01/17/14 2138  BP:   Pulse: 78  Temp: 36.5 C  Resp: 15    Post vital signs: Reviewed and stable  Level of consciousness: awake, alert  and oriented  Complications: No apparent anesthesia complications

## 2014-01-17 NOTE — Addendum Note (Signed)
Addended by: Hulan Fray on: 01/17/2014 03:11 PM   Modules accepted: Orders

## 2014-01-17 NOTE — Progress Notes (Signed)
[facility] ROUNDING NOTE   Subjective:   Interval History: hungry  Awaiting I and D today and NPO   Objective:  Vital signs in last 24 hours:  Temp:  [97.8 F (36.6 C)-98 F (36.7 C)] 97.8 F (36.6 C) (02/23 0500) Pulse Rate:  [73-84] 83 (02/23 0908) Resp:  [16-18] 16 (02/23 0500) BP: (133-163)/(67-90) 163/88 mmHg (02/23 0908) SpO2:  [98 %-99 %] 99 % (02/23 0500)  Weight change:  Filed Weights   01/14/14 0900 01/15/14 0700 01/16/14 0524  Weight: 71.351 kg (157 lb 4.8 oz) 71.442 kg (157 lb 8 oz) 71.2 kg (156 lb 15.5 oz)    Intake/Output: I/O last 3 completed shifts: In: 2277.5 [P.O.:1680; I.V.:250; Blood:12.5; Other:335] Out: [id number] [Urine:2550]   Intake/Output this shift:  Total I/O In: -  Out: 700 [Urine:700]  General appearance: alert, cooperative and no distress  Lungs: clear to auscultation bilaterally  Heart: regular rate and rhythm, S1, S2 normal, no murmur, click, rub or gallop  Abdomen: Soft, non tender, slightly distended, active bowel sounds  Extremities: 1+ pitting edema, packed ulcer on L plantar surface below the large toe  Pulses: 2+ and symmetric    Basic Metabolic Panel:  Recent Labs Lab 01/13/14 0413 01/14/14 0415 01/15/14 0430 01/16/14 0540 01/17/14 0540  NA 141 141 140 138 139  K 3.6* 3.9 4.4 4.3 4.8  CL 104 103 101 101 102  CO2 24 25 26 26 27   GLUCOSE 131* 134* 156* 141* 179*  BUN 30* 31* 39* 42* 41*  CREATININE 1.99* 2.28* 2.53* 2.19* 1.78*  CALCIUM 8.2* 8.4 8.4 8.7 8.5  PHOS  --   --  4.7*  --   --     Liver Function Tests:  Recent Labs Lab 01/11/14 1115 01/15/14 0430  AST 36  --   ALT 66*  --   ALKPHOS 236*  --   BILITOT 0.4  --   PROT 7.3  --   ALBUMIN 1.6* 1.5*   No results found for this basename: LIPASE, AMYLASE,  in the last 168 hours No results found for this basename: AMMONIA,  in the last 168 hours  CBC:  Recent Labs Lab 01/14/14 1325 01/15/14 0430 01/15/14 0802 01/16/14 0540  01/17/14 0540  WBC 17.5* 15.3* 15.1* 15.8* 16.2*  HGB 8.1* 6.7* 7.1* 7.9* 8.3*  HCT 25.0* 20.9* 21.1* 24.3* 26.1*  MCV 72.5* 71.8* 71.8* 73.9* 75.2*  PLT 498* 450* 418* 427* 423*    Cardiac Enzymes:  Recent Labs Lab 01/14/14 2130  CKTOTAL 155    BNP: No components found with this basename: POCBNP,   CBG:  Recent Labs Lab 01/15/14 2119 01/16/14 0742 01/16/14 1142 01/16/14 2035 01/17/14 0734  GLUCAP 167* 119* 211* 196* 155*    Microbiology: Results for orders placed during the hospital encounter of 01/06/14  CULTURE, BLOOD (ROUTINE X 2)     Status: None   Collection Time    01/07/14  8:15 AM      Result Value Ref Range Status   Specimen Description BLOOD LEFT ARM   Final   Special Requests BOTTLES DRAWN AEROBIC AND ANAEROBIC 10CC   Final   Culture  Setup Time     Final   Value: 01/07/2014 14:02     Performed at [facility]   Culture     Final   Value: STAPHYLOCOCCUS SPECIES (COAGULASE NEGATIVE)     Note: THE SIGNIFICANCE OF ISOLATING THIS ORGANISM FROM A SINGLE SET OF BLOOD CULTURES WHEN MULTIPLE SETS  ARE DRAWN IS UNCERTAIN. PLEASE NOTIFY THE MICROBIOLOGY DEPARTMENT WITHIN ONE WEEK IF SPECIATION AND SENSITIVITIES ARE REQUIRED.     Note: Gram Stain Report Called to,Read Back By and Verified With: [facility] 01/08/14 @ 5:56PM BY RUSCOE A.     Performed at [facility]   Report Status 01/09/2014 FINAL   Final  CULTURE, BLOOD (ROUTINE X 2)     Status: None   Collection Time    01/07/14  8:30 AM      Result Value Ref Range Status   Specimen Description BLOOD LEFT ARM   Final   Special Requests BOTTLES DRAWN AEROBIC AND ANAEROBIC 10CC   Final   Culture  Setup Time     Final   Value: 01/07/2014 14:02     Performed at [facility]   Culture     Final   Value: NO GROWTH 5 DAYS     Performed at [facility]   Report Status 01/13/2014 FINAL   Final  ANAEROBIC CULTURE     Status: None   Collection Time    01/11/14  8:31 AM       Result Value Ref Range Status   Specimen Description WOUND FOOT LEFT   Final   Special Requests     Final   Value: LEFT PLANTAR ULCER PT ON ZOSYN AND ZYVOX AND AUGMENTIN   Gram Stain     Final   Value: NO WBC SEEN     NO SQUAMOUS EPITHELIAL CELLS SEEN     NO ORGANISMS SEEN     Performed at [facility]   Culture     Final   Value: NO ANAEROBES ISOLATED     Performed at [facility]   Report Status 01/16/2014 FINAL   Final  WOUND CULTURE     Status: None   Collection Time    01/11/14  8:31 AM      Result Value Ref Range Status   Specimen Description WOUND FOOT LEFT   Final   Special Requests LEFT PLANTAR ULCER PT ON ZYVOX,ZOSYN AND AUGMENTIN   Final   Gram Stain     Final   Value: NO WBC SEEN     NO SQUAMOUS EPITHELIAL CELLS SEEN     NO ORGANISMS SEEN     Performed at [facility]   Culture     Final   Value: FEW STAPHYLOCOCCUS AUREUS     Note: RIFAMPIN AND GENTAMICIN SHOULD NOT BE USED AS SINGLE DRUGS FOR TREATMENT OF STAPH INFECTIONS. This organism DOES NOT demonstrate inducible Clindamycin resistance in vitro.     Performed at [facility]   Report Status 01/14/2014 FINAL   Final   Organism ID, Bacteria STAPHYLOCOCCUS AUREUS   Final    Coagulation Studies:  Recent Labs  01/15/14 0430 01/16/14 0540 01/17/14 0540  LABPROT 27.9* 20.7* 20.8*  INR 2.72* 1.84* 1.85*    Urinalysis:  Recent Labs  01/14/14 1630  COLORURINE YELLOW  LABSPEC 1.016  PHURINE 5.0  GLUCOSEU 100*  HGBUR NEGATIVE  BILIRUBINUR NEGATIVE  KETONESUR NEGATIVE  PROTEINUR 100*  UROBILINOGEN 0.2  NITRITE NEGATIVE  LEUKOCYTESUR NEGATIVE      Imaging: No results found.   Medications:   . heparin 1,500 Units/hr (01/17/14 0945)   . carvedilol  12.5 mg Oral BID WC  . cephALEXin  500 mg Oral 3 times per day  . chlorhexidine  60 mL Topical Once  . ferrous sulfate  325 mg Oral  TID WC  . hydrALAZINE  50 mg Oral 3 times per day  . insulin aspart  0-9 Units  Subcutaneous TID WC  . isosorbide mononitrate  30 mg Oral Daily  . pantoprazole  20 mg Oral QAC breakfast  . potassium chloride  20 mEq Oral BID  . verapamil  120 mg Oral Daily   sodium chloride  Assessment/ Plan:  Pt is a very pleasant 63yo [place] male who was admitted on 01/06/14 with diabetic foot infection and started empirically on Abx. He has developed worsening lower ext edema and the heart failure team was consulted and ordered IV diuretics, his Scr has risen since admissionHe was also recently diagnosed with acute DVT of both legs by duplex and is currently on coumadin.  nephrotic range proteinuria dating back to at least 11/13 . He was found to have hepatitis C positive serologies    Nephrotic range proteinuria. 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 Renal ultrasound no hydronephrosis  Acute renal failure creatinine improving  There appears to be no indications for a renal biopsy. Some serological tests are pending    LOS: 11 Addisson Frate W @TODAY @11 :28 AM

## 2014-01-18 ENCOUNTER — Encounter (HOSPITAL_COMMUNITY): Payer: Self-pay | Admitting: Orthopedic Surgery

## 2014-01-18 DIAGNOSIS — B958 Unspecified staphylococcus as the cause of diseases classified elsewhere: Secondary | ICD-10-CM

## 2014-01-18 LAB — CBC
HEMATOCRIT: 25.6 % — AB (ref 39.0–52.0)
Hemoglobin: 8.2 g/dL — ABNORMAL LOW (ref 13.0–17.0)
MCH: 24.6 pg — AB (ref 26.0–34.0)
MCHC: 32 g/dL (ref 30.0–36.0)
MCV: 76.6 fL — AB (ref 78.0–100.0)
Platelets: 434 10*3/uL — ABNORMAL HIGH (ref 150–400)
RBC: 3.34 MIL/uL — AB (ref 4.22–5.81)
RDW: 19.1 % — ABNORMAL HIGH (ref 11.5–15.5)
WBC: 12.8 10*3/uL — ABNORMAL HIGH (ref 4.0–10.5)

## 2014-01-18 LAB — BASIC METABOLIC PANEL
BUN: 37 mg/dL — AB (ref 6–23)
CALCIUM: 8.6 mg/dL (ref 8.4–10.5)
CO2: 27 mEq/L (ref 19–32)
Chloride: 103 mEq/L (ref 96–112)
Creatinine, Ser: 1.74 mg/dL — ABNORMAL HIGH (ref 0.50–1.35)
GFR calc Af Amer: 47 mL/min — ABNORMAL LOW (ref >90)
GFR, EST NON AFRICAN AMERICAN: 40 mL/min — AB (ref >90)
Glucose, Bld: 183 mg/dL — ABNORMAL HIGH (ref 70–99)
Potassium: 4.6 mEq/L (ref 3.7–5.3)
Sodium: 140 mEq/L (ref 137–147)

## 2014-01-18 LAB — HCV RNA QUANT: HCV QUANT: NOT DETECTED [IU]/mL — AB (ref <15)

## 2014-01-18 LAB — PROTEIN S ACTIVITY: Protein S Activity: 37 % — ABNORMAL LOW (ref 69–129)

## 2014-01-18 LAB — HEPARIN LEVEL (UNFRACTIONATED): Heparin Unfractionated: 0.44 IU/mL (ref 0.30–0.70)

## 2014-01-18 LAB — GLUCOSE, CAPILLARY
GLUCOSE-CAPILLARY: 162 mg/dL — AB (ref 70–99)
Glucose-Capillary: 165 mg/dL — ABNORMAL HIGH (ref 70–99)
Glucose-Capillary: 160 mg/dL — ABNORMAL HIGH (ref 70–99)
Glucose-Capillary: 190 mg/dL — ABNORMAL HIGH (ref 70–99)

## 2014-01-18 LAB — PROTIME-INR
INR: 1.84 — ABNORMAL HIGH (ref 0.00–1.49)
Prothrombin Time: 20.7 seconds — ABNORMAL HIGH (ref 11.6–15.2)

## 2014-01-18 LAB — PROTEIN C ACTIVITY: Protein C Activity: 18 % — ABNORMAL LOW (ref 75–133)

## 2014-01-18 MED ORDER — WARFARIN - PHARMACIST DOSING INPATIENT: Freq: Every day | Status: DC

## 2014-01-18 MED ORDER — WARFARIN SODIUM 5 MG PO TABS
5.0000 mg | ORAL_TABLET | Freq: Once | ORAL | Status: AC
  Administered 2014-01-18: 5 mg via ORAL
  Filled 2014-01-18: qty 1

## 2014-01-18 NOTE — Progress Notes (Signed)
Patient seen and examined with MS4.  Late entry.   Awake, alert,in chair CTA B RRR Abd: soft, nt, nd  A/P: surgery, optimize, rehab.

## 2014-01-18 NOTE — Progress Notes (Signed)
PT Cancellation Note  Patient Details Name: Joshua [name] MRN: [id number] DOB: 03-03-1951   Cancelled Treatment:    Reason Eval/Treat Not Completed: Other (comment) (pt working with OT.  PT to check back soon.  )   Wells Guiles B. [place], [place], DPT [phone]   01/18/2014, 11:32 AM

## 2014-01-18 NOTE — Progress Notes (Signed)
Subjective: No overnight events. He feels better.   Objective: Vital signs in last 24 hours: Filed Vitals:   01/17/14 2115 01/17/14 2130 01/17/14 2138 01/18/14 0608  BP: 122/75 139/78  143/77  Pulse: 79 79 78 88  Temp:   97.7 F (36.5 C) 98 F (36.7 C)  TempSrc:    Oral  Resp: '14 14 15 18  ' Height:      Weight:    159 lb 8 oz (72.349 kg)  SpO2: 92% 94% 94% 100%   Weight change:   Intake/Output Summary (Last 24 hours) at 01/18/14 1053 Last data filed at 01/18/14 0948  Gross per 24 hour  Intake    960 ml  Output    900 ml  Net     60 ml   Physical Exam: BP 143/77  Pulse 88  Temp(Src) 98 F (36.7 C) (Oral)  Resp 18  Ht '5\' 6"'  (1.676 m)  Wt 159 lb 8 oz (72.349 kg)  BMI 25.76 kg/m2  SpO2 100% General appearance: alert, cooperative and no distress, still on O2 supplementation Neck: JVD - 6 cm above sternal notch Lungs: Diminished RLL sounds, no egophony, no tactile fermitus, tympanic to percussion Heart: Faint to none S4 or S1 split, regular rate, no murmur Abdomen: soft, non-tender; bowel sounds normal; no masses,  no organomegaly Extremities: 2+ pedal edema, post-op bandage clean and dry. No active bleeding. Pulses: 2+ and symmetric Lab Results: BMET    Component Value Date/Time   NA 140 01/18/2014 0356   K 4.6 01/18/2014 0356   CL 103 01/18/2014 0356   CO2 27 01/18/2014 0356   GLUCOSE 183* 01/18/2014 0356   BUN 37* 01/18/2014 0356   CREATININE 1.74* 01/18/2014 0356   CREATININE 1.14 01/05/2014 1616   CALCIUM 8.6 01/18/2014 0356   GFRNONAA 40* 01/18/2014 0356   GFRAA 47* 01/18/2014 0356    CBC    Component Value Date/Time   WBC 12.8* 01/18/2014 0356   RBC 3.34* 01/18/2014 0356   RBC 2.98* 01/12/2014 2308   HGB 8.2* 01/18/2014 0356   HCT 25.6* 01/18/2014 0356   PLT 434* 01/18/2014 0356   MCV 76.6* 01/18/2014 0356   MCH 24.6* 01/18/2014 0356   MCHC 32.0 01/18/2014 0356   RDW 19.1* 01/18/2014 0356   LYMPHSABS 1.5 01/05/2014 1616   MONOABS 1.5* 01/05/2014 1616   EOSABS 0.1  01/05/2014 1616   BASOSABS 0.0 01/05/2014 1616     Micro Results: Recent Results (from the past 240 hour(s))  ANAEROBIC CULTURE     Status: None   Collection Time    01/11/14  8:31 AM      Result Value Ref Range Status   Specimen Description WOUND FOOT LEFT   Final   Special Requests     Final   Value: LEFT PLANTAR ULCER PT ON ZOSYN AND ZYVOX AND AUGMENTIN   Gram Stain     Final   Value: NO WBC SEEN     NO SQUAMOUS EPITHELIAL CELLS SEEN     NO ORGANISMS SEEN     Performed at [facility]   Culture     Final   Value: NO ANAEROBES ISOLATED     Performed at [facility]   Report Status 01/16/2014 FINAL   Final  WOUND CULTURE     Status: None   Collection Time    01/11/14  8:31 AM      Result Value Ref Range Status   Specimen Description WOUND FOOT LEFT  Final   Special Requests LEFT PLANTAR ULCER PT ON ZYVOX,ZOSYN AND AUGMENTIN   Final   Gram Stain     Final   Value: NO WBC SEEN     NO SQUAMOUS EPITHELIAL CELLS SEEN     NO ORGANISMS SEEN     Performed at [facility]   Culture     Final   Value: FEW STAPHYLOCOCCUS AUREUS     Note: RIFAMPIN AND GENTAMICIN SHOULD NOT BE USED AS SINGLE DRUGS FOR TREATMENT OF STAPH INFECTIONS. This organism DOES NOT demonstrate inducible Clindamycin resistance in vitro.     Performed at [facility]   Report Status 01/14/2014 FINAL   Final   Organism ID, Bacteria STAPHYLOCOCCUS AUREUS   Final  CULTURE, ROUTINE-ABSCESS     Status: None   Collection Time    01/17/14  8:17 PM      Result Value Ref Range Status   Specimen Description ABSCESS FOOT LEFT   Final   Special Requests PATIENT ON FOLLOWING KEFZOL   Final   Gram Stain     Final   Value: ABUNDANT WBC PRESENT, PREDOMINANTLY PMN     NO SQUAMOUS EPITHELIAL CELLS SEEN     NO ORGANISMS SEEN     Performed at [facility]   Culture     Final   Value: NO GROWTH     Performed at [facility]   Report Status PENDING   Incomplete  ANAEROBIC  CULTURE     Status: None   Collection Time    01/17/14  8:17 PM      Result Value Ref Range Status   Specimen Description ABSCESS FOOT LEFT   Final   Special Requests PATIENT ON FOLLOWING KEFZOL   Final   Gram Stain     Final   Value: ABUNDANT WBC PRESENT, PREDOMINANTLY PMN     NO SQUAMOUS EPITHELIAL CELLS SEEN     NO ORGANISMS SEEN     Performed at [facility]   Culture PENDING   Incomplete   Report Status PENDING   Incomplete   Studies/Results: Left ABI: Calcified arterial pressures in left pedal area with pressures > 200. Triphasic pedal waveforms X 3 in foot with great toe pressure 163mHg. Brachial BP 1662mg. Purely from a vascular Standpoint the great toe pressure suggest adequate digital flow to support healing.   Medications: I have reviewed the patient's current medications. Scheduled Meds: . carvedilol  12.5 mg Oral BID WC  . cephALEXin  500 mg Oral 3 times per day  . ferrous sulfate  325 mg Oral TID WC  . hydrALAZINE  50 mg Oral 3 times per day  . insulin aspart  0-9 Units Subcutaneous TID WC  . isosorbide mononitrate  30 mg Oral Daily  . pantoprazole  20 mg Oral QAC breakfast  . verapamil  120 mg Oral Daily   Continuous Infusions: . heparin 1,500 Units/hr (01/18/14 0328)   PRN Meds:.HYDROcodone-acetaminophen, HYDROmorphone (DILAUDID) injection, morphine injection, ondansetron (ZOFRAN) IV, sodium chloride Assessment/Plan: Principal Problem:   Acute on chronic diastolic heart failure Active Problems:   Hypertension   Diabetes mellitus with renal manifestation   Hypoalbuminemia   Diabetic nephropathy with proteinuria   Cellulitis   Acute respiratory failure   New onset seizure   Acute renal failure   Acute acalculous cholecystitis   Elevated alkaline phosphatase level   Microcytic anemia   DVT of popliteal vein   Possible GI bleeding   Nephrotic syndrome  Anasarca   LOS: 12 days   ANTIBIOTICS summary:  Vanc 2/12 >>> 2/13  Linezolid 2/13 >>>  2/16  Pip-tazo 2/13 >>> 2/16  Augmentin 2/16 >>> 2/17  Unasyn 2/17 >>> 2/19  Zosyn 2/19 >>> 2/20 Cephalexin 2/20 >>>   Mr. Purohit is a 63yo man with history of T2DM, HTN, L foot ulcer, previously treated extrapulmonary TB who has returned to IMTS from ICU with sepsis possibly due to acalculous cholecystitis, but patient is afebrile and most active issues include dCHF, AKI on CKD with nephrotic syndrome, and possible GIB.   # dCHF: Improving volume status but still dyspneic and on O2 [place]. Echo with EF of 55%, and grade 2 diastolic dysfunction on ECHO.  Plan  - ambulate when able to determine o2 needs if at all - off lasix - self diurezing  - cont with Verapamil to 156m daily  - cont with Coreg 12.5 mg bid mainly for BP control.  - hold off ACEI with AKI  - monitor ins and outs - discharge pending oxygen need eval and placement issues - discussed with CIR team about SNF Vs CIR. They will evaluate him today  # Iron-deficiency Anemia: Stable. Low normal hemoglobin. Iron panel shows low iron stores (Iron 19, Ferritin 179), and inappropriately low retic index (0.2). FOBT was found to be positive x2. No history of colonoscopy.  Consulted GI who recommends air- barium enema and an upper GI series as outpatient.  Plan  - stable Hgb. Goal 8.0 -Protonix 262mPO qD -Ferrous sulfate 32525mablet TID   # AKI on CKD: Improving. In the setting of nephrotic syndrome and dCHF.  AKI likely caused by intra-renal causes as aggressive diuresis has not improved kidney functions. Renal scan, with a normal-sized kidneys, but noted increased parenchymal echogenicity, consistent with medical renal disease. No evidence of hydronephrosis.  Plan - SCr improving steadily  - Nephrology sign off today with recommendation to check renal function as outpatient in a few days after discharge - Hold off ACEI until AKI resolves   # HTN: stable. Goal pressure for proteinuric CKD is <130/80.  Plan -Continue with  hydralazine to 58m5mD.  - Verapamil 120mg83m - cont with Carvedilol to 12.5mg B65m - Imdur 30 mg daily  # Septic shock likely due to acalculous cholecystitis: Stable. Acalculous cholecystitis was found on RUQ ultrasound with gallbladder wall thickening of 1.1cm with chronically elevated alk phos and GGT ( in 2013, in 2015) indicating liver as source of elevation in alk phos. AST and ALT are now normal. TBili and DBili remained normal. 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 HIDA scan did not show any hepatobiliary abnormalities. Outpatient followup for cholecystectomy was recommended once patient is medically stable.  # Diabetic of Left foot ulcer with Cellulitis: S/p irrigation and debridement 01/17/2014. MRI of L foot did not show osteomyelitis. Wound culture showed some Staph species and no anaerobes. ABI on left shows left pedal area with pressure >200, adequate for digital flow to support healing.  Plan  -Cephlaxin for 3 more days  -Ortho following  - will restart coumadin per pharmacy today  # Acute DVT: Patient was found to have bilateral acute DVT without PE. Therapeutic on heparin with UFH of 0.44 and goal of 0.3-0.7. Supra therapeutic on Warfarin with INR of 3.92. Anticoagulation team following. If patient undergoes surgery, anticoagulation team will be notified to manage anticoagulation. He require anticoagulants for three months for first time symptomatic acute DVT .  -  switched to heparin due to possible surgery  -  restart coumadin - post op 2/23.  # Seizure: resolved. Most likely seizures were induced by acute decompensation prior to his ICU course. EEG showed medication effect. Off keppra.   # Hypoalbuminemia:  Patient has albumin of 1.7 with protein gap of 5.3 from total protein. Patient has a baseline hypoalbuminemia. However, UPEP showed elevated kappa and lambda free light chains, but the kappa/lambda ratio is normal (4). We are less concerned for paraprotein  phenomenon even though patient has anemia, AKI, and elevated alkaline phosphate. Most likely cause is nephrotic syndrome. Patient does not appear to have liver disease given normal liver appearance on ultrasound and normal AST/ALT, and the acalculous cholecystitis can explain the elevated alk phos level.  -SPEP follow-up   Proph:  DVT: cont with heparin and coumadin as above   Mobility: ambulate patient today with pulsioxometer  Jessee Avers, MD PGY-2 Internal Medicine Teaching Service Pager: [phone]

## 2014-01-18 NOTE — Progress Notes (Signed)
CSW is continuing to follow.   Rhea Pink, MSW, [place]

## 2014-01-18 NOTE — Progress Notes (Signed)
Ace wrapped dressing to (l) foot with moderated amount bloody drainage noted. Ace wrap removed and kerlix dressing underneath removed which was saturated with bloody drainage as well. Reinforced foot with 4x4's and new kerlix gauze. Primary MD, orthopedic PA ,  and pharmacy made aware.

## 2014-01-18 NOTE — Progress Notes (Addendum)
I am to meet with pt and his cousin upon arrival in one hr to discuss rehab venue options. Konrad Saha was notified and aware from Attending service of possibility of inpt rehab admit today. [phone]  I met with pt, his cousin and cousin's wife at 44. We discussed rehab venue options of inpt rehab vs SNF. Cousin to go to pt's home tonight to retrieve his wallet so that we can see insurance card. Pt likely has insurance for he just had eye surgery at [facility] January 2015. If it is found that he does not have insurance, they prefer SNF rehab due to cost rather than inpt rehab. Cousin to follow up with me in the morning.[phone]

## 2014-01-18 NOTE — Progress Notes (Signed)
Heparin drip stopped at 1945 per MD's orders. Lt foot dressing applied by previous nurse still clean dry and intact. Foot wrapped with ace dressing per orders. Pt tolerated well with no other complaints. Will continue to monitor patient.

## 2014-01-18 NOTE — Discharge Summary (Signed)
Internal [facility] Discharge Note  Name: Joshua [name] MRN: [id number] DOB: [birth year] 63 y.o.  Date of Admission: 01/06/2014 10:49 PM Date of Discharge: 01/21/2014 Attending Physician: Thayer Headings, MD  Discharge Diagnosis: Principal Problem:   Acute on chronic diastolic heart failure Active Problems:   Hypertension   Diabetes mellitus with renal manifestation   Hypoalbuminemia   Diabetic nephropathy with proteinuria   Cellulitis   Acute respiratory failure   New onset seizure   Acute renal failure   Acute acalculous cholecystitis   Elevated alkaline phosphatase level   Microcytic anemia   DVT of popliteal vein   Possible GI bleeding   Nephrotic syndrome   Anasarca   Discharge Medications:   Medication List    STOP taking these medications       hydrochlorothiazide 12.5 MG capsule  Commonly known as:  MICROZIDE     lisinopril 40 MG tablet  Commonly known as:  PRINIVIL,ZESTRIL     metFORMIN 500 MG tablet  Commonly known as:  GLUCOPHAGE      TAKE these medications       carvedilol 12.5 MG tablet  Commonly known as:  COREG  Take 1 tablet (12.5 mg total) by mouth 2 (two) times daily with a meal.     cephALEXin 500 MG capsule  Commonly known as:  KEFLEX  Take 1 capsule (500 mg total) by mouth every 8 (eight) hours.     ferrous sulfate 325 (65 FE) MG tablet  Take 1 tablet (325 mg total) by mouth 3 (three) times daily with meals.     furosemide 40 MG tablet  Commonly known as:  LASIX  Take 1 tablet (40 mg total) by mouth every other day.     glucose monitoring kit monitoring kit  1 each by Does not apply route as needed for other.     hydrALAZINE 50 MG tablet  Commonly known as:  APRESOLINE  Take 1 tablet (50 mg total) by mouth every 8 (eight) hours.     insulin aspart 100 UNIT/ML injection  Commonly known as:  novoLOG  Inject 0-9 Units into the skin 3 (three) times daily with meals.     isosorbide mononitrate 30 MG 24 hr  tablet  Commonly known as:  IMDUR  Take 1 tablet (30 mg total) by mouth daily.     pantoprazole 20 MG tablet  Commonly known as:  PROTONIX  Take 1 tablet (20 mg total) by mouth daily before breakfast.     verapamil 120 MG CR tablet  Commonly known as:  CALAN-SR  Take 1.5 tablets (180 mg total) by mouth daily.     warfarin 7.5 MG tablet  Commonly known as:  COUMADIN  Take 1 tablet (7.5 mg total) by mouth as directed. Take 7.5 mg and 5 mg on alternating days. Take 7.5 mg today 01/21/2014 and take 59m tomorrow 01/22/2014.        Disposition and follow-up:   [name] [place]  At the hospital follow up visit please address   1. Please check renal function with 3-5 days. His creatinine has been improving progressively. We held his ACEi 2. Consider referral to nephrology as outpatient 3. Please evaluate [facility] renal function allows 4. Assess need for further supplementary oxygen. He was discharged on 2-3L/min.  5. Please check CBC for hemoglobin level with in a week. He was  running at ~7-8. 6. Pleas check INR and adjust his coumadin dose as needed. He was discharged  oral coumadin as treatment for LE DVTs at 7.5 mg and 5 mg on alternating days. 7. Please arrange for GI outpatient follow up for both possible GI bleed and chronic acalculus cholecystitis  8. Please encourage patient to follow up with orthopedic for left planta ulcer s/p irrigation and debridement    Follow-up Appointments: Follow-up Information   Follow up with Vertell Novak, MD On 02/02/2014. (hospital follow up at 10:45am )    Specialty:  Internal Medicine   Contact information:   [place] [address] [phone]       Follow up with Wonda Horner, MD On 02/21/2014. (Appointment at 2:00pm)    Specialty:  Gastroenterology   Contact information:   [address], [place]  [address] [phone]       Follow up with Wylene Simmer, MD. Schedule an appointment as soon as possible for a visit in 2 weeks. (For wound re-check)    Specialty:  Orthopedic Surgery   Contact information:   [address] [place] [address] [phone]      Discharge Orders   Future Appointments Provider Department Dept Phone   02/02/2014 10:45 AM Olga Millers, MD [place] [phone]   Future Orders Complete By Expires   Diet - low sodium heart healthy  As directed    For home use only DME oxygen  As directed    Questions:     Mode or (Route):  Nasal cannula   Liters per Minute:  3   Frequency:  Continuous (stationary and portable oxygen unit needed)   Oxygen conserving device:     Increase activity slowly  As directed       Consultations: Treatment Team:  Wonda Horner, MD Donetta Potts, MD  Procedures Performed:  Dg Chest 2 View  01/05/2014   CLINICAL DATA:  Reflux and chest congestion after eating.  EXAM: CHEST  2 VIEW  COMPARISON:  03/30/2013  FINDINGS: There is volume loss at the right lung base compared to the previous examination. Difficult to exclude bronchiectasis at the right lung base. There is no evidence for airspace disease within the right lung. Prominent right hilar structures are probably related to vascular crowding but this has clearly progressed since 08/31/2010. There are chronic pleural-based densities at the left lung base which could be related to volume loss and pleural thickening. Upper lungs are clear. Heart size is stable and within normal limits. The trachea is midline.  IMPRESSION: Chronic left basilar densities are suggestive for pleural-based disease. Findings could be related to a loculated effusion or pleural thickening.  Volume loss and probable vascular crowding at the right lung base. Recommend follow-up to ensure improved aeration at the right lung base and follow the  right hilar densities. Alternately, consider further evaluation with a chest CT which could also evaluate the left pleural-based disease.   Electronically Signed   By: Markus Daft M.D.   On: 01/05/2014 17:41   Ct Head Wo Contrast  01/07/2014   CLINICAL DATA:  Seizure with altered mental status.  EXAM: CT HEAD WITHOUT CONTRAST  TECHNIQUE: Contiguous axial images were obtained from the base of the skull through the vertex without contrast.  COMPARISON:  None  FINDINGS: Mildly premature cerebral and cerebellar atrophy. Hypoattenuation white matter, likely chronic microvascular ischemic change. No acute stroke or hemorrhage. No  visible mass lesion or hydrocephalus. No extra-axial fluid. Calvarium intact. Minimal layering fluid in the sphenoid. No acute middle ear or mastoid fluid. Negative orbits.  IMPRESSION: Premature atrophy. Suspected chronic microvascular ischemic change without large vessel infarct. No acute intracranial findings.   Electronically Signed   By: Rolla Flatten M.D.   On: 01/07/2014 20:35   Ct Chest Wo Contrast  01/07/2014   CLINICAL DATA:  Respiratory distress requiring intubation  EXAM: CT CHEST WITHOUT CONTRAST  TECHNIQUE: Multidetector CT imaging of the chest was performed following the standard protocol without IV contrast.  COMPARISON:  Chest x-ray earlier today  FINDINGS: Mediastinum: The patient is intubated. The ET tube terminates well above the carina. Hyperdense thyroid gland is nonspecific. Evaluation of the mediastinum for lymphadenopathy is limited given respiratory motion and absence of intravenous contrast material. There appear to be some a high left paratracheal borderline enlarged lymph nodes measuring up to 1 cm in short axis. Prevascular nodal tissue also measures 1 cm in short axis. Low right paratracheal node measures 1 cm in short axis. Incompletely imaged nasogastric tube traverses the thoracic esophagus.  Heart/Vascular: Limited evaluation in the absence of IV contrast.  Cardiomegaly with left and right heart enlargement. No pericardial effusion. No definite aortic aneurysmal dilatation. Well-positioned right subclavian central venous catheter. Catheter tip is at the superior cavoatrial junction.  Lungs/Pleura: Moderate left larger than right pleural effusions with associated bibasilar atelectasis. The aerated portions of the upper lungs demonstrate no significant focal airspace consolidation or suspicious pulmonary nodule. Mild interlobular septal thickening consistent with a mild interstitial edema. Respiratory motion limits evaluation for small pulmonary nodules. Secretions are noted within the trachea and there is a cut off of the right lower lobe bronchus.  Bones/Soft Tissues: No acute fracture or aggressive appearing lytic or blastic osseous lesion.  Upper Abdomen:  Unremarkable limited imaging of the upper abdomen.  IMPRESSION: 1. Moderate left larger than right bilateral pleural effusions with associated bibasilar atelectasis. 2. Mild interstitial pulmonary edema. In the setting of cardiomegaly, this is favored to reflect CHF. 3. Frothy secretions noted in the trachea distal to the endotracheal tube and within the right lower lobe bronchus resulting in a bronchial cut off sign. Strictly speaking, an endobronchial lesion cannot be completely excluded radiographically. 4. Nonspecific mediastinal adenopathy may be reactive, or related to chronic CHF. 5. Satisfactory position of support apparatus as above.   Electronically Signed   By: Jacqulynn Cadet M.D.   On: 01/07/2014 20:49   Nm Hepatobiliary  01/12/2014   CLINICAL DATA:  Upper abdominal pain  EXAM: NUCLEAR MEDICINE HEPATOBILIARY IMAGING  Views:  Anterior right upper quadrant  Radionuclide:  Technetium 44mCholetec  Dose:  5.0 mCi  Route of administration: Intravenous  COMPARISON:  None.  FINDINGS: Liver uptake of radiotracer is normal. There is prompt visualization of gallbladder and small bowel, indicating patency of  the cystic and common bile ducts.  IMPRESSION: Study within normal limits.   Electronically Signed   By: WLowella GripM.D.   On: 01/12/2014 13:14   [place]  01/14/2014   CLINICAL DATA:  Acute kidney injury.  Renal insufficiency.  EXAM: RENAL/URINARY TRACT ULTRASOUND COMPLETE  COMPARISON:  None.  FINDINGS: Right Kidney:  Length: 11.6 cm. Diffusely increased parenchymal echogenicity seen, consistent with medical renal disease. No mass or hydronephrosis visualized.  Left Kidney:  Length: 11.9 cm. Diffusely increased parenchymal echogenicity seen, consistent with medical renal disease. No mass or hydronephrosis visualized. Left pleural effusion also noted.  Bladder:  Empty with Foley catheter in place.  IMPRESSION: Normal renal size with increased parenchymal echogenicity, consistent with medical renal disease. No evidence of hydronephrosis.  Left pleural effusion incidentally noted.   Electronically Signed   By: Earle Gell M.D.   On: 01/14/2014 21:09   Mr Foot Left W Wo Contrast  01/09/2014   CLINICAL DATA:  Nonhealing wound on the plantar surface of the foot the first MTP joint in the diabetic patient.  EXAM: MRI OF THE LEFT FOREFOOT WITHOUT AND WITH CONTRAST  TECHNIQUE: Multiplanar, multisequence MR imaging was performed both before and after administration of intravenous contrast.  CONTRAST:  15 mL MULTIHANCE GADOBENATE DIMEGLUMINE 529 MG/ML IV SOLN  COMPARISON:  Plain films of the left foot 01/05/2014.  FINDINGS: There is a skin ulceration on the plantar surface the foot at the level the first MTP joint. Extensive subcutaneous edema and enhancement are present about the foot consistent with cellulitis. No bone marrow signal abnormality to suggest osteomyelitis is seen. The medial sesamoid bone demonstrates decreased signal on both T1 and T2 weighted images without postcontrast enhancement likely due to chronic osteonecrosis. All visualized intrinsic musculature of the foot demonstrates increased T2  signal. There is postcontrast enhancement in the flexor hallucis brevis muscle. The patient has a small first MTP joint effusion.  IMPRESSION: Skin ulceration on the plantar surface of the foot at the first MTP joint without underlying abscess or osteomyelitis. Changes of diffuse cellulitis are identified. Edema and enhancement in the flexor hallucis brevis muscle are compatible with myositis.  Small first MTP joint effusion is likely aseptic and incidental.  Appearance of the medial sesamoid bone of the first MTP joint most consistent with chronic osteonecrosis.   Electronically Signed   By: Inge Rise M.D.   On: 01/09/2014 13:06   Nm Pulmonary Perf And Vent  01/09/2014   CLINICAL DATA:  Evaluate for pulmonary embolism  EXAM: NUCLEAR MEDICINE VENTILATION - PERFUSION LUNG SCAN  TECHNIQUE: Ventilation images were obtained in multiple projections using inhaled aerosol technetium 99 M DTPA. Perfusion images were obtained in multiple projections after intravenous injection of Tc-52mMAA.  COMPARISON:  DG CHEST 1V PORT dated 01/08/2014  RADIOPHARMACEUTICALS:  40 mCi Tc-939mTPA aerosol and 6 mCi Tc-9914mA  FINDINGS: Review of chest radiograph performed and prior demonstrates enlarged cardiac silhouette and mediastinal contours. Endotracheal tube overlies the tracheal air column with tip superior to the carina. There are bibasilar heterogeneous opacities, left greater than right. Small left-sided pleural effusion. Mild pulmonary venous congestion without definite evidence of edema.  Ventilation: There is clumping of inhaled radiotracer about the bilateral pulmonary hila. There is otherwise adequate ventilation of the bilateral pulmonary parenchyma. There is ingested radiotracer seen within the hypopharynx, stomach and proximal small bowel.  Perfusion: There is a linear area of relative oligemia about the right major fissure secondary to known partially layering pleural effusion. There is otherwise relative  homogeneous distribution of injected radiotracer throughout the pulmonary parenchyma. There are no discrete mismatched segmental or subsegmental filling defects to suggest pulmonary embolism.  IMPRESSION: Pulmonary embolism absent (very low probability for pulmonary embolism).   Electronically Signed   By: JohSandi MariscalD.   On: 01/09/2014 11:44   Us [place]at/ven Flow Abd Pelv Doppler  01/08/2014   CLINICAL DATA:  Hypotension and worsening liver function tests.  EXAM: DUPLEX ULTRASOUND OF LIVER  TECHNIQUE: Color and duplex Doppler ultrasound was performed to evaluate the hepatic in-flow and out-flow vessels.  COMPARISON:  DG ABD PORTABLE 1V  dated 01/07/2014; CT CHEST W/O CM dated 01/07/2014; US ABDOMEN LIMITED RUQ/ASCITES dated 01/08/2014  FINDINGS: Portal Vein Velocities  Main:  28-30 cm/sec  Right:  21 cm/sec  Left:  19 cm/sec  Hepatic Vein Velocities  Right:  48 cm/sec  Middle:  44 cm/sec  Left:  40 cm/sec  Hepatic Artery Velocity:  60 cm/sec  Splenic Vein Velocity:  21 cm/sec  Varices: None identified.  Ascites: None identified.  The portal and main hepatic veins are widely patent without evidence of thrombus. Flow direction and velocities are normal. The intrahepatic IVC is widely patent. The spleen is normal in size.  IMPRESSION: Unremarkable duplex ultrasound of the liver demonstrating no evidence of portal vein thrombosis or hepatic veno-occlusive disease.   Electronically Signed   By: Aletta Edouard M.D.   On: 01/08/2014 11:57   Dg Chest Port 1 View  01/08/2014   CLINICAL DATA:  Respiratory failure.  EXAM: PORTABLE CHEST - 1 VIEW  COMPARISON:  CT CHEST W/O CM dated 01/07/2014; DG CHEST 1V PORT dated 01/07/2014  FINDINGS: Endotracheal tube remains with the tip approximately 3.5 cm above the carina. The lungs show improved aeration at both bases with residual atelectasis remaining, left greater than right. There is a small left pleural effusion. No overt pulmonary edema is identified. The heart remains mildly  enlarged. No pneumothorax is present. Central line positioning is stable.  IMPRESSION: Improved aeration of both lower lobes, residual atelectasis remains, left greater than right with associated left pleural effusion.   Electronically Signed   By: Aletta Edouard M.D.   On: 01/08/2014 08:48   Dg Chest Port 1 View  01/07/2014   CLINICAL DATA:  ETT and central venous catheter.  EXAM: PORTABLE CHEST - 1 VIEW  COMPARISON:  01/07/2014 and 01/05/2014  FINDINGS: Enteric tube courses through the region of the stomach and off the inferior portion of the film as tip is not visualized. Endotracheal tube is present with tip 3.4 cm above the carina. A right subclavian central venous catheter is present with tip over the SVC below the level of the carina. Lungs are hypoinflated. There is no right-sided pneumothorax. There is mild worsening perihilar bibasilar opacification as findings likely due to small effusions with bibasilar atelectasis and possible mild vascular congestion. Cannot exclude infection. Mild stable cardiomegaly. Remainder of the exam is unchanged.  IMPRESSION: Hypoinflation with mild perihilar bibasilar opacification likely small effusions with bibasilar atelectasis. There is likely a component of vascular congestion. Cannot exclude infection in the lung bases.  Tubes and lines as described.  No evidence of pneumothorax.   Electronically Signed   By: Marin Olp M.D.   On: 01/07/2014 18:48   Dg Chest Port 1 View  01/07/2014   CLINICAL DATA:  Shortness of breath.  EXAM: PORTABLE CHEST - 1 VIEW  COMPARISON:  01/05/2014.  FINDINGS: The cardiac silhouette, mediastinal and hilar contours are Stable. Mild cardiac enlargement. There are bilateral effusions, left greater than right with overlying atelectasis. Low lung volumes with vascular crowding. Mild central vascular congestion but no overt pulmonary edema. The bony thorax is intact.  IMPRESSION: Persistent effusions and atelectasis.  Low lung volumes with  vascular crowding.   Electronically Signed   By: Kalman Jewels M.D.   On: 01/07/2014 16:40   Dg Abd Portable 1v  01/07/2014   CLINICAL DATA:  Severe right-side abdominal pain  EXAM: PORTABLE ABDOMEN - 1 VIEW  COMPARISON:  Portable exam 1622 hr without priors for comparison  FINDINGS: Scattered bowel  loops.  No bowel dilatation or bowel wall thickening.  Small left pleural effusion.  Slightly increased attenuation of the abdomen and nonvisualization of hepatic and splenic margins could reflect ascites.  Bones unremarkable.  No definite urinary tract calcification.  IMPRESSION: Nonobstructive bowel gas pattern.  Small left pleural effusion.  Cannot exclude ascites.   Electronically Signed   By: Lavonia Dana M.D.   On: 01/07/2014 16:32   Dg Foot Complete Left  01/05/2014   CLINICAL DATA:  Left-sided foot pain.  EXAM: LEFT FOOT - COMPLETE 3+ VIEW  COMPARISON:  No priors.  FINDINGS: Three views of the left foot demonstrate diffuse soft tissue swelling of the distal aspect of the great toe. Along the plantar surface of the foot there are several locular some gas in the swollen soft tissues beneath the distal first metatarsal, concerning for an ulcer and soft tissue infection. No definite osteolysis in the underlying bone to strongly suggest the presence of osteomyelitis at this time. No acute displaced fracture, subluxation or dislocation.  IMPRESSION: 1. Probable soft tissue ulceration/infection with gas in the plantar aspect of the foot beneath the great toe (adjacent to the distal first metatarsal). No definite signs to strongly suggest frank osteomyelitis at this time, but clinical correlation is recommended.   Electronically Signed   By: Vinnie Langton M.D.   On: 01/05/2014 17:30   US Abdomen Limited Ruq  01/08/2014   CLINICAL DATA:  Abdominal pain  EXAM: US ABDOMEN LIMITED - RIGHT UPPER QUADRANT  COMPARISON:  [place] ART/VEN ABD/PELV/SCROTUM DOP dated 01/08/2014; CT CHEST W/O CM dated 01/07/2014  FINDINGS:  Gallbladder:  There is marked thickening of the gallbladder wall measuring approximately 1.1 cm in diameter (image 8). This finding is associated with potential very minimal amount of associated pericholecystic fluid. No definitive radiopaque gallstones or gallbladder sludge. Sonographic Percell Miller sign is unable to be performed secondary to patient is unresponsive state.  Common bile duct:  Diameter: Normal in size measuring 5.8 mm in diameter  Liver:  Homogeneous hepatic echotexture. No discrete hepatic lesions. No definite evidence of intrahepatic biliary ductal dilatation. No ascites.  IMPRESSION: Findings are worrisome for acute acalculous cholecystitis. Further evaluation could be performed with HIDA scan as clinically indicated.  These results will be called to the ordering clinician or representative by the Radiologist Assistant, and communication documented in the PACS Dashboard.   Electronically Signed   By: Sandi Mariscal M.D.   On: 01/08/2014 12:07    2D Echo: 01/08/2014: Study Conclusions  - Left ventricle: The cavity size was normal. Wall thickness was increased in a pattern of moderate LVH. Systolic function was normal. The estimated ejection fraction was in the range of 50% to 55%. Features are consistent with a pseudonormal left ventricular filling pattern, with concomitant abnormal relaxation and increased filling pressure (grade 2 diastolic dysfunction). - Aortic valve: Mildly thickened leaflets. - Mitral valve: Mildly thickened leaflets . Mild regurgitation. - Left atrium: The atrium was moderately dilated. - Right ventricle: The cavity size was mildly dilated. Systolic function was mildly to moderately reduced. - Right atrium: The atrium was moderately dilated. - Tricuspid valve: Mild-moderate regurgitation. - Pulmonary arteries: PA peak pressure: 30m Hg (S). Moderately elevated pulmonary pressures. - Systemic veins: IVC dilated, with normal   Admission HPI:  Chief Complaint:  foot ulcer  History of Present Illness:  The patient is a 63yo man, history of DM2, HTN, presenting with a foot ulcer. The patient notes a left plantar foot ulcer, proximal to  the 1st MTP, present for about the last 1 year (per pt report), though gradually increasing in size, now with increasing pain with weight bearing. Associated with chills, but no fever, lightheadedness, or nausea. The patient was seen in clinic 2/11, at which time he was noted to have an elevated WBC = 14.1, ESR = 96, and foot x-ray revealing subcutaneous gas around the lesion. The patient was seen in clinic again today, and recommended for admission, to rule out osteomyelitis.   Problem list. Principal Problem:   Acute on chronic diastolic heart failure Active Problems:   Hypertension   Diabetes mellitus with renal manifestation   Hypoalbuminemia   Diabetic nephropathy with proteinuria   Cellulitis   Acute respiratory failure   New onset seizure   Acute renal failure   Acute acalculous cholecystitis   Elevated alkaline phosphatase level   Microcytic anemia   DVT of popliteal vein   Possible GI bleeding   Nephrotic syndrome   Anasarca  SIGNIFICANT EVENTS / STUDIES:  2/12 Admitted to [facility] with dx of infected diabetic foot ulcer, R/O osteromyelitis  2/13 Transferred to ICU with AMS, hypotension, bradycardia after verapamil  2/13 Witnessed seizure after transfer to ICU  2/13 LE venous duplex: positive for bilat peroneal DVTs  2/13 CT head >>> nad  2/13 CT chest >>> Bilateral effusions, mild edema, "cut off sign" RLL bronchus, non-specific adenopathy  2/14 Bronchoscopy >>> no endobronchial lesions, severe tracheobronchomalacia  2/14 Venous Doppler LE >>> Bilateral acute DVT  2/14 RUQ [place] >>> Findings are worrisome for acute acalculous cholecystitis (1.1cm gallbladder wall thickening)  2/14 Liver Doppler >>> no evidence of portal vein thrombosis or hepatic veno-occlusive disease  2/14 TTE >>> EF 55, grade 2  diastolic dysfunction, mildly dilated RV with moderately reduced function, PAP 49 torr  2/15 V/Q scan >>> no PE, very low probability for pulmonary embolism  2/15 MRI of Left foot >>> no osteo, diffuse cellulitis, myositis, small 1st MTP joint effusion that is likely aseptic and incidental, osteonecrosis of first MTP joint  2/18 HIDA scan >>> no abnormalities in hepaticbiliary tree  2/18 FOBT x2 >>> positive   LINES / TUBES:  OETT 2/13 >>> 2/14  OGT 2/13 >>> 2/14  R [place] CVL 2/13 >>> 2/17  Foley 2/13 >>> 2/16   CULTURES:  Blood 2/13 >>> 1 out of 2 positive for Staphylococcus species, coagulase negative  ANTIBIOTICS summary:  Vanc 2/12 >>> 2/13  Linezolid 2/13 >>> 2/16  Pip-tazo 2/13 >>> 2/16  Augmentin 2/16 >>> 2/17  Unasyn 2/17 >>> 2/19  Zosyn 2/19 >>> 2/20  Cephalexin 2/20 >>> 02/04/2014  Hospital Course by problem list:  1) Diastolic CHF: Improving volume status but still dyspneic and on O2 [place]. Echo with EF of 55%, and grade 2 diastolic dysfunction on ECHO. He still requires oxygen 2-3l/min supplementation via nasal canular but this can be discontinued once he is stable. Of note his repeat CXR on 01/20/2014 revealed a slight increment in his right pleural effusion but thoracocentesis was not required. Initially his lasix was held due to decline in renal function but this was restarted at discharge at 40 mg every other day. He is still 4L positive. He was evaluated by the heart failure team and if needed he can be referred to the heart failure clinic as outpatient. He will continue with Verapamil to 143m daily, Coreg 12.5 mg bid. Hold off ACEI with AKI. He will require BMET in 3 days to assess renal function and adjustment of the  lasix dose.    2) Iron-deficiency Anemia: Stable. Low normal hemoglobin 7-8 mg/dl. Iron panel shows low iron stores (Iron 19, Ferritin 179), and inappropriately low retic index (0.2). FOBT was found to be positive x2. No history of colonoscopy. Consulted GI who  recommends air- barium enema and an upper GI series as outpatient. He was transfused with 2 units of PRBCs  during his hospitalization and he remained stable. Stable Hgb Goal >7.0. His discharge hemoglobin level was 7.5.  Continue with Protonix 17m PO qD and Ferrous sulfate 3210mtablet TID.   3) AKI on CKD: Improving. In the setting of nephrotic syndrome and dCHF. AKI likely caused by intra-renal causes as aggressive diuresis has not improved kidney functions. Renal scan, with a normal-sized kidneys, but noted increased parenchymal echogenicity, consistent with medical renal disease. No evidence of hydronephrosis. SCr improving steadily. Nephrology recommended to check renal function as outpatient in a few days after discharge. Hold off ACEI until AKI resolves but we will allow PO lasix 40 mg every other day with close monitor of renal function.  4) Hypertension: stable. Goal pressure for proteinuric CKD is <130/80. He was discharged on hydralazine to 503mID, Verapamil 120 mg daily, Carvedilol to 12.5mg29mD and Imdur 30 mg daily.   5) Septic shock likely due to acalculous cholecystitis: Improved. Patient complained of RUQ pain especially after meals. Acalculous cholecystitis was found on RUQ ultrasound with gallbladder wall thickening of 1.1cm with chronically elevated alk phos and GGT ( in 2013, in 2015) indicating liver as source of elevation in alk phos. AST and ALT are now normal. TBili and DBili remained normal. 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 HIDA scan did not show any hepatobiliary abnormalities. Outpatient followup for cholecystectomy was recommended once patient is medically stable.   6) Diabetic of Left foot ulcer with Cellulitis: S/p irrigation and debridement 01/17/2014. MRI of L foot did not show osteomyelitis. Wound culture showed some Staph species and no anaerobes. ABI on left shows left pedal area with pressure >200, adequate for digital flow to support healing. He  will continue oral Cephlaxin for 2 weeks (tentative end date 02/04/14). Ortho to follow up as outpatient.   7) Acute lower Extremity DVT: Patient was found to have bilateral acute DVT without PE. Therapeutic on heparin with UFH of 0.44 and goal of 0.3-0.7. Supra therapeutic on Warfarin with INR of 3.92. Anticoagulation team following. If patient undergoes surgery, anticoagulation team will be notified to manage anticoagulation. He require anticoagulants for three months for first time symptomatic acute DVT . He was discharged with IV heparin and coumadin.  He will require regular INR checks - at least two times a week.   8) Seizure: resolved. Most likely seizures were induced by acute decompensation prior to his ICU course. EEG showed medication effect. Off keppra.   10) Hypoalbuminemia:  Patient has albumin of 1.7 with protein gap of 5.3 from total protein. Patient has a baseline hypoalbuminemia. However, UPEP showed elevated kappa and lambda free light chains, but the kappa/lambda ratio is normal (4). We are less concerned for paraprotein phenomenon even though patient has anemia, AKI, and elevated alkaline phosphate. Most likely cause is nephrotic syndrome. Patient does not appear to have liver disease given normal liver appearance on ultrasound and normal AST/ALT, and the acalculous cholecystitis can explain the elevated alk phos level.  -SPEP follow-up   Discharge Vitals:  BP 122/68  Pulse 68  Temp(Src) 98.4 F (36.9 C) (Oral)  Resp 17  Ht  '5\' 6"'  (1.676 m)  Wt 158 lb 15.2 oz (72.1 kg)  BMI 25.67 kg/m2  SpO2 95%  Discharge Labs:  Results for orders placed during the hospital encounter of 01/06/14 (from the past 24 hour(s))  GLUCOSE, CAPILLARY     Status: Abnormal   Collection Time    01/20/14  4:59 PM      Result Value Ref Range   Glucose-Capillary 214 (*) 70 - 99 mg/dL  GLUCOSE, CAPILLARY     Status: Abnormal   Collection Time    01/20/14  9:40 PM      Result Value Ref Range    Glucose-Capillary 239 (*) 70 - 99 mg/dL  PROTIME-INR     Status: Abnormal   Collection Time    01/21/14  3:36 AM      Result Value Ref Range   Prothrombin Time 19.5 (*) 11.6 - 15.2 seconds   INR 1.70 (*) 0.00 - 1.49  HEPARIN LEVEL (UNFRACTIONATED)     Status: None   Collection Time    01/21/14  3:36 AM      Result Value Ref Range   Heparin Unfractionated 0.44  0.30 - 0.70 IU/mL  CBC     Status: Abnormal   Collection Time    01/21/14  3:36 AM      Result Value Ref Range   WBC 12.3 (*) 4.0 - 10.5 K/uL   RBC 3.05 (*) 4.22 - 5.81 MIL/uL   Hemoglobin 7.5 (*) 13.0 - 17.0 g/dL   HCT 23.9 (*) 39.0 - 52.0 %   MCV 78.4  78.0 - 100.0 fL   MCH 24.6 (*) 26.0 - 34.0 pg   MCHC 31.4  30.0 - 36.0 g/dL   RDW 19.7 (*) 11.5 - 15.5 %   Platelets 399  150 - 400 K/uL  BASIC METABOLIC PANEL     Status: Abnormal   Collection Time    01/21/14  3:36 AM      Result Value Ref Range   Sodium 140  137 - 147 mEq/L   Potassium 5.1  3.7 - 5.3 mEq/L   Chloride 103  96 - 112 mEq/L   CO2 28  19 - 32 mEq/L   Glucose, Bld 163 (*) 70 - 99 mg/dL   BUN 36 (*) 6 - 23 mg/dL   Creatinine, Ser 1.59 (*) 0.50 - 1.35 mg/dL   Calcium 8.5  8.4 - 10.5 mg/dL   GFR calc non Af Amer 45 (*) >90 mL/min   GFR calc Af Amer 52 (*) >90 mL/min  GLUCOSE, CAPILLARY     Status: Abnormal   Collection Time    01/21/14  7:32 AM      Result Value Ref Range   Glucose-Capillary 187 (*) 70 - 99 mg/dL  GLUCOSE, CAPILLARY     Status: Abnormal   Collection Time    01/21/14 11:50 AM      Result Value Ref Range   Glucose-Capillary 191 (*) 70 - 99 mg/dL  GLUCOSE, CAPILLARY     Status: Abnormal   Collection Time    01/21/14  4:11 PM      Result Value Ref Range   Glucose-Capillary 217 (*) 70 - 99 mg/dL   Comment 1 Notify RN     Comment 2 Documented in Chart      Signed: Jessee Avers 01/21/2014, 4:20 PM   Time Spent on Discharge: 1 hour Services Ordered on Discharge: CIR versus SNF Equipment Ordered on Discharge: rolling walker  with 5" wheels, Oxygen

## 2014-01-18 NOTE — Progress Notes (Signed)
Occupational Therapy Treatment Patient Details Name: Joshua [name] MRN: [id number] DOB: 1951-06-06 Today's Date: 01/18/2014 Time: [id number] OT Time Calculation (min): 24 min  OT Assessment / Plan / Recommendation  History of present illness 63 y.o. male admitted to [facility] on 01/06/14 with DM2, previous treatment (abbreviated) for extrapulmonary TB admitted with worseing foot ulcer with increasing pain, chills.  He had a foot ulcer plantar surface first metatarsal head left foot.  His hospital course was complicated by bradycardia, hypotension, seizure in the ICU.  The pt was intubated 01/07/14-01/08/14.  He was found to have bil peroneal DVTs, bil pleural effusions on chest CT (neg for PE), abdominal US revealed acute acalculous cholecystitis, and MRI of the foot showed no osteo, diffuse cellulitis, myositis, small 1st MTP joint effusion that is likely aseptic and incidental, osteonecrosis of first MTP joint.  Ortho preformed a bedside aspiration of the wound on 01/11/14.  Ortho recommends WBAT in post-op shoe.     OT comments  Agreeable to participation but requires increased time for task completion secondary to sob and need for rest breaks.  Inst. On breathing tech. To aide in recovery.  Pt. Visibly frustrated with his limitations.  Able to don/doff lb garments with s/cga in supported sitting.  Sit/stand x 1 mod a with cues for use of ues.  Stood approx. 10 sec. Before needing to rest.  Would benefit from con't. Therapy to increase strength and ind. With adls  Follow Up Recommendations  CIR                  Recommendations for Other Services Rehab consult  Frequency Min 2X/week   Progress towards OT Goals Progress towards OT goals: Progressing toward goals  Plan Discharge plan remains appropriate    Precautions / Restrictions Precautions Precautions: Fall Precaution Comments: pt is very deconditioned and weak Required Braces or Orthoses: Other Brace/Splint Other Brace/Splint: post op  shoe, left foot Restrictions LLE Weight Bearing: Weight bearing as tolerated Other Position/Activity Restrictions: Called [name] ortho PA who confirmed WBAT status and would like pt to walk in post-op shoe.     Pertinent Vitals/Pain Denies pain    ADL  Lower Body Dressing: Performed;Minimal assistance Where Assessed - Lower Body Dressing: Supported sitting Transfers/Ambulation Related to ADLs: s><stand from recliner x 1 with mod/max a and cues for hand placemnent and to push through walker to come into upright standing, pt. unable to maintain and states "this is it, this is all i can give you" ADL Comments: don/doff right sock with 2 rest breaks during doff secondary to sob and 2 rest breaks during don for same issue.  encouraged PLB tech. and provided emotional support as pt. was visibly upset with his limitations    OT Goals(current goals can now be found in the care plan section)    Visit Information  Last OT Received On: 01/18/14 History of Present Illness: 63 y.o. male admitted to [facility] on 01/06/14 with DM2, previous treatment (abbreviated) for extrapulmonary TB admitted with worseing foot ulcer with increasing pain, chills.  He had a foot ulcer plantar surface first metatarsal head left foot.  His hospital course was complicated by bradycardia, hypotension, seizure in the ICU.  The pt was intubated 01/07/14-01/08/14.  He was found to have bil peroneal DVTs, bil pleural effusions on chest CT (neg for PE), abdominal US revealed acute acalculous cholecystitis, and MRI of the foot showed no osteo, diffuse cellulitis, myositis, small 1st MTP joint effusion that is likely aseptic and  incidental, osteonecrosis of first MTP joint.  Ortho preformed a bedside aspiration of the wound on 01/11/14.  Ortho recommends WBAT in post-op shoe.                   Cognition  Cognition Arousal/Alertness: Awake/alert Behavior During Therapy: WFL for tasks assessed/performed Overall Cognitive Status:  Within Functional Limits for tasks assessed    Mobility  Bed Mobility General bed mobility comments: up in chair upon arrival Transfers Overall transfer level: Needs assistance Equipment used: Rolling walker (2 wheeled) Transfers: Sit to/from Stand Sit to Stand: Mod assist General transfer comment: mod a for sit/stand from recliner with incrased time and cues to bring b ues to walker and try to stand upright, pt. also sliding on boot which made transition difficult              End of Session OT - End of Session Equipment Utilized During Treatment: Rolling walker;Oxygen Activity Tolerance: Patient limited by fatigue Patient left: in chair;with call bell/phone within reach       Janice Coffin, COTA/L 01/18/2014, 12:08 PM

## 2014-01-18 NOTE — Progress Notes (Signed)
Physical Therapy Treatment Patient Details Name: Joshua [name] MRN: [id number] DOB: 1950/12/22 Today's Date: 01/18/2014 Time: 1203-1228 PT Time Calculation (min): 25 min  PT Assessment / Plan / Recommendation  History of Present Illness 63 y.o. male admitted to [facility] on 01/06/14 with DM2, previous treatment (abbreviated) for extrapulmonary TB admitted with worseing foot ulcer with increasing pain, chills.  He had a foot ulcer plantar surface first metatarsal head left foot.  His hospital course was complicated by bradycardia, hypotension, seizure in the ICU.  The pt was intubated 01/07/14-01/08/14.  He was found to have bil peroneal DVTs, bil pleural effusions on chest CT (neg for PE), abdominal US revealed acute acalculous cholecystitis, and MRI of the foot showed no osteo, diffuse cellulitis, myositis, small 1st MTP joint effusion that is likely aseptic and incidental, osteonecrosis of first MTP joint.  Ortho preformed a bedside aspiration of the wound on 01/11/14.  Ortho recommends WBAT in post-op shoe.  S/p I&D L foot 12/17/13.     PT Comments   Pt progressing well with mobility today and was able to increase his gait distance with RW.  He did desat on RA at rest before gait, so O2 [place] used during gait.  He continues to be an excellent inpatient rehab candidate and is progressing nicely.  Pt reports he does have a shoe that we can use next session to try to even out the height difference created by the post-op shoe and bandages.    Follow Up Recommendations  CIR     Does the patient have the potential to tolerate intense rehabilitation    Yes  Barriers to Discharge   None, cousin says he can come stay with him and his wife and his wife will provide 24/7 assist at d/c.        Equipment Recommendations  Rolling walker with 5" wheels    Recommendations for Other Services   None  Frequency Min 3X/week   Progress towards PT Goals Progress towards PT goals: Progressing toward goals  Plan Current  plan remains appropriate    Precautions / Restrictions Precautions Precautions: Fall;Other (comment) Precaution Comments: pt is very deconditioned and weak.  Desats with activity as of 01/18/14  Required Braces or Orthoses: Other Brace/Splint Other Brace/Splint: post op shoe, left foot Restrictions LLE Weight Bearing: Weight bearing as tolerated Other Position/Activity Restrictions: Called Joshua [name] ortho PA who confirmed WBAT status and would like pt to walk in post-op shoe.     Pertinent Vitals/Pain Pt desated on RA at rest to 87%, so O2 [place] used for gait.  DOE 2/4 with gait.  O2 sats 91% with 2 L O2 [place] during gait.  HR 80s-90s during mobility.     Mobility  Bed Mobility General bed mobility comments: up in chair upon arrival Transfers Overall transfer level: Needs assistance Equipment used: Rolling walker (2 wheeled) Transfers: Sit to/from Stand Sit to Stand: Mod assist General transfer comment: Mod assist from low recliner chair.  Left foot blocked due to decreased traction with post op shoe.  Verbal cues to use both hands to push up from the chair to get to standing.  Ambulation/Gait Ambulation/Gait assistance: Min assist;Mod assist Ambulation Distance (Feet): 75 Feet Assistive device: Rolling walker (2 wheeled) Gait Pattern/deviations: Step-to pattern;Trunk flexed;Antalgic Gait velocity: decreased Gait velocity interpretation: <1.8 ft/sec, indicative of risk for recurrent falls General Gait Details: Pt with increased gait distance today.  Still needs cues for RW use/safety.  Post-op shoe on left foot, sock on right  making him have a vaulting gait pattern due to the difference in height.  Min assist, generally for balance of trunk during gait.  Less right lean today and better upright posture when inside of RW.  Pt needed mod assist while turning with RW to help support trunk for balance and physically manage RW.      Exercises  pt reports doing "some" leg exercises that we  reviewed on his own during previous sessions.  His lunch arrived while we were walking, so exercises deferred until next session.      PT Goals (current goals can now be found in the care plan section) Acute Rehab PT Goals Patient Stated Goal: eventually to go home and be independent again  Visit Information  Last PT Received On: 01/18/14 Reason Eval/Treat Not Completed: Other (comment) (pt working with OT.  PT to check back soon.  ) History of Present Illness: 63 y.o. male admitted to [facility] on 01/06/14 with DM2, previous treatment (abbreviated) for extrapulmonary TB admitted with worseing foot ulcer with increasing pain, chills.  He had a foot ulcer plantar surface first metatarsal head left foot.  His hospital course was complicated by bradycardia, hypotension, seizure in the ICU.  The pt was intubated 01/07/14-01/08/14.  He was found to have bil peroneal DVTs, bil pleural effusions on chest CT (neg for PE), abdominal US revealed acute acalculous cholecystitis, and MRI of the foot showed no osteo, diffuse cellulitis, myositis, small 1st MTP joint effusion that is likely aseptic and incidental, osteonecrosis of first MTP joint.  Ortho preformed a bedside aspiration of the wound on 01/11/14.  Ortho recommends WBAT in post-op shoe.  S/p I&D L foot 12/17/13.      Subjective Data  Subjective: Pt reports that he doesn't hurt, but is weak all over.   Patient Stated Goal: eventually to go home and be independent again   Cognition  Cognition Arousal/Alertness: Awake/alert Behavior During Therapy: WFL for tasks assessed/performed Overall Cognitive Status: Within Functional Limits for tasks assessed    Balance  General Comments General comments (skin integrity, edema, etc.): Pt desated on RA at rest to 87%, so O2 [place] used for gait.  DOE 2/4 with gait.  O2 sats 91% with 2 L O2 [place] during gait.  HR 80s-90s during mobility.    End of Session PT - End of Session Equipment Utilized During Treatment: Gait  belt;Oxygen Activity Tolerance: Patient limited by fatigue Patient left: in chair;with call bell/phone within reach     [place] B. Webb, [place], DPT [phone]   01/18/2014, 12:38 PM

## 2014-01-18 NOTE — Progress Notes (Addendum)
ANTICOAGULATION CONSULT NOTE - Follow Up Consult  Pharmacy Consult for heparin>>warfarin Indication: DVT  No Known Allergies  Patient Measurements: Height: 5\' 6"  (167.6 cm) Weight: 159 lb 8 oz (72.349 kg) IBW/kg (Calculated) : 63.8 Heparin Dosing Weight: 71kg  Vital Signs: Temp: 98.6 F (37 C) (02/24 1137) Temp src: Oral (02/24 1137) BP: 153/81 mmHg (02/24 1137) Pulse Rate: 91 (02/24 1221)  Labs:  Recent Labs  01/16/14 0540 01/16/14 1725 01/17/14 0540 01/18/14 0356 01/18/14 1140  HGB 7.9*  --  8.3* 8.2*  --   HCT 24.3*  --  26.1* 25.6*  --   PLT 427*  --  423* 434*  --   LABPROT 20.7*  --  20.8* 20.7*  --   INR 1.84*  --  1.85* 1.84*  --   HEPARINUNFRC  --  0.47 0.73*  --  0.44  CREATININE 2.19*  --  1.78* 1.74*  --     Estimated Creatinine Clearance: 39.7 ml/min (by C-G formula based on Cr of 1.74).  Assessment: 62 YOM on heparin bridging to warfarin Patient s/p I&D 2/23, INR subtherapeutic and stable from yesterday at 1.8. Heparin level at goal this morning noted, no bleeding complications noted. CBC stable from yesterday.  Renal bx appears to be cancelled, restart warfarin tonight.  Goal of Therapy:  INR goal 2-3 Heparin level 0.3-0.7 units/ml Monitor platelets by anticoagulation protocol: Yes   Plan:  1. Continue heparin at 1500 units/hr  2. Daily HL, INR, and CBC 3. Follow for s/s bleeding 4. Warfarin 5mg  tonight  Erin Hearing PharmD., BCPS Clinical Pharmacist Pager [phone] 01/18/2014 1:34 PM

## 2014-01-18 NOTE — Progress Notes (Signed)
Medical Student Daily Progress Note  Subjective: No events overnight. Patient denies any shortness of breath and was able to sit in chair for a while at night w/o oxygen per patient report.   Objective: Vital signs in last 24 hours: Filed Vitals:   01/17/14 2115 01/17/14 2130 01/17/14 2138 01/18/14 0608  BP: 122/75 139/78  143/77  Pulse: 79 79 78 88  Temp:   97.7 F (36.5 C) 98 F (36.7 C)  TempSrc:    Oral  Resp: '14 14 15 18  ' Height:      Weight:    72.349 kg (159 lb 8 oz)  SpO2: 92% 94% 94% 100%   Weight change:   Intake/Output Summary (Last 24 hours) at 01/18/14 0842 Last data filed at 01/18/14 8299  Gross per 24 hour  Intake    960 ml  Output   1300 ml  Net   -340 ml   Physical Exam: BP 143/77  Pulse 88  Temp(Src) 98 F (36.7 C) (Oral)  Resp 18  Ht '5\' 6"'  (1.676 m)  Wt 72.349 kg (159 lb 8 oz)  BMI 25.76 kg/m2  SpO2 100% General appearance: alert, cooperative and no distress Back: symmetric, no curvature. ROM normal. No CVA tenderness. Lungs: clear to auscultation bilaterally Heart: S4 vs S1 split still present, regular rate, no murmurs Abdomen: soft, non-tender; bowel sounds normal; no masses,  no organomegaly Extremities: 2+ pitting edema bilaterally Pulses: 2+ and symmetric Lab Results: BMET    Component Value Date/Time   NA 140 01/18/2014 0356   K 4.6 01/18/2014 0356   CL 103 01/18/2014 0356   CO2 27 01/18/2014 0356   GLUCOSE 183* 01/18/2014 0356   BUN 37* 01/18/2014 0356   CREATININE 1.74* 01/18/2014 0356   CREATININE 1.14 01/05/2014 1616   CALCIUM 8.6 01/18/2014 0356   GFRNONAA 40* 01/18/2014 0356   GFRAA 47* 01/18/2014 0356    CBC    Component Value Date/Time   WBC 12.8* 01/18/2014 0356   RBC 3.34* 01/18/2014 0356   RBC 2.98* 01/12/2014 2308   HGB 8.2* 01/18/2014 0356   HCT 25.6* 01/18/2014 0356   PLT 434* 01/18/2014 0356   MCV 76.6* 01/18/2014 0356   MCH 24.6* 01/18/2014 0356   MCHC 32.0 01/18/2014 0356   RDW 19.1* 01/18/2014 0356   LYMPHSABS 1.5  01/05/2014 1616   MONOABS 1.5* 01/05/2014 1616   EOSABS 0.1 01/05/2014 1616   BASOSABS 0.0 01/05/2014 1616    Lab Results  Component Value Date   INR 1.84* 01/18/2014   INR 1.85* 01/17/2014   INR 1.84* 01/16/2014   Lab Results  Component Value Date   HEPAIGM NON REACTIVE 01/14/2014   HEPBIGM NON REACTIVE 01/14/2014   HCV RNA: pending  Micro Results: Recent Results (from the past 240 hour(s))  ANAEROBIC CULTURE     Status: None   Collection Time    01/11/14  8:31 AM      Result Value Ref Range Status   Specimen Description WOUND FOOT LEFT   Final   Special Requests     Final   Value: LEFT PLANTAR ULCER PT ON ZOSYN AND ZYVOX AND AUGMENTIN   Gram Stain     Final   Value: NO WBC SEEN     NO SQUAMOUS EPITHELIAL CELLS SEEN     NO ORGANISMS SEEN     Performed at [facility]   Culture     Final   Value: NO ANAEROBES ISOLATED     Performed at  [facility]   Report Status 01/16/2014 FINAL   Final  WOUND CULTURE     Status: None   Collection Time    01/11/14  8:31 AM      Result Value Ref Range Status   Specimen Description WOUND FOOT LEFT   Final   Special Requests LEFT PLANTAR ULCER PT ON ZYVOX,ZOSYN AND AUGMENTIN   Final   Gram Stain     Final   Value: NO WBC SEEN     NO SQUAMOUS EPITHELIAL CELLS SEEN     NO ORGANISMS SEEN     Performed at [facility]   Culture     Final   Value: FEW STAPHYLOCOCCUS AUREUS     Note: RIFAMPIN AND GENTAMICIN SHOULD NOT BE USED AS SINGLE DRUGS FOR TREATMENT OF STAPH INFECTIONS. This organism DOES NOT demonstrate inducible Clindamycin resistance in vitro.     Performed at [facility]   Report Status 01/14/2014 FINAL   Final   Organism ID, Bacteria STAPHYLOCOCCUS AUREUS   Final  CULTURE, ROUTINE-ABSCESS     Status: None   Collection Time    01/17/14  8:17 PM      Result Value Ref Range Status   Specimen Description ABSCESS FOOT LEFT   Final   Special Requests PATIENT ON FOLLOWING KEFZOL   Final   Gram Stain      Final   Value: ABUNDANT WBC PRESENT, PREDOMINANTLY PMN     NO SQUAMOUS EPITHELIAL CELLS SEEN     NO ORGANISMS SEEN     Performed at [facility]   Culture PENDING   Incomplete   Report Status PENDING   Incomplete  ANAEROBIC CULTURE     Status: None   Collection Time    01/17/14  8:17 PM      Result Value Ref Range Status   Specimen Description ABSCESS FOOT LEFT   Final   Special Requests PATIENT ON FOLLOWING KEFZOL   Final   Gram Stain     Final   Value: ABUNDANT WBC PRESENT, PREDOMINANTLY PMN     NO SQUAMOUS EPITHELIAL CELLS SEEN     NO ORGANISMS SEEN     Performed at [facility]   Culture PENDING   Incomplete   Report Status PENDING   Incomplete   Studies/Results: No results found. Medications: I have reviewed the patient's current medications. Scheduled Meds: . carvedilol  12.5 mg Oral BID WC  . cephALEXin  500 mg Oral 3 times per day  . ferrous sulfate  325 mg Oral TID WC  . hydrALAZINE  50 mg Oral 3 times per day  . insulin aspart  0-9 Units Subcutaneous TID WC  . isosorbide mononitrate  30 mg Oral Daily  . pantoprazole  20 mg Oral QAC breakfast  . verapamil  120 mg Oral Daily   Continuous Infusions: . heparin 1,500 Units/hr (01/18/14 0328)   PRN Meds:.HYDROcodone-acetaminophen, HYDROmorphone (DILAUDID) injection, morphine injection, ondansetron (ZOFRAN) IV, sodium chloride Assessment/Plan: Principal Problem:   Acute on chronic diastolic heart failure Active Problems:   Hypertension   Diabetes mellitus with renal manifestation   Hypoalbuminemia   Diabetic nephropathy with proteinuria   Cellulitis   Acute respiratory failure   New onset seizure   Acute renal failure   Acute acalculous cholecystitis   Elevated alkaline phosphatase level   Microcytic anemia   DVT of popliteal vein   Possible GI bleeding   Nephrotic syndrome   Anasarca   LOS: 12 days  [name] is a 63yo man with history of T2DM, HTN, L foot ulcer, previously treated  extrapulmonary TB who has returned to IMTS from ICU with sepsis possibly due to acalculous cholecystitis. The most active issues include dCHF, AKI on CKD with nephrotic syndrome, and possible GIB.   # AKI on CKD: In the setting of nephrotic syndrome and dCHF. AKI is most likely caused by intra-renal causes as aggressive diuresis has not improved kidney functions. 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 Renal scan shows normal-sized kidneys, but notes increased parenchymal echogenicity, consistent with medical renal disease. No evidence of hydronephrosis. Cr decreased to 1.74 from 2.53 three days ago. Renal scan consistent with medical renal disease. No hydronephrosis. Patient is already on anticoagulation, thus RVT scan is not needed because it will not change management. Nephrology does not believe that a renal biopsy is necessary. Patient will be followed on an outpatient basis and may need RRT in the future.  - Nephrology recs appreciated  - Hold off ACEI until AKI resolves  - Hep C RNA follow-up  - coagulation panel (nl complement level, ASO negative, anti-phospholipids pending)  - restart heparin and warfarin this am   # dCHF: Echo with EF of 55%, and grade 2 diastolic dysfunction on ECHO. He still have excess volume on board and he is symptomatic with dyspnea and requiring supplementary O2 therapy.   Plan  - off lasix now as recommended by renal  - Verapamil 138m daily  - Coreg 12.5 mg bid mainly for BP control.  - hold off ACEI with AKI  - Will aim for negative or even fluid balance. Still making urine but +4.2L up  - PT recommended CIR consult once patient is ready for discharge.   # Iron-deficiency Anemia: Low normal hemoglobin. Today at 8.3. Hemoglobin of 8.2 in 2015, but 13.9 in 2010. Iron panel shows low iron stores (Iron 19, Ferritin 179), and inappropriately low retic index (0.2). FOBT is positive x2. No colonoscopy on file, but there  was 3 negative FOBT in 10/2012. GI recommends air- barium enema and an upper GI series for peptic ulcers as outpatient as he is no a candidate for scoping due to clinical status. Transfused with one 1 unit of PRBCs. AIHA not suspected at this time given normal bilirubin.  Plan  - postransfusion Hbg 7.9 on 2/22. Goal above 8 given his co-morbidities.  - will check again tomorrow a.m and see if he need more transfusion  - Protonix 236mPO qD: upper GI bleed and RUQ pain after eating  - Ferrous sulfate 32514mablet TID   # HTN: still somehow elevated today. Goal pressure for proteinuric CKD is <130/80.  Plan  - Continue with hydralazine to 20m10mD.  - Verapamil 120mg74m - increased Carvedilol to 12.5mg B87m - Isosorbide dinitrate 30 mg daily   # Septic shock likely due to acalculous cholecystitis: Acalculous cholecystitis was found on RUQ ultrasound with gallbladder wall thickening of 1.1cm with chronically elevated alk phos and GGT ( in 2013, in 2015) indicating liver as source of elevation in alk phos. AST and ALT are now normal. TBili and DBili remained normal. 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 CBC is still showing leukocytosis. Other causes for sepsis that are ruled out are systemic bacteremia (only 1 out of 2 cultures with coagulase negative staph) and osteomyelitis (MRI does not show osteomyelitis). Patient was previously evaluated by surgery team, who  recommended a HIDA scan. This did not show any hepatobiliary abnormalities. Outpatient followup for cholecystectomy was recommended once patient is medically stable.   # Diabetic foot ulcer: MRI of L foot did not show osteomyelitis, but did show local cellulitis and possible myositis. Wound debrided and packed to prevent abscess. Wound culture showed some Staph species and no anaerobes. ABI on left shows left pedal area with pressure >200, adequate for digital flow to support healing.  -Cephlaxin should cover staph species in  wound, and will continue this regimen until infection has appeared to be resolved.  -Wound packing to prevent abscess  -s/p I&D, will be on antibiotics for 5 more days  # Acute DVT: Patient was found to have bilateral acute DVT without PE. Therapeutic on heparin with UFH of 0.44 and goal of 0.3-0.7. Supra therapeutic on Warfarin with INR of 3.92. Anticoagulation team following. If patient undergoes surgery, anticoagulation team will be notified to manage anticoagulation. He require anticoagulants for three months for first time symptomatic acute DVT .  - switched to heparin due to possible surgery  - will need to restart heparin and warfarin and need warfarin to be therapeutic x 2 days before discharge on outpatient warfarin.   # T2DM: CBG  - Hold Lantus until patient is no longer NPO, consider starting Lantus 10 units with breakfast as glucose during the day is elevated, but fasting glucose is around 120.  - SSI   # Seizure: resolved issue. Most likely seizures were induced by acute decompensation prior to his ICU course. EEG showed medication effect. Off keppra now.   # Hypoalbuminemia:  Patient has albumin of 1.7 with protein gap of 5.3 from total protein. Patient has a baseline hypoalbuminemia. However, UPEP showed elevated kappa and lambda free light chains, but the kappa/lambda ratio is normal (4). SPEP did not show M spike. We are less concerned for paraprotein phenomenon even though patient has anemia, AKI, and elevated alkaline phosphate. Most likely cause is nephrotic syndrome. Patient does not appear to have liver disease given normal liver appearance on ultrasound and normal AST/ALT, and the acalculous cholecystitis can explain the elevated alk phos level.   Proph:  DVT: already on UFH  Mobility: PT and OT request rehab consult for patient. Patient requires walker  Dispo: wait on rehab consult for placement.    This is a Careers information officer Note.  The care of the patient was discussed  with Dr. Linus Salmons and the assessment and plan formulated with their assistance.  Please see their attached note for official documentation of the daily encounter.  Konrad Saha 01/18/2014, 8:42 AM

## 2014-01-18 NOTE — Progress Notes (Signed)
I have seen the patient and reviewed the daily progress note by Elvin So MS IV and discussed the care of the patient with them.  Please see note for my findings, assessment, and plans/additions in my full progress note.

## 2014-01-18 NOTE — Progress Notes (Signed)
Subjective: 1 Day Post-Op Procedure(s) (LRB): IRRIGATION AND DEBRIDEMENT  LEFT FOOT (Left) Patient doing well this AM, pain well controlled, resting comfortably and eating lunch.  Pt denies N/V/F/C, chest pain, SOB, calf pain, or changes in appetite.  Objective: Vital signs in last 24 hours: Temp:  [97.7 F (36.5 C)-98.6 F (37 C)] 98.6 F (37 C) (02/24 1137) Pulse Rate:  [75-91] 91 (02/24 1221) Resp:  [14-18] 18 (02/24 1137) BP: (117-153)/(64-81) 153/81 mmHg (02/24 1137) SpO2:  [87 %-100 %] 91 % (02/24 1221) Weight:  [72.349 kg (159 lb 8 oz)] 72.349 kg (159 lb 8 oz) (02/24 [id number])  Intake/Output from previous day: 02/23 0701 - 02/24 0700 In: 960 [P.O.:360; I.V.:600] Out: 1300 [Urine:1300] Intake/Output this shift: Total I/O In: 600 [P.O.:600] Out: 300 [Urine:300]   Recent Labs  01/16/14 0540 01/17/14 0540 01/18/14 0356  HGB 7.9* 8.3* 8.2*    Recent Labs  01/17/14 0540 01/18/14 0356  WBC 16.2* 12.8*  RBC 3.47* 3.34*  HCT 26.1* 25.6*  PLT 423* 434*    Recent Labs  01/17/14 0540 01/18/14 0356  NA 139 140  K 4.8 4.6  CL 102 103  CO2 27 27  BUN 41* 37*  CREATININE 1.78* 1.74*  GLUCOSE 179* 183*  CALCIUM 8.5 8.6    Recent Labs  01/17/14 0540 01/18/14 0356  INR 1.85* 1.84*   NAD, A/Ox3, appears stated age, EOMI, mood and affect normal, respirations unlabored.  Dressing c/d/i, in proper placement and in good repair.  Toes mobile perfused with cap refill <5 secs.  Decreased sensation to light touch distally.     Assessment/Plan: 1 Day Post-Op Procedure(s) (LRB): IRRIGATION AND DEBRIDEMENT  LEFT FOOT (Left) Continue antibiotics, dressing changes, and anticoagulation.  Continue medical management as dictated by Dr. Linus Salmons.   Rainn Zupko S 01/18/2014, 5:08 PM

## 2014-01-18 NOTE — Progress Notes (Signed)
I met with pt at bedside to discuss his need for rehab prior to d/c home with his cousin who can provide 24/7 assist with he and his wife. Pt states he has insurance through his employer at [employer]. I have called his assistant manager and HR department to ask for clarification. I also contacted the financial counselor, Michelle Jordan, to ask for her assistance in clarifying payor. I have discussed with RN CM and MD. I wiil follow up today. [phone] 

## 2014-01-18 NOTE — Op Note (Signed)
NAMEKHYE, DEVILLIERS NO.:  [id number]  MEDICAL RECORD NO.:  [id number]  LOCATION:  [id number]                        FACILITY:  [facility]  PHYSICIAN:  Wylene Simmer, MD        DATE OF BIRTH:  03/20/1951  DATE OF PROCEDURE:  01/17/2014 DATE OF DISCHARGE:                              OPERATIVE REPORT   PREOPERATIVE DIAGNOSIS:  Left diabetic foot abscess.  POSTOPERATIVE DIAGNOSIS:  Left diabetic foot abscess.  PROCEDURE:  Irrigation and debridement of left diabetic foot abscess including multiple dorsal and plantar areas.  SURGEON:  Wylene Simmer, MD  ANESTHESIA:  General.  ESTIMATED BLOOD LOSS:  Minimal.  TOURNIQUET TIME:  16 minutes with an ankle Esmarch.  SPECIMEN:  Deep tissue to microbiology for aerobic and anaerobic cultures.  COMPLICATIONS:  None apparent.  DISPOSITION:  Extubated, awake and stable to recovery.  INDICATIONS FOR PROCEDURE:  The patient is a 63 year old male with past medical history significant for diabetes, heart disease, and renal failure.  He has a diabetic foot wound on plantar surface of his foot that has failed to improve despite packing.  He appears to have developed an abscess at the forefoot and presents now for operative treatment of this condition.  He understands the risks and benefits, the alternative treatment options and elects surgical treatment.  He specifically understands risks of bleeding, infection, nerve damage, blood clots, need for additional surgery, amputation, and death.  PROCEDURE IN DETAIL:  After preoperative consent was obtained, and the correct operative site was identified, the patient was brought to the operating room and placed supine on the operating table.  General anesthesia was induced.  The patient was already on therapeutic antibiotics.  Surgical time-out was taken.  The left lower extremity was prepped and draped in standard sterile fashion.  Foot was elevated and an Esmarch tourniquet was wrapped  around the ankle.  The patient's plantar foot wound was identified.  An elliptical incision was made around this excising the entire ulcer.  The incision was extended proximally.  A dorsal incision was then made at the first webspace. Sharp dissection was carried down through skin and subcutaneous tissue. Immediately evident was copious purulent material.  A large specimen was collected and sent to microbiology for aerobic and anaerobic culture. The wound was then debrided circumferentially in excisional fashion with scissors as well as a rongeur and curette.  All necrotic and purulent material was excised.  The abscess appeared to extend in the subcutaneous tissue over the dorsum of the first MTP joint, and then down to the first web space into the plantar musculature of the foot. This area was all debrided from the dorsum as well as from the plantar incision excisionally as described above.  The wound was then irrigated with 3 L of normal saline.  No evidence of any purulence or necrotic tissue remained.  A Penrose drain was then passed from dorsal to plantar in the left hanging out of both wounds.  Both wounds were then packed with sterile 4x4s.  Bone was packed dorsally and one plantarly.  This was done after release of the tourniquet and achieving hemostasis with electrocautery.  Dry dressings were then applied dorsally  and plantarly followed by ABDs, Kerlix, and a 4-inch Ace wrap.  The patient was then awakened from anesthesia and transported to the recovery room in stable condition.  FOLLOWUP PLAN:  The patient will be weightbearing as tolerated on his left foot in a postop shoe.  He will begin dressing changes on postop day 1 or 2 depending on the appearance of his dressings.  He will continue on antibiotics per the primary team.     Wylene Simmer, MD     JH/MEDQ  D:  01/17/2014  T:  01/18/2014  Job:  [id number]

## 2014-01-18 NOTE — Progress Notes (Signed)
[facility] ROUNDING NOTE   Subjective:   Interval History: no complaints today   Objective:  Vital signs in last 24 hours:  Temp:  [97.7 F (36.5 C)-98.6 F (37 C)] 98.6 F (37 C) (02/24 1137) Pulse Rate:  [73-91] 91 (02/24 1221) Resp:  [14-18] 18 (02/24 1137) BP: (117-153)/(64-81) 153/81 mmHg (02/24 1137) SpO2:  [87 %-100 %] 91 % (02/24 1221) Weight:  [72.349 kg (159 lb 8 oz)] 72.349 kg (159 lb 8 oz) (02/24 0608)  Weight change:  Filed Weights   01/15/14 0700 01/16/14 0524 01/18/14 0608  Weight: 71.442 kg (157 lb 8 oz) 71.2 kg (156 lb 15.5 oz) 72.349 kg (159 lb 8 oz)    Intake/Output: I/O last 3 completed shifts: In: 1200 [P.O.:600; I.V.:600] Out: 2150 [Urine:2150]   Intake/Output this shift:  Total I/O In: 240 [P.O.:240] Out: 300 [Urine:300]  General appearance: alert, cooperative and no distress  Lungs: clear to auscultation bilaterally  Heart: regular rate and rhythm, S1, S2 normal, no murmur, click, rub or gallop  Abdomen: Soft, non tender, slightly distended, active bowel sounds  Extremities: 1+ pitting edema, packed ulcer on L plantar surface below the large toe  Pulses: 2+ and symmetric    Basic Metabolic Panel:  Recent Labs Lab 01/14/14 0415 01/15/14 0430 01/16/14 0540 01/17/14 0540 01/18/14 0356  NA 141 140 138 139 140  K 3.9 4.4 4.3 4.8 4.6  CL 103 101 101 102 103  CO2 25 26 26 27 27   GLUCOSE 134* 156* 141* 179* 183*  BUN 31* 39* 42* 41* 37*  CREATININE 2.28* 2.53* 2.19* 1.78* 1.74*  CALCIUM 8.4 8.4 8.7 8.5 8.6  PHOS  --  4.7*  --   --   --     Liver Function Tests:  Recent Labs Lab 01/15/14 0430  ALBUMIN 1.5*   No results found for this basename: LIPASE, AMYLASE,  in the last 168 hours No results found for this basename: AMMONIA,  in the last 168 hours  CBC:  Recent Labs Lab 01/15/14 0430 01/15/14 0802 01/16/14 0540 01/17/14 0540 01/18/14 0356  WBC 15.3* 15.1* 15.8* 16.2* 12.8*  HGB 6.7* 7.1* 7.9* 8.3*  8.2*  HCT 20.9* 21.1* 24.3* 26.1* 25.6*  MCV 71.8* 71.8* 73.9* 75.2* 76.6*  PLT 450* 418* 427* 423* 434*    Cardiac Enzymes:  Recent Labs Lab 01/14/14 2130  CKTOTAL 155    BNP: No components found with this basename: POCBNP,   CBG:  Recent Labs Lab 01/17/14 1134 01/17/14 1656 01/17/14 2208 01/18/14 0819 01/18/14 1131  GLUCAP 126* 124* 142* 165* 160*    Microbiology: Results for orders placed during the hospital encounter of 01/06/14  CULTURE, BLOOD (ROUTINE X 2)     Status: None   Collection Time    01/07/14  8:15 AM      Result Value Ref Range Status   Specimen Description BLOOD LEFT ARM   Final   Special Requests BOTTLES DRAWN AEROBIC AND ANAEROBIC 10CC   Final   Culture  Setup Time     Final   Value: 01/07/2014 14:02     Performed at [facility]   Culture     Final   Value: STAPHYLOCOCCUS SPECIES (COAGULASE NEGATIVE)     Note: THE SIGNIFICANCE OF ISOLATING THIS ORGANISM FROM A SINGLE SET OF BLOOD CULTURES WHEN MULTIPLE SETS ARE DRAWN IS UNCERTAIN. PLEASE NOTIFY THE MICROBIOLOGY DEPARTMENT WITHIN ONE WEEK IF SPECIATION AND SENSITIVITIES ARE REQUIRED.     Note: Gram Stain  Report Called to,Read Back By and Verified With: [facility] 01/08/14 @ 5:56PM BY RUSCOE A.     Performed at [facility]   Report Status 01/09/2014 FINAL   Final  CULTURE, BLOOD (ROUTINE X 2)     Status: None   Collection Time    01/07/14  8:30 AM      Result Value Ref Range Status   Specimen Description BLOOD LEFT ARM   Final   Special Requests BOTTLES DRAWN AEROBIC AND ANAEROBIC 10CC   Final   Culture  Setup Time     Final   Value: 01/07/2014 14:02     Performed at [facility]   Culture     Final   Value: NO GROWTH 5 DAYS     Performed at [facility]   Report Status 01/13/2014 FINAL   Final  ANAEROBIC CULTURE     Status: None   Collection Time    01/11/14  8:31 AM      Result Value Ref Range Status   Specimen Description WOUND FOOT LEFT   Final    Special Requests     Final   Value: LEFT PLANTAR ULCER PT ON ZOSYN AND ZYVOX AND AUGMENTIN   Gram Stain     Final   Value: NO WBC SEEN     NO SQUAMOUS EPITHELIAL CELLS SEEN     NO ORGANISMS SEEN     Performed at [facility]   Culture     Final   Value: NO ANAEROBES ISOLATED     Performed at [facility]   Report Status 01/16/2014 FINAL   Final  WOUND CULTURE     Status: None   Collection Time    01/11/14  8:31 AM      Result Value Ref Range Status   Specimen Description WOUND FOOT LEFT   Final   Special Requests LEFT PLANTAR ULCER PT ON ZYVOX,ZOSYN AND AUGMENTIN   Final   Gram Stain     Final   Value: NO WBC SEEN     NO SQUAMOUS EPITHELIAL CELLS SEEN     NO ORGANISMS SEEN     Performed at [facility]   Culture     Final   Value: FEW STAPHYLOCOCCUS AUREUS     Note: RIFAMPIN AND GENTAMICIN SHOULD NOT BE USED AS SINGLE DRUGS FOR TREATMENT OF STAPH INFECTIONS. This organism DOES NOT demonstrate inducible Clindamycin resistance in vitro.     Performed at [facility]   Report Status 01/14/2014 FINAL   Final   Organism ID, Bacteria STAPHYLOCOCCUS AUREUS   Final  CULTURE, ROUTINE-ABSCESS     Status: None   Collection Time    01/17/14  8:17 PM      Result Value Ref Range Status   Specimen Description ABSCESS FOOT LEFT   Final   Special Requests PATIENT ON FOLLOWING KEFZOL   Final   Gram Stain     Final   Value: ABUNDANT WBC PRESENT, PREDOMINANTLY PMN     NO SQUAMOUS EPITHELIAL CELLS SEEN     NO ORGANISMS SEEN     Performed at [facility]   Culture     Final   Value: NO GROWTH     Performed at [facility]   Report Status PENDING   Incomplete  ANAEROBIC CULTURE     Status: None   Collection Time    01/17/14  8:17 PM      Result Value  Ref Range Status   Specimen Description ABSCESS FOOT LEFT   Final   Special Requests PATIENT ON FOLLOWING KEFZOL   Final   Gram Stain     Final   Value: ABUNDANT WBC PRESENT, PREDOMINANTLY PMN      NO SQUAMOUS EPITHELIAL CELLS SEEN     NO ORGANISMS SEEN     Performed at [facility]   Culture     Final   Value: NO ANAEROBES ISOLATED; CULTURE IN PROGRESS FOR 5 DAYS     Performed at [facility]   Report Status PENDING   Incomplete    Coagulation Studies:  Recent Labs  01/16/14 0540 01/17/14 0540 01/18/14 0356  LABPROT 20.7* 20.8* 20.7*  INR 1.84* 1.85* 1.84*    Urinalysis: No results found for this basename: COLORURINE, APPERANCEUR, LABSPEC, PHURINE, GLUCOSEU, HGBUR, BILIRUBINUR, KETONESUR, PROTEINUR, UROBILINOGEN, NITRITE, LEUKOCYTESUR,  in the last 72 hours    Imaging: No results found.   Medications:   . heparin 1,500 Units/hr (01/18/14 0328)   . carvedilol  12.5 mg Oral BID WC  . cephALEXin  500 mg Oral 3 times per day  . ferrous sulfate  325 mg Oral TID WC  . hydrALAZINE  50 mg Oral 3 times per day  . insulin aspart  0-9 Units Subcutaneous TID WC  . isosorbide mononitrate  30 mg Oral Daily  . pantoprazole  20 mg Oral QAC breakfast  . verapamil  120 mg Oral Daily   HYDROcodone-acetaminophen, HYDROmorphone (DILAUDID) injection, morphine injection, ondansetron (ZOFRAN) IV, sodium chloride  Assessment/ Plan:  Pt is a very pleasant 63yo [place] male who was admitted on 01/06/14 with diabetic foot infection and started empirically on Abx. He has developed worsening lower ext edema and the heart failure team was consulted and ordered IV diuretics, his Scr has risen since admissionHe was also recently diagnosed with acute DVT of both legs by duplex and is currently on coumadin.  nephrotic range proteinuria dating back to at least 11/13 . He was found to have hepatitis C positive serologies   Nephrotic range proteinuria. 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 Renal ultrasound no hydronephrosis  Acute renal failure creatinine improving   There appears to be no indications for a  renal biopsy. Serologies  Negative ANCA testing. No evidence of lupus.  Will sign off   Recommend home dose of lasix be continued as outpatient . No ACE or ARB. Patient can follow up at the internal medicine clinic. Nephrology follow up as needed     LOS: 12 Neah Sporrer W @TODAY @1 :31 PM

## 2014-01-19 ENCOUNTER — Inpatient Hospital Stay (HOSPITAL_COMMUNITY): Payer: PRIVATE HEALTH INSURANCE | Admitting: Rehabilitation

## 2014-01-19 ENCOUNTER — Encounter (HOSPITAL_COMMUNITY): Payer: PRIVATE HEALTH INSURANCE | Admitting: Occupational Therapy

## 2014-01-19 ENCOUNTER — Inpatient Hospital Stay (HOSPITAL_COMMUNITY): Payer: PRIVATE HEALTH INSURANCE | Admitting: Occupational Therapy

## 2014-01-19 DIAGNOSIS — I10 Essential (primary) hypertension: Secondary | ICD-10-CM

## 2014-01-19 LAB — CBC
HCT: 24.9 % — ABNORMAL LOW (ref 39.0–52.0)
Hemoglobin: 7.8 g/dL — ABNORMAL LOW (ref 13.0–17.0)
MCH: 24.1 pg — ABNORMAL LOW (ref 26.0–34.0)
MCHC: 31.3 g/dL (ref 30.0–36.0)
MCV: 76.9 fL — ABNORMAL LOW (ref 78.0–100.0)
PLATELETS: 412 10*3/uL — AB (ref 150–400)
RBC: 3.24 MIL/uL — ABNORMAL LOW (ref 4.22–5.81)
RDW: 19.2 % — ABNORMAL HIGH (ref 11.5–15.5)
WBC: 14 10*3/uL — AB (ref 4.0–10.5)

## 2014-01-19 LAB — GLUCOSE, CAPILLARY
Glucose-Capillary: 161 mg/dL — ABNORMAL HIGH (ref 70–99)
Glucose-Capillary: 165 mg/dL — ABNORMAL HIGH (ref 70–99)
Glucose-Capillary: 169 mg/dL — ABNORMAL HIGH (ref 70–99)
Glucose-Capillary: 188 mg/dL — ABNORMAL HIGH (ref 70–99)

## 2014-01-19 LAB — BASIC METABOLIC PANEL
BUN: 34 mg/dL — ABNORMAL HIGH (ref 6–23)
CHLORIDE: 101 meq/L (ref 96–112)
CO2: 26 mEq/L (ref 19–32)
Calcium: 8.4 mg/dL (ref 8.4–10.5)
Creatinine, Ser: 1.57 mg/dL — ABNORMAL HIGH (ref 0.50–1.35)
GFR calc non Af Amer: 46 mL/min — ABNORMAL LOW (ref >90)
GFR, EST AFRICAN AMERICAN: 53 mL/min — AB (ref >90)
Glucose, Bld: 200 mg/dL — ABNORMAL HIGH (ref 70–99)
POTASSIUM: 4.7 meq/L (ref 3.7–5.3)
SODIUM: 138 meq/L (ref 137–147)

## 2014-01-19 LAB — HEPARIN LEVEL (UNFRACTIONATED): Heparin Unfractionated: 0.3 IU/mL (ref 0.30–0.70)

## 2014-01-19 LAB — FACTOR 5 LEIDEN

## 2014-01-19 LAB — PROTIME-INR
INR: 1.78 — AB (ref 0.00–1.49)
Prothrombin Time: 20.2 seconds — ABNORMAL HIGH (ref 11.6–15.2)

## 2014-01-19 MED ORDER — PANTOPRAZOLE SODIUM 20 MG PO TBEC: 20.0000 mg | DELAYED_RELEASE_TABLET | Freq: Every day | ORAL | Status: DC

## 2014-01-19 MED ORDER — FERROUS SULFATE 325 (65 FE) MG PO TABS: 325.0000 mg | ORAL_TABLET | Freq: Three times a day (TID) | ORAL | Status: DC

## 2014-01-19 MED ORDER — WARFARIN SODIUM 5 MG PO TABS
5.0000 mg | ORAL_TABLET | Freq: Once | ORAL | Status: AC
  Administered 2014-01-19: 5 mg via ORAL
  Filled 2014-01-19: qty 1

## 2014-01-19 MED ORDER — ISOSORBIDE MONONITRATE ER 30 MG PO TB24: 30.0000 mg | ORAL_TABLET | Freq: Every day | ORAL | Status: DC

## 2014-01-19 MED ORDER — INSULIN ASPART 100 UNIT/ML ~~LOC~~ SOLN: 0.0000 [IU] | Freq: Three times a day (TID) | SUBCUTANEOUS | Status: DC

## 2014-01-19 MED ORDER — CARVEDILOL 12.5 MG PO TABS: 12.5000 mg | ORAL_TABLET | Freq: Two times a day (BID) | ORAL | Status: DC

## 2014-01-19 MED ORDER — HYDRALAZINE HCL 50 MG PO TABS: 50.0000 mg | ORAL_TABLET | Freq: Three times a day (TID) | ORAL | Status: DC

## 2014-01-19 MED ORDER — WARFARIN - PHARMACIST DOSING INPATIENT: Freq: Every day | Status: DC

## 2014-01-19 MED ORDER — CEPHALEXIN 500 MG PO CAPS: 500.0000 mg | ORAL_CAPSULE | Freq: Three times a day (TID) | ORAL | Status: AC

## 2014-01-19 MED ORDER — VERAPAMIL HCL ER 120 MG PO TBCR: 180.0000 mg | EXTENDED_RELEASE_TABLET | Freq: Every day | ORAL | Status: DC

## 2014-01-19 MED ORDER — HEPARIN (PORCINE) IN NACL 100-0.45 UNIT/ML-% IJ SOLN: 1500.0000 [IU]/h | INTRAMUSCULAR | Status: DC

## 2014-01-19 MED ORDER — HEPARIN (PORCINE) IN NACL 100-0.45 UNIT/ML-% IJ SOLN
1500.0000 [IU]/h | INTRAMUSCULAR | Status: DC
  Administered 2014-01-19 – 2014-01-21 (×4): 1500 [IU]/h via INTRAVENOUS
  Filled 2014-01-19 (×5): qty 250

## 2014-01-19 NOTE — Discharge Instructions (Addendum)
·   Thank you for allowing [place] to be involved in your healthcare while you were hospitalized at [facility].   Please note that there have been changes to your home medications.  --> PLEASE LOOK AT YOUR DISCHARGE MEDICATION LIST FOR DETAILS.   Please call your PCP if you have any questions or concerns, or any difficulty getting any of your medications.  Please return to the ER if you have worsening of your symptoms or new severe symptoms arise.      Left foot diabetic ulcer dressing change to be completed every morning and as needed with the following steps: 1) Remove dressing and packing within wound 2) Wash with soap and water 3) Repack wound with 4x4 gauze, one on dorsal aspect of foot wound and one on the plantar aspect of the foot wound 4) Redress wound with curlex gauze and ace wrap insuring to leave heel unwrapped but to include posterior ankle within wrap.  5) Continue use of post op shoe during ambulation

## 2014-01-19 NOTE — Progress Notes (Signed)
I have contacted [facility] [employer] resources and pt did not enroll for new Medical insurance coverage for 2015 so pt is uninsured. I have informed pt, his cousin, RN CM, and SW. Pt and cousin therefore wish to pursue SNF placement due to cost of inpt rehab. I will also notify financial counselor for possibility of disability and Medicaid applications. We will sign off. Please call me for any questions. NW:[phone]

## 2014-01-19 NOTE — Progress Notes (Signed)
Medical Student Daily Progress Note  Subjective: Patient had one episode of L foot surgery site bleeding last evening. Heparin and Warfarin were held overnight, and pressure was applied to foot. L foot is no longer bleeding in AM.  No change in shortness of breath, but still on 2L oxygen. According to nurse, patient saturates at 88% while sitting, but saturation increased to 92% while taking deep breaths. PT reports that patient desaturates to 82% while walking, and saturates at 88% when taking a rest. Patient even desaturated to 84% while trying to put on shoes.  Objective: Vital signs in last 24 hours: Filed Vitals:   01/18/14 1208 01/18/14 1221 01/18/14 2107 01/19/14 0453  BP:   121/70 122/73  Pulse: 86 91 71 78  Temp:   97.8 F (36.6 C) 98 F (36.7 C)  TempSrc:   Oral Oral  Resp:   18 18  Height:      Weight:    72.4 kg (159 lb 9.8 oz)  SpO2: 87% 91% 98% 99%   Weight change: 0.051 kg (1.8 oz)  Intake/Output Summary (Last 24 hours) at 01/19/14 1128 Last data filed at 01/19/14 0900  Gross per 24 hour  Intake   1080 ml  Output   1300 ml  Net   -220 ml   Physical Exam: BP 122/73  Pulse 73  Temp(Src) 98 F (36.7 C) (Oral)  Resp 18  Ht '5\' 6"'  (1.676 m)  Wt 72.4 kg (159 lb 9.8 oz)  BMI 25.77 kg/m2  SpO2 98% General appearance: appears to be mild respiratory distress, but this has not changed much since his over the past several days. Lungs: clear to auscultation bilaterally Heart: Regulate rate, still has a faint S4 vs split S1, no murmurs Abdomen: soft, non-tender; bowel sounds normal; no masses,  no organomegaly Extremities: 2+ lower extremity edema to bilateral shins Pulses: 2+ and symmetric Lab Results: '@labtest2' @ Micro Results: Recent Results (from the past 240 hour(s))  ANAEROBIC CULTURE     Status: None   Collection Time    01/11/14  8:31 AM      Result Value Ref Range Status   Specimen Description WOUND FOOT LEFT   Final   Special Requests     Final    Value: LEFT PLANTAR ULCER PT ON ZOSYN AND ZYVOX AND AUGMENTIN   Gram Stain     Final   Value: NO WBC SEEN     NO SQUAMOUS EPITHELIAL CELLS SEEN     NO ORGANISMS SEEN     Performed at [facility]   Culture     Final   Value: NO ANAEROBES ISOLATED     Performed at [facility]   Report Status 01/16/2014 FINAL   Final  WOUND CULTURE     Status: None   Collection Time    01/11/14  8:31 AM      Result Value Ref Range Status   Specimen Description WOUND FOOT LEFT   Final   Special Requests LEFT PLANTAR ULCER PT ON ZYVOX,ZOSYN AND AUGMENTIN   Final   Gram Stain     Final   Value: NO WBC SEEN     NO SQUAMOUS EPITHELIAL CELLS SEEN     NO ORGANISMS SEEN     Performed at [facility]   Culture     Final   Value: FEW STAPHYLOCOCCUS AUREUS     Note: RIFAMPIN AND GENTAMICIN SHOULD NOT BE USED AS SINGLE DRUGS FOR TREATMENT OF STAPH INFECTIONS.  This organism DOES NOT demonstrate inducible Clindamycin resistance in vitro.     Performed at [facility]   Report Status 01/14/2014 FINAL   Final   Organism ID, Bacteria STAPHYLOCOCCUS AUREUS   Final  CULTURE, ROUTINE-ABSCESS     Status: None   Collection Time    01/17/14  8:17 PM      Result Value Ref Range Status   Specimen Description ABSCESS FOOT LEFT   Final   Special Requests PATIENT ON FOLLOWING KEFZOL   Final   Gram Stain     Final   Value: ABUNDANT WBC PRESENT, PREDOMINANTLY PMN     NO SQUAMOUS EPITHELIAL CELLS SEEN     NO ORGANISMS SEEN     Performed at [facility]   Culture     Final   Value: Culture reincubated for better growth     Performed at [facility]   Report Status PENDING   Incomplete  ANAEROBIC CULTURE     Status: None   Collection Time    01/17/14  8:17 PM      Result Value Ref Range Status   Specimen Description ABSCESS FOOT LEFT   Final   Special Requests PATIENT ON FOLLOWING KEFZOL   Final   Gram Stain     Final   Value: ABUNDANT WBC PRESENT, PREDOMINANTLY PMN      NO SQUAMOUS EPITHELIAL CELLS SEEN     NO ORGANISMS SEEN     Performed at [facility]   Culture     Final   Value: NO ANAEROBES ISOLATED; CULTURE IN PROGRESS FOR 5 DAYS     Performed at [facility]   Report Status PENDING   Incomplete   Studies/Results: No results found. Medications: I have reviewed the patient's current medications. Scheduled Meds: . carvedilol  12.5 mg Oral BID WC  . cephALEXin  500 mg Oral 3 times per day  . ferrous sulfate  325 mg Oral TID WC  . hydrALAZINE  50 mg Oral 3 times per day  . insulin aspart  0-9 Units Subcutaneous TID WC  . isosorbide mononitrate  30 mg Oral Daily  . pantoprazole  20 mg Oral QAC breakfast  . verapamil  120 mg Oral Daily   Continuous Infusions: . heparin     PRN Meds:.HYDROcodone-acetaminophen, HYDROmorphone (DILAUDID) injection, morphine injection, ondansetron (ZOFRAN) IV, sodium chloride Assessment/Plan: Principal Problem:   Acute on chronic diastolic heart failure Active Problems:   Hypertension   Diabetes mellitus with renal manifestation   Hypoalbuminemia   Diabetic nephropathy with proteinuria   Cellulitis   Acute respiratory failure   New onset seizure   Acute renal failure   Acute acalculous cholecystitis   Elevated alkaline phosphatase level   Microcytic anemia   DVT of popliteal vein   Possible GI bleeding   Nephrotic syndrome   Anasarca   LOS: 13 days   Joshua [name] is a 63yo man with history of T2DM, HTN, L foot ulcer, previously treated extrapulmonary TB who has returned to IMTS from ICU with sepsis possibly due to acalculous cholecystitis. The most active issues include dCHF, AKI on CKD with nephrotic syndrome, and possible GIB.   # AKI in presence of nephrotic syndrome: Baseline Cr of 0.8. In the setting of nephrotic syndrome and dCHF. AKI is most likely caused by intra-renal causes as aggressive diuresis has not improved kidney functions. Patient has been diagnosed with diabetic  nephropathy, and it is the most likely cause of patient's  nephropathy syndrome and current cause of AKI. Hep C Ab is reactive, but has non-detectable HCV viral load, indicating resolution, and unlikely to contribute to his renal disease. Renal scan shows normal-sized kidneys, but notes increased parenchymal echogenicity, consistent with medical renal disease. No evidence of hydronephrosis. Patient is already on anticoagulation, thus RVT scan is not needed because it will not change management. Studies does not show that patient has lupus or hypercoagulable states. Initially contemplated renal biopsy but this may not be required. Patient will be followed on an outpatient basis.  Plan: - Nephrology recs appreciated  - Hold off ACEI until AKI resolves   - Restart heparin and warfarin on 2/26 - once disposition issues and oxygen needs are sorted out, patient can be discharged to CIR or SNF.  # dCHF: Echo with EF of 55%, and grade 2 diastolic dysfunction on ECHO. He still have excess volume on board and he is symptomatic with dyspnea and requiring supplementary O2 therapy. Patient will likely need oxygen when returning home give how his oxygen saturation is in the low 80's with activity. Plan  - off lasix now as recommended by renal  - Plan for home oxygen due to patient's need  - Verapamil 17m daily  - Coreg 12.5 mg bid mainly for BP control.  - hold off ACEI with AKI  - Will aim for negative or even fluid balance. Still making urine but +3.9L up and auto-diuresing  # Diabetic foot ulcer: MRI of L foot did not show osteomyelitis. L foot received irrigation and debridement on 2/23, and a significant amount of puss was found in the entire L forefoot. Puss was drained and a drain was placed through the foot. Ortho states that amputation is likely needed in the future. Wound culture showed some Staph species and no anaerobes. ABI on left shows left pedal area with pressure >200, adequate for digital flow  to support healing.  Plan: -Cephlaxin should cover staph species in wound, and will consider a longer duration of antibiotics (change 2/26 to 3/1) given the severity of foot ulcer found during I&D. -Wound packing to prevent abscess  - anticoagulants restarted this am. If bleeding recurs will hold coumadin and heparin. We can cont with pressure dressing for now.  # Iron-deficient anemia vs anemia of chronic disease: Low normal hemoglobin. Today at 8.3. Hemoglobin of 8.2 in 2015, but 13.9 in 2010. Iron panel shows low iron stores (Iron 19, Ferritin 179), and inappropriately low retic index (0.2). FOBT is positive x2. No colonoscopy on file, but there was 3 negative FOBT in 10/2012. GI recommends air- barium enema and an upper GI series for peptic ulcers as outpatient as he is no a candidate for scoping due to clinical status. Transfused with one 1 unit of PRBCs. AIHA not suspected at this time given normal bilirubin.  Plan  - postransfusion Hbg 7.9 on 2/22. Goal above 8 given his co-morbidities.  - Protonix 22mPO qD: upper GI bleed and RUQ pain after eating  - Ferrous sulfate 32564mablet TID - will be discharged on this medication and followed up as outpatient   # HTN: still somehow elevated today. Goal pressure for proteinuric CKD is <130/80.  Plan  - Continue with hydralazine to 26m61mD.  - Verapamil 120mg25m - increased Carvedilol to 12.5mg B85m - Isosorbide dinitrate 30 mg daily   # Acute DVT: Patient was found to have bilateral acute DVT without PE. Therapeutic on heparin with UFH of 0.44 and  goal of 0.3-0.7. Supra therapeutic on Warfarin with INR of 3.92. Anticoagulation team following. If patient undergoes surgery, anticoagulation team will be notified to manage anticoagulation. He require anticoagulants for three months for first time symptomatic acute DVT .  - Restart anticoagulation on 2/26  # Septic shock likely due to acalculous cholecystitis: Acalculous cholecystitis was found  on RUQ ultrasound with gallbladder wall thickening of 1.1cm with chronically elevated alk phos and GGT ( in 2013, in 2015) indicating liver as source of elevation in alk phos. AST and ALT are now normal. TBili and DBili remained normal. 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 CBC is still showing leukocytosis. Other causes for sepsis that are ruled out are systemic bacteremia (only 1 out of 2 cultures with coagulase negative staph) and osteomyelitis (MRI does not show osteomyelitis). Patient was previously evaluated by surgery team, who recommended a HIDA scan. This did not show any hepatobiliary abnormalities. Outpatient followup for cholecystectomy was recommended once patient is medically stable.   # T2DM: CBG  - Hold Lantus until patient is no longer NPO, consider starting Lantus 10 units with breakfast as glucose during the day is elevated, but fasting glucose is around 120.  - SSI   # Seizure: resolved issue. Most likely seizures were induced by acute decompensation prior to his ICU course. EEG showed medication effect. Off keppra now.   # Hypoalbuminemia:  Patient has albumin of 1.7 with protein gap of 5.3 from total protein. Patient has a baseline hypoalbuminemia. However, UPEP showed elevated kappa and lambda free light chains, but the kappa/lambda ratio is normal (4). SPEP did not show M spike. We are less concerned for paraprotein phenomenon even though patient has anemia, AKI, and elevated alkaline phosphate. Most likely cause is nephrotic syndrome. Patient does not appear to have liver disease given normal liver appearance on ultrasound and normal AST/ALT, and the acalculous cholecystitis can explain the elevated alk phos level.   Proph:  DVT: already on UFH  Mobility: PT and OT request rehab consult for patient. Patient requires walker    Dispo: patient does not have insurance and will be going to SNF. Social worker is working to help patient find financial support.     This is a Careers information officer Note.  The care of the patient was discussed with Dr. Linus Salmons and the assessment and plan formulated with their assistance.  Please see their attached note for official documentation of the daily encounter.  Joshua [name] 01/19/2014, 11:28 AM   I have seen the patient and reviewed the daily progress note by Elvin So MS IV and discussed the care of the patient with them.  I agree with her documentation as above. Signed:  Jessee Avers, MD PGY-2 Internal Medicine Teaching Service Pager: [phone]

## 2014-01-19 NOTE — Progress Notes (Signed)
Physical Therapy Treatment Patient Details Name: Joshua [name] MRN: [id number] DOB: 01-18-1951 Today's Date: 01/19/2014 Time: 1120-1220 PT Time Calculation (min): 60 min  PT Assessment / Plan / Recommendation  History of Present Illness 62 y.o. male admitted to [facility] on 01/06/14 with DM2, previous treatment (abbreviated) for extrapulmonary TB admitted with worseing foot ulcer with increasing pain, chills.  He had a foot ulcer plantar surface first metatarsal head left foot.  His hospital course was complicated by bradycardia, hypotension, seizure in the ICU.  The pt was intubated 01/07/14-01/08/14.  He was found to have bil peroneal DVTs, bil pleural effusions on chest CT (neg for PE), abdominal US revealed acute acalculous cholecystitis, and MRI of the foot showed no osteo, diffuse cellulitis, myositis, small 1st MTP joint effusion that is likely aseptic and incidental, osteonecrosis of first MTP joint.  Ortho preformed a bedside aspiration of the wound on 01/11/14.  Ortho recommends WBAT in post-op shoe.  S/p I&D L foot 12/17/13.     PT Comments   Pt continues to make slow, but steady progress with mobility.  We continue to attempt mobility without supplemental O2 use without success (see details below).  He continues to be very deconditioned, very edemous in bil legs and very slow to move.  He is an excellent inpatient rehab candidate.    Follow Up Recommendations  CIR     Does the patient have the potential to tolerate intense rehabilitation    Yes  Barriers to Discharge   None      Equipment Recommendations  Rolling walker with 5" wheels    Recommendations for Other Services   None  Frequency Min 3X/week   Progress towards PT Goals Progress towards PT goals: Progressing toward goals  Plan Current plan remains appropriate    Precautions / Restrictions Precautions Precautions: Fall;Other (comment) Precaution Comments: pt is very deconditioned and weak.  Desats with activity as of  01/18/14 Required Braces or Orthoses: Other Brace/Splint Other Brace/Splint: post op shoe, left foot Restrictions LLE Weight Bearing: Weight bearing as tolerated   Pertinent Vitals/Pain Pt continues to desat on RA during gait/mobility.  In room on RA he slowly drops down to the mid to upper 80s, but the second he starts to do something effortful (try to put his shoe on by himself) he drops to the low 80s (82% on RA).  During gait he also drops to the mid to low 80s with DOE 1/4 82-88% on RA.  He is able to increase his O2 to 90% with standing rest break and pursed lip breathing, but cannot sustain this level while he walks. 2.5 L O2 [place] returned to nose after session    Mobility  Transfers Overall transfer level: Needs assistance Equipment used: Rolling walker (2 wheeled) Transfers: Sit to/from Stand Sit to Stand: Min assist General transfer comment: Min assist to support trunk during transitions to stand.  Reinforced safe hand placement. Donned right tennis shoe prior to getting up.   Ambulation/Gait Ambulation/Gait assistance: Min assist Ambulation Distance (Feet): 150 Feet Assistive device: Rolling walker (2 wheeled) Gait Pattern/deviations: Step-to pattern;Decreased stride length;Shuffle;Trunk flexed Gait velocity: decreased Gait velocity interpretation: <1.8 ft/sec, indicative of risk for recurrent falls General Gait Details: Pt with extremely slow gait pattern, stopping to take a pursed lip breath with every step.  Verbal cues for upright posture.  Pt did better with tennis shoe on right foot to balance out height difference created by post-op shoe.     Exercises General Exercises -  Upper Extremity Shoulder Flexion: AROM;Both;10 reps;Seated Elbow Flexion: AROM;Both;10 reps;Seated General Exercises - Lower Extremity Long Arc Quad: AROM;Both;10 reps;Seated Hip ABduction/ADduction: AROM;Both;10 reps;Seated Hip Flexion/Marching: AROM;Both;10 reps;Seated Toe Raises: AROM;Both;10  reps;Seated Heel Raises: AROM;Both;10 reps;Seated     PT Goals (current goals can now be found in the care plan section) Acute Rehab PT Goals Patient Stated Goal: eventually to go home and be independent again  Visit Information  Last PT Received On: 01/19/14 Assistance Needed: +1 History of Present Illness: 64 y.o. male admitted to [facility] on 01/06/14 with DM2, previous treatment (abbreviated) for extrapulmonary TB admitted with worseing foot ulcer with increasing pain, chills.  He had a foot ulcer plantar surface first metatarsal head left foot.  His hospital course was complicated by bradycardia, hypotension, seizure in the ICU.  The pt was intubated 01/07/14-01/08/14.  He was found to have bil peroneal DVTs, bil pleural effusions on chest CT (neg for PE), abdominal US revealed acute acalculous cholecystitis, and MRI of the foot showed no osteo, diffuse cellulitis, myositis, small 1st MTP joint effusion that is likely aseptic and incidental, osteonecrosis of first MTP joint.  Ortho preformed a bedside aspiration of the wound on 01/11/14.  Ortho recommends WBAT in post-op shoe.  S/p I&D L foot 12/17/13.      Subjective Data  Subjective: Pt continues to report he doesn't have pain in his left foot.  Patient Stated Goal: eventually to go home and be independent again   Cognition  Cognition Arousal/Alertness: Awake/alert Behavior During Therapy: WFL for tasks assessed/performed Overall Cognitive Status: Within Functional Limits for tasks assessed    Balance  Balance Standing balance support: Bilateral upper extremity supported Standing balance-Leahy Scale: Poor General Comments General comments (skin integrity, edema, etc.): Pt continues to desat on RA during gait/mobility.  In room on RA he slowly drops down to the mid to upper 80s, but the second he starts to do something effortful (try to put his shoe on by himself) he drops to the low 80s (82% on RA).  During gait he also drops to the mid to  low 80s with DOE 1/4 82-88% on RA.  He is able to increase his O2 to 90% with standing rest break and pursed lip breathing, but cannot sustain this level while he walks. 2.5 L O2 [place] returned to nose after session.    End of Session PT - End of Session Equipment Utilized During Treatment: Gait belt Activity Tolerance: Patient limited by fatigue Patient left: in chair;with call bell/phone within reach Nurse Communication: Mobility status;Other (comment) (O2 sats drop with mobility)     Jayanna Kroeger B. [place], [place], DPT [phone]   01/19/2014, 2:25 PM

## 2014-01-19 NOTE — Progress Notes (Signed)
On call MD made aware of HR 36 with 2.6 sec pause. Pt sitting up in chair , b/p116/62, asymptomatic. Will continue monitoring.

## 2014-01-19 NOTE — Progress Notes (Signed)
CSW left Message with [facility] to see if they will take a patient with an LOG. CSW will continue to follow.   Rhea Pink, MSW, [place]

## 2014-01-19 NOTE — Progress Notes (Signed)
Subjective: 2 Days Post-Op Procedure(s) (LRB): IRRIGATION AND DEBRIDEMENT  LEFT FOOT (Left) Patient doing well this afternoon, pain well controlled, progressing well with physical therapy.  C/o SOB.  Pt denies N/V/F/C, chest pain, or changes in appetite.  Objective: Vital signs in last 24 hours: Temp:  [97.8 F (36.6 C)-98 F (36.7 C)] 98 F (36.7 C) (02/25 0453) Pulse Rate:  [71-78] 73 (02/25 1128) Resp:  [18] 18 (02/25 0453) BP: (121-122)/(70-73) 122/73 mmHg (02/25 0453) SpO2:  [98 %-99 %] 98 % (02/25 1128) Weight:  [72.4 kg (159 lb 9.8 oz)] 72.4 kg (159 lb 9.8 oz) (02/25 0453)  Intake/Output from previous day: 02/24 0701 - 02/25 0700 In: 1080 [P.O.:960; I.V.:120] Out: 1600 [Urine:1600] Intake/Output this shift: Total I/O In: 240 [P.O.:240] Out: -    Recent Labs  01/17/14 0540 01/18/14 0356 01/19/14 0430  HGB 8.3* 8.2* 7.8*    Recent Labs  01/18/14 0356 01/19/14 0430  WBC 12.8* 14.0*  RBC 3.34* 3.24*  HCT 25.6* 24.9*  PLT 434* 412*    Recent Labs  01/18/14 0356 01/19/14 0430  NA 140 138  K 4.6 4.7  CL 103 101  CO2 27 26  BUN 37* 34*  CREATININE 1.74* 1.57*  GLUCOSE 183* 200*  CALCIUM 8.6 8.4    Recent Labs  01/18/14 0356 01/19/14 0430  INR 1.84* 1.78*    NAD, A/Ox3, appears stated age.  EOMI, mood and affect normal, respirations unlabored. On physical exam dressing in proper placement, saturation to deep dressing.  +blood discharge, no purulent d/c noted.  Toes somewhat mobile and perfused.  Cap refill <5sec.  Decreased sensation to light touch.  Penrose drain and 4x4 packing noted in plantar ulcer post washout. Open incision noted to the dorsal forefoot at the webspace between the 1st and 2nd metatarsal heads.  Penrose drain and packing removed and repacked using 4x4 gauze.  Re-dressed with ABD and curlex with ace wrap.  DP pulse 1+ bilaterally.   Assessment/Plan: 2 Days Post-Op Procedure(s) (LRB): IRRIGATION AND DEBRIDEMENT  LEFT FOOT  (Left) Restart Heparin for b/l DVT.  Continue antibiotics.  Continue PT and ambulation with post op shoe to left foot.  Continue dressing changes qday. F/u with Dr. Doran Durand in 2 weeks after discharge from hospital.   Trula Ore 01/19/2014, 1:54 PM

## 2014-01-19 NOTE — Progress Notes (Signed)
[place] for heparin>>warfarin Indication: DVT  No Known Allergies  Patient Measurements: Height: 5\' 6"  (167.6 cm) Weight: 159 lb 9.8 oz (72.4 kg) IBW/kg (Calculated) : 63.8 Heparin Dosing Weight: 71kg  Vital Signs: Temp: 98 F (36.7 C) (02/25 0453) Temp src: Oral (02/25 0453) BP: 122/73 mmHg (02/25 0453) Pulse Rate: 73 (02/25 1128)  Labs:  Recent Labs  01/16/14 1725  01/17/14 0540 01/18/14 0356 01/18/14 1140 01/19/14 0430  HGB  --   < > 8.3* 8.2*  --  7.8*  HCT  --   --  26.1* 25.6*  --  24.9*  PLT  --   --  423* 434*  --  412*  LABPROT  --   --  20.8* 20.7*  --  20.2*  INR  --   --  1.85* 1.84*  --  1.78*  HEPARINUNFRC 0.47  --  0.73*  --  0.44  --   CREATININE  --   --  1.78* 1.74*  --  1.57*  < > = values in this interval not displayed.  Estimated Creatinine Clearance: 44 ml/min (by C-G formula based on Cr of 1.57).  Assessment: 62 YOM on heparin bridging to warfarin Patient s/p I&D 2/23, INR subtherapeutic and trending down from yesterday at 1.7.   Patient noted to have bleeding last night and anticoagulation was stopped but not before patient had already received his warfarin. Bleeding is now resolved, orders to restart his heparin and warfarin this morning, will not bolus heparin given elevated INR and recent bleed.  Goal of Therapy:  INR goal 2-3 Heparin level 0.3-0.7 units/ml Monitor platelets by anticoagulation protocol: Yes   Plan:  1. Restart heparin at 1500 units/hr - will check heparin level this afternoon 2. Daily HL, INR, and CBC 3. Follow for s/s bleeding 4. Warfarin 5mg  tonight  Erin Hearing PharmD., BCPS Clinical Pharmacist Pager [phone] 01/19/2014 12:57 PM

## 2014-01-19 NOTE — Progress Notes (Signed)
Joshua [name] for heparin>>warfarin Indication: DVT  No Known Allergies  Patient Measurements: Height: 5\' 6"  (167.6 cm) Weight: 159 lb 9.8 oz (72.4 kg) IBW/kg (Calculated) : 63.8 Heparin Dosing Weight: 71kg  Vital Signs: Temp: 97.8 F (36.6 C) (02/25 1600) Temp src: Oral (02/25 1600) BP: 116/62 mmHg (02/25 1700) Pulse Rate: 61 (02/25 1600)  Labs:  Recent Labs  01/17/14 0540 01/18/14 0356 01/18/14 1140 01/19/14 0430 01/19/14 1816  HGB 8.3* 8.2*  --  7.8*  --   HCT 26.1* 25.6*  --  24.9*  --   PLT 423* 434*  --  412*  --   LABPROT 20.8* 20.7*  --  20.2*  --   INR 1.85* 1.84*  --  1.78*  --   HEPARINUNFRC 0.73*  --  0.44  --  0.30  CREATININE 1.78* 1.74*  --  1.57*  --     Estimated Creatinine Clearance: 44 ml/min (by C-G formula based on Cr of 1.57).  Assessment: 62 YOM on heparin bridging to warfarin Patient s/p I&D 2/23, INR subtherapeutic and trending down from yesterday at 1.7.   Patient noted to have bleeding last night and heparin was stopped.  Bleeding is now resolved and heparin restarted this am.  Heparin drip 1500 uts/hr HL 0.3.    Goal of Therapy:  INR goal 2-3 Heparin level 0.3-0.7 units/ml Monitor platelets by anticoagulation protocol: Yes   Plan:  Continue Heparin drip 1500 uts/hr  Daily HL  Bonnita Nasuti Pharm.D. CPP, BCPS Clinical Pharmacist [phone] 01/19/2014 7:57 PM

## 2014-01-20 ENCOUNTER — Inpatient Hospital Stay (HOSPITAL_COMMUNITY): Payer: PRIVATE HEALTH INSURANCE

## 2014-01-20 LAB — HEPARIN LEVEL (UNFRACTIONATED): HEPARIN UNFRACTIONATED: 0.38 [IU]/mL (ref 0.30–0.70)

## 2014-01-20 LAB — COMPREHENSIVE METABOLIC PANEL
ALBUMIN: 1.7 g/dL — AB (ref 3.5–5.2)
ALK PHOS: 374 U/L — AB (ref 39–117)
ALT: 56 U/L — AB (ref 0–53)
AST: 44 U/L — ABNORMAL HIGH (ref 0–37)
BUN: 36 mg/dL — ABNORMAL HIGH (ref 6–23)
CO2: 26 mEq/L (ref 19–32)
Calcium: 8.6 mg/dL (ref 8.4–10.5)
Chloride: 102 mEq/L (ref 96–112)
Creatinine, Ser: 1.53 mg/dL — ABNORMAL HIGH (ref 0.50–1.35)
GFR calc Af Amer: 55 mL/min — ABNORMAL LOW (ref >90)
GFR calc non Af Amer: 47 mL/min — ABNORMAL LOW (ref >90)
GLUCOSE: 196 mg/dL — AB (ref 70–99)
POTASSIUM: 5 meq/L (ref 3.7–5.3)
SODIUM: 139 meq/L (ref 137–147)
Total Bilirubin: 0.3 mg/dL (ref 0.3–1.2)
Total Protein: 7.3 g/dL (ref 6.0–8.3)

## 2014-01-20 LAB — CBC
HEMATOCRIT: 24.5 % — AB (ref 39.0–52.0)
Hemoglobin: 7.7 g/dL — ABNORMAL LOW (ref 13.0–17.0)
MCH: 24.2 pg — ABNORMAL LOW (ref 26.0–34.0)
MCHC: 31.4 g/dL (ref 30.0–36.0)
MCV: 77 fL — ABNORMAL LOW (ref 78.0–100.0)
PLATELETS: 414 10*3/uL — AB (ref 150–400)
RBC: 3.18 MIL/uL — ABNORMAL LOW (ref 4.22–5.81)
RDW: 19.4 % — ABNORMAL HIGH (ref 11.5–15.5)
WBC: 12.1 10*3/uL — ABNORMAL HIGH (ref 4.0–10.5)

## 2014-01-20 LAB — GLUCOSE, CAPILLARY
GLUCOSE-CAPILLARY: 214 mg/dL — AB (ref 70–99)
GLUCOSE-CAPILLARY: 239 mg/dL — AB (ref 70–99)
Glucose-Capillary: 168 mg/dL — ABNORMAL HIGH (ref 70–99)

## 2014-01-20 LAB — PROTIME-INR
INR: 1.48 (ref 0.00–1.49)
PROTHROMBIN TIME: 17.5 s — AB (ref 11.6–15.2)

## 2014-01-20 LAB — PROTHROMBIN GENE MUTATION

## 2014-01-20 MED ORDER — CEPHALEXIN 500 MG PO CAPS
500.0000 mg | ORAL_CAPSULE | Freq: Three times a day (TID) | ORAL | Status: DC
  Administered 2014-01-20 – 2014-01-21 (×3): 500 mg via ORAL
  Filled 2014-01-20 (×5): qty 1

## 2014-01-20 MED ORDER — WARFARIN SODIUM 7.5 MG PO TABS
7.5000 mg | ORAL_TABLET | Freq: Once | ORAL | Status: AC
  Administered 2014-01-20: 7.5 mg via ORAL
  Filled 2014-01-20: qty 1

## 2014-01-20 NOTE — Progress Notes (Signed)
[place] for heparin>>warfarin Indication: DVT  No Known Allergies Patient Measurements: Height: 5\' 6"  (167.6 cm) Weight: 158 lb 11.7 oz (72 kg) IBW/kg (Calculated) : 63.8 Heparin Dosing Weight: 71kg Vital Signs: Temp: 97.6 F (36.4 C) (02/26 0510) Temp src: Oral (02/26 0510) BP: 155/87 mmHg (02/26 0510) Pulse Rate: 86 (02/26 0510) Labs:  Recent Labs  01/18/14 0356 01/18/14 1140 01/19/14 0430 01/19/14 1816 01/20/14 0333  HGB 8.2*  --  7.8*  --  7.7*  HCT 25.6*  --  24.9*  --  24.5*  PLT 434*  --  412*  --  414*  LABPROT 20.7*  --  20.2*  --  17.5*  INR 1.84*  --  1.78*  --  1.48  HEPARINUNFRC  --  0.44  --  0.30 0.38  CREATININE 1.74*  --  1.57*  --  1.53*   Estimated Creatinine Clearance: 45.2 ml/min (by C-G formula based on Cr of 1.53).  Assessment: 62 YOM on heparin bridging to warfarin Patient s/p I&D 2/23, INR subtherapeutic and trending down.  Bleeding at I&D site resolved. Heparin level is therapeutic x2 levels. Hgb is trending down. Platelets remain stable.   Goal of Therapy:  INR goal 2-3 Heparin level 0.3-0.7 units/ml Monitor platelets by anticoagulation protocol: Yes   Plan:  Continue Heparin drip 1500 uts/hr  Daily HL Warfarin 7.5mg  po x1 tonight. Daily PT/INR  Sloan Leiter, PharmD, BCPS Clinical Pharmacist [phone] 01/20/2014 8:50 AM

## 2014-01-20 NOTE — Progress Notes (Signed)
Subjective: 3 Days Post-Op Procedure(s) (LRB): IRRIGATION AND DEBRIDEMENT  LEFT FOOT (Left) Patient reports pain as mild.  No n/v/f/c.  Objective: Vital signs in last 24 hours: Temp:  [97.2 F (36.2 C)-98 F (36.7 C)] 97.2 F (36.2 C) (02/26 1336) Pulse Rate:  [61-86] 67 (02/26 1336) Resp:  [18] 18 (02/26 1336) BP: (115-155)/(62-87) 137/62 mmHg (02/26 1336) SpO2:  [98 %-100 %] 100 % (02/26 1336) Weight:  [72 kg (158 lb 11.7 oz)] 72 kg (158 lb 11.7 oz) (02/26 0510)  Intake/Output from previous day: 02/25 0701 - 02/26 0700 In: 555 [P.O.:360; I.V.:195] Out: 875 [Urine:875] Intake/Output this shift:     Recent Labs  01/18/14 0356 01/19/14 0430 01/20/14 0333  HGB 8.2* 7.8* 7.7*    Recent Labs  01/19/14 0430 01/20/14 0333  WBC 14.0* 12.1*  RBC 3.24* 3.18*  HCT 24.9* 24.5*  PLT 412* 414*    Recent Labs  01/19/14 0430 01/20/14 0333  NA 138 139  K 4.7 5.0  CL 101 102  CO2 26 26  BUN 34* 36*  CREATININE 1.57* 1.53*  GLUCOSE 200* 196*  CALCIUM 8.4 8.6    Recent Labs  01/19/14 0430 01/20/14 0333  INR 1.78* 1.48   Cultures:  Staph aureus.  Sensitivities pending but wound cx from earlier this admission was sensitive staph.  PE:  dorsal and plantar wounds appear generally healthy.  No purulence.  No necrotic tissue.  Dressing / packing changed today.  Assessment/Plan: 3 Days Post-Op Procedure(s) (LRB): IRRIGATION AND DEBRIDEMENT  LEFT FOOT (Left)  Continue packing wounds open per orders.  Pt is ok for discharge at any time from ortho perspective.  Given extensive soft tissue involvement, he needs at least a two week course of abx post op.  Keflex seems appropriate given cx results.  He is wbat on the foot in a post op shoe.  He needs to f/u with me in the office in two weeks.  Ortho signing off.  Please call [phone] with any questions.  Wylene Simmer 01/20/2014, 3:35 PM

## 2014-01-20 NOTE — Progress Notes (Signed)
Medical Student Daily Progress Note  Subjective: No events overnight.   Objective: Vital signs in last 24 hours: Filed Vitals:   01/19/14 1600 01/19/14 1700 01/19/14 2213 01/20/14 0510  BP: 115/64 116/62 132/74 155/87  Pulse: 61  65 86  Temp: 97.8 F (36.6 C)  98 F (36.7 C) 97.6 F (36.4 C)  TempSrc: Oral  Oral Oral  Resp: '18  18 18  ' Height:      Weight:    72 kg (158 lb 11.7 oz)  SpO2: 98%  100% 100%   Weight change: -0.4 kg (-14.1 oz)  Intake/Output Summary (Last 24 hours) at 01/20/14 8563 Last data filed at 01/20/14 0700  Gross per 24 hour  Intake    315 ml  Output    875 ml  Net   -560 ml   Physical Exam: BP 155/87  Pulse 86  Temp(Src) 97.6 F (36.4 C) (Oral)  Resp 18  Ht '5\' 6"'  (1.676 m)  Wt 72 kg (158 lb 11.7 oz)  BMI 25.63 kg/m2  SpO2 100% General appearance: cooperative, no distress and appearing very sleepy with eyes closed, reports having only a few hours of sleep the night before Lungs: diminished lung sounds on right lower lobe Heart: regular rate and rhythm, S1, S2 normal, no murmur, click, rub or gallop Abdomen: soft, non-tender; bowel sounds normal; no masses,  no organomegaly Extremities: L foot: wrapped in extensive bandage, 2+ pitting edema bilaterally Pulses: 2+ and symmetric Lab Results: BMET    Component Value Date/Time   NA 139 01/20/2014 0333   K 5.0 01/20/2014 0333   CL 102 01/20/2014 0333   CO2 26 01/20/2014 0333   GLUCOSE 196* 01/20/2014 0333   BUN 36* 01/20/2014 0333   CREATININE 1.53* 01/20/2014 0333   CREATININE 1.14 01/05/2014 1616   CALCIUM 8.6 01/20/2014 0333   GFRNONAA 47* 01/20/2014 0333   GFRAA 55* 01/20/2014 0333    CBC    Component Value Date/Time   WBC 12.1* 01/20/2014 0333   RBC 3.18* 01/20/2014 0333   RBC 2.98* 01/12/2014 2308   HGB 7.7* 01/20/2014 0333   HCT 24.5* 01/20/2014 0333   PLT 414* 01/20/2014 0333   MCV 77.0* 01/20/2014 0333   MCH 24.2* 01/20/2014 0333   MCHC 31.4 01/20/2014 0333   RDW 19.4* 01/20/2014 0333   LYMPHSABS 1.5 01/05/2014 1616   MONOABS 1.5* 01/05/2014 1616   EOSABS 0.1 01/05/2014 1616   BASOSABS 0.0 01/05/2014 1616    Lab Results  Component Value Date   INR 1.48 01/20/2014   INR 1.78* 01/19/2014   INR 1.84* 01/18/2014   Heparin level: 0.38  Micro Results: Recent Results (from the past 240 hour(s))  ANAEROBIC CULTURE     Status: None   Collection Time    01/11/14  8:31 AM      Result Value Ref Range Status   Specimen Description WOUND FOOT LEFT   Final   Special Requests     Final   Value: LEFT PLANTAR ULCER PT ON ZOSYN AND ZYVOX AND AUGMENTIN   Gram Stain     Final   Value: NO WBC SEEN     NO SQUAMOUS EPITHELIAL CELLS SEEN     NO ORGANISMS SEEN     Performed at [facility]   Culture     Final   Value: NO ANAEROBES ISOLATED     Performed at [facility]   Report Status 01/16/2014 FINAL   Final  WOUND CULTURE  Status: None   Collection Time    01/11/14  8:31 AM      Result Value Ref Range Status   Specimen Description WOUND FOOT LEFT   Final   Special Requests LEFT PLANTAR ULCER PT ON ZYVOX,ZOSYN AND AUGMENTIN   Final   Gram Stain     Final   Value: NO WBC SEEN     NO SQUAMOUS EPITHELIAL CELLS SEEN     NO ORGANISMS SEEN     Performed at [facility]   Culture     Final   Value: FEW STAPHYLOCOCCUS AUREUS     Note: RIFAMPIN AND GENTAMICIN SHOULD NOT BE USED AS SINGLE DRUGS FOR TREATMENT OF STAPH INFECTIONS. This organism DOES NOT demonstrate inducible Clindamycin resistance in vitro.     Performed at [facility]   Report Status 01/14/2014 FINAL   Final   Organism ID, Bacteria STAPHYLOCOCCUS AUREUS   Final  CULTURE, ROUTINE-ABSCESS     Status: None   Collection Time    01/17/14  8:17 PM      Result Value Ref Range Status   Specimen Description ABSCESS FOOT LEFT   Final   Special Requests PATIENT ON FOLLOWING KEFZOL   Final   Gram Stain     Final   Value: ABUNDANT WBC PRESENT, PREDOMINANTLY PMN     NO SQUAMOUS EPITHELIAL CELLS  SEEN     NO ORGANISMS SEEN     Performed at [facility]   Culture     Final   Value: Culture reincubated for better growth     Performed at [facility]   Report Status PENDING   Incomplete  ANAEROBIC CULTURE     Status: None   Collection Time    01/17/14  8:17 PM      Result Value Ref Range Status   Specimen Description ABSCESS FOOT LEFT   Final   Special Requests PATIENT ON FOLLOWING KEFZOL   Final   Gram Stain     Final   Value: ABUNDANT WBC PRESENT, PREDOMINANTLY PMN     NO SQUAMOUS EPITHELIAL CELLS SEEN     NO ORGANISMS SEEN     Performed at [facility]   Culture     Final   Value: NO ANAEROBES ISOLATED; CULTURE IN PROGRESS FOR 5 DAYS     Performed at [facility]   Report Status PENDING   Incomplete   Studies/Results: No results found. Medications: I have reviewed the patient's current medications. Scheduled Meds: . carvedilol  12.5 mg Oral BID WC  . cephALEXin  500 mg Oral 3 times per day  . ferrous sulfate  325 mg Oral TID WC  . hydrALAZINE  50 mg Oral 3 times per day  . insulin aspart  0-9 Units Subcutaneous TID WC  . isosorbide mononitrate  30 mg Oral Daily  . pantoprazole  20 mg Oral QAC breakfast  . verapamil  120 mg Oral Daily  . warfarin  7.5 mg Oral ONCE-1800  . Warfarin - Pharmacist Dosing Inpatient   Does not apply q1800   Continuous Infusions: . heparin 1,500 Units/hr (01/20/14 0700)   PRN Meds:.HYDROcodone-acetaminophen, HYDROmorphone (DILAUDID) injection, morphine injection, ondansetron (ZOFRAN) IV, sodium chloride Assessment/Plan: Principal Problem:   Acute on chronic diastolic heart failure Active Problems:   Hypertension   Diabetes mellitus with renal manifestation   Hypoalbuminemia   Diabetic nephropathy with proteinuria   Cellulitis   Acute respiratory failure   New onset seizure  Acute renal failure   Acute acalculous cholecystitis   Elevated alkaline phosphatase level   Microcytic anemia   DVT of  popliteal vein   Possible GI bleeding   Nephrotic syndrome   Anasarca   LOS: 14 days   [name] is a 63yo man with h/o T2DM, diabetic nephropathy, L foot ulcer, HTN, reviously treated extrapulmonary TB who has returned to IMTS from ICU with sepsis possibly due to acalculous cholecystitis. Most active issues include AKI, dCHF, L foot ulcer, active DVT, and possible GIB.   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 AKI most likely caused by intrarenal mechanisms as FEurea is 39% and FEna is 1.7% on admission. Diabetic nephropathy is the most likely cause of underlying kidney disease, and may have been exacerbated by dCHF. Multiple etiologies were ruled out such as HIV, HCV, and lupus. Patient had acute DVTs, but coagulation panel was negative for any causes. Renal vein thrombosis was suspected, but patient is already on anticoagulation, so no renal vessel dopplers were done. Nephrology defers renal biopsy at this time.  Plan: -Hold off ACEi until AKI resolves -Restart heparin and warfarin today (2/26) - prev followed up by nephrology during this hospitalization  -Patient will be going to SNF, pending placement and insurance status  # dCHF: Echo with EF of 55%, and grade 2 diastolic dysfunction on ECHO. He still have excess volume on board and he is symptomatic with dyspnea and requiring supplementary O2 therapy via [place]. Patient will likely need oxygen when returning home give how his oxygen saturation is in the low 80's with activity. CXR 2/26 shows slight increase of the R pleural effusion, which can contribute to SOB, but does not explain his need of continuous oxygen. Therefore thoracocentesis is not indicated for now. Plan  - off lasix now as recommended by renal, may consider restarting once Cr is back to baseline.  - Plan for home oxygen due to patient's need  - Verapamil 127m daily  - Coreg 12.5 mg bid mainly for BP control.  - hold off ACEI with AKI  - Will  aim for negative or even fluid balance. Still making urine but +3.4L up and auto-diuresing  # Diabetic foot ulcer: MRI of L foot did not show osteomyelitis. L foot received irrigation and debridement on 2/23, and a significant amount of puss was found in the entire L forefoot. Puss was drained and a drain was placed through the foot. Ortho states that amputation is likely needed in the future. Wound culture showed some Staph species and no anaerobes. ABI on left shows left pedal area with pressure >200, adequate for digital flow to support healing.  Plan:  -Cephlaxin should cover staph species in wound, and will consider a longer duration of antibiotics (change 2/26 to 3/1) given the severity of foot ulcer found during I&D.  -Wound packing to prevent abscess  -Anticoagulants restarted this am. If bleeding recurs will hold coumadin and heparin. We can cont with pressure dressing for now.  # Iron-deficient anemia vs anemia of chronic disease: Low normal hemoglobin. Today at 8.3. Hemoglobin of 8.2 in 2015, but 13.9 in 2010. Iron panel shows low iron stores (Iron 19, Ferritin 179), and inappropriately low retic index (0.2). FOBT is positive x2. No colonoscopy on file, but there was 3 negative FOBT in 10/2012. GI recommends air- barium enema and an upper GI series for peptic ulcers as outpatient as he is no a candidate for scoping due  to clinical status. Transfused with one 1 unit of PRBCs. AIHA not suspected at this time given normal bilirubin.  Plan  - postransfusion Hbg 7.9 on 2/22. Goal above 8 given his co-morbidities.  - Protonix 36m PO qD: upper GI bleed and RUQ pain after eating  - Ferrous sulfate 322mtablet TID - will be discharged on this medication and followed up as outpatient   # HTN: still somehow elevated today. Goal pressure for proteinuric CKD is <130/80.  Plan  - Continue with hydralazine to 5075mID.  - Verapamil 120m41m  - increased Carvedilol to 12.5mg 38m  - Isosorbide  dinitrate 30 mg daily   # Acute DVT: Patient was found to have bilateral acute DVT without PE. Therapeutic on heparin with UFH of 0.44 and goal of 0.3-0.7. Supra therapeutic on Warfarin with INR of 3.92. Anticoagulation team following. If patient undergoes surgery, anticoagulation team will be notified to manage anticoagulation. He require anticoagulants for three months for first time symptomatic acute DVT .  - Restart anticoagulation today, 2/26   # Septic shock likely due to acalculous cholecystitis: Acalculous cholecystitis was found on RUQ ultrasound with gallbladder wall thickening of 1.1cm with chronically elevated alk phos and GGT ( in 2013, in 2015) indicating liver as source of elevation in alk phos. AST and ALT are now normal. TBili and DBili remained normal. 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 CBC is still showing leukocytosis. Other causes for sepsis that are ruled out are systemic bacteremia (only 1 out of 2 cultures with coagulase negative staph) and osteomyelitis (MRI does not show osteomyelitis). Patient was previously evaluated by surgery team, who recommended a HIDA scan. This did not show any hepatobiliary abnormalities. Outpatient followup for cholecystectomy was recommended once patient is medically stable.   # T2DM: CBG  - Hold Lantus until patient is no longer NPO, consider starting Lantus 10 units with breakfast as glucose during the day is elevated, but fasting glucose is around 120.  - SSI   # Seizure: resolved issue. Most likely seizures were induced by acute decompensation prior to his ICU course. EEG showed medication effect. Off keppra now.   # Hypoalbuminemia:  Patient has albumin of 1.7 with protein gap of 5.3 from total protein. Patient has a baseline hypoalbuminemia. However, UPEP showed elevated kappa and lambda free light chains, but the kappa/lambda ratio is normal (4). SPEP did not show M spike. We are less concerned for paraprotein phenomenon  even though patient has anemia, AKI, and elevated alkaline phosphate. Most likely cause is nephrotic syndrome. Patient does not appear to have liver disease given normal liver appearance on ultrasound and normal AST/ALT, and the acalculous cholecystitis can explain the elevated alk phos level.   Proph:  DVT: already on UFH  Mobility: PT and OT request rehab consult for patient. Patient requires walker    Dispo: patient does not have insurance and will be going to SNF. Social worker is working to help patient find SNF and insurance.   This is a [employer] information officer.  The care of the patient was discussed with Dr. KazibAlice Riegerthe assessment and plan formulated with their assistance.  Please see their attached note for official documentation of the daily encounter.  JiangKonrad Saha/2015, 9:03 AM   I have seen the patient and reviewed the daily progress note by Rui MElvin SoV and discussed the care of the patient with them. I discussed the care of the patient as documented above and I agree  with note.  Signed:  Jessee Avers, MD PGY-2 Internal Medicine Teaching Service Pager: [phone]

## 2014-01-21 ENCOUNTER — Inpatient Hospital Stay
Admission: RE | Admit: 2014-01-21 | Discharge: 2014-01-30 | Disposition: A | Discharge status: Other Acute Inpatient Hospital | Payer: PRIVATE HEALTH INSURANCE | Source: Ambulatory Visit | Attending: Internal Medicine | Admitting: Internal Medicine

## 2014-01-21 DIAGNOSIS — R609 Edema, unspecified: Principal | ICD-10-CM

## 2014-01-21 LAB — CULTURE, ROUTINE-ABSCESS

## 2014-01-21 LAB — BASIC METABOLIC PANEL
BUN: 36 mg/dL — ABNORMAL HIGH (ref 6–23)
CALCIUM: 8.5 mg/dL (ref 8.4–10.5)
CHLORIDE: 103 meq/L (ref 96–112)
CO2: 28 meq/L (ref 19–32)
Creatinine, Ser: 1.59 mg/dL — ABNORMAL HIGH (ref 0.50–1.35)
GFR calc Af Amer: 52 mL/min — ABNORMAL LOW (ref >90)
GFR calc non Af Amer: 45 mL/min — ABNORMAL LOW (ref >90)
Glucose, Bld: 163 mg/dL — ABNORMAL HIGH (ref 70–99)
Potassium: 5.1 mEq/L (ref 3.7–5.3)
SODIUM: 140 meq/L (ref 137–147)

## 2014-01-21 LAB — PROTIME-INR
INR: 1.7 — ABNORMAL HIGH (ref 0.00–1.49)
Prothrombin Time: 19.5 seconds — ABNORMAL HIGH (ref 11.6–15.2)

## 2014-01-21 LAB — CBC
HCT: 23.9 % — ABNORMAL LOW (ref 39.0–52.0)
Hemoglobin: 7.5 g/dL — ABNORMAL LOW (ref 13.0–17.0)
MCH: 24.6 pg — ABNORMAL LOW (ref 26.0–34.0)
MCHC: 31.4 g/dL (ref 30.0–36.0)
MCV: 78.4 fL (ref 78.0–100.0)
PLATELETS: 399 10*3/uL (ref 150–400)
RBC: 3.05 MIL/uL — AB (ref 4.22–5.81)
RDW: 19.7 % — ABNORMAL HIGH (ref 11.5–15.5)
WBC: 12.3 10*3/uL — AB (ref 4.0–10.5)

## 2014-01-21 LAB — GLUCOSE, CAPILLARY
GLUCOSE-CAPILLARY: 187 mg/dL — AB (ref 70–99)
Glucose-Capillary: 191 mg/dL — ABNORMAL HIGH (ref 70–99)
Glucose-Capillary: 217 mg/dL — ABNORMAL HIGH (ref 70–99)

## 2014-01-21 LAB — HEPARIN LEVEL (UNFRACTIONATED): HEPARIN UNFRACTIONATED: 0.44 [IU]/mL (ref 0.30–0.70)

## 2014-01-21 MED ORDER — FUROSEMIDE 40 MG PO TABS
40.0000 mg | ORAL_TABLET | ORAL | Status: DC
Start: 2014-01-21 — End: 2014-01-26

## 2014-01-21 MED ORDER — CEPHALEXIN 500 MG PO CAPS: 500.0000 mg | ORAL_CAPSULE | Freq: Three times a day (TID) | ORAL | Status: AC

## 2014-01-21 MED ORDER — WARFARIN SODIUM 7.5 MG PO TABS
7.5000 mg | ORAL_TABLET | Freq: Once | ORAL | Status: AC
  Administered 2014-01-21: 7.5 mg via ORAL
  Filled 2014-01-21: qty 1

## 2014-01-21 MED ORDER — WARFARIN SODIUM 7.5 MG PO TABS: 7.5000 mg | ORAL_TABLET | ORAL | Status: DC

## 2014-01-21 MED ORDER — FUROSEMIDE 40 MG PO TABS
40.0000 mg | ORAL_TABLET | ORAL | Status: DC
  Filled 2014-01-21: qty 1

## 2014-01-21 NOTE — Progress Notes (Addendum)
Physical Therapy Treatment Patient Details Name: Joshua [name] MRN: [id number] DOB: 01-15-1951 Today's Date: 01/21/2014 Time: [id number] PT Time Calculation (min): 29 min  PT Assessment / Plan / Recommendation  History of Present Illness 63 y.o. male admitted to [facility] on 01/06/14 with DM2, previous treatment (abbreviated) for extrapulmonary TB admitted with worseing foot ulcer with increasing pain, chills.  He had a foot ulcer plantar surface first metatarsal head left foot.  His hospital course was complicated by bradycardia, hypotension, seizure in the ICU.  The pt was intubated 01/07/14-01/08/14.  He was found to have bil peroneal DVTs, bil pleural effusions on chest CT (neg for PE), abdominal US revealed acute acalculous cholecystitis, and MRI of the foot showed no osteo, diffuse cellulitis, myositis, small 1st MTP joint effusion that is likely aseptic and incidental, osteonecrosis of first MTP joint.  Ortho preformed a bedside aspiration of the wound on 01/11/14.  Ortho recommends WBAT in post-op shoe.  S/p I&D L foot 12/17/13.     PT Comments   *Pt ambulated with supplemental O2 today, SaO2 96% on 3L O2 [name] with walking. Overall endurance improving. He walked 220' with RW. **  Follow Up Recommendations  SNF     Does the patient have the potential to tolerate intense rehabilitation     Barriers to Discharge        Equipment Recommendations  Rolling walker with 5" wheels    Recommendations for Other Services    Frequency Min 3X/week   Progress towards PT Goals Progress towards PT goals: Progressing toward goals  Plan Current plan remains appropriate    Precautions / Restrictions Precautions Precautions: Fall;Other (comment) Precaution Comments: pt is very deconditioned and weak.  Desats with activity as of 01/18/14 Required Braces or Orthoses: Other Brace/Splint Other Brace/Splint: post op shoe, left foot Restrictions LLE Weight Bearing: Weight bearing as tolerated   Pertinent  Vitals/Pain *0/10 pain SaO2 99% at rest on 3L O2 [name], 96% on 3L O2 with walking HR 90 with walking, dyspnea 1-2/4 with walking**    Mobility  Transfers Overall transfer level: Needs assistance Equipment used: Rolling walker (2 wheeled) Sit to Stand: Min assist General transfer comment: Min assist to support trunk during transitions to stand due to posterior lean.  Reinforced safe hand placement. Donned right tennis shoe prior to getting up.   Ambulation/Gait Ambulation Distance (Feet): 220 Feet Assistive device: Rolling walker (2 wheeled) Gait Pattern/deviations: Step-to pattern;Trunk flexed;Decreased stride length Gait velocity: decreased Gait velocity interpretation: <1.8 ft/sec, indicative of risk for recurrent falls General Gait Details: Pt ambulated with supplemental O2 today and had less dyspnea. SaO2 96% on 3L O2 with walking. VCs to inhale thru nose, but pt wasn't able to do so. VCs for upright posture. Improved endurance.     Exercises General Exercises - Lower Extremity Ankle Circles/Pumps: AROM;Both;10 reps;Seated   PT Diagnosis:    PT Problem List:   PT Treatment Interventions:     PT Goals (current goals can now be found in the care plan section) Acute Rehab PT Goals Patient Stated Goal: eventually to go home and be independent again Time For Goal Achievement: 01/27/14 Potential to Achieve Goals: Good  Visit Information  Last PT Received On: 01/21/14 Assistance Needed: +1 History of Present Illness: 63 y.o. male admitted to [facility] on 01/06/14 with DM2, previous treatment (abbreviated) for extrapulmonary TB admitted with worseing foot ulcer with increasing pain, chills.  He had a foot ulcer plantar surface first metatarsal head left foot.  His hospital  course was complicated by bradycardia, hypotension, seizure in the ICU.  The pt was intubated 01/07/14-01/08/14.  He was found to have bil peroneal DVTs, bil pleural effusions on chest CT (neg for PE), abdominal US revealed  acute acalculous cholecystitis, and MRI of the foot showed no osteo, diffuse cellulitis, myositis, small 1st MTP joint effusion that is likely aseptic and incidental, osteonecrosis of first MTP joint.  Ortho preformed a bedside aspiration of the wound on 01/11/14.  Ortho recommends WBAT in post-op shoe.  S/p I&D L foot 12/17/13.      Subjective Data  Patient Stated Goal: eventually to go home and be independent again   Cognition  Cognition Arousal/Alertness: Awake/alert Behavior During Therapy: WFL for tasks assessed/performed Overall Cognitive Status: Within Functional Limits for tasks assessed    Balance  Balance Standing balance-Leahy Scale: Poor  End of Session PT - End of Session Equipment Utilized During Treatment: Gait belt Activity Tolerance: Patient tolerated treatment well Patient left: in chair;with call bell/phone within reach Nurse Communication: Mobility status   GP     Joshua [name] 01/21/2014, 10:53 AM [phone]

## 2014-01-21 NOTE — Progress Notes (Signed)
[place] for heparin>>warfarin Indication: DVT  No Known Allergies Patient Measurements: Height: 5\' 6"  (167.6 cm) Weight: 158 lb 15.2 oz (72.1 kg) IBW/kg (Calculated) : 63.8 Heparin Dosing Weight: 71kg  Vital Signs: Temp: 97.6 F (36.4 C) (02/27 0510) Temp src: Oral (02/27 0510) BP: 148/87 mmHg (02/27 0510) Pulse Rate: 90 (02/27 0510) Labs:  Recent Labs  01/19/14 0430 01/19/14 1816 01/20/14 0333 01/21/14 0336  HGB 7.8*  --  7.7* 7.5*  HCT 24.9*  --  24.5* 23.9*  PLT 412*  --  414* 399  LABPROT 20.2*  --  17.5* 19.5*  INR 1.78*  --  1.48 1.70*  HEPARINUNFRC  --  0.30 0.38 0.44  CREATININE 1.57*  --  1.53* 1.59*   Estimated Creatinine Clearance: 43.5 ml/min (by C-G formula based on Cr of 1.59).  Assessment: 62 YOM on heparin bridging to warfarin. Patient s/p I&D 2/23. Bleeding at I&D site is now resolved. Heparin level remains therapeutic this morning. INR increased to 1.7. Hgb and platelets stable, no other bleeding noted.  Goal of Therapy:  INR goal 2-3 Heparin level 0.3-0.7 units/ml Monitor platelets by anticoagulation protocol: Yes   Plan:  1. Continue Heparin drip at 1500 units/hr  2. Warfarin 7.5mg  po x1 tonight. 3. Daily heparin level, CBC and PT/INR 4. Follow for s/s bleeding  Jazmaine Fuelling D. Cheri Ayotte, PharmD, BCPS Clinical Pharmacist Pager: [phone] 01/21/2014 8:56 AM

## 2014-01-21 NOTE — Progress Notes (Signed)
Medical Student Daily Progress Note  Subjective: No events overnight. Complains of inability to burp. No SOB, no abdominal pain.   Objective: Vital signs in last 24 hours: Filed Vitals:   01/20/14 1336 01/20/14 2155 01/21/14 0400 01/21/14 0510  BP: 137/62 141/80  148/87  Pulse: 67 63  90  Temp: 97.2 F (36.2 C) 98.1 F (36.7 C)  97.6 F (36.4 C)  TempSrc: Oral Oral  Oral  Resp: '18 18  18  ' Height:      Weight:   72.1 kg (158 lb 15.2 oz)   SpO2: 100% 100%  98%   Weight change: 0.1 kg (3.5 oz)  Intake/Output Summary (Last 24 hours) at 01/21/14 0957 Last data filed at 01/21/14 0000  Gross per 24 hour  Intake    555 ml  Output      0 ml  Net    555 ml   Physical Exam: BP 148/87  Pulse 90  Temp(Src) 97.6 F (36.4 C) (Oral)  Resp 18  Ht '5\' 6"'  (1.676 m)  Wt 72.1 kg (158 lb 15.2 oz)  BMI 25.67 kg/m2  SpO2 98% General appearance: alert, cooperative, no distress and patient was sleeping prior to [place] entering the room around 10am Lungs: Clear lungs, but diminished lung sounds on RLL Heart: regular rate and rhythm, S1, S2 normal, no murmur, click, rub or gallop Abdomen: soft, non-tender; bowel sounds normal; no masses,  no organomegaly Extremities: 2+ edema bilaterally; L foot in bandage s/p I&D Lab Results: BMET    Component Value Date/Time   NA 140 01/21/2014 0336   K 5.1 01/21/2014 0336   CL 103 01/21/2014 0336   CO2 28 01/21/2014 0336   GLUCOSE 163* 01/21/2014 0336   BUN 36* 01/21/2014 0336   CREATININE 1.59* 01/21/2014 0336   CREATININE 1.14 01/05/2014 1616   CALCIUM 8.5 01/21/2014 0336   GFRNONAA 45* 01/21/2014 0336   GFRAA 52* 01/21/2014 0336    CBC    Component Value Date/Time   WBC 12.3* 01/21/2014 0336   RBC 3.05* 01/21/2014 0336   RBC 2.98* 01/12/2014 2308   HGB 7.5* 01/21/2014 0336   HCT 23.9* 01/21/2014 0336   PLT 399 01/21/2014 0336   MCV 78.4 01/21/2014 0336   MCH 24.6* 01/21/2014 0336   MCHC 31.4 01/21/2014 0336   RDW 19.7* 01/21/2014 0336   LYMPHSABS 1.5  01/05/2014 1616   MONOABS 1.5* 01/05/2014 1616   EOSABS 0.1 01/05/2014 1616   BASOSABS 0.0 01/05/2014 1616   Lab Results  Component Value Date   INR 1.70* 01/21/2014   INR 1.48 01/20/2014   INR 1.78* 01/19/2014     Micro Results: Recent Results (from the past 240 hour(s))  CULTURE, ROUTINE-ABSCESS     Status: None   Collection Time    01/17/14  8:17 PM      Result Value Ref Range Status   Specimen Description ABSCESS FOOT LEFT   Final   Special Requests PATIENT ON FOLLOWING KEFZOL   Final   Gram Stain     Final   Value: ABUNDANT WBC PRESENT, PREDOMINANTLY PMN     NO SQUAMOUS EPITHELIAL CELLS SEEN     NO ORGANISMS SEEN     Performed at [facility]   Culture     Final   Value: FEW STAPHYLOCOCCUS AUREUS     Note: RIFAMPIN AND GENTAMICIN SHOULD NOT BE USED AS SINGLE DRUGS FOR TREATMENT OF STAPH INFECTIONS. This organism DOES NOT demonstrate inducible Clindamycin resistance in vitro.  Performed at [facility]   Report Status 01/21/2014 FINAL   Final   Organism ID, Bacteria STAPHYLOCOCCUS AUREUS   Final  ANAEROBIC CULTURE     Status: None   Collection Time    01/17/14  8:17 PM      Result Value Ref Range Status   Specimen Description ABSCESS FOOT LEFT   Final   Special Requests PATIENT ON FOLLOWING KEFZOL   Final   Gram Stain     Final   Value: ABUNDANT WBC PRESENT, PREDOMINANTLY PMN     NO SQUAMOUS EPITHELIAL CELLS SEEN     NO ORGANISMS SEEN     Performed at [facility]   Culture     Final   Value: NO ANAEROBES ISOLATED; CULTURE IN PROGRESS FOR 5 DAYS     Performed at [facility]   Report Status PENDING   Incomplete   Studies/Results: Dg Chest 2 View  01/20/2014   CLINICAL DATA:  Followup right lung volume loss. Shortness of breath.  EXAM: CHEST  2 VIEW  COMPARISON:  01/08/2014 and 01/07/2014  FINDINGS: Interval removal of nasogastric tube, right subclavian central venous catheter and endotracheal tubes. Lungs are hypoinflated with  continued evidence of a small left pleural effusion likely with associated atelectasis in the left base. There is slightly better aeration in the left base compared to the previous exam. There is slight worsening of a small right pleural effusion likely with associated atelectasis. Stable cardiomegaly. Remainder the exam is unchanged.  IMPRESSION: Mild worsening of a small right pleural effusion likely with associated right basilar atelectasis. Persistent small left pleural effusion without significant change. Persistent opacification in the left base with slightly better aeration which may be due to atelectasis or infection.   Electronically Signed   By: Marin Olp M.D.   On: 01/20/2014 12:18   Medications: I have reviewed the patient's current medications. Scheduled Meds: . carvedilol  12.5 mg Oral BID WC  . cephALEXin  500 mg Oral 3 times per day  . ferrous sulfate  325 mg Oral TID WC  . hydrALAZINE  50 mg Oral 3 times per day  . insulin aspart  0-9 Units Subcutaneous TID WC  . isosorbide mononitrate  30 mg Oral Daily  . pantoprazole  20 mg Oral QAC breakfast  . verapamil  120 mg Oral Daily  . warfarin  7.5 mg Oral ONCE-1800  . Warfarin - Pharmacist Dosing Inpatient   Does not apply q1800   Continuous Infusions: . heparin 1,500 Units/hr (01/20/14 1757)   PRN Meds:.HYDROcodone-acetaminophen, HYDROmorphone (DILAUDID) injection, morphine injection, ondansetron (ZOFRAN) IV, sodium chloride Assessment/Plan: Principal Problem:   Acute on chronic diastolic heart failure Active Problems:   Hypertension   Diabetes mellitus with renal manifestation   Hypoalbuminemia   Diabetic nephropathy with proteinuria   Cellulitis   Acute respiratory failure   New onset seizure   Acute renal failure   Acute acalculous cholecystitis   Elevated alkaline phosphatase level   Microcytic anemia   DVT of popliteal vein   Possible GI bleeding   Nephrotic syndrome   Anasarca   LOS: 15 days   Joshua [name] is a 63yo man with h/o T2DM, diabetic nephropathy, L foot ulcer, HTN, reviously treated extrapulmonary TB who has returned to IMTS from ICU with sepsis possibly due to acalculous cholecystitis. Most active issues include AKI, dCHF, L foot ulcer, active DVT, and possible GIB.   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 AKI most likely caused by intrarenal mechanisms as FEurea is 39% and FEna is 1.7% on admission. Diabetic nephropathy is the most likely cause of underlying kidney disease, and may have been exacerbated by dCHF. Multiple etiologies were ruled out such as HIV, HCV, and lupus. Patient had acute DVTs, but coagulation panel was negative for any causes. Renal vein thrombosis was suspected, but patient is already on anticoagulation, [name] no renal vessel dopplers were done. Nephrology defers renal biopsy at this time.  Plan:  -Nephrology signed-off, f/u outpatient  -Hold off ACEi until AKI resolves  -On warfarin and heparin. Will be discharged on oral coumadin per pharmacy -Patient will be going to SNF today, FL2 forms have been signed.  # dCHF: Echo with EF of 55%, and grade 2 diastolic dysfunction on ECHO. He still have excess volume on board and he is symptomatic with dyspnea and requiring supplementary O2 therapy. Patient will likely need oxygen when returning home give how his oxygen saturation is in the low 80's with activity. CXR 2/26 shows growing R pleural effusion, which can contribute to SOB, but does not explain his need of continuous oxygen. Although Cr is not back to baseline, it has stabilized. Will try gentle diuresis to improve pleural effusion and oxygen saturation. Plan  - Furosemide 16m PO every other day to see if this will help with the pleural effusion and shortness of breath  - Plan for home oxygen due to patient's need  - Verapamil 1254mdaily  - Coreg 12.5 mg bid mainly for BP control.  - hold off ACEI with AKI  - Will aim for  negative or even fluid balance. Still making urine but +3.9L up with auto-diuresing   # Diabetic foot ulcer: MRI of L foot did not show osteomyelitis. L foot received irrigation and debridement on 2/23, and a significant amount of puss was found in the entire L forefoot. Puss was drained and a drain was placed through the foot. Ortho states that amputation is likely needed in the future. Wound culture showed some Staph species and no anaerobes. ABI on left shows left pedal area with pressure >200, adequate for digital flow to support healing.  Plan:  -Cephlaxin 50079mO q8H until 02/02/2014 - should cover OSSA species in wound, and aim for a total of 4+ week of antibiotics (started on 2/12) given the severity of foot ulcer found during I&D.  -Wound packing to prevent abscess  -Anticoagulants on board. If bleeding recurs will hold coumadin and heparin.   # Iron-deficient anemia vs anemia of chronic disease: Low normal hemoglobin. Today at 8.3. Hemoglobin of 8.2 in 2015, but 13.9 in 2010. Iron panel shows low iron stores (Iron 19, Ferritin 179), and inappropriately low retic index (0.2). FOBT is positive x2. No colonoscopy on file, but there was 3 negative FOBT in 10/2012. GI recommends air- barium enema and an upper GI series for peptic ulcers as outpatient as he is no a candidate for scoping due to clinical status. Transfused with one 1 unit of PRBCs. AIHA not suspected at this time given normal bilirubin.  Plan  - postransfusion Hbg 7.9 on 2/22. Goal above 8 given his co-morbidities.  - Protonix 40m55m qD: upper GI bleed and RUQ pain after eating  - Ferrous sulfate 325mg26mlet TID - will be discharged on this medication and followed up as outpatient   # HTN: still somehow elevated today. Goal pressure for proteinuric CKD is <130/80.  Plan  -  Continue with hydralazine to 10m TID.  - Verapamil 1258mqD  - increased Carvedilol to 12.42m77mID  - Isosorbide dinitrate 30 mg daily   # Acute DVT:  Patient was found to have bilateral acute DVT without PE. Therapeutic on heparin with UFH of 0.44 and goal of 0.3-0.7. Supra therapeutic on Warfarin with INR of 3.92. Anticoagulation team following. If patient undergoes surgery, anticoagulation team will be notified to manage anticoagulation. He require anticoagulants for three months for first time symptomatic acute DVT .  - Restart anticoagulation today, 2/26   # Septic shock likely due to acalculous cholecystitis: Acalculous cholecystitis was found on RUQ ultrasound with gallbladder wall thickening of 1.1cm with chronically elevated alk phos and GGT ( in 2013, in 2015) indicating liver as source of elevation in alk phos. AST and ALT are now normal. TBili and DBili remained normal. 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 CBC is still showing leukocytosis. Other causes for sepsis that are ruled out are systemic bacteremia (only 1 out of 2 cultures with coagulase negative staph) and osteomyelitis (MRI does not show osteomyelitis). Patient was previously evaluated by surgery team, who recommended a HIDA scan. This did not show any hepatobiliary abnormalities. Outpatient followup for cholecystectomy was recommended once patient is medically stable.  -Simethicone 9m61m QID PRN as needed for dyspepsia or gas.   # T2DM: CBG   - SSI   # Seizure: resolved issue. Most likely seizures were induced by acute decompensation prior to his ICU course. EEG showed medication effect. Off keppra now.   # Hypoalbuminemia:  Patient has albumin of 1.7 with protein gap of 5.3 from total protein. Patient has a baseline hypoalbuminemia. However, UPEP showed elevated kappa and lambda free light chains, but the kappa/lambda ratio is normal (4). SPEP did not show M spike. We are less concerned for paraprotein phenomenon even though patient has anemia, AKI, and elevated alkaline phosphate. Most likely cause is nephrotic syndrome. Patient does not appear to have liver  disease given normal liver appearance on ultrasound and normal AST/ALT, and the acalculous cholecystitis can explain the elevated alk phos level.   Proph:  DVT: already on UFH  Mobility: PT and OT request rehab consult for patient. Patient requires walker    Dispo: patient does not have insurance and will be going to SNF. Social worker is working to help patient find SNF and insurance.      This is a [employer] information officere.  The care of the patient was discussed with Dr. ComeLinus Salmons the assessment and plan formulated with their assistance.  Please see their attached note for official documentation of the daily encounter.  Joshua Saha7/2015, 9:57 AM  I have seen the patient and reviewed the daily progress note by Joshua Elvin SoIV and discussed the care of the patient with them.  I agree with the documentation above with the correction made.  Signed:  RichJessee [name] PGY-2 Internal Medicine Teaching Service Pager: [phone]

## 2014-01-22 LAB — ANAEROBIC CULTURE

## 2014-01-23 ENCOUNTER — Encounter: Payer: Self-pay | Admitting: Internal Medicine

## 2014-01-23 ENCOUNTER — Non-Acute Institutional Stay (SKILLED_NURSING_FACILITY): Payer: PRIVATE HEALTH INSURANCE | Admitting: Internal Medicine

## 2014-01-23 DIAGNOSIS — D509 Iron deficiency anemia, unspecified: Secondary | ICD-10-CM

## 2014-01-23 DIAGNOSIS — K922 Gastrointestinal hemorrhage, unspecified: Secondary | ICD-10-CM

## 2014-01-23 DIAGNOSIS — N181 Chronic kidney disease, stage 1: Secondary | ICD-10-CM

## 2014-01-23 DIAGNOSIS — K810 Acute cholecystitis: Secondary | ICD-10-CM

## 2014-01-23 DIAGNOSIS — I5033 Acute on chronic diastolic (congestive) heart failure: Secondary | ICD-10-CM

## 2014-01-23 DIAGNOSIS — I824Y9 Acute embolism and thrombosis of unspecified deep veins of unspecified proximal lower extremity: Secondary | ICD-10-CM

## 2014-01-23 DIAGNOSIS — L97509 Non-pressure chronic ulcer of other part of unspecified foot with unspecified severity: Secondary | ICD-10-CM

## 2014-01-23 DIAGNOSIS — L97529 Non-pressure chronic ulcer of other part of left foot with unspecified severity: Secondary | ICD-10-CM

## 2014-01-23 DIAGNOSIS — I82439 Acute embolism and thrombosis of unspecified popliteal vein: Secondary | ICD-10-CM

## 2014-01-23 DIAGNOSIS — I10 Essential (primary) hypertension: Secondary | ICD-10-CM

## 2014-01-23 DIAGNOSIS — E1129 Type 2 diabetes mellitus with other diabetic kidney complication: Secondary | ICD-10-CM

## 2014-01-23 NOTE — Progress Notes (Signed)
Patient ID: Joshua [name], male   DOB: Jul 03, 1951, 63 y.o.   MRN: [id number]   This is an acute visit.  Level of care skilled.  Facility [facility]  Chief complaint-acute visit status post hospitalization for left foot ulcer-DVT-acute on chronic diastolic CHF.  History of present illness  Patient is a pleasant 63 year old male with a history of diabetes type 2 hypertension as well as a left foot ulcer.  Apparently the ulcer was increasing in size and had increased pain with weightbearing he had chills but no fever.  Also was noted to have an elevated white blood cell count outpatient clinic of over 14 with an ESR of 96.  Patient was seen in outpatient clinic with a foot x-ray that revealed subcutaneous gas from the lesion and does he was recommended for hospital admission to rule out osteomyelitis.  Apparently his hospital course was complicated as well with other factors including diastolic CHF with shortness of breath as well as diagnosis of DVT bilaterally lower extremities he was treated initially with heparin this has been transitioned to Coumadin.  In regards to left foot ulcer with cellulitis it was irrigated and debrided MRI did not show osteomyelitis.  We'll culture showed some staph species and no anaerobes-ABI showed adequate flow for healing-he would need to be continued on Keflex for 2 additional weeks until approximately March 13 and orthopedics to follow.  Apparently also has shortness of breath even on oxygen-he did have a cardiac echo showed an ejection fraction of 28% grade 2 diastolic dysfunction.  Required oxygen-to-3 L visa nasal cannula.  Initially his Lasix was held because of declining renal function but has been restarted at 40 mg every other day.  He was evaluated by heart failure came with recommendation for heart failure clinic as outpatient if needed.  He is on verapamil as well as Coreg his ACE was held secondary to renal issues.  Patient also apparently  was found to be in septic shock secondary likely to acalculus cholecystitis-ultrasound showed gallbladder wall thickening with chronically elevated alkaline phosphatase and GGT indicating liver as the source.  Apparently AST and ALT normalize before discharge as well as bilirubin-surgery was consulted and concluded this was probably chronic cholecystitis-outpatient followup for cholecystectomy was recommended once patient is more medically stable  Patient also has a history of iron deficiency anemia with an old normal hemoglobin between 7 and 8-iron panel showed low iron stores at 19 with a ferritin of 179/low reticulocyte count 0.2-also was found to have fecal occult blood positive x2.  GI was consulted who recommended an air barium enema and an upper GI series as an outpatient.  He did require a transfusion of 2 units during his hospitalization and this was stable hemoglobin goal was greater than 7.0 discharge hemoglobin was 7.5.  He is on Protonix as well as iron.  Regards to his renal issues this improved when the aggressive diuresis was halted.  Ultrasound did not show an acute process did show medical renal disease no evidence of hydronephrosis.  Recommendation hold off the ACE until renal function improves he is on Lasix every other day at 40 mg.  .  Patient was also found to have an acute lower extremity DVT bilaterally without pulmonary embolism he was placed on heparin this has been transitioned to Coumadin INR was high in the hospital of 3.92.  Apparently patient also had a seizure in the hospital this was thought secondary because of his decompensation prior to his ICU course- EEG showed  medication affect he is off Keppra apparently he had been on tha patient also has a low albumin of 1.7 this was thought most likely secondary to nephrotic syndrome.  Currently patient appears to be comfortable he is not complaining of any shortness of breath cough or chest pain he is eating lunch  and appears to have quite a good appetite.  Previous medical history.  Acute on chronic diastolic heart failure.  Left foot ulcer-cellulitis.  Chronic kidney disease.  Diabetes type 2.  Hypertension.  Diabetic nephropathy with proteinuria.   6 seizure.  Acute acalculous cholecystitis.  Microcytic anemia.  DVT in popliteal vein.  Possible GI bleeding.  Nephrotic syndrome.  Anasarca.  [facility].  01/07/2014-CT of head-premature atrophy suspected chronic microvascular ischemic change without large vessel infarct no acute intracranial findings.  CT of the chest-moderate left larger than right bilateral pleural effusions with associated bibasilar atelectasis by-mild interstitial pulmonary edema suspect CHF.  Frothy secretions noted the trachea distal to the endotracheal tube within the right lower bronchus-endobronchial lesion cannot be clinically excluded.  Nonspecific mediastinal adenopathy possibly reactive related to chronic CHF  01/09/2014.  MRI of the foot-negative for abscess or osteomyelitis changes of diffuse cellulitis identified anemia and enhancement of the flexorhalluis previous muscle is likely compatible with myositis.  Appearance of the medial sesamoid bone of the first MTP joint most consistent with chronic osteonecrosis.  01/09/2014.  Pulmonary perfusion and ventilation study-no evidence of pulmonary embolism.  01/08/2014-abdominal ultrasound.  Findings worrisome for acute acalculous cholecystitis.  [facility]  01/14/2014.  Renal ultrasound-findings consistent with medical renal disease no evidence of hydronephrosis.  01/07/2014.  Apparently there were venous Doppler ultrasounds of both lower extremities that showed acute DVTs.  Medications.  Coreg 12.5 mg twice a day.  Keflex 500 mg every 8 hours until 02/04/2014  Lasix 40 mg every other day.  Ferrous sulfate 325 mg 3 times a day.  Hydralazine 50 mg every 8 hours.  NovoLog  sliding scale insulin.  Imdur 30 mg daily.  Protonix 20 mg daily.  Verapamil 180 mg daily.  Coumadin alternating 7.5 and 5 mg daily.  Family medical social history reviewed per discharge note on 01/21/2014.  Review of systems.  In general denies any fever or chills has no complaints today.  Skin does not complaining of any rash or itching.  Head ears eyes nose mouth and throat-does not complaining of any visual changes nasal discharge or hearing difficulties.  Respiratory significant history here but does not complaining of any increased shortness of breath  Or cough  he is on oxygen 3 L via nasal cannula.  Cardiac-history of diastolic CHF and recent DVTs but does not complaining of pain chest pain or discomfort currently  GI-does not complaining of any nausea vomiting diarrhea constipation or abdominal discomfort has a good appetite.  GU does not complaining of dysuria.  Muscle skeletal is not complaining of joint pain currently.  Neurologic no complaints of headache dizziness syncopal-type feelings apparently had a seizure in the hospital this was a one-time episode it appears.  Psych is not complaining of depression or anxiety.  Physical exam.  Temperature is 98.3 pulse 80 respirations 20 blood pressure 137/69.  In general this is a well-nourished elderly male in no distress sitting comfortably in his wheelchair.  His skin is warm and dry his left foot is covered with only the toes exposed-I do not note any open areas on visible.  Eyes pupils appear reactive to light sclera and conjunctiva are clear  visual acuity appears grossly intact.  Oropharynx is clear mucous membranes moist.  Chest is clear to auscultation with shallow air entry likely more on the right versus the left there is no labored breathing--he does not complaining of shortness of breath.  Heart is regular rate and rhythm without murmur gallop or peers he has one-2+ edema venous stasis changes  bilaterally.  Abdomen is obese soft nontender somewhat protuberant there are positive bowel sounds.  Muscle skeletal ambulates in a wheelchair upper extremities appear to appropriate strength and range of motion have some lower extremity weakness left foot is wrapped currently.  Is able to move all 4 extremities.  Neurologic appears grossly intact speech is clear no lateralizing findings.  Psych appears to be alert and oriented x3 pleasant and appropriate.  Labs.  2-t 27 2015.  WBC 12.3 hemoglobin 7.5 platelets 399.  Sodium 140 potassium 5.1 BUN 36 creatinine 1.59.  01/20/2014.  Sodium 139 potassium 5.0 BUN 36 creatinine 1.53.  AST-44-ALT 56-alk phosphatase 374-bilirubin 0.3.  Assessment and plan.  #1-OXWRUEA of diastolic CHF-he is on Lasix every other day-will need to have weights monitored daily notify provider gain greater than 3 pounds he does continue on Coreg as well as verapamil clinically this appears relatively stable but will have to be watched closely.--He is currently on oxygen 3 L.  CurrentlyACE held secondary to renal issues-update BMP has been ordered for tomorrow.  #2 acute on chronic kidney disease-again Lasix is now every other day Will have to monitor his metabolic panel closely date has been ordered.  #3 history of left foot ulcer he continues on Keflex until mid-March this will need followup by orthopedics gas also had an elevated white count that appears to be trending down update CBC has been ordered.  #4-acute lower extremity DVTs-he is on Coumadin INR is pending this appears to be clinically stable-INR yesterday was therapeutic at 2.12 updated INR has been ordered for tomorrow.  #5 history of septic shock likely due to cholecystitis-he is asymptomatic white count appears to be trending down consider outpatient followup for cholecystectomy once patient is medically a bit more stable.  #6-history of microcytic anemia -- he is on iron supplementation  apparently had some fecal occult positive blood-he has been started on protonix as well as iron a CBC has been ordered-will need GI consult as an outpatient--.  #7-hypoalbuminemia this was thought most likely nephrotic syndrome apparently no suspicions of liver disease secondary to ultrasound and normal AST ALT-thought the cholecystitis explain the elevated alkaline phosphatase level area  #8-diabetes type 2-continues on sliding scale CBG so far have been in the mid 100s continue to monitor  9-- hypertension-continue to monitor this he is on hydralazine 50 mg every 8 hours Coreg 12.5 mg twice a day and verapamil 180 mg a day-it appears the goal systolic is 5:40 blood pressure is somewhat down today from yesterday when it was in the 150s at this point we'll continue to monitor he appears to be nearing goal--although we don't have a whole lot of reading so far  [id number]-of note greater than 40 minutes spent assessing patient-reviewing his hospital records-and coordinating and formulating a plan of care for numerous diagnoses-of note greater than 50% of time spent coordinating plan of care

## 2014-01-24 ENCOUNTER — Non-Acute Institutional Stay (SKILLED_NURSING_FACILITY): Payer: PRIVATE HEALTH INSURANCE | Admitting: Internal Medicine

## 2014-01-24 DIAGNOSIS — K810 Acute cholecystitis: Secondary | ICD-10-CM

## 2014-01-24 DIAGNOSIS — I824Y9 Acute embolism and thrombosis of unspecified deep veins of unspecified proximal lower extremity: Secondary | ICD-10-CM

## 2014-01-24 DIAGNOSIS — I5033 Acute on chronic diastolic (congestive) heart failure: Secondary | ICD-10-CM

## 2014-01-24 DIAGNOSIS — L97529 Non-pressure chronic ulcer of other part of left foot with unspecified severity: Secondary | ICD-10-CM

## 2014-01-24 DIAGNOSIS — L97509 Non-pressure chronic ulcer of other part of unspecified foot with unspecified severity: Secondary | ICD-10-CM

## 2014-01-24 DIAGNOSIS — I82439 Acute embolism and thrombosis of unspecified popliteal vein: Secondary | ICD-10-CM

## 2014-01-24 LAB — GLUCOSE, CAPILLARY
Glucose-Capillary: 210 mg/dL — ABNORMAL HIGH (ref 70–99)
Glucose-Capillary: 260 mg/dL — ABNORMAL HIGH (ref 70–99)
Glucose-Capillary: 238 mg/dL — ABNORMAL HIGH (ref 70–99)
Glucose-Capillary: 300 mg/dL — ABNORMAL HIGH (ref 70–99)

## 2014-01-24 NOTE — Discharge Summary (Signed)
I agree with summary as outlined below 

## 2014-01-25 ENCOUNTER — Ambulatory Visit (HOSPITAL_COMMUNITY)
Admit: 2014-01-25 | Discharge: 2014-01-25 | Disposition: A | Discharge status: Home / Self Care | Payer: PRIVATE HEALTH INSURANCE | Attending: Internal Medicine | Admitting: Internal Medicine

## 2014-01-25 DIAGNOSIS — T148XXA Other injury of unspecified body region, initial encounter: Secondary | ICD-10-CM

## 2014-01-25 LAB — GLUCOSE, CAPILLARY
GLUCOSE-CAPILLARY: 242 mg/dL — AB (ref 70–99)
Glucose-Capillary: 126 mg/dL — ABNORMAL HIGH (ref 70–99)

## 2014-01-25 NOTE — Evaluation (Signed)
Physical Therapy Evaluation  Patient Details  Name: Joshua [name] MRN: [id number] Date of Birth: 01-30-1951  Today's Date: 01/25/2014 Time: [id number] PT Time Calculation (min): 75 min Charge:  Evaluation 1330-1400 Deb 1400-1440             Visit#: 1 of 12  Re-eval: 02/24/14   Authorization: generic    Authorization Visit#: 1 of 12   Past Medical History:  Past Medical History  Diagnosis Date  . 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 Hypertension 10/14/2012  . Type 2 diabetes mellitus   . Diabetic nephropathy with proteinuria   . Hypoproteinemia 10/15/2012   Past Surgical History:  Past Surgical History  Procedure Laterality Date  . Knee arthroscopy  2010    3 times, right knee  . I&d extremity Left 01/17/2014    Procedure: IRRIGATION AND DEBRIDEMENT  LEFT FOOT;  Surgeon: Wylene Simmer, MD;  Location: [facility];  Service: Orthopedics;  Laterality: Left;    Subjective Symptoms/Limitations Symptoms: Joshua [name] states that he has had an ulcer on his Lt foot for the past year but it has been progressing in nature for the past two months.  He went into the hospital on 01/06/2014 due to his ulcer to rule out osteomyolytis but ended up having CHF, respiratory failure requiring ventilation and a seizure.  He was transferred to ICU.  He had an I &D  in the hospital and was eventually discharged on 01/21/2014 to the [facility].  His wounds continue to not show any sign of improvement therefore he has been referred to physical therapy for wound care.    How long can you sit comfortably?: no problem How long can you stand comfortably?: Pt states  that he has been instructed not to stand on his leg.  He states that he has been given a post op shoe but does not have an off loading darco boot. How long can you walk comfortably?: see above. Pain Assessment Currently in Pain?: No/denies (pain with debridement 5/10) Pain Location: Foot Pain Orientation: Left Pain Type:  Chronic pain   Balance Screening  no falls  Prior Functioning   I lived alone  Wound 1:   Location: Plantar aspect of Lt foot Length: 5.0 cm Width: 2.0 cm Depth: 2.0 cm Granulation: 5% Slough: 95% Drainage (amount/description): marked slightly purulent Periwound: macerated around wound but overall LE is very dry and scaley Other:  Wound 2:   Location: dorsal aspect of Lt foot Length: 4.0 cm Width: 1.5 cm Depth: 3.0 cm Granulation: 5% Slough: 95% Drainage (amount/description): marked purulent Periwound: macerated directly around wound   Pt had vinegar bath to LE followed by lotion and application of vaseline prior to dressing change   Dressing Used:  Dressing: silver alginate; 4x4; kerlix and coban Sharps Used/Location: forceps,, scissors  Tissue Remove: dead skin, callous and slough Pulse-lavage with tip and splashgaurd:   PSI: 5  Saline: 1000 ml  Clinical  Impressions: Pt is a 63 year old referred to OP PT for wound care with impairments listed below.  Pt will benefit from skilled OP PT wound care to address current impairments listed below: Increased necrotic tissue Decreased Granulation Tissue Altered Sensation Venous Insufficiency Edema Decreased Mobility Decreased knowledge of wound care  PT. Plan: Selective sharps debridement, Dressing Change, Pulse-Lavage,    Frequency/Duration:  __3____x/week for ___4__weeks.  PT Prognosis Wound Therapy - Potential for Goals: Excellent Good Fair Poor  Goals: Wound Therapy Goals - Improve the function  of patient's integumentary system by progressing the wound(s) through the phases of wound healing by:  Decrease Necrotic Tissue to: 0  Decrease Necrotic Tissue - Progress: Goal set today  Increase Granulation Tissue to: 100  Increase Granulation Tissue - Progress: Goal set today  Decrease Length/Width/Depth by (cm): 2x1x2 cm Decrease Length/Width/Depth - Progress: Goal set today  Improve Drainage Characteristics:  scant Improve Drainage Characteristics - Progress: Goal set today  Patient/Family will be able to : verbalize the importance of skin care and edema control in wound therapy. Patient/Family Instruction Goal - Progress: Goal set today  Goals/treatment plan/discharge plan were made with and agreed upon by patient/family: Yes      Problem List Patient Active Problem List   Diagnosis Date Noted  . Nonhealing nonsurgical wound 01/25/2014  . Nephrotic syndrome 01/14/2014  . Anasarca 01/14/2014  . Possible GI bleeding 01/12/2014  . Acute acalculous cholecystitis 01/11/2014  . Elevated alkaline phosphatase level 01/11/2014  . Microcytic anemia 01/11/2014  . DVT of popliteal vein 01/11/2014  . Acute on chronic diastolic heart failure 123XX123  . Cellulitis 01/07/2014  . Acute respiratory failure 01/07/2014  . New onset seizure 01/07/2014  . Acute renal failure 01/07/2014  . Foot ulcer, left 01/06/2014  . Dyspnea 01/05/2014  . Chronic leg pain 01/05/2014  . Puncture wound of sole of left foot without complication [id number]  . Bilateral lower extremity edema 04/01/2013  . Laceration of toe 03/22/2013  . Diabetic neuropathy 03/22/2013  . Cardiovascular risk factor 03/19/2013  . Abnormality of gait 02/27/2013  . CKD, stage 1 02/05/2013  . 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 Diabetic proliferative retinopathy 12/22/2012  . Intragel vitreous hemorrhage 12/22/2012  . Financial difficulties 11/02/2012  . Diabetes mellitus with renal manifestation 10/15/2012  . Hypoalbuminemia 10/15/2012  . Diabetic nephropathy with proteinuria 10/15/2012  . High risk sexual behavior 10/14/2012  . Hypertension 10/14/2012  . Venous stasis dermatitis 10/07/2012  . Routine health maintenance 10/07/2012  . Pulmonary tuberculosis 10/07/2012       GP    Joshua [name],Joshua [name] 01/25/2014, 4:13 PM  Physician Documentation Your signature is required to indicate approval of the treatment plan as stated above.   Please sign and either send electronically or make a copy of this report for your files and return this physician signed original.   Please mark one 1.__approve of plan  2. ___approve of plan with the following conditions.   ______________________________                                                          _____________________ Physician Signature                                                                                                             Date

## 2014-01-25 NOTE — Progress Notes (Addendum)
Patient ID: Joshua [name], male   DOB: Mar 08, 1951, 63 y.o.   MRN: [id number]                 HISTORY & PHYSICAL  DATE:  01/24/2014  FACILITY: [facility]    LEVEL OF CARE:   SNF   CHIEF COMPLAINT:  Admission to SNF, post complicated stay at [facility], 01/06/2014 through 02/27//2015.    HISTORY OF PRESENT ILLNESS:  This is a 63 year-old man who initially presented with a foot ulcer.  This was a plantar wound in close proximity to the first metatarsophalangeal joint which had been present for about a year.  This had increased in size and increase in pain on weightbearing.  He was noted to have an ESR of 96, a white count of 14.1, and an x-ray revealing subcutaneous gas around the lesion.  He had an MRI of the foot that did not show evidence of osteomyelitis.  However, there was increased T2 signal above the intrinsic musculature of the foot.  These were felt to be changes of diffuse cellulitis.  There was a small first metatarsophalangeal joint effusion, felt to be aseptic and incidental.  He underwent an I+D  The patient went on to have a complex hospitalization with a multitude of additional medical issues, although I am not exactly sure of the sequence.  These include:   Diastolic heart failure.  His echocardiogram showed an EF of 55% with grade 2 diastolic dysfunction.  He still required oxygen at 2-3 L per minute.  He had a right pleural effusion, but thoracentesis was not done.  At discharge, his Lasix was restarted at 40 mg every other day.  He is additionally on verapamil 125 daily, Coreg 12.5 b.i.d.  ACE inhibitors were not indicated due to acute kidney injury.    Septic shock, felt to be secondary to acalculus cholecystitis.  The patient complained of right upper quadrant pain, especially after meals.  The acalculus cholecystitis was found on right upper quadrant ultrasound with gallbladder wall thickening and an elevated alk phos and GGT.  I think the patient was seen by  General Surgery, who believed that this was chronic cholecystitis.  A HIDA scan did not show any hepatobiliary abnormalities.  An outpatient cholecystectomy was recommended once the patient is more medically stable.    Acute lower extremity DVT.  The patient was found to have bilateral acute DVT without PE on CT scan of the chest.  He also had a VQ scan.  He was discharged with IV heparin and Coumadin.  However, patients cannot receive IV heparin in nursing homes.    Iron deficiency anemia.  His hemoglobin is low at 7 to date.  Iron panel shows iron at 19, ferritin at 179.  He is not retic'ing.   Fecal occult blood was found to be positive.  He has not had a colonoscopy.  GI recommended an air contrast barium enema and upper GI series as an outpatient.  He was transfused 2 U of packed cells with a discharge hemoglobin of 7.5.  On Protonix 20 mg and ferrous sulfate 325  t.i.d.    Acute-on-chronic kidney injury.  The patient was seen by Nephrology.  Hold back ACE and allow Lasix 40 mg every other day.    Profound hypoalbuminemia with an albumin of 1.7.  There was some concern about a paraproteinemia.  However, the patient was felt to have nephrotic syndrome.  The acalculous cholecystitis was felt to explain the  alk phos levels.   Seizure disorder surrounding acute medical conditions before he went to the ICU.    PAST MEDICAL HISTORY/PROBLEM LIST:  Acute-on-chronic diastolic heart failure.    Hypertension.    Diabetes mellitus with renal manifestations.    Hypoalbuminemia.    Diabetic nephropathy with proteinuria.    Cellulitis about an extensive left foot ulceration.    Acute  respiratory failure.    New onset seizure disorder.    Acute-on-chronic renal failure.    Acute acalculous cholecystitis.    Microcytic hypochromic anemia secondary to iron deficiency.    DVT of popliteal vein, on Coumadin.    Occult blood positive stool.    Nephrotic syndrome.    Anasarca.    CURRENT  MEDICATIONS:  Discharge medications include:      Carvedilol 12.5 b.i.d.    Keflex 500 mg q.8, presumably for the foot infection.    SOCIAL HISTORY: HOUSING:  The patient lives on his own in [place].   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  REVIEW OF SYSTEMS:   CHEST/RESPIRATORY:  The patient states his chest feels like a "vacuum", though I am not exactly certain what that means.   CARDIAC:   No clearly described exertional chest pain.   GI:  No nausea,  vomiting or abdominal pain.  No diarrhea.   GU:  No dysuria.    PHYSICAL EXAMINATION:   GENERAL APPEARANCE:  The patient looks chronically ill, but in no acute medical distress.   HEENT:   MOUTH/THROAT:   No oral lesions seen.   CHEST/RESPIRATORY:  Decreased air entry with dullness to percussion at both bases.  I suspect he still has bilateral pleural effusions.   CARDIOVASCULAR:  CARDIAC:   Heart sounds are distant.  There is no S3.  No murmurs are heard.  JVP is elevated at 45 degrees.   GASTROINTESTINAL:  ABDOMEN:   Distended.  There is edema in the abdominal wall.  I did not attempt to see if he has intra-abdominal fluid.   LIVER/SPLEEN/KIDNEYS:  No liver or spleen is palpable.   GENITOURINARY:  BLADDER:   No suprapubic or costovertebral angle tenderness.   CIRCULATION:   EDEMA/VARICOSITIES:  Extremities:  Edema well up his entire legs.   ARTERIAL:  Peripheral pulses are easily palpable.   SKIN:  INSPECTION:  The wound is just towards the midline of his first metatarsophalangeal.  I think this was opened surgically, although I do not see the surgical note at the moment.  This is a clean wound on both the dorsal and ventral areas of his foot.  The area on the bottom of his foot easily probes to bone.  There is no obvious infection here, suggesting things are improved.   NEUROLOGICAL:   When I came into the room, the patient was up ambulating.  We will need to limit this  if he has any chance of healing this wound.   PSYCHIATRIC:   MENTAL STATUS:   The patient is bright, alert, and conversational.     ASSESSMENT/PLAN:  A multitude of serious chronic medical illnesses:    Anasarca.   He has diastolic heart failure as well as an albumin of 1.7 and nephrotic syndrome.  He was restarted on his Lasix at 40 mg every other day just before he left the hospital.  We will need to monitor his legs.  I will send him to our CHF beds.  His lab work from this morning showed a BUN of 43 and a creatinine of 1.7.  His potassium is 5, total CO2 of 30.      DVT in the popliteal vein.  His INR is therapeutic.  I will continue him on the same dose of Coumadin.    Left foot ulcer with cellulitis.  This is status post debridement on 01/17/2014.  Wound cultures showed some Staph species, no anaerobes.  His ABIs were done which showed adequate flow for healing.  He is to continue on cephalexin for two weeks.  He  apparently has orthopaedic follow-up.  I will change the dressing to silver/collagen.    Diabetic nephropathy with proteinuria and chronic renal failure.    Acute acalculous cholecystitis.  He was felt to require an elective cholecystectomy at some point.  However, he is in no shape for surgery at the moment.    Microcytic anemia with a hemoglobin of 7.8.    His stools were occult blood positive.  At some point, this is going to need to be worked up.  Again, hopefully, he will remain stable and get a little stronger.    New onset seizure disorder.  This was felt to be at the time of an acute decompensation.  Therefore, he is not on any medications for this.    Diabetes.  He is on a NovoLog sliding scale.  Not on any long-extending insulin.    Hypertension.  We will monitor.    A multitude of serious medical issues for this man.  It is difficult to see him back on his feet anytime soon.  The infection in his foot appears to be stable.  I wonder about more aggressive offloading  for this.    CPT CODE: 37543

## 2014-01-26 ENCOUNTER — Non-Acute Institutional Stay (SKILLED_NURSING_FACILITY): Payer: Self-pay | Admitting: Internal Medicine

## 2014-01-26 ENCOUNTER — Ambulatory Visit (HOSPITAL_COMMUNITY): Payer: PRIVATE HEALTH INSURANCE | Attending: Internal Medicine

## 2014-01-26 DIAGNOSIS — I517 Cardiomegaly: Secondary | ICD-10-CM | POA: Insufficient documentation

## 2014-01-26 DIAGNOSIS — I82439 Acute embolism and thrombosis of unspecified popliteal vein: Secondary | ICD-10-CM

## 2014-01-26 DIAGNOSIS — J9819 Other pulmonary collapse: Secondary | ICD-10-CM | POA: Insufficient documentation

## 2014-01-26 DIAGNOSIS — I5033 Acute on chronic diastolic (congestive) heart failure: Secondary | ICD-10-CM

## 2014-01-26 DIAGNOSIS — R609 Edema, unspecified: Secondary | ICD-10-CM | POA: Insufficient documentation

## 2014-01-26 DIAGNOSIS — L97509 Non-pressure chronic ulcer of other part of unspecified foot with unspecified severity: Secondary | ICD-10-CM

## 2014-01-26 DIAGNOSIS — L97529 Non-pressure chronic ulcer of other part of left foot with unspecified severity: Secondary | ICD-10-CM

## 2014-01-26 DIAGNOSIS — K810 Acute cholecystitis: Secondary | ICD-10-CM

## 2014-01-26 DIAGNOSIS — I824Y9 Acute embolism and thrombosis of unspecified deep veins of unspecified proximal lower extremity: Secondary | ICD-10-CM

## 2014-01-26 LAB — GLUCOSE, CAPILLARY
Glucose-Capillary: 294 mg/dL — ABNORMAL HIGH (ref 70–99)
Glucose-Capillary: 269 mg/dL — ABNORMAL HIGH (ref 70–99)
Glucose-Capillary: 144 mg/dL — ABNORMAL HIGH (ref 70–99)
Glucose-Capillary: 252 mg/dL — ABNORMAL HIGH (ref 70–99)
Glucose-Capillary: 374 mg/dL — ABNORMAL HIGH (ref 70–99)
Glucose-Capillary: 165 mg/dL — ABNORMAL HIGH (ref 70–99)

## 2014-01-27 ENCOUNTER — Encounter: Payer: Self-pay | Admitting: Internal Medicine

## 2014-01-27 ENCOUNTER — Ambulatory Visit (HOSPITAL_COMMUNITY)
Admit: 2014-01-27 | Discharge: 2014-01-27 | Disposition: A | Discharge status: Home / Self Care | Payer: PRIVATE HEALTH INSURANCE | Attending: *Deleted | Admitting: *Deleted

## 2014-01-27 DIAGNOSIS — T148XXA Other injury of unspecified body region, initial encounter: Secondary | ICD-10-CM

## 2014-01-27 LAB — GLUCOSE, CAPILLARY
GLUCOSE-CAPILLARY: 227 mg/dL — AB (ref 70–99)
Glucose-Capillary: 206 mg/dL — ABNORMAL HIGH (ref 70–99)

## 2014-01-27 NOTE — Progress Notes (Signed)
Patient ID: Joshua [name], male   DOB: Apr 29, 1951, 63 y.o.   MRN: [id number]                   PROGRESS NOTE  DATE:  01/26/2014    FACILITY: [facility]    LEVEL OF CARE:   SNF   Acute Visit   HISTORY OF PRESENT ILLNESS:    This is a 63 year-old whom I admitted to the facility two days ago, initially presenting to hospital with a wound on his left foot with an MRI showing cellulitis and myositis but no osteomyelitis.  He underwent a surgical I&D.  Culture of the foot grew MSSA.  The patient was appropriately treated for this.  As mentioned, he had a surgical debridement.  He has come to [place] with surgical wounds on the bottom of his left foot and on the dorsal aspect,  as well.    Other medical issues include:     Diastolic heart failure with an echocardiogram showing grade 2 changes.  He also had pulmonary hypertension, mildly dilated right ventricle.  He was started back on Lasix at 40 mg every other day.  His weights in the facility have not varied much:  Yesterday at 168.4 pounds, on admission at 167 pounds.    Type 2 diabetes.  Just on sliding scale insulin.  CBGs yesterday were 126, 242, 294, 269.  His fasting blood sugar this morning was 144.    Acute-on-chronic renal failure.  His BUN on 01/24/2014 was 43, creatinine 1.7.    Anemia.  Hemoglobin at 7.8 on 01/24/2014, slightly reduced MCH.  However, his MCV is normal.  Platelet count and white count are normal.  He left the hospital with a hemoglobin of 7.5.    Septic shock during his hospitalization, felt to be secondary to acalculous cholecystitis.  A HIDA scan did not show any hepatobiliary abnormalities.  An outpatient cholecystectomy was recommended.    Acute lower extremity DVT.  The patient was found to have bilateral acute DVT with no PE on CT scan of the chest.  He is on Coumadin.  His INR on 01/24/2014 was 2.12.    Anemia, which was felt to be iron deficiency.  Fecal occult blood was found to be positive.   GI recommended outpatient studies.  He is on Protonix and ferrous sulfate.    Profound hypoalbuminemia with an albumin of 1.7.    REVIEW OF SYSTEMS:   CHEST/RESPIRATORY:  When I walked into the room, the patient had just walked to the bathroom with his oxygen off.  His O2 sat was 77%, with exertion and being off his oxygen for a short period of time.  Nevertheless, he does not complain of shortness of breath or cough.   CARDIAC:   No exertional chest pain.    GI:  He is not complaining of right upper quadrant abdominal pain, nausea or vomiting.    PHYSICAL EXAMINATION:   VITAL SIGNS:   O2 SATURATIONS:  After he had been back on his oxygen at 2 L, his O2 sats came up to about 90%.   PULSE:  77.   RESPIRATIONS:  18.   GENERAL APPEARANCE:  The patient looks chronically ill.  He is alert and conversational.   CHEST/RESPIRATORY:  Showing no air entry of the right lower lobe with dullness to percussion.  There is slight air entry of the left lower lobe with, again, dullness to percussion of the left lower lobe, as well.  CARDIOVASCULAR:  CARDIAC:   Heart sounds are distant.  JVP is not elevated.   GASTROINTESTINAL:  ABDOMEN:   Distended.  There is edema in his abdominal wall, but no clear shifting dullness.   LIVER/SPLEEN/KIDNEYS:  No liver, no spleen.  No tenderness is noted.   CIRCULATION:   EDEMA/VARICOSITIES:  Extremities:  He has edema in the right leg much more than the left, but no tenderness is noted.     Otherwise, the edema extends up into his groin area.   SKIN:  INSPECTION:  Wounds on his left foot:  In general, these are clean.  There is some surface slough.  We have asked for assistance of Therapy in regards to pulse lavage.  This needs mechanical debridement, as well.   I will change his dressing to Santyl.    ASSESSMENT/PLAN:  Diastolically-mediated heart failure.  I suspect he has bilateral pleural pleural effusion and I am going to send him to the hospital for x-rays.  He is  probably going to need an increase in his diuretics.    Acalculous cholecystitis.  I really see no evidence of this being active at the moment.    He had bilateral lower extremity DVT.  He is on Coumadin.  His level is therapeutic.  He still has a fair amount of edema in the right leg.  He is going to need some form of wrap here most likely.    Iron deficiency anemia.   I will recheck his hemoglobin.    Profound hypoalbuminemia with an albumin of 1.7.  He does not have nephrotic range of proteinuria, but he did have 1.6 g on a 24-hour urine.    Type 2 diabetes with nephropathy.  Currently only on sliding scale.  I will change him to routine NovoLog, probably 2 U for a.c. breakfast, lunch, and supper.    Cellulitis and myositis of the left foot.  Everything appears to be stable here.  He continues on Keflex.  I see no evidence of ongoing infection.    Appropriate orders are left for this medically complex man.    CPT CODE: [id number]

## 2014-01-27 NOTE — Progress Notes (Signed)
Physical Therapy Treatment Patient Details  Name: Joshua [name] MRN: [id number] Date of Birth: December 02, 1950  Today's Date: 01/27/2014 Time: 1355-1440 PT Time Calculation (min): 45 min Charges: Debridement [id number] - 1440   Visit#: 2 of 12  Re-eval: 02/24/14   Authorization: generic   Subjective: Symptoms/Limitations Symptoms: Patient states that he was informed by doctor to continue to be non weight bearing on L LE.  Pain Assessment Currently in Pain?: No/denies (During debridement patient shouted with pain, wound otherwise nonpainful)  Exercise/Treatments Wound 1:  Location: Plantar aspect of Lt foot  Length: 5.0 cm, taken 01/24/14 Width: 2.0 cm 01/24/14 Depth: 2.0 cm 01/24/14 Granulation: 70%  Slough: 30%  Drainage (amount/description):  Moderate sanguinous Periwound: clean and dry with overall LE very dry and scaley  Other:  Wound 2:  Location: dorsal aspect of Lt foot  Length: 4.0 cm 01/24/14 Width: 1.5 cm 01/24/14 Depth: 3.0 cm 01/24/14 Granulation: 90% Slough: 10%  Drainage (amount/description): moderate sanguinous  Periwound: clean and dry Pt had vinegar bath to LE followed by lotion Dressing Used:  Dressing: silver alginate; 4x4; kerlix and coban  Sharps Used/Location: forceps,, scissors  Tissue Remove: dead skin, callous and slough  Pulse-lavage with tip and splashgaurd:  PSI: 5  Saline: 1000 ml  Clinical Impressions: Pt is a 63 year old referred to OP PT for wound care with impairments listed below. Pt will benefit from skilled OP PT wound care to address current impairments listed below:  Increased necrotic tissue  Decreased Granulation Tissue  Altered Sensation  Venous Insufficiency  Edema  Decreased Mobility  Decreased knowledge of wound care  Physical Therapy Assessment and Plan PT Assessment and Plan Clinical Impression Statement: Wound appears to be healling well with increased granulation, decreased edema, minimal slough. wound edges appeared to have begun  rolling in indicating need for wound edge lifting to improve tissue's ability to aproximate. Patient given darko metatarsal off loading shoe assist patient with increasing weight bearing status so patient may return to weight bearing.  PT Plan: Continue with current wound care plan.     Goals - progressing    Problem List Patient Active Problem List   Diagnosis Date Noted  . Nonhealing nonsurgical wound 01/25/2014  . Nephrotic syndrome 01/14/2014  . Anasarca 01/14/2014  . Possible GI bleeding 01/12/2014  . Acute acalculous cholecystitis 01/11/2014  . Elevated alkaline phosphatase level 01/11/2014  . Microcytic anemia 01/11/2014  . DVT of popliteal vein 01/11/2014  . Acute on chronic diastolic heart failure [id number]  . Cellulitis 01/07/2014  . Acute respiratory failure 01/07/2014  . New onset seizure 01/07/2014  . Acute renal failure 01/07/2014  . Foot ulcer, left 01/06/2014  . Dyspnea 01/05/2014  . Chronic leg pain 01/05/2014  . Puncture wound of sole of left foot without complication [id number]  . Bilateral lower extremity edema 04/01/2013  . Laceration of toe 03/22/2013  . Diabetic neuropathy 03/22/2013  . Cardiovascular risk factor 03/19/2013  . Abnormality of gait 02/27/2013  . CKD, stage 1 02/05/2013  . 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 Diabetic proliferative retinopathy 12/22/2012  . Intragel vitreous hemorrhage 12/22/2012  . Financial difficulties 11/02/2012  . Diabetes mellitus with renal manifestation 10/15/2012  . Hypoalbuminemia 10/15/2012  . Diabetic nephropathy with proteinuria 10/15/2012  . High risk sexual behavior 10/14/2012  . Hypertension 10/14/2012  . Venous stasis dermatitis 10/07/2012  . Routine health maintenance 10/07/2012  . Pulmonary tuberculosis 10/07/2012    PT - End of Session Activity Tolerance: Patient tolerated treatment well  GP    Tangelia Sanson R DPT 01/27/2014, 3:08 PM

## 2014-01-27 NOTE — Progress Notes (Signed)
This encounter was created in error - please disregard.

## 2014-01-28 ENCOUNTER — Ambulatory Visit (HOSPITAL_COMMUNITY)
Admit: 2014-01-28 | Discharge: 2014-01-28 | Disposition: A | Discharge status: Home / Self Care | Payer: PRIVATE HEALTH INSURANCE | Attending: *Deleted | Admitting: *Deleted

## 2014-01-28 DIAGNOSIS — T148XXA Other injury of unspecified body region, initial encounter: Secondary | ICD-10-CM

## 2014-01-28 LAB — GLUCOSE, CAPILLARY
GLUCOSE-CAPILLARY: 272 mg/dL — AB (ref 70–99)
Glucose-Capillary: 257 mg/dL — ABNORMAL HIGH (ref 70–99)
Glucose-Capillary: 222 mg/dL — ABNORMAL HIGH (ref 70–99)
Glucose-Capillary: 262 mg/dL — ABNORMAL HIGH (ref 70–99)
Glucose-Capillary: 243 mg/dL — ABNORMAL HIGH (ref 70–99)

## 2014-01-28 NOTE — Progress Notes (Signed)
Physical Therapy Treatment Patient Details  Name: Joshua [name] MRN: [id number] Date of Birth: Dec 20, 1950  Today's Date: 01/28/2014 Time: 1015-1105 PT Time Calculation (min): 50 min Charges: Debridement [id number]  Visit#: 3 of 12  Re-eval: 02/24/14    Authorization: generic   Subjective: Symptoms/Limitations Symptoms: Patient states that he feels his foot is healling well as it is feeling better Pain Assessment Currently in Pain?: No/denies  Exercise/Treatments  Wound 1:  Location: Plantar aspect of Lt foot  Length: 5.0 cm, taken 01/24/14  Width: 2.0 cm 01/24/14  Depth: 2.0 cm 01/24/14  Granulation: 75%  Slough: 25%  Drainage (amount/description): Moderate sanguinous  Periwound: clean and dry with overall LE dry and scaley  Other:  Wound 2:  Location: dorsal aspect of Lt foot  Length: 4.0 cm 01/24/14  Width: 1.5 cm 01/24/14  Depth: 3.0 cm 01/24/14  Granulation: 90%  Slough: 10%  Drainage (amount/description): moderate sanguinous  Periwound: clean and dry  Pt had vinegar bath to LE followed by lotion  Dressing Used:  Dressing: silver alginate; 4x4; kerlix and coban  Sharps Used/Location: forceps,, scissors  Tissue Remove: dead skin, callous and slough  Pulse-lavage with tip and splashgaurd:  PSI: 5  Saline: 1000 ml       Physical Therapy Assessment and Plan PT Assessment and Plan Clinical Impression Statement: Paqtient wound appears to be healing well, patient' skin displayed decreased dryness. Patient's wound edges were no longer curling in endicating a good healing environment and continued approximation. In patient's plantar surface wound slough was noticed deep in the wound bed and was able to be minimally removed,  secondary to poor patient tolerance. Dorsal wound displays minmal slough with patient unable to  removal of 50% of remaining slough.  PT Plan: Continue with current wound care plan.     Goals    Problem List Patient Active Problem List   Diagnosis Date  Noted  . Nonhealing nonsurgical wound 01/25/2014  . Nephrotic syndrome 01/14/2014  . Anasarca 01/14/2014  . Possible GI bleeding 01/12/2014  . Acute acalculous cholecystitis 01/11/2014  . Elevated alkaline phosphatase level 01/11/2014  . Microcytic anemia 01/11/2014  . DVT of popliteal vein 01/11/2014  . Acute on chronic diastolic heart failure [id number]  . Cellulitis 01/07/2014  . Acute respiratory failure 01/07/2014  . New onset seizure 01/07/2014  . Acute renal failure 01/07/2014  . Foot ulcer, left 01/06/2014  . Dyspnea 01/05/2014  . Chronic leg pain 01/05/2014  . Puncture wound of sole of left foot without complication [id number]  . Bilateral lower extremity edema 04/01/2013  . Laceration of toe 03/22/2013  . Diabetic neuropathy 03/22/2013  . Cardiovascular risk factor 03/19/2013  . Abnormality of gait 02/27/2013  . CKD, stage 1 02/05/2013  . 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 Diabetic proliferative retinopathy 12/22/2012  . Intragel vitreous hemorrhage 12/22/2012  . Financial difficulties 11/02/2012  . Diabetes mellitus with renal manifestation 10/15/2012  . Hypoalbuminemia 10/15/2012  . Diabetic nephropathy with proteinuria 10/15/2012  . High risk sexual behavior 10/14/2012  . Hypertension 10/14/2012  . Venous stasis dermatitis 10/07/2012  . Routine health maintenance 10/07/2012  . 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

## 2014-01-29 LAB — GLUCOSE, CAPILLARY
GLUCOSE-CAPILLARY: 140 mg/dL — AB (ref 70–99)
Glucose-Capillary: 201 mg/dL — ABNORMAL HIGH (ref 70–99)
Glucose-Capillary: 244 mg/dL — ABNORMAL HIGH (ref 70–99)
Glucose-Capillary: 269 mg/dL — ABNORMAL HIGH (ref 70–99)

## 2014-01-30 ENCOUNTER — Emergency Department (HOSPITAL_COMMUNITY): Payer: Self-pay

## 2014-01-30 ENCOUNTER — Inpatient Hospital Stay (HOSPITAL_COMMUNITY): Payer: PRIVATE HEALTH INSURANCE | Attending: Internal Medicine

## 2014-01-30 ENCOUNTER — Inpatient Hospital Stay (HOSPITAL_COMMUNITY): Payer: PRIVATE HEALTH INSURANCE

## 2014-01-30 ENCOUNTER — Inpatient Hospital Stay (HOSPITAL_COMMUNITY): Payer: Self-pay

## 2014-01-30 ENCOUNTER — Emergency Department (HOSPITAL_COMMUNITY): Payer: PRIVATE HEALTH INSURANCE

## 2014-01-30 ENCOUNTER — Encounter (HOSPITAL_COMMUNITY): Payer: Self-pay | Admitting: Emergency Medicine

## 2014-01-30 ENCOUNTER — Inpatient Hospital Stay (HOSPITAL_COMMUNITY)
Admission: EM | Admit: 2014-01-30 | Discharge: 2014-02-14 | DRG: 208 | Disposition: A | Discharge status: Skilled Nursing Facility | Payer: PRIVATE HEALTH INSURANCE | Attending: Internal Medicine | Admitting: Internal Medicine

## 2014-01-30 DIAGNOSIS — A150 Tuberculosis of lung: Secondary | ICD-10-CM | POA: Diagnosis present

## 2014-01-30 DIAGNOSIS — R197 Diarrhea, unspecified: Secondary | ICD-10-CM | POA: Diagnosis present

## 2014-01-30 DIAGNOSIS — I82439 Acute embolism and thrombosis of unspecified popliteal vein: Secondary | ICD-10-CM

## 2014-01-30 DIAGNOSIS — I509 Heart failure, unspecified: Secondary | ICD-10-CM | POA: Diagnosis present

## 2014-01-30 DIAGNOSIS — I469 Cardiac arrest, cause unspecified: Secondary | ICD-10-CM

## 2014-01-30 DIAGNOSIS — E11359 Type 2 diabetes mellitus with proliferative diabetic retinopathy without macular edema: Secondary | ICD-10-CM | POA: Diagnosis present

## 2014-01-30 DIAGNOSIS — T45515A Adverse effect of anticoagulants, initial encounter: Secondary | ICD-10-CM | POA: Diagnosis present

## 2014-01-30 DIAGNOSIS — R809 Proteinuria, unspecified: Secondary | ICD-10-CM | POA: Diagnosis present

## 2014-01-30 DIAGNOSIS — I824Y9 Acute embolism and thrombosis of unspecified deep veins of unspecified proximal lower extremity: Secondary | ICD-10-CM | POA: Diagnosis present

## 2014-01-30 DIAGNOSIS — I129 Hypertensive chronic kidney disease with stage 1 through stage 4 chronic kidney disease, or unspecified chronic kidney disease: Secondary | ICD-10-CM | POA: Diagnosis present

## 2014-01-30 DIAGNOSIS — Z79899 Other long term (current) drug therapy: Secondary | ICD-10-CM

## 2014-01-30 DIAGNOSIS — E11319 Type 2 diabetes mellitus with unspecified diabetic retinopathy without macular edema: Secondary | ICD-10-CM | POA: Diagnosis present

## 2014-01-30 DIAGNOSIS — R141 Gas pain: Secondary | ICD-10-CM | POA: Diagnosis present

## 2014-01-30 DIAGNOSIS — N179 Acute kidney failure, unspecified: Secondary | ICD-10-CM | POA: Diagnosis present

## 2014-01-30 DIAGNOSIS — E1110 Type 2 diabetes mellitus with ketoacidosis without coma: Secondary | ICD-10-CM

## 2014-01-30 DIAGNOSIS — N183 Chronic kidney disease, stage 3 unspecified: Secondary | ICD-10-CM | POA: Diagnosis present

## 2014-01-30 DIAGNOSIS — E872 Acidosis, unspecified: Secondary | ICD-10-CM | POA: Diagnosis present

## 2014-01-30 DIAGNOSIS — E1129 Type 2 diabetes mellitus with other diabetic kidney complication: Secondary | ICD-10-CM | POA: Diagnosis present

## 2014-01-30 DIAGNOSIS — G40909 Epilepsy, unspecified, not intractable, without status epilepticus: Secondary | ICD-10-CM | POA: Diagnosis present

## 2014-01-30 DIAGNOSIS — Z888 Allergy status to other drugs, medicaments and biological substances status: Secondary | ICD-10-CM

## 2014-01-30 DIAGNOSIS — I5032 Chronic diastolic (congestive) heart failure: Secondary | ICD-10-CM | POA: Diagnosis present

## 2014-01-30 DIAGNOSIS — R0600 Dyspnea, unspecified: Secondary | ICD-10-CM

## 2014-01-30 DIAGNOSIS — I472 Ventricular tachycardia, unspecified: Secondary | ICD-10-CM | POA: Diagnosis not present

## 2014-01-30 DIAGNOSIS — A0472 Enterocolitis due to Clostridium difficile, not specified as recurrent: Secondary | ICD-10-CM | POA: Diagnosis not present

## 2014-01-30 DIAGNOSIS — M79606 Pain in leg, unspecified: Secondary | ICD-10-CM

## 2014-01-30 DIAGNOSIS — R142 Eructation: Secondary | ICD-10-CM | POA: Diagnosis present

## 2014-01-30 DIAGNOSIS — E785 Hyperlipidemia, unspecified: Secondary | ICD-10-CM | POA: Diagnosis present

## 2014-01-30 DIAGNOSIS — E8809 Other disorders of plasma-protein metabolism, not elsewhere classified: Secondary | ICD-10-CM

## 2014-01-30 DIAGNOSIS — E113599 Type 2 diabetes mellitus with proliferative diabetic retinopathy without macular edema, unspecified eye: Secondary | ICD-10-CM | POA: Diagnosis present

## 2014-01-30 DIAGNOSIS — I4729 Other ventricular tachycardia: Secondary | ICD-10-CM | POA: Diagnosis not present

## 2014-01-30 DIAGNOSIS — R748 Abnormal levels of other serum enzymes: Secondary | ICD-10-CM | POA: Diagnosis present

## 2014-01-30 DIAGNOSIS — Z794 Long term (current) use of insulin: Secondary | ICD-10-CM

## 2014-01-30 DIAGNOSIS — N181 Chronic kidney disease, stage 1: Secondary | ICD-10-CM

## 2014-01-30 DIAGNOSIS — I2789 Other specified pulmonary heart diseases: Secondary | ICD-10-CM | POA: Diagnosis present

## 2014-01-30 DIAGNOSIS — R143 Flatulence: Secondary | ICD-10-CM

## 2014-01-30 DIAGNOSIS — R601 Generalized edema: Secondary | ICD-10-CM

## 2014-01-30 DIAGNOSIS — R600 Localized edema: Secondary | ICD-10-CM

## 2014-01-30 DIAGNOSIS — Z86718 Personal history of other venous thrombosis and embolism: Secondary | ICD-10-CM

## 2014-01-30 DIAGNOSIS — N058 Unspecified nephritic syndrome with other morphologic changes: Secondary | ICD-10-CM | POA: Diagnosis present

## 2014-01-30 DIAGNOSIS — L97509 Non-pressure chronic ulcer of other part of unspecified foot with unspecified severity: Secondary | ICD-10-CM | POA: Diagnosis present

## 2014-01-30 DIAGNOSIS — F3289 Other specified depressive episodes: Secondary | ICD-10-CM | POA: Diagnosis present

## 2014-01-30 DIAGNOSIS — E1139 Type 2 diabetes mellitus with other diabetic ophthalmic complication: Secondary | ICD-10-CM | POA: Diagnosis present

## 2014-01-30 DIAGNOSIS — I10 Essential (primary) hypertension: Secondary | ICD-10-CM

## 2014-01-30 DIAGNOSIS — N390 Urinary tract infection, site not specified: Secondary | ICD-10-CM | POA: Diagnosis present

## 2014-01-30 DIAGNOSIS — J9600 Acute respiratory failure, unspecified whether with hypoxia or hypercapnia: Principal | ICD-10-CM | POA: Diagnosis present

## 2014-01-30 DIAGNOSIS — I2510 Atherosclerotic heart disease of native coronary artery without angina pectoris: Secondary | ICD-10-CM | POA: Diagnosis present

## 2014-01-30 DIAGNOSIS — I872 Venous insufficiency (chronic) (peripheral): Secondary | ICD-10-CM

## 2014-01-30 DIAGNOSIS — D631 Anemia in chronic kidney disease: Secondary | ICD-10-CM | POA: Diagnosis present

## 2014-01-30 DIAGNOSIS — E1142 Type 2 diabetes mellitus with diabetic polyneuropathy: Secondary | ICD-10-CM

## 2014-01-30 DIAGNOSIS — G8929 Other chronic pain: Secondary | ICD-10-CM

## 2014-01-30 DIAGNOSIS — E1169 Type 2 diabetes mellitus with other specified complication: Secondary | ICD-10-CM | POA: Diagnosis present

## 2014-01-30 DIAGNOSIS — R579 Shock, unspecified: Secondary | ICD-10-CM | POA: Diagnosis present

## 2014-01-30 DIAGNOSIS — D509 Iron deficiency anemia, unspecified: Secondary | ICD-10-CM | POA: Diagnosis present

## 2014-01-30 DIAGNOSIS — R569 Unspecified convulsions: Secondary | ICD-10-CM

## 2014-01-30 DIAGNOSIS — F329 Major depressive disorder, single episode, unspecified: Secondary | ICD-10-CM | POA: Diagnosis present

## 2014-01-30 DIAGNOSIS — N049 Nephrotic syndrome with unspecified morphologic changes: Secondary | ICD-10-CM | POA: Diagnosis present

## 2014-01-30 DIAGNOSIS — K810 Acute cholecystitis: Secondary | ICD-10-CM

## 2014-01-30 DIAGNOSIS — Z87891 Personal history of nicotine dependence: Secondary | ICD-10-CM

## 2014-01-30 DIAGNOSIS — D689 Coagulation defect, unspecified: Secondary | ICD-10-CM | POA: Diagnosis present

## 2014-01-30 DIAGNOSIS — N039 Chronic nephritic syndrome with unspecified morphologic changes: Secondary | ICD-10-CM

## 2014-01-30 DIAGNOSIS — I5033 Acute on chronic diastolic (congestive) heart failure: Secondary | ICD-10-CM | POA: Diagnosis present

## 2014-01-30 DIAGNOSIS — Z8611 Personal history of tuberculosis: Secondary | ICD-10-CM

## 2014-01-30 DIAGNOSIS — E1149 Type 2 diabetes mellitus with other diabetic neurological complication: Secondary | ICD-10-CM | POA: Diagnosis present

## 2014-01-30 DIAGNOSIS — R45851 Suicidal ideations: Secondary | ICD-10-CM

## 2014-01-30 DIAGNOSIS — Z7901 Long term (current) use of anticoagulants: Secondary | ICD-10-CM

## 2014-01-30 DIAGNOSIS — E1121 Type 2 diabetes mellitus with diabetic nephropathy: Secondary | ICD-10-CM

## 2014-01-30 DIAGNOSIS — I495 Sick sinus syndrome: Secondary | ICD-10-CM | POA: Diagnosis present

## 2014-01-30 DIAGNOSIS — E11311 Type 2 diabetes mellitus with unspecified diabetic retinopathy with macular edema: Secondary | ICD-10-CM

## 2014-01-30 HISTORY — DX: Abnormal levels of other serum enzymes: R74.8

## 2014-01-30 HISTORY — DX: Iron deficiency anemia, unspecified: D50.9

## 2014-01-30 HISTORY — DX: Generalized edema: R60.1

## 2014-01-30 HISTORY — DX: Unspecified convulsions: R56.9

## 2014-01-30 HISTORY — DX: Gastrointestinal hemorrhage, unspecified: K92.2

## 2014-01-30 HISTORY — DX: Cellulitis, unspecified: L03.90

## 2014-01-30 HISTORY — DX: Nephrotic syndrome with unspecified morphologic changes: N04.9

## 2014-01-30 HISTORY — DX: Other disorders of plasma-protein metabolism, not elsewhere classified: E88.09

## 2014-01-30 HISTORY — DX: Acute embolism and thrombosis of unspecified vein: I82.90

## 2014-01-30 HISTORY — DX: Unspecified diastolic (congestive) heart failure: I50.30

## 2014-01-30 LAB — POCT I-STAT 3, ART BLOOD GAS (G3+)
ACID-BASE EXCESS: 1 mmol/L (ref 0.0–2.0)
Acid-Base Excess: 3 mmol/L — ABNORMAL HIGH (ref 0.0–2.0)
BICARBONATE: 27.6 meq/L — AB (ref 20.0–24.0)
Bicarbonate: 28.8 mEq/L — ABNORMAL HIGH (ref 20.0–24.0)
O2 Saturation: 100 %
O2 Saturation: 100 %
PH ART: 7.39 (ref 7.350–7.450)
TCO2: 29 mmol/L (ref 0–100)
TCO2: 30 mmol/L (ref 0–100)
pCO2 arterial: 46.7 mmHg — ABNORMAL HIGH (ref 35.0–45.0)
pCO2 arterial: 47.9 mmHg — ABNORMAL HIGH (ref 35.0–45.0)
pH, Arterial: 7.358 (ref 7.350–7.450)
pO2, Arterial: 212 mmHg — ABNORMAL HIGH (ref 80.0–100.0)
pO2, Arterial: 221 mmHg — ABNORMAL HIGH (ref 80.0–100.0)

## 2014-01-30 LAB — HEPATIC FUNCTION PANEL
ALBUMIN: 1.7 g/dL — AB (ref 3.5–5.2)
ALT: 59 U/L — ABNORMAL HIGH (ref 0–53)
ALT: 59 U/L — AB (ref 0–53)
AST: 41 U/L — AB (ref 0–37)
AST: 42 U/L — ABNORMAL HIGH (ref 0–37)
Albumin: 1.9 g/dL — ABNORMAL LOW (ref 3.5–5.2)
Alkaline Phosphatase: 526 U/L — ABNORMAL HIGH (ref 39–117)
Alkaline Phosphatase: 449 U/L — ABNORMAL HIGH (ref 39–117)
BILIRUBIN DIRECT: 0.3 mg/dL (ref 0.0–0.3)
Bilirubin, Direct: <0.2 mg/dL (ref 0.0–0.3)
Indirect Bilirubin: 0 mg/dL — ABNORMAL LOW (ref 0.3–0.9)
TOTAL PROTEIN: 6.4 g/dL (ref 6.0–8.3)
Total Bilirubin: 0.3 mg/dL (ref 0.3–1.2)
Total Bilirubin: 0.3 mg/dL (ref 0.3–1.2)
Total Protein: 7 g/dL (ref 6.0–8.3)

## 2014-01-30 LAB — CBC WITH DIFFERENTIAL/PLATELET
BASOS PCT: 0 % (ref 0–1)
Basophils Absolute: 0 10*3/uL (ref 0.0–0.1)
Eosinophils Absolute: 0.1 10*3/uL (ref 0.0–0.7)
Eosinophils Relative: 2 % (ref 0–5)
HCT: 24 % — ABNORMAL LOW (ref 39.0–52.0)
Hemoglobin: 7.5 g/dL — ABNORMAL LOW (ref 13.0–17.0)
LYMPHS PCT: 15 % (ref 12–46)
Lymphs Abs: 1.1 10*3/uL (ref 0.7–4.0)
MCH: 24.9 pg — ABNORMAL LOW (ref 26.0–34.0)
MCHC: 31.3 g/dL (ref 30.0–36.0)
MCV: 79.7 fL (ref 78.0–100.0)
Monocytes Absolute: 0.6 10*3/uL (ref 0.1–1.0)
Monocytes Relative: 8 % (ref 3–12)
NEUTROS ABS: 5.4 10*3/uL (ref 1.7–7.7)
Neutrophils Relative %: 75 % (ref 43–77)
PLATELETS: 334 10*3/uL (ref 150–400)
RBC: 3.01 MIL/uL — ABNORMAL LOW (ref 4.22–5.81)
RDW: 21 % — ABNORMAL HIGH (ref 11.5–15.5)
WBC: 7.2 10*3/uL (ref 4.0–10.5)

## 2014-01-30 LAB — URINE MICROSCOPIC-ADD ON

## 2014-01-30 LAB — TROPONIN I: Troponin I: <0.3 ng/mL (ref <0.30)

## 2014-01-30 LAB — CARBOXYHEMOGLOBIN
Carboxyhemoglobin: 0.7 % (ref 0.5–1.5)
Methemoglobin: 1.1 % (ref 0.0–1.5)
O2 SAT: 78 %
Total hemoglobin: 7.1 g/dL — ABNORMAL LOW (ref 13.5–18.0)

## 2014-01-30 LAB — BASIC METABOLIC PANEL
BUN: 62 mg/dL — ABNORMAL HIGH (ref 6–23)
CO2: 26 meq/L (ref 19–32)
Calcium: 8.6 mg/dL (ref 8.4–10.5)
Chloride: 105 mEq/L (ref 96–112)
Creatinine, Ser: 1.99 mg/dL — ABNORMAL HIGH (ref 0.50–1.35)
GFR calc Af Amer: 40 mL/min — ABNORMAL LOW (ref >90)
GFR, EST NON AFRICAN AMERICAN: 34 mL/min — AB (ref >90)
Glucose, Bld: 220 mg/dL — ABNORMAL HIGH (ref 70–99)
POTASSIUM: 4.7 meq/L (ref 3.7–5.3)
SODIUM: 140 meq/L (ref 137–147)

## 2014-01-30 LAB — BLOOD GAS, ARTERIAL
Acid-base deficit: 0.5 mmol/L (ref 0.0–2.0)
BICARBONATE: 25.1 meq/L — AB (ref 20.0–24.0)
Drawn by: 23534
FIO2: 0.1 %
MECHVT: 550 mL
O2 Content: 100 L/min
O2 SAT: 99.7 %
PEEP: 5 cmH2O
PO2 ART: 437 mmHg — AB (ref 80.0–100.0)
Patient temperature: 37
RATE: 14 resp/min
TCO2: 24.7 mmol/L (ref 0–100)
pCO2 arterial: 53.3 mmHg — ABNORMAL HIGH (ref 35.0–45.0)
pH, Arterial: 7.295 — ABNORMAL LOW (ref 7.350–7.450)

## 2014-01-30 LAB — RENAL FUNCTION PANEL
Albumin: 1.7 g/dL — ABNORMAL LOW (ref 3.5–5.2)
BUN: 59 mg/dL — AB (ref 6–23)
CHLORIDE: 108 meq/L (ref 96–112)
CO2: 26 mEq/L (ref 19–32)
CREATININE: 1.78 mg/dL — AB (ref 0.50–1.35)
Calcium: 8.9 mg/dL (ref 8.4–10.5)
GFR calc Af Amer: 45 mL/min — ABNORMAL LOW (ref >90)
GFR, EST NON AFRICAN AMERICAN: 39 mL/min — AB (ref >90)
Glucose, Bld: 162 mg/dL — ABNORMAL HIGH (ref 70–99)
Phosphorus: 3.9 mg/dL (ref 2.3–4.6)
Potassium: 4.1 mEq/L (ref 3.7–5.3)
Sodium: 143 mEq/L (ref 137–147)

## 2014-01-30 LAB — URINALYSIS, ROUTINE W REFLEX MICROSCOPIC
Bilirubin Urine: NEGATIVE
Glucose, UA: 100 mg/dL — AB
KETONES UR: NEGATIVE mg/dL
LEUKOCYTES UA: NEGATIVE
Nitrite: NEGATIVE
PH: 5.5 (ref 5.0–8.0)
Protein, ur: 100 mg/dL — AB
SPECIFIC GRAVITY, URINE: 1.025 (ref 1.005–1.030)
UROBILINOGEN UA: 0.2 mg/dL (ref 0.0–1.0)

## 2014-01-30 LAB — LACTIC ACID, PLASMA
LACTIC ACID, VENOUS: 2.2 mmol/L (ref 0.5–2.2)
Lactic Acid, Venous: 0.7 mmol/L (ref 0.5–2.2)

## 2014-01-30 LAB — APTT: aPTT: 46 seconds — ABNORMAL HIGH (ref 24–37)

## 2014-01-30 LAB — GLUCOSE, CAPILLARY
GLUCOSE-CAPILLARY: 172 mg/dL — AB (ref 70–99)
Glucose-Capillary: 165 mg/dL — ABNORMAL HIGH (ref 70–99)

## 2014-01-30 LAB — PROTIME-INR
INR: 3.94 — AB (ref 0.00–1.49)
INR: 4.68 — ABNORMAL HIGH (ref 0.00–1.49)
Prothrombin Time: 37 seconds — ABNORMAL HIGH (ref 11.6–15.2)
Prothrombin Time: 42.2 seconds — ABNORMAL HIGH (ref 11.6–15.2)

## 2014-01-30 LAB — PRO B NATRIURETIC PEPTIDE: Pro B Natriuretic peptide (BNP): 9165 pg/mL — ABNORMAL HIGH (ref 0–125)

## 2014-01-30 LAB — MAGNESIUM: Magnesium: 2.1 mg/dL (ref 1.5–2.5)

## 2014-01-30 LAB — PROCALCITONIN: Procalcitonin: <0.1 ng/mL

## 2014-01-30 LAB — MRSA PCR SCREENING: MRSA by PCR: NEGATIVE

## 2014-01-30 MED ORDER — INSULIN ASPART 100 UNIT/ML ~~LOC~~ SOLN
2.0000 [IU] | SUBCUTANEOUS | Status: DC
  Administered 2014-01-30: 4 [IU] via SUBCUTANEOUS
  Administered 2014-01-31: 2 [IU] via SUBCUTANEOUS

## 2014-01-30 MED ORDER — MIDAZOLAM HCL 2 MG/2ML IJ SOLN
INTRAMUSCULAR | Status: AC
  Administered 2014-01-30: 2 mg via INTRAVENOUS
  Filled 2014-01-30: qty 2

## 2014-01-30 MED ORDER — MIDAZOLAM HCL 2 MG/2ML IJ SOLN
2.0000 mg | Freq: Once | INTRAMUSCULAR | Status: AC
  Administered 2014-01-30: 2 mg via INTRAVENOUS

## 2014-01-30 MED ORDER — LORAZEPAM 2 MG/ML IJ SOLN
1.0000 mg | Freq: Once | INTRAMUSCULAR | Status: AC
  Administered 2014-01-30: 1 mg via INTRAVENOUS

## 2014-01-30 MED ORDER — CHLORHEXIDINE GLUCONATE 0.12 % MT SOLN
15.0000 mL | Freq: Two times a day (BID) | OROMUCOSAL | Status: DC
  Administered 2014-01-30 – 2014-01-31 (×2): 15 mL via OROMUCOSAL
  Filled 2014-01-30 (×2): qty 15

## 2014-01-30 MED ORDER — SODIUM CHLORIDE 0.9 % IV SOLN
INTRAVENOUS | Status: DC
  Administered 2014-01-30: 20 mL/h via INTRAVENOUS

## 2014-01-30 MED ORDER — LORAZEPAM 2 MG/ML IJ SOLN
INTRAMUSCULAR | Status: AC
  Administered 2014-01-30: 1 mg via INTRAVENOUS
  Filled 2014-01-30: qty 1

## 2014-01-30 MED ORDER — DEXMEDETOMIDINE HCL IN NACL 200 MCG/50ML IV SOLN
0.4000 ug/kg/h | INTRAVENOUS | Status: DC
  Filled 2014-01-30: qty 50

## 2014-01-30 MED ORDER — LEVOFLOXACIN IN D5W 750 MG/150ML IV SOLN
750.0000 mg | INTRAVENOUS | Status: DC
  Administered 2014-01-30: 750 mg via INTRAVENOUS
  Filled 2014-01-30: qty 150

## 2014-01-30 MED ORDER — PROPOFOL 10 MG/ML IV EMUL
5.0000 ug/kg/min | Freq: Once | INTRAVENOUS | Status: AC
  Administered 2014-01-30: 20 ug/kg/min via INTRAVENOUS

## 2014-01-30 MED ORDER — HYDRALAZINE HCL 20 MG/ML IJ SOLN
5.0000 mg | INTRAMUSCULAR | Status: DC | PRN
  Administered 2014-01-30 – 2014-01-31 (×2): 5 mg via INTRAVENOUS
  Filled 2014-01-30: qty 1

## 2014-01-30 MED ORDER — FENTANYL CITRATE 0.05 MG/ML IJ SOLN: 100.0000 ug | Freq: Once | INTRAMUSCULAR | Status: AC

## 2014-01-30 MED ORDER — BIOTENE DRY MOUTH MT LIQD
15.0000 mL | Freq: Four times a day (QID) | OROMUCOSAL | Status: DC
  Administered 2014-01-31 (×2): 15 mL via OROMUCOSAL

## 2014-01-30 MED ORDER — PROPOFOL 10 MG/ML IV EMUL
INTRAVENOUS | Status: AC
  Filled 2014-01-30: qty 100

## 2014-01-30 MED ORDER — PROPOFOL 10 MG/ML IV EMUL
5.0000 ug/kg/min | Freq: Once | INTRAVENOUS | Status: AC
  Administered 2014-01-30: 20 mg via INTRAVENOUS
  Administered 2014-01-30: 20 ug/kg/min via INTRAVENOUS

## 2014-01-30 MED ORDER — FENTANYL CITRATE 0.05 MG/ML IJ SOLN
INTRAMUSCULAR | Status: AC
  Administered 2014-01-30: 100 ug
  Filled 2014-01-30: qty 2

## 2014-01-30 MED ORDER — FAMOTIDINE IN NACL 20-0.9 MG/50ML-% IV SOLN
20.0000 mg | Freq: Two times a day (BID) | INTRAVENOUS | Status: DC
  Administered 2014-01-30 – 2014-01-31 (×2): 20 mg via INTRAVENOUS
  Filled 2014-01-30 (×3): qty 50

## 2014-01-30 MED ORDER — PROPOFOL 10 MG/ML IV EMUL
INTRAVENOUS | Status: AC
  Administered 2014-01-30: 20 mg via INTRAVENOUS
  Filled 2014-01-30: qty 100

## 2014-01-30 NOTE — ED Notes (Signed)
Pt's cousin and his spouse  here to see pt.   Dr. Dina Rich speaking with family.  [facility] given to family.

## 2014-01-30 NOTE — ED Notes (Signed)
Hypothermia protocol initiated. Ice packs applied to bilateral armpit and groin areas.

## 2014-01-30 NOTE — ED Notes (Signed)
Family at bedside. 

## 2014-01-30 NOTE — Procedures (Signed)
Arterial Catheter Insertion Procedure Note Joshua [name] [id number] Apr 03, 1951  Procedure: Insertion of Arterial Catheter  Indications: Blood pressure monitoring  Procedure Details Consent: Unable to obtain consent because of emergent medical necessity. Time Out: Verified patient identification, verified procedure, site/side was marked, verified correct patient position, special equipment/implants available, medications/allergies/relevent history reviewed, required imaging and test results available.  Performed  Maximum sterile technique was used including antiseptics, cap, gloves, gown, hand hygiene, mask and sheet. Skin prep: Chlorhexidine; local anesthetic administered 20 gauge catheter was inserted into left radial artery using the Seldinger technique.  Evaluation Blood flow good; BP tracing good. Complications: No apparent complications.   Joshua [name] 01/30/2014

## 2014-01-30 NOTE — Progress Notes (Signed)
No suction or tube adjusting due to procedure with rn and dr.

## 2014-01-30 NOTE — ED Notes (Signed)
Patient left ED at this time with [facility] staff.

## 2014-01-30 NOTE — Progress Notes (Signed)
PULMONARY  / CRITICAL CARE MEDICINE  Name: Joshua [name] MRN: [id number] DOB: 04-03-1951    ADMISSION DATE:  01/30/2014 CONSULTATION DATE:  3/8  REFERRING MD :  [facility] PRIMARY SERVICE: Pulm-CCM  CHIEF COMPLAINT:  Asystole/PEA arrest  BRIEF PATIENT DESCRIPTION: Patient undergoing outpatient kub due to diarrhea, abdominal distention (from rehab center) when he had asystole/PEA arrest with ROSC in 14 minutes.    SIGNIFICANT EVENTS / STUDIES:  CT head negative   LINES / TUBES: Right IJ 3/8 Left radial A line 3/8 ET tube 3/8  CULTURES: Blood and urine  ANTIBIOTICS: Levaquin 3/8  HISTORY OF PRESENT ILLNESS:   [name], [name], sent to outpatient xray due to abdominal distenion and diarrhea noted by nursing staff.  Patient became unresponsive and found to be in asystole, [name], 1 amp bicarb, 1 amp calcium chorloride, and 3 amps of epi.     Transferred to [facility] for possible TTM post cardiac arrest.  PMH  Recnt discharge 2/27 following left foot ulcer debridement (MRI at this time did not show osteomyelitis, debridement occurred on 2/23, wound culture with staph, placed on oral cephalexin with plans to discontinue 3/11), C. Diff infx, new DVT diagnosis and placed on coumadin (bilateral, dx on 2/13)  , AKI, and Acalculous cholecystitis.  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  Patient with history of malnutrition as well.  Since discharge has been on all his meds, no acute issues until today with distended abdomen, diarrhea, and pain.  Since arrival, patient with elevated blood pressure >200, GCS 10-T following commands, with 34 degrees  Celsius temp.  No arrhythmias, with EKG without evidence of st changes/cardiac ischemia.  Patient with mild reductions in LVEF, IVC not collapsible on exam, otherwise no focal wall motion abnormalities.  Trace pericardial effusion.  Currently making urine.  Intubated at OSH.  PAST MEDICAL HISTORY :  Past Medical History  Diagnosis Date  . 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 Hypertension 10/14/2012  . Type 2 diabetes mellitus   . Diabetic nephropathy with proteinuria   . 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 Diastolic heart failure   . Cellulitis   . Convulsions   . Iron deficiency anemia   . Venous thrombosis and embolism   . Anasarca   . Hypoalbuminemia   . Elevated alkaline phosphatase level   . Microcytic anemia   . GI bleed   . Nephrotic syndrome    Past Surgical History  Procedure Laterality Date  . Knee arthroscopy  2010    3 times, right knee  . I&d extremity Left 01/17/2014    Procedure: IRRIGATION AND DEBRIDEMENT  LEFT FOOT;  Surgeon: Wylene Simmer, MD;  Location: [place];  Service: Orthopedics;  Laterality: Left;   Prior to Admission medications   Medication Sig Start Date End Date Taking? Authorizing Provider  carvedilol (COREG) 12.5 MG tablet Take 1 tablet (12.5 mg total) by mouth 2 (two) times daily with a meal. 01/19/14  Yes Jessee Avers, MD  cephALEXin (KEFLEX) 500 MG capsule Take 1 capsule (500 mg total) by mouth every 8 (eight) hours. 01/21/14 02/04/14 Yes Jessee Avers, MD  ferrous sulfate 325 (65 FE) MG  tablet Take 1 tablet (325 mg total) by mouth 3 (three) times daily with meals. 01/19/14  Yes Jessee Avers, MD  hydrALAZINE (APRESOLINE) 50 MG tablet Take 1 tablet (50 mg total) by mouth every 8 (eight) hours. 01/19/14  Yes Jessee Avers, MD  ipratropium-albuterol (DUONEB) 0.5-2.5 (3) MG/3ML SOLN Take 3 mLs by nebulization every 6 (six) hours as needed.   Yes Historical Provider, MD  isosorbide mononitrate (IMDUR) 30 MG 24 hr tablet Take 1  tablet (30 mg total) by mouth daily. 01/19/14  Yes Jessee Avers, MD  omeprazole (PRILOSEC) 20 MG capsule Take 20 mg by mouth daily.   Yes Historical Provider, MD  verapamil (CALAN-SR) 120 MG CR tablet Take 1.5 tablets (180 mg total) by mouth daily. 01/19/14 01/19/15 Yes Jessee Avers, MD  warfarin (COUMADIN) 5 MG tablet Take 5-7.5 mg by mouth daily. Alternate 5mg  and 7.5mg  every other day   Yes Historical Provider, MD   Allergies  Allergen Reactions  . Aspirin Itching    FAMILY HISTORY:  No family history on file. SOCIAL HISTORY:  reports that he quit smoking about 21 years ago. His smoking use included Cigarettes. He smoked 0.00 packs per day for 20 years. He has never used smokeless tobacco. He reports that he does not drink alcohol or use illicit drugs.  REVIEW OF SYSTEMS:  Unable to give due to medical condition  SUBJECTIVE:   VITAL SIGNS: Temp:  [93.7 F (34.3 C)-95 F (35 C)] 93.7 F (34.3 C) (03/08 1900) Pulse Rate:  [54-63] 63 (03/08 1944) Resp:  [12-20] 14 (03/08 1944) BP: (110-196)/(56-96) 196/91 mmHg (03/08 1944) SpO2:  [100 %] 100 % (03/08 1944) FiO2 (%):  [50 %-100 %] 50 % (03/08 2000) Weight:  [158 lb 15.2 oz (72.1 kg)-167 lb 15.9 oz (76.2 kg)] 167 lb 15.9 oz (76.2 kg) (03/08 1824) HEMODYNAMICS:   VENTILATOR SETTINGS: Vent Mode:  [-] PRVC FiO2 (%):  [50 %-100 %] 50 % Set Rate:  [14 bmp-18 bmp] 18 bmp Vt Set:  [510 mL-550 mL] 510 mL PEEP:  [5 cmH20] 5 cmH20 Plateau Pressure:  [21 cmH20-42 cmH20] 31 cmH20 INTAKE / OUTPUT: Intake/Output     03/08 0701 - 03/09 0700   I.V. (mL/kg) 38.6 (0.5)   Total Intake(mL/kg) 38.6 (0.5)   Urine (mL/kg/hr) 275   Total Output 275   Net -236.4         PHYSICAL EXAMINATION: General:  Lethargic, hypertensive, acutely ill but non toxic. hypothermic Neuro:  Follows commands, pin point pupils but reactive, moves all extremties HEENT:  Frothy secretions, pink moist oral mucosa, an icteric, pupils reactive Cardiovascular:   RRR, sinus bradycardia at times, no st changes, normal S1S2, no MRG Lungs:  Patient with rales on exam, no wheezes or rhonchi, good air movement, no dyssyncrony Abdomen:  Anasarca, non tender, decreased bowel sounds, no ecchymosis Musculoskeletal:  Patient with diffuse edema, le wrapped in bandages secondary to surgical wounds, dry bandages without drainage Skin:  See above, warm dry without petechie or rashes  LABS:  Recent Labs Lab 01/30/14 1430 01/30/14 1500 01/30/14 1610 01/30/14 1946  HGB 7.5*  --   --   --   WBC 7.2  --   --   --   PLT 334  --   --   --   NA 140  --   --   --   K 4.7  --   --   --   CL 105  --   --   --  CO2 26  --   --   --   GLUCOSE 220*  --   --   --   BUN 62*  --   --   --   CREATININE 1.99*  --   --   --   CALCIUM 8.6  --   --   --   AST 41*  --   --   --   ALT 59*  --   --   --   ALKPHOS 526*  --   --   --   BILITOT 0.3  --   --   --   PROT 7.0  --   --   --   ALBUMIN 1.9*  --   --   --   INR  --  3.94*  --   --   LATICACIDVEN 2.2  --   --   --   PHART  --   --  7.295* 7.358  PCO2ART  --   --  53.3* 47.9*  PO2ART  --   --  437.0* 212.0*    Recent Labs Lab 01/29/14 0720 01/29/14 1148 01/29/14 1700 01/29/14 2033 01/30/14 0753  GLUCAP 140* 201* 244* 269* 172*    CXR: Perihilar congestion, no frank pulmonary edema, no pleural effusions  ASSESSMENT / PLAN:  PULMONARY A:  Respiratory Failure Post arrest, intubated for possible hypoxia induced PEA/asystole arrest Unable to protect airway P: <8 cc/kg IDW to avoid barotrauma Peak airway pressures <30 PRVC Avoid dyssynchrony Sedation:  Propofol (avoiding precedex due to bradycardia)   CARDIOVASCULAR A:  Asystole/PEA arrest Unknown cause, possible hypoxia, hypercarbia with acidosis, PE   P:  Discuss with Cardiology in the AM, troponin negative, pending ECHO, no EKG evidence of ischemia  CT of head prior to TTM negative TTM not indicated with patient GCS 10-T Glucose control  with goal 160-180; sub q insulin protocol in place Avoid hypo and hyperoxia; FiO2 to keep SpO2 >95 Follow electrolytes as per protocol Ionized Calcium correction Permissive hypertension, will treat if blood pressure >180 with hydralazine, avoiding ACEI secondary to AKI and bb due to bradycardia Patient with INR >4, no further anticoagulation needed, no evidence of significant V/Q mismatch.  RV is dilated however but no troponin elevation which if a hemodynamically significant PE was present should be significantly elevated  A:  DVT P: Pharmacy consulted for coumadin [facility] Currently supratheraputic  A:  HFpEF Diastolic HF P: Diruesis BB once HR improves Once creatinine stabilizes, will add ACEI Add home long acting nitrates once patient tolerating PO  RENAL A:  AKI Acute on chronic renal failure Multifactorial Shock  Plan: Allow autodiuresis, in the next 24-48 hours may add lasix gtt for more aggressive offloading of fluid as pressure tolerates MAP Goal >75-80 for adequate perfusion, however patient with MAP >100 currently Foley for strict I/O's Avoid nephrotoxic substances Renally dose medications  A:  Electrolytes P: Replaces as needed   GASTROINTESTINAL A:  Hx of GI bleed No melena on exam, stable H/H with elevated INR   P:   On stress ulcer prophylaxis  HEMATOLOGIC A:  Anemia P:  Stable, follow and transfuse if <7  INFECTIOUS A:  Possible UTI/Cholecystitis (vs stasis)/C. Diff P:   Levaquin Follow up serial labs, pending abdominal ultrasound Pending blood, urine cultures, as well as C. Diff due to abdominal distention and diarrhea   A:  Foot Ulcer  P:   Consult wound care for further management If appears infected again, may need amputation  ENDOCRINE A:  Glucose control DM II   P:   Insulin ICU protocol in place  NEUROLOGIC A:  Post Arrest P:   Monitor for seizures (hx of seizures, resolved previously)  I have personally obtained a  history, examined the patient, evaluated laboratory and imaging results, formulated the assessment and plan and placed orders. CRITICAL CARE: The patient is critically ill with multiple organ systems failure and requires high complexity decision making for assessment and support, frequent evaluation and titration of therapies, application of advanced monitoring technologies and extensive interpretation of multiple databases. Critical Care Time devoted to patient care services described in this note is 45 minutes.    Pulmonary and [place] Pager: [phone]  01/30/2014, 8:22 PM

## 2014-01-30 NOTE — Procedures (Signed)
Central Venous Catheter Insertion Procedure Note Joshua [name] [id number] 11-23-1951  Procedure: Insertion of Central Venous Catheter Indications: Assessment of intravascular volume, Drug and/or fluid administration and Frequent blood sampling  Procedure Details Consent: Risks of procedure as well as the alternatives and risks of each were explained to the (patient/caregiver).  Consent for procedure obtained. Time Out: Verified patient identification, verified procedure, site/side was marked, verified correct patient position, special equipment/implants available, medications/allergies/relevent history reviewed, required imaging and test results available.  Performed  Maximum sterile technique was used including antiseptics, cap, gloves, gown, hand hygiene, mask and sheet. Skin prep: Chlorhexidine; local anesthetic administered  Procedure summary:  Appropriate equipment was assembled.  The patient was identified as Software engineer and safety timeout was performed. The patient was placed in Trendelenburg position.  Sterile technique was used. The patient's right neck region was prepped using chlorhexidine / alcohol scrub and the field was draped in usual sterile fashion with full body drape. The right internal jugular vein and the right carotid artery were identified by ultrasound, the patency was evaluated and images were documented. After the adequate anesthesia was achieved, the vein was cannulated with the introducer needle under sonographic guidance without difficulty. A guide wire was advanced through the introducer needle, which was then withdrawn. A small skin incision was made at the point of wire entry, the dilator was inserted over the guide wire and appropriate dilation was obtained. The dilator was removed and 20 cm triple-lumen catheter was advanced over the guide wire, which was then removed.  All ports were aspirated and flushed with normal saline without difficulty. The catheter was  secured into place with sutures at 15 cm. Antibiotic patch was placed and sterile dressing was applied. Post-procedure chest x-ray was ordered.  Evaluation Blood flow good Complications: No apparent complications Patient did tolerate procedure well. Chest X-ray ordered to verify placement.  CXR: pending.  Joshua [name] 01/30/2014, 8:07 PM

## 2014-01-30 NOTE — ED Notes (Signed)
This RN responded to code blue and has remained with patient during code and ROSC period. Patient NSR on monitor with rate of 63. BP stable, oxygen 100% on 100%FiO2

## 2014-01-30 NOTE — ED Notes (Addendum)
Patient with noted swelling to groin area. Distended abdomen. Patient will buck vent, but does not follow commands or have purposeful movements. Noted decorticate posturing. MD aware.

## 2014-01-30 NOTE — ED Notes (Signed)
Patient accompanied to and from [place] with 2 RNs, RT and radiology tech. No complications during transport.

## 2014-01-30 NOTE — ED Notes (Signed)
Patient less agitated on propofol. Will occasionally cough/gag on vent, but no longer bucking the vent.

## 2014-01-30 NOTE — ED Notes (Signed)
Pt resident of [facility].  Sent to radiology for outpt x ray because of abd distention and diarrhea this morning.  Pt went into cardiac arrest during x ray.  COde started at 1411, compressions started.   No pulse, asystole on monitor at 1417, compressions continued.  At 1419, epi given IV, no pulse, compressions resumed.  At 1421 pt PEA on monitor, 1mg  epi administered.  At 1422 1 amp bicarb given.  CBG 201 at 1422.  1423 pt still PEA, intubated with size 8 ett and was secured at 29cm at lip.  Was called by radiologist to move ett back 5cm.  Pevony with respiratory repositioned tube and secured at 24cm at lip.    Bilateral breath sounds auscultated and positive color change on co2 detector.  1424 1 gram calcium chloride given.  At 1425 pt at palpable femoral pulse.  Orie Rout RN placed 68f OG tube, secured, and transported pt to CT.  Pt's HR initially 82 but slowed to 59-62.  Pt brought to ED.  Size 38f temp foley placed by Orie Rout RN.

## 2014-01-30 NOTE — ED Notes (Signed)
Patient mouth suctioned. Opens eyes during suctioning, still no purposeful movements.

## 2014-01-30 NOTE — ED Provider Notes (Signed)
CSN: [id number]     Arrival date & time 01/30/14  1438 History   First MD Initiated Contact with Patient 01/30/14 1440     Chief Complaint  Patient presents with  . Cardiac Arrest     (Consider location/radiation/quality/duration/timing/severity/associated sxs/prior Treatment) HPI  This is a 63 year old male with history of hypertension, diabetes, CHF, who presents status post arrest. Patient is a resident at the nursing center. He was brought to the radiology department for an outpatient x-ray. He was noted earlier today to have increasing abdominal distention and diarrhea. CODE BLUE was called. Per the radiology tech, patient was awake and talking. He took a gasping breath and was unresponsive. He was noted to have lost his pulse.    Upon my arrival, patient was being bagged and compression been started. Patient was placed on the monitor. Rhythm check showed asystole. Compressions were resumed. Patient was given epinephrine. After approximately 3 rounds of compressions, 2 mg of epinephrine, 1 amp of bicarbonate, and 1 g of calcium chloride patient had return of circulation. He was intubated during resuscitation efforts without drugs.  Quick noncontrasted chest and abdomen CTs were obtained  Level V caveat  Past Medical History  Diagnosis Date  . 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 Hypertension 10/14/2012  . Type 2 diabetes mellitus   . Diabetic nephropathy with proteinuria   . 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 Diastolic heart failure   . Cellulitis   . Convulsions   . Iron deficiency anemia   . Venous thrombosis and embolism   . Anasarca   . Hypoalbuminemia   . Elevated alkaline phosphatase level   . Microcytic anemia   . GI bleed   . Nephrotic syndrome    Past Surgical History  Procedure Laterality Date  . Knee arthroscopy  2010    3 times, right knee  . I&d extremity Left 01/17/2014    Procedure: IRRIGATION AND DEBRIDEMENT  LEFT FOOT;   Surgeon: Wylene Simmer, MD;  Location: [facility];  Service: Orthopedics;  Laterality: Left;   No family history on file. History  Substance Use Topics  . Smoking status: Former Smoker -- 20 years    Types: Cigarettes    Quit date: 11/25/1992  . Smokeless tobacco: Never Used  . Alcohol Use: No     Comment: Rarely.    Review of Systems  Unable to perform ROS: Acuity of condition      Allergies  Review of patient's allergies indicates no known allergies.  Home Medications   Current Outpatient Rx  Name  Route  Sig  Dispense  Refill  . carvedilol (COREG) 12.5 MG tablet   Oral   Take 1 tablet (12.5 mg total) by mouth 2 (two) times daily with a meal.   60 tablet   0   . cephALEXin (KEFLEX) 500 MG capsule   Oral   Take 1 capsule (500 mg total) by mouth every 8 (eight) hours.   42 capsule   0   . ferrous sulfate 325 (65 FE) MG tablet   Oral   Take 1 tablet (325 mg total) by mouth 3 (three) times daily with meals.   90 tablet   3   . furosemide (LASIX) 40 MG tablet   Oral   Take 40 mg by mouth daily.         . hydrALAZINE (APRESOLINE) 50 MG tablet   Oral   Take 1 tablet (50 mg total) by mouth every 8 (eight) hours.  90 tablet   0   . insulin aspart (NOVOLOG) 100 UNIT/ML injection   Subcutaneous   Inject 0-9 Units into the skin 3 (three) times daily with meals.   10 mL   11   . isosorbide mononitrate (IMDUR) 30 MG 24 hr tablet   Oral   Take 1 tablet (30 mg total) by mouth daily.   30 tablet   0   . pantoprazole (PROTONIX) 20 MG tablet   Oral   Take 1 tablet (20 mg total) by mouth daily before breakfast.   30 tablet   0   . verapamil (CALAN-SR) 120 MG CR tablet   Oral   Take 1.5 tablets (180 mg total) by mouth daily.   30 tablet   2   . warfarin (COUMADIN) 7.5 MG tablet   Oral   Take 1 tablet (7.5 mg total) by mouth as directed. Take 7.5 mg and 5 mg on alternating days. Take 7.5 mg today 01/21/2014 and take 5mg  tomorrow 01/22/2014.   30 tablet   0     BP 130/74  Pulse 59  Resp 14  SpO2 100% Physical Exam  Nursing note and vitals reviewed. Constitutional:  Unresponsive, no purposeful movement  HENT:  Head: Normocephalic and atraumatic.  Frothy secretions  Eyes: Pupils are equal, round, and reactive to light.  Pupils 3 mm and reactive bilaterally  Cardiovascular:  No murmur heard. asystole  Pulmonary/Chest:  Obtunded, no respiratory effort  Abdominal: Soft. He exhibits distension.  Musculoskeletal: He exhibits edema.  Bilateral lower extremities wrapped  Lymphadenopathy:    He has no cervical adenopathy.  Neurological:  GCS 3  Skin: Skin is warm.    ED Course  Procedures (including critical care time) Labs Review Labs Reviewed  CBC WITH DIFFERENTIAL - Abnormal; Notable for the following:    RBC 3.01 (*)    Hemoglobin 7.5 (*)    HCT 24.0 (*)    MCH 24.9 (*)    RDW 21.0 (*)    All other components within normal limits  HEPATIC FUNCTION PANEL - Abnormal; Notable for the following:    Albumin 1.9 (*)    AST 41 (*)    ALT 59 (*)    Alkaline Phosphatase 526 (*)    Indirect Bilirubin 0.0 (*)    All other components within normal limits  BASIC METABOLIC PANEL - Abnormal; Notable for the following:    Glucose, Bld 220 (*)    BUN 62 (*)    Creatinine, Ser 1.99 (*)    GFR calc non Af Amer 34 (*)    GFR calc Af Amer 40 (*)    All other components within normal limits  LACTIC ACID, PLASMA   Imaging Review Ct Abdomen Pelvis Wo Contrast  01/30/2014   CLINICAL DATA:  Cardiac arrest.  EXAM: CT CHEST, ABDOMEN AND PELVIS WITHOUT CONTRAST  TECHNIQUE: Multidetector CT imaging of the chest, abdomen and pelvis was performed following the standard protocol without IV contrast.  COMPARISON:  None.  CT CHEST W/O CM dated 01/07/2014; DG CHEST 1 VIEW dated 01/30/2014  FINDINGS: CT CHEST FINDINGS  The endotracheal tube extends into the right mainstem bronchus. This was immediately called to Dr. Dina Rich on 01/30/2014 at 3 p.m. Nasogastric  tube extends into the abdomen. There appears to be prominent mediastinal lymph nodes which are similar to the previous examination but poorly characterized due to the lack of contrast and motion. There are small bilateral pleural effusions, left side greater than right. No significant  pericardial fluid. There is diffuse subcutaneous edema. There is compressive atelectasis in lower lobes bilaterally. Patchy densities in left upper lung. Airspace densities within the right middle lobe. There are subtle densities in the right upper lung. No evidence for a large pneumothorax. No acute bone abnormality.  CT ABDOMEN AND PELVIS FINDINGS  There is no evidence for free air. Nasogastric tube extends into the distal stomach. There is diffuse subcutaneous edema. There is a small amount of abdominal ascites. Unenhanced CT was performed per clinician order. Lack of IV contrast limits sensitivity and specificity, especially for evaluation of abdominal/pelvic solid viscera. Gallbladder may be mildly distended but poorly characterized. No gross abnormality to the liver or spleen. Limited evaluation of the pancreas and adrenal glands. No gross abnormality to the kidneys. There are prominent small bowel loops in the left abdomen with mesenteric edema. Bowel wall thickening, particularly on sequence 2, image 86. Fluid in the urinary bladder. No acute bone abnormality.  IMPRESSION: The study is very limited due to the lack of contrast and motion.  Prominent small bowel loops in the left abdomen with mesenteric edema. There is concern for bowel wall thickening in this area. Based on the history of cardiac arrest, ischemic bowel is in the differential diagnosis. Findings could also be related to an infectious enteritis.  Bilateral small pleural effusions with patchy parenchymal lung densities. Findings could represent atelectasis and asymmetric edema. Infection cannot excluded, particularly in the right middle lobe.  Endotracheal tube in  the right mainstem bronchus as described.  Small amount of abdominal ascites and diffuse subcutaneous edema. Findings are concerning for anasarca.  Mild distention of the gallbladder.   Electronically Signed   By: Markus Daft M.D.   On: 01/30/2014 15:18   Dg Chest 1 View  01/30/2014   CLINICAL DATA:  Status post cardiac arrest ; assess endotracheal tube and nasogastric tube position  EXAM: CHEST - 1 VIEW  COMPARISON:  CT CHEST W/O CM dated 01/30/2014  FINDINGS: The trachea has undergone interval intubation. The tip of the ET tube lies in the proximal right mainstem bronchus. The lung volumes are reasonably well maintained. There are coarse interstitial markings bilaterally with near confluence at the right lung base.  The esophagogastric tube tip and proximal port project below the inferior margin of the film. The cardiac silhouette is mildly enlarged. The pulmonary vascularity is not engorged. External pacemaker pads are present.  IMPRESSION: 1. The endotracheal tube tip is at the margin of the right main stem bronchus and withdrawal by 5 cm is recommended. 2. The esophagogastric tube appears to be in appropriate position. 3. The pulmonary interstitial markings are mildly increased bilaterally especially on the right. Confluent density at the right lung base suggests atelectasis or pneumonia. 4. These results were called by telephone at the time of interpretation on 01/30/2014 at 2:55 PM to Doylene Canard, RN,, who verbally acknowledged these results.   Electronically Signed   By: David  Martinique   On: 01/30/2014 14:56   Dg Abd 1 View  01/30/2014   CLINICAL DATA:  Abdominal tenderness and loose stools.  EXAM: ABDOMEN - 1 VIEW  COMPARISON:  DG ABD PORTABLE 1V dated 01/07/2014  FINDINGS: The bowel gas pattern is normal. No obstruction or significant ileus is visualized. No abnormal calcifications or soft tissue abnormalities are identified. Mild degenerative changes are present of the lower lumbar spine.  IMPRESSION:  Unremarkable abdominal radiograph.   Electronically Signed   By: Aletta Edouard M.D.   On: 01/30/2014  14:24   Ct Chest Wo Contrast  01/30/2014   CLINICAL DATA:  Cardiac arrest.  EXAM: CT CHEST, ABDOMEN AND PELVIS WITHOUT CONTRAST  TECHNIQUE: Multidetector CT imaging of the chest, abdomen and pelvis was performed following the standard protocol without IV contrast.  COMPARISON:  None.  CT CHEST W/O CM dated 01/07/2014; DG CHEST 1 VIEW dated 01/30/2014  FINDINGS: CT CHEST FINDINGS  The endotracheal tube extends into the right mainstem bronchus. This was immediately called to Dr. Dina Rich on 01/30/2014 at 3 p.m. Nasogastric tube extends into the abdomen. There appears to be prominent mediastinal lymph nodes which are similar to the previous examination but poorly characterized due to the lack of contrast and motion. There are small bilateral pleural effusions, left side greater than right. No significant pericardial fluid. There is diffuse subcutaneous edema. There is compressive atelectasis in lower lobes bilaterally. Patchy densities in left upper lung. Airspace densities within the right middle lobe. There are subtle densities in the right upper lung. No evidence for a large pneumothorax. No acute bone abnormality.  CT ABDOMEN AND PELVIS FINDINGS  There is no evidence for free air. Nasogastric tube extends into the distal stomach. There is diffuse subcutaneous edema. There is a small amount of abdominal ascites. Unenhanced CT was performed per clinician order. Lack of IV contrast limits sensitivity and specificity, especially for evaluation of abdominal/pelvic solid viscera. Gallbladder may be mildly distended but poorly characterized. No gross abnormality to the liver or spleen. Limited evaluation of the pancreas and adrenal glands. No gross abnormality to the kidneys. There are prominent small bowel loops in the left abdomen with mesenteric edema. Bowel wall thickening, particularly on sequence 2, image 86. Fluid in  the urinary bladder. No acute bone abnormality.  IMPRESSION: The study is very limited due to the lack of contrast and motion.  Prominent small bowel loops in the left abdomen with mesenteric edema. There is concern for bowel wall thickening in this area. Based on the history of cardiac arrest, ischemic bowel is in the differential diagnosis. Findings could also be related to an infectious enteritis.  Bilateral small pleural effusions with patchy parenchymal lung densities. Findings could represent atelectasis and asymmetric edema. Infection cannot excluded, particularly in the right middle lobe.  Endotracheal tube in the right mainstem bronchus as described.  Small amount of abdominal ascites and diffuse subcutaneous edema. Findings are concerning for anasarca.  Mild distention of the gallbladder.   Electronically Signed   By: Markus Daft M.D.   On: 01/30/2014 15:18     EKG Interpretation   Date/Time:  Sunday January 30 2014 14:59:12 EDT Ventricular Rate:  60 PR Interval:  200 QRS Duration: 84 QT Interval:  426 QTC Calculation: 426 R Axis:   77 Text Interpretation:  Normal sinus rhythm Nonspecific T wave abnormality  Abnormal ECG When compared with ECG of 20-Jan-2014 00:45, No significant  change was found Confirmed by Dina Rich  MD, COURTNEY ([id number]) on 01/30/2014  3:09:54 PM     INTUBATION Performed by: Thayer Jew, F  Required items: required blood products, implants, devices, and special equipment available Patient identity confirmed: provided demographic data and hospital-assigned identification number Time out: Immediately prior to procedure a "time out" was called to verify the correct patient, procedure, equipment, support staff and site/side marked as required.  Indications: arrest Intubation method: Direct Laryngoscopy   Preoxygenation: BVM  Sedatives: none Paralytic: none Tube Size: 8-0 ETT  Post-procedure assessment: chest rise and ETCO2 monitor Breath sounds: equal and  absent  over the epigastrium Tube secured with: ETT holder Chest x-ray interpreted by radiologist and me.  Chest x-ray findings: ETT pulled back for right main stem  Patient tolerated the procedure well with no immediate complications.   Cardiopulmonary Resuscitation (CPR) Procedure Note Directed/Performed by: Thayer Jew, F I personally directed ancillary staff and/or performed CPR in an effort to regain return of spontaneous circulation and to maintain cardiac, neuro and systemic perfusion.   CRITICAL CARE Performed by: Thayer Jew, F   Total critical care time: 45 min  Critical care time was exclusive of separately billable procedures and treating other patients.  Critical care was necessary to treat or prevent imminent or life-threatening deterioration.  Critical care was time spent personally by me on the following activities: development of treatment plan with patient and/or surrogate as well as nursing, discussions with consultants, evaluation of patient's response to treatment, examination of patient, obtaining history from patient or surrogate, ordering and performing treatments and interventions, ordering and review of laboratory studies, ordering and review of radiographic studies, pulse oximetry and re-evaluation of patient's condition.  MDM   Final diagnoses:  Cardiac arrest    Patient presents as a cardiac arrest. Initially an asystolic arrest. Return if spontaneous circulation approximately 8 minutes into CPR.  Post-rest, patient is taking intermittent respirations. No purposeful movement. Not following commands or spontaneously opening his eyes. Basic labwork was obtained. Basic arrest EKG, without evidence of ischemia.  Discussed with critical care. Will call patient given witnessed arrest. CT head pending. Patient to be transferred to [facility].   Merryl Hacker, MD 01/30/14 [phone]

## 2014-01-30 NOTE — ED Notes (Signed)
[facility] at bedside 

## 2014-01-31 ENCOUNTER — Inpatient Hospital Stay (HOSPITAL_COMMUNITY): Admit: 2014-01-31 | Payer: PRIVATE HEALTH INSURANCE

## 2014-01-31 ENCOUNTER — Telehealth (HOSPITAL_COMMUNITY): Payer: Self-pay

## 2014-01-31 ENCOUNTER — Inpatient Hospital Stay (HOSPITAL_COMMUNITY): Payer: Self-pay

## 2014-01-31 DIAGNOSIS — E8809 Other disorders of plasma-protein metabolism, not elsewhere classified: Secondary | ICD-10-CM

## 2014-01-31 DIAGNOSIS — I369 Nonrheumatic tricuspid valve disorder, unspecified: Secondary | ICD-10-CM

## 2014-01-31 DIAGNOSIS — I469 Cardiac arrest, cause unspecified: Secondary | ICD-10-CM

## 2014-01-31 DIAGNOSIS — J9600 Acute respiratory failure, unspecified whether with hypoxia or hypercapnia: Principal | ICD-10-CM

## 2014-01-31 DIAGNOSIS — N179 Acute kidney failure, unspecified: Secondary | ICD-10-CM

## 2014-01-31 DIAGNOSIS — R609 Edema, unspecified: Secondary | ICD-10-CM

## 2014-01-31 DIAGNOSIS — I10 Essential (primary) hypertension: Secondary | ICD-10-CM

## 2014-01-31 DIAGNOSIS — I5033 Acute on chronic diastolic (congestive) heart failure: Secondary | ICD-10-CM

## 2014-01-31 DIAGNOSIS — M79609 Pain in unspecified limb: Secondary | ICD-10-CM

## 2014-01-31 LAB — COMPREHENSIVE METABOLIC PANEL
ALK PHOS: 438 U/L — AB (ref 39–117)
ALT: 54 U/L — AB (ref 0–53)
AST: 31 U/L (ref 0–37)
Albumin: 1.6 g/dL — ABNORMAL LOW (ref 3.5–5.2)
BUN: 53 mg/dL — ABNORMAL HIGH (ref 6–23)
CHLORIDE: 107 meq/L (ref 96–112)
CO2: 26 mEq/L (ref 19–32)
Calcium: 8.8 mg/dL (ref 8.4–10.5)
Creatinine, Ser: 1.57 mg/dL — ABNORMAL HIGH (ref 0.50–1.35)
GFR calc Af Amer: 53 mL/min — ABNORMAL LOW (ref >90)
GFR, EST NON AFRICAN AMERICAN: 46 mL/min — AB (ref >90)
GLUCOSE: 88 mg/dL (ref 70–99)
POTASSIUM: 3.9 meq/L (ref 3.7–5.3)
SODIUM: 144 meq/L (ref 137–147)
Total Bilirubin: 0.3 mg/dL (ref 0.3–1.2)
Total Protein: 6.4 g/dL (ref 6.0–8.3)

## 2014-01-31 LAB — RENAL FUNCTION PANEL
ALBUMIN: 1.7 g/dL — AB (ref 3.5–5.2)
BUN: 54 mg/dL — AB (ref 6–23)
CHLORIDE: 107 meq/L (ref 96–112)
CO2: 24 mEq/L (ref 19–32)
Calcium: 8.8 mg/dL (ref 8.4–10.5)
Creatinine, Ser: 1.61 mg/dL — ABNORMAL HIGH (ref 0.50–1.35)
GFR calc Af Amer: 51 mL/min — ABNORMAL LOW (ref >90)
GFR calc non Af Amer: 44 mL/min — ABNORMAL LOW (ref >90)
Glucose, Bld: 88 mg/dL (ref 70–99)
POTASSIUM: 3.9 meq/L (ref 3.7–5.3)
Phosphorus: 3.2 mg/dL (ref 2.3–4.6)
Sodium: 145 mEq/L (ref 137–147)

## 2014-01-31 LAB — CBC
HEMATOCRIT: 23.4 % — AB (ref 39.0–52.0)
HEMOGLOBIN: 7.4 g/dL — AB (ref 13.0–17.0)
MCH: 24.2 pg — ABNORMAL LOW (ref 26.0–34.0)
MCHC: 31.6 g/dL (ref 30.0–36.0)
MCV: 76.5 fL — ABNORMAL LOW (ref 78.0–100.0)
Platelets: 376 10*3/uL (ref 150–400)
RBC: 3.06 MIL/uL — AB (ref 4.22–5.81)
RDW: 20.9 % — ABNORMAL HIGH (ref 11.5–15.5)
WBC: 7.4 10*3/uL (ref 4.0–10.5)

## 2014-01-31 LAB — APTT: aPTT: 48 seconds — ABNORMAL HIGH (ref 24–37)

## 2014-01-31 LAB — GLUCOSE, CAPILLARY
GLUCOSE-CAPILLARY: 127 mg/dL — AB (ref 70–99)
GLUCOSE-CAPILLARY: 178 mg/dL — AB (ref 70–99)
GLUCOSE-CAPILLARY: 92 mg/dL (ref 70–99)
Glucose-Capillary: 86 mg/dL (ref 70–99)
Glucose-Capillary: 99 mg/dL (ref 70–99)
Glucose-Capillary: 122 mg/dL — ABNORMAL HIGH (ref 70–99)
Glucose-Capillary: 107 mg/dL — ABNORMAL HIGH (ref 70–99)

## 2014-01-31 LAB — CALCIUM, IONIZED: Calcium, Ion: 1.31 mmol/L — ABNORMAL HIGH (ref 1.13–1.30)

## 2014-01-31 LAB — PROTIME-INR
INR: 5 — ABNORMAL HIGH (ref 0.00–1.49)
PROTHROMBIN TIME: 44.4 s — AB (ref 11.6–15.2)

## 2014-01-31 LAB — PROCALCITONIN: PROCALCITONIN: 0.11 ng/mL

## 2014-01-31 LAB — LACTIC ACID, PLASMA
LACTIC ACID, VENOUS: 0.7 mmol/L (ref 0.5–2.2)
Lactic Acid, Venous: 0.8 mmol/L (ref 0.5–2.2)

## 2014-01-31 LAB — TROPONIN I
Troponin I: <0.3 ng/mL (ref <0.30)
Troponin I: <0.3 ng/mL (ref <0.30)

## 2014-01-31 MED ORDER — VANCOMYCIN HCL 10 G IV SOLR
1250.0000 mg | INTRAVENOUS | Status: DC
  Administered 2014-01-31: 1250 mg via INTRAVENOUS
  Filled 2014-01-31 (×2): qty 1250

## 2014-01-31 MED ORDER — ISOSORBIDE MONONITRATE ER 30 MG PO TB24
30.0000 mg | ORAL_TABLET | Freq: Every day | ORAL | Status: DC
  Administered 2014-01-31 – 2014-02-14 (×15): 30 mg via ORAL
  Filled 2014-01-31 (×16): qty 1

## 2014-01-31 MED ORDER — PIPERACILLIN-TAZOBACTAM 3.375 G IVPB
3.3750 g | Freq: Three times a day (TID) | INTRAVENOUS | Status: DC
  Administered 2014-01-31 – 2014-02-02 (×6): 3.375 g via INTRAVENOUS
  Filled 2014-01-31 (×8): qty 50

## 2014-01-31 MED ORDER — AMLODIPINE BESYLATE 5 MG PO TABS
5.0000 mg | ORAL_TABLET | Freq: Every day | ORAL | Status: DC
  Administered 2014-01-31: 5 mg via ORAL
  Filled 2014-01-31 (×2): qty 1

## 2014-01-31 MED ORDER — HYDRALAZINE HCL 50 MG PO TABS
50.0000 mg | ORAL_TABLET | Freq: Three times a day (TID) | ORAL | Status: DC
  Administered 2014-01-31 – 2014-02-11 (×33): 50 mg via ORAL
  Filled 2014-01-31 (×36): qty 1

## 2014-01-31 MED ORDER — PROPOFOL 10 MG/ML IV EMUL
INTRAVENOUS | Status: AC
  Filled 2014-01-31: qty 100

## 2014-01-31 MED ORDER — VERAPAMIL HCL ER 180 MG PO TBCR
180.0000 mg | EXTENDED_RELEASE_TABLET | Freq: Every day | ORAL | Status: DC
  Administered 2014-01-31: 180 mg via ORAL
  Filled 2014-01-31: qty 1

## 2014-01-31 MED ORDER — AMLODIPINE BESYLATE 10 MG PO TABS: 10.0000 mg | ORAL_TABLET | Freq: Every day | ORAL | Status: DC

## 2014-01-31 MED ORDER — CARVEDILOL 12.5 MG PO TABS
12.5000 mg | ORAL_TABLET | Freq: Two times a day (BID) | ORAL | Status: DC
  Filled 2014-01-31 (×2): qty 1

## 2014-01-31 MED ORDER — INSULIN ASPART 100 UNIT/ML ~~LOC~~ SOLN
0.0000 [IU] | SUBCUTANEOUS | Status: DC
  Administered 2014-01-31: 2 [IU] via SUBCUTANEOUS
  Administered 2014-02-01: 5 [IU] via SUBCUTANEOUS
  Administered 2014-02-01 (×2): 3 [IU] via SUBCUTANEOUS

## 2014-01-31 MED ORDER — AMLODIPINE BESYLATE 5 MG PO TABS: 5.0000 mg | ORAL_TABLET | Freq: Every day | ORAL | Status: DC

## 2014-01-31 MED ORDER — VITAMIN K1 10 MG/ML IJ SOLN
5.0000 mg | Freq: Once | INTRAMUSCULAR | Status: AC
  Administered 2014-01-31: 5 mg via SUBCUTANEOUS
  Filled 2014-01-31: qty 0.5

## 2014-01-31 MED ORDER — HYDRALAZINE HCL 20 MG/ML IJ SOLN
10.0000 mg | INTRAMUSCULAR | Status: DC | PRN
  Administered 2014-01-31 (×2): 20 mg via INTRAVENOUS
  Administered 2014-02-01: 40 mg via INTRAVENOUS
  Administered 2014-02-01 – 2014-02-03 (×2): 20 mg via INTRAVENOUS
  Filled 2014-01-31: qty 2
  Filled 2014-01-31 (×5): qty 1

## 2014-01-31 MED ORDER — METOPROLOL TARTRATE 1 MG/ML IV SOLN
5.0000 mg | Freq: Four times a day (QID) | INTRAVENOUS | Status: DC
Start: 2014-01-31 — End: 2014-01-31
  Administered 2014-01-31: 5 mg via INTRAVENOUS
  Filled 2014-01-31: qty 5

## 2014-01-31 MED ORDER — PROPOFOL 10 MG/ML IV EMUL
5.0000 ug/kg/min | INTRAVENOUS | Status: DC
  Administered 2014-01-31: 20 ug/kg/min via INTRAVENOUS

## 2014-01-31 MED ORDER — SODIUM CHLORIDE 0.9 % IV SOLN
INTRAVENOUS | Status: DC
  Administered 2014-01-31: 10 mL/h via INTRAVENOUS

## 2014-01-31 MED ORDER — LABETALOL HCL 5 MG/ML IV SOLN
20.0000 mg | INTRAVENOUS | Status: DC | PRN
  Filled 2014-01-31: qty 4

## 2014-01-31 NOTE — Progress Notes (Signed)
Echocardiogram 2D Echocardiogram has been performed.  01/31/2014 8:31 AM Maudry Mayhew, RVT, RDCS, RDMS

## 2014-01-31 NOTE — Progress Notes (Signed)
ANTICOAGULATION CONSULT NOTE - Initial Consult  Pharmacy Consult for Warfarin  Indication: DVT, bilateral   Allergies  Allergen Reactions  . Aspirin Itching    Patient Measurements: Height: 5\' 6"  (167.6 cm) Weight: 167 lb 15.9 oz (76.2 kg) IBW/kg (Calculated) : 63.8  Vital Signs: Temp: 96.4 F (35.8 C) (03/09 0100) Temp src: Core (Comment) (03/09 0100) BP: 174/85 mmHg (03/08 2341) Pulse Rate: 68 (03/09 0100)  Labs:  Recent Labs  01/30/14 1430 01/30/14 1500 01/30/14 2028 01/31/14 0108  HGB 7.5*  --   --   --   HCT 24.0*  --   --   --   PLT 334  --   --   --   APTT  --   --  46*  --   LABPROT  --  37.0* 42.2*  --   INR  --  3.94* 4.68*  --   CREATININE 1.99*  --  1.78*  --   TROPONINI  --   --  <0.30 <0.30    Estimated Creatinine Clearance: 38.8 ml/min (by C-G formula based on Cr of 1.78).   Medical History: Past Medical History  Diagnosis Date  . 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 Hypertension 10/14/2012  . Type 2 diabetes mellitus   . Diabetic nephropathy with proteinuria   . 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 Diastolic heart failure   . Cellulitis   . Convulsions   . Iron deficiency anemia   . Venous thrombosis and embolism   . Anasarca   . Hypoalbuminemia   . Elevated alkaline phosphatase level   . Microcytic anemia   . GI bleed   . Nephrotic syndrome     Assessment: Warfarin PTA for bilateral DVT. INR is 4.68. Other labs as above.   Goal of Therapy:  INR 2-3 Monitor platelets by anticoagulation protocol: Yes   Plan:  -Hold warfarin for now -Daily PT/INR   Narda Bonds 01/31/2014,3:43 AM

## 2014-01-31 NOTE — Consult Note (Signed)
Admit date: 01/30/2014 Referring Physician  Dr. Marijean Bravo Primary Physician  [facility]  NONE Reason for Consultation  S/P asystole/PEA arrest  HPI: This is a 63yo AAM with ah istory of pulmonary TB, HTN, DM type 2 with diabetic nephropathy, chronic diastolic CHF, anasarca, hypoalbuminemia who presented to ER with cardiac arrest.  Apprently at outside facility for rehab following prolonged hospital stay, [name], [name], [name], 1 amp bicarb, 1 amp calcium chorloride, and 3 amps of epi. Transferred to [facility] for possible TTM post cardiac arrest.  PMH Recnt discharge 2/27 following left foot ulcer debridement (MRI at this time did not show osteomyelitis, debridement occurred on 2/23, wound culture with staph, placed on oral cephalexin with plans to discontinue 3/11), C. Diff infx, new DVT diagnosis and placed on coumadin (bilateral, dx on 2/13) , AKI, and Acalculous cholecystitis. 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 Patient with history of malnutrition as well. Since discharge has been on all his meds, no acute issues until today with distended abdomen, diarrhea, and pain.  Since arrival, patient with elevated blood pressure >200, GCS 10-T following commands, with 34 degrees Celsius temp. No arrhythmias, with EKG without evidence of st changes/cardiac ischemia. Patient with normal LVF, dilated RA/RV and mild to moderate pulmonary HTN.  Cardiology is now asked to consult for further evaluation of cardiac arrest.  Initial cardiac enzymes normal. He has severe anemia with  a history of anemia in the past with  last assessment in Feb 2015 and Hbg was 8 on discharge.  He was seen by advanced HF last month for massive volume overload which was felt mainly to be due to nephrotic syndrome.  Patient is a very poor historian but states that he has had CP in the past but currently does not have any.     PMH:   Past Medical History  Diagnosis Date  . 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 Hypertension 10/14/2012  . Type 2 diabetes mellitus   . Diabetic nephropathy with proteinuria   . 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 Diastolic heart failure   . Cellulitis   . Convulsions   . Iron deficiency anemia   . Venous thrombosis and embolism   . Anasarca   . Hypoalbuminemia   . Elevated alkaline phosphatase level   . Microcytic anemia   . GI bleed   . Nephrotic syndrome      PSH:   Past Surgical History  Procedure Laterality Date  . Knee arthroscopy  2010    3 times, right knee  . I&d extremity Left 01/17/2014    Procedure: IRRIGATION AND DEBRIDEMENT  LEFT FOOT;  Surgeon: Wylene Simmer, MD;  Location: [facility];  Service: Orthopedics;  Laterality: Left;    Allergies:  Aspirin Prior to Admit Meds:   Prescriptions prior to admission  Medication Sig Dispense Refill  . carvedilol (COREG) 12.5 MG tablet Take 1 tablet (12.5 mg total) by mouth 2 (two) times daily with a meal.  60 tablet  0  . cephALEXin (KEFLEX) 500 MG capsule Take 1 capsule (500 mg total) by  mouth every 8 (eight) hours.  42 capsule  0  . ferrous sulfate 325 (65 FE) MG tablet Take 1 tablet (325 mg total) by mouth 3 (three) times daily with meals.  90 tablet  3  . hydrALAZINE (APRESOLINE) 50 MG tablet Take 1 tablet (50 mg total) by mouth every 8 (eight) hours.  90 tablet  0  . ipratropium-albuterol (DUONEB) 0.5-2.5 (3) MG/3ML SOLN Take 3 mLs by nebulization every 6 (six) hours as needed.      . isosorbide mononitrate (IMDUR) 30 MG 24 hr tablet Take 1 tablet (30 mg total) by mouth  daily.  30 tablet  0  . omeprazole (PRILOSEC) 20 MG capsule Take 20 mg by mouth daily.      . verapamil (CALAN-SR) 120 MG CR tablet Take 1.5 tablets (180 mg total) by mouth daily.  30 tablet  2  . warfarin (COUMADIN) 5 MG tablet Take 5-7.5 mg by mouth daily. Alternate 5mg  and 7.5mg  every other day       Fam HX:   No family history on file. Social HX:    History   Social History  . Marital Status: Single    Spouse Name: N/A    Number of Children: 5  . 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 Smoking status: Former Smoker -- 20 years    Types: Cigarettes    Quit date: 11/25/1992  . Smokeless tobacco: Never Used  . Alcohol Use: No     Comment: Rarely.  . Drug Use: No  . Sexual Activity: Not on file     Comment: 09/2012: > 15 partners in past 3 years, inconsistent condom use   Other Topics Concern  . Not on file   Social History Narrative   Has a masters degree in education     ROS:  All 11 ROS were addressed and are negative except what is stated in the HPI  Physical Exam: Blood pressure 168/88, pulse 97, temperature 98.8 F (37.1 C), temperature source Core (Comment), resp. rate 18, height 5\' 6"  (1.676 m), weight 166 lb 14.2 oz (75.7 kg), SpO2 100.00%.    General: Well developed, well nourished, in no acute distress Head: Eyes PERRLA, No xanthomas.   Normal cephalic and atramatic  Lungs:   Clear bilaterally to auscultation and percussion. Heart:   HRRR S1 S2 Pulses are 2+ & equal.            No carotid bruit. No JVD.  No abdominal bruits. No femoral bruits. Abdomen: Bowel sounds are positive, abdomen soft and non-tender without masses  Extremities: 2+ pedal edema Neuro: Alert and oriented X 3. Psych:  Good affect, responds appropriately    Labs:   Lab Results  Component Value Date   WBC 7.4 01/31/2014   HGB 7.4* 01/31/2014   HCT 23.4* 01/31/2014   MCV 76.5* 01/31/2014   PLT 376 01/31/2014    Recent  Labs Lab 01/31/14 0410  NA 144  K 3.9  CL 107  CO2 26  BUN 53*  CREATININE 1.57*  CALCIUM 8.8  PROT 6.4  BILITOT 0.3  ALKPHOS 438*  ALT 54*  AST 31  GLUCOSE 88   No results found for this basename: PTT   Lab Results  Component Value Date   INR 5.00* 01/31/2014   INR 4.68* 01/30/2014   INR 3.94* 01/30/2014   Lab Results  Component Value Date   CKTOTAL 155  01/14/2014   TROPONINI <0.30 01/31/2014     Lab Results  Component Value Date   CHOL 175 01/05/2014   CHOL 193 11/06/2012   Lab Results  Component Value Date   HDL 83 01/05/2014   HDL 83 11/06/2012   Lab Results  Component Value Date   LDLCALC 81 01/05/2014   LDLCALC 100* 11/06/2012   Lab Results  Component Value Date   TRIG 53 01/05/2014   TRIG 49 11/06/2012   Lab Results  Component Value Date   CHOLHDL 2.1 01/05/2014   CHOLHDL 2.3 11/06/2012   No results found for this basename: LDLDIRECT      Radiology:  Ct Abdomen Pelvis Wo Contrast  01/30/2014   CLINICAL DATA:  Cardiac arrest.  EXAM: CT CHEST, ABDOMEN AND PELVIS WITHOUT CONTRAST  TECHNIQUE: Multidetector CT imaging of the chest, abdomen and pelvis was performed following the standard protocol without IV contrast.  COMPARISON:  None.  CT CHEST W/O CM dated 01/07/2014; DG CHEST 1 VIEW dated 01/30/2014  FINDINGS: CT CHEST FINDINGS  The endotracheal tube extends into the right mainstem bronchus. This was immediately called to Dr. Dina Rich on 01/30/2014 at 3 p.m. Nasogastric tube extends into the abdomen. There appears to be prominent mediastinal lymph nodes which are similar to the previous examination but poorly characterized due to the lack of contrast and motion. There are small bilateral pleural effusions, left side greater than right. No significant pericardial fluid. There is diffuse subcutaneous edema. There is compressive atelectasis in lower lobes bilaterally. Patchy densities in left upper lung. Airspace densities within the right middle lobe. There are subtle  densities in the right upper lung. No evidence for a large pneumothorax. No acute bone abnormality.  CT ABDOMEN AND PELVIS FINDINGS  There is no evidence for free air. Nasogastric tube extends into the distal stomach. There is diffuse subcutaneous edema. There is a small amount of abdominal ascites. Unenhanced CT was performed per clinician order. Lack of IV contrast limits sensitivity and specificity, especially for evaluation of abdominal/pelvic solid viscera. Gallbladder may be mildly distended but poorly characterized. No gross abnormality to the liver or spleen. Limited evaluation of the pancreas and adrenal glands. No gross abnormality to the kidneys. There are prominent small bowel loops in the left abdomen with mesenteric edema. Bowel wall thickening, particularly on sequence 2, image 86. Fluid in the urinary bladder. No acute bone abnormality.  IMPRESSION: The study is very limited due to the lack of contrast and motion.  Prominent small bowel loops in the left abdomen with mesenteric edema. There is concern for bowel wall thickening in this area. Based on the history of cardiac arrest, ischemic bowel is in the differential diagnosis. Findings could also be related to an infectious enteritis.  Bilateral small pleural effusions with patchy parenchymal lung densities. Findings could represent atelectasis and asymmetric edema. Infection cannot excluded, particularly in the right middle lobe.  Endotracheal tube in the right mainstem bronchus as described.  Small amount of abdominal ascites and diffuse subcutaneous edema. Findings are concerning for anasarca.  Mild distention of the gallbladder.   Electronically Signed   By: Markus Daft M.D.   On: 01/30/2014 15:18   Dg Chest 1 View  01/30/2014   CLINICAL DATA:  Status post cardiac arrest ; assess endotracheal tube and nasogastric tube position  EXAM: CHEST - 1 VIEW  COMPARISON:  CT CHEST W/O CM dated 01/30/2014  FINDINGS: The trachea has undergone interval  intubation. The tip of the ET tube lies  in the proximal right mainstem bronchus. The lung volumes are reasonably well maintained. There are coarse interstitial markings bilaterally with near confluence at the right lung base.  The esophagogastric tube tip and proximal port project below the inferior margin of the film. The cardiac silhouette is mildly enlarged. The pulmonary vascularity is not engorged. External pacemaker pads are present.  IMPRESSION: 1. The endotracheal tube tip is at the margin of the right main stem bronchus and withdrawal by 5 cm is recommended. 2. The esophagogastric tube appears to be in appropriate position. 3. The pulmonary interstitial markings are mildly increased bilaterally especially on the right. Confluent density at the right lung base suggests atelectasis or pneumonia. 4. These results were called by telephone at the time of interpretation on 01/30/2014 at 2:55 PM to Doylene Canard, RN,, who verbally acknowledged these results.   Electronically Signed   By: David  Martinique   On: 01/30/2014 14:56   Dg Abd 1 View  01/30/2014   CLINICAL DATA:  Abdominal tenderness and loose stools.  EXAM: ABDOMEN - 1 VIEW  COMPARISON:  DG ABD PORTABLE 1V dated 01/07/2014  FINDINGS: The bowel gas pattern is normal. No obstruction or significant ileus is visualized. No abnormal calcifications or soft tissue abnormalities are identified. Mild degenerative changes are present of the lower lumbar spine.  IMPRESSION: Unremarkable abdominal radiograph.   Electronically Signed   By: Aletta Edouard M.D.   On: 01/30/2014 14:24   Ct Head Wo Contrast  01/30/2014   CLINICAL DATA:  Status post cardiac arrest, query intracranial hemorrhage.  EXAM: CT HEAD WITHOUT CONTRAST  TECHNIQUE: Contiguous axial images were obtained from the base of the skull through the vertex without intravenous contrast.  COMPARISON:  CT HEAD W/O CM dated 01/07/2014  FINDINGS: The brainstem, cerebellum, cerebral peduncles, thalamus, basal  ganglia, basilar cisterns, and ventricular system appear within normal limits. Periventricular white matter and corona radiata hypodensities favor chronic ischemic microvascular white matter disease. No intracranial hemorrhage, mass lesion, or acute CVA. Mucosal thickening in the nasal cavity noted. The mild chronic maxillary, ethmoid, and left sphenoid sinusitis.  IMPRESSION: 1. No intracranial hemorrhage, findings of acute CVA, or other acute intracranial abnormality. 2. Chronic paranasal sinusitis with mucosal swelling and frothy fluid in the nasal cavity.   Electronically Signed   By: Sherryl Barters M.D.   On: 01/30/2014 16:04   Ct Chest Wo Contrast  01/30/2014   CLINICAL DATA:  Cardiac arrest.  EXAM: CT CHEST, ABDOMEN AND PELVIS WITHOUT CONTRAST  TECHNIQUE: Multidetector CT imaging of the chest, abdomen and pelvis was performed following the standard protocol without IV contrast.  COMPARISON:  None.  CT CHEST W/O CM dated 01/07/2014; DG CHEST 1 VIEW dated 01/30/2014  FINDINGS: CT CHEST FINDINGS  The endotracheal tube extends into the right mainstem bronchus. This was immediately called to Dr. Dina Rich on 01/30/2014 at 3 p.m. Nasogastric tube extends into the abdomen. There appears to be prominent mediastinal lymph nodes which are similar to the previous examination but poorly characterized due to the lack of contrast and motion. There are small bilateral pleural effusions, left side greater than right. No significant pericardial fluid. There is diffuse subcutaneous edema. There is compressive atelectasis in lower lobes bilaterally. Patchy densities in left upper lung. Airspace densities within the right middle lobe. There are subtle densities in the right upper lung. No evidence for a large pneumothorax. No acute bone abnormality.  CT ABDOMEN AND PELVIS FINDINGS  There is no evidence for free air. Nasogastric tube  extends into the distal stomach. There is diffuse subcutaneous edema. There is a small amount of  abdominal ascites. Unenhanced CT was performed per clinician order. Lack of IV contrast limits sensitivity and specificity, especially for evaluation of abdominal/pelvic solid viscera. Gallbladder may be mildly distended but poorly characterized. No gross abnormality to the liver or spleen. Limited evaluation of the pancreas and adrenal glands. No gross abnormality to the kidneys. There are prominent small bowel loops in the left abdomen with mesenteric edema. Bowel wall thickening, particularly on sequence 2, image 86. Fluid in the urinary bladder. No acute bone abnormality.  IMPRESSION: The study is very limited due to the lack of contrast and motion.  Prominent small bowel loops in the left abdomen with mesenteric edema. There is concern for bowel wall thickening in this area. Based on the history of cardiac arrest, ischemic bowel is in the differential diagnosis. Findings could also be related to an infectious enteritis.  Bilateral small pleural effusions with patchy parenchymal lung densities. Findings could represent atelectasis and asymmetric edema. Infection cannot excluded, particularly in the right middle lobe.  Endotracheal tube in the right mainstem bronchus as described.  Small amount of abdominal ascites and diffuse subcutaneous edema. Findings are concerning for anasarca.  Mild distention of the gallbladder.   Electronically Signed   By: Markus Daft M.D.   On: 01/30/2014 15:18   US Abdomen Port  01/31/2014   CLINICAL DATA:  Elevated liver function tests.  EXAM: ULTRASOUND PORTABLE ABDOMEN  COMPARISON:  CT of the abdomen and pelvis 01/30/2014. Abdominal ultrasound 01/08/2014.  FINDINGS: Gallbladder:  No gallstones. Gallbladder is only partially distended. Gallbladder wall thickness is slightly increased at 4.4 mm (likely a reflection of under distention). No pericholecystic fluid. Per report from the sonographer, the patient did not exhibit a sonographic Murphy's sign on examination.  Common bile  duct:  Diameter: Normal in caliber measuring 5.6 mm in the porta hepatis.  Liver:  No focal lesion identified. Within normal limits in parenchymal echogenicity. No intrahepatic biliary ductal dilatation.  IVC:  No abnormality visualized.  Pancreas:  Visualized portion unremarkable.  Spleen:  Size and appearance within normal limits. Measures 5.5 cm in length.  Right Kidney:  Length: 12.5 cm. Echogenicity within normal limits. No mass or hydronephrosis visualized.  Left Kidney:  Length: 12.4 cm. Echogenicity within normal limits. No mass or hydronephrosis visualized.  Abdominal aorta:  Largely obscured by overlying bowel gas. Measures up to 2.5 cm in diameter proximally.  Other findings:  Small volume of ascites.  Bilateral pleural effusions.  IMPRESSION: 1. No definite acute findings to account for the patient's symptoms. 2. Although the gallbladder wall appears mildly thickened at 4.4 mm, this is nonspecific and likely related to under distention of the gallbladder on today's examination, as well as a generalized state of anasarca given the presence of ascites and pleural effusions. No definite findings to suggest acute cholecystitis are noted at this time. Additionally, there is no intra or extrahepatic biliary ductal dilatation.   Electronically Signed   By: Vinnie Langton M.D.   On: 01/31/2014 07:18   Dg Chest Port 1 View  01/30/2014   CLINICAL DATA:  Central line placement.  EXAM: PORTABLE CHEST - 1 VIEW  COMPARISON:  01/30/2014 1431 hr  FINDINGS: Right internal jugular central venous line has its tip in the lower superior vena cava. There is no pneumothorax.  Endotracheal tube has been retracted. Tip now lies 3.7 cm above the chronic, well positioned.  Nasogastric tube  is stable passing well into the stomach. Lung base opacity is also unchanged.  IMPRESSION: 1. Right internal jugular central venous line tip in the lower superior vena cava. No pneumothorax. 2. Endotracheal tube well positioned.    Electronically Signed   By: Lajean Manes M.D.   On: 01/30/2014 21:01    EKG:  NSR with nonspecific T wave abnormality  ASSESSMENT:  1.  Cardiac arrest - asystole then to PEA.  He was on beta blockers and verapamil at home.  Need to consider acute PE in the setting of recent DVT and PEA. 2D echo shows normal LVF and EKG shows only nonspecific T wave abnormality.  VQ scan in Feburary was negative for PE.  Cardiac enzymes are normal.  2.  Acute on chronic renal failure - improved 3.  Recent DVT 4.  Diabetic foot ulcer, L with wound infection 5.  HTN - poorly controlled 6.  Seizure disorder on last hospitalization 7.  Acute respiratory failure in the setting of cardiac arrest 8.  Abdominal distention with diarrhea  PLAN:   1.  Check d-dimer and consider VQ scan although he has been anticoagulated 2.  Discussed with Dr. Burt Knack - no other noncardiac etiology for Cardiac arrest so needs to have coronary anatomy defined at some point.  Currently hemodynamically stable.  Will reverse coagulopathy with vitamin K and plan cath in the next 48 hours once INR decreased.  Will start IV Heparin gtt once INR<2 3.  Hold beta blockers and Verapamil for now 4.  Continue hydralazine 25mg  TID and add amlodipine 5 mg daily for BP control since stopping verapamil and coreg 5.  Vitamin K 5mg  SQ now 6.  NPO after MN 7.  Cath in am if INR ok otherwise will have to repeat Vit K and wait until Brooks Sailors, MD  01/31/2014  12:57 PM

## 2014-01-31 NOTE — Procedures (Signed)
**Note De-Identified Analiyah Lechuga Obfuscation** Extubation Procedure Note  Patient Details:   Name: Joshua [name] DOB: Apr 26, 1951 MRN: [id number]   Airway Documentation:     Evaluation  O2 sats: stable throughout Complications: No apparent complications Patient did tolerate procedure well. Bilateral Breath Sounds: Clear;Diminished Suctioning: Airway Yes + leak, no stridor noted  Senta Kantor, Penni Bombard 01/31/2014, 11:35 AM

## 2014-01-31 NOTE — Progress Notes (Signed)
Left anterior/top of foot with Length 4cm with 1cm width and depth of 2cm.  Left bottorm of foot with wound of length of 4cm and width of 1cm and depth of 1cm. No drainage or foul odor.

## 2014-01-31 NOTE — Progress Notes (Signed)
eLink Physician-Brief Progress Note Patient Name: Joshua [name] DOB: 07-26-1951 MRN: [id number]  Date of Service  01/31/2014   HPI/Events of Note   Hypertensive  eICU Interventions  IV lopressor Increase IV hydralazine Hold PO meds until bowel issues clarified   Intervention Category Intermediate Interventions: Hypertension - evaluation and management  ALVA,RAKESH V. 01/31/2014, 6:07 AM

## 2014-01-31 NOTE — Progress Notes (Signed)
[facility] KitchenPULMONARY  / CRITICAL CARE MEDICINE  Name: Joshua [name] MRN: [id number] DOB: 1950-11-26    ADMISSION DATE:  01/30/2014 CONSULTATION DATE:  01/30/2014  REFERRING MD :  [facility] PRIMARY SERVICE: PCCM  CHIEF COMPLAINT:  Asystole / PEA arrest  BRIEF PATIENT DESCRIPTION: 63 yo who was undergoing KUB due to diarrhea and abdominal distention when he developed asystole / PEA arrest with ROSC after 14 minutes.  Transferred to [facility] in consideration for hypothermia.  Neurologically intact on arrival.  Hypothermia protocol aborted.  SIGNIFICANT EVENTS / STUDIES:  3/8  Head CT >>> nad 3/8  Abdomen CT >>> Bowel edema, small amount of ascites, small pleural effusions, bibasilar airspace disase 3/9  US abdomen >>> No definitive findings suggesting cholecystitis 3/0  TTE >>> EF 60, mildly;y dilated RV with mildly reduced systolic function  LINES / TUBES: R IJ TLC 3/8 >>> L Rad AL 3/8 >>> OETT 3/8 >>> 3/8 OGT  3/8 >>> 3/9 Foley 3/8 >>>  CULTURES: 3/8  Blood >>> 3/8  Urine >>>  ANTIBIOTICS: Levaquin 3/8 >>> 3/9 Zosyn 3/9 >>> Vancomycin 3/9 >>>  INTERVAL HISTORY:  Hypertensive overnight.  VITAL SIGNS: Temp:  [93.7 F (34.3 C)-99.1 F (37.3 C)] 99.1 F (37.3 C) (03/09 0900) Pulse Rate:  [54-93] 93 (03/09 0900) Cardiac Rhythm:  [-] Normal sinus rhythm (03/09 0900) Resp:  [0-21] 21 (03/09 0900) BP: (110-196)/(56-96) 171/88 mmHg (03/09 0900) SpO2:  [99 %-100 %] 100 % (03/09 0900) FiO2 (%):  [30 %-100 %] 30 % (03/09 0800) Weight:  [72.1 kg (158 lb 15.2 oz)-76.2 kg (167 lb 15.9 oz)] 75.7 kg (166 lb 14.2 oz) (03/09 0500)  HEMODYNAMICS: CVP:  [9 mmHg] 9 mmHg  VENTILATOR SETTINGS: Vent Mode:  [-] PSV;CPAP FiO2 (%):  [30 %-100 %] 30 % Set Rate:  [14 bmp-18 bmp] 18 bmp Vt Set:  [510 mL-550 mL] 510 mL PEEP:  [5 cmH20] 5 cmH20 Pressure Support:  [5 cmH20-10 cmH20] 10 cmH20 Plateau Pressure:  [21 cmH20-42 cmH20] 32 cmH20  INTAKE / OUTPUT: Intake/Output     03/08 0701 - 03/09 0700 03/09  0701 - 03/10 0700   I.V. (mL/kg) 385.6 (5.1) 68.7 (0.9)   IV Piggyback 200    Total Intake(mL/kg) 585.6 (7.7) 68.7 (0.9)   Urine (mL/kg/hr) 1225    Emesis/NG output 250    Total Output 1475     Net -889.4 +68.7             PHYSICAL EXAMINATION: General:  Appears acutely ill, mechanically ventilated, synchronous Neuro:  Follows commands, nonfocal, cough / gag diminished HEENT:  PERRL, OETT / OGT Cardiovascular:  RRR, no m/r/g Lungs:  Bilateral diminished air entry, no w/r/r Abdomen:  Soft, nontender, bowel sounds diminished Musculoskeletal:  Moves all extremities, edema, surgical dressing LLE Skin:  Intact  LABS: CBC  Recent Labs Lab 01/30/14 1430 01/31/14 0410  WBC 7.2 7.4  HGB 7.5* 7.4*  HCT 24.0* 23.4*  PLT 334 376   Coag's  Recent Labs Lab 01/30/14 1500 01/30/14 2028 01/31/14 0410  APTT  --  46* 48*  INR 3.94* 4.68* 5.00*   BMET  Recent Labs Lab 01/30/14 2028 01/31/14 0330 01/31/14 0410  NA 143 145 144  K 4.1 3.9 3.9  CL 108 107 107  CO2 26 24 26   BUN 59* 54* 53*  CREATININE 1.78* 1.61* 1.57*  GLUCOSE 162* 88 88   Electrolytes  Recent Labs Lab 01/30/14 2028 01/31/14 0330 01/31/14 0410  CALCIUM 8.9 8.8 8.8  MG 2.1  --   --  PHOS 3.9 3.2  --    Sepsis Markers  Recent Labs Lab 01/30/14 2028 01/30/14 2350 01/31/14 0330 01/31/14 0410  LATICACIDVEN 0.7 0.8 0.7  --   PROCALCITON <0.10  --   --  0.11   ABG  Recent Labs Lab 01/30/14 1610 01/30/14 1946 01/30/14 2343  PHART 7.295* 7.358 7.390  PCO2ART 53.3* 47.9* 46.7*  PO2ART 437.0* 212.0* 221.0*   Liver Enzymes  Recent Labs Lab 01/30/14 1430 01/30/14 2028 01/31/14 0330 01/31/14 0410  AST 41* 42*  --  31  ALT 59* 59*  --  54*  ALKPHOS 526* 449*  --  438*  BILITOT 0.3 0.3  --  0.3  ALBUMIN 1.9* 1.7*  1.7* 1.7* 1.6*   Cardiac Enzymes  Recent Labs Lab 01/30/14 2028 01/31/14 0108 01/31/14 0630  TROPONINI <0.30 <0.30 <0.30  PROBNP 9165.0*  --   --     Glucose  Recent Labs Lab 01/30/14 0753 01/30/14 1125 01/30/14 2107 01/31/14 0039 01/31/14 0400 01/31/14 0852  GLUCAP 172* 178* 165* 127* 92 86    IMAGING: Ct Abdomen Pelvis Wo Contrast  01/30/2014   CLINICAL DATA:  Cardiac arrest.  EXAM: CT CHEST, ABDOMEN AND PELVIS WITHOUT CONTRAST  TECHNIQUE: Multidetector CT imaging of the chest, abdomen and pelvis was performed following the standard protocol without IV contrast.  COMPARISON:  None.  CT CHEST W/O CM dated 01/07/2014; DG CHEST 1 VIEW dated 01/30/2014  FINDINGS: CT CHEST FINDINGS  The endotracheal tube extends into the right mainstem bronchus. This was immediately called to Dr. Dina Rich on 01/30/2014 at 3 p.m. Nasogastric tube extends into the abdomen. There appears to be prominent mediastinal lymph nodes which are similar to the previous examination but poorly characterized due to the lack of contrast and motion. There are small bilateral pleural effusions, left side greater than right. No significant pericardial fluid. There is diffuse subcutaneous edema. There is compressive atelectasis in lower lobes bilaterally. Patchy densities in left upper lung. Airspace densities within the right middle lobe. There are subtle densities in the right upper lung. No evidence for a large pneumothorax. No acute bone abnormality.  CT ABDOMEN AND PELVIS FINDINGS  There is no evidence for free air. Nasogastric tube extends into the distal stomach. There is diffuse subcutaneous edema. There is a small amount of abdominal ascites. Unenhanced CT was performed per clinician order. Lack of IV contrast limits sensitivity and specificity, especially for evaluation of abdominal/pelvic solid viscera. Gallbladder may be mildly distended but poorly characterized. No gross abnormality to the liver or spleen. Limited evaluation of the pancreas and adrenal glands. No gross abnormality to the kidneys. There are prominent small bowel loops in the left abdomen with mesenteric edema.  Bowel wall thickening, particularly on sequence 2, image 86. Fluid in the urinary bladder. No acute bone abnormality.  IMPRESSION: The study is very limited due to the lack of contrast and motion.  Prominent small bowel loops in the left abdomen with mesenteric edema. There is concern for bowel wall thickening in this area. Based on the history of cardiac arrest, ischemic bowel is in the differential diagnosis. Findings could also be related to an infectious enteritis.  Bilateral small pleural effusions with patchy parenchymal lung densities. Findings could represent atelectasis and asymmetric edema. Infection cannot excluded, particularly in the right middle lobe.  Endotracheal tube in the right mainstem bronchus as described.  Small amount of abdominal ascites and diffuse subcutaneous edema. Findings are concerning for anasarca.  Mild distention of the gallbladder.  Electronically Signed   By: Markus Daft M.D.   On: 01/30/2014 15:18   Dg Chest 1 View  01/30/2014   CLINICAL DATA:  Status post cardiac arrest ; assess endotracheal tube and nasogastric tube position  EXAM: CHEST - 1 VIEW  COMPARISON:  CT CHEST W/O CM dated 01/30/2014  FINDINGS: The trachea has undergone interval intubation. The tip of the ET tube lies in the proximal right mainstem bronchus. The lung volumes are reasonably well maintained. There are coarse interstitial markings bilaterally with near confluence at the right lung base.  The esophagogastric tube tip and proximal port project below the inferior margin of the film. The cardiac silhouette is mildly enlarged. The pulmonary vascularity is not engorged. External pacemaker pads are present.  IMPRESSION: 1. The endotracheal tube tip is at the margin of the right main stem bronchus and withdrawal by 5 cm is recommended. 2. The esophagogastric tube appears to be in appropriate position. 3. The pulmonary interstitial markings are mildly increased bilaterally especially on the right. Confluent  density at the right lung base suggests atelectasis or pneumonia. 4. These results were called by telephone at the time of interpretation on 01/30/2014 at 2:55 PM to Doylene Canard, RN,, who verbally acknowledged these results.   Electronically Signed   By: David  Martinique   On: 01/30/2014 14:56   Dg Abd 1 View  01/30/2014   CLINICAL DATA:  Abdominal tenderness and loose stools.  EXAM: ABDOMEN - 1 VIEW  COMPARISON:  DG ABD PORTABLE 1V dated 01/07/2014  FINDINGS: The bowel gas pattern is normal. No obstruction or significant ileus is visualized. No abnormal calcifications or soft tissue abnormalities are identified. Mild degenerative changes are present of the lower lumbar spine.  IMPRESSION: Unremarkable abdominal radiograph.   Electronically Signed   By: Aletta Edouard M.D.   On: 01/30/2014 14:24   Ct Head Wo Contrast  01/30/2014   CLINICAL DATA:  Status post cardiac arrest, query intracranial hemorrhage.  EXAM: CT HEAD WITHOUT CONTRAST  TECHNIQUE: Contiguous axial images were obtained from the base of the skull through the vertex without intravenous contrast.  COMPARISON:  CT HEAD W/O CM dated 01/07/2014  FINDINGS: The brainstem, cerebellum, cerebral peduncles, thalamus, basal ganglia, basilar cisterns, and ventricular system appear within normal limits. Periventricular white matter and corona radiata hypodensities favor chronic ischemic microvascular white matter disease. No intracranial hemorrhage, mass lesion, or acute CVA. Mucosal thickening in the nasal cavity noted. The mild chronic maxillary, ethmoid, and left sphenoid sinusitis.  IMPRESSION: 1. No intracranial hemorrhage, findings of acute CVA, or other acute intracranial abnormality. 2. Chronic paranasal sinusitis with mucosal swelling and frothy fluid in the nasal cavity.   Electronically Signed   By: Sherryl Barters M.D.   On: 01/30/2014 16:04   Ct Chest Wo Contrast  01/30/2014   CLINICAL DATA:  Cardiac arrest.  EXAM: CT CHEST, ABDOMEN AND PELVIS  WITHOUT CONTRAST  TECHNIQUE: Multidetector CT imaging of the chest, abdomen and pelvis was performed following the standard protocol without IV contrast.  COMPARISON:  None.  CT CHEST W/O CM dated 01/07/2014; DG CHEST 1 VIEW dated 01/30/2014  FINDINGS: CT CHEST FINDINGS  The endotracheal tube extends into the right mainstem bronchus. This was immediately called to Dr. Dina Rich on 01/30/2014 at 3 p.m. Nasogastric tube extends into the abdomen. There appears to be prominent mediastinal lymph nodes which are similar to the previous examination but poorly characterized due to the lack of contrast and motion. There are small bilateral pleural effusions,  left side greater than right. No significant pericardial fluid. There is diffuse subcutaneous edema. There is compressive atelectasis in lower lobes bilaterally. Patchy densities in left upper lung. Airspace densities within the right middle lobe. There are subtle densities in the right upper lung. No evidence for a large pneumothorax. No acute bone abnormality.  CT ABDOMEN AND PELVIS FINDINGS  There is no evidence for free air. Nasogastric tube extends into the distal stomach. There is diffuse subcutaneous edema. There is a small amount of abdominal ascites. Unenhanced CT was performed per clinician order. Lack of IV contrast limits sensitivity and specificity, especially for evaluation of abdominal/pelvic solid viscera. Gallbladder may be mildly distended but poorly characterized. No gross abnormality to the liver or spleen. Limited evaluation of the pancreas and adrenal glands. No gross abnormality to the kidneys. There are prominent small bowel loops in the left abdomen with mesenteric edema. Bowel wall thickening, particularly on sequence 2, image 86. Fluid in the urinary bladder. No acute bone abnormality.  IMPRESSION: The study is very limited due to the lack of contrast and motion.  Prominent small bowel loops in the left abdomen with mesenteric edema. There is concern  for bowel wall thickening in this area. Based on the history of cardiac arrest, ischemic bowel is in the differential diagnosis. Findings could also be related to an infectious enteritis.  Bilateral small pleural effusions with patchy parenchymal lung densities. Findings could represent atelectasis and asymmetric edema. Infection cannot excluded, particularly in the right middle lobe.  Endotracheal tube in the right mainstem bronchus as described.  Small amount of abdominal ascites and diffuse subcutaneous edema. Findings are concerning for anasarca.  Mild distention of the gallbladder.   Electronically Signed   By: Markus Daft M.D.   On: 01/30/2014 15:18   US Abdomen Port  01/31/2014   CLINICAL DATA:  Elevated liver function tests.  EXAM: ULTRASOUND PORTABLE ABDOMEN  COMPARISON:  CT of the abdomen and pelvis 01/30/2014. Abdominal ultrasound 01/08/2014.  FINDINGS: Gallbladder:  No gallstones. Gallbladder is only partially distended. Gallbladder wall thickness is slightly increased at 4.4 mm (likely a reflection of under distention). No pericholecystic fluid. Per report from the sonographer, the patient did not exhibit a sonographic Murphy's sign on examination.  Common bile duct:  Diameter: Normal in caliber measuring 5.6 mm in the porta hepatis.  Liver:  No focal lesion identified. Within normal limits in parenchymal echogenicity. No intrahepatic biliary ductal dilatation.  IVC:  No abnormality visualized.  Pancreas:  Visualized portion unremarkable.  Spleen:  Size and appearance within normal limits. Measures 5.5 cm in length.  Right Kidney:  Length: 12.5 cm. Echogenicity within normal limits. No mass or hydronephrosis visualized.  Left Kidney:  Length: 12.4 cm. Echogenicity within normal limits. No mass or hydronephrosis visualized.  Abdominal aorta:  Largely obscured by overlying bowel gas. Measures up to 2.5 cm in diameter proximally.  Other findings:  Small volume of ascites.  Bilateral pleural effusions.   IMPRESSION: 1. No definite acute findings to account for the patient's symptoms. 2. Although the gallbladder wall appears mildly thickened at 4.4 mm, this is nonspecific and likely related to under distention of the gallbladder on today's examination, as well as a generalized state of anasarca given the presence of ascites and pleural effusions. No definite findings to suggest acute cholecystitis are noted at this time. Additionally, there is no intra or extrahepatic biliary ductal dilatation.   Electronically Signed   By: Vinnie Langton M.D.   On: 01/31/2014 07:18  Dg Chest Port 1 View  01/30/2014   CLINICAL DATA:  Central line placement.  EXAM: PORTABLE CHEST - 1 VIEW  COMPARISON:  01/30/2014 1431 hr  FINDINGS: Right internal jugular central venous line has its tip in the lower superior vena cava. There is no pneumothorax.  Endotracheal tube has been retracted. Tip now lies 3.7 cm above the chronic, well positioned.  Nasogastric tube is stable passing well into the stomach. Lung base opacity is also unchanged.  IMPRESSION: 1. Right internal jugular central venous line tip in the lower superior vena cava. No pneumothorax. 2. Endotracheal tube well positioned.   Electronically Signed   By: Lajean Manes M.D.   On: 01/30/2014 21:01    ASSESSMENT / PLAN:  PULMONARY A:   Acute respiratory failure in setting of cardiac arrest P: Extubate Supplemental oxygen Goal SpO2>92 IS / flutter valve  CARDIOVASCULAR A:   Asystole / PEA arrest No evidence of ischemia Hypertension PE unlikely as supratherapeutic INR H/o DVT P:  Goal BP<160/90 Restart Hydralazine, Coreg, Imdur, Verapamil Hydralazine PRN Venous Doppler LE BL  RENAL A:   AKI P: Trend BMP Avoid nephrotoxins   GASTROINTESTINAL A:   Abdominal distension / diarrhea in outside facility Nutrition GI Px is not indicated P:   Bedside SLP evaluation after extubated D/c Pepcid  HEMATOLOGIC A:   Stable anemia,  etiology? Therapeutic anticoagulation for Hx DVT Coumadin coagulopathy VTE Px is not indicated  P:  Trend CBC / INR Hold Coumadin  INFECTIOUS A:   Possible UTI Cholecystitis unlikely Possible pseudomembranous colitis  LLE wound infection P:   D/c Levaquin as associated with C. Dif Use Zosyn / Vancomycin to cover for soft tissue infection Wound care consult  ENDOCRINE A:   DM II Hyperglycemia P:  SSI  NEUROLOGIC A:   Intact P:   D/c Propopfol  I have personally obtained history, examined patient, evaluated and interpreted laboratory and imaging results, reviewed medical records, formulated assessment / plan and placed orders.  CRITICAL CARE:  The patient is critically ill with multiple organ systems failure and requires high complexity decision making for assessment and support, frequent evaluation and titration of therapies, application of advanced monitoring technologies and extensive interpretation of multiple databases. Critical Care Time devoted to patient care services described in this note is 40 minutes.   Doree Fudge, MD Pulmonary and Heath Pager: [phone]  01/31/2014, 11:45 AM

## 2014-01-31 NOTE — H&P (Signed)
[facility] KitchenPULMONARY  / CRITICAL CARE MEDICINE  Name: Joshua [name] MRN: [id number] DOB: Aug 01, 1951    ADMISSION DATE:  01/30/2014 CONSULTATION DATE:  3/8  REFERRING MD :  [facility] PRIMARY SERVICE: Pulm-CCM  CHIEF COMPLAINT:  Asystole/PEA arrest  BRIEF PATIENT DESCRIPTION: Patient undergoing outpatient kub due to diarrhea, abdominal distention (from rehab center) when he had asystole/PEA arrest with ROSC in 14 minutes.    SIGNIFICANT EVENTS / STUDIES:  CT head negative   LINES / TUBES: Right IJ 3/8 Left radial A line 3/8 ET tube 3/8  CULTURES: Blood and urine  ANTIBIOTICS: Levaquin 3/8  HISTORY OF PRESENT ILLNESS:   [name], [name], sent to outpatient xray due to abdominal distenion and diarrhea noted by nursing staff.  Patient became unresponsive and found to be in asystole, [name], 1 amp bicarb, 1 amp calcium chorloride, and 3 amps of epi.     Transferred to [facility] for possible TTM post cardiac arrest.  PMH  Recnt discharge 2/27 following left foot ulcer debridement (MRI at this time did not show osteomyelitis, debridement occurred on 2/23, wound culture with staph, placed on oral cephalexin with plans to discontinue 3/11), C. Diff infx, new DVT diagnosis and placed on coumadin (bilateral, dx on 2/13)  , AKI, and Acalculous cholecystitis.  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  Patient with history of malnutrition as well.  Since discharge has been on all his meds, no acute issues until today with distended abdomen, diarrhea, and pain.  Since arrival, patient with elevated blood pressure >200, GCS 10-T following commands, with 34 degrees  Celsius temp.  No arrhythmias, with EKG without evidence of st changes/cardiac ischemia.  Patient with mild reductions in LVEF, IVC not collapsible on exam, otherwise no focal wall motion abnormalities.  Trace pericardial effusion.  Currently making urine.  Intubated at OSH.  PAST MEDICAL HISTORY :  Past Medical History  Diagnosis Date  . 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 Hypertension 10/14/2012  . Type 2 diabetes mellitus   . Diabetic nephropathy with proteinuria   . 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 Diastolic heart failure   . Cellulitis   . Convulsions   . Iron deficiency anemia   . Venous thrombosis and embolism   . Anasarca   . Hypoalbuminemia   . Elevated alkaline phosphatase level   . Microcytic anemia   . GI bleed   . Nephrotic syndrome    Past Surgical History  Procedure Laterality Date  . Knee arthroscopy  2010    3 times, right knee  . I&d extremity Left 01/17/2014    Procedure: IRRIGATION AND DEBRIDEMENT  LEFT FOOT;  Surgeon: Wylene Simmer, MD;  Location: [place];  Service: Orthopedics;  Laterality: Left;   Prior to Admission medications   Medication Sig Start Date End Date Taking? Authorizing Provider  carvedilol (COREG) 12.5 MG tablet Take 1 tablet (12.5 mg total) by mouth 2 (two) times daily with a meal. 01/19/14  Yes Jessee Avers, MD  cephALEXin (KEFLEX) 500 MG capsule Take 1 capsule (500 mg total) by mouth every 8 (eight) hours. 01/21/14 02/04/14 Yes Jessee Avers, MD  ferrous sulfate 325 (65 FE) MG  tablet Take 1 tablet (325 mg total) by mouth 3 (three) times daily with meals. 01/19/14  Yes Jessee Avers, MD  hydrALAZINE (APRESOLINE) 50 MG tablet Take 1 tablet (50 mg total) by mouth every 8 (eight) hours. 01/19/14  Yes Jessee Avers, MD  ipratropium-albuterol (DUONEB) 0.5-2.5 (3) MG/3ML SOLN Take 3 mLs by nebulization every 6 (six) hours as needed.   Yes Historical Provider, MD  isosorbide mononitrate (IMDUR) 30 MG 24 hr tablet Take 1  tablet (30 mg total) by mouth daily. 01/19/14  Yes Jessee Avers, MD  omeprazole (PRILOSEC) 20 MG capsule Take 20 mg by mouth daily.   Yes Historical Provider, MD  verapamil (CALAN-SR) 120 MG CR tablet Take 1.5 tablets (180 mg total) by mouth daily. 01/19/14 01/19/15 Yes Jessee Avers, MD  warfarin (COUMADIN) 5 MG tablet Take 5-7.5 mg by mouth daily. Alternate 5mg  and 7.5mg  every other day   Yes Historical Provider, MD   Allergies  Allergen Reactions  . Aspirin Itching    FAMILY HISTORY:  No family history on file. SOCIAL HISTORY:  reports that he quit smoking about 21 years ago. His smoking use included Cigarettes. He smoked 0.00 packs per day for 20 years. He has never used smokeless tobacco. He reports that he does not drink alcohol or use illicit drugs.  REVIEW OF SYSTEMS:  Unable to give due to medical condition  SUBJECTIVE:   VITAL SIGNS: Temp:  [93.7 F (34.3 C)-95 F (35 C)] 93.7 F (34.3 C) (03/08 1900) Pulse Rate:  [54-63] 63 (03/08 1944) Resp:  [12-20] 14 (03/08 1944) BP: (110-196)/(56-96) 196/91 mmHg (03/08 1944) SpO2:  [100 %] 100 % (03/08 1944) FiO2 (%):  [50 %-100 %] 50 % (03/08 2000) Weight:  [158 lb 15.2 oz (72.1 kg)-167 lb 15.9 oz (76.2 kg)] 167 lb 15.9 oz (76.2 kg) (03/08 1824) HEMODYNAMICS:   VENTILATOR SETTINGS: Vent Mode:  [-] PRVC FiO2 (%):  [50 %-100 %] 50 % Set Rate:  [14 bmp-18 bmp] 18 bmp Vt Set:  [510 mL-550 mL] 510 mL PEEP:  [5 cmH20] 5 cmH20 Plateau Pressure:  [21 cmH20-42 cmH20] 31 cmH20 INTAKE / OUTPUT: Intake/Output     03/08 0701 - 03/09 0700   I.V. (mL/kg) 38.6 (0.5)   Total Intake(mL/kg) 38.6 (0.5)   Urine (mL/kg/hr) 275   Total Output 275   Net -236.4         PHYSICAL EXAMINATION: General:  Lethargic, hypertensive, acutely ill but non toxic. hypothermic Neuro:  Follows commands, pin point pupils but reactive, moves all extremties HEENT:  Frothy secretions, pink moist oral mucosa, an icteric, pupils reactive Cardiovascular:   RRR, sinus bradycardia at times, no st changes, normal S1S2, no MRG Lungs:  Patient with rales on exam, no wheezes or rhonchi, good air movement, no dyssyncrony Abdomen:  Anasarca, non tender, decreased bowel sounds, no ecchymosis Musculoskeletal:  Patient with diffuse edema, le wrapped in bandages secondary to surgical wounds, dry bandages without drainage Skin:  See above, warm dry without petechie or rashes  LABS:  Recent Labs Lab 01/30/14 1430 01/30/14 1500 01/30/14 1610 01/30/14 1946  HGB 7.5*  --   --   --   WBC 7.2  --   --   --   PLT 334  --   --   --   NA 140  --   --   --   K 4.7  --   --   --   CL 105  --   --   --  CO2 26  --   --   --   GLUCOSE 220*  --   --   --   BUN 62*  --   --   --   CREATININE 1.99*  --   --   --   CALCIUM 8.6  --   --   --   AST 41*  --   --   --   ALT 59*  --   --   --   ALKPHOS 526*  --   --   --   BILITOT 0.3  --   --   --   PROT 7.0  --   --   --   ALBUMIN 1.9*  --   --   --   INR  --  3.94*  --   --   LATICACIDVEN 2.2  --   --   --   PHART  --   --  7.295* 7.358  PCO2ART  --   --  53.3* 47.9*  PO2ART  --   --  437.0* 212.0*    Recent Labs Lab 01/29/14 0720 01/29/14 1148 01/29/14 1700 01/29/14 2033 01/30/14 0753  GLUCAP 140* 201* 244* 269* 172*    CXR: Perihilar congestion, no frank pulmonary edema, no pleural effusions  ASSESSMENT / PLAN:  PULMONARY A:  Respiratory Failure Post arrest, intubated for possible hypoxia induced PEA/asystole arrest Unable to protect airway P: <8 cc/kg IDW to avoid barotrauma Peak airway pressures <30 PRVC Avoid dyssynchrony Sedation:  Propofol (avoiding precedex due to bradycardia)   CARDIOVASCULAR A:  Asystole/PEA arrest Unknown cause, possible hypoxia, hypercarbia with acidosis, PE   P:  Discuss with Cardiology in the AM, troponin negative, pending ECHO, no EKG evidence of ischemia  CT of head prior to TTM negative TTM not indicated with patient GCS 10-T Glucose control  with goal 160-180; sub q insulin protocol in place Avoid hypo and hyperoxia; FiO2 to keep SpO2 >95 Follow electrolytes as per protocol Ionized Calcium correction Permissive hypertension, will treat if blood pressure >180 with hydralazine, avoiding ACEI secondary to AKI and bb due to bradycardia Patient with INR >4, no further anticoagulation needed, no evidence of significant V/Q mismatch.  RV is dilated however but no troponin elevation which if a hemodynamically significant PE was present should be significantly elevated  A:  DVT P: Pharmacy consulted for coumadin [facility] Currently supratheraputic  A:  HFpEF Diastolic HF P: Diruesis BB once HR improves Once creatinine stabilizes, will add ACEI Add home long acting nitrates once patient tolerating PO  RENAL A:  AKI Acute on chronic renal failure Multifactorial Shock  Plan: Allow autodiuresis, in the next 24-48 hours may add lasix gtt for more aggressive offloading of fluid as pressure tolerates MAP Goal >75-80 for adequate perfusion, however patient with MAP >100 currently Foley for strict I/O's Avoid nephrotoxic substances Renally dose medications  A:  Electrolytes P: Replaces as needed   GASTROINTESTINAL A:  Hx of GI bleed No melena on exam, stable H/H with elevated INR   P:   On stress ulcer prophylaxis  HEMATOLOGIC A:  Anemia P:  Stable, follow and transfuse if <7  INFECTIOUS A:  Possible UTI/Cholecystitis (vs stasis)/C. Diff P:   Levaquin Follow up serial labs, pending abdominal ultrasound Pending blood, urine cultures, as well as C. Diff due to abdominal distention and diarrhea   A:  Foot Ulcer  P:   Consult wound care for further management If appears infected again, may need amputation  ENDOCRINE A:  Glucose control DM II   P:   Insulin ICU protocol in place  NEUROLOGIC A:  Post Arrest P:   Monitor for seizures (hx of seizures, resolved previously)  I have personally obtained a  history, examined the patient, evaluated laboratory and imaging results, formulated the assessment and plan and placed orders. CRITICAL CARE: The patient is critically ill with multiple organ systems failure and requires high complexity decision making for assessment and support, frequent evaluation and titration of therapies, application of advanced monitoring technologies and extensive interpretation of multiple databases. Critical Care Time devoted to patient care services described in this note is 45 minutes.    Pulmonary and [place] Pager: [phone]  01/30/2014, 8:22 PM

## 2014-01-31 NOTE — Progress Notes (Signed)
Bilateral lower extremity venous duplex:  No evidence of DVT, superficial thrombosis, or Baker's Cyst.   

## 2014-01-31 NOTE — Progress Notes (Signed)
ANTIBIOTIC CONSULT NOTE - INITIAL  Pharmacy Consult for Vancomycin and Zosyn Indication: LLE wound infection  Allergies  Allergen Reactions  . Aspirin Itching    Patient Measurements: Height: 5\' 6"  (167.6 cm) Weight: 166 lb 14.2 oz (75.7 kg) IBW/kg (Calculated) : 63.8  Vital Signs: Temp: 99.1 F (37.3 C) (03/09 0900) Temp src: Core (Comment) (03/09 0800) BP: 187/94 mmHg (03/09 1140) Pulse Rate: 97 (03/09 1115) Intake/Output from previous day: 03/08 0701 - 03/09 0700 In: 585.6 [I.V.:385.6; IV Piggyback:200] Out: 1475 [Urine:1225; Emesis/NG output:250] Intake/Output from this shift: Total I/O In: 68.7 [I.V.:68.7] Out: -   Labs:  Recent Labs  01/30/14 1430 01/30/14 2028 01/31/14 0330 01/31/14 0410  WBC 7.2  --   --  7.4  HGB 7.5*  --   --  7.4*  PLT 334  --   --  376  CREATININE 1.99* 1.78* 1.61* 1.57*   Estimated Creatinine Clearance: 44 ml/min (by C-G formula based on Cr of 1.57).  Medical History: Past Medical History  Diagnosis Date  . 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 Hypertension 10/14/2012  . Type 2 diabetes mellitus   . Diabetic nephropathy with proteinuria   . 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 Diastolic heart failure   . Cellulitis   . Convulsions   . Iron deficiency anemia   . Venous thrombosis and embolism   . Anasarca   . Hypoalbuminemia   . Elevated alkaline phosphatase level   . Microcytic anemia   . GI bleed   . Nephrotic syndrome    Assessment: 62yom s/p cardiac arrest to switch from levaquin to vancomycin and zosyn for LLE wound infection. Patient is in acute renal failure but sCr is trending down.  Levaquin 3/8>>3/9 Vancomycin 3/9>> Zosyn 3/9>>  2/13 blood cx: 1/2 coag negative staph 2/17 left foot wound: MSSA 2/23 left foot abscess: MSSA 3/8 Cdiff: negative 3/8 blood cx: ngtd  Goal of Therapy:  Vancomycin trough level 10-15 mcg/ml  Plan:  1) Vancomycin 1250mg  IV q24 2) Zosyn 3.375g IV q8  (4 hour infusion) 3) Follow renal function, cultures, LOT, level as needed  Deboraha Sprang 01/31/2014,11:51 AM

## 2014-01-31 NOTE — Care Management Note (Addendum)
    Page 1 of 1   02/09/2014     3:13:37 PM   CARE MANAGEMENT NOTE 02/09/2014  Patient:  Joshua [name], Joshua [name]   Account Number:  [id number]  Date Initiated:  01/31/2014  Documentation initiated by:  Elissa Hefty  Subjective/Objective Assessment:   adm w cardiac arrest-vent on adm     Action/Plan:   lives alone, pcp dr Cristie Hem wilson   Anticipated DC Date:  02/08/2014   Anticipated DC Plan:  [place]  CM consult      Choice offered to / List presented to:             Status of service:  Completed, signed off Medicare Important Message given?   (If response is "NO", the following Medicare IM given date fields will be blank) Date Medicare IM given:   Date Additional Medicare IM given:    Discharge Disposition:  [place]  Per UR Regulation:  Reviewed for med. necessity/level of care/duration of stay  If discussed at [place] of Stay Meetings, dates discussed:   02/08/2014  02/10/2014    Comments:  Contact:  Ogunyemi,Kolawole Other [phone] 02-08-14 [place], [place] [phone] Still diuresing 3 x day. Plan will be SNF when medically stable.

## 2014-02-01 ENCOUNTER — Inpatient Hospital Stay (HOSPITAL_COMMUNITY): Payer: Self-pay

## 2014-02-01 DIAGNOSIS — K810 Acute cholecystitis: Secondary | ICD-10-CM

## 2014-02-01 DIAGNOSIS — I824Y9 Acute embolism and thrombosis of unspecified deep veins of unspecified proximal lower extremity: Secondary | ICD-10-CM

## 2014-02-01 DIAGNOSIS — E1129 Type 2 diabetes mellitus with other diabetic kidney complication: Secondary | ICD-10-CM

## 2014-02-01 LAB — GLUCOSE, CAPILLARY
GLUCOSE-CAPILLARY: 104 mg/dL — AB (ref 70–99)
GLUCOSE-CAPILLARY: 155 mg/dL — AB (ref 70–99)
Glucose-Capillary: 117 mg/dL — ABNORMAL HIGH (ref 70–99)
Glucose-Capillary: 185 mg/dL — ABNORMAL HIGH (ref 70–99)
Glucose-Capillary: 211 mg/dL — ABNORMAL HIGH (ref 70–99)
Glucose-Capillary: 235 mg/dL — ABNORMAL HIGH (ref 70–99)

## 2014-02-01 LAB — BASIC METABOLIC PANEL
BUN: 43 mg/dL — ABNORMAL HIGH (ref 6–23)
CO2: 25 mEq/L (ref 19–32)
Calcium: 8.7 mg/dL (ref 8.4–10.5)
Chloride: 108 mEq/L (ref 96–112)
Creatinine, Ser: 1.59 mg/dL — ABNORMAL HIGH (ref 0.50–1.35)
GFR calc non Af Amer: 45 mL/min — ABNORMAL LOW (ref >90)
GFR, EST AFRICAN AMERICAN: 52 mL/min — AB (ref >90)
Glucose, Bld: 113 mg/dL — ABNORMAL HIGH (ref 70–99)
POTASSIUM: 4.2 meq/L (ref 3.7–5.3)
Sodium: 145 mEq/L (ref 137–147)

## 2014-02-01 LAB — CBC
HCT: 25.2 % — ABNORMAL LOW (ref 39.0–52.0)
Hemoglobin: 7.9 g/dL — ABNORMAL LOW (ref 13.0–17.0)
MCH: 24.4 pg — AB (ref 26.0–34.0)
MCHC: 31.3 g/dL (ref 30.0–36.0)
MCV: 77.8 fL — ABNORMAL LOW (ref 78.0–100.0)
Platelets: 392 10*3/uL (ref 150–400)
RBC: 3.24 MIL/uL — ABNORMAL LOW (ref 4.22–5.81)
RDW: 21.6 % — AB (ref 11.5–15.5)
WBC: 8.3 10*3/uL (ref 4.0–10.5)

## 2014-02-01 LAB — PLATELET INHIBITION P2Y12: Platelet Function  P2Y12: 323 [PRU] (ref 194–418)

## 2014-02-01 LAB — PROTIME-INR
INR: 2.64 — AB (ref 0.00–1.49)
INR: 2.12 — AB (ref 0.00–1.49)
PROTHROMBIN TIME: 23.1 s — AB (ref 11.6–15.2)
Prothrombin Time: 27.3 seconds — ABNORMAL HIGH (ref 11.6–15.2)

## 2014-02-01 LAB — D-DIMER, QUANTITATIVE: D-Dimer, Quant: 1.76 ug/mL-FEU — ABNORMAL HIGH (ref 0.00–0.48)

## 2014-02-01 LAB — APTT: APTT: 52 s — AB (ref 24–37)

## 2014-02-01 MED ORDER — VITAMIN K1 10 MG/ML IJ SOLN
5.0000 mg | Freq: Once | INTRAMUSCULAR | Status: AC
  Administered 2014-02-01: 5 mg via SUBCUTANEOUS
  Filled 2014-02-01: qty 0.5

## 2014-02-01 MED ORDER — TECHNETIUM TC 99M DIETHYLENETRIAME-PENTAACETIC ACID: 40.0000 | Freq: Once | INTRAVENOUS | Status: AC | PRN

## 2014-02-01 MED ORDER — SODIUM CHLORIDE 0.9 % IV SOLN: 250.0000 mL | INTRAVENOUS | Status: DC | PRN

## 2014-02-01 MED ORDER — SODIUM CHLORIDE 0.9 % IJ SOLN: 3.0000 mL | INTRAMUSCULAR | Status: DC | PRN

## 2014-02-01 MED ORDER — SODIUM CHLORIDE 0.9 % IJ SOLN
3.0000 mL | Freq: Two times a day (BID) | INTRAMUSCULAR | Status: DC
  Administered 2014-02-02: 3 mL via INTRAVENOUS

## 2014-02-01 MED ORDER — INSULIN ASPART 100 UNIT/ML ~~LOC~~ SOLN: 0.0000 [IU] | Freq: Three times a day (TID) | SUBCUTANEOUS | Status: DC

## 2014-02-01 MED ORDER — TECHNETIUM TO 99M ALBUMIN AGGREGATED
6.0000 | Freq: Once | INTRAVENOUS | Status: AC | PRN
  Administered 2014-02-01: 6 via INTRAVENOUS

## 2014-02-01 MED ORDER — SODIUM CHLORIDE 0.9 % IV SOLN
INTRAVENOUS | Status: DC
  Administered 2014-02-02: 06:00:00 via INTRAVENOUS

## 2014-02-01 MED ORDER — AMLODIPINE BESYLATE 10 MG PO TABS
10.0000 mg | ORAL_TABLET | Freq: Every day | ORAL | Status: DC
  Administered 2014-02-01 – 2014-02-10 (×10): 10 mg via ORAL
  Filled 2014-02-01 (×12): qty 1

## 2014-02-01 MED ORDER — INSULIN ASPART 100 UNIT/ML ~~LOC~~ SOLN
0.0000 [IU] | Freq: Three times a day (TID) | SUBCUTANEOUS | Status: DC
  Administered 2014-02-02: 2 [IU] via SUBCUTANEOUS
  Administered 2014-02-02 – 2014-02-03 (×3): 3 [IU] via SUBCUTANEOUS
  Administered 2014-02-03: 8 [IU] via SUBCUTANEOUS
  Administered 2014-02-04 (×2): 3 [IU] via SUBCUTANEOUS
  Administered 2014-02-05: 2 [IU] via SUBCUTANEOUS
  Administered 2014-02-05: 5 [IU] via SUBCUTANEOUS
  Administered 2014-02-05: 2 [IU] via SUBCUTANEOUS
  Administered 2014-02-06: 5 [IU] via SUBCUTANEOUS
  Administered 2014-02-06 – 2014-02-07 (×5): 3 [IU] via SUBCUTANEOUS
  Administered 2014-02-08: 2 [IU] via SUBCUTANEOUS
  Administered 2014-02-08 (×2): 3 [IU] via SUBCUTANEOUS

## 2014-02-01 MED FILL — Medication: Qty: 1 | Status: AC

## 2014-02-01 NOTE — Progress Notes (Signed)
[facility]ULMON[facility]  / CRITICAL CARE MEDICINE  Name: Joshua [name] MRN: [id number] DOB: 09-Dec-1950    ADMISSION DATE:  01/30/2014 CONSULTATION DATE:  01/30/2014  REFERRING MD :  [facility] PRIMARY SERVICE: PCCM  CHIEF COMPLAINT:  Asystole / PEA arrest  BRIEF PATIENT DESCRIPTION: 63 yo who was undergoing KUB due to diarrhea and abdominal distention when he developed asystole / PEA arrest with ROSC after 14 minutes.  Transferred to [facility] in consideration for hypothermia.  Neurologically intact on arrival.  Hypothermia protocol aborted.  SIGNIFICANT EVENTS / STUDIES:  3/8  Head CT >>> nad 3/8  Abdomen CT >>> Bowel edema, small amount of ascites, small pleural effusions, bibasilar airspace disase 3/9  US abdomen >>> No definitive findings suggesting cholecystitis 3/9  TTE >>> EF 60, mildly;y dilated RV with mildly reduced systolic function [id number] VQ scan >>> 3/11 Cardiac cath >>>  LINES / TUBES: R IJ TLC 3/8 >>> L Rad AL 3/8 >>> 3/10 OETT 3/8 >>> 3/8 OGT  3/8 >>> 3/9 Foley 3/8 >>> 3/9  CULTURES: 3/8  MRSA PCR >>> neg 3/8  Blood >>>  ANTIBIOTICS: Levaquin 3/8 >>> 3/9 Zosyn 3/9 >>> Vancomycin 3/9 >>>  INTERVAL HISTORY:  Remains extubated.  VITAL SIGNS: Temp:  [97.8 F (36.6 C)-99.1 F (37.3 C)] 97.8 F (36.6 C) (03/10 0800) Pulse Rate:  [64-97] 73 (03/10 0800) Cardiac Rhythm:  [-] Normal sinus rhythm (03/10 0400) Resp:  [0-23] 0 (03/10 0800) BP: (151-196)/(68-94) 196/81 mmHg (03/10 0602) SpO2:  [94 %-100 %] 97 % (03/10 0800) Weight:  [78.4 kg (172 lb 13.5 oz)] 78.4 kg (172 lb 13.5 oz) (03/10 0500)  HEMODYNAMICS:   VENTILATOR SETTINGS: Vent Mode:  [-]  PEEP:  [5 cmH20] 5 cmH20 Pressure Support:  [10 cmH20] 10 cmH20  INTAKE / OUTPUT: Intake/Output     03/09 0701 - 03/10 0700 03/10 0701 - 03/11 0700   P.O. 1140    I.V. (mL/kg) 586.1 (7.5)    IV Piggyback 400    Total Intake(mL/kg) 2126.1 (27.1)    Urine (mL/kg/hr) 1480 (0.8)    Emesis/NG output     Total Output 1480     Net  +646.1          Urine Occurrence 1 x         PHYSICAL EXAMINATION: General:  No distress Neuro:  Awake, alert HEENT:  PERRL, no JVD Cardiovascular:  RRR, no m/r/g Lungs:  Bilateral diminished air entry, no added sounds Abdomen:  Soft, nontender, bowel sounds normal Musculoskeletal:  Moves all extremities, edema, surgical dressing LLE Skin:  Intact  LABS: CBC  Recent Labs Lab 01/30/14 1430 01/31/14 0410 02/01/14 0400  WBC 7.2 7.4 8.3  HGB 7.5* 7.4* 7.9*  HCT 24.0* 23.4* 25.2*  PLT 334 376 392   Coag's  Recent Labs Lab 01/30/14 2028 01/31/14 0410 02/01/14 0400  APTT 46* 48* 52*  INR 4.68* 5.00* 2.64*   BMET  Recent Labs Lab 01/31/14 0330 01/31/14 0410 02/01/14 0400  NA 145 144 145  K 3.9 3.9 4.2  CL 107 107 108  CO2 24 26 25   BUN 54* 53* 43*  CREATININE 1.61* 1.57* 1.59*  GLUCOSE 88 88 113*   Electrolytes  Recent Labs Lab 01/30/14 2028 01/31/14 0330 01/31/14 0410 02/01/14 0400  CALCIUM 8.9 8.8 8.8 8.7  MG 2.1  --   --   --   PHOS 3.9 3.2  --   --    Sepsis Markers  Recent Labs Lab 01/30/14 2028 01/30/14 2350 01/31/14  0330 01/31/14 0410  LATICACIDVEN 0.7 0.8 0.7  --   PROCALCITON <0.10  --   --  0.11   ABG  Recent Labs Lab 01/30/14 1610 01/30/14 1946 01/30/14 2343  PHART 7.295* 7.358 7.390  PCO2ART 53.3* 47.9* 46.7*  PO2ART 437.0* 212.0* 221.0*   Liver Enzymes  Recent Labs Lab 01/30/14 1430 01/30/14 2028 01/31/14 0330 01/31/14 0410  AST 41* 42*  --  31  ALT 59* 59*  --  54*  ALKPHOS 526* 449*  --  438*  BILITOT 0.3 0.3  --  0.3  ALBUMIN 1.9* 1.7*  1.7* 1.7* 1.6*   Cardiac Enzymes  Recent Labs Lab 01/30/14 2028 01/31/14 0108 01/31/14 0630  TROPONINI <0.30 <0.30 <0.30  PROBNP 9165.0*  --   --    Glucose  Recent Labs Lab 01/31/14 1139 01/31/14 1601 01/31/14 2014 01/31/14 2348 02/01/14 0406 02/01/14 0758  GLUCAP 99 122* 107* 117* 104* 185*   IMAGING: Ct Abdomen Pelvis Wo Contrast  01/30/2014    CLINICAL DATA:  Cardiac arrest.  EXAM: CT CHEST, ABDOMEN AND PELVIS WITHOUT CONTRAST  TECHNIQUE: Multidetector CT imaging of the chest, abdomen and pelvis was performed following the standard protocol without IV contrast.  COMPARISON:  None.  CT CHEST W/O CM dated 01/07/2014; DG CHEST 1 VIEW dated 01/30/2014  FINDINGS: CT CHEST FINDINGS  The endotracheal tube extends into the right mainstem bronchus. This was immediately called to Dr. Dina Rich on 01/30/2014 at 3 p.m. Nasogastric tube extends into the abdomen. There appears to be prominent mediastinal lymph nodes which are similar to the previous examination but poorly characterized due to the lack of contrast and motion. There are small bilateral pleural effusions, left side greater than right. No significant pericardial fluid. There is diffuse subcutaneous edema. There is compressive atelectasis in lower lobes bilaterally. Patchy densities in left upper lung. Airspace densities within the right middle lobe. There are subtle densities in the right upper lung. No evidence for a large pneumothorax. No acute bone abnormality.  CT ABDOMEN AND PELVIS FINDINGS  There is no evidence for free air. Nasogastric tube extends into the distal stomach. There is diffuse subcutaneous edema. There is a small amount of abdominal ascites. Unenhanced CT was performed per clinician order. Lack of IV contrast limits sensitivity and specificity, especially for evaluation of abdominal/pelvic solid viscera. Gallbladder may be mildly distended but poorly characterized. No gross abnormality to the liver or spleen. Limited evaluation of the pancreas and adrenal glands. No gross abnormality to the kidneys. There are prominent small bowel loops in the left abdomen with mesenteric edema. Bowel wall thickening, particularly on sequence 2, image 86. Fluid in the urinary bladder. No acute bone abnormality.  IMPRESSION: The study is very limited due to the lack of contrast and motion.  Prominent small  bowel loops in the left abdomen with mesenteric edema. There is concern for bowel wall thickening in this area. Based on the history of cardiac arrest, ischemic bowel is in the differential diagnosis. Findings could also be related to an infectious enteritis.  Bilateral small pleural effusions with patchy parenchymal lung densities. Findings could represent atelectasis and asymmetric edema. Infection cannot excluded, particularly in the right middle lobe.  Endotracheal tube in the right mainstem bronchus as described.  Small amount of abdominal ascites and diffuse subcutaneous edema. Findings are concerning for anasarca.  Mild distention of the gallbladder.   Electronically Signed   By: Markus Daft M.D.   On: 01/30/2014 15:18   Dg Chest 1 View  01/30/2014   CLINICAL DATA:  Status post cardiac arrest ; assess endotracheal tube and nasogastric tube position  EXAM: CHEST - 1 VIEW  COMPARISON:  CT CHEST W/O CM dated 01/30/2014  FINDINGS: The trachea has undergone interval intubation. The tip of the ET tube lies in the proximal right mainstem bronchus. The lung volumes are reasonably well maintained. There are coarse interstitial markings bilaterally with near confluence at the right lung base.  The esophagogastric tube tip and proximal port project below the inferior margin of the film. The cardiac silhouette is mildly enlarged. The pulmonary vascularity is not engorged. External pacemaker pads are present.  IMPRESSION: 1. The endotracheal tube tip is at the margin of the right main stem bronchus and withdrawal by 5 cm is recommended. 2. The esophagogastric tube appears to be in appropriate position. 3. The pulmonary interstitial markings are mildly increased bilaterally especially on the right. Confluent density at the right lung base suggests atelectasis or pneumonia. 4. These results were called by telephone at the time of interpretation on 01/30/2014 at 2:55 PM to Doylene Canard, RN,, who verbally acknowledged these  results.   Electronically Signed   By: David  Martinique   On: 01/30/2014 14:56   Dg Abd 1 View  01/30/2014   CLINICAL DATA:  Abdominal tenderness and loose stools.  EXAM: ABDOMEN - 1 VIEW  COMPARISON:  DG ABD PORTABLE 1V dated 01/07/2014  FINDINGS: The bowel gas pattern is normal. No obstruction or significant ileus is visualized. No abnormal calcifications or soft tissue abnormalities are identified. Mild degenerative changes are present of the lower lumbar spine.  IMPRESSION: Unremarkable abdominal radiograph.   Electronically Signed   By: Aletta Edouard M.D.   On: 01/30/2014 14:24   Ct Head Wo Contrast  01/30/2014   CLINICAL DATA:  Status post cardiac arrest, query intracranial hemorrhage.  EXAM: CT HEAD WITHOUT CONTRAST  TECHNIQUE: Contiguous axial images were obtained from the base of the skull through the vertex without intravenous contrast.  COMPARISON:  CT HEAD W/O CM dated 01/07/2014  FINDINGS: The brainstem, cerebellum, cerebral peduncles, thalamus, basal ganglia, basilar cisterns, and ventricular system appear within normal limits. Periventricular white matter and corona radiata hypodensities favor chronic ischemic microvascular white matter disease. No intracranial hemorrhage, mass lesion, or acute CVA. Mucosal thickening in the nasal cavity noted. The mild chronic maxillary, ethmoid, and left sphenoid sinusitis.  IMPRESSION: 1. No intracranial hemorrhage, findings of acute CVA, or other acute intracranial abnormality. 2. Chronic paranasal sinusitis with mucosal swelling and frothy fluid in the nasal cavity.   Electronically Signed   By: Sherryl Barters M.D.   On: 01/30/2014 16:04   Ct Chest Wo Contrast  01/30/2014   CLINICAL DATA:  Cardiac arrest.  EXAM: CT CHEST, ABDOMEN AND PELVIS WITHOUT CONTRAST  TECHNIQUE: Multidetector CT imaging of the chest, abdomen and pelvis was performed following the standard protocol without IV contrast.  COMPARISON:  None.  CT CHEST W/O CM dated 01/07/2014; DG CHEST 1  VIEW dated 01/30/2014  FINDINGS: CT CHEST FINDINGS  The endotracheal tube extends into the right mainstem bronchus. This was immediately called to Dr. Dina Rich on 01/30/2014 at 3 p.m. Nasogastric tube extends into the abdomen. There appears to be prominent mediastinal lymph nodes which are similar to the previous examination but poorly characterized due to the lack of contrast and motion. There are small bilateral pleural effusions, left side greater than right. No significant pericardial fluid. There is diffuse subcutaneous edema. There is compressive atelectasis in lower lobes  bilaterally. Patchy densities in left upper lung. Airspace densities within the right middle lobe. There are subtle densities in the right upper lung. No evidence for a large pneumothorax. No acute bone abnormality.  CT ABDOMEN AND PELVIS FINDINGS  There is no evidence for free air. Nasogastric tube extends into the distal stomach. There is diffuse subcutaneous edema. There is a small amount of abdominal ascites. Unenhanced CT was performed per clinician order. Lack of IV contrast limits sensitivity and specificity, especially for evaluation of abdominal/pelvic solid viscera. Gallbladder may be mildly distended but poorly characterized. No gross abnormality to the liver or spleen. Limited evaluation of the pancreas and adrenal glands. No gross abnormality to the kidneys. There are prominent small bowel loops in the left abdomen with mesenteric edema. Bowel wall thickening, particularly on sequence 2, image 86. Fluid in the urinary bladder. No acute bone abnormality.  IMPRESSION: The study is very limited due to the lack of contrast and motion.  Prominent small bowel loops in the left abdomen with mesenteric edema. There is concern for bowel wall thickening in this area. Based on the history of cardiac arrest, ischemic bowel is in the differential diagnosis. Findings could also be related to an infectious enteritis.  Bilateral small pleural  effusions with patchy parenchymal lung densities. Findings could represent atelectasis and asymmetric edema. Infection cannot excluded, particularly in the right middle lobe.  Endotracheal tube in the right mainstem bronchus as described.  Small amount of abdominal ascites and diffuse subcutaneous edema. Findings are concerning for anasarca.  Mild distention of the gallbladder.   Electronically Signed   By: Markus Daft M.D.   On: 01/30/2014 15:18   US Abdomen Port  01/31/2014   CLINICAL DATA:  Elevated liver function tests.  EXAM: ULTRASOUND PORTABLE ABDOMEN  COMPARISON:  CT of the abdomen and pelvis 01/30/2014. Abdominal ultrasound 01/08/2014.  FINDINGS: Gallbladder:  No gallstones. Gallbladder is only partially distended. Gallbladder wall thickness is slightly increased at 4.4 mm (likely a reflection of under distention). No pericholecystic fluid. Per report from the sonographer, the patient did not exhibit a sonographic Murphy's sign on examination.  Common bile duct:  Diameter: Normal in caliber measuring 5.6 mm in the porta hepatis.  Liver:  No focal lesion identified. Within normal limits in parenchymal echogenicity. No intrahepatic biliary ductal dilatation.  IVC:  No abnormality visualized.  Pancreas:  Visualized portion unremarkable.  Spleen:  Size and appearance within normal limits. Measures 5.5 cm in length.  Right Kidney:  Length: 12.5 cm. Echogenicity within normal limits. No mass or hydronephrosis visualized.  Left Kidney:  Length: 12.4 cm. Echogenicity within normal limits. No mass or hydronephrosis visualized.  Abdominal aorta:  Largely obscured by overlying bowel gas. Measures up to 2.5 cm in diameter proximally.  Other findings:  Small volume of ascites.  Bilateral pleural effusions.  IMPRESSION: 1. No definite acute findings to account for the patient's symptoms. 2. Although the gallbladder wall appears mildly thickened at 4.4 mm, this is nonspecific and likely related to under distention of the  gallbladder on today's examination, as well as a generalized state of anasarca given the presence of ascites and pleural effusions. No definite findings to suggest acute cholecystitis are noted at this time. Additionally, there is no intra or extrahepatic biliary ductal dilatation.   Electronically Signed   By: Vinnie Langton M.D.   On: 01/31/2014 07:18   Dg Chest Port 1 View  01/30/2014   CLINICAL DATA:  Central line placement.  EXAM: PORTABLE CHEST -  1 VIEW  COMPARISON:  01/30/2014 1431 hr  FINDINGS: Right internal jugular central venous line has its tip in the lower superior vena cava. There is no pneumothorax.  Endotracheal tube has been retracted. Tip now lies 3.7 cm above the chronic, well positioned.  Nasogastric tube is stable passing well into the stomach. Lung base opacity is also unchanged.  IMPRESSION: 1. Right internal jugular central venous line tip in the lower superior vena cava. No pneumothorax. 2. Endotracheal tube well positioned.   Electronically Signed   By: Lajean Manes M.D.   On: 01/30/2014 21:01   ASSESSMENT / PLAN:  PULMONARY A:   Acute respiratory failure in setting of cardiac arrest, resolved Must r/o PE as no overt cause for cardiac arrest P: Goal SpO2>92 Supplemental oxygen Incentive spirometry / flutter valve VQ scan  CARDIOVASCULAR A:   Asystole / PEA arrest No evidence of ischemia Hypertension H/o DVT P:  Goal BP<160/90 Appreciate Cardiology input Hold Coreg, Verapamil Hydralazine, Imdur Hydralazine PRN Venous Doppler LE Cardiac cath when INR normolised  RENAL A:   AKI P: Trend BMP Avoid nephrotoxins  IVF pre cath  GASTROINTESTINAL A:   Abdominal distension / diarrhea in outside facility - resolved Mildly elevated liver enzymes, stable Nutrition GI Px is not indicated P:   NPO for VQ scan, then diet NPO after midnight for cardiac cath  HEMATOLOGIC A:   Stable anemia, etiology? Therapeutic anticoagulation for Hx DVT Coumadin  coagulopathy VTE Px is not indicated  P:  Trend CBC / INR Hold Coumadin Vit K given Heparin once INR<2  INFECTIOUS A:   UTI Doubt pseudomembranous colitis  Possible LLE wound infection P:   D/c Vancomycin as MRSA screen negative Continue Zosyn Wound care consult  ENDOCRINE A:   DM II Hyperglycemia P:  SSI  NEUROLOGIC A:   Intact P:   No intervention required  I have personally obtained history, examined patient, evaluated and interpreted laboratory and imaging results, reviewed medical records, formulated assessment / plan and placed orders.  Doree Fudge, MD Pulmonary and [place] Pager: [phone]  02/01/2014, 9:05 AM

## 2014-02-01 NOTE — Progress Notes (Addendum)
SUBJECTIVE:  No complaints  OBJECTIVE:   Vitals:   Filed Vitals:   02/01/14 0400 02/01/14 0500 02/01/14 0600 02/01/14 0602  BP:    196/81  Pulse: 73 73 73   Temp: 98.5 F (36.9 C)     TempSrc: Oral     Resp: 8 12 6    Height:      Weight:  172 lb 13.5 oz (78.4 kg)    SpO2: 97% 96% 99%    I&O's:   Intake/Output Summary (Last 24 hours) at 02/01/14 0747 Last data filed at 02/01/14 0600  Gross per 24 hour  Intake 2126.1 ml  Output   1480 ml  Net  646.1 ml   TELEMETRY: Reviewed telemetry pt in NSR:     PHYSICAL EXAM General: Well developed, well nourished, in no acute distress Head: Eyes PERRLA, No xanthomas.   Normal cephalic and atramatic  Lungs:   Clear bilaterally to auscultation and percussion. Heart:   HRRR S1 S2 Pulses are 2+ & equal. Abdomen: Bowel sounds are positive, abdomen soft and non-tender without masses Extremities:  1+ pedal edema Neuro: Alert and oriented X 3. Psych:  Good affect, responds appropriately   LABS: Basic Metabolic Panel:  Recent Labs  01/30/14 2028 01/31/14 0330 01/31/14 0410 02/01/14 0400  NA 143 145 144 145  K 4.1 3.9 3.9 4.2  CL 108 107 107 108  CO2 26 24 26 25   GLUCOSE 162* 88 88 113*  BUN 59* 54* 53* 43*  CREATININE 1.78* 1.61* 1.57* 1.59*  CALCIUM 8.9 8.8 8.8 8.7  MG 2.1  --   --   --   PHOS 3.9 3.2  --   --    Liver Function Tests:  Recent Labs  01/30/14 2028 01/31/14 0330 01/31/14 0410  AST 42*  --  31  ALT 59*  --  54*  ALKPHOS 449*  --  438*  BILITOT 0.3  --  0.3  PROT 6.4  --  6.4  ALBUMIN 1.7*  1.7* 1.7* 1.6*   No results found for this basename: LIPASE, AMYLASE,  in the last 72 hours CBC:  Recent Labs  01/30/14 1430 01/31/14 0410 02/01/14 0400  WBC 7.2 7.4 8.3  NEUTROABS 5.4  --   --   HGB 7.5* 7.4* 7.9*  HCT 24.0* 23.4* 25.2*  MCV 79.7 76.5* 77.8*  PLT 334 376 392   Cardiac Enzymes:  Recent Labs  01/30/14 2028 01/31/14 0108 01/31/14 0630  TROPONINI <0.30 <0.30 <0.30   Coag  Panel:   Lab Results  Component Value Date   INR 2.64* 02/01/2014   INR 5.00* 01/31/2014   INR 4.68* 01/30/2014    RADIOLOGY: Ct Abdomen Pelvis Wo Contrast  01/30/2014   CLINICAL DATA:  Cardiac arrest.  EXAM: CT CHEST, ABDOMEN AND PELVIS WITHOUT CONTRAST  TECHNIQUE: Multidetector CT imaging of the chest, abdomen and pelvis was performed following the standard protocol without IV contrast.  COMPARISON:  None.  CT CHEST W/O CM dated 01/07/2014; DG CHEST 1 VIEW dated 01/30/2014  FINDINGS: CT CHEST FINDINGS  The endotracheal tube extends into the right mainstem bronchus. This was immediately called to Dr. Dina Rich on 01/30/2014 at 3 p.m. Nasogastric tube extends into the abdomen. There appears to be prominent mediastinal lymph nodes which are similar to the previous examination but poorly characterized due to the lack of contrast and motion. There are small bilateral pleural effusions, left side greater than right. No significant pericardial fluid. There is diffuse subcutaneous edema. There is compressive  atelectasis in lower lobes bilaterally. Patchy densities in left upper lung. Airspace densities within the right middle lobe. There are subtle densities in the right upper lung. No evidence for a large pneumothorax. No acute bone abnormality.  CT ABDOMEN AND PELVIS FINDINGS  There is no evidence for free air. Nasogastric tube extends into the distal stomach. There is diffuse subcutaneous edema. There is a small amount of abdominal ascites. Unenhanced CT was performed per clinician order. Lack of IV contrast limits sensitivity and specificity, especially for evaluation of abdominal/pelvic solid viscera. Gallbladder may be mildly distended but poorly characterized. No gross abnormality to the liver or spleen. Limited evaluation of the pancreas and adrenal glands. No gross abnormality to the kidneys. There are prominent small bowel loops in the left abdomen with mesenteric edema. Bowel wall thickening, particularly on  sequence 2, image 86. Fluid in the urinary bladder. No acute bone abnormality.  IMPRESSION: The study is very limited due to the lack of contrast and motion.  Prominent small bowel loops in the left abdomen with mesenteric edema. There is concern for bowel wall thickening in this area. Based on the history of cardiac arrest, ischemic bowel is in the differential diagnosis. Findings could also be related to an infectious enteritis.  Bilateral small pleural effusions with patchy parenchymal lung densities. Findings could represent atelectasis and asymmetric edema. Infection cannot excluded, particularly in the right middle lobe.  Endotracheal tube in the right mainstem bronchus as described.  Small amount of abdominal ascites and diffuse subcutaneous edema. Findings are concerning for anasarca.  Mild distention of the gallbladder.   Electronically Signed   By: Markus Daft M.D.   On: 01/30/2014 15:18   Dg Chest 1 View  01/30/2014   CLINICAL DATA:  Status post cardiac arrest ; assess endotracheal tube and nasogastric tube position  EXAM: CHEST - 1 VIEW  COMPARISON:  CT CHEST W/O CM dated 01/30/2014  FINDINGS: The trachea has undergone interval intubation. The tip of the ET tube lies in the proximal right mainstem bronchus. The lung volumes are reasonably well maintained. There are coarse interstitial markings bilaterally with near confluence at the right lung base.  The esophagogastric tube tip and proximal port project below the inferior margin of the film. The cardiac silhouette is mildly enlarged. The pulmonary vascularity is not engorged. External pacemaker pads are present.  IMPRESSION: 1. The endotracheal tube tip is at the margin of the right main stem bronchus and withdrawal by 5 cm is recommended. 2. The esophagogastric tube appears to be in appropriate position. 3. The pulmonary interstitial markings are mildly increased bilaterally especially on the right. Confluent density at the right lung base suggests  atelectasis or pneumonia. 4. These results were called by telephone at the time of interpretation on 01/30/2014 at 2:55 PM to Doylene Canard, RN,, who verbally acknowledged these results.   Electronically Signed   By: David  Martinique   On: 01/30/2014 14:56   Dg Chest 2 View  01/26/2014   CLINICAL DATA:  Shortness of breath, lower extremity edema  EXAM: CHEST  2 VIEW  COMPARISON:  01/20/2014  FINDINGS: Cardiomegaly again noted. Bilateral small pleural effusion with bilateral basilar atelectasis or infiltrate. No pulmonary edema. Mild degenerative changes thoracic spine.  IMPRESSION: Cardiomegaly. Bilateral small pleural effusion with bilateral basilar atelectasis or infiltrate.   Electronically Signed   By: Lahoma Crocker M.D.   On: 01/26/2014 11:35   Dg Chest 2 View  01/20/2014   CLINICAL DATA:  Followup right lung  volume loss. Shortness of breath.  EXAM: CHEST  2 VIEW  COMPARISON:  01/08/2014 and 01/07/2014  FINDINGS: Interval removal of nasogastric tube, right subclavian central venous catheter and endotracheal tubes. Lungs are hypoinflated with continued evidence of a small left pleural effusion likely with associated atelectasis in the left base. There is slightly better aeration in the left base compared to the previous exam. There is slight worsening of a small right pleural effusion likely with associated atelectasis. Stable cardiomegaly. Remainder the exam is unchanged.  IMPRESSION: Mild worsening of a small right pleural effusion likely with associated right basilar atelectasis. Persistent small left pleural effusion without significant change. Persistent opacification in the left base with slightly better aeration which may be due to atelectasis or infection.   Electronically Signed   By: Marin Olp M.D.   On: 01/20/2014 12:18   Dg Chest 2 View  01/05/2014   CLINICAL DATA:  Reflux and chest congestion after eating.  EXAM: CHEST  2 VIEW  COMPARISON:  03/30/2013  FINDINGS: There is volume loss at the  right lung base compared to the previous examination. Difficult to exclude bronchiectasis at the right lung base. There is no evidence for airspace disease within the right lung. Prominent right hilar structures are probably related to vascular crowding but this has clearly progressed since 08/31/2010. There are chronic pleural-based densities at the left lung base which could be related to volume loss and pleural thickening. Upper lungs are clear. Heart size is stable and within normal limits. The trachea is midline.  IMPRESSION: Chronic left basilar densities are suggestive for pleural-based disease. Findings could be related to a loculated effusion or pleural thickening.  Volume loss and probable vascular crowding at the right lung base. Recommend follow-up to ensure improved aeration at the right lung base and follow the right hilar densities. Alternately, consider further evaluation with a chest CT which could also evaluate the left pleural-based disease.   Electronically Signed   By: Markus Daft M.D.   On: 01/05/2014 17:41   Dg Abd 1 View  01/30/2014   CLINICAL DATA:  Abdominal tenderness and loose stools.  EXAM: ABDOMEN - 1 VIEW  COMPARISON:  DG ABD PORTABLE 1V dated 01/07/2014  FINDINGS: The bowel gas pattern is normal. No obstruction or significant ileus is visualized. No abnormal calcifications or soft tissue abnormalities are identified. Mild degenerative changes are present of the lower lumbar spine.  IMPRESSION: Unremarkable abdominal radiograph.   Electronically Signed   By: Aletta Edouard M.D.   On: 01/30/2014 14:24   Ct Head Wo Contrast  01/30/2014   CLINICAL DATA:  Status post cardiac arrest, query intracranial hemorrhage.  EXAM: CT HEAD WITHOUT CONTRAST  TECHNIQUE: Contiguous axial images were obtained from the base of the skull through the vertex without intravenous contrast.  COMPARISON:  CT HEAD W/O CM dated 01/07/2014  FINDINGS: The brainstem, cerebellum, cerebral peduncles, thalamus, basal  ganglia, basilar cisterns, and ventricular system appear within normal limits. Periventricular white matter and corona radiata hypodensities favor chronic ischemic microvascular white matter disease. No intracranial hemorrhage, mass lesion, or acute CVA. Mucosal thickening in the nasal cavity noted. The mild chronic maxillary, ethmoid, and left sphenoid sinusitis.  IMPRESSION: 1. No intracranial hemorrhage, findings of acute CVA, or other acute intracranial abnormality. 2. Chronic paranasal sinusitis with mucosal swelling and frothy fluid in the nasal cavity.   Electronically Signed   By: Sherryl Barters M.D.   On: 01/30/2014 16:04   Ct Head Wo Contrast  01/07/2014  CLINICAL DATA:  Seizure with altered mental status.  EXAM: CT HEAD WITHOUT CONTRAST  TECHNIQUE: Contiguous axial images were obtained from the base of the skull through the vertex without contrast.  COMPARISON:  None  FINDINGS: Mildly premature cerebral and cerebellar atrophy. Hypoattenuation white matter, likely chronic microvascular ischemic change. No acute stroke or hemorrhage. No visible mass lesion or hydrocephalus. No extra-axial fluid. Calvarium intact. Minimal layering fluid in the sphenoid. No acute middle ear or mastoid fluid. Negative orbits.  IMPRESSION: Premature atrophy. Suspected chronic microvascular ischemic change without large vessel infarct. No acute intracranial findings.   Electronically Signed   By: Rolla Flatten M.D.   On: 01/07/2014 20:35   Ct Chest Wo Contrast  01/30/2014   CLINICAL DATA:  Cardiac arrest.  EXAM: CT CHEST, ABDOMEN AND PELVIS WITHOUT CONTRAST  TECHNIQUE: Multidetector CT imaging of the chest, abdomen and pelvis was performed following the standard protocol without IV contrast.  COMPARISON:  None.  CT CHEST W/O CM dated 01/07/2014; DG CHEST 1 VIEW dated 01/30/2014  FINDINGS: CT CHEST FINDINGS  The endotracheal tube extends into the right mainstem bronchus. This was immediately called to Dr. Dina Rich on 01/30/2014  at 3 p.m. Nasogastric tube extends into the abdomen. There appears to be prominent mediastinal lymph nodes which are similar to the previous examination but poorly characterized due to the lack of contrast and motion. There are small bilateral pleural effusions, left side greater than right. No significant pericardial fluid. There is diffuse subcutaneous edema. There is compressive atelectasis in lower lobes bilaterally. Patchy densities in left upper lung. Airspace densities within the right middle lobe. There are subtle densities in the right upper lung. No evidence for a large pneumothorax. No acute bone abnormality.  CT ABDOMEN AND PELVIS FINDINGS  There is no evidence for free air. Nasogastric tube extends into the distal stomach. There is diffuse subcutaneous edema. There is a small amount of abdominal ascites. Unenhanced CT was performed per clinician order. Lack of IV contrast limits sensitivity and specificity, especially for evaluation of abdominal/pelvic solid viscera. Gallbladder may be mildly distended but poorly characterized. No gross abnormality to the liver or spleen. Limited evaluation of the pancreas and adrenal glands. No gross abnormality to the kidneys. There are prominent small bowel loops in the left abdomen with mesenteric edema. Bowel wall thickening, particularly on sequence 2, image 86. Fluid in the urinary bladder. No acute bone abnormality.  IMPRESSION: The study is very limited due to the lack of contrast and motion.  Prominent small bowel loops in the left abdomen with mesenteric edema. There is concern for bowel wall thickening in this area. Based on the history of cardiac arrest, ischemic bowel is in the differential diagnosis. Findings could also be related to an infectious enteritis.  Bilateral small pleural effusions with patchy parenchymal lung densities. Findings could represent atelectasis and asymmetric edema. Infection cannot excluded, particularly in the right middle lobe.   Endotracheal tube in the right mainstem bronchus as described.  Small amount of abdominal ascites and diffuse subcutaneous edema. Findings are concerning for anasarca.  Mild distention of the gallbladder.   Electronically Signed   By: Markus Daft M.D.   On: 01/30/2014 15:18   Ct Chest Wo Contrast  01/07/2014   CLINICAL DATA:  Respiratory distress requiring intubation  EXAM: CT CHEST WITHOUT CONTRAST  TECHNIQUE: Multidetector CT imaging of the chest was performed following the standard protocol without IV contrast.  COMPARISON:  Chest x-ray earlier today  FINDINGS: Mediastinum: The patient  is intubated. The ET tube terminates well above the carina. Hyperdense thyroid gland is nonspecific. Evaluation of the mediastinum for lymphadenopathy is limited given respiratory motion and absence of intravenous contrast material. There appear to be some a high left paratracheal borderline enlarged lymph nodes measuring up to 1 cm in short axis. Prevascular nodal tissue also measures 1 cm in short axis. Low right paratracheal node measures 1 cm in short axis. Incompletely imaged nasogastric tube traverses the thoracic esophagus.  Heart/Vascular: Limited evaluation in the absence of IV contrast. Cardiomegaly with left and right heart enlargement. No pericardial effusion. No definite aortic aneurysmal dilatation. Well-positioned right subclavian central venous catheter. Catheter tip is at the superior cavoatrial junction.  Lungs/Pleura: Moderate left larger than right pleural effusions with associated bibasilar atelectasis. The aerated portions of the upper lungs demonstrate no significant focal airspace consolidation or suspicious pulmonary nodule. Mild interlobular septal thickening consistent with a mild interstitial edema. Respiratory motion limits evaluation for small pulmonary nodules. Secretions are noted within the trachea and there is a cut off of the right lower lobe bronchus.  Bones/Soft Tissues: No acute fracture or  aggressive appearing lytic or blastic osseous lesion.  Upper Abdomen:  Unremarkable limited imaging of the upper abdomen.  IMPRESSION: 1. Moderate left larger than right bilateral pleural effusions with associated bibasilar atelectasis. 2. Mild interstitial pulmonary edema. In the setting of cardiomegaly, this is favored to reflect CHF. 3. Frothy secretions noted in the trachea distal to the endotracheal tube and within the right lower lobe bronchus resulting in a bronchial cut off sign. Strictly speaking, an endobronchial lesion cannot be completely excluded radiographically. 4. Nonspecific mediastinal adenopathy may be reactive, or related to chronic CHF. 5. Satisfactory position of support apparatus as above.   Electronically Signed   By: Jacqulynn Cadet M.D.   On: 01/07/2014 20:49   Nm Hepatobiliary  01/12/2014   CLINICAL DATA:  Upper abdominal pain  EXAM: NUCLEAR MEDICINE HEPATOBILIARY IMAGING  Views:  Anterior right upper quadrant  Radionuclide:  Technetium 15m Choletec  Dose:  5.0 mCi  Route of administration: Intravenous  COMPARISON:  None.  FINDINGS: Liver uptake of radiotracer is normal. There is prompt visualization of gallbladder and small bowel, indicating patency of the cystic and common bile ducts.  IMPRESSION: Study within normal limits.   Electronically Signed   By: Lowella Grip M.D.   On: 01/12/2014 13:14   US Renal  01/14/2014   CLINICAL DATA:  Acute kidney injury.  Renal insufficiency.  EXAM: RENAL/URINARY TRACT ULTRASOUND COMPLETE  COMPARISON:  None.  FINDINGS: Right Kidney:  Length: 11.6 cm. Diffusely increased parenchymal echogenicity seen, consistent with medical renal disease. No mass or hydronephrosis visualized.  Left Kidney:  Length: 11.9 cm. Diffusely increased parenchymal echogenicity seen, consistent with medical renal disease. No mass or hydronephrosis visualized. Left pleural effusion also noted.  Bladder:  Empty with Foley catheter in place.  IMPRESSION: Normal renal  size with increased parenchymal echogenicity, consistent with medical renal disease. No evidence of hydronephrosis.  Left pleural effusion incidentally noted.   Electronically Signed   By: Earle Gell M.D.   On: 01/14/2014 21:09   Mr Foot Left W Wo Contrast  01/09/2014   CLINICAL DATA:  Nonhealing wound on the plantar surface of the foot the first MTP joint in the diabetic patient.  EXAM: MRI OF THE LEFT FOREFOOT WITHOUT AND WITH CONTRAST  TECHNIQUE: Multiplanar, multisequence MR imaging was performed both before and after administration of intravenous contrast.  CONTRAST:  15  mL MULTIHANCE GADOBENATE DIMEGLUMINE 529 MG/ML IV SOLN  COMPARISON:  Plain films of the left foot 01/05/2014.  FINDINGS: There is a skin ulceration on the plantar surface the foot at the level the first MTP joint. Extensive subcutaneous edema and enhancement are present about the foot consistent with cellulitis. No bone marrow signal abnormality to suggest osteomyelitis is seen. The medial sesamoid bone demonstrates decreased signal on both T1 and T2 weighted images without postcontrast enhancement likely due to chronic osteonecrosis. All visualized intrinsic musculature of the foot demonstrates increased T2 signal. There is postcontrast enhancement in the flexor hallucis brevis muscle. The patient has a small first MTP joint effusion.  IMPRESSION: Skin ulceration on the plantar surface of the foot at the first MTP joint without underlying abscess or osteomyelitis. Changes of diffuse cellulitis are identified. Edema and enhancement in the flexor hallucis brevis muscle are compatible with myositis.  Small first MTP joint effusion is likely aseptic and incidental.  Appearance of the medial sesamoid bone of the first MTP joint most consistent with chronic osteonecrosis.   Electronically Signed   By: Inge Rise M.D.   On: 01/09/2014 13:06   Nm Pulmonary Perf And Vent  01/09/2014   CLINICAL DATA:  Evaluate for pulmonary embolism  EXAM:  NUCLEAR MEDICINE VENTILATION - PERFUSION LUNG SCAN  TECHNIQUE: Ventilation images were obtained in multiple projections using inhaled aerosol technetium 99 M DTPA. Perfusion images were obtained in multiple projections after intravenous injection of Tc-55m MAA.  COMPARISON:  DG CHEST 1V PORT dated 01/08/2014  RADIOPHARMACEUTICALS:  40 mCi Tc-36m DTPA aerosol and 6 mCi Tc-36m MAA  FINDINGS: Review of chest radiograph performed and prior demonstrates enlarged cardiac silhouette and mediastinal contours. Endotracheal tube overlies the tracheal air column with tip superior to the carina. There are bibasilar heterogeneous opacities, left greater than right. Small left-sided pleural effusion. Mild pulmonary venous congestion without definite evidence of edema.  Ventilation: There is clumping of inhaled radiotracer about the bilateral pulmonary hila. There is otherwise adequate ventilation of the bilateral pulmonary parenchyma. There is ingested radiotracer seen within the hypopharynx, stomach and proximal small bowel.  Perfusion: There is a linear area of relative oligemia about the right major fissure secondary to known partially layering pleural effusion. There is otherwise relative homogeneous distribution of injected radiotracer throughout the pulmonary parenchyma. There are no discrete mismatched segmental or subsegmental filling defects to suggest pulmonary embolism.  IMPRESSION: Pulmonary embolism absent (very low probability for pulmonary embolism).   Electronically Signed   By: Sandi Mariscal M.D.   On: 01/09/2014 11:44   US Abdomen Port  01/31/2014   CLINICAL DATA:  Elevated liver function tests.  EXAM: ULTRASOUND PORTABLE ABDOMEN  COMPARISON:  CT of the abdomen and pelvis 01/30/2014. Abdominal ultrasound 01/08/2014.  FINDINGS: Gallbladder:  No gallstones. Gallbladder is only partially distended. Gallbladder wall thickness is slightly increased at 4.4 mm (likely a reflection of under distention). No  pericholecystic fluid. Per report from the sonographer, the patient did not exhibit a sonographic Murphy's sign on examination.  Common bile duct:  Diameter: Normal in caliber measuring 5.6 mm in the porta hepatis.  Liver:  No focal lesion identified. Within normal limits in parenchymal echogenicity. No intrahepatic biliary ductal dilatation.  IVC:  No abnormality visualized.  Pancreas:  Visualized portion unremarkable.  Spleen:  Size and appearance within normal limits. Measures 5.5 cm in length.  Right Kidney:  Length: 12.5 cm. Echogenicity within normal limits. No mass or hydronephrosis visualized.  Left Kidney:  Length:  12.4 cm. Echogenicity within normal limits. No mass or hydronephrosis visualized.  Abdominal aorta:  Largely obscured by overlying bowel gas. Measures up to 2.5 cm in diameter proximally.  Other findings:  Small volume of ascites.  Bilateral pleural effusions.  IMPRESSION: 1. No definite acute findings to account for the patient's symptoms. 2. Although the gallbladder wall appears mildly thickened at 4.4 mm, this is nonspecific and likely related to under distention of the gallbladder on today's examination, as well as a generalized state of anasarca given the presence of ascites and pleural effusions. No definite findings to suggest acute cholecystitis are noted at this time. Additionally, there is no intra or extrahepatic biliary ductal dilatation.   Electronically Signed   By: Vinnie Langton M.D.   On: 01/31/2014 07:18   [place] Art/ven Flow Abd Pelv Doppler  01/08/2014   CLINICAL DATA:  Hypotension and worsening liver function tests.  EXAM: DUPLEX ULTRASOUND OF LIVER  TECHNIQUE: Color and duplex Doppler ultrasound was performed to evaluate the hepatic in-flow and out-flow vessels.  COMPARISON:  DG ABD PORTABLE 1V dated 01/07/2014; CT CHEST W/O CM dated 01/07/2014; US ABDOMEN LIMITED RUQ/ASCITES dated 01/08/2014  FINDINGS: Portal Vein Velocities  Main:  28-30 cm/sec  Right:  21 cm/sec  Left:  19  cm/sec  Hepatic Vein Velocities  Right:  48 cm/sec  Middle:  44 cm/sec  Left:  40 cm/sec  Hepatic Artery Velocity:  60 cm/sec  Splenic Vein Velocity:  21 cm/sec  Varices: None identified.  Ascites: None identified.  The portal and main hepatic veins are widely patent without evidence of thrombus. Flow direction and velocities are normal. The intrahepatic IVC is widely patent. The spleen is normal in size.  IMPRESSION: Unremarkable duplex ultrasound of the liver demonstrating no evidence of portal vein thrombosis or hepatic veno-occlusive disease.   Electronically Signed   By: Aletta Edouard M.D.   On: 01/08/2014 11:57   Dg Chest Port 1 View  01/30/2014   CLINICAL DATA:  Central line placement.  EXAM: PORTABLE CHEST - 1 VIEW  COMPARISON:  01/30/2014 1431 hr  FINDINGS: Right internal jugular central venous line has its tip in the lower superior vena cava. There is no pneumothorax.  Endotracheal tube has been retracted. Tip now lies 3.7 cm above the chronic, well positioned.  Nasogastric tube is stable passing well into the stomach. Lung base opacity is also unchanged.  IMPRESSION: 1. Right internal jugular central venous line tip in the lower superior vena cava. No pneumothorax. 2. Endotracheal tube well positioned.   Electronically Signed   By: Lajean Manes M.D.   On: 01/30/2014 21:01   Dg Chest Port 1 View  01/08/2014   CLINICAL DATA:  Respiratory failure.  EXAM: PORTABLE CHEST - 1 VIEW  COMPARISON:  CT CHEST W/O CM dated 01/07/2014; DG CHEST 1V PORT dated 01/07/2014  FINDINGS: Endotracheal tube remains with the tip approximately 3.5 cm above the carina. The lungs show improved aeration at both bases with residual atelectasis remaining, left greater than right. There is a small left pleural effusion. No overt pulmonary edema is identified. The heart remains mildly enlarged. No pneumothorax is present. Central line positioning is stable.  IMPRESSION: Improved aeration of both lower lobes, residual atelectasis  remains, left greater than right with associated left pleural effusion.   Electronically Signed   By: Aletta Edouard M.D.   On: 01/08/2014 08:48   Dg Chest Port 1 View  01/07/2014   CLINICAL DATA:  ETT and central venous  catheter.  EXAM: PORTABLE CHEST - 1 VIEW  COMPARISON:  01/07/2014 and 01/05/2014  FINDINGS: Enteric tube courses through the region of the stomach and off the inferior portion of the film as tip is not visualized. Endotracheal tube is present with tip 3.4 cm above the carina. A right subclavian central venous catheter is present with tip over the SVC below the level of the carina. Lungs are hypoinflated. There is no right-sided pneumothorax. There is mild worsening perihilar bibasilar opacification as findings likely due to small effusions with bibasilar atelectasis and possible mild vascular congestion. Cannot exclude infection. Mild stable cardiomegaly. Remainder of the exam is unchanged.  IMPRESSION: Hypoinflation with mild perihilar bibasilar opacification likely small effusions with bibasilar atelectasis. There is likely a component of vascular congestion. Cannot exclude infection in the lung bases.  Tubes and lines as described.  No evidence of pneumothorax.   Electronically Signed   By: Marin Olp M.D.   On: 01/07/2014 18:48   Dg Chest Port 1 View  01/07/2014   CLINICAL DATA:  Shortness of breath.  EXAM: PORTABLE CHEST - 1 VIEW  COMPARISON:  01/05/2014.  FINDINGS: The cardiac silhouette, mediastinal and hilar contours are Stable. Mild cardiac enlargement. There are bilateral effusions, left greater than right with overlying atelectasis. Low lung volumes with vascular crowding. Mild central vascular congestion but no overt pulmonary edema. The bony thorax is intact.  IMPRESSION: Persistent effusions and atelectasis.  Low lung volumes with vascular crowding.   Electronically Signed   By: Kalman Jewels M.D.   On: 01/07/2014 16:40   Dg Abd Portable 1v  01/07/2014   CLINICAL DATA:   Severe right-side abdominal pain  EXAM: PORTABLE ABDOMEN - 1 VIEW  COMPARISON:  Portable exam 1622 hr without priors for comparison  FINDINGS: Scattered bowel loops.  No bowel dilatation or bowel wall thickening.  Small left pleural effusion.  Slightly increased attenuation of the abdomen and nonvisualization of hepatic and splenic margins could reflect ascites.  Bones unremarkable.  No definite urinary tract calcification.  IMPRESSION: Nonobstructive bowel gas pattern.  Small left pleural effusion.  Cannot exclude ascites.   Electronically Signed   By: Lavonia Dana M.D.   On: 01/07/2014 16:32   Dg Foot Complete Left  01/05/2014   CLINICAL DATA:  Left-sided foot pain.  EXAM: LEFT FOOT - COMPLETE 3+ VIEW  COMPARISON:  No priors.  FINDINGS: Three views of the left foot demonstrate diffuse soft tissue swelling of the distal aspect of the great toe. Along the plantar surface of the foot there are several locular some gas in the swollen soft tissues beneath the distal first metatarsal, concerning for an ulcer and soft tissue infection. No definite osteolysis in the underlying bone to strongly suggest the presence of osteomyelitis at this time. No acute displaced fracture, subluxation or dislocation.  IMPRESSION: 1. Probable soft tissue ulceration/infection with gas in the plantar aspect of the foot beneath the great toe (adjacent to the distal first metatarsal). No definite signs to strongly suggest frank osteomyelitis at this time, but clinical correlation is recommended.   Electronically Signed   By: Vinnie Langton M.D.   On: 01/05/2014 17:30   US Abdomen Limited Ruq  01/08/2014   CLINICAL DATA:  Abdominal pain  EXAM: US ABDOMEN LIMITED - RIGHT UPPER QUADRANT  COMPARISON:  [place] ART/VEN ABD/PELV/SCROTUM DOP dated 01/08/2014; CT CHEST W/O CM dated 01/07/2014  FINDINGS: Gallbladder:  There is marked thickening of the gallbladder wall measuring approximately 1.1 cm in diameter (image  8). This finding is associated with  potential very minimal amount of associated pericholecystic fluid. No definitive radiopaque gallstones or gallbladder sludge. Sonographic Percell Miller sign is unable to be performed secondary to patient is unresponsive state.  Common bile duct:  Diameter: Normal in size measuring 5.8 mm in diameter  Liver:  Homogeneous hepatic echotexture. No discrete hepatic lesions. No definite evidence of intrahepatic biliary ductal dilatation. No ascites.  IMPRESSION: Findings are worrisome for acute acalculous cholecystitis. Further evaluation could be performed with HIDA scan as clinically indicated.  These results will be called to the ordering clinician or representative by the Radiologist Assistant, and communication documented in the PACS Dashboard.   Electronically Signed   By: Sandi Mariscal M.D.   On: 01/08/2014 12:07   ASSESSMENT:  1. Cardiac arrest - asystole then to PEA. He was on beta blockers and verapamil at home. Need to consider acute PE in the setting of recent DVT and PEA. 2D echo shows normal LVF and EKG shows only nonspecific T wave abnormality. VQ scan in Feburary was negative for PE. Cardiac enzymes are normal.  2. Acute on chronic renal failure - stable 3. Recent DVT  4. Diabetic foot ulcer, L with wound infection  5. HTN - poorly controlled  6. Seizure disorder on last hospitalization  7. Acute respiratory failure in the setting of cardiac arrest  8. Abdominal distention with diarrhea   PLAN:  1. Check d-dimer and consider VQ scan although he has been anticoagulated  2. Discussed with Dr. Burt Knack - no other noncardiac etiology for Cardiac arrest so needs to have coronary anatomy defined at some point. Currently hemodynamically stable. Will reverse coagulopathy with vitamin K.  He received 5mg  Vit K yesterday and INR 2.4.  Will repeat Vit K dose today.  Will start IV Heparin gtt once INR<2  3. Hold beta blockers and Verapamil for now  4. Continue hydralazine 25mg  TID and increase amlodipine 10 mg  daily for BP control since stopping verapamil and coreg  5. Vitamin K 5mg  SQ now  6. NPO after MN  7. Cath in am if INR ok 8.  Cardiac catheterization was discussed with the patient fully including risks on myocardial infarction, death, stroke, bleeding, arrhythmia, dye allergy, renal insufficiency or bleeding.  All patient questions and concerns were discussed and the patient understands and is willing to proceed.          Sueanne Margarita, MD  02/01/2014  7:47 AM

## 2014-02-01 NOTE — Consult Note (Signed)
WOC wound consult note Reason for Consult: Consult requested for left foot wounds.  Pt had I&D performed on previous admission; 2/23 by Dr Doran Durand of the ortho service.  He has not been back to the surgeon for his post-op appointment since he became ill.  Wound type: Full thickness post-op incision to anterior foot 4X1X1.5cm.  Beefy red with visible tendons, no odor, minimal amt yellow drainage. Full thickness post-op incision to plantar foot 4X1X1cm.  Beefy red with visible tendons, no odor, minimal amt yellow drainage. Periwound:Intact skin surrounding. Dressing procedure/placement/frequency: Continue present plan of care with moist gauze packing Q day.  Pt should follow-up with Dr Doran Durand after discharge or primary team may wish to notify him that pt is here in the hospital and he can asess wounds and provide further plan of care. Please re-consult if further assistance is needed.  Thank-you,  Julien Girt MSN, [place], [place], [place], [place]

## 2014-02-02 ENCOUNTER — Inpatient Hospital Stay (HOSPITAL_COMMUNITY): Payer: Self-pay

## 2014-02-02 ENCOUNTER — Encounter (HOSPITAL_COMMUNITY)
Admission: EM | Disposition: A | Discharge status: Skilled Nursing Facility | Payer: Self-pay | Source: Home / Self Care | Attending: Internal Medicine

## 2014-02-02 ENCOUNTER — Encounter: Payer: PRIVATE HEALTH INSURANCE | Admitting: Internal Medicine

## 2014-02-02 ENCOUNTER — Inpatient Hospital Stay (HOSPITAL_COMMUNITY): Admit: 2014-02-02 | Payer: PRIVATE HEALTH INSURANCE

## 2014-02-02 DIAGNOSIS — I2510 Atherosclerotic heart disease of native coronary artery without angina pectoris: Secondary | ICD-10-CM

## 2014-02-02 HISTORY — PX: LEFT HEART CATHETERIZATION WITH CORONARY ANGIOGRAM: SHX5451

## 2014-02-02 LAB — BASIC METABOLIC PANEL
BUN: 44 mg/dL — ABNORMAL HIGH (ref 6–23)
CO2: 26 meq/L (ref 19–32)
CREATININE: 1.68 mg/dL — AB (ref 0.50–1.35)
Calcium: 8.5 mg/dL (ref 8.4–10.5)
Chloride: 103 mEq/L (ref 96–112)
GFR calc Af Amer: 49 mL/min — ABNORMAL LOW (ref >90)
GFR calc non Af Amer: 42 mL/min — ABNORMAL LOW (ref >90)
GLUCOSE: 160 mg/dL — AB (ref 70–99)
Potassium: 4.2 mEq/L (ref 3.7–5.3)
Sodium: 140 mEq/L (ref 137–147)

## 2014-02-02 LAB — CBC
HCT: 26.7 % — ABNORMAL LOW (ref 39.0–52.0)
HCT: 26.1 % — ABNORMAL LOW (ref 39.0–52.0)
HEMOGLOBIN: 8.3 g/dL — AB (ref 13.0–17.0)
Hemoglobin: 8.2 g/dL — ABNORMAL LOW (ref 13.0–17.0)
MCH: 24.3 pg — ABNORMAL LOW (ref 26.0–34.0)
MCH: 24.6 pg — AB (ref 26.0–34.0)
MCHC: 31.1 g/dL (ref 30.0–36.0)
MCHC: 31.4 g/dL (ref 30.0–36.0)
MCV: 78.3 fL (ref 78.0–100.0)
MCV: 78.4 fL (ref 78.0–100.0)
PLATELETS: 394 10*3/uL (ref 150–400)
Platelets: 436 10*3/uL — ABNORMAL HIGH (ref 150–400)
RBC: 3.41 MIL/uL — ABNORMAL LOW (ref 4.22–5.81)
RBC: 3.33 MIL/uL — ABNORMAL LOW (ref 4.22–5.81)
RDW: 21.8 % — AB (ref 11.5–15.5)
RDW: 21.6 % — ABNORMAL HIGH (ref 11.5–15.5)
WBC: 9.9 10*3/uL (ref 4.0–10.5)
WBC: 9 10*3/uL (ref 4.0–10.5)

## 2014-02-02 LAB — GLUCOSE, CAPILLARY
GLUCOSE-CAPILLARY: 172 mg/dL — AB (ref 70–99)
GLUCOSE-CAPILLARY: 134 mg/dL — AB (ref 70–99)
Glucose-Capillary: 163 mg/dL — ABNORMAL HIGH (ref 70–99)
Glucose-Capillary: 150 mg/dL — ABNORMAL HIGH (ref 70–99)

## 2014-02-02 LAB — PROTIME-INR
INR: 1.64 — ABNORMAL HIGH (ref 0.00–1.49)
Prothrombin Time: 19 seconds — ABNORMAL HIGH (ref 11.6–15.2)

## 2014-02-02 LAB — CREATININE, SERUM
CREATININE: 1.54 mg/dL — AB (ref 0.50–1.35)
GFR calc Af Amer: 54 mL/min — ABNORMAL LOW (ref >90)
GFR calc non Af Amer: 47 mL/min — ABNORMAL LOW (ref >90)

## 2014-02-02 LAB — APTT: aPTT: 42 seconds — ABNORMAL HIGH (ref 24–37)

## 2014-02-02 SURGERY — LEFT HEART CATHETERIZATION WITH CORONARY ANGIOGRAM
Anesthesia: LOCAL

## 2014-02-02 MED ORDER — HEPARIN (PORCINE) IN NACL 2-0.9 UNIT/ML-% IJ SOLN
INTRAMUSCULAR | Status: AC
  Filled 2014-02-02: qty 1000

## 2014-02-02 MED ORDER — HEPARIN (PORCINE) IN NACL 100-0.45 UNIT/ML-% IJ SOLN
1100.0000 [IU]/h | INTRAMUSCULAR | Status: DC
  Administered 2014-02-02: 1100 [IU]/h via INTRAVENOUS
  Filled 2014-02-02: qty 250

## 2014-02-02 MED ORDER — FUROSEMIDE 10 MG/ML IJ SOLN
60.0000 mg | Freq: Once | INTRAMUSCULAR | Status: AC
  Administered 2014-02-02: 60 mg via INTRAVENOUS
  Filled 2014-02-02: qty 6

## 2014-02-02 MED ORDER — LIDOCAINE HCL (PF) 1 % IJ SOLN
INTRAMUSCULAR | Status: AC
  Filled 2014-02-02: qty 30

## 2014-02-02 MED ORDER — HEPARIN SODIUM (PORCINE) 1000 UNIT/ML IJ SOLN
INTRAMUSCULAR | Status: AC
  Filled 2014-02-02: qty 1

## 2014-02-02 MED ORDER — VERAPAMIL HCL 2.5 MG/ML IV SOLN
INTRAVENOUS | Status: AC
  Filled 2014-02-02: qty 2

## 2014-02-02 MED ORDER — HEPARIN SODIUM (PORCINE) 5000 UNIT/ML IJ SOLN
5000.0000 [IU] | Freq: Three times a day (TID) | INTRAMUSCULAR | Status: DC
  Administered 2014-02-02 – 2014-02-03 (×2): 5000 [IU] via SUBCUTANEOUS
  Filled 2014-02-02 (×5): qty 1

## 2014-02-02 MED ORDER — SODIUM CHLORIDE 0.9 % IV SOLN
INTRAVENOUS | Status: AC
  Administered 2014-02-02: 12:00:00 via INTRAVENOUS

## 2014-02-02 NOTE — H&P (View-Only) (Signed)
SUBJECTIVE:  No complaints this am  OBJECTIVE:   Vitals:   Filed Vitals:   02/02/14 0300 02/02/14 0500 02/02/14 0600 02/02/14 0729  BP: 190/105 154/72 183/87   Pulse: 85 83 91   Temp:  97.8 F (36.6 C)  97.5 F (36.4 C)  TempSrc:  Oral  Oral  Resp: 15 0 0   Height:      Weight:  170 lb 6.7 oz (77.3 kg)    SpO2: 96% 91% 93%    I&O's:   Intake/Output Summary (Last 24 hours) at 02/02/14 0806 Last data filed at 02/02/14 0700  Gross per 24 hour  Intake 1943.85 ml  Output    990 ml  Net 953.85 ml   TELEMETRY: Reviewed telemetry pt in NSR:     PHYSICAL EXAM General: Well developed, well nourished, in no acute distress Head: Eyes PERRLA, No xanthomas.   Normal cephalic and atramatic  Lungs:   Clear bilaterally to auscultation and percussion. Heart:   HRRR S1 S2 Pulses are 2+ & equal. Abdomen: Bowel sounds are positive, abdomen soft and non-tender without masses Extremities:   No clubbing, cyanosis or edema.  DP +1 Neuro: Alert and oriented X 3. Psych:  Good affect, responds appropriately   LABS: Basic Metabolic Panel:  Recent Labs  01/30/14 2028 01/31/14 0330  02/01/14 0400 02/02/14 0440  NA 143 145  < > 145 140  K 4.1 3.9  < > 4.2 4.2  CL 108 107  < > 108 103  CO2 26 24  < > 25 26  GLUCOSE 162* 88  < > 113* 160*  BUN 59* 54*  < > 43* 44*  CREATININE 1.78* 1.61*  < > 1.59* 1.68*  CALCIUM 8.9 8.8  < > 8.7 8.5  MG 2.1  --   --   --   --   PHOS 3.9 3.2  --   --   --   < > = values in this interval not displayed. Liver Function Tests:  Recent Labs  01/30/14 2028 01/31/14 0330 01/31/14 0410  AST 42*  --  31  ALT 59*  --  54*  ALKPHOS 449*  --  438*  BILITOT 0.3  --  0.3  PROT 6.4  --  6.4  ALBUMIN 1.7*  1.7* 1.7* 1.6*   No results found for this basename: LIPASE, AMYLASE,  in the last 72 hours CBC:  Recent Labs  01/30/14 1430  02/01/14 0400 02/02/14 0440  WBC 7.2  < > 8.3 9.9  NEUTROABS 5.4  --   --   --   HGB 7.5*  < > 7.9* 8.3*  HCT 24.0*   < > 25.2* 26.7*  MCV 79.7  < > 77.8* 78.3  PLT 334  < > 392 436*  < > = values in this interval not displayed. Cardiac Enzymes:  Recent Labs  01/30/14 2028 01/31/14 0108 01/31/14 0630  TROPONINI <0.30 <0.30 <0.30   BNP: No components found with this basename: POCBNP,  D-Dimer:  Recent Labs  02/01/14 0400  DDIMER 1.76*   Hemoglobin A1C: No results found for this basename: HGBA1C,  in the last 72 hours Fasting Lipid Panel: No results found for this basename: CHOL, HDL, LDLCALC, TRIG, CHOLHDL, LDLDIRECT,  in the last 72 hours Thyroid Function Tests: No results found for this basename: TSH, T4TOTAL, FREET3, T3FREE, THYROIDAB,  in the last 72 hours Anemia Panel: No results found for this basename: VITAMINB12, FOLATE, FERRITIN, TIBC, IRON, RETICCTPCT,  in the  last 72 hours Coag Panel:   Lab Results  Component Value Date   INR 1.64* 02/02/2014   INR 2.12* 02/01/2014   INR 2.64* 02/01/2014    RADIOLOGY: Ct Abdomen Pelvis Wo Contrast  01/30/2014   CLINICAL DATA:  Cardiac arrest.  EXAM: CT CHEST, ABDOMEN AND PELVIS WITHOUT CONTRAST  TECHNIQUE: Multidetector CT imaging of the chest, abdomen and pelvis was performed following the standard protocol without IV contrast.  COMPARISON:  None.  CT CHEST W/O CM dated 01/07/2014; DG CHEST 1 VIEW dated 01/30/2014  FINDINGS: CT CHEST FINDINGS  The endotracheal tube extends into the right mainstem bronchus. This was immediately called to Dr. Dina Rich on 01/30/2014 at 3 p.m. Nasogastric tube extends into the abdomen. There appears to be prominent mediastinal lymph nodes which are similar to the previous examination but poorly characterized due to the lack of contrast and motion. There are small bilateral pleural effusions, left side greater than right. No significant pericardial fluid. There is diffuse subcutaneous edema. There is compressive atelectasis in lower lobes bilaterally. Patchy densities in left upper lung. Airspace densities within the right middle  lobe. There are subtle densities in the right upper lung. No evidence for a large pneumothorax. No acute bone abnormality.  CT ABDOMEN AND PELVIS FINDINGS  There is no evidence for free air. Nasogastric tube extends into the distal stomach. There is diffuse subcutaneous edema. There is a small amount of abdominal ascites. Unenhanced CT was performed per clinician order. Lack of IV contrast limits sensitivity and specificity, especially for evaluation of abdominal/pelvic solid viscera. Gallbladder may be mildly distended but poorly characterized. No gross abnormality to the liver or spleen. Limited evaluation of the pancreas and adrenal glands. No gross abnormality to the kidneys. There are prominent small bowel loops in the left abdomen with mesenteric edema. Bowel wall thickening, particularly on sequence 2, image 86. Fluid in the urinary bladder. No acute bone abnormality.  IMPRESSION: The study is very limited due to the lack of contrast and motion.  Prominent small bowel loops in the left abdomen with mesenteric edema. There is concern for bowel wall thickening in this area. Based on the history of cardiac arrest, ischemic bowel is in the differential diagnosis. Findings could also be related to an infectious enteritis.  Bilateral small pleural effusions with patchy parenchymal lung densities. Findings could represent atelectasis and asymmetric edema. Infection cannot excluded, particularly in the right middle lobe.  Endotracheal tube in the right mainstem bronchus as described.  Small amount of abdominal ascites and diffuse subcutaneous edema. Findings are concerning for anasarca.  Mild distention of the gallbladder.   Electronically Signed   By: Markus Daft M.D.   On: 01/30/2014 15:18   Dg Chest 1 View  01/30/2014   CLINICAL DATA:  Status post cardiac arrest ; assess endotracheal tube and nasogastric tube position  EXAM: CHEST - 1 VIEW  COMPARISON:  CT CHEST W/O CM dated 01/30/2014  FINDINGS: The trachea has  undergone interval intubation. The tip of the ET tube lies in the proximal right mainstem bronchus. The lung volumes are reasonably well maintained. There are coarse interstitial markings bilaterally with near confluence at the right lung base.  The esophagogastric tube tip and proximal port project below the inferior margin of the film. The cardiac silhouette is mildly enlarged. The pulmonary vascularity is not engorged. External pacemaker pads are present.  IMPRESSION: 1. The endotracheal tube tip is at the margin of the right main stem bronchus and withdrawal by 5 cm  is recommended. 2. The esophagogastric tube appears to be in appropriate position. 3. The pulmonary interstitial markings are mildly increased bilaterally especially on the right. Confluent density at the right lung base suggests atelectasis or pneumonia. 4. These results were called by telephone at the time of interpretation on 01/30/2014 at 2:55 PM to Doylene Canard, RN,, who verbally acknowledged these results.   Electronically Signed   By: David  Martinique   On: 01/30/2014 14:56   Dg Chest 2 View  01/26/2014   CLINICAL DATA:  Shortness of breath, lower extremity edema  EXAM: CHEST  2 VIEW  COMPARISON:  01/20/2014  FINDINGS: Cardiomegaly again noted. Bilateral small pleural effusion with bilateral basilar atelectasis or infiltrate. No pulmonary edema. Mild degenerative changes thoracic spine.  IMPRESSION: Cardiomegaly. Bilateral small pleural effusion with bilateral basilar atelectasis or infiltrate.   Electronically Signed   By: Lahoma Crocker M.D.   On: 01/26/2014 11:35   Dg Chest 2 View  01/20/2014   CLINICAL DATA:  Followup right lung volume loss. Shortness of breath.  EXAM: CHEST  2 VIEW  COMPARISON:  01/08/2014 and 01/07/2014  FINDINGS: Interval removal of nasogastric tube, right subclavian central venous catheter and endotracheal tubes. Lungs are hypoinflated with continued evidence of a small left pleural effusion likely with associated  atelectasis in the left base. There is slightly better aeration in the left base compared to the previous exam. There is slight worsening of a small right pleural effusion likely with associated atelectasis. Stable cardiomegaly. Remainder the exam is unchanged.  IMPRESSION: Mild worsening of a small right pleural effusion likely with associated right basilar atelectasis. Persistent small left pleural effusion without significant change. Persistent opacification in the left base with slightly better aeration which may be due to atelectasis or infection.   Electronically Signed   By: Marin Olp M.D.   On: 01/20/2014 12:18   Dg Chest 2 View  01/05/2014   CLINICAL DATA:  Reflux and chest congestion after eating.  EXAM: CHEST  2 VIEW  COMPARISON:  03/30/2013  FINDINGS: There is volume loss at the right lung base compared to the previous examination. Difficult to exclude bronchiectasis at the right lung base. There is no evidence for airspace disease within the right lung. Prominent right hilar structures are probably related to vascular crowding but this has clearly progressed since 08/31/2010. There are chronic pleural-based densities at the left lung base which could be related to volume loss and pleural thickening. Upper lungs are clear. Heart size is stable and within normal limits. The trachea is midline.  IMPRESSION: Chronic left basilar densities are suggestive for pleural-based disease. Findings could be related to a loculated effusion or pleural thickening.  Volume loss and probable vascular crowding at the right lung base. Recommend follow-up to ensure improved aeration at the right lung base and follow the right hilar densities. Alternately, consider further evaluation with a chest CT which could also evaluate the left pleural-based disease.   Electronically Signed   By: Markus Daft M.D.   On: 01/05/2014 17:41   Dg Abd 1 View  01/30/2014   CLINICAL DATA:  Abdominal tenderness and loose stools.  EXAM:  ABDOMEN - 1 VIEW  COMPARISON:  DG ABD PORTABLE 1V dated 01/07/2014  FINDINGS: The bowel gas pattern is normal. No obstruction or significant ileus is visualized. No abnormal calcifications or soft tissue abnormalities are identified. Mild degenerative changes are present of the lower lumbar spine.  IMPRESSION: Unremarkable abdominal radiograph.   Electronically Signed  By: Aletta Edouard M.D.   On: 01/30/2014 14:24   Ct Head Wo Contrast  01/30/2014   CLINICAL DATA:  Status post cardiac arrest, query intracranial hemorrhage.  EXAM: CT HEAD WITHOUT CONTRAST  TECHNIQUE: Contiguous axial images were obtained from the base of the skull through the vertex without intravenous contrast.  COMPARISON:  CT HEAD W/O CM dated 01/07/2014  FINDINGS: The brainstem, cerebellum, cerebral peduncles, thalamus, basal ganglia, basilar cisterns, and ventricular system appear within normal limits. Periventricular white matter and corona radiata hypodensities favor chronic ischemic microvascular white matter disease. No intracranial hemorrhage, mass lesion, or acute CVA. Mucosal thickening in the nasal cavity noted. The mild chronic maxillary, ethmoid, and left sphenoid sinusitis.  IMPRESSION: 1. No intracranial hemorrhage, findings of acute CVA, or other acute intracranial abnormality. 2. Chronic paranasal sinusitis with mucosal swelling and frothy fluid in the nasal cavity.   Electronically Signed   By: Sherryl Barters M.D.   On: 01/30/2014 16:04   Ct Head Wo Contrast  01/07/2014   CLINICAL DATA:  Seizure with altered mental status.  EXAM: CT HEAD WITHOUT CONTRAST  TECHNIQUE: Contiguous axial images were obtained from the base of the skull through the vertex without contrast.  COMPARISON:  None  FINDINGS: Mildly premature cerebral and cerebellar atrophy. Hypoattenuation white matter, likely chronic microvascular ischemic change. No acute stroke or hemorrhage. No visible mass lesion or hydrocephalus. No extra-axial fluid. Calvarium  intact. Minimal layering fluid in the sphenoid. No acute middle ear or mastoid fluid. Negative orbits.  IMPRESSION: Premature atrophy. Suspected chronic microvascular ischemic change without large vessel infarct. No acute intracranial findings.   Electronically Signed   By: Rolla Flatten M.D.   On: 01/07/2014 20:35   Ct Chest Wo Contrast  01/30/2014   CLINICAL DATA:  Cardiac arrest.  EXAM: CT CHEST, ABDOMEN AND PELVIS WITHOUT CONTRAST  TECHNIQUE: Multidetector CT imaging of the chest, abdomen and pelvis was performed following the standard protocol without IV contrast.  COMPARISON:  None.  CT CHEST W/O CM dated 01/07/2014; DG CHEST 1 VIEW dated 01/30/2014  FINDINGS: CT CHEST FINDINGS  The endotracheal tube extends into the right mainstem bronchus. This was immediately called to Dr. Dina Rich on 01/30/2014 at 3 p.m. Nasogastric tube extends into the abdomen. There appears to be prominent mediastinal lymph nodes which are similar to the previous examination but poorly characterized due to the lack of contrast and motion. There are small bilateral pleural effusions, left side greater than right. No significant pericardial fluid. There is diffuse subcutaneous edema. There is compressive atelectasis in lower lobes bilaterally. Patchy densities in left upper lung. Airspace densities within the right middle lobe. There are subtle densities in the right upper lung. No evidence for a large pneumothorax. No acute bone abnormality.  CT ABDOMEN AND PELVIS FINDINGS  There is no evidence for free air. Nasogastric tube extends into the distal stomach. There is diffuse subcutaneous edema. There is a small amount of abdominal ascites. Unenhanced CT was performed per clinician order. Lack of IV contrast limits sensitivity and specificity, especially for evaluation of abdominal/pelvic solid viscera. Gallbladder may be mildly distended but poorly characterized. No gross abnormality to the liver or spleen. Limited evaluation of the pancreas  and adrenal glands. No gross abnormality to the kidneys. There are prominent small bowel loops in the left abdomen with mesenteric edema. Bowel wall thickening, particularly on sequence 2, image 86. Fluid in the urinary bladder. No acute bone abnormality.  IMPRESSION: The study is very limited due to  the lack of contrast and motion.  Prominent small bowel loops in the left abdomen with mesenteric edema. There is concern for bowel wall thickening in this area. Based on the history of cardiac arrest, ischemic bowel is in the differential diagnosis. Findings could also be related to an infectious enteritis.  Bilateral small pleural effusions with patchy parenchymal lung densities. Findings could represent atelectasis and asymmetric edema. Infection cannot excluded, particularly in the right middle lobe.  Endotracheal tube in the right mainstem bronchus as described.  Small amount of abdominal ascites and diffuse subcutaneous edema. Findings are concerning for anasarca.  Mild distention of the gallbladder.   Electronically Signed   By: Markus Daft M.D.   On: 01/30/2014 15:18   Ct Chest Wo Contrast  01/07/2014   CLINICAL DATA:  Respiratory distress requiring intubation  EXAM: CT CHEST WITHOUT CONTRAST  TECHNIQUE: Multidetector CT imaging of the chest was performed following the standard protocol without IV contrast.  COMPARISON:  Chest x-ray earlier today  FINDINGS: Mediastinum: The patient is intubated. The ET tube terminates well above the carina. Hyperdense thyroid gland is nonspecific. Evaluation of the mediastinum for lymphadenopathy is limited given respiratory motion and absence of intravenous contrast material. There appear to be some a high left paratracheal borderline enlarged lymph nodes measuring up to 1 cm in short axis. Prevascular nodal tissue also measures 1 cm in short axis. Low right paratracheal node measures 1 cm in short axis. Incompletely imaged nasogastric tube traverses the thoracic esophagus.   Heart/Vascular: Limited evaluation in the absence of IV contrast. Cardiomegaly with left and right heart enlargement. No pericardial effusion. No definite aortic aneurysmal dilatation. Well-positioned right subclavian central venous catheter. Catheter tip is at the superior cavoatrial junction.  Lungs/Pleura: Moderate left larger than right pleural effusions with associated bibasilar atelectasis. The aerated portions of the upper lungs demonstrate no significant focal airspace consolidation or suspicious pulmonary nodule. Mild interlobular septal thickening consistent with a mild interstitial edema. Respiratory motion limits evaluation for small pulmonary nodules. Secretions are noted within the trachea and there is a cut off of the right lower lobe bronchus.  Bones/Soft Tissues: No acute fracture or aggressive appearing lytic or blastic osseous lesion.  Upper Abdomen:  Unremarkable limited imaging of the upper abdomen.  IMPRESSION: 1. Moderate left larger than right bilateral pleural effusions with associated bibasilar atelectasis. 2. Mild interstitial pulmonary edema. In the setting of cardiomegaly, this is favored to reflect CHF. 3. Frothy secretions noted in the trachea distal to the endotracheal tube and within the right lower lobe bronchus resulting in a bronchial cut off sign. Strictly speaking, an endobronchial lesion cannot be completely excluded radiographically. 4. Nonspecific mediastinal adenopathy may be reactive, or related to chronic CHF. 5. Satisfactory position of support apparatus as above.   Electronically Signed   By: Jacqulynn Cadet M.D.   On: 01/07/2014 20:49   Nm Hepatobiliary  01/12/2014   CLINICAL DATA:  Upper abdominal pain  EXAM: NUCLEAR MEDICINE HEPATOBILIARY IMAGING  Views:  Anterior right upper quadrant  Radionuclide:  Technetium 84m Choletec  Dose:  5.0 mCi  Route of administration: Intravenous  COMPARISON:  None.  FINDINGS: Liver uptake of radiotracer is normal. There is prompt  visualization of gallbladder and small bowel, indicating patency of the cystic and common bile ducts.  IMPRESSION: Study within normal limits.   Electronically Signed   By: Lowella Grip M.D.   On: 01/12/2014 13:14   US Renal  01/14/2014   CLINICAL DATA:  Acute kidney  injury.  Renal insufficiency.  EXAM: RENAL/URINARY TRACT ULTRASOUND COMPLETE  COMPARISON:  None.  FINDINGS: Right Kidney:  Length: 11.6 cm. Diffusely increased parenchymal echogenicity seen, consistent with medical renal disease. No mass or hydronephrosis visualized.  Left Kidney:  Length: 11.9 cm. Diffusely increased parenchymal echogenicity seen, consistent with medical renal disease. No mass or hydronephrosis visualized. Left pleural effusion also noted.  Bladder:  Empty with Foley catheter in place.  IMPRESSION: Normal renal size with increased parenchymal echogenicity, consistent with medical renal disease. No evidence of hydronephrosis.  Left pleural effusion incidentally noted.   Electronically Signed   By: Earle Gell M.D.   On: 01/14/2014 21:09   Mr Foot Left W Wo Contrast  01/09/2014   CLINICAL DATA:  Nonhealing wound on the plantar surface of the foot the first MTP joint in the diabetic patient.  EXAM: MRI OF THE LEFT FOREFOOT WITHOUT AND WITH CONTRAST  TECHNIQUE: Multiplanar, multisequence MR imaging was performed both before and after administration of intravenous contrast.  CONTRAST:  15 mL MULTIHANCE GADOBENATE DIMEGLUMINE 529 MG/ML IV SOLN  COMPARISON:  Plain films of the left foot 01/05/2014.  FINDINGS: There is a skin ulceration on the plantar surface the foot at the level the first MTP joint. Extensive subcutaneous edema and enhancement are present about the foot consistent with cellulitis. No bone marrow signal abnormality to suggest osteomyelitis is seen. The medial sesamoid bone demonstrates decreased signal on both T1 and T2 weighted images without postcontrast enhancement likely due to chronic osteonecrosis. All  visualized intrinsic musculature of the foot demonstrates increased T2 signal. There is postcontrast enhancement in the flexor hallucis brevis muscle. The patient has a small first MTP joint effusion.  IMPRESSION: Skin ulceration on the plantar surface of the foot at the first MTP joint without underlying abscess or osteomyelitis. Changes of diffuse cellulitis are identified. Edema and enhancement in the flexor hallucis brevis muscle are compatible with myositis.  Small first MTP joint effusion is likely aseptic and incidental.  Appearance of the medial sesamoid bone of the first MTP joint most consistent with chronic osteonecrosis.   Electronically Signed   By: Inge Rise M.D.   On: 01/09/2014 13:06   Nm Pulmonary Perf And Vent  02/01/2014   CLINICAL DATA Shortness of breath, evaluate for pulmonary embolism  EXAM NUCLEAR MEDICINE VENTILATION - PERFUSION LUNG SCAN  TECHNIQUE Ventilation images were obtained in multiple projections using inhaled aerosol technetium 99 M DTPA. Perfusion images were obtained in multiple projections after intravenous injection of Tc-26m MAA.  RADIOPHARMACEUTICALS 40 mCi Tc-11m DTPA aerosol and 6 mCi Tc-23m MAA  COMPARISON NM PULMONARY VENT & PERF dated 01/09/2014; DG CHEST 1V PORT dated 01/30/2014; DG CHEST 1 VIEW dated 01/30/2014; DG CHEST 2 VIEW dated 01/26/2014  FINDINGS Review of chest radiograph performed 01/30/2014 demonstrates an enlarged cardiac silhouette and mediastinal contours. Stable position of support apparatus. No pneumothorax. External pacing pads overlie left ventricular apex. The pulmonary vasculature is indistinct with cephalization of flow. Grossly unchanged perihilar and bilateral medial basilar opacities. Unchanged small bilateral effusions. No definite pneumothorax.  Ventilation: Review the ventilatory images demonstrates clumping of inhaled radiotracer about the bilateral pulmonary hila, left greater than right. Inhaled radiotracer is seen layering within the  left mainstem bronchus. There is overall poor ventilation of the bilateral lungs. A minimal amount of radiotracer is seen within the hypopharynx.  Perfusion: There is relative homogeneous distribution of injected radiotracer throughout the bilateral pulmonary parenchyma. There is minimal oligemia about the right major fissure and  blunting of the left costophrenic angle, both of which are unchanged and likely secondary to the patient's known small bilateral pleural effusions. No discrete mismatched segmental or subsegmental filling defects.  IMPRESSION Pulmonary embolism absent (very low probability for pulmonary embolism).  SIGNATURE  Electronically Signed   By: Sandi Mariscal M.D.   On: 02/01/2014 16:09   Nm Pulmonary Perf And Vent  01/09/2014   CLINICAL DATA:  Evaluate for pulmonary embolism  EXAM: NUCLEAR MEDICINE VENTILATION - PERFUSION LUNG SCAN  TECHNIQUE: Ventilation images were obtained in multiple projections using inhaled aerosol technetium 99 M DTPA. Perfusion images were obtained in multiple projections after intravenous injection of Tc-33m MAA.  COMPARISON:  DG CHEST 1V PORT dated 01/08/2014  RADIOPHARMACEUTICALS:  40 mCi Tc-38m DTPA aerosol and 6 mCi Tc-60m MAA  FINDINGS: Review of chest radiograph performed and prior demonstrates enlarged cardiac silhouette and mediastinal contours. Endotracheal tube overlies the tracheal air column with tip superior to the carina. There are bibasilar heterogeneous opacities, left greater than right. Small left-sided pleural effusion. Mild pulmonary venous congestion without definite evidence of edema.  Ventilation: There is clumping of inhaled radiotracer about the bilateral pulmonary hila. There is otherwise adequate ventilation of the bilateral pulmonary parenchyma. There is ingested radiotracer seen within the hypopharynx, stomach and proximal small bowel.  Perfusion: There is a linear area of relative oligemia about the right major fissure secondary to known  partially layering pleural effusion. There is otherwise relative homogeneous distribution of injected radiotracer throughout the pulmonary parenchyma. There are no discrete mismatched segmental or subsegmental filling defects to suggest pulmonary embolism.  IMPRESSION: Pulmonary embolism absent (very low probability for pulmonary embolism).   Electronically Signed   By: Sandi Mariscal M.D.   On: 01/09/2014 11:44   US Abdomen Port  01/31/2014   CLINICAL DATA:  Elevated liver function tests.  EXAM: ULTRASOUND PORTABLE ABDOMEN  COMPARISON:  CT of the abdomen and pelvis 01/30/2014. Abdominal ultrasound 01/08/2014.  FINDINGS: Gallbladder:  No gallstones. Gallbladder is only partially distended. Gallbladder wall thickness is slightly increased at 4.4 mm (likely a reflection of under distention). No pericholecystic fluid. Per report from the sonographer, the patient did not exhibit a sonographic Murphy's sign on examination.  Common bile duct:  Diameter: Normal in caliber measuring 5.6 mm in the porta hepatis.  Liver:  No focal lesion identified. Within normal limits in parenchymal echogenicity. No intrahepatic biliary ductal dilatation.  IVC:  No abnormality visualized.  Pancreas:  Visualized portion unremarkable.  Spleen:  Size and appearance within normal limits. Measures 5.5 cm in length.  Right Kidney:  Length: 12.5 cm. Echogenicity within normal limits. No mass or hydronephrosis visualized.  Left Kidney:  Length: 12.4 cm. Echogenicity within normal limits. No mass or hydronephrosis visualized.  Abdominal aorta:  Largely obscured by overlying bowel gas. Measures up to 2.5 cm in diameter proximally.  Other findings:  Small volume of ascites.  Bilateral pleural effusions.  IMPRESSION: 1. No definite acute findings to account for the patient's symptoms. 2. Although the gallbladder wall appears mildly thickened at 4.4 mm, this is nonspecific and likely related to under distention of the gallbladder on today's examination,  as well as a generalized state of anasarca given the presence of ascites and pleural effusions. No definite findings to suggest acute cholecystitis are noted at this time. Additionally, there is no intra or extrahepatic biliary ductal dilatation.   Electronically Signed   By: Vinnie Langton M.D.   On: 01/31/2014 07:18   [place] Art/ven Flow  Abd Pelv Doppler  01/08/2014   CLINICAL DATA:  Hypotension and worsening liver function tests.  EXAM: DUPLEX ULTRASOUND OF LIVER  TECHNIQUE: Color and duplex Doppler ultrasound was performed to evaluate the hepatic in-flow and out-flow vessels.  COMPARISON:  DG ABD PORTABLE 1V dated 01/07/2014; CT CHEST W/O CM dated 01/07/2014; US ABDOMEN LIMITED RUQ/ASCITES dated 01/08/2014  FINDINGS: Portal Vein Velocities  Main:  28-30 cm/sec  Right:  21 cm/sec  Left:  19 cm/sec  Hepatic Vein Velocities  Right:  48 cm/sec  Middle:  44 cm/sec  Left:  40 cm/sec  Hepatic Artery Velocity:  60 cm/sec  Splenic Vein Velocity:  21 cm/sec  Varices: None identified.  Ascites: None identified.  The portal and main hepatic veins are widely patent without evidence of thrombus. Flow direction and velocities are normal. The intrahepatic IVC is widely patent. The spleen is normal in size.  IMPRESSION: Unremarkable duplex ultrasound of the liver demonstrating no evidence of portal vein thrombosis or hepatic veno-occlusive disease.   Electronically Signed   By: Aletta Edouard M.D.   On: 01/08/2014 11:57   Dg Chest Port 1 View  01/30/2014   CLINICAL DATA:  Central line placement.  EXAM: PORTABLE CHEST - 1 VIEW  COMPARISON:  01/30/2014 1431 hr  FINDINGS: Right internal jugular central venous line has its tip in the lower superior vena cava. There is no pneumothorax.  Endotracheal tube has been retracted. Tip now lies 3.7 cm above the chronic, well positioned.  Nasogastric tube is stable passing well into the stomach. Lung base opacity is also unchanged.  IMPRESSION: 1. Right internal jugular central venous line  tip in the lower superior vena cava. No pneumothorax. 2. Endotracheal tube well positioned.   Electronically Signed   By: Lajean Manes M.D.   On: 01/30/2014 21:01   Dg Chest Port 1 View  01/08/2014   CLINICAL DATA:  Respiratory failure.  EXAM: PORTABLE CHEST - 1 VIEW  COMPARISON:  CT CHEST W/O CM dated 01/07/2014; DG CHEST 1V PORT dated 01/07/2014  FINDINGS: Endotracheal tube remains with the tip approximately 3.5 cm above the carina. The lungs show improved aeration at both bases with residual atelectasis remaining, left greater than right. There is a small left pleural effusion. No overt pulmonary edema is identified. The heart remains mildly enlarged. No pneumothorax is present. Central line positioning is stable.  IMPRESSION: Improved aeration of both lower lobes, residual atelectasis remains, left greater than right with associated left pleural effusion.   Electronically Signed   By: Aletta Edouard M.D.   On: 01/08/2014 08:48   Dg Chest Port 1 View  01/07/2014   CLINICAL DATA:  ETT and central venous catheter.  EXAM: PORTABLE CHEST - 1 VIEW  COMPARISON:  01/07/2014 and 01/05/2014  FINDINGS: Enteric tube courses through the region of the stomach and off the inferior portion of the film as tip is not visualized. Endotracheal tube is present with tip 3.4 cm above the carina. A right subclavian central venous catheter is present with tip over the SVC below the level of the carina. Lungs are hypoinflated. There is no right-sided pneumothorax. There is mild worsening perihilar bibasilar opacification as findings likely due to small effusions with bibasilar atelectasis and possible mild vascular congestion. Cannot exclude infection. Mild stable cardiomegaly. Remainder of the exam is unchanged.  IMPRESSION: Hypoinflation with mild perihilar bibasilar opacification likely small effusions with bibasilar atelectasis. There is likely a component of vascular congestion. Cannot exclude infection in the lung bases.  Tubes and lines as described.  No evidence of pneumothorax.   Electronically Signed   By: Marin Olp M.D.   On: 01/07/2014 18:48   Dg Chest Port 1 View  01/07/2014   CLINICAL DATA:  Shortness of breath.  EXAM: PORTABLE CHEST - 1 VIEW  COMPARISON:  01/05/2014.  FINDINGS: The cardiac silhouette, mediastinal and hilar contours are Stable. Mild cardiac enlargement. There are bilateral effusions, left greater than right with overlying atelectasis. Low lung volumes with vascular crowding. Mild central vascular congestion but no overt pulmonary edema. The bony thorax is intact.  IMPRESSION: Persistent effusions and atelectasis.  Low lung volumes with vascular crowding.   Electronically Signed   By: Kalman Jewels M.D.   On: 01/07/2014 16:40   Dg Abd Portable 1v  01/07/2014   CLINICAL DATA:  Severe right-side abdominal pain  EXAM: PORTABLE ABDOMEN - 1 VIEW  COMPARISON:  Portable exam 1622 hr without priors for comparison  FINDINGS: Scattered bowel loops.  No bowel dilatation or bowel wall thickening.  Small left pleural effusion.  Slightly increased attenuation of the abdomen and nonvisualization of hepatic and splenic margins could reflect ascites.  Bones unremarkable.  No definite urinary tract calcification.  IMPRESSION: Nonobstructive bowel gas pattern.  Small left pleural effusion.  Cannot exclude ascites.   Electronically Signed   By: Lavonia Dana M.D.   On: 01/07/2014 16:32   Dg Foot Complete Left  01/05/2014   CLINICAL DATA:  Left-sided foot pain.  EXAM: LEFT FOOT - COMPLETE 3+ VIEW  COMPARISON:  No priors.  FINDINGS: Three views of the left foot demonstrate diffuse soft tissue swelling of the distal aspect of the great toe. Along the plantar surface of the foot there are several locular some gas in the swollen soft tissues beneath the distal first metatarsal, concerning for an ulcer and soft tissue infection. No definite osteolysis in the underlying bone to strongly suggest the presence of osteomyelitis  at this time. No acute displaced fracture, subluxation or dislocation.  IMPRESSION: 1. Probable soft tissue ulceration/infection with gas in the plantar aspect of the foot beneath the great toe (adjacent to the distal first metatarsal). No definite signs to strongly suggest frank osteomyelitis at this time, but clinical correlation is recommended.   Electronically Signed   By: Vinnie Langton M.D.   On: 01/05/2014 17:30   US Abdomen Limited Ruq  01/08/2014   CLINICAL DATA:  Abdominal pain  EXAM: US ABDOMEN LIMITED - RIGHT UPPER QUADRANT  COMPARISON:  [place] ART/VEN ABD/PELV/SCROTUM DOP dated 01/08/2014; CT CHEST W/O CM dated 01/07/2014  FINDINGS: Gallbladder:  There is marked thickening of the gallbladder wall measuring approximately 1.1 cm in diameter (image 8). This finding is associated with potential very minimal amount of associated pericholecystic fluid. No definitive radiopaque gallstones or gallbladder sludge. Sonographic Percell Miller sign is unable to be performed secondary to patient is unresponsive state.  Common bile duct:  Diameter: Normal in size measuring 5.8 mm in diameter  Liver:  Homogeneous hepatic echotexture. No discrete hepatic lesions. No definite evidence of intrahepatic biliary ductal dilatation. No ascites.  IMPRESSION: Findings are worrisome for acute acalculous cholecystitis. Further evaluation could be performed with HIDA scan as clinically indicated.  These results will be called to the ordering clinician or representative by the Radiologist Assistant, and communication documented in the PACS Dashboard.   Electronically Signed   By: Sandi Mariscal M.D.   On: 01/08/2014 12:07   ASSESSMENT:  1. Cardiac arrest - asystole then to PEA.  He was on beta blockers and verapamil at home. Acute PE ruled out by VQ scan this admit.  2D echo shows normal LVF and EKG shows only nonspecific T wave abnormality. Cardiac enzymes are normal.  2. Acute on chronic renal failure - stable  3. Recent DVT  4. Diabetic  foot ulcer, L with wound infection  5. HTN - poorly controlled  6. Seizure disorder on last hospitalization  7. Acute respiratory failure in the setting of cardiac arrest  8. Abdominal distention with diarrhea   PLAN:  1. Beta blockers and Verapamil on hold for now  2. Continue hydralazine and amlodipine for BP control since stopping verapamil and coreg  3. Increase Hydralazine to 50mg  QID for better Bp control 4. NPO for cath - coronaries only due to underlying renal disease 5. Cardiac catheterization was discussed with the patient fully including risks on myocardial infarction, death, stroke, bleeding, arrhythmia, dye allergy, renal insufficiency or bleeding. All patient questions and concerns were discussed and the patient understands and is willing to proceed.        Sueanne Margarita, MD  02/02/2014  8:06 AM

## 2014-02-02 NOTE — Progress Notes (Signed)
02/02/2014 1015 To cath lab. Lailana Shira, Carolynn Comment

## 2014-02-02 NOTE — Consult Note (Signed)
ELECTROPHYSIOLOGY CONSULT NOTE  Patient ID: DEJAY SOOKDEO MRN: [id number], DOB/AGE: Dec 15, 1950   Admit date: 01/30/2014 Date of Consult: 02/02/2014  Primary Physician: Duwaine Maxin, DO Primary Cardiologist: seen by CHF team previously, Bensimhon, MD Reason for Consultation: Cardiac arrest  History of Present Illness Joshua [name] is a 63 y.o. male with HTN, DM, previous treatment for extrapulmonary TB (2013), diabetic neuropathy, HTN, diastolic HF, CKD, DVT bilateral peroneal veins (Feb 2015) and chronic L plantar foot ulcer recently admitted with sepsis / septic shock. He was discharged to [facility] after prolonged hospital stay Feb 2015.   While at [facility] he was being treated for C.diff colitis. On 01/30/2014 he was sent to [facility] for outpatient KUB for evaluation of abdominal pain, distention and diarrhea. According to ED records from [facility], he became unresponsive at 1421 while in the Radiology department and was without a pulse. CPR was initiated. Per nursing documentation, once Zoll in place, he was found to have PEA and was given atropine, bicarb and calcium chloride. CPR continued and he was intubated. At 1425 patient had palpable femoral pulse with ROSC. He was then transported to [facility] ED. There he was in SR with evidence of coronary ischemia by ECG. Troponin negative. Serum Cr 1.9. Potassium 4.7, magnesium 2.1. He was transported to [facility]. Initially was placed on hypothermia protocol but this was aborted on arrival to [facility] as he was neurologically intact. He was also extubated. Head CT showed no acute abnormality. Abdominal CT showed bowel edema, some ascites. Abdominal US without definite evidence of cholecystitis. Blood cultures with no growth so far. Echo showed normal LVEF but with RV dilatation and mildly reduced RV function. V/Q scan showed no significant mismatch. In addition, his INR was supratherapeutic so acute PE was felt to be less  likely etiology of arrest. Cardiac cath showed mild, nonob CAD.   Of note, he was taking carvedilol and verapamil which have been held since admission. Joshua [name] denies any history of syncope. He has occasional dizziness that has been ongoing "for years." This occurs fairly infrequently. He reports intermittent SOB while here and has had oxygen desaturation to the low 80s when off [place] O2. He denies CP. He has not had fever or chills.     Past Medical History Past Medical History  Diagnosis Date  . 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 Hypertension 10/14/2012  . Type 2 diabetes mellitus   . Diabetic nephropathy with proteinuria   . 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 Diastolic heart failure   . Cellulitis   . Convulsions   . Iron deficiency anemia   . Venous thrombosis and embolism   . Anasarca   . Hypoalbuminemia   . Elevated alkaline phosphatase level   . Microcytic anemia   . GI bleed   . Nephrotic syndrome     Past Surgical History Past Surgical History  Procedure Laterality Date  . Knee arthroscopy  2010    3 times, right knee  . I&d extremity Left 01/17/2014    Procedure: IRRIGATION AND DEBRIDEMENT  LEFT FOOT;  Surgeon: Wylene Simmer, MD;  Location: [facility];  Service: Orthopedics;  Laterality: Left;    Allergies/Intolerances Allergies  Allergen Reactions  . Aspirin Itching    Current Outpatient Medications      carvedilol 12.5 MG tablet  Commonly known as:  COREG  Take 1 tablet (12.5 mg total) by mouth 2 (two) times daily with a  meal.     cephALEXin 500 MG capsule  Commonly known as:  KEFLEX  Take 1 capsule (500 mg total) by mouth every 8 (eight) hours.     ferrous sulfate 325 (65 FE) MG tablet  Take 1 tablet (325 mg total) by mouth 3 (three) times daily with meals.     hydrALAZINE 50 MG tablet  Commonly known as:  APRESOLINE  Take 1 tablet (50 mg total) by mouth every 8 (eight) hours.     ipratropium-albuterol 0.5-2.5 (3) MG/3ML Soln   Commonly known as:  DUONEB  Take 3 mLs by nebulization every 6 (six) hours as needed.     isosorbide mononitrate 30 MG 24 hr tablet  Commonly known as:  IMDUR  Take 1 tablet (30 mg total) by mouth daily.     omeprazole 20 MG capsule  Commonly known as:  PRILOSEC  Take 20 mg by mouth daily.     verapamil 120 MG CR tablet  Commonly known as:  CALAN-SR  Take 1.5 tablets (180 mg total) by mouth daily.     warfarin 5 MG tablet  Commonly known as:  COUMADIN  Take 5-7.5 mg by mouth daily. Alternate 5mg  and 7.5mg  every other day     Inpatient Medications . amLODipine  10 mg Oral Daily  . heparin  5,000 Units Subcutaneous 3 times per day  . hydrALAZINE  50 mg Oral 3 times per day  . insulin aspart  0-15 Units Subcutaneous TID WC  . isosorbide mononitrate  30 mg Oral Daily   . sodium chloride 10 mL/hr (01/31/14 0046)  . sodium chloride 50 mL/hr at 02/02/14 1140    Family History Unknown; immigrated from [place] in the [place] History History   Social History  . Marital Status: Single    Spouse Name: N/A    Number of Children: 5  . 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 Smoking status: Former Smoker -- 20 years    Types: Cigarettes    Quit date: 11/25/1992  . Smokeless tobacco: Never Used  . Alcohol Use: No     Comment: Rarely.  . Drug Use: No  . Sexual Activity: Not on file     Comment: 09/2012: > 15 partners in past 3 years, inconsistent condom use   Other Topics Concern  . Not on file   Social History Narrative   Has a masters degree in education     Review of Systems General: No chills, fever, night sweats or weight changes  Cardiovascular:  No chest pain, dyspnea on exertion, edema, orthopnea, palpitations, paroxysmal nocturnal dyspnea Dermatological: No rash, lesions or masses Respiratory: No cough, dyspnea Urologic: No hematuria, dysuria Abdominal: No nausea, vomiting, diarrhea,  bright red blood per rectum, melena, or hematemesis Neurologic: No visual changes, weakness, changes in mental status All other systems reviewed and are otherwise negative except as noted above.  Physical Exam Vitals: Blood pressure 176/96, pulse 86, temperature 98.4 F (36.9 C), temperature source Oral, resp. rate 0, height 5\' 6"  (1.676 m), weight 170 lb 6.7 oz (77.3 kg), SpO2 97.00%.  General: Well developed 63 y.o. male in no acute distress. HEENT: Normocephalic, atraumatic. EOMs intact. Sclera nonicteric. Oropharynx clear.  Neck: Supple. No JVD. Lungs: Respirations regular and unlabored. Decreased breath sounds at right base. Otherwise CTA bilaterally. No wheezes, rales or rhonchi. Heart: RRR. S1, S2 present. No murmurs, rub, S3 or S4. Abdomen:  Soft, non-tender, non-distended. BS present x 4 quadrants. No hepatosplenomegaly.  Extremities: No clubbing, cyanosis or edema. DP/PT/Radials 2+ and equal bilaterally. Neuro: Sleepy. Alert and oriented X 3. Moves all extremities spontaneously. Musculoskeletal: No kyphosis. Skin: Intact. Warm and dry. No rashes or petechiae in exposed areas. Dressings on wound left foot.   Labs  Recent Labs  01/30/14 2028 01/31/14 0108 01/31/14 0630  TROPONINI <0.30 <0.30 <0.30   Lab Results  Component Value Date   WBC 9.0 02/02/2014   HGB 8.2* 02/02/2014   HCT 26.1* 02/02/2014   MCV 78.4 02/02/2014   PLT 394 02/02/2014    Recent Labs Lab 01/31/14 0410  02/02/14 0440 02/02/14 1228  NA 144  < > 140  --   K 3.9  < > 4.2  --   CL 107  < > 103  --   CO2 26  < > 26  --   BUN 53*  < > 44*  --   CREATININE 1.57*  < > 1.68* 1.54*  CALCIUM 8.8  < > 8.5  --   PROT 6.4  --   --   --   BILITOT 0.3  --   --   --   ALKPHOS 438*  --   --   --   ALT 54*  --   --   --   AST 31  --   --   --   GLUCOSE 88  < > 160*  --   < > = values in this interval not displayed.  Lab Results  Component Value Date   DDIMER 1.76* 02/01/2014    Recent Labs   02/02/14 0440  INR 1.64*    Radiology/Studies Ct Abdomen Pelvis Wo Contrast 01/30/2014    CT ABDOMEN AND PELVIS FINDINGS  There is no evidence for free air. Nasogastric tube extends into the distal stomach. There is diffuse subcutaneous edema. There is a small amount of abdominal ascites. Unenhanced CT was performed per clinician order. Lack of IV contrast limits sensitivity and specificity, especially for evaluation of abdominal/pelvic solid viscera. Gallbladder may be mildly distended but poorly characterized. No gross abnormality to the liver or spleen. Limited evaluation of the pancreas and adrenal glands. No gross abnormality to the kidneys. There are prominent small bowel loops in the left abdomen with mesenteric edema. Bowel wall thickening, particularly on sequence 2, image 86. Fluid in the urinary bladder. No acute bone abnormality.  IMPRESSION: The study is very limited due to the lack of contrast and motion.  Prominent small bowel loops in the left abdomen with mesenteric edema. There is concern for bowel wall thickening in this area. Based on the history of cardiac arrest, ischemic bowel is in the differential diagnosis. Findings could also be related to an infectious enteritis. Small amount of abdominal ascites and diffuse subcutaneous edema. Findings are concerning for anasarca.  Mild distention of the gallbladder.    Electronically Signed   By: Markus Daft M.D.   On: 01/30/2014 15:18   Dg Chest 1 View 01/30/2014    FINDINGS: The trachea has undergone interval intubation. The tip of the ET tube lies in the proximal right mainstem bronchus. The lung volumes are reasonably well maintained. There are coarse interstitial markings bilaterally with near confluence at the right lung base.  The esophagogastric tube tip and proximal port project below the inferior margin of the film. The cardiac silhouette is mildly enlarged. The pulmonary vascularity is not engorged. External pacemaker pads are  present.  IMPRESSION: 1. The endotracheal tube tip is at the margin of the right main stem bronchus and withdrawal by 5 cm is recommended. 2. The esophagogastric tube appears to be in appropriate position. 3. The pulmonary interstitial markings are mildly increased bilaterally especially on the right. Confluent density at the right lung base suggests atelectasis or pneumonia. 4. These results were called by telephone at the time of interpretation on 01/30/2014 at 2:55 PM to Doylene Canard, RN,, who verbally acknowledged these results.    Electronically Signed   By: David  Martinique   On: 01/30/2014 14:56   Ct Head Wo Contrast 01/30/2014     FINDINGS: The brainstem, cerebellum, cerebral peduncles, thalamus, basal ganglia, basilar cisterns, and ventricular system appear within normal limits. Periventricular white matter and corona radiata hypodensities favor chronic ischemic microvascular white matter disease. No intracranial hemorrhage, mass lesion, or acute CVA. Mucosal thickening in the nasal cavity noted. The mild chronic maxillary, ethmoid, and left sphenoid sinusitis.   IMPRESSION: 1. No intracranial hemorrhage, findings of acute CVA, or other acute intracranial abnormality. 2. Chronic paranasal sinusitis with mucosal swelling and frothy fluid in the nasal cavity.    Electronically Signed   By: Sherryl Barters M.D.   On: 01/30/2014 16:04   Ct Chest Wo Contrast 01/30/2014     CT CHEST FINDINGS  The endotracheal tube extends into the right mainstem bronchus. This was immediately called to Dr. Dina Rich on 01/30/2014 at 3 p.m. Nasogastric tube extends into the abdomen. There appears to be prominent mediastinal lymph nodes which are similar to the previous examination but poorly characterized due to the lack of contrast and motion. There are small bilateral pleural effusions, left side greater than right. No significant pericardial fluid. There is diffuse subcutaneous edema. There is compressive atelectasis in  lower lobes bilaterally. Patchy densities in left upper lung. Airspace densities within the right middle lobe. There are subtle densities in the right upper lung. No evidence for a large pneumothorax. No acute bone abnormality.   IMPRESSION: The study is very limited due to the lack of contrast and motion. Bilateral small pleural effusions with patchy parenchymal lung densities. Findings could represent atelectasis and asymmetric edema. Infection cannot excluded, particularly in the right middle lobe.  Endotracheal tube in the right mainstem bronchus as described.  Small amount of abdominal ascites and diffuse subcutaneous edema. Findings are concerning for anasarca.  Mild distention of the gallbladder.    Electronically Signed   By: Markus Daft M.D.   On: 01/30/2014 15:18   Nm Pulmonary Perf And Vent 02/01/2014   FINDINGS Review of chest radiograph performed 01/30/2014 demonstrates an enlarged cardiac silhouette and mediastinal contours. Stable position of support apparatus. No pneumothorax. External pacing pads overlie left ventricular apex. The pulmonary vasculature is indistinct with cephalization of flow. Grossly unchanged perihilar and bilateral medial basilar opacities. Unchanged small bilateral effusions. No definite pneumothorax.  Ventilation: Review the ventilatory images demonstrates clumping of inhaled radiotracer about the bilateral pulmonary hila, left greater than right. Inhaled radiotracer is seen layering within the left mainstem bronchus. There is overall poor ventilation of the bilateral lungs. A minimal amount of radiotracer is seen within the hypopharynx.  Perfusion: There is relative homogeneous distribution of injected radiotracer throughout the bilateral pulmonary parenchyma. There is minimal oligemia about the right major fissure and blunting of the left costophrenic angle, both of which are unchanged and likely secondary to the patient's known small bilateral pleural effusions. No  discrete mismatched segmental or subsegmental filling defects.  IMPRESSION Pulmonary embolism absent (very low probability for pulmonary embolism).   Electronically Signed   By: Sandi Mariscal M.D.   On: 02/01/2014 16:09   US Abdomen Port 01/31/2014    FINDINGS: Gallbladder:  No gallstones. Gallbladder is only partially distended. Gallbladder wall thickness is slightly increased at 4.4 mm (likely a reflection of under distention). No pericholecystic fluid. Per report from the sonographer, the patient did not exhibit a sonographic Murphy's sign on examination.  Common bile duct:  Diameter: Normal in caliber measuring 5.6 mm in the porta hepatis.  Liver:  No focal lesion identified. Within normal limits in parenchymal echogenicity. No intrahepatic biliary ductal dilatation.  IVC:  No abnormality visualized.  Pancreas:  Visualized portion unremarkable.  Spleen:  Size and appearance within normal limits. Measures 5.5 cm in length.  Right Kidney:  Length: 12.5 cm. Echogenicity within normal limits. No mass or hydronephrosis visualized.  Left Kidney:  Length: 12.4 cm. Echogenicity within normal limits. No mass or hydronephrosis visualized.  Abdominal aorta:  Largely obscured by overlying bowel gas. Measures up to 2.5 cm in diameter proximally.  Other findings:  Small volume of ascites.  Bilateral pleural effusions.   IMPRESSION: 1. No definite acute findings to account for the patient's symptoms. 2. Although the gallbladder wall appears mildly thickened at 4.4 mm, this is nonspecific and likely related to under distention of the gallbladder on today's examination, as well as a generalized state of anasarca given the presence of ascites and pleural effusions. No definite findings to suggest acute cholecystitis are noted at this time. Additionally, there is no intra or extrahepatic biliary ductal dilatation.    Electronically Signed   By: Vinnie Langton M.D.   On: 01/31/2014 07:18   Dg Chest Port 1 View 01/30/2014      IMPRESSION: 1. Right internal jugular central venous line tip in the lower superior vena cava. No pneumothorax. 2. Endotracheal tube well positioned.    Electronically Signed   By: Lajean Manes M.D.   On: 01/30/2014 21:01   Echocardiogram  Study Conclusions - Left ventricle: The cavity size was normal. Wall thickness was normal. Systolic function was normal. The estimated ejection fraction was in the range of 55% to 60%. - Aortic valve: Trivial regurgitation. - Left atrium: The atrium was mildly dilated. - Right ventricle: The cavity size was mildly dilated. Systolic function was mildly reduced. - Right atrium: The atrium was moderately dilated. - Atrial septum: No defect or patent foramen ovale was Identified.  Cardiac catheterization Hemodynamic Findings: Central aortic pressure: 141/85  Left ventricular pressure: 133/22/31  Angiographic Findings: Left main: No obstructive disease.  Left Anterior Descending Artery: Large caliber vessel that courses to the apex. The proximal vessel has diffuse 20% stenosis. The mid vessel has diffuse 40% stenosis. The distal vessel has mild plaque disease. There is one moderate caliber diagonal branch without obstructive disease.  Circumflex Artery: Large caliber vessel with four obtuse marginal branches. The first two obtuse marginal are very small in caliber with mild ostial stenosis. The third obtuse marginal branch is small in caliber with no obstructive disease. The fourth obtuse marginal branch is moderate in caliber with no obstructive disease.  Right Coronary Artery: Large dominant vessel with diffuse 30% mid stenosis. The PDA is large in caliber and patent with minimal plaque.  Left Ventricular Angiogram: Deferred.  Impression:  1. Mild non-obstructive CAD  2. S/p cardiac arrest  3. LV function is normal by echo but LV gram deferred today due to  renal insufficiency   12-lead ECG post arrest from [place] without ST-T wave  abnormalities Telemetry called [name] and spoke with RN who assisted with Joshua [name] care on 03/08 - there were no rhythm / tele strips printed from [place] while in Radiology department and he had ROSC once in ED  Telemetry while here reviewed - SR with occasional PVCs, few brief runs NSVT on 01/30/2014, longest 10 beats in duration; of note, telemetry strips reviewed from recent hospitalization here Feb 2015 - one sinus pause with junctional escape on 01/19/2014 at 16:59  Assessment and Plan 1. PEA arrest at outside hospital with ROSC after 4 minutes CPR - no rhythm strips for review from [facility]  - has had NSVT during this admission and evidence of sinus node dysfunction during admission last month - avoid rate slowing / AV nodal blocking medications and continue telemetry 2. Acute respiratory failure requiring continuous [place] O2 while here, ? etiology - work-up / evaluation per PCCM 3. Mild, nonobstructive CAD s/p cardiac cath today 4. Normal LVEF 5. Anemia, ? etiology and if contributing to dyspnea / hypoxia requiring [place] O2 while here - work-up / evaluation per primary team 6. Acute kidney injury 7. Bilateral peroneal DVT  Dr. Lovena Le to see. Please see recommendations below. Signed, Ileene Hutchinson, PA-C 02/02/2014, 1:50 PM   EP Attending  Patient seen and examined. Agree with the history, physical exam, assessment as provided by Ileene Hutchinson, PA-C. The patient has had a PEA arrest of unclear etiology. There has been no documentation of sustained ventricular or atrial arrhythmias or bradycardia and he has preserved LV function. There is no additional treatment from an EP perspective. He will need supportive care and transfer back to [facility] center. Unless there is more to the history, than I understand, a bit unclear on the indication for EP consult.  Mikle Bosworth.D.

## 2014-02-02 NOTE — CV Procedure (Signed)
      Cardiac Catheterization Operative Report  DAMAREA BRUCE AY:[id number] 3/11/201510:44 AM Duwaine Maxin, DO  Procedure Performed:  1. Left Heart Catheterization 2. Selective Coronary Angiography  Operator: Lauree Chandler, MD  Arterial access site:  Right radial artery.   Indication:  63 yo male with multiple medical problems including DM, HTN, recent prolonged hospitalization for C. Diff colitis then cardiac arrest in rehab facility. Cardiac markers negative. EKG without ischemic changes. Cardiac cath to exclude CAD. Pt also anemic with renal insufficiency.                                    Procedure Details: The risks, benefits, complications, treatment options, and expected outcomes were discussed with the patient. The patient and/or family concurred with the proposed plan, giving informed consent. The patient was brought to the cath lab after IV hydration was begun and oral premedication was given. The patient was not sedated due to his baseline level of sedation of hypoxia. The right wrist was assessed with an Allens test which was positive. The right wrist was prepped and draped in a sterile fashion. 1% lidocaine was used for local anesthesia. Using the modified Seldinger access technique, a 5 French sheath was placed in the right radial artery. 3 mg Verapamil was given through the sheath. 4000 units IV heparin was given. Standard diagnostic catheters were used to perform selective coronary angiography. LV pressures were measured. The sheath was removed from the right radial artery and a Terumo hemostasis band was applied at the arteriotomy site on the right wrist.    There were no immediate complications. The patient was taken to the recovery area in stable condition.   Hemodynamic Findings: Central aortic pressure: 141/85 Left ventricular pressure: 133/22/31  Angiographic Findings:  Left main: No obstructive disease.   Left Anterior Descending Artery: Large caliber  vessel that courses to the apex. The proximal vessel has diffuse 20% stenosis. The mid vessel has diffuse 40% stenosis. The distal vessel has mild plaque disease. There is one moderate caliber diagonal branch without obstructive disease.   Circumflex Artery: Large caliber vessel with four obtuse marginal branches. The first two obtuse marginal are very small in caliber with mild ostial stenosis. The third obtuse marginal branch is small in caliber with no obstructive disease. The fourth obtuse marginal branch is moderate in caliber with no obstructive disease.   Right Coronary Artery: Large dominant vessel with diffuse 30% mid stenosis. The PDA is large in caliber and patent with minimal plaque.   Left Ventricular Angiogram: Deferred.   Impression: 1. Mild non-obstructive CAD 2. S/p cardiac arrest 3. LV function is normal by echo but LV gram deferred today due to renal insufficiency  Recommendations: Continued medical management.        Complications:  None. The patient tolerated the procedure well.

## 2014-02-02 NOTE — Progress Notes (Addendum)
ANTICOAGULATION CONSULT NOTE - Initial Consult  Pharmacy Consult for Heparin Indication: h/o DVT  Allergies  Allergen Reactions  . Aspirin Itching    Patient Measurements: Height: 5\' 6"  (167.6 cm) Weight: 172 lb 13.5 oz (78.4 kg) IBW/kg (Calculated) : 63.8  Vital Signs: Temp: 97.9 F (36.6 C) (03/11 0000) Temp src: Oral (03/11 0000) BP: 191/90 mmHg (03/11 0200) Pulse Rate: 80 (03/11 0200)  Labs:  Recent Labs  01/30/14 2028 01/31/14 0108  01/31/14 0410 01/31/14 0630 02/01/14 0400 02/01/14 2000 02/02/14 0440  HGB  --   --   --  7.4*  --  7.9*  --  8.3*  HCT  --   --   --  23.4*  --  25.2*  --  26.7*  PLT  --   --   --  376  --  392  --  436*  APTT 46*  --   --  48*  --  52*  --  42*  LABPROT 42.2*  --   --  44.4*  --  27.3* 23.1* 19.0*  INR 4.68*  --   --  5.00*  --  2.64* 2.12* 1.64*  CREATININE 1.78*  --   < > 1.57*  --  1.59*  --  1.68*  TROPONINI <0.30 <0.30  --   --  <0.30  --   --   --   < > = values in this interval not displayed.  Estimated Creatinine Clearance: 44.9 ml/min (by C-G formula based on Cr of 1.68).  Assessment: 63 yo male with recent DVT, Coumadin on hold while awaiting cath, for heparin  Goal of Therapy:  Heparin level 0.3-0.7 units/ml Monitor platelets by anticoagulation protocol: Yes   Plan:  Start heparin 1100 units/hr F/U after cath today  Caryl Pina 02/02/2014,6:11 AM

## 2014-02-02 NOTE — Interval H&P Note (Signed)
History and Physical Interval Note:  02/02/2014 10:11 AM  Joshua [name]  has presented today for cardiac cath with the diagnosis of chest pain.  The various methods of treatment have been discussed with the patient and family. After consideration of risks, benefits and other options for treatment, the patient has consented to  Procedure(s): LEFT HEART CATHETERIZATION WITH CORONARY ANGIOGRAM (N/A) as a surgical intervention .  The patient's history has been reviewed, patient examined, no change in status, stable for surgery.  I have reviewed the patient's chart and labs.  Questions were answered to the patient's satisfaction.    Cath Lab Visit (complete for each Cath Lab visit)  Clinical Evaluation Leading to the Procedure:   ACS: no  Non-ACS:    Anginal Classification: No Symptoms Pt is post cardiac arrest. No chest pain  Anti-ischemic medical therapy: Maximal Therapy (2 or more classes of medications)  Non-Invasive Test Results: No non-invasive testing performed  Prior CABG: No previous CABG        Dermot Gremillion

## 2014-02-02 NOTE — Progress Notes (Signed)
[facility] [place] [facility] Progress Note Patient Name: Joshua [name] DOB: [birth year] MRN: [id number]  Date of Service  02/02/2014   HPI/Events of Note   Increased WOB Per RN, sounds wet  eICU Interventions  CXR lasix   Intervention Category Intermediate Interventions: Respiratory distress - evaluation and management  MCQUAID, DOUGLAS 02/02/2014, 10:08 PM

## 2014-02-02 NOTE — Progress Notes (Signed)
SUBJECTIVE:  No complaints this am  OBJECTIVE:   Vitals:   Filed Vitals:   02/02/14 0300 02/02/14 0500 02/02/14 0600 02/02/14 0729  BP: 190/105 154/72 183/87   Pulse: 85 83 91   Temp:  97.8 F (36.6 C)  97.5 F (36.4 C)  TempSrc:  Oral  Oral  Resp: 15 0 0   Height:      Weight:  170 lb 6.7 oz (77.3 kg)    SpO2: 96% 91% 93%    I&O's:   Intake/Output Summary (Last 24 hours) at 02/02/14 0806 Last data filed at 02/02/14 0700  Gross per 24 hour  Intake 1943.85 ml  Output    990 ml  Net 953.85 ml   TELEMETRY: Reviewed telemetry pt in NSR:     PHYSICAL EXAM General: Well developed, well nourished, in no acute distress Head: Eyes PERRLA, No xanthomas.   Normal cephalic and atramatic  Lungs:   Clear bilaterally to auscultation and percussion. Heart:   HRRR S1 S2 Pulses are 2+ & equal. Abdomen: Bowel sounds are positive, abdomen soft and non-tender without masses Extremities:   No clubbing, cyanosis or edema.  DP +1 Neuro: Alert and oriented X 3. Psych:  Good affect, responds appropriately   LABS: Basic Metabolic Panel:  Recent Labs  01/30/14 2028 01/31/14 0330  02/01/14 0400 02/02/14 0440  NA 143 145  < > 145 140  K 4.1 3.9  < > 4.2 4.2  CL 108 107  < > 108 103  CO2 26 24  < > 25 26  GLUCOSE 162* 88  < > 113* 160*  BUN 59* 54*  < > 43* 44*  CREATININE 1.78* 1.61*  < > 1.59* 1.68*  CALCIUM 8.9 8.8  < > 8.7 8.5  MG 2.1  --   --   --   --   PHOS 3.9 3.2  --   --   --   < > = values in this interval not displayed. Liver Function Tests:  Recent Labs  01/30/14 2028 01/31/14 0330 01/31/14 0410  AST 42*  --  31  ALT 59*  --  54*  ALKPHOS 449*  --  438*  BILITOT 0.3  --  0.3  PROT 6.4  --  6.4  ALBUMIN 1.7*  1.7* 1.7* 1.6*   No results found for this basename: LIPASE, AMYLASE,  in the last 72 hours CBC:  Recent Labs  01/30/14 1430  02/01/14 0400 02/02/14 0440  WBC 7.2  < > 8.3 9.9  NEUTROABS 5.4  --   --   --   HGB 7.5*  < > 7.9* 8.3*  HCT 24.0*   < > 25.2* 26.7*  MCV 79.7  < > 77.8* 78.3  PLT 334  < > 392 436*  < > = values in this interval not displayed. Cardiac Enzymes:  Recent Labs  01/30/14 2028 01/31/14 0108 01/31/14 0630  TROPONINI <0.30 <0.30 <0.30   BNP: No components found with this basename: POCBNP,  D-Dimer:  Recent Labs  02/01/14 0400  DDIMER 1.76*   Hemoglobin A1C: No results found for this basename: HGBA1C,  in the last 72 hours Fasting Lipid Panel: No results found for this basename: CHOL, HDL, LDLCALC, TRIG, CHOLHDL, LDLDIRECT,  in the last 72 hours Thyroid Function Tests: No results found for this basename: TSH, T4TOTAL, FREET3, T3FREE, THYROIDAB,  in the last 72 hours Anemia Panel: No results found for this basename: VITAMINB12, FOLATE, FERRITIN, TIBC, IRON, RETICCTPCT,  in the  last 72 hours Coag Panel:   Lab Results  Component Value Date   INR 1.64* 02/02/2014   INR 2.12* 02/01/2014   INR 2.64* 02/01/2014    RADIOLOGY: Ct Abdomen Pelvis Wo Contrast  01/30/2014   CLINICAL DATA:  Cardiac arrest.  EXAM: CT CHEST, ABDOMEN AND PELVIS WITHOUT CONTRAST  TECHNIQUE: Multidetector CT imaging of the chest, abdomen and pelvis was performed following the standard protocol without IV contrast.  COMPARISON:  None.  CT CHEST W/O CM dated 01/07/2014; DG CHEST 1 VIEW dated 01/30/2014  FINDINGS: CT CHEST FINDINGS  The endotracheal tube extends into the right mainstem bronchus. This was immediately called to Dr. Dina Rich on 01/30/2014 at 3 p.m. Nasogastric tube extends into the abdomen. There appears to be prominent mediastinal lymph nodes which are similar to the previous examination but poorly characterized due to the lack of contrast and motion. There are small bilateral pleural effusions, left side greater than right. No significant pericardial fluid. There is diffuse subcutaneous edema. There is compressive atelectasis in lower lobes bilaterally. Patchy densities in left upper lung. Airspace densities within the right middle  lobe. There are subtle densities in the right upper lung. No evidence for a large pneumothorax. No acute bone abnormality.  CT ABDOMEN AND PELVIS FINDINGS  There is no evidence for free air. Nasogastric tube extends into the distal stomach. There is diffuse subcutaneous edema. There is a small amount of abdominal ascites. Unenhanced CT was performed per clinician order. Lack of IV contrast limits sensitivity and specificity, especially for evaluation of abdominal/pelvic solid viscera. Gallbladder may be mildly distended but poorly characterized. No gross abnormality to the liver or spleen. Limited evaluation of the pancreas and adrenal glands. No gross abnormality to the kidneys. There are prominent small bowel loops in the left abdomen with mesenteric edema. Bowel wall thickening, particularly on sequence 2, image 86. Fluid in the urinary bladder. No acute bone abnormality.  IMPRESSION: The study is very limited due to the lack of contrast and motion.  Prominent small bowel loops in the left abdomen with mesenteric edema. There is concern for bowel wall thickening in this area. Based on the history of cardiac arrest, ischemic bowel is in the differential diagnosis. Findings could also be related to an infectious enteritis.  Bilateral small pleural effusions with patchy parenchymal lung densities. Findings could represent atelectasis and asymmetric edema. Infection cannot excluded, particularly in the right middle lobe.  Endotracheal tube in the right mainstem bronchus as described.  Small amount of abdominal ascites and diffuse subcutaneous edema. Findings are concerning for anasarca.  Mild distention of the gallbladder.   Electronically Signed   By: Markus Daft M.D.   On: 01/30/2014 15:18   Dg Chest 1 View  01/30/2014   CLINICAL DATA:  Status post cardiac arrest ; assess endotracheal tube and nasogastric tube position  EXAM: CHEST - 1 VIEW  COMPARISON:  CT CHEST W/O CM dated 01/30/2014  FINDINGS: The trachea has  undergone interval intubation. The tip of the ET tube lies in the proximal right mainstem bronchus. The lung volumes are reasonably well maintained. There are coarse interstitial markings bilaterally with near confluence at the right lung base.  The esophagogastric tube tip and proximal port project below the inferior margin of the film. The cardiac silhouette is mildly enlarged. The pulmonary vascularity is not engorged. External pacemaker pads are present.  IMPRESSION: 1. The endotracheal tube tip is at the margin of the right main stem bronchus and withdrawal by 5 cm  is recommended. 2. The esophagogastric tube appears to be in appropriate position. 3. The pulmonary interstitial markings are mildly increased bilaterally especially on the right. Confluent density at the right lung base suggests atelectasis or pneumonia. 4. These results were called by telephone at the time of interpretation on 01/30/2014 at 2:55 PM to Doylene Canard, RN,, who verbally acknowledged these results.   Electronically Signed   By: David  Martinique   On: 01/30/2014 14:56   Dg Chest 2 View  01/26/2014   CLINICAL DATA:  Shortness of breath, lower extremity edema  EXAM: CHEST  2 VIEW  COMPARISON:  01/20/2014  FINDINGS: Cardiomegaly again noted. Bilateral small pleural effusion with bilateral basilar atelectasis or infiltrate. No pulmonary edema. Mild degenerative changes thoracic spine.  IMPRESSION: Cardiomegaly. Bilateral small pleural effusion with bilateral basilar atelectasis or infiltrate.   Electronically Signed   By: Lahoma Crocker M.D.   On: 01/26/2014 11:35   Dg Chest 2 View  01/20/2014   CLINICAL DATA:  Followup right lung volume loss. Shortness of breath.  EXAM: CHEST  2 VIEW  COMPARISON:  01/08/2014 and 01/07/2014  FINDINGS: Interval removal of nasogastric tube, right subclavian central venous catheter and endotracheal tubes. Lungs are hypoinflated with continued evidence of a small left pleural effusion likely with associated  atelectasis in the left base. There is slightly better aeration in the left base compared to the previous exam. There is slight worsening of a small right pleural effusion likely with associated atelectasis. Stable cardiomegaly. Remainder the exam is unchanged.  IMPRESSION: Mild worsening of a small right pleural effusion likely with associated right basilar atelectasis. Persistent small left pleural effusion without significant change. Persistent opacification in the left base with slightly better aeration which may be due to atelectasis or infection.   Electronically Signed   By: Marin Olp M.D.   On: 01/20/2014 12:18   Dg Chest 2 View  01/05/2014   CLINICAL DATA:  Reflux and chest congestion after eating.  EXAM: CHEST  2 VIEW  COMPARISON:  03/30/2013  FINDINGS: There is volume loss at the right lung base compared to the previous examination. Difficult to exclude bronchiectasis at the right lung base. There is no evidence for airspace disease within the right lung. Prominent right hilar structures are probably related to vascular crowding but this has clearly progressed since 08/31/2010. There are chronic pleural-based densities at the left lung base which could be related to volume loss and pleural thickening. Upper lungs are clear. Heart size is stable and within normal limits. The trachea is midline.  IMPRESSION: Chronic left basilar densities are suggestive for pleural-based disease. Findings could be related to a loculated effusion or pleural thickening.  Volume loss and probable vascular crowding at the right lung base. Recommend follow-up to ensure improved aeration at the right lung base and follow the right hilar densities. Alternately, consider further evaluation with a chest CT which could also evaluate the left pleural-based disease.   Electronically Signed   By: Markus Daft M.D.   On: 01/05/2014 17:41   Dg Abd 1 View  01/30/2014   CLINICAL DATA:  Abdominal tenderness and loose stools.  EXAM:  ABDOMEN - 1 VIEW  COMPARISON:  DG ABD PORTABLE 1V dated 01/07/2014  FINDINGS: The bowel gas pattern is normal. No obstruction or significant ileus is visualized. No abnormal calcifications or soft tissue abnormalities are identified. Mild degenerative changes are present of the lower lumbar spine.  IMPRESSION: Unremarkable abdominal radiograph.   Electronically Signed  By: Aletta Edouard M.D.   On: 01/30/2014 14:24   Ct Head Wo Contrast  01/30/2014   CLINICAL DATA:  Status post cardiac arrest, query intracranial hemorrhage.  EXAM: CT HEAD WITHOUT CONTRAST  TECHNIQUE: Contiguous axial images were obtained from the base of the skull through the vertex without intravenous contrast.  COMPARISON:  CT HEAD W/O CM dated 01/07/2014  FINDINGS: The brainstem, cerebellum, cerebral peduncles, thalamus, basal ganglia, basilar cisterns, and ventricular system appear within normal limits. Periventricular white matter and corona radiata hypodensities favor chronic ischemic microvascular white matter disease. No intracranial hemorrhage, mass lesion, or acute CVA. Mucosal thickening in the nasal cavity noted. The mild chronic maxillary, ethmoid, and left sphenoid sinusitis.  IMPRESSION: 1. No intracranial hemorrhage, findings of acute CVA, or other acute intracranial abnormality. 2. Chronic paranasal sinusitis with mucosal swelling and frothy fluid in the nasal cavity.   Electronically Signed   By: Sherryl Barters M.D.   On: 01/30/2014 16:04   Ct Head Wo Contrast  01/07/2014   CLINICAL DATA:  Seizure with altered mental status.  EXAM: CT HEAD WITHOUT CONTRAST  TECHNIQUE: Contiguous axial images were obtained from the base of the skull through the vertex without contrast.  COMPARISON:  None  FINDINGS: Mildly premature cerebral and cerebellar atrophy. Hypoattenuation white matter, likely chronic microvascular ischemic change. No acute stroke or hemorrhage. No visible mass lesion or hydrocephalus. No extra-axial fluid. Calvarium  intact. Minimal layering fluid in the sphenoid. No acute middle ear or mastoid fluid. Negative orbits.  IMPRESSION: Premature atrophy. Suspected chronic microvascular ischemic change without large vessel infarct. No acute intracranial findings.   Electronically Signed   By: Rolla Flatten M.D.   On: 01/07/2014 20:35   Ct Chest Wo Contrast  01/30/2014   CLINICAL DATA:  Cardiac arrest.  EXAM: CT CHEST, ABDOMEN AND PELVIS WITHOUT CONTRAST  TECHNIQUE: Multidetector CT imaging of the chest, abdomen and pelvis was performed following the standard protocol without IV contrast.  COMPARISON:  None.  CT CHEST W/O CM dated 01/07/2014; DG CHEST 1 VIEW dated 01/30/2014  FINDINGS: CT CHEST FINDINGS  The endotracheal tube extends into the right mainstem bronchus. This was immediately called to Dr. Dina Rich on 01/30/2014 at 3 p.m. Nasogastric tube extends into the abdomen. There appears to be prominent mediastinal lymph nodes which are similar to the previous examination but poorly characterized due to the lack of contrast and motion. There are small bilateral pleural effusions, left side greater than right. No significant pericardial fluid. There is diffuse subcutaneous edema. There is compressive atelectasis in lower lobes bilaterally. Patchy densities in left upper lung. Airspace densities within the right middle lobe. There are subtle densities in the right upper lung. No evidence for a large pneumothorax. No acute bone abnormality.  CT ABDOMEN AND PELVIS FINDINGS  There is no evidence for free air. Nasogastric tube extends into the distal stomach. There is diffuse subcutaneous edema. There is a small amount of abdominal ascites. Unenhanced CT was performed per clinician order. Lack of IV contrast limits sensitivity and specificity, especially for evaluation of abdominal/pelvic solid viscera. Gallbladder may be mildly distended but poorly characterized. No gross abnormality to the liver or spleen. Limited evaluation of the pancreas  and adrenal glands. No gross abnormality to the kidneys. There are prominent small bowel loops in the left abdomen with mesenteric edema. Bowel wall thickening, particularly on sequence 2, image 86. Fluid in the urinary bladder. No acute bone abnormality.  IMPRESSION: The study is very limited due to  the lack of contrast and motion.  Prominent small bowel loops in the left abdomen with mesenteric edema. There is concern for bowel wall thickening in this area. Based on the history of cardiac arrest, ischemic bowel is in the differential diagnosis. Findings could also be related to an infectious enteritis.  Bilateral small pleural effusions with patchy parenchymal lung densities. Findings could represent atelectasis and asymmetric edema. Infection cannot excluded, particularly in the right middle lobe.  Endotracheal tube in the right mainstem bronchus as described.  Small amount of abdominal ascites and diffuse subcutaneous edema. Findings are concerning for anasarca.  Mild distention of the gallbladder.   Electronically Signed   By: Markus Daft M.D.   On: 01/30/2014 15:18   Ct Chest Wo Contrast  01/07/2014   CLINICAL DATA:  Respiratory distress requiring intubation  EXAM: CT CHEST WITHOUT CONTRAST  TECHNIQUE: Multidetector CT imaging of the chest was performed following the standard protocol without IV contrast.  COMPARISON:  Chest x-ray earlier today  FINDINGS: Mediastinum: The patient is intubated. The ET tube terminates well above the carina. Hyperdense thyroid gland is nonspecific. Evaluation of the mediastinum for lymphadenopathy is limited given respiratory motion and absence of intravenous contrast material. There appear to be some a high left paratracheal borderline enlarged lymph nodes measuring up to 1 cm in short axis. Prevascular nodal tissue also measures 1 cm in short axis. Low right paratracheal node measures 1 cm in short axis. Incompletely imaged nasogastric tube traverses the thoracic esophagus.   Heart/Vascular: Limited evaluation in the absence of IV contrast. Cardiomegaly with left and right heart enlargement. No pericardial effusion. No definite aortic aneurysmal dilatation. Well-positioned right subclavian central venous catheter. Catheter tip is at the superior cavoatrial junction.  Lungs/Pleura: Moderate left larger than right pleural effusions with associated bibasilar atelectasis. The aerated portions of the upper lungs demonstrate no significant focal airspace consolidation or suspicious pulmonary nodule. Mild interlobular septal thickening consistent with a mild interstitial edema. Respiratory motion limits evaluation for small pulmonary nodules. Secretions are noted within the trachea and there is a cut off of the right lower lobe bronchus.  Bones/Soft Tissues: No acute fracture or aggressive appearing lytic or blastic osseous lesion.  Upper Abdomen:  Unremarkable limited imaging of the upper abdomen.  IMPRESSION: 1. Moderate left larger than right bilateral pleural effusions with associated bibasilar atelectasis. 2. Mild interstitial pulmonary edema. In the setting of cardiomegaly, this is favored to reflect CHF. 3. Frothy secretions noted in the trachea distal to the endotracheal tube and within the right lower lobe bronchus resulting in a bronchial cut off sign. Strictly speaking, an endobronchial lesion cannot be completely excluded radiographically. 4. Nonspecific mediastinal adenopathy may be reactive, or related to chronic CHF. 5. Satisfactory position of support apparatus as above.   Electronically Signed   By: Jacqulynn Cadet M.D.   On: 01/07/2014 20:49   Nm Hepatobiliary  01/12/2014   CLINICAL DATA:  Upper abdominal pain  EXAM: NUCLEAR MEDICINE HEPATOBILIARY IMAGING  Views:  Anterior right upper quadrant  Radionuclide:  Technetium 15m Choletec  Dose:  5.0 mCi  Route of administration: Intravenous  COMPARISON:  None.  FINDINGS: Liver uptake of radiotracer is normal. There is prompt  visualization of gallbladder and small bowel, indicating patency of the cystic and common bile ducts.  IMPRESSION: Study within normal limits.   Electronically Signed   By: Lowella Grip M.D.   On: 01/12/2014 13:14   US Renal  01/14/2014   CLINICAL DATA:  Acute kidney  injury.  Renal insufficiency.  EXAM: RENAL/URINARY TRACT ULTRASOUND COMPLETE  COMPARISON:  None.  FINDINGS: Right Kidney:  Length: 11.6 cm. Diffusely increased parenchymal echogenicity seen, consistent with medical renal disease. No mass or hydronephrosis visualized.  Left Kidney:  Length: 11.9 cm. Diffusely increased parenchymal echogenicity seen, consistent with medical renal disease. No mass or hydronephrosis visualized. Left pleural effusion also noted.  Bladder:  Empty with Foley catheter in place.  IMPRESSION: Normal renal size with increased parenchymal echogenicity, consistent with medical renal disease. No evidence of hydronephrosis.  Left pleural effusion incidentally noted.   Electronically Signed   By: Earle Gell M.D.   On: 01/14/2014 21:09   Mr Foot Left W Wo Contrast  01/09/2014   CLINICAL DATA:  Nonhealing wound on the plantar surface of the foot the first MTP joint in the diabetic patient.  EXAM: MRI OF THE LEFT FOREFOOT WITHOUT AND WITH CONTRAST  TECHNIQUE: Multiplanar, multisequence MR imaging was performed both before and after administration of intravenous contrast.  CONTRAST:  15 mL MULTIHANCE GADOBENATE DIMEGLUMINE 529 MG/ML IV SOLN  COMPARISON:  Plain films of the left foot 01/05/2014.  FINDINGS: There is a skin ulceration on the plantar surface the foot at the level the first MTP joint. Extensive subcutaneous edema and enhancement are present about the foot consistent with cellulitis. No bone marrow signal abnormality to suggest osteomyelitis is seen. The medial sesamoid bone demonstrates decreased signal on both T1 and T2 weighted images without postcontrast enhancement likely due to chronic osteonecrosis. All  visualized intrinsic musculature of the foot demonstrates increased T2 signal. There is postcontrast enhancement in the flexor hallucis brevis muscle. The patient has a small first MTP joint effusion.  IMPRESSION: Skin ulceration on the plantar surface of the foot at the first MTP joint without underlying abscess or osteomyelitis. Changes of diffuse cellulitis are identified. Edema and enhancement in the flexor hallucis brevis muscle are compatible with myositis.  Small first MTP joint effusion is likely aseptic and incidental.  Appearance of the medial sesamoid bone of the first MTP joint most consistent with chronic osteonecrosis.   Electronically Signed   By: Inge Rise M.D.   On: 01/09/2014 13:06   Nm Pulmonary Perf And Vent  02/01/2014   CLINICAL DATA Shortness of breath, evaluate for pulmonary embolism  EXAM NUCLEAR MEDICINE VENTILATION - PERFUSION LUNG SCAN  TECHNIQUE Ventilation images were obtained in multiple projections using inhaled aerosol technetium 99 M DTPA. Perfusion images were obtained in multiple projections after intravenous injection of Tc-47m MAA.  RADIOPHARMACEUTICALS 40 mCi Tc-47m DTPA aerosol and 6 mCi Tc-28m MAA  COMPARISON NM PULMONARY VENT & PERF dated 01/09/2014; DG CHEST 1V PORT dated 01/30/2014; DG CHEST 1 VIEW dated 01/30/2014; DG CHEST 2 VIEW dated 01/26/2014  FINDINGS Review of chest radiograph performed 01/30/2014 demonstrates an enlarged cardiac silhouette and mediastinal contours. Stable position of support apparatus. No pneumothorax. External pacing pads overlie left ventricular apex. The pulmonary vasculature is indistinct with cephalization of flow. Grossly unchanged perihilar and bilateral medial basilar opacities. Unchanged small bilateral effusions. No definite pneumothorax.  Ventilation: Review the ventilatory images demonstrates clumping of inhaled radiotracer about the bilateral pulmonary hila, left greater than right. Inhaled radiotracer is seen layering within the  left mainstem bronchus. There is overall poor ventilation of the bilateral lungs. A minimal amount of radiotracer is seen within the hypopharynx.  Perfusion: There is relative homogeneous distribution of injected radiotracer throughout the bilateral pulmonary parenchyma. There is minimal oligemia about the right major fissure and  blunting of the left costophrenic angle, both of which are unchanged and likely secondary to the patient's known small bilateral pleural effusions. No discrete mismatched segmental or subsegmental filling defects.  IMPRESSION Pulmonary embolism absent (very low probability for pulmonary embolism).  SIGNATURE  Electronically Signed   By: Sandi Mariscal M.D.   On: 02/01/2014 16:09   Nm Pulmonary Perf And Vent  01/09/2014   CLINICAL DATA:  Evaluate for pulmonary embolism  EXAM: NUCLEAR MEDICINE VENTILATION - PERFUSION LUNG SCAN  TECHNIQUE: Ventilation images were obtained in multiple projections using inhaled aerosol technetium 99 M DTPA. Perfusion images were obtained in multiple projections after intravenous injection of Tc-21m MAA.  COMPARISON:  DG CHEST 1V PORT dated 01/08/2014  RADIOPHARMACEUTICALS:  40 mCi Tc-37m DTPA aerosol and 6 mCi Tc-56m MAA  FINDINGS: Review of chest radiograph performed and prior demonstrates enlarged cardiac silhouette and mediastinal contours. Endotracheal tube overlies the tracheal air column with tip superior to the carina. There are bibasilar heterogeneous opacities, left greater than right. Small left-sided pleural effusion. Mild pulmonary venous congestion without definite evidence of edema.  Ventilation: There is clumping of inhaled radiotracer about the bilateral pulmonary hila. There is otherwise adequate ventilation of the bilateral pulmonary parenchyma. There is ingested radiotracer seen within the hypopharynx, stomach and proximal small bowel.  Perfusion: There is a linear area of relative oligemia about the right major fissure secondary to known  partially layering pleural effusion. There is otherwise relative homogeneous distribution of injected radiotracer throughout the pulmonary parenchyma. There are no discrete mismatched segmental or subsegmental filling defects to suggest pulmonary embolism.  IMPRESSION: Pulmonary embolism absent (very low probability for pulmonary embolism).   Electronically Signed   By: Sandi Mariscal M.D.   On: 01/09/2014 11:44   US Abdomen Port  01/31/2014   CLINICAL DATA:  Elevated liver function tests.  EXAM: ULTRASOUND PORTABLE ABDOMEN  COMPARISON:  CT of the abdomen and pelvis 01/30/2014. Abdominal ultrasound 01/08/2014.  FINDINGS: Gallbladder:  No gallstones. Gallbladder is only partially distended. Gallbladder wall thickness is slightly increased at 4.4 mm (likely a reflection of under distention). No pericholecystic fluid. Per report from the sonographer, the patient did not exhibit a sonographic Murphy's sign on examination.  Common bile duct:  Diameter: Normal in caliber measuring 5.6 mm in the porta hepatis.  Liver:  No focal lesion identified. Within normal limits in parenchymal echogenicity. No intrahepatic biliary ductal dilatation.  IVC:  No abnormality visualized.  Pancreas:  Visualized portion unremarkable.  Spleen:  Size and appearance within normal limits. Measures 5.5 cm in length.  Right Kidney:  Length: 12.5 cm. Echogenicity within normal limits. No mass or hydronephrosis visualized.  Left Kidney:  Length: 12.4 cm. Echogenicity within normal limits. No mass or hydronephrosis visualized.  Abdominal aorta:  Largely obscured by overlying bowel gas. Measures up to 2.5 cm in diameter proximally.  Other findings:  Small volume of ascites.  Bilateral pleural effusions.  IMPRESSION: 1. No definite acute findings to account for the patient's symptoms. 2. Although the gallbladder wall appears mildly thickened at 4.4 mm, this is nonspecific and likely related to under distention of the gallbladder on today's examination,  as well as a generalized state of anasarca given the presence of ascites and pleural effusions. No definite findings to suggest acute cholecystitis are noted at this time. Additionally, there is no intra or extrahepatic biliary ductal dilatation.   Electronically Signed   By: Vinnie Langton M.D.   On: 01/31/2014 07:18   [place] Art/ven Flow  Abd Pelv Doppler  01/08/2014   CLINICAL DATA:  Hypotension and worsening liver function tests.  EXAM: DUPLEX ULTRASOUND OF LIVER  TECHNIQUE: Color and duplex Doppler ultrasound was performed to evaluate the hepatic in-flow and out-flow vessels.  COMPARISON:  DG ABD PORTABLE 1V dated 01/07/2014; CT CHEST W/O CM dated 01/07/2014; US ABDOMEN LIMITED RUQ/ASCITES dated 01/08/2014  FINDINGS: Portal Vein Velocities  Main:  28-30 cm/sec  Right:  21 cm/sec  Left:  19 cm/sec  Hepatic Vein Velocities  Right:  48 cm/sec  Middle:  44 cm/sec  Left:  40 cm/sec  Hepatic Artery Velocity:  60 cm/sec  Splenic Vein Velocity:  21 cm/sec  Varices: None identified.  Ascites: None identified.  The portal and main hepatic veins are widely patent without evidence of thrombus. Flow direction and velocities are normal. The intrahepatic IVC is widely patent. The spleen is normal in size.  IMPRESSION: Unremarkable duplex ultrasound of the liver demonstrating no evidence of portal vein thrombosis or hepatic veno-occlusive disease.   Electronically Signed   By: Aletta Edouard M.D.   On: 01/08/2014 11:57   Dg Chest Port 1 View  01/30/2014   CLINICAL DATA:  Central line placement.  EXAM: PORTABLE CHEST - 1 VIEW  COMPARISON:  01/30/2014 1431 hr  FINDINGS: Right internal jugular central venous line has its tip in the lower superior vena cava. There is no pneumothorax.  Endotracheal tube has been retracted. Tip now lies 3.7 cm above the chronic, well positioned.  Nasogastric tube is stable passing well into the stomach. Lung base opacity is also unchanged.  IMPRESSION: 1. Right internal jugular central venous line  tip in the lower superior vena cava. No pneumothorax. 2. Endotracheal tube well positioned.   Electronically Signed   By: Lajean Manes M.D.   On: 01/30/2014 21:01   Dg Chest Port 1 View  01/08/2014   CLINICAL DATA:  Respiratory failure.  EXAM: PORTABLE CHEST - 1 VIEW  COMPARISON:  CT CHEST W/O CM dated 01/07/2014; DG CHEST 1V PORT dated 01/07/2014  FINDINGS: Endotracheal tube remains with the tip approximately 3.5 cm above the carina. The lungs show improved aeration at both bases with residual atelectasis remaining, left greater than right. There is a small left pleural effusion. No overt pulmonary edema is identified. The heart remains mildly enlarged. No pneumothorax is present. Central line positioning is stable.  IMPRESSION: Improved aeration of both lower lobes, residual atelectasis remains, left greater than right with associated left pleural effusion.   Electronically Signed   By: Aletta Edouard M.D.   On: 01/08/2014 08:48   Dg Chest Port 1 View  01/07/2014   CLINICAL DATA:  ETT and central venous catheter.  EXAM: PORTABLE CHEST - 1 VIEW  COMPARISON:  01/07/2014 and 01/05/2014  FINDINGS: Enteric tube courses through the region of the stomach and off the inferior portion of the film as tip is not visualized. Endotracheal tube is present with tip 3.4 cm above the carina. A right subclavian central venous catheter is present with tip over the SVC below the level of the carina. Lungs are hypoinflated. There is no right-sided pneumothorax. There is mild worsening perihilar bibasilar opacification as findings likely due to small effusions with bibasilar atelectasis and possible mild vascular congestion. Cannot exclude infection. Mild stable cardiomegaly. Remainder of the exam is unchanged.  IMPRESSION: Hypoinflation with mild perihilar bibasilar opacification likely small effusions with bibasilar atelectasis. There is likely a component of vascular congestion. Cannot exclude infection in the lung bases.  Tubes and lines as described.  No evidence of pneumothorax.   Electronically Signed   By: Marin Olp M.D.   On: 01/07/2014 18:48   Dg Chest Port 1 View  01/07/2014   CLINICAL DATA:  Shortness of breath.  EXAM: PORTABLE CHEST - 1 VIEW  COMPARISON:  01/05/2014.  FINDINGS: The cardiac silhouette, mediastinal and hilar contours are Stable. Mild cardiac enlargement. There are bilateral effusions, left greater than right with overlying atelectasis. Low lung volumes with vascular crowding. Mild central vascular congestion but no overt pulmonary edema. The bony thorax is intact.  IMPRESSION: Persistent effusions and atelectasis.  Low lung volumes with vascular crowding.   Electronically Signed   By: Kalman Jewels M.D.   On: 01/07/2014 16:40   Dg Abd Portable 1v  01/07/2014   CLINICAL DATA:  Severe right-side abdominal pain  EXAM: PORTABLE ABDOMEN - 1 VIEW  COMPARISON:  Portable exam 1622 hr without priors for comparison  FINDINGS: Scattered bowel loops.  No bowel dilatation or bowel wall thickening.  Small left pleural effusion.  Slightly increased attenuation of the abdomen and nonvisualization of hepatic and splenic margins could reflect ascites.  Bones unremarkable.  No definite urinary tract calcification.  IMPRESSION: Nonobstructive bowel gas pattern.  Small left pleural effusion.  Cannot exclude ascites.   Electronically Signed   By: Lavonia Dana M.D.   On: 01/07/2014 16:32   Dg Foot Complete Left  01/05/2014   CLINICAL DATA:  Left-sided foot pain.  EXAM: LEFT FOOT - COMPLETE 3+ VIEW  COMPARISON:  No priors.  FINDINGS: Three views of the left foot demonstrate diffuse soft tissue swelling of the distal aspect of the great toe. Along the plantar surface of the foot there are several locular some gas in the swollen soft tissues beneath the distal first metatarsal, concerning for an ulcer and soft tissue infection. No definite osteolysis in the underlying bone to strongly suggest the presence of osteomyelitis  at this time. No acute displaced fracture, subluxation or dislocation.  IMPRESSION: 1. Probable soft tissue ulceration/infection with gas in the plantar aspect of the foot beneath the great toe (adjacent to the distal first metatarsal). No definite signs to strongly suggest frank osteomyelitis at this time, but clinical correlation is recommended.   Electronically Signed   By: Vinnie Langton M.D.   On: 01/05/2014 17:30   US Abdomen Limited Ruq  01/08/2014   CLINICAL DATA:  Abdominal pain  EXAM: US ABDOMEN LIMITED - RIGHT UPPER QUADRANT  COMPARISON:  [place] ART/VEN ABD/PELV/SCROTUM DOP dated 01/08/2014; CT CHEST W/O CM dated 01/07/2014  FINDINGS: Gallbladder:  There is marked thickening of the gallbladder wall measuring approximately 1.1 cm in diameter (image 8). This finding is associated with potential very minimal amount of associated pericholecystic fluid. No definitive radiopaque gallstones or gallbladder sludge. Sonographic Percell Miller sign is unable to be performed secondary to patient is unresponsive state.  Common bile duct:  Diameter: Normal in size measuring 5.8 mm in diameter  Liver:  Homogeneous hepatic echotexture. No discrete hepatic lesions. No definite evidence of intrahepatic biliary ductal dilatation. No ascites.  IMPRESSION: Findings are worrisome for acute acalculous cholecystitis. Further evaluation could be performed with HIDA scan as clinically indicated.  These results will be called to the ordering clinician or representative by the Radiologist Assistant, and communication documented in the PACS Dashboard.   Electronically Signed   By: Sandi Mariscal M.D.   On: 01/08/2014 12:07   ASSESSMENT:  1. Cardiac arrest - asystole then to PEA.  He was on beta blockers and verapamil at home. Acute PE ruled out by VQ scan this admit.  2D echo shows normal LVF and EKG shows only nonspecific T wave abnormality. Cardiac enzymes are normal.  2. Acute on chronic renal failure - stable  3. Recent DVT  4. Diabetic  foot ulcer, L with wound infection  5. HTN - poorly controlled  6. Seizure disorder on last hospitalization  7. Acute respiratory failure in the setting of cardiac arrest  8. Abdominal distention with diarrhea   PLAN:  1. Beta blockers and Verapamil on hold for now  2. Continue hydralazine and amlodipine for BP control since stopping verapamil and coreg  3. Increase Hydralazine to 50mg  QID for better Bp control 4. NPO for cath - coronaries only due to underlying renal disease 5. Cardiac catheterization was discussed with the patient fully including risks on myocardial infarction, death, stroke, bleeding, arrhythmia, dye allergy, renal insufficiency or bleeding. All patient questions and concerns were discussed and the patient understands and is willing to proceed.        Sueanne Margarita, MD  02/02/2014  8:06 AM

## 2014-02-02 NOTE — Progress Notes (Signed)
[facility]  Name: Joshua [name] MRN: [id number] DOB: 1951/05/17    ADMISSION DATE:  01/30/2014 CONSULTATION DATE:  01/30/2014  REFERRING MD :  [facility] PRIMARY SERVICE: PCCM  CHIEF COMPLAINT:  Asystole / PEA arrest  BRIEF PATIENT DESCRIPTION: 63 yo who was undergoing KUB due to diarrhea and abdominal distention when he developed asystole / PEA arrest with ROSC after 14 minutes.  Transferred to [facility] in consideration for hypothermia.  Neurologically intact on arrival.  Hypothermia protocol aborted.  SIGNIFICANT EVENTS / STUDIES:  3/8  Head CT >>> nad 3/8  Abdomen CT >>> Bowel edema, small amount of ascites, small pleural effusions, bibasilar airspace disase 3/9  US abdomen >>> No definitive findings suggesting cholecystitis 3/9  TTE >>> EF 60, mildly;y dilated RV with mildly reduced systolic function 3/9  Venous Doppler LE BL >>> No DVT 3/10 VQ scan >>> No PE 3/11 Cardiac cath >>>  LINES / TUBES: R IJ TLC 3/8 >>> L Rad AL 3/8 >>> 3/10 OETT 3/8 >>> 3/8 OGT  3/8 >>> 3/9 Foley 3/8 >>> 3/9  CULTURES: 3/8  MRSA PCR >>> neg 3/8  Blood >>>  ANTIBIOTICS: Levaquin 3/8 >>> 3/9 Zosyn 3/9 >>> 3/11 Vancomycin 3/9 >>> 3/10  INTERVAL HISTORY:  Remains extubated.  VITAL SIGNS: Temp:  [97.5 F (36.4 C)-97.9 F (36.6 C)] 97.5 F (36.4 C) (03/11 0729) Pulse Rate:  [70-91] 88 (03/11 0900) Cardiac Rhythm:  [-] Normal sinus rhythm (03/11 0825) Resp:  [0-21] 10 (03/11 0900) BP: (154-194)/(66-144) 175/77 mmHg (03/11 0900) SpO2:  [90 %-100 %] 94 % (03/11 0900) Weight:  [77.3 kg (170 lb 6.7 oz)] 77.3 kg (170 lb 6.7 oz) (03/11 0500)  HEMODYNAMICS:   VENTILATOR SETTINGS:    INTAKE / OUTPUT: Intake/Output     03/10 0701 - 03/11 0700 03/11 0701 - 03/12 0700   P.O. 1080    I.V. (mL/kg) 713.9 (9.2)    IV Piggyback 150    Total Intake(mL/kg) 1943.9 (25.1)    Urine (mL/kg/hr) 990 (0.5)    Total Output 990     Net +953.9          Urine Occurrence 1 x    Stool  Occurrence 4 x         PHYSICAL EXAMINATION: General:  Resting in no distress Neuro:  Awake, alert HEENT:  PERRL, no JVD Cardiovascular:  Regular, no murmurs Lungs:  CTAB Abdomen:  Soft, nontender, bowel sounds normal Musculoskeletal:  L foot wounds look clean, without odor or purulent exudate  Skin:  Intact  LABS: CBC  Recent Labs Lab 01/31/14 0410 02/01/14 0400 02/02/14 0440  WBC 7.4 8.3 9.9  HGB 7.4* 7.9* 8.3*  HCT 23.4* 25.2* 26.7*  PLT 376 392 436*   Coag's  Recent Labs Lab 01/31/14 0410 02/01/14 0400 02/01/14 2000 02/02/14 0440  APTT 48* 52*  --  42*  INR 5.00* 2.64* 2.12* 1.64*   BMET  Recent Labs Lab 01/31/14 0410 02/01/14 0400 02/02/14 0440  NA 144 145 140  K 3.9 4.2 4.2  CL 107 108 103  CO2 26 25 26   BUN 53* 43* 44*  CREATININE 1.57* 1.59* 1.68*  GLUCOSE 88 113* 160*   Electrolytes  Recent Labs Lab 01/30/14 2028 01/31/14 0330 01/31/14 0410 02/01/14 0400 02/02/14 0440  CALCIUM 8.9 8.8 8.8 8.7 8.5  MG 2.1  --   --   --   --   PHOS 3.9 3.2  --   --   --  Sepsis Markers  Recent Labs Lab 01/30/14 2028 01/30/14 2350 01/31/14 0330 01/31/14 0410  LATICACIDVEN 0.7 0.8 0.7  --   PROCALCITON <0.10  --   --  0.11   ABG  Recent Labs Lab 01/30/14 1610 01/30/14 1946 01/30/14 2343  PHART 7.295* 7.358 7.390  PCO2ART 53.3* 47.9* 46.7*  PO2ART 437.0* 212.0* 221.0*   Liver Enzymes  Recent Labs Lab 01/30/14 1430 01/30/14 2028 01/31/14 0330 01/31/14 0410  AST 41* 42*  --  31  ALT 59* 59*  --  54*  ALKPHOS 526* 449*  --  438*  BILITOT 0.3 0.3  --  0.3  ALBUMIN 1.9* 1.7*  1.7* 1.7* 1.6*   Cardiac Enzymes  Recent Labs Lab 01/30/14 2028 01/31/14 0108 01/31/14 0630  TROPONINI <0.30 <0.30 <0.30  PROBNP 9165.0*  --   --    Glucose  Recent Labs Lab 01/31/14 2348 02/01/14 0406 02/01/14 0758 02/01/14 1147 02/01/14 1556 02/01/14 2131  GLUCAP 117* 104* 185* 155* 211* 235*   IMAGING: Nm Pulmonary Perf And  Vent  02/01/2014   CLINICAL DATA Shortness of breath, evaluate for pulmonary embolism  EXAM NUCLEAR MEDICINE VENTILATION - PERFUSION LUNG SCAN  TECHNIQUE Ventilation images were obtained in multiple projections using inhaled aerosol technetium 99 M DTPA. Perfusion images were obtained in multiple projections after intravenous injection of Tc-55m MAA.  RADIOPHARMACEUTICALS 40 mCi Tc-62m DTPA aerosol and 6 mCi Tc-58m MAA  COMPARISON NM PULMONARY VENT & PERF dated 01/09/2014; DG CHEST 1V PORT dated 01/30/2014; DG CHEST 1 VIEW dated 01/30/2014; DG CHEST 2 VIEW dated 01/26/2014  FINDINGS Review of chest radiograph performed 01/30/2014 demonstrates an enlarged cardiac silhouette and mediastinal contours. Stable position of support apparatus. No pneumothorax. External pacing pads overlie left ventricular apex. The pulmonary vasculature is indistinct with cephalization of flow. Grossly unchanged perihilar and bilateral medial basilar opacities. Unchanged small bilateral effusions. No definite pneumothorax.  Ventilation: Review the ventilatory images demonstrates clumping of inhaled radiotracer about the bilateral pulmonary hila, left greater than right. Inhaled radiotracer is seen layering within the left mainstem bronchus. There is overall poor ventilation of the bilateral lungs. A minimal amount of radiotracer is seen within the hypopharynx.  Perfusion: There is relative homogeneous distribution of injected radiotracer throughout the bilateral pulmonary parenchyma. There is minimal oligemia about the right major fissure and blunting of the left costophrenic angle, both of which are unchanged and likely secondary to the patient's known small bilateral pleural effusions. No discrete mismatched segmental or subsegmental filling defects.  IMPRESSION Pulmonary embolism absent (very low probability for pulmonary embolism).  SIGNATURE  Electronically Signed   By: Sandi Mariscal M.D.   On: 02/01/2014 16:09   ASSESSMENT /  PLAN:  PULMONARY A:   Acute respiratory failure in setting of cardiac arrest, resolved No evidence of PE P: Goal SpO2>92 Supplemental oxygen Incentive spirometry / flutter valve  CARDIOVASCULAR A:   Asystole / PEA arrest No evidence of ischemia Hypertension H/o DVT P:  Goal BP<160/90 Cardiology following Hold Coreg, Verapamil Hydralazine ( increased ), Imdur Hydralazine PRN Cardiac cath today  RENAL A:   AKI P: Trend BMP Avoid nephrotoxins  IVF pre cath  GASTROINTESTINAL A:   Abdominal distension / diarrhea in outside facility - resolved Mildly elevated liver enzymes, stable Nutrition GI Px is not indicated P:   NPO for cath  HEMATOLOGIC A:   Stable anemia, etiology? Therapeutic anticoagulation for Hx DVT Coumadin coagulopathy resolved VTE Px is not indicated  P:  Trend CBC / INR Heparin  gtt Restart Coumadin after cath  INFECTIOUS A:   UTI Doubt pseudomembranous colitis  Unlikely LLE wound infection P:   Monitor off abx  ENDOCRINE A:   DM II Hyperglycemia P:  SSI  NEUROLOGIC A:   Intact P:   PT OT  I have personally obtained history, examined patient, evaluated and interpreted laboratory and imaging results, reviewed medical records, formulated assessment / plan and placed orders.  Doree Fudge, MD Pulmonary and [place] Pager: [phone]  02/02/2014, 9:10 AM

## 2014-02-03 ENCOUNTER — Ambulatory Visit (HOSPITAL_COMMUNITY): Payer: PRIVATE HEALTH INSURANCE | Admitting: Speech Pathology

## 2014-02-03 LAB — PROTIME-INR
INR: 1.45 (ref 0.00–1.49)
Prothrombin Time: 17.3 seconds — ABNORMAL HIGH (ref 11.6–15.2)

## 2014-02-03 LAB — GLUCOSE, CAPILLARY
GLUCOSE-CAPILLARY: 119 mg/dL — AB (ref 70–99)
GLUCOSE-CAPILLARY: 184 mg/dL — AB (ref 70–99)
GLUCOSE-CAPILLARY: 261 mg/dL — AB (ref 70–99)
GLUCOSE-CAPILLARY: 218 mg/dL — AB (ref 70–99)

## 2014-02-03 LAB — CBC
HCT: 24.9 % — ABNORMAL LOW (ref 39.0–52.0)
Hemoglobin: 7.8 g/dL — ABNORMAL LOW (ref 13.0–17.0)
MCH: 24.4 pg — ABNORMAL LOW (ref 26.0–34.0)
MCHC: 31.3 g/dL (ref 30.0–36.0)
MCV: 77.8 fL — ABNORMAL LOW (ref 78.0–100.0)
Platelets: 376 10*3/uL (ref 150–400)
RBC: 3.2 MIL/uL — ABNORMAL LOW (ref 4.22–5.81)
RDW: 21.5 % — ABNORMAL HIGH (ref 11.5–15.5)
WBC: 8.4 10*3/uL (ref 4.0–10.5)

## 2014-02-03 MED ORDER — WARFARIN SODIUM 7.5 MG PO TABS
7.5000 mg | ORAL_TABLET | Freq: Once | ORAL | Status: AC
  Administered 2014-02-03: 7.5 mg via ORAL
  Filled 2014-02-03: qty 1

## 2014-02-03 MED ORDER — FUROSEMIDE 10 MG/ML IJ SOLN
40.0000 mg | Freq: Two times a day (BID) | INTRAMUSCULAR | Status: DC
  Administered 2014-02-03 – 2014-02-06 (×7): 40 mg via INTRAVENOUS
  Filled 2014-02-03 (×8): qty 4

## 2014-02-03 MED ORDER — WARFARIN - PHARMACIST DOSING INPATIENT
Freq: Every day | Status: DC
  Administered 2014-02-07 – 2014-02-11 (×3)

## 2014-02-03 MED ORDER — CLONIDINE HCL 0.1 MG PO TABS
0.1000 mg | ORAL_TABLET | Freq: Three times a day (TID) | ORAL | Status: DC
  Administered 2014-02-03 – 2014-02-14 (×33): 0.1 mg via ORAL
  Filled 2014-02-03 (×36): qty 1

## 2014-02-03 MED ORDER — ENOXAPARIN SODIUM 80 MG/0.8ML ~~LOC~~ SOLN
75.0000 mg | Freq: Two times a day (BID) | SUBCUTANEOUS | Status: DC
  Administered 2014-02-03 – 2014-02-11 (×16): 75 mg via SUBCUTANEOUS
  Filled 2014-02-03 (×20): qty 0.8

## 2014-02-03 NOTE — Evaluation (Signed)
Occupational Therapy Evaluation Patient Details Name: Joshua [name] MRN: [id number] DOB: 01-Feb-1951 Today's Date: 02/03/2014 Time: [id number] OT Time Calculation (min): 19 min  OT Assessment / Plan / Recommendation History of present illness 63 y.o. male admitted to [facility] on 01/06/14 with DM2, previous treatment (abbreviated) for extrapulmonary TB admitted with worseing foot ulcer with increasing pain, chills.  He had a foot ulcer plantar surface first metatarsal head left foot.  His hospital course was complicated by bradycardia, hypotension, seizure in the ICU.  The pt was intubated 01/07/14-01/08/14.  He was found to have bil peroneal DVTs, bil pleural effusions on chest CT (neg for PE), abdominal US revealed acute acalculous cholecystitis, and MRI of the foot showed no osteo, diffuse cellulitis, myositis, small 1st MTP joint effusion that is likely aseptic and incidental, osteonecrosis of first MTP joint.  Ortho preformed a bedside aspiration of the wound on 01/11/14.  Ortho recommends WBAT in post-op shoe.  S/p I&D L foot 12/17/13.     Clinical Impression   Pt admitted with above. He demonstrates the below listed deficits and will benefit from continued OT to maximize safety and independence with BADLs.  Pt is very self limiting with his behaviors, and demonstrates ability to do more than he is currently doing.  He keeps stating "I can't" when asked.   Pt with planned return to SNF for rehab.  Feel all further OT needs can be addressed at SNF.      OT Assessment  All further OT needs can be met in the next venue of care    Follow Up Recommendations  SNF;Supervision/Assistance - 24 hour    Barriers to Discharge      Equipment Recommendations  None recommended by OT    Recommendations for Other Services    Frequency       Precautions / Restrictions Precautions Precautions: Fall;Other (comment) Precaution Comments: weak, self limiting and desats with activity Required Braces or Orthoses:  Other Brace/Splint Other Brace/Splint: post op shoe, left foot Restrictions Weight Bearing Restrictions: No LLE Weight Bearing: Weight bearing as tolerated   Pertinent Vitals/Pain     ADL  Eating/Feeding: Modified independent Where Assessed - Eating/Feeding: Chair Grooming: Wash/dry hands;Wash/dry face;Teeth care;Brushing hair;Set up Where Assessed - Grooming: Supported sitting Upper Body Bathing: Minimal assistance Where Assessed - Upper Body Bathing: Unsupported sitting Lower Body Bathing: Maximal assistance Where Assessed - Lower Body Bathing: Supported sit to stand Upper Body Dressing: Minimal assistance Where Assessed - Upper Body Dressing: Unsupported sitting Lower Body Dressing: +1 Total assistance Where Assessed - Lower Body Dressing: Supported sit to Lobbyist: Minimal assistance (+2) Toilet Transfer Method: Sit to stand;Stand pivot Science writer: Bedside commode Toileting - Clothing Manipulation and Hygiene: +1 Total assistance Where Assessed - Toileting Clothing Manipulation and Hygiene: Sit to stand from 3-in-1 or toilet Transfers/Ambulation Related to ADLs: Pt ambulated ~5' with min a +2  ADL Comments: Pt would not attempt to perform any basic ADLs  Had tried to see pt x > 35 mins, but each attempt pt still eating lunch (RN and CNA had also checked on pt during this time).   Once pt finished with lunch he reports that he can't work with OT because he was wet.  Pt incontinent of urine with linens saturated and puddle on the floor.  Pt made no attempts to alert staff to this and reports he had been wet at least 2 hours.  Explained to pt why it is importand to call for help.  He states "I don't want to bother anybody".   Pt made no attempts to assist with clean up stating "I can't    OT Diagnosis: Generalized weakness  OT Problem List: Decreased strength;Decreased activity tolerance;Impaired balance (sitting and/or standing);Decreased cognition;Decreased  safety awareness;Decreased knowledge of use of DME or AE;Cardiopulmonary status limiting activity OT Treatment Interventions: Self-care/ADL training;Therapeutic exercise;DME and/or AE instruction;Therapeutic activities;Cognitive remediation/compensation;Patient/family education;Balance training   OT Goals(Current goals can be found in the care plan section)    Visit Information  Last OT Received On: 02/03/14 Assistance Needed: +2 History of Present Illness: 63 y.o. male admitted to [facility] on 01/06/14 with DM2, previous treatment (abbreviated) for extrapulmonary TB admitted with worseing foot ulcer with increasing pain, chills.  He had a foot ulcer plantar surface first metatarsal head left foot.  His hospital course was complicated by bradycardia, hypotension, seizure in the ICU.  The pt was intubated 01/07/14-01/08/14.  He was found to have bil peroneal DVTs, bil pleural effusions on chest CT (neg for PE), abdominal US revealed acute acalculous cholecystitis, and MRI of the foot showed no osteo, diffuse cellulitis, myositis, small 1st MTP joint effusion that is likely aseptic and incidental, osteonecrosis of first MTP joint.  Ortho preformed a bedside aspiration of the wound on 01/11/14.  Ortho recommends WBAT in post-op shoe.  S/p I&D L foot 12/17/13.         Prior [place] expects to be discharged to:: [facility]: [name] - single point Additional Comments: pt lived alone PTA in january Prior Function Level of Independence: Needs assistance Gait / Transfers Assistance Needed: mod assist with transfers stating he hasn't walked since hospital DC ADL's / Homemaking Assistance Needed: Unable to determine level of assist required PTA as pt reports he has been unable to do anything  Communication Communication: No difficulties Dominant Hand: Right         Vision/Perception     Cognition  Cognition Arousal/Alertness:  Awake/alert Behavior During Therapy: Anxious Overall Cognitive Status: Impaired/Different from baseline Area of Impairment: Safety/judgement;Problem solving Memory: Decreased short-term memory Safety/Judgement: Decreased awareness of deficits Problem Solving: Difficulty sequencing General Comments: Pt unable to provide accurate info re: PLOF.  He requires encouragement and step by step cues for all activities.  He sat in urine for ~2 hours in the chair without alerting the nurse    Extremity/Trunk Assessment Upper Extremity Assessment Upper Extremity Assessment: Generalized weakness Lower Extremity Assessment Lower Extremity Assessment: Defer to PT evaluation Cervical / Trunk Assessment Cervical / Trunk Assessment: Normal     Mobility Transfers Overall transfer level: Needs assistance Equipment used: 2 person hand held assist Transfers: Sit to/from Stand;Stand Pivot Transfers Sit to Stand: Min assist;+2 safety/equipment Stand pivot transfers: Min assist;+2 safety/equipment General transfer comment: Step by step cues and max encouragement     Exercise     Balance Balance Overall balance assessment: Needs assistance Sitting-balance support: Bilateral upper extremity supported Sitting balance-Leahy Scale: Fair Standing balance support: Bilateral upper extremity supported Standing balance-Leahy Scale: Poor   End of Session OT - End of Session Equipment Utilized During Treatment: Oxygen Activity Tolerance: Other (comment) (By self limiting behaviors) Patient left: in chair;with call bell/phone within reach;with nursing/sitter in room Nurse Communication: Mobility status  GO     Lucille Passy M 02/03/2014, 2:56 PM

## 2014-02-03 NOTE — Evaluation (Signed)
Physical Therapy Evaluation Patient Details Name: Joshua [name] MRN: [id number] DOB: [birth year] Today's Date: 02/03/2014 Time: [id number] PT Time Calculation (min): 23 min  PT Assessment / Plan / Recommendation History of Present Illness  63 y.o. male admitted to [facility] on 01/06/14 with DM2, previous treatment (abbreviated) for extrapulmonary TB admitted with worseing foot ulcer with increasing pain, chills.  He had a foot ulcer plantar surface first metatarsal head left foot.  His hospital course was complicated by bradycardia, hypotension, seizure in the ICU.  The pt was intubated 01/07/14-01/08/14.  He was found to have bil peroneal DVTs, bil pleural effusions on chest CT (neg for PE), abdominal US revealed acute acalculous cholecystitis, and MRI of the foot showed no osteo, diffuse cellulitis, myositis, small 1st MTP joint effusion that is likely aseptic and incidental, osteonecrosis of first MTP joint.  Ortho preformed a bedside aspiration of the wound on 01/11/14.  Ortho recommends WBAT in post-op shoe.  S/p I&D L foot 12/17/13.    Clinical Impression  Pt very self-limiting with statements of wanting to die with RN and MD aware. Pt with generalized weakness and decreased function who will need ST-SNF to be able to return home alone. Pt will benefit from acute therapy to maximize mobility, function, strength and gait. Pt benefits from 2 person assist with gait due to self-limiting behavior and fatigue with chair to follow. Will follow acutely and recommend OOB daily with nursing.      PT Assessment  Patient needs continued PT services    Follow Up Recommendations  SNF;Supervision/Assistance - 24 hour    Does the patient have the potential to tolerate intense rehabilitation      Barriers to Discharge Decreased caregiver support      Equipment Recommendations  Rolling walker with 5" wheels    Recommendations for Other Services OT consult   Frequency Min 3X/week    Precautions /  Restrictions Precautions Precautions: Fall;Other (comment) Precaution Comments: weak, self limiting and desats with activity   Pertinent Vitals/Pain No pain sats 80-96% on 5L with max cueing for breathing technique HR 107      Mobility  Bed Mobility Overal bed mobility: Needs Assistance Bed Mobility: Rolling;Sidelying to Sit Rolling: Min assist Sidelying to sit: Min assist General bed mobility comments: cues for sequence, safety and assist to elevate trunk from surface Transfers Overall transfer level: Needs assistance Equipment used: Rolling walker (2 wheeled) Transfers: Sit to/from [employer] Sit to Stand: Mod assist Stand pivot transfers: Mod assist General transfer comment: max cues for hand placement, positioning feet prior to transfers and assist for anterior translation and elevation from surface.  Pt pivoted bed to chair then ambulated. Ambulation/Gait Ambulation/Gait assistance: Mod assist;+2 safety/equipment Ambulation Distance (Feet): 8 Feet Assistive device: Rolling walker (2 wheeled) Gait Pattern/deviations: Step-to pattern;Decreased step length - left;Decreased step length - right Gait velocity interpretation: <1.8 ft/sec, indicative of risk for recurrent falls General Gait Details: Pt with max encouragement and assist to initiate gait as well as advance RW and assist with weight shift. Limited by fatigue and pt constantly stating he can't walk even as taking as step    Exercises General Exercises - Lower Extremity Heel Slides: AAROM;Both;5 reps   PT Diagnosis: Difficulty walking;Acute pain;Altered mental status;Generalized weakness  PT Problem List: Decreased strength;Decreased activity tolerance;Decreased balance;Decreased mobility;Decreased knowledge of use of DME;Impaired sensation;Decreased skin integrity;Decreased cognition;Decreased range of motion;Cardiopulmonary status limiting activity PT Treatment Interventions: DME instruction;Gait  training;Stair training;Functional mobility training;Therapeutic activities;Therapeutic exercise;Balance training;Neuromuscular re-education;Patient/family education;Cognitive remediation  PT Goals(Current goals can be found in the care plan section) Acute Rehab PT Goals Patient Stated Goal: pt agreeable to wanting to walk and be able to eventually return home PT Goal Formulation: With patient Time For Goal Achievement: 02/17/14 Potential to Achieve Goals: Good  Visit Information  Last PT Received On: 02/03/14 Assistance Needed: +2 (safety with gait) History of Present Illness: 63 y.o. male admitted to [facility] on 01/06/14 with DM2, previous treatment (abbreviated) for extrapulmonary TB admitted with worseing foot ulcer with increasing pain, chills.  He had a foot ulcer plantar surface first metatarsal head left foot.  His hospital course was complicated by bradycardia, hypotension, seizure in the ICU.  The pt was intubated 01/07/14-01/08/14.  He was found to have bil peroneal DVTs, bil pleural effusions on chest CT (neg for PE), abdominal US revealed acute acalculous cholecystitis, and MRI of the foot showed no osteo, diffuse cellulitis, myositis, small 1st MTP joint effusion that is likely aseptic and incidental, osteonecrosis of first MTP joint.  Ortho preformed a bedside aspiration of the wound on 01/11/14.  Ortho recommends WBAT in post-op shoe.  S/p I&D L foot 12/17/13.         Prior [place] expects to be discharged to:: Skilled nursing facility Living Arrangements: Alone Available Help at Discharge: Available PRN/intermittently Type of Home: House Home Access: Level entry Home Layout: Two level;Bed/bath upstairs Alternate Level Stairs-Number of Steps: 13 Home Equipment: Cane - single point Additional Comments: pt lived alone PTA in january Prior Function Level of Independence: Needs assistance Gait / Transfers Assistance Needed: mod assist with transfers  stating he hasn't walked since hospital DC Communication Communication: No difficulties    Cognition  Cognition Arousal/Alertness: Awake/alert Behavior During Therapy: Anxious Overall Cognitive Status: Impaired/Different from baseline Area of Impairment: Safety/judgement;Problem solving Memory: Decreased short-term memory Safety/Judgement: Decreased awareness of deficits Problem Solving: Difficulty sequencing General Comments: Pt continually stating "I can't", "I want to die", "just kill me". Even after walking pt stated he didn't walk    Extremity/Trunk Assessment Upper Extremity Assessment Upper Extremity Assessment: Generalized weakness Lower Extremity Assessment Lower Extremity Assessment: Generalized weakness RLE Deficits / Details: 2+/5 bil LE with question of max participation of pt RLE Sensation: decreased light touch LLE Deficits / Details: 2+/5 bil LE with question of max participation of pt LLE Sensation: decreased light touch Cervical / Trunk Assessment Cervical / Trunk Assessment: Normal   Balance Balance Overall balance assessment: Needs assistance Sitting balance-Leahy Scale: Fair Standing balance-Leahy Scale: Poor  End of Session PT - End of Session Equipment Utilized During Treatment: Gait belt;Oxygen Activity Tolerance: Patient limited by fatigue Patient left: in chair;with call bell/phone within reach;with nursing/sitter in room Nurse Communication: Mobility status;Precautions;Other (comment) (pt comments regarding wanting to die)  GP     Lanetta Inch Beth 02/03/2014, [place], [place]

## 2014-02-03 NOTE — Progress Notes (Signed)
[facility]UL[facility]  / CRITICAL CARE MEDICINE  Name: Joshua [name] MRN: [id number] DOB: [birth year]    ADMISSION DATE:  01/30/2014 CONSULTATION DATE:  01/30/2014  REFERRING MD :  [facility] PRIMARY SERVICE: PCCM  CHIEF COMPLAINT:  Asystole / PEA arrest  BRIEF PATIENT DESCRIPTION: 63 yo who was undergoing KUB due to diarrhea and abdominal distention when he developed asystole / PEA arrest with ROSC after 14 minutes.  Transferred to [facility] in consideration for hypothermia.  Neurologically intact on arrival.  Hypothermia protocol aborted.  SIGNIFICANT EVENTS / STUDIES:  3/8  Head CT >>> mild non obstructive CAD 3/8  Abdomen CT >>> Bowel edema, small amount of ascites, small pleural effusions, bibasilar airspace disase 3/9  US abdomen >>> No definitive findings suggesting cholecystitis 3/9  TTE >>> EF 60, mildly;y dilated RV with mildly reduced systolic function 3/9  Venous Doppler LE BL >>> No DVT 3/10 VQ scan >>> No PE 3/11 Cardiac cath >>> nad  LINES / TUBES: R IJ TLC 3/8 >>> 3/12 L Rad AL 3/8 >>> 3/10 OETT 3/8 >>> 3/8 OGT  3/8 >>> 3/9 Foley 3/8 >>> 3/9  CULTURES: 3/8  MRSA PCR >>> neg 3/8  Blood >>>  ANTIBIOTICS: Levaquin 3/8 >>> 3/9 Zosyn 3/9 >>> 3/11 Vancomycin 3/9 >>> 3/10  INTERVAL HISTORY:  Increased WOB overnight, improved with Lasix.  Reports being tired.  Concerns about being a burden for his family.  Denies being suicidal.  VITAL SIGNS: Temp:  [98 F (36.7 C)-99.4 F (37.4 C)] 99 F (37.2 C) (03/12 0736) Pulse Rate:  [65-109] 107 (03/12 0901) Cardiac Rhythm:  [-] Normal sinus rhythm (03/12 0400) Resp:  [0-24] 0 (03/12 0736) BP: (139-188)/(68-100) 177/88 mmHg (03/12 0901) SpO2:  [80 %-98 %] 85 % (03/12 0901) FiO2 (%):  [45 %] 45 % (03/12 0400) Weight:  [75.3 kg (166 lb 0.1 oz)] 75.3 kg (166 lb 0.1 oz) (03/12 0500)  HEMODYNAMICS:   VENTILATOR SETTINGS: Vent Mode:  [-]  FiO2 (%):  [45 %] 45 %  INTAKE / OUTPUT: Intake/Output     03/11 0701 - 03/12 0700 03/12 0701 -  03/13 0700   P.O. 240    I.V. (mL/kg) 699.7 (9.3)    IV Piggyback     Total Intake(mL/kg) 939.7 (12.5)    Urine (mL/kg/hr) 2510 (1.4)    Total Output 2510     Net -1570.3          Stool Occurrence 1 x         PHYSICAL EXAMINATION: General:  Resting in no distress Neuro:  Awake, alert HEENT:  PERRL, no JVD Cardiovascular:  Regular, tachycardic Lungs:  CTAB Abdomen:  Soft, nontender, bowel sounds normal Musculoskeletal:  L foot wounds dressed Skin:  Intact  LABS: CBC  Recent Labs Lab 02/02/14 0440 02/02/14 1228 02/03/14 0420  WBC 9.9 9.0 8.4  HGB 8.3* 8.2* 7.8*  HCT 26.7* 26.1* 24.9*  PLT 436* 394 376   Coag's  Recent Labs Lab 01/31/14 0410 02/01/14 0400 02/01/14 2000 02/02/14 0440 02/03/14 0420  APTT 48* 52*  --  42*  --   INR 5.00* 2.64* 2.12* 1.64* 1.45   BMET  Recent Labs Lab 01/31/14 0410 02/01/14 0400 02/02/14 0440 02/02/14 1228  NA 144 145 140  --   K 3.9 4.2 4.2  --   CL 107 108 103  --   CO2 26 25 26   --   BUN 53* 43* 44*  --   CREATININE 1.57* 1.59* 1.68* 1.54*  GLUCOSE 88  113* 160*  --    Electrolytes  Recent Labs Lab 01/30/14 2028 01/31/14 0330 01/31/14 0410 02/01/14 0400 02/02/14 0440  CALCIUM 8.9 8.8 8.8 8.7 8.5  MG 2.1  --   --   --   --   PHOS 3.9 3.2  --   --   --    Sepsis Markers  Recent Labs Lab 01/30/14 2028 01/30/14 2350 01/31/14 0330 01/31/14 0410  LATICACIDVEN 0.7 0.8 0.7  --   PROCALCITON <0.10  --   --  0.11   ABG  Recent Labs Lab 01/30/14 1610 01/30/14 1946 01/30/14 2343  PHART 7.295* 7.358 7.390  PCO2ART 53.3* 47.9* 46.7*  PO2ART 437.0* 212.0* 221.0*   Liver Enzymes  Recent Labs Lab 01/30/14 1430 01/30/14 2028 01/31/14 0330 01/31/14 0410  AST 41* 42*  --  31  ALT 59* 59*  --  54*  ALKPHOS 526* 449*  --  438*  BILITOT 0.3 0.3  --  0.3  ALBUMIN 1.9* 1.7*  1.7* 1.7* 1.6*   Cardiac Enzymes  Recent Labs Lab 01/30/14 2028 01/31/14 0108 01/31/14 0630  TROPONINI <0.30 <0.30 <0.30   PROBNP 9165.0*  --   --    Glucose  Recent Labs Lab 02/01/14 2131 02/02/14 0736 02/02/14 1110 02/02/14 1602 02/02/14 2202 02/03/14 0735  GLUCAP 235* 163* 150* 172* 134* 119*   IMAGING: Nm Pulmonary Perf And Vent  02/01/2014   CLINICAL DATA Shortness of breath, evaluate for pulmonary embolism  EXAM NUCLEAR MEDICINE VENTILATION - PERFUSION LUNG SCAN  TECHNIQUE Ventilation images were obtained in multiple projections using inhaled aerosol technetium 99 M DTPA. Perfusion images were obtained in multiple projections after intravenous injection of Tc-47m MAA.  RADIOPHARMACEUTICALS 40 mCi Tc-105m DTPA aerosol and 6 mCi Tc-53m MAA  COMPARISON NM PULMONARY VENT & PERF dated 01/09/2014; DG CHEST 1V PORT dated 01/30/2014; DG CHEST 1 VIEW dated 01/30/2014; DG CHEST 2 VIEW dated 01/26/2014  FINDINGS Review of chest radiograph performed 01/30/2014 demonstrates an enlarged cardiac silhouette and mediastinal contours. Stable position of support apparatus. No pneumothorax. External pacing pads overlie left ventricular apex. The pulmonary vasculature is indistinct with cephalization of flow. Grossly unchanged perihilar and bilateral medial basilar opacities. Unchanged small bilateral effusions. No definite pneumothorax.  Ventilation: Review the ventilatory images demonstrates clumping of inhaled radiotracer about the bilateral pulmonary hila, left greater than right. Inhaled radiotracer is seen layering within the left mainstem bronchus. There is overall poor ventilation of the bilateral lungs. A minimal amount of radiotracer is seen within the hypopharynx.  Perfusion: There is relative homogeneous distribution of injected radiotracer throughout the bilateral pulmonary parenchyma. There is minimal oligemia about the right major fissure and blunting of the left costophrenic angle, both of which are unchanged and likely secondary to the patient's known small bilateral pleural effusions. No discrete mismatched segmental or  subsegmental filling defects.  IMPRESSION Pulmonary embolism absent (very low probability for pulmonary embolism).  SIGNATURE  Electronically Signed   By: Sandi Mariscal M.D.   On: 02/01/2014 16:09   Dg Chest Port 1 View  02/02/2014   CLINICAL DATA Dyspnea  EXAM PORTABLE CHEST - 1 VIEW  COMPARISON 01/30/2014  FINDINGS Cardiomediastinal contours are mildly prominent. Central vascular congestion. Interstitial prominence and patchy bibasilar opacities. Small effusions not excluded. No pneumothorax. Multilevel degenerative change. Multiple wires/tubes are presumably external.  IMPRESSION Hypoaeration. Central vascular congestion and interstitial and hazy airspace opacities may reflect edema (favored) versus multifocal infection.  Small effusions and bibasilar opacities, favor atelectasis.  SIGNATURE  Electronically Signed   By: Carlos Levering M.D.   On: 02/02/2014 22:59   ASSESSMENT / PLAN:  PULMONARY A:   Acute respiratory failure in setting of cardiac arrest, resolved No evidence of PE P: Goal SpO2>92 Supplemental oxygen Incentive spirometry / flutter valve  CARDIOVASCULAR A:   Asystole / PEA arrest, no clear etiology ( no PE, now CAD ) AV node dysfunction  Hypertension H/o DVT P:  Goal BP<160/90 Cardiology following Avoid rate limiting agents - Coreg, Verapamil d/c'd Hydralazine ( increased ), Imdur, Norvasc - maximized Add Clonidine 0.1 mg TID Hydralazine PRN  RENAL A:   AKI P: Trend BMP Avoid nephrotoxins  Lasix 40 IV bid x 48  GASTROINTESTINAL A:   Abdominal distension / diarrhea in outside facility - resolved Mildly elevated liver enzymes, stable - cholestasis? Nutrition GI Px is not indicated P:   Diet  HEMATOLOGIC A:   Stable anemia, etiology? Therapeutic anticoagulation for Hx DVT Coumadin coagulopathy resolved VTE Px is not indicated  P:  Trend CBC / INR Heparin gtt Resume Coumadin per pharmacy SCD  INFECTIOUS A:   UTI Doubt pseudomembranous colitis   Unlikely LLE wound infection P:   Monitor off abx  ENDOCRINE A:   DM II Hyperglycemia P:  SSI  NEUROLOGIC A:   Depression? P:   PT / OT Start Prozac 20 daily  Transfer to telemetry 3/12.  PCCM will sign off.  Cardiology will sign off.  TRH to assume care 3/13.  I have personally obtained history, examined patient, evaluated and interpreted laboratory and imaging results, reviewed medical records, formulated assessment / plan and placed orders.  Doree Fudge, MD Pulmonary and [place] Pager: [phone]  02/03/2014, 9:15 AM

## 2014-02-03 NOTE — Progress Notes (Signed)
ANTICOAGULATION CONSULT NOTE - Initial Consult  Pharmacy Consult for Warfarin/Lovenox Indication: VTE Treatment  Allergies  Allergen Reactions  . Aspirin Itching    Patient Measurements: Height: 5\' 6"  (167.6 cm) Weight: 166 lb 0.1 oz (75.3 kg) IBW/kg (Calculated) : 63.8  Vital Signs: Temp: 99 F (37.2 C) (03/12 0736) Temp src: Oral (03/12 0736) BP: 127/86 mmHg (03/12 1300) Pulse Rate: 101 (03/12 1300)  Labs:  Recent Labs  02/01/14 0400 02/01/14 2000 02/02/14 0440 02/02/14 1228 02/03/14 0420  HGB 7.9*  --  8.3* 8.2* 7.8*  HCT 25.2*  --  26.7* 26.1* 24.9*  PLT 392  --  436* 394 376  APTT 52*  --  42*  --   --   LABPROT 27.3* 23.1* 19.0*  --  17.3*  INR 2.64* 2.12* 1.64*  --  1.45  CREATININE 1.59*  --  1.68* 1.54*  --     Estimated Creatinine Clearance: 44.9 ml/min (by C-G formula based on Cr of 1.54).   Medical History: Past Medical History  Diagnosis Date  . 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 Hypertension 10/14/2012  . Type 2 diabetes mellitus   . Diabetic nephropathy with proteinuria   . 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 Diastolic heart failure   . Cellulitis   . Convulsions   . Iron deficiency anemia   . Venous thrombosis and embolism   . Anasarca   . Hypoalbuminemia   . Elevated alkaline phosphatase level   . Microcytic anemia   . GI bleed   . Nephrotic syndrome     Medications:  Scheduled:  . amLODipine  10 mg Oral Daily  . cloNIDine  0.1 mg Oral TID  . enoxaparin (LOVENOX) injection  75 mg Subcutaneous Q12H  . furosemide  40 mg Intravenous Q12H  . hydrALAZINE  50 mg Oral 3 times per day  . insulin aspart  0-15 Units Subcutaneous TID WC  . isosorbide mononitrate  30 mg Oral Daily  . warfarin  7.5 mg Oral ONCE-1800  . Warfarin - Pharmacist Dosing Inpatient   Does not apply q1800    Assessment: 63 yo male admitted on 3/8 after cardiac arrest during X-ray for abdominal pain and distention. Pharmacy has been  consulted to restart warfarin with a lovenox bridge for VTE treatment since the patient had a recent DVT. Patient was on PTA warfarin for previous DVT on 01/07/2014 and admitted with a supratherapeutic INR. Two doses of vitamin K were given to decrease the INR. There was concern for acute PE in recent setting of DVT, PEA, and elevated D-dimer, but this was ruled out after negative VQ scan and bilateral duplex.The bridge should be continued for a minimum of 5 days and two consecutive therapeutic INRs obtained. Hg 7.8 (stable), platelets 376. No s/s of bleeding reported.   Goal of Therapy:  INR 2-3 Monitor platelets by anticoagulation protocol: Yes   Plan:  - Warfarin 7.5mg  x 1 today - Lovenox 75mg  IV q12h  - Monitor CBC and s/s bleeding  Roderic Palau A. Pincus Badder, PharmD Clinical Pharmacist - Resident Pager: [phone] Pharmacy: [phone] 02/03/2014 2:52 PM

## 2014-02-03 NOTE — Progress Notes (Signed)
SUBJECTIVE:   No complaints  OBJECTIVE:   Vitals:   Filed Vitals:   02/03/14 0736 02/03/14 0800 02/03/14 0900 02/03/14 0901  BP: 175/91 177/88 180/87 177/88  Pulse: 103 108 115 107  Temp: 99 F (37.2 C)     TempSrc: Oral     Resp: 0 17 10   Height:      Weight:      SpO2: 88% 91% 95% 85%   I&O's:   Intake/Output Summary (Last 24 hours) at 02/03/14 1013 Last data filed at 02/03/14 0900  Gross per 24 hour  Intake 681.67 ml  Output   2360 ml  Net -1678.33 ml   TELEMETRY: Reviewed telemetry pt in NSR:     PHYSICAL EXAM General: Well developed, well nourished, in no acute distress Head: Eyes PERRLA, No xanthomas.   Normal cephalic and atramatic  Lungs:   Clear bilaterally to auscultation and percussion. Heart:   HRRR S1 S2 Pulses are 2+ & equal. Abdomen: Bowel sounds are positive, abdomen soft and non-tender without masses Extremities:   No clubbing, cyanosis or edema.  DP +1 Neuro: Alert and oriented X 3. Psych:  Good affect, responds appropriately   LABS: Basic Metabolic Panel:  Recent Labs  02/01/14 0400 02/02/14 0440 02/02/14 1228  NA 145 140  --   K 4.2 4.2  --   CL 108 103  --   CO2 25 26  --   GLUCOSE 113* 160*  --   BUN 43* 44*  --   CREATININE 1.59* 1.68* 1.54*  CALCIUM 8.7 8.5  --    Liver Function Tests: No results found for this basename: AST, ALT, ALKPHOS, BILITOT, PROT, ALBUMIN,  in the last 72 hours No results found for this basename: LIPASE, AMYLASE,  in the last 72 hours CBC:  Recent Labs  02/02/14 1228 02/03/14 0420  WBC 9.0 8.4  HGB 8.2* 7.8*  HCT 26.1* 24.9*  MCV 78.4 77.8*  PLT 394 376   Cardiac Enzymes: No results found for this basename: CKTOTAL, CKMB, CKMBINDEX, TROPONINI,  in the last 72 hours BNP: No components found with this basename: POCBNP,  D-Dimer:  Recent Labs  02/01/14 0400  DDIMER 1.76*   Hemoglobin A1C: No results found for this basename: HGBA1C,  in the last 72 hours Fasting Lipid Panel: No results  found for this basename: CHOL, HDL, LDLCALC, TRIG, CHOLHDL, LDLDIRECT,  in the last 72 hours Thyroid Function Tests: No results found for this basename: TSH, T4TOTAL, FREET3, T3FREE, THYROIDAB,  in the last 72 hours Anemia Panel: No results found for this basename: VITAMINB12, FOLATE, FERRITIN, TIBC, IRON, RETICCTPCT,  in the last 72 hours Coag Panel:   Lab Results  Component Value Date   INR 1.45 02/03/2014   INR 1.64* 02/02/2014   INR 2.12* 02/01/2014    RADIOLOGY: Ct Abdomen Pelvis Wo Contrast  01/30/2014   CLINICAL DATA:  Cardiac arrest.  EXAM: CT CHEST, ABDOMEN AND PELVIS WITHOUT CONTRAST  TECHNIQUE: Multidetector CT imaging of the chest, abdomen and pelvis was performed following the standard protocol without IV contrast.  COMPARISON:  None.  CT CHEST W/O CM dated 01/07/2014; DG CHEST 1 VIEW dated 01/30/2014  FINDINGS: CT CHEST FINDINGS  The endotracheal tube extends into the right mainstem bronchus. This was immediately called to Dr. Dina Rich on 01/30/2014 at 3 p.m. Nasogastric tube extends into the abdomen. There appears to be prominent mediastinal lymph nodes which are similar to the previous examination but poorly characterized due to the lack  of contrast and motion. There are small bilateral pleural effusions, left side greater than right. No significant pericardial fluid. There is diffuse subcutaneous edema. There is compressive atelectasis in lower lobes bilaterally. Patchy densities in left upper lung. Airspace densities within the right middle lobe. There are subtle densities in the right upper lung. No evidence for a large pneumothorax. No acute bone abnormality.  CT ABDOMEN AND PELVIS FINDINGS  There is no evidence for free air. Nasogastric tube extends into the distal stomach. There is diffuse subcutaneous edema. There is a small amount of abdominal ascites. Unenhanced CT was performed per clinician order. Lack of IV contrast limits sensitivity and specificity, especially for evaluation of  abdominal/pelvic solid viscera. Gallbladder may be mildly distended but poorly characterized. No gross abnormality to the liver or spleen. Limited evaluation of the pancreas and adrenal glands. No gross abnormality to the kidneys. There are prominent small bowel loops in the left abdomen with mesenteric edema. Bowel wall thickening, particularly on sequence 2, image 86. Fluid in the urinary bladder. No acute bone abnormality.  IMPRESSION: The study is very limited due to the lack of contrast and motion.  Prominent small bowel loops in the left abdomen with mesenteric edema. There is concern for bowel wall thickening in this area. Based on the history of cardiac arrest, ischemic bowel is in the differential diagnosis. Findings could also be related to an infectious enteritis.  Bilateral small pleural effusions with patchy parenchymal lung densities. Findings could represent atelectasis and asymmetric edema. Infection cannot excluded, particularly in the right middle lobe.  Endotracheal tube in the right mainstem bronchus as described.  Small amount of abdominal ascites and diffuse subcutaneous edema. Findings are concerning for anasarca.  Mild distention of the gallbladder.   Electronically Signed   By: Markus Daft M.D.   On: 01/30/2014 15:18   Dg Chest 1 View  01/30/2014   CLINICAL DATA:  Status post cardiac arrest ; assess endotracheal tube and nasogastric tube position  EXAM: CHEST - 1 VIEW  COMPARISON:  CT CHEST W/O CM dated 01/30/2014  FINDINGS: The trachea has undergone interval intubation. The tip of the ET tube lies in the proximal right mainstem bronchus. The lung volumes are reasonably well maintained. There are coarse interstitial markings bilaterally with near confluence at the right lung base.  The esophagogastric tube tip and proximal port project below the inferior margin of the film. The cardiac silhouette is mildly enlarged. The pulmonary vascularity is not engorged. External pacemaker pads are  present.  IMPRESSION: 1. The endotracheal tube tip is at the margin of the right main stem bronchus and withdrawal by 5 cm is recommended. 2. The esophagogastric tube appears to be in appropriate position. 3. The pulmonary interstitial markings are mildly increased bilaterally especially on the right. Confluent density at the right lung base suggests atelectasis or pneumonia. 4. These results were called by telephone at the time of interpretation on 01/30/2014 at 2:55 PM to Doylene Canard, RN,, who verbally acknowledged these results.   Electronically Signed   By: David  Martinique   On: 01/30/2014 14:56   Dg Chest 2 View  01/26/2014   CLINICAL DATA:  Shortness of breath, lower extremity edema  EXAM: CHEST  2 VIEW  COMPARISON:  01/20/2014  FINDINGS: Cardiomegaly again noted. Bilateral small pleural effusion with bilateral basilar atelectasis or infiltrate. No pulmonary edema. Mild degenerative changes thoracic spine.  IMPRESSION: Cardiomegaly. Bilateral small pleural effusion with bilateral basilar atelectasis or infiltrate.   Electronically Signed  By: Lahoma Crocker M.D.   On: 01/26/2014 11:35   Dg Chest 2 View  01/20/2014   CLINICAL DATA:  Followup right lung volume loss. Shortness of breath.  EXAM: CHEST  2 VIEW  COMPARISON:  01/08/2014 and 01/07/2014  FINDINGS: Interval removal of nasogastric tube, right subclavian central venous catheter and endotracheal tubes. Lungs are hypoinflated with continued evidence of a small left pleural effusion likely with associated atelectasis in the left base. There is slightly better aeration in the left base compared to the previous exam. There is slight worsening of a small right pleural effusion likely with associated atelectasis. Stable cardiomegaly. Remainder the exam is unchanged.  IMPRESSION: Mild worsening of a small right pleural effusion likely with associated right basilar atelectasis. Persistent small left pleural effusion without significant change. Persistent  opacification in the left base with slightly better aeration which may be due to atelectasis or infection.   Electronically Signed   By: Marin Olp M.D.   On: 01/20/2014 12:18   Dg Chest 2 View  01/05/2014   CLINICAL DATA:  Reflux and chest congestion after eating.  EXAM: CHEST  2 VIEW  COMPARISON:  03/30/2013  FINDINGS: There is volume loss at the right lung base compared to the previous examination. Difficult to exclude bronchiectasis at the right lung base. There is no evidence for airspace disease within the right lung. Prominent right hilar structures are probably related to vascular crowding but this has clearly progressed since 08/31/2010. There are chronic pleural-based densities at the left lung base which could be related to volume loss and pleural thickening. Upper lungs are clear. Heart size is stable and within normal limits. The trachea is midline.  IMPRESSION: Chronic left basilar densities are suggestive for pleural-based disease. Findings could be related to a loculated effusion or pleural thickening.  Volume loss and probable vascular crowding at the right lung base. Recommend follow-up to ensure improved aeration at the right lung base and follow the right hilar densities. Alternately, consider further evaluation with a chest CT which could also evaluate the left pleural-based disease.   Electronically Signed   By: Markus Daft M.D.   On: 01/05/2014 17:41   Dg Abd 1 View  01/30/2014   CLINICAL DATA:  Abdominal tenderness and loose stools.  EXAM: ABDOMEN - 1 VIEW  COMPARISON:  DG ABD PORTABLE 1V dated 01/07/2014  FINDINGS: The bowel gas pattern is normal. No obstruction or significant ileus is visualized. No abnormal calcifications or soft tissue abnormalities are identified. Mild degenerative changes are present of the lower lumbar spine.  IMPRESSION: Unremarkable abdominal radiograph.   Electronically Signed   By: Aletta Edouard M.D.   On: 01/30/2014 14:24   Ct Head Wo  Contrast  01/30/2014   CLINICAL DATA:  Status post cardiac arrest, query intracranial hemorrhage.  EXAM: CT HEAD WITHOUT CONTRAST  TECHNIQUE: Contiguous axial images were obtained from the base of the skull through the vertex without intravenous contrast.  COMPARISON:  CT HEAD W/O CM dated 01/07/2014  FINDINGS: The brainstem, cerebellum, cerebral peduncles, thalamus, basal ganglia, basilar cisterns, and ventricular system appear within normal limits. Periventricular white matter and corona radiata hypodensities favor chronic ischemic microvascular white matter disease. No intracranial hemorrhage, mass lesion, or acute CVA. Mucosal thickening in the nasal cavity noted. The mild chronic maxillary, ethmoid, and left sphenoid sinusitis.  IMPRESSION: 1. No intracranial hemorrhage, findings of acute CVA, or other acute intracranial abnormality. 2. Chronic paranasal sinusitis with mucosal swelling and frothy fluid in the  nasal cavity.   Electronically Signed   By: Sherryl Barters M.D.   On: 01/30/2014 16:04   Ct Head Wo Contrast  01/07/2014   CLINICAL DATA:  Seizure with altered mental status.  EXAM: CT HEAD WITHOUT CONTRAST  TECHNIQUE: Contiguous axial images were obtained from the base of the skull through the vertex without contrast.  COMPARISON:  None  FINDINGS: Mildly premature cerebral and cerebellar atrophy. Hypoattenuation white matter, likely chronic microvascular ischemic change. No acute stroke or hemorrhage. No visible mass lesion or hydrocephalus. No extra-axial fluid. Calvarium intact. Minimal layering fluid in the sphenoid. No acute middle ear or mastoid fluid. Negative orbits.  IMPRESSION: Premature atrophy. Suspected chronic microvascular ischemic change without large vessel infarct. No acute intracranial findings.   Electronically Signed   By: Rolla Flatten M.D.   On: 01/07/2014 20:35   Ct Chest Wo Contrast  01/30/2014   CLINICAL DATA:  Cardiac arrest.  EXAM: CT CHEST, ABDOMEN AND PELVIS WITHOUT  CONTRAST  TECHNIQUE: Multidetector CT imaging of the chest, abdomen and pelvis was performed following the standard protocol without IV contrast.  COMPARISON:  None.  CT CHEST W/O CM dated 01/07/2014; DG CHEST 1 VIEW dated 01/30/2014  FINDINGS: CT CHEST FINDINGS  The endotracheal tube extends into the right mainstem bronchus. This was immediately called to Dr. Dina Rich on 01/30/2014 at 3 p.m. Nasogastric tube extends into the abdomen. There appears to be prominent mediastinal lymph nodes which are similar to the previous examination but poorly characterized due to the lack of contrast and motion. There are small bilateral pleural effusions, left side greater than right. No significant pericardial fluid. There is diffuse subcutaneous edema. There is compressive atelectasis in lower lobes bilaterally. Patchy densities in left upper lung. Airspace densities within the right middle lobe. There are subtle densities in the right upper lung. No evidence for a large pneumothorax. No acute bone abnormality.  CT ABDOMEN AND PELVIS FINDINGS  There is no evidence for free air. Nasogastric tube extends into the distal stomach. There is diffuse subcutaneous edema. There is a small amount of abdominal ascites. Unenhanced CT was performed per clinician order. Lack of IV contrast limits sensitivity and specificity, especially for evaluation of abdominal/pelvic solid viscera. Gallbladder may be mildly distended but poorly characterized. No gross abnormality to the liver or spleen. Limited evaluation of the pancreas and adrenal glands. No gross abnormality to the kidneys. There are prominent small bowel loops in the left abdomen with mesenteric edema. Bowel wall thickening, particularly on sequence 2, image 86. Fluid in the urinary bladder. No acute bone abnormality.  IMPRESSION: The study is very limited due to the lack of contrast and motion.  Prominent small bowel loops in the left abdomen with mesenteric edema. There is concern for  bowel wall thickening in this area. Based on the history of cardiac arrest, ischemic bowel is in the differential diagnosis. Findings could also be related to an infectious enteritis.  Bilateral small pleural effusions with patchy parenchymal lung densities. Findings could represent atelectasis and asymmetric edema. Infection cannot excluded, particularly in the right middle lobe.  Endotracheal tube in the right mainstem bronchus as described.  Small amount of abdominal ascites and diffuse subcutaneous edema. Findings are concerning for anasarca.  Mild distention of the gallbladder.   Electronically Signed   By: Markus Daft M.D.   On: 01/30/2014 15:18   Ct Chest Wo Contrast  01/07/2014   CLINICAL DATA:  Respiratory distress requiring intubation  EXAM: CT CHEST WITHOUT CONTRAST  TECHNIQUE: Multidetector CT imaging of the chest was performed following the standard protocol without IV contrast.  COMPARISON:  Chest x-ray earlier today  FINDINGS: Mediastinum: The patient is intubated. The ET tube terminates well above the carina. Hyperdense thyroid gland is nonspecific. Evaluation of the mediastinum for lymphadenopathy is limited given respiratory motion and absence of intravenous contrast material. There appear to be some a high left paratracheal borderline enlarged lymph nodes measuring up to 1 cm in short axis. Prevascular nodal tissue also measures 1 cm in short axis. Low right paratracheal node measures 1 cm in short axis. Incompletely imaged nasogastric tube traverses the thoracic esophagus.  Heart/Vascular: Limited evaluation in the absence of IV contrast. Cardiomegaly with left and right heart enlargement. No pericardial effusion. No definite aortic aneurysmal dilatation. Well-positioned right subclavian central venous catheter. Catheter tip is at the superior cavoatrial junction.  Lungs/Pleura: Moderate left larger than right pleural effusions with associated bibasilar atelectasis. The aerated portions of the  upper lungs demonstrate no significant focal airspace consolidation or suspicious pulmonary nodule. Mild interlobular septal thickening consistent with a mild interstitial edema. Respiratory motion limits evaluation for small pulmonary nodules. Secretions are noted within the trachea and there is a cut off of the right lower lobe bronchus.  Bones/Soft Tissues: No acute fracture or aggressive appearing lytic or blastic osseous lesion.  Upper Abdomen:  Unremarkable limited imaging of the upper abdomen.  IMPRESSION: 1. Moderate left larger than right bilateral pleural effusions with associated bibasilar atelectasis. 2. Mild interstitial pulmonary edema. In the setting of cardiomegaly, this is favored to reflect CHF. 3. Frothy secretions noted in the trachea distal to the endotracheal tube and within the right lower lobe bronchus resulting in a bronchial cut off sign. Strictly speaking, an endobronchial lesion cannot be completely excluded radiographically. 4. Nonspecific mediastinal adenopathy may be reactive, or related to chronic CHF. 5. Satisfactory position of support apparatus as above.   Electronically Signed   By: Jacqulynn Cadet M.D.   On: 01/07/2014 20:49   Nm Hepatobiliary  01/12/2014   CLINICAL DATA:  Upper abdominal pain  EXAM: NUCLEAR MEDICINE HEPATOBILIARY IMAGING  Views:  Anterior right upper quadrant  Radionuclide:  Technetium 36m Choletec  Dose:  5.0 mCi  Route of administration: Intravenous  COMPARISON:  None.  FINDINGS: Liver uptake of radiotracer is normal. There is prompt visualization of gallbladder and small bowel, indicating patency of the cystic and common bile ducts.  IMPRESSION: Study within normal limits.   Electronically Signed   By: Lowella Grip M.D.   On: 01/12/2014 13:14   US Renal  01/14/2014   CLINICAL DATA:  Acute kidney injury.  Renal insufficiency.  EXAM: RENAL/URINARY TRACT ULTRASOUND COMPLETE  COMPARISON:  None.  FINDINGS: Right Kidney:  Length: 11.6 cm. Diffusely  increased parenchymal echogenicity seen, consistent with medical renal disease. No mass or hydronephrosis visualized.  Left Kidney:  Length: 11.9 cm. Diffusely increased parenchymal echogenicity seen, consistent with medical renal disease. No mass or hydronephrosis visualized. Left pleural effusion also noted.  Bladder:  Empty with Foley catheter in place.  IMPRESSION: Normal renal size with increased parenchymal echogenicity, consistent with medical renal disease. No evidence of hydronephrosis.  Left pleural effusion incidentally noted.   Electronically Signed   By: Earle Gell M.D.   On: 01/14/2014 21:09   Mr Foot Left W Wo Contrast  01/09/2014   CLINICAL DATA:  Nonhealing wound on the plantar surface of the foot the first MTP joint in the diabetic patient.  EXAM: MRI  OF THE LEFT FOREFOOT WITHOUT AND WITH CONTRAST  TECHNIQUE: Multiplanar, multisequence MR imaging was performed both before and after administration of intravenous contrast.  CONTRAST:  15 mL MULTIHANCE GADOBENATE DIMEGLUMINE 529 MG/ML IV SOLN  COMPARISON:  Plain films of the left foot 01/05/2014.  FINDINGS: There is a skin ulceration on the plantar surface the foot at the level the first MTP joint. Extensive subcutaneous edema and enhancement are present about the foot consistent with cellulitis. No bone marrow signal abnormality to suggest osteomyelitis is seen. The medial sesamoid bone demonstrates decreased signal on both T1 and T2 weighted images without postcontrast enhancement likely due to chronic osteonecrosis. All visualized intrinsic musculature of the foot demonstrates increased T2 signal. There is postcontrast enhancement in the flexor hallucis brevis muscle. The patient has a small first MTP joint effusion.  IMPRESSION: Skin ulceration on the plantar surface of the foot at the first MTP joint without underlying abscess or osteomyelitis. Changes of diffuse cellulitis are identified. Edema and enhancement in the flexor hallucis brevis  muscle are compatible with myositis.  Small first MTP joint effusion is likely aseptic and incidental.  Appearance of the medial sesamoid bone of the first MTP joint most consistent with chronic osteonecrosis.   Electronically Signed   By: Inge Rise M.D.   On: 01/09/2014 13:06   Nm Pulmonary Perf And Vent  02/01/2014   CLINICAL DATA Shortness of breath, evaluate for pulmonary embolism  EXAM NUCLEAR MEDICINE VENTILATION - PERFUSION LUNG SCAN  TECHNIQUE Ventilation images were obtained in multiple projections using inhaled aerosol technetium 99 M DTPA. Perfusion images were obtained in multiple projections after intravenous injection of Tc-31m MAA.  RADIOPHARMACEUTICALS 40 mCi Tc-33m DTPA aerosol and 6 mCi Tc-12m MAA  COMPARISON NM PULMONARY VENT & PERF dated 01/09/2014; DG CHEST 1V PORT dated 01/30/2014; DG CHEST 1 VIEW dated 01/30/2014; DG CHEST 2 VIEW dated 01/26/2014  FINDINGS Review of chest radiograph performed 01/30/2014 demonstrates an enlarged cardiac silhouette and mediastinal contours. Stable position of support apparatus. No pneumothorax. External pacing pads overlie left ventricular apex. The pulmonary vasculature is indistinct with cephalization of flow. Grossly unchanged perihilar and bilateral medial basilar opacities. Unchanged small bilateral effusions. No definite pneumothorax.  Ventilation: Review the ventilatory images demonstrates clumping of inhaled radiotracer about the bilateral pulmonary hila, left greater than right. Inhaled radiotracer is seen layering within the left mainstem bronchus. There is overall poor ventilation of the bilateral lungs. A minimal amount of radiotracer is seen within the hypopharynx.  Perfusion: There is relative homogeneous distribution of injected radiotracer throughout the bilateral pulmonary parenchyma. There is minimal oligemia about the right major fissure and blunting of the left costophrenic angle, both of which are unchanged and likely secondary to the  patient's known small bilateral pleural effusions. No discrete mismatched segmental or subsegmental filling defects.  IMPRESSION Pulmonary embolism absent (very low probability for pulmonary embolism).  SIGNATURE  Electronically Signed   By: Sandi Mariscal M.D.   On: 02/01/2014 16:09   Nm Pulmonary Perf And Vent  01/09/2014   CLINICAL DATA:  Evaluate for pulmonary embolism  EXAM: NUCLEAR MEDICINE VENTILATION - PERFUSION LUNG SCAN  TECHNIQUE: Ventilation images were obtained in multiple projections using inhaled aerosol technetium 99 M DTPA. Perfusion images were obtained in multiple projections after intravenous injection of Tc-20m MAA.  COMPARISON:  DG CHEST 1V PORT dated 01/08/2014  RADIOPHARMACEUTICALS:  40 mCi Tc-43m DTPA aerosol and 6 mCi Tc-37m MAA  FINDINGS: Review of chest radiograph performed and prior demonstrates enlarged cardiac  silhouette and mediastinal contours. Endotracheal tube overlies the tracheal air column with tip superior to the carina. There are bibasilar heterogeneous opacities, left greater than right. Small left-sided pleural effusion. Mild pulmonary venous congestion without definite evidence of edema.  Ventilation: There is clumping of inhaled radiotracer about the bilateral pulmonary hila. There is otherwise adequate ventilation of the bilateral pulmonary parenchyma. There is ingested radiotracer seen within the hypopharynx, stomach and proximal small bowel.  Perfusion: There is a linear area of relative oligemia about the right major fissure secondary to known partially layering pleural effusion. There is otherwise relative homogeneous distribution of injected radiotracer throughout the pulmonary parenchyma. There are no discrete mismatched segmental or subsegmental filling defects to suggest pulmonary embolism.  IMPRESSION: Pulmonary embolism absent (very low probability for pulmonary embolism).   Electronically Signed   By: Sandi Mariscal M.D.   On: 01/09/2014 11:44   US Abdomen  Port  01/31/2014   CLINICAL DATA:  Elevated liver function tests.  EXAM: ULTRASOUND PORTABLE ABDOMEN  COMPARISON:  CT of the abdomen and pelvis 01/30/2014. Abdominal ultrasound 01/08/2014.  FINDINGS: Gallbladder:  No gallstones. Gallbladder is only partially distended. Gallbladder wall thickness is slightly increased at 4.4 mm (likely a reflection of under distention). No pericholecystic fluid. Per report from the sonographer, the patient did not exhibit a sonographic Murphy's sign on examination.  Common bile duct:  Diameter: Normal in caliber measuring 5.6 mm in the porta hepatis.  Liver:  No focal lesion identified. Within normal limits in parenchymal echogenicity. No intrahepatic biliary ductal dilatation.  IVC:  No abnormality visualized.  Pancreas:  Visualized portion unremarkable.  Spleen:  Size and appearance within normal limits. Measures 5.5 cm in length.  Right Kidney:  Length: 12.5 cm. Echogenicity within normal limits. No mass or hydronephrosis visualized.  Left Kidney:  Length: 12.4 cm. Echogenicity within normal limits. No mass or hydronephrosis visualized.  Abdominal aorta:  Largely obscured by overlying bowel gas. Measures up to 2.5 cm in diameter proximally.  Other findings:  Small volume of ascites.  Bilateral pleural effusions.  IMPRESSION: 1. No definite acute findings to account for the patient's symptoms. 2. Although the gallbladder wall appears mildly thickened at 4.4 mm, this is nonspecific and likely related to under distention of the gallbladder on today's examination, as well as a generalized state of anasarca given the presence of ascites and pleural effusions. No definite findings to suggest acute cholecystitis are noted at this time. Additionally, there is no intra or extrahepatic biliary ductal dilatation.   Electronically Signed   By: Vinnie Langton M.D.   On: 01/31/2014 07:18   [place] Art/ven Flow Abd Pelv Doppler  01/08/2014   CLINICAL DATA:  Hypotension and worsening liver  function tests.  EXAM: DUPLEX ULTRASOUND OF LIVER  TECHNIQUE: Color and duplex Doppler ultrasound was performed to evaluate the hepatic in-flow and out-flow vessels.  COMPARISON:  DG ABD PORTABLE 1V dated 01/07/2014; CT CHEST W/O CM dated 01/07/2014; US ABDOMEN LIMITED RUQ/ASCITES dated 01/08/2014  FINDINGS: Portal Vein Velocities  Main:  28-30 cm/sec  Right:  21 cm/sec  Left:  19 cm/sec  Hepatic Vein Velocities  Right:  48 cm/sec  Middle:  44 cm/sec  Left:  40 cm/sec  Hepatic Artery Velocity:  60 cm/sec  Splenic Vein Velocity:  21 cm/sec  Varices: None identified.  Ascites: None identified.  The portal and main hepatic veins are widely patent without evidence of thrombus. Flow direction and velocities are normal. The intrahepatic IVC is widely patent. The  spleen is normal in size.  IMPRESSION: Unremarkable duplex ultrasound of the liver demonstrating no evidence of portal vein thrombosis or hepatic veno-occlusive disease.   Electronically Signed   By: Aletta Edouard M.D.   On: 01/08/2014 11:57   Dg Chest Port 1 View  02/02/2014   CLINICAL DATA Dyspnea  EXAM PORTABLE CHEST - 1 VIEW  COMPARISON 01/30/2014  FINDINGS Cardiomediastinal contours are mildly prominent. Central vascular congestion. Interstitial prominence and patchy bibasilar opacities. Small effusions not excluded. No pneumothorax. Multilevel degenerative change. Multiple wires/tubes are presumably external.  IMPRESSION Hypoaeration. Central vascular congestion and interstitial and hazy airspace opacities may reflect edema (favored) versus multifocal infection.  Small effusions and bibasilar opacities, favor atelectasis.  SIGNATURE  Electronically Signed   By: Carlos Levering M.D.   On: 02/02/2014 22:59   Dg Chest Port 1 View  01/30/2014   CLINICAL DATA:  Central line placement.  EXAM: PORTABLE CHEST - 1 VIEW  COMPARISON:  01/30/2014 1431 hr  FINDINGS: Right internal jugular central venous line has its tip in the lower superior vena cava. There is no  pneumothorax.  Endotracheal tube has been retracted. Tip now lies 3.7 cm above the chronic, well positioned.  Nasogastric tube is stable passing well into the stomach. Lung base opacity is also unchanged.  IMPRESSION: 1. Right internal jugular central venous line tip in the lower superior vena cava. No pneumothorax. 2. Endotracheal tube well positioned.   Electronically Signed   By: Lajean Manes M.D.   On: 01/30/2014 21:01   Dg Chest Port 1 View  01/08/2014   CLINICAL DATA:  Respiratory failure.  EXAM: PORTABLE CHEST - 1 VIEW  COMPARISON:  CT CHEST W/O CM dated 01/07/2014; DG CHEST 1V PORT dated 01/07/2014  FINDINGS: Endotracheal tube remains with the tip approximately 3.5 cm above the carina. The lungs show improved aeration at both bases with residual atelectasis remaining, left greater than right. There is a small left pleural effusion. No overt pulmonary edema is identified. The heart remains mildly enlarged. No pneumothorax is present. Central line positioning is stable.  IMPRESSION: Improved aeration of both lower lobes, residual atelectasis remains, left greater than right with associated left pleural effusion.   Electronically Signed   By: Aletta Edouard M.D.   On: 01/08/2014 08:48   Dg Chest Port 1 View  01/07/2014   CLINICAL DATA:  ETT and central venous catheter.  EXAM: PORTABLE CHEST - 1 VIEW  COMPARISON:  01/07/2014 and 01/05/2014  FINDINGS: Enteric tube courses through the region of the stomach and off the inferior portion of the film as tip is not visualized. Endotracheal tube is present with tip 3.4 cm above the carina. A right subclavian central venous catheter is present with tip over the SVC below the level of the carina. Lungs are hypoinflated. There is no right-sided pneumothorax. There is mild worsening perihilar bibasilar opacification as findings likely due to small effusions with bibasilar atelectasis and possible mild vascular congestion. Cannot exclude infection. Mild stable  cardiomegaly. Remainder of the exam is unchanged.  IMPRESSION: Hypoinflation with mild perihilar bibasilar opacification likely small effusions with bibasilar atelectasis. There is likely a component of vascular congestion. Cannot exclude infection in the lung bases.  Tubes and lines as described.  No evidence of pneumothorax.   Electronically Signed   By: Marin Olp M.D.   On: 01/07/2014 18:48   Dg Chest Port 1 View  01/07/2014   CLINICAL DATA:  Shortness of breath.  EXAM: PORTABLE CHEST - 1  VIEW  COMPARISON:  01/05/2014.  FINDINGS: The cardiac silhouette, mediastinal and hilar contours are Stable. Mild cardiac enlargement. There are bilateral effusions, left greater than right with overlying atelectasis. Low lung volumes with vascular crowding. Mild central vascular congestion but no overt pulmonary edema. The bony thorax is intact.  IMPRESSION: Persistent effusions and atelectasis.  Low lung volumes with vascular crowding.   Electronically Signed   By: Kalman Jewels M.D.   On: 01/07/2014 16:40   Dg Abd Portable 1v  01/07/2014   CLINICAL DATA:  Severe right-side abdominal pain  EXAM: PORTABLE ABDOMEN - 1 VIEW  COMPARISON:  Portable exam 1622 hr without priors for comparison  FINDINGS: Scattered bowel loops.  No bowel dilatation or bowel wall thickening.  Small left pleural effusion.  Slightly increased attenuation of the abdomen and nonvisualization of hepatic and splenic margins could reflect ascites.  Bones unremarkable.  No definite urinary tract calcification.  IMPRESSION: Nonobstructive bowel gas pattern.  Small left pleural effusion.  Cannot exclude ascites.   Electronically Signed   By: Lavonia Dana M.D.   On: 01/07/2014 16:32   Dg Foot Complete Left  01/05/2014   CLINICAL DATA:  Left-sided foot pain.  EXAM: LEFT FOOT - COMPLETE 3+ VIEW  COMPARISON:  No priors.  FINDINGS: Three views of the left foot demonstrate diffuse soft tissue swelling of the distal aspect of the great toe. Along the  plantar surface of the foot there are several locular some gas in the swollen soft tissues beneath the distal first metatarsal, concerning for an ulcer and soft tissue infection. No definite osteolysis in the underlying bone to strongly suggest the presence of osteomyelitis at this time. No acute displaced fracture, subluxation or dislocation.  IMPRESSION: 1. Probable soft tissue ulceration/infection with gas in the plantar aspect of the foot beneath the great toe (adjacent to the distal first metatarsal). No definite signs to strongly suggest frank osteomyelitis at this time, but clinical correlation is recommended.   Electronically Signed   By: Vinnie Langton M.D.   On: 01/05/2014 17:30   US Abdomen Limited Ruq  01/08/2014   CLINICAL DATA:  Abdominal pain  EXAM: US ABDOMEN LIMITED - RIGHT UPPER QUADRANT  COMPARISON:  [place] ART/VEN ABD/PELV/SCROTUM DOP dated 01/08/2014; CT CHEST W/O CM dated 01/07/2014  FINDINGS: Gallbladder:  There is marked thickening of the gallbladder wall measuring approximately 1.1 cm in diameter (image 8). This finding is associated with potential very minimal amount of associated pericholecystic fluid. No definitive radiopaque gallstones or gallbladder sludge. Sonographic Percell Miller sign is unable to be performed secondary to patient is unresponsive state.  Common bile duct:  Diameter: Normal in size measuring 5.8 mm in diameter  Liver:  Homogeneous hepatic echotexture. No discrete hepatic lesions. No definite evidence of intrahepatic biliary ductal dilatation. No ascites.  IMPRESSION: Findings are worrisome for acute acalculous cholecystitis. Further evaluation could be performed with HIDA scan as clinically indicated.  These results will be called to the ordering clinician or representative by the Radiologist Assistant, and communication documented in the PACS Dashboard.   Electronically Signed   By: Sandi Mariscal M.D.   On: 01/08/2014 12:07     ASSESSMENT/PLAN:  1. Cardiac arrest -  asystole then to PEA. He was on beta blockers and verapamil at home. Acute PE ruled out by VQ scan this admit. 2D echo shows normal LVF and EKG shows only nonspecific T wave abnormality. Cardiac enzymes are normal. 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  Appreciate EP input.  Will continue to keep off AV nodal blocking agents. There has been no documentation of sustained ventricular or atrial arrhythmias or bradycardia and he has preserved LV function with normal coronary arteries by cath this admission.  2.  HTN - needs aggressive BP control.   No further cardiac workup at this time.  Will sign off.  Call with any questions.   Sueanne Margarita, MD  02/03/2014  10:13 AM

## 2014-02-04 ENCOUNTER — Ambulatory Visit (HOSPITAL_COMMUNITY): Payer: PRIVATE HEALTH INSURANCE

## 2014-02-04 DIAGNOSIS — N049 Nephrotic syndrome with unspecified morphologic changes: Secondary | ICD-10-CM

## 2014-02-04 DIAGNOSIS — E1139 Type 2 diabetes mellitus with other diabetic ophthalmic complication: Secondary | ICD-10-CM

## 2014-02-04 DIAGNOSIS — R0989 Other specified symptoms and signs involving the circulatory and respiratory systems: Secondary | ICD-10-CM

## 2014-02-04 DIAGNOSIS — E11319 Type 2 diabetes mellitus with unspecified diabetic retinopathy without macular edema: Secondary | ICD-10-CM

## 2014-02-04 DIAGNOSIS — E11359 Type 2 diabetes mellitus with proliferative diabetic retinopathy without macular edema: Secondary | ICD-10-CM

## 2014-02-04 DIAGNOSIS — R0609 Other forms of dyspnea: Secondary | ICD-10-CM

## 2014-02-04 DIAGNOSIS — E11311 Type 2 diabetes mellitus with unspecified diabetic retinopathy with macular edema: Secondary | ICD-10-CM

## 2014-02-04 DIAGNOSIS — R569 Unspecified convulsions: Secondary | ICD-10-CM

## 2014-02-04 DIAGNOSIS — E1142 Type 2 diabetes mellitus with diabetic polyneuropathy: Secondary | ICD-10-CM

## 2014-02-04 DIAGNOSIS — N058 Unspecified nephritic syndrome with other morphologic changes: Secondary | ICD-10-CM

## 2014-02-04 DIAGNOSIS — E1149 Type 2 diabetes mellitus with other diabetic neurological complication: Secondary | ICD-10-CM

## 2014-02-04 LAB — BASIC METABOLIC PANEL
BUN: 36 mg/dL — ABNORMAL HIGH (ref 6–23)
CO2: 26 mEq/L (ref 19–32)
Calcium: 8.3 mg/dL — ABNORMAL LOW (ref 8.4–10.5)
Chloride: 103 mEq/L (ref 96–112)
Creatinine, Ser: 1.51 mg/dL — ABNORMAL HIGH (ref 0.50–1.35)
GFR calc Af Amer: 55 mL/min — ABNORMAL LOW (ref >90)
GFR calc non Af Amer: 48 mL/min — ABNORMAL LOW (ref >90)
Glucose, Bld: 112 mg/dL — ABNORMAL HIGH (ref 70–99)
Potassium: 4 mEq/L (ref 3.7–5.3)
SODIUM: 142 meq/L (ref 137–147)

## 2014-02-04 LAB — GLUCOSE, CAPILLARY
Glucose-Capillary: 106 mg/dL — ABNORMAL HIGH (ref 70–99)
Glucose-Capillary: 188 mg/dL — ABNORMAL HIGH (ref 70–99)
Glucose-Capillary: 180 mg/dL — ABNORMAL HIGH (ref 70–99)
Glucose-Capillary: 203 mg/dL — ABNORMAL HIGH (ref 70–99)

## 2014-02-04 LAB — URINALYSIS, ROUTINE W REFLEX MICROSCOPIC
BILIRUBIN URINE: NEGATIVE
Glucose, UA: 250 mg/dL — AB
Hgb urine dipstick: NEGATIVE
Ketones, ur: NEGATIVE mg/dL
Leukocytes, UA: NEGATIVE
NITRITE: NEGATIVE
PROTEIN: 100 mg/dL — AB
Specific Gravity, Urine: 1.016 (ref 1.005–1.030)
UROBILINOGEN UA: 0.2 mg/dL (ref 0.0–1.0)
pH: 5 (ref 5.0–8.0)

## 2014-02-04 LAB — CBC
HCT: 25.2 % — ABNORMAL LOW (ref 39.0–52.0)
Hemoglobin: 7.8 g/dL — ABNORMAL LOW (ref 13.0–17.0)
MCH: 24.1 pg — AB (ref 26.0–34.0)
MCHC: 31 g/dL (ref 30.0–36.0)
MCV: 78 fL (ref 78.0–100.0)
PLATELETS: 331 10*3/uL (ref 150–400)
RBC: 3.23 MIL/uL — ABNORMAL LOW (ref 4.22–5.81)
RDW: 21.4 % — AB (ref 11.5–15.5)
WBC: 9.3 10*3/uL (ref 4.0–10.5)

## 2014-02-04 LAB — PROTIME-INR
INR: 1.22 (ref 0.00–1.49)
PROTHROMBIN TIME: 15.1 s (ref 11.6–15.2)

## 2014-02-04 LAB — URINE MICROSCOPIC-ADD ON

## 2014-02-04 MED ORDER — METOLAZONE 5 MG PO TABS
5.0000 mg | ORAL_TABLET | Freq: Two times a day (BID) | ORAL | Status: DC
  Administered 2014-02-04 – 2014-02-05 (×3): 5 mg via ORAL
  Filled 2014-02-04 (×5): qty 1

## 2014-02-04 MED ORDER — WARFARIN SODIUM 7.5 MG PO TABS
7.5000 mg | ORAL_TABLET | Freq: Once | ORAL | Status: AC
  Administered 2014-02-04: 7.5 mg via ORAL
  Filled 2014-02-04: qty 1

## 2014-02-04 NOTE — Progress Notes (Addendum)
[facility] PROGRESS NOTE  Joshua [name] [id number] DOB: 19-May-1951 DOA: 01/30/2014 PCP: Duwaine Maxin, DO  Assessment/Plan:  Asystole -Resolved, will avoid all AV nodal blocking agent  Tachybradycardia syndrome -See asystole  Chronic diastolic CHF -Patient unsure of dry weight, however currently anasarca -Continue Norvasc 10 mg daily -Continue clonidine tablet 0.1 mg TID -Continue hydralazine 50 mg TID -Continue Imdur 30 mg daily -Patient seriously fluid overloaded increase Lasix to 60 mg BID; start Zaroxolyn 5 mg BID  HTN -See diastolic CHF  Seizure -Currently not on seizure medication, patient unsure of any previous medication and cause. -Speak with nephew in the a.m.  Nephrotic syndrome -Obtain urinalysis  Diabetes type 2 with retinopathy/neuropathy -Hemoglobin A1c on 11/26/2013= 6.5; within ADA guidelines -Continue moderate SSI  HLD -Although patient's LDL greater than ADA goal of70mg /dL, his extremely high HDL of 83 mg/dL counseled on his mildly elevated LDL  Suicidal ideation -Patient expressed to PT Joshua [name] I just want to die" "just kill me -Place patient on suicide watch overnight -Contact psychiatry in the a.m.  DVT popliteal vein -Continue Coumadin per pharmacy  Suicidal ideation -Place on suicide watch -Call adult psychiatry for evaluation in the a.m.    Code Status: Full Family Communication: None available Disposition Plan: Resolution of decompensated diastolic CHF     Consultants:   Procedures: Echocardiogram 01/31/2014 - Left ventricle: The cavity size was normal. Wall thickness was normal. Systolic function was normal. -LVEF= 55% to 60%.Systolic function was mildly reduced. - Aortic valve: Trivial regurgitation. - Left atrium: The atrium was mildly dilated. - Right ventricle: The cavity size was mildly dilated. - Right atrium: The atrium was moderately dilated. - Atrial septum: No defect or patent foramen ovale was  identified.    Antibiotics:    HPI/Subjective: [name] PMHx  DM type 2 with proliferative retinopathy/neuropathy, CKD STAGE III, HTN, diastolic CHF, Hx tuberculosis, Hx seizures, nephrotic syndrome, Hx DVT popliteal vein. 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 Patient was previously admitted to [facility] on [id number] which was complicated by bradycardia/hypotension, and seizure while in the ICU. Patient was intubated 2/13-2/14. Follow have bilateral peroneal DVTs, bilateral pleural effusions (negative for PE), acute acalculous cholecystitis. Patient admitted on 01/30/2014 Patient undergoing outpatient kub due to diarrhea, abdominal distention (from rehab center) when he had asystole/PEA arrest with ROSC in 14 minutes. 3/8 weight= 75.3 kg 3/13 patient states still having some SOB , negative CP, positive abdominal and lower extremity swelling. Patient unsure of his dry weight. Patient on 3 L O2 via [place] SpO2= 99%. Patient states does not use home O2     Objective: Filed Vitals:   02/04/14 1725 02/04/14 2120 02/05/14 0500 02/05/14 0529  BP: 131/73 151/88  136/74  Pulse:  89  83  Temp:  98.5 F (36.9 C)  99.1 F (37.3 C)  TempSrc:  Oral  Oral  Resp:  20  19  Height:      Weight:   77.4 kg (170 lb 10.2 oz)   SpO2:  97%  97%    Intake/Output Summary (Last 24 hours) at 02/05/14 0743 Last data filed at 02/05/14 0700  Gross per 24 hour  Intake    480 ml  Output   2800 ml  Net  -2320 ml   Filed Weights   02/02/14 0500 02/03/14 0500 02/05/14 0500  Weight: 77.3 kg (170 lb 6.7 oz) 75.3 kg (166 lb 0.1 oz) 77.4 kg (170 lb 10.2 oz)  Exam:   General:  A./O. x4, NAD  Cardiovascular: Rhythm and rate, negative murmurs rubs or gallops  Respiratory: Clear to auscultation bilateral   Abdomen: Distended abdomen, nontender, tense, plus bowel sounds (anasarca)   Musculoskeletal: Pedal edema to 2-3+ to hips bilateral    Data Reviewed: Basic  Metabolic Panel:  Recent Labs Lab 01/30/14 2028 01/31/14 0330 01/31/14 0410 02/01/14 0400 02/02/14 0440 02/02/14 1228 02/04/14 0457  NA 143 145 144 145 140  --  142  K 4.1 3.9 3.9 4.2 4.2  --  4.0  CL 108 107 107 108 103  --  103  CO2 26 24 26 25 26   --  26  GLUCOSE 162* 88 88 113* 160*  --  112*  BUN 59* 54* 53* 43* 44*  --  36*  CREATININE 1.78* 1.61* 1.57* 1.59* 1.68* 1.54* 1.51*  CALCIUM 8.9 8.8 8.8 8.7 8.5  --  8.3*  MG 2.1  --   --   --   --   --   --   PHOS 3.9 3.2  --   --   --   --   --    Liver Function Tests:  Recent Labs Lab 01/30/14 1430 01/30/14 2028 01/31/14 0330 01/31/14 0410  AST 41* 42*  --  31  ALT 59* 59*  --  54*  ALKPHOS 526* 449*  --  438*  BILITOT 0.3 0.3  --  0.3  PROT 7.0 6.4  --  6.4  ALBUMIN 1.9* 1.7*  1.7* 1.7* 1.6*   No results found for this basename: LIPASE, AMYLASE,  in the last 168 hours No results found for this basename: AMMONIA,  in the last 168 hours CBC:  Recent Labs Lab 01/30/14 1430  02/02/14 0440 02/02/14 1228 02/03/14 0420 02/04/14 0457 02/05/14 0547  WBC 7.2  < > 9.9 9.0 8.4 9.3 7.9  NEUTROABS 5.4  --   --   --   --   --   --   HGB 7.5*  < > 8.3* 8.2* 7.8* 7.8* 7.5*  HCT 24.0*  < > 26.7* 26.1* 24.9* 25.2* 23.8*  MCV 79.7  < > 78.3 78.4 77.8* 78.0 77.0*  PLT 334  < > 436* 394 376 331 310  < > = values in this interval not displayed. Cardiac Enzymes:  Recent Labs Lab 01/30/14 2028 01/31/14 0108 01/31/14 0630  TROPONINI <0.30 <0.30 <0.30   BNP (last 3 results)  Recent Labs  01/30/14 2028  PROBNP 9165.0*   CBG:  Recent Labs Lab 02/03/14 2124 02/04/14 0737 02/04/14 1119 02/04/14 1652 02/04/14 2128  GLUCAP 218* 106* 188* 180* 203*    Recent Results (from the past 240 hour(s))  MRSA PCR SCREENING     Status: None   Collection Time    01/30/14  6:31 PM      Result Value Ref Range Status   MRSA by PCR NEGATIVE  NEGATIVE Final   Comment:            The GeneXpert MRSA Assay (FDA     approved  for NASAL specimens     only), is one component of a     comprehensive MRSA colonization     surveillance program. It is not     intended to diagnose MRSA     infection nor to guide or     monitor treatment for     MRSA infections.  CULTURE, BLOOD (ROUTINE X 2)     Status: None  Collection Time    01/30/14  8:28 PM      Result Value Ref Range Status   Specimen Description BLOOD A-LINE   Final   Special Requests BOTTLES DRAWN AEROBIC AND ANAEROBIC 10CC EACH   Final   Culture  Setup Time     Final   Value: 01/31/2014 08:44     Performed at [facility]   Culture     Final   Value:        BLOOD CULTURE RECEIVED NO GROWTH TO DATE CULTURE WILL BE HELD FOR 5 DAYS BEFORE ISSUING A FINAL NEGATIVE REPORT     Performed at [facility]   Report Status PENDING   Incomplete  CULTURE, BLOOD (ROUTINE X 2)     Status: None   Collection Time    01/30/14  8:46 PM      Result Value Ref Range Status   Specimen Description BLOOD CENTRAL LINE   Final   Special Requests BOTTLES DRAWN AEROBIC AND ANAEROBIC 10CC EACH   Final   Culture  Setup Time     Final   Value: 01/31/2014 08:46     Performed at [facility]   Culture     Final   Value:        BLOOD CULTURE RECEIVED NO GROWTH TO DATE CULTURE WILL BE HELD FOR 5 DAYS BEFORE ISSUING A FINAL NEGATIVE REPORT     Performed at [facility]   Report Status PENDING   Incomplete     Studies: No results found.  Scheduled Meds: . amLODipine  10 mg Oral Daily  . cloNIDine  0.1 mg Oral TID  . enoxaparin (LOVENOX) injection  75 mg Subcutaneous Q12H  . furosemide  40 mg Intravenous Q12H  . hydrALAZINE  50 mg Oral 3 times per day  . insulin aspart  0-15 Units Subcutaneous TID WC  . isosorbide mononitrate  30 mg Oral Daily  . metolazone  5 mg Oral BID  . warfarin  6 mg Oral ONCE-1800  . Warfarin - Pharmacist Dosing Inpatient   Does not apply q1800   Continuous Infusions: . sodium chloride Stopped (02/03/14 2256)     Active Problems:   Pulmonary tuberculosis   Hypertension   Diabetic neuropathy   New onset seizure   Diabetic proliferative retinopathy   DVT of popliteal vein   Acute on chronic diastolic heart failure   Nephrotic syndrome   Cardiac arrest   Suicide ideation    Time spent:60 minute    WOODS, CURTIS, J  [facility] Pager [phone]. If 7PM-7AM, please contact night-coverage at [email or web address], password [facility] 02/05/2014, 7:43 AM  LOS: 6 days

## 2014-02-04 NOTE — Clinical Social Work Psychosocial (Signed)
Clinical Social Work Department  BRIEF PSYCHOSOCIAL ASSESSMENT  Patient:Zohan A Viloria Account Number: [id number]   Admit date: 01/30/14 Clinical Social Worker Pamila Mendibles Lia Foyer, MSW Date/Time:  Referred by: Physician Date Referred:  Referred for   Return to SNF/ Passive Suicidal Ideations  Other Referral:  Interview type: Patient  Other interview type: PSYCHOSOCIAL DATA  Living Status: Alone Admitted from facility: [facility] Level of care: SNF Primary support name: Ogunyemi,Kolawole  Primary support relationship to patient: Cousin Degree of support available:  Strong and vested  CURRENT CONCERNS  Current Concerns   Post-Acute Placement   Other Concerns:  SOCIAL WORK ASSESSMENT / PLAN  CSW met with pt to discuss returning to [place] and passive suicidal ideations. Patient reported that he is agreeable to returning to [facility]. Patient reported that he is agreeable to returning to [facility]. When asked about Suicidal ideations, patient reported that he was having thoughts of harming himself at the facility but he was not having those thoughts now. CSW asked why he was having those thoughts and he said "because of my situation". CSW stated that his situation is the same and asked him what has changed at the hospital. Patient reported that his family asked him not to say anything like that anymore. CSW asked again if he was having any thoughts of suicide. Patient stated no. But he did tell the Physical Therapist, "I just want to die, Just kill me". CSW notified MD and a sitter is being put in place.              Assessment/plan status: Information/Referral to [employer]  Other assessment/ plan:  Information/referral to community resources:  SNF     PATIENT'S/FAMILY'S RESPONSE TO PLAN OF CARE:  Pt  Is agreeable to returning to SNF. Patient seems depressed and was very lethargic.  CSW will continue to follow.  MD notified about everything above.   Rhea Pink,  MSW, [place]

## 2014-02-04 NOTE — Progress Notes (Signed)
Patient refused dressing change

## 2014-02-04 NOTE — Progress Notes (Signed)
ANTICOAGULATION CONSULT NOTE - Follow Up Consult  Pharmacy Consult for Coumadin and Lovenox Indication: Recent bilateral DVT  Allergies  Allergen Reactions  . Aspirin Itching    Patient Measurements: Height: 5\' 6"  (167.6 cm) Weight:  (pt refused/slept in chair) IBW/kg (Calculated) : 63.8 Heparin Dosing Weight:   Vital Signs: Temp: 97.7 F (36.5 C) (03/13 0503) Temp src: Oral (03/13 0503) BP: 154/80 mmHg (03/13 0944) Pulse Rate: 78 (03/13 0503)  Labs:  Recent Labs  02/02/14 0440 02/02/14 1228 02/03/14 0420 02/04/14 0457  HGB 8.3* 8.2* 7.8* 7.8*  HCT 26.7* 26.1* 24.9* 25.2*  PLT 436* 394 376 331  APTT 42*  --   --   --   LABPROT 19.0*  --  17.3* 15.1  INR 1.64*  --  1.45 1.22  CREATININE 1.68* 1.54*  --  1.51*    Estimated Creatinine Clearance: 45.8 ml/min (by C-G formula based on Cr of 1.51).  Assessment: 62yom on Lovenox bridging to Coumadin for recent DVT (2/15). INR (1.22) is subtherapeutic as expected after Coumadin was restarted last PM. Patient was admitted with an elevated INR of 4.68 on PTA regimen of alternating Coumadin doses of 7.5mg  and 5mg  daily. INR may be initially resistant due to Vitamin K doses received on 3/9, 3/10.  - H/H stable, Plts improving - No significant bleeding reported  Goal of Therapy:  INR 2-3 Anti-Xa level 0.6-1 units/ml 4hrs after LMWH dose given Monitor platelets by anticoagulation protocol: Yes   Plan:  1. Repeat Coumadin 7.5mg  PO x 1 today 2. Continue Lovenox 75mg  SQ q12h  3. Follow-up AM INR and CBC  Earleen Newport [id number] 02/04/2014,1:38 PM

## 2014-02-05 DIAGNOSIS — F329 Major depressive disorder, single episode, unspecified: Secondary | ICD-10-CM

## 2014-02-05 DIAGNOSIS — R45851 Suicidal ideations: Secondary | ICD-10-CM

## 2014-02-05 DIAGNOSIS — N181 Chronic kidney disease, stage 1: Secondary | ICD-10-CM

## 2014-02-05 LAB — GLUCOSE, CAPILLARY
GLUCOSE-CAPILLARY: 121 mg/dL — AB (ref 70–99)
GLUCOSE-CAPILLARY: 145 mg/dL — AB (ref 70–99)
GLUCOSE-CAPILLARY: 208 mg/dL — AB (ref 70–99)
Glucose-Capillary: 142 mg/dL — ABNORMAL HIGH (ref 70–99)

## 2014-02-05 LAB — CBC WITH DIFFERENTIAL/PLATELET
BASOS ABS: 0 10*3/uL (ref 0.0–0.1)
Basophils Relative: 0 % (ref 0–1)
EOS ABS: 0.4 10*3/uL (ref 0.0–0.7)
EOS PCT: 4 % (ref 0–5)
HEMATOCRIT: 26.8 % — AB (ref 39.0–52.0)
Hemoglobin: 8.4 g/dL — ABNORMAL LOW (ref 13.0–17.0)
Lymphocytes Relative: 16 % (ref 12–46)
Lymphs Abs: 1.6 10*3/uL (ref 0.7–4.0)
MCH: 24.3 pg — AB (ref 26.0–34.0)
MCHC: 31.3 g/dL (ref 30.0–36.0)
MCV: 77.5 fL — AB (ref 78.0–100.0)
MONO ABS: 0.5 10*3/uL (ref 0.1–1.0)
Monocytes Relative: 5 % (ref 3–12)
Neutro Abs: 7.4 10*3/uL (ref 1.7–7.7)
Neutrophils Relative %: 75 % (ref 43–77)
PLATELETS: 345 10*3/uL (ref 150–400)
RBC: 3.46 MIL/uL — ABNORMAL LOW (ref 4.22–5.81)
RDW: 20.7 % — AB (ref 11.5–15.5)
WBC: 9.9 10*3/uL (ref 4.0–10.5)

## 2014-02-05 LAB — PROTIME-INR
INR: 1.42 (ref 0.00–1.49)
Prothrombin Time: 17 seconds — ABNORMAL HIGH (ref 11.6–15.2)

## 2014-02-05 LAB — CBC
HCT: 23.8 % — ABNORMAL LOW (ref 39.0–52.0)
Hemoglobin: 7.5 g/dL — ABNORMAL LOW (ref 13.0–17.0)
MCH: 24.3 pg — ABNORMAL LOW (ref 26.0–34.0)
MCHC: 31.5 g/dL (ref 30.0–36.0)
MCV: 77 fL — AB (ref 78.0–100.0)
Platelets: 310 10*3/uL (ref 150–400)
RBC: 3.09 MIL/uL — ABNORMAL LOW (ref 4.22–5.81)
RDW: 20.9 % — AB (ref 11.5–15.5)
WBC: 7.9 10*3/uL (ref 4.0–10.5)

## 2014-02-05 LAB — COMPREHENSIVE METABOLIC PANEL
ALBUMIN: 1.9 g/dL — AB (ref 3.5–5.2)
ALK PHOS: 323 U/L — AB (ref 39–117)
ALT: 29 U/L (ref 0–53)
AST: 22 U/L (ref 0–37)
BILIRUBIN TOTAL: 0.3 mg/dL (ref 0.3–1.2)
BUN: 32 mg/dL — ABNORMAL HIGH (ref 6–23)
CHLORIDE: 99 meq/L (ref 96–112)
CO2: 30 meq/L (ref 19–32)
CREATININE: 1.49 mg/dL — AB (ref 0.50–1.35)
Calcium: 8.5 mg/dL (ref 8.4–10.5)
GFR calc Af Amer: 56 mL/min — ABNORMAL LOW (ref >90)
GFR, EST NON AFRICAN AMERICAN: 49 mL/min — AB (ref >90)
Glucose, Bld: 224 mg/dL — ABNORMAL HIGH (ref 70–99)
POTASSIUM: 3.7 meq/L (ref 3.7–5.3)
Sodium: 139 mEq/L (ref 137–147)
Total Protein: 6.9 g/dL (ref 6.0–8.3)

## 2014-02-05 LAB — MAGNESIUM: Magnesium: 1.8 mg/dL (ref 1.5–2.5)

## 2014-02-05 MED ORDER — FLUOXETINE HCL 10 MG PO CAPS
10.0000 mg | ORAL_CAPSULE | Freq: Every day | ORAL | Status: DC
  Administered 2014-02-05 – 2014-02-14 (×10): 10 mg via ORAL
  Filled 2014-02-05 (×10): qty 1

## 2014-02-05 MED ORDER — WARFARIN SODIUM 6 MG PO TABS
6.0000 mg | ORAL_TABLET | Freq: Once | ORAL | Status: AC
  Administered 2014-02-05: 6 mg via ORAL
  Filled 2014-02-05: qty 1

## 2014-02-05 NOTE — Progress Notes (Signed)
ANTICOAGULATION CONSULT NOTE - Follow Up Consult  Pharmacy Consult for Coumadin Indication: Recent bilateral DVT  Allergies  Allergen Reactions  . Aspirin Itching    Patient Measurements: Height: 5\' 6"  (167.6 cm) Weight: 170 lb 10.2 oz (77.4 kg) IBW/kg (Calculated) : 63.8 Heparin Dosing Weight:   Vital Signs: Temp: 99.1 F (37.3 C) (03/14 0529) Temp src: Oral (03/14 0529) BP: 136/74 mmHg (03/14 0529) Pulse Rate: 83 (03/14 0529)  Labs:  Recent Labs  02/02/14 1228 02/03/14 0420 02/04/14 0457 02/05/14 0547  HGB 8.2* 7.8* 7.8* 7.5*  HCT 26.1* 24.9* 25.2* 23.8*  PLT 394 376 331 310  LABPROT  --  17.3* 15.1 17.0*  INR  --  1.45 1.22 1.42  CREATININE 1.54*  --  1.51*  --     Estimated Creatinine Clearance: 49.6 ml/min (by C-G formula based on Cr of 1.51).  Assessment: 62yom on Lovenox bridging to Coumadin for recent DVT (2/15). INR (1.42) is subtherapeutic but trended up overnight. Patient was admitted with an elevated INR of 4.68 on PTA regimen of alternating Coumadin doses of 7.5mg  and 5mg  daily. - Hg down to 7.5, Plts stable - monitor for s/sx bleeding - No significant bleeding reported  Goal of Therapy:  INR 2-3 Anti-Xa level 0.6-1 units/ml 4hrs after LMWH dose given Monitor platelets by anticoagulation protocol: Yes   Plan:  1. Coumadin 6mg  PO x 1 today 2. Continue Lovenox 75mg  SQ q12h  3. Follow-up AM INR and CBC   Earleen Newport [id number] 02/05/2014,7:30 AM

## 2014-02-05 NOTE — Consult Note (Signed)
[facility] Face-to-Face Psychiatry Consult   Reason for Consult:  Depression and suicidal ideation Referring Physician:  Dr. Vivia Budge is an 63 y.o. male. Total Time spent with patient: 45 minutes  Assessment: AXIS I:  Major Depression, single episode AXIS II:  Deferred AXIS III:   Past Medical History  Diagnosis Date  . 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 Hypertension 10/14/2012  . Type 2 diabetes mellitus   . Diabetic nephropathy with proteinuria   . 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 Diastolic heart failure   . Cellulitis   . Convulsions   . Iron deficiency anemia   . Venous thrombosis and embolism   . Anasarca   . Hypoalbuminemia   . Elevated alkaline phosphatase level   . Microcytic anemia   . GI bleed   . Nephrotic syndrome    AXIS IV:  other psychosocial or environmental problems, problems related to social environment and problems with primary support group AXIS V:  51-60 moderate symptoms  Plan:  No evidence of imminent risk to self or others at present.   Patient does not meet criteria for psychiatric inpatient admission. Supportive therapy provided about ongoing stressors. start Fluoxetine 10 mg PO QD for depression Followup as clinically required and may call [phone] if needed further assistance  Subjective:   Joshua [name] is a 63 y.o. male patient admitted with depression and suicidal ideation   HPI:  The patient is a 63 yo man was seen for psychiatric consultation evaluation because he has been reporting symptoms of depression, tearfulness and passive suicidal ideation without intention or plan. Patient reported he has no previous history of acute psychiatric hospitalization or outpatient psychiatric management. Patient never received any substance abuse treatment. Patient cousin who was at bedside stated that he has been feeling depressed and down for about a week before that he was able to stay himself in the house and care  for himself in the house without any difficulties. Patient contracts for safety and requested medication management for depression as long as it does not cause sedation or admission.  Medical history: Patient also reportedly suffered with multiple medical problems and he has history of DM2, HTN, presenting with a foot ulcer. The patient notes a left plantar foot ulcer, proximal to the 1st MTP, present for about the last 1 year (per pt report), though gradually increasing in size, now with increasing pain with weight bearing. Associated with chills, but no fever, lightheadedness, or nausea. The patient was seen in clinic 2/11, at which time he was noted to have an elevated WBC = 14.1, ESR = 96, and foot x-ray revealing subcutaneous gas around the lesion. The patient was seen in clinic again today, and recommended for admission, to rule out osteomyelitis   Past Psychiatric History: Past Medical History  Diagnosis Date  . 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 Hypertension 10/14/2012  . Type 2 diabetes mellitus   . Diabetic nephropathy with proteinuria   . 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 Diastolic heart failure   . Cellulitis   . Convulsions   . Iron deficiency anemia   . Venous thrombosis and embolism   . Anasarca   . Hypoalbuminemia   . Elevated alkaline phosphatase level   . Microcytic anemia   . GI bleed   . Nephrotic syndrome     reports that he quit smoking about 21 years ago. His smoking use included Cigarettes. He smoked 0.00 packs per day  for 20 years. He has never used smokeless tobacco. He reports that he does not drink alcohol or use illicit drugs. No family history on file.   Living Arrangements: Alone   Abuse/Neglect [facility]) Physical Abuse: Denies Verbal Abuse: Denies Sexual Abuse: Denies Allergies:   Allergies  Allergen Reactions  . Aspirin Itching    Objective: Blood pressure 143/77, pulse 96, temperature 98.9 F (37.2 C), temperature  source Oral, resp. rate 18, height '5\' 6"'  (1.676 m), weight 77.4 kg (170 lb 10.2 oz), SpO2 98.00%.Body mass index is 27.55 kg/(m^2). Results for orders placed during the hospital encounter of 01/30/14 (from the past 72 hour(s))  GLUCOSE, CAPILLARY     Status: Abnormal   Collection Time    02/02/14  4:02 PM      Result Value Ref Range   Glucose-Capillary 172 (*) 70 - 99 mg/dL  GLUCOSE, CAPILLARY     Status: Abnormal   Collection Time    02/02/14 10:02 PM      Result Value Ref Range   Glucose-Capillary 134 (*) 70 - 99 mg/dL  PROTIME-INR     Status: Abnormal   Collection Time    02/03/14  4:20 AM      Result Value Ref Range   Prothrombin Time 17.3 (*) 11.6 - 15.2 seconds   INR 1.45  0.00 - 1.49  CBC     Status: Abnormal   Collection Time    02/03/14  4:20 AM      Result Value Ref Range   WBC 8.4  4.0 - 10.5 K/uL   RBC 3.20 (*) 4.22 - 5.81 MIL/uL   Hemoglobin 7.8 (*) 13.0 - 17.0 g/dL   HCT 24.9 (*) 39.0 - 52.0 %   MCV 77.8 (*) 78.0 - 100.0 fL   MCH 24.4 (*) 26.0 - 34.0 pg   MCHC 31.3  30.0 - 36.0 g/dL   RDW 21.5 (*) 11.5 - 15.5 %   Platelets 376  150 - 400 K/uL  GLUCOSE, CAPILLARY     Status: Abnormal   Collection Time    02/03/14  7:35 AM      Result Value Ref Range   Glucose-Capillary 119 (*) 70 - 99 mg/dL  GLUCOSE, CAPILLARY     Status: Abnormal   Collection Time    02/03/14 11:45 AM      Result Value Ref Range   Glucose-Capillary 184 (*) 70 - 99 mg/dL  GLUCOSE, CAPILLARY     Status: Abnormal   Collection Time    02/03/14  4:49 PM      Result Value Ref Range   Glucose-Capillary 261 (*) 70 - 99 mg/dL  GLUCOSE, CAPILLARY     Status: Abnormal   Collection Time    02/03/14  9:24 PM      Result Value Ref Range   Glucose-Capillary 218 (*) 70 - 99 mg/dL   Comment 1 Notify RN    PROTIME-INR     Status: None   Collection Time    02/04/14  4:57 AM      Result Value Ref Range   Prothrombin Time 15.1  11.6 - 15.2 seconds   INR 1.22  0.00 - 1.49  CBC     Status: Abnormal    Collection Time    02/04/14  4:57 AM      Result Value Ref Range   WBC 9.3  4.0 - 10.5 K/uL   RBC 3.23 (*) 4.22 - 5.81 MIL/uL   Hemoglobin 7.8 (*) 13.0 - 17.0  g/dL   HCT 25.2 (*) 39.0 - 52.0 %   MCV 78.0  78.0 - 100.0 fL   MCH 24.1 (*) 26.0 - 34.0 pg   MCHC 31.0  30.0 - 36.0 g/dL   RDW 21.4 (*) 11.5 - 15.5 %   Platelets 331  150 - 400 K/uL  BASIC METABOLIC PANEL     Status: Abnormal   Collection Time    02/04/14  4:57 AM      Result Value Ref Range   Sodium 142  137 - 147 mEq/L   Potassium 4.0  3.7 - 5.3 mEq/L   Chloride 103  96 - 112 mEq/L   CO2 26  19 - 32 mEq/L   Glucose, Bld 112 (*) 70 - 99 mg/dL   BUN 36 (*) 6 - 23 mg/dL   Creatinine, Ser 1.51 (*) 0.50 - 1.35 mg/dL   Calcium 8.3 (*) 8.4 - 10.5 mg/dL   GFR calc non Af Amer 48 (*) >90 mL/min   GFR calc Af Amer 55 (*) >90 mL/min   Comment: (NOTE)     The eGFR has been calculated using the CKD EPI equation.     This calculation has not been validated in all clinical situations.     eGFR's persistently <90 mL/min signify possible Chronic Kidney     Disease.  GLUCOSE, CAPILLARY     Status: Abnormal   Collection Time    02/04/14  7:37 AM      Result Value Ref Range   Glucose-Capillary 106 (*) 70 - 99 mg/dL   Comment 1 Documented in Chart    GLUCOSE, CAPILLARY     Status: Abnormal   Collection Time    02/04/14 11:19 AM      Result Value Ref Range   Glucose-Capillary 188 (*) 70 - 99 mg/dL   Comment 1 Documented in Chart    GLUCOSE, CAPILLARY     Status: Abnormal   Collection Time    02/04/14  4:52 PM      Result Value Ref Range   Glucose-Capillary 180 (*) 70 - 99 mg/dL   Comment 1 Documented in Chart     Comment 2 Notify RN    GLUCOSE, CAPILLARY     Status: Abnormal   Collection Time    02/04/14  9:28 PM      Result Value Ref Range   Glucose-Capillary 203 (*) 70 - 99 mg/dL   Comment 1 Notify RN    URINALYSIS, ROUTINE W REFLEX MICROSCOPIC     Status: Abnormal   Collection Time    02/04/14  9:31 PM      Result  Value Ref Range   Color, Urine YELLOW  YELLOW   APPearance CLOUDY (*) CLEAR   Specific Gravity, Urine 1.016  1.005 - 1.030   pH 5.0  5.0 - 8.0   Glucose, UA 250 (*) NEGATIVE mg/dL   Hgb urine dipstick NEGATIVE  NEGATIVE   Bilirubin Urine NEGATIVE  NEGATIVE   Ketones, ur NEGATIVE  NEGATIVE mg/dL   Protein, ur 100 (*) NEGATIVE mg/dL   Urobilinogen, UA 0.2  0.0 - 1.0 mg/dL   Nitrite NEGATIVE  NEGATIVE   Leukocytes, UA NEGATIVE  NEGATIVE  URINE MICROSCOPIC-ADD ON     Status: Abnormal   Collection Time    02/04/14  9:31 PM      Result Value Ref Range   Squamous Epithelial / LPF RARE  RARE   WBC, UA 0-2  <3 WBC/hpf   RBC /  HPF 0-2  <3 RBC/hpf   Bacteria, UA FEW (*) RARE   Casts HYALINE CASTS (*) NEGATIVE   Urine-Other RARE YEAST     Comment: AMORPHOUS URATES/PHOSPHATES  PROTIME-INR     Status: Abnormal   Collection Time    02/05/14  5:47 AM      Result Value Ref Range   Prothrombin Time 17.0 (*) 11.6 - 15.2 seconds   INR 1.42  0.00 - 1.49  CBC     Status: Abnormal   Collection Time    02/05/14  5:47 AM      Result Value Ref Range   WBC 7.9  4.0 - 10.5 K/uL   RBC 3.09 (*) 4.22 - 5.81 MIL/uL   Hemoglobin 7.5 (*) 13.0 - 17.0 g/dL   HCT 23.8 (*) 39.0 - 52.0 %   MCV 77.0 (*) 78.0 - 100.0 fL   MCH 24.3 (*) 26.0 - 34.0 pg   MCHC 31.5  30.0 - 36.0 g/dL   RDW 20.9 (*) 11.5 - 15.5 %   Platelets 310  150 - 400 K/uL  GLUCOSE, CAPILLARY     Status: Abnormal   Collection Time    02/05/14  7:30 AM      Result Value Ref Range   Glucose-Capillary 121 (*) 70 - 99 mg/dL   Comment 1 Documented in Chart     Comment 2 Notify RN    GLUCOSE, CAPILLARY     Status: Abnormal   Collection Time    02/05/14 12:02 PM      Result Value Ref Range   Glucose-Capillary 145 (*) 70 - 99 mg/dL   Comment 1 Notify RN     Comment 2 Documented in Chart    CBC WITH DIFFERENTIAL     Status: Abnormal   Collection Time    02/05/14  1:39 PM      Result Value Ref Range   WBC 9.9  4.0 - 10.5 K/uL   RBC 3.46  (*) 4.22 - 5.81 MIL/uL   Hemoglobin 8.4 (*) 13.0 - 17.0 g/dL   HCT 26.8 (*) 39.0 - 52.0 %   MCV 77.5 (*) 78.0 - 100.0 fL   MCH 24.3 (*) 26.0 - 34.0 pg   MCHC 31.3  30.0 - 36.0 g/dL   RDW 20.7 (*) 11.5 - 15.5 %   Platelets 345  150 - 400 K/uL   Neutrophils Relative % 75  43 - 77 %   Neutro Abs 7.4  1.7 - 7.7 K/uL   Lymphocytes Relative 16  12 - 46 %   Lymphs Abs 1.6  0.7 - 4.0 K/uL   Monocytes Relative 5  3 - 12 %   Monocytes Absolute 0.5  0.1 - 1.0 K/uL   Eosinophils Relative 4  0 - 5 %   Eosinophils Absolute 0.4  0.0 - 0.7 K/uL   Basophils Relative 0  0 - 1 %   Basophils Absolute 0.0  0.0 - 0.1 K/uL   Labs are reviewed.  Current Facility-Administered Medications  Medication Dose Route Frequency Provider Last Rate Last Dose  . 0.9 %  sodium chloride infusion   Intravenous Continuous [name] Jan, MD   10 mL/hr at 01/31/14 0046  . amLODipine (NORVASC) tablet 10 mg  10 mg Oral Daily Sueanne Margarita, MD   10 mg at 02/05/14 1332  . cloNIDine (CATAPRES) tablet 0.1 mg  0.1 mg Oral TID Doree Fudge, MD   0.1 mg at 02/05/14 1111  . enoxaparin (LOVENOX) injection  75 mg  75 mg Subcutaneous Q12H Ace Gins, RPH   75 mg at 02/05/14 0502  . FLUoxetine (PROZAC) capsule 10 mg  10 mg Oral Daily Durward Parcel, MD      . furosemide (LASIX) injection 40 mg  40 mg Intravenous Q12H Doree Fudge, MD   40 mg at 02/05/14 1110  . hydrALAZINE (APRESOLINE) injection 10-40 mg  10-40 mg Intravenous Q4H PRN Allie Bossier, MD   20 mg at 02/03/14 1240  . hydrALAZINE (APRESOLINE) tablet 50 mg  50 mg Oral 3 times per day Doree Fudge, MD   50 mg at 02/05/14 0502  . insulin aspart (novoLOG) injection 0-15 Units  0-15 Units Subcutaneous TID WC Raylene Miyamoto, MD   2 Units at 02/05/14 1346  . isosorbide mononitrate (IMDUR) 24 hr tablet 30 mg  30 mg Oral Daily Doree Fudge, MD   30 mg at 02/05/14 1333  . metolazone (ZAROXOLYN) tablet 5 mg  5 mg Oral BID  Allie Bossier, MD   5 mg at 02/05/14 1110  . warfarin (COUMADIN) tablet 6 mg  6 mg Oral ONCE-1800 Patsey Berthold [place], [facility]      . Warfarin - Pharmacist Dosing Inpatient   Does not apply [place], [facility]        Psychiatric Specialty Exam: Physical Exam Full physical performed in Emergency Department. I have reviewed this assessment and concur with its findings.   Review of Systems  Psychiatric/Behavioral: Positive for depression and suicidal ideas. The patient is nervous/anxious and has insomnia.     Blood pressure 143/77, pulse 96, temperature 98.9 F (37.2 C), temperature source Oral, resp. rate 18, height '5\' 6"'  (1.676 m), weight 77.4 kg (170 lb 10.2 oz), SpO2 98.00%.Body mass index is 27.55 kg/(m^2).  General Appearance: Disheveled and Guarded  Eye Sport and exercise psychologist::  Fair  Speech:  Clear and Coherent and Slow  Volume:  Decreased  Mood:  Depressed, Dysphoric, Hopeless and Worthless  Affect:  Depressed and Flat  Thought Process:  Goal Directed and Intact  Orientation:  Full (Time, Place, and Person)  Thought Content:  Rumination  Suicidal Thoughts:  Yes.  without intent/plan  Homicidal Thoughts:  No  Memory:  Immediate;   Fair Recent;   Fair  Judgement:  Intact  Insight:  Fair  Psychomotor Activity:  Psychomotor Retardation  Concentration:  Fair  Recall:  Smiley Houseman of Knowledge:Fair  Language: Good  Akathisia:  NA  Handed:  Right  AIMS (if indicated):     Assets:  Communication Skills Desire for Improvement Financial Resources/Insurance Housing Intimacy Leisure Time Resilience Social Support Talents/Skills  Sleep:      Musculoskeletal: Strength & Muscle Tone: within normal limits Gait & Station: unable to stand Patient leans: N/A  Treatment Plan Summary: Daily contact with patient to assess and evaluate symptoms and progress in treatment Medication management  Zaylia Riolo,JANARDHAHA R. 02/05/2014 2:25 PM

## 2014-02-05 NOTE — Progress Notes (Signed)
[facility] PROGRESS NOTE  Joshua [name] [id number] DOB: Feb 25, 1951 DOA: 01/30/2014 PCP: Duwaine Maxin, DO  Assessment/Plan:  Asystole -Resolved, will avoid all AV nodal blocking agent  Tachybradycardia syndrome -See asystole  Chronic diastolic CHF -Patient unsure of dry weight, however currently anasarca -Continue Norvasc 10 mg daily -Continue clonidine tablet 0.1 mg TID -Continue hydralazine 50 mg TID -Continue Imdur 30 mg daily -Patient seriously fluid overloaded increase Lasix to 60 mg BID; start Zaroxolyn 5 mg BID -3/9 highest admission weight =78.4 kg,  3/14 weight= 77.4 kg (bed); NOTE reported NET fluid balance on 3/14= -5.6 L  HTN -See diastolic CHF  Seizure -Currently not on seizure medication, patient unsure of any previous medication and cause. -Speak with nephew in the a.m.  Nephrotic syndrome -Obtain urinalysis  Diabetes type 2 with retinopathy/neuropathy -Hemoglobin A1c on 11/26/2013= 6.5; within ADA guidelines -Continue moderate SSI  HLD -Although patient's LDL greater than ADA goal of70mg /dL, his extremely high HDL of 83 mg/dL cancels out his mildly elevated LDL  Suicidal ideation -Patient expressed to PT [place] I just want to die" "just kill me -Per psychiatry No evidence of imminent risk to self or others at present.  D/C sitter  -Patient does not meet criteria for psychiatric inpatient admission.  -Per psychiatry start Fluoxetine 10 mg PO QD for depression  DVT popliteal vein -Continue Coumadin per pharmacy      Code Status: Full Family Communication: None available Disposition Plan: Resolution of decompensated diastolic CHF     Consultants: Dr.JONNALAGADDA,JANARDHAHA R. (psychiatry)    Procedures: Echocardiogram 01/31/2014 - Left ventricle: The cavity size was normal. Wall thickness was normal. Systolic function was normal. -LVEF= 55% to 60%.Systolic function was mildly reduced. - Aortic valve: Trivial  regurgitation. - Left atrium: The atrium was mildly dilated. - Right ventricle: The cavity size was mildly dilated. - Right atrium: The atrium was moderately dilated. - Atrial septum: No defect or patent foramen ovale was identified.    Antibiotics:    HPI/Subjective: [name] PMHx  DM type 2 with proliferative retinopathy/neuropathy, CKD STAGE III, HTN, diastolic CHF, Hx tuberculosis, Hx seizures, nephrotic syndrome, Hx DVT popliteal vein. 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 Patient was previously admitted to [facility] on [id number] which was complicated by bradycardia/hypotension, and seizure while in the ICU. Patient was intubated 2/13-2/14. Follow have bilateral peroneal DVTs, bilateral pleural effusions (negative for PE), acute acalculous cholecystitis. Patient admitted on 01/30/2014 Patient undergoing outpatient kub due to diarrhea, abdominal distention (from rehab center) when he had asystole/PEA arrest with ROSC in 14 minutes. 3/8 weight= 75.3 kg 3/13 patient states still having some SOB , negative CP, positive abdominal and lower extremity swelling. Patient unsure of his dry weight. Patient on 3 L O2 via [place] SpO2= 99%. Patient states does not use home O2 3/14 states still DOE, negative suicidal or homicidal ideation, negative N./V.     Objective: Filed Vitals:   02/04/14 2120 02/05/14 0500 02/05/14 0529 02/05/14 1111  BP: 151/88  136/74 136/74  Pulse: 89  83   Temp: 98.5 F (36.9 C)  99.1 F (37.3 C)   TempSrc: Oral  Oral   Resp: 20  19   Height:      Weight:  77.4 kg (170 lb 10.2 oz)    SpO2: 97%  97%     Intake/Output Summary (Last 24 hours) at 02/05/14 1212 Last data filed at 02/05/14 1150  Gross per 24 hour  Intake    240 ml  Output   3500 ml  Net  -3260 ml   Filed Weights   02/02/14 0500 02/03/14 0500 02/05/14 0500  Weight: 77.3 kg (170 lb 6.7 oz) 75.3 kg (166 lb 0.1 oz) 77.4 kg (170 lb 10.2 oz)    Exam:   General:   A./O. x4, NAD  Cardiovascular: Rhythm and rate, negative murmurs rubs or gallops  Respiratory: Clear to auscultation bilateral   Abdomen: Distended abdomen, nontender, tense, plus bowel sounds (anasarca)  Psychiatric ` patient denies suicidal or homicidal ideation. States had suicidal ideations from 2/13-3/13   Musculoskeletal: Pedal edema to 2-3+ to hips bilateral    Data Reviewed: Basic Metabolic Panel:  Recent Labs Lab 01/30/14 2028 01/31/14 0330 01/31/14 0410 02/01/14 0400 02/02/14 0440 02/02/14 1228 02/04/14 0457  NA 143 145 144 145 140  --  142  K 4.1 3.9 3.9 4.2 4.2  --  4.0  CL 108 107 107 108 103  --  103  CO2 26 24 26 25 26   --  26  GLUCOSE 162* 88 88 113* 160*  --  112*  BUN 59* 54* 53* 43* 44*  --  36*  CREATININE 1.78* 1.61* 1.57* 1.59* 1.68* 1.54* 1.51*  CALCIUM 8.9 8.8 8.8 8.7 8.5  --  8.3*  MG 2.1  --   --   --   --   --   --   PHOS 3.9 3.2  --   --   --   --   --    Liver Function Tests:  Recent Labs Lab 01/30/14 1430 01/30/14 2028 01/31/14 0330 01/31/14 0410  AST 41* 42*  --  31  ALT 59* 59*  --  54*  ALKPHOS 526* 449*  --  438*  BILITOT 0.3 0.3  --  0.3  PROT 7.0 6.4  --  6.4  ALBUMIN 1.9* 1.7*  1.7* 1.7* 1.6*   No results found for this basename: LIPASE, AMYLASE,  in the last 168 hours No results found for this basename: AMMONIA,  in the last 168 hours CBC:  Recent Labs Lab 01/30/14 1430  02/02/14 0440 02/02/14 1228 02/03/14 0420 02/04/14 0457 02/05/14 0547  WBC 7.2  < > 9.9 9.0 8.4 9.3 7.9  NEUTROABS 5.4  --   --   --   --   --   --   HGB 7.5*  < > 8.3* 8.2* 7.8* 7.8* 7.5*  HCT 24.0*  < > 26.7* 26.1* 24.9* 25.2* 23.8*  MCV 79.7  < > 78.3 78.4 77.8* 78.0 77.0*  PLT 334  < > 436* 394 376 331 310  < > = values in this interval not displayed. Cardiac Enzymes:  Recent Labs Lab 01/30/14 2028 01/31/14 0108 01/31/14 0630  TROPONINI <0.30 <0.30 <0.30   BNP (last 3 results)  Recent Labs  01/30/14 2028  PROBNP 9165.0*    CBG:  Recent Labs Lab 02/04/14 0737 02/04/14 1119 02/04/14 1652 02/04/14 2128 02/05/14 0730  GLUCAP 106* 188* 180* 203* 121*    Recent Results (from the past 240 hour(s))  MRSA PCR SCREENING     Status: None   Collection Time    01/30/14  6:31 PM      Result Value Ref Range Status   MRSA by PCR NEGATIVE  NEGATIVE Final   Comment:            The GeneXpert MRSA Assay (FDA     approved for NASAL specimens  only), is one component of a     comprehensive MRSA colonization     surveillance program. It is not     intended to diagnose MRSA     infection nor to guide or     monitor treatment for     MRSA infections.  CULTURE, BLOOD (ROUTINE X 2)     Status: None   Collection Time    01/30/14  8:28 PM      Result Value Ref Range Status   Specimen Description BLOOD A-LINE   Final   Special Requests BOTTLES DRAWN AEROBIC AND ANAEROBIC 10CC EACH   Final   Culture  Setup Time     Final   Value: 01/31/2014 08:44     Performed at [facility]   Culture     Final   Value:        BLOOD CULTURE RECEIVED NO GROWTH TO DATE CULTURE WILL BE HELD FOR 5 DAYS BEFORE ISSUING A FINAL NEGATIVE REPORT     Performed at [facility]   Report Status PENDING   Incomplete  CULTURE, BLOOD (ROUTINE X 2)     Status: None   Collection Time    01/30/14  8:46 PM      Result Value Ref Range Status   Specimen Description BLOOD CENTRAL LINE   Final   Special Requests BOTTLES DRAWN AEROBIC AND ANAEROBIC 10CC EACH   Final   Culture  Setup Time     Final   Value: 01/31/2014 08:46     Performed at [facility]   Culture     Final   Value:        BLOOD CULTURE RECEIVED NO GROWTH TO DATE CULTURE WILL BE HELD FOR 5 DAYS BEFORE ISSUING A FINAL NEGATIVE REPORT     Performed at [facility]   Report Status PENDING   Incomplete     Studies: No results found.  Scheduled Meds: . amLODipine  10 mg Oral Daily  . cloNIDine  0.1 mg Oral TID  . enoxaparin (LOVENOX) injection   75 mg Subcutaneous Q12H  . furosemide  40 mg Intravenous Q12H  . hydrALAZINE  50 mg Oral 3 times per day  . insulin aspart  0-15 Units Subcutaneous TID WC  . isosorbide mononitrate  30 mg Oral Daily  . metolazone  5 mg Oral BID  . warfarin  6 mg Oral ONCE-1800  . Warfarin - Pharmacist Dosing Inpatient   Does not apply q1800   Continuous Infusions: . sodium chloride Stopped (02/03/14 2256)    Active Problems:   Pulmonary tuberculosis   Hypertension   Diabetic neuropathy   New onset seizure   Diabetic proliferative retinopathy   DVT of popliteal vein   Acute on chronic diastolic heart failure   Nephrotic syndrome   Cardiac arrest   Suicide ideation    Time spent:60 minute    Jaiyah Beining, J  [facility] Pager [phone]. If 7PM-7AM, please contact night-coverage at [email or web address], password [facility] 02/05/2014, 12:12 PM  LOS: 6 days

## 2014-02-06 ENCOUNTER — Inpatient Hospital Stay (HOSPITAL_COMMUNITY): Payer: PRIVATE HEALTH INSURANCE

## 2014-02-06 LAB — CULTURE, BLOOD (ROUTINE X 2)
Culture: NO GROWTH
Culture: NO GROWTH

## 2014-02-06 LAB — GLUCOSE, CAPILLARY
GLUCOSE-CAPILLARY: 196 mg/dL — AB (ref 70–99)
GLUCOSE-CAPILLARY: 220 mg/dL — AB (ref 70–99)
GLUCOSE-CAPILLARY: 181 mg/dL — AB (ref 70–99)
Glucose-Capillary: 155 mg/dL — ABNORMAL HIGH (ref 70–99)

## 2014-02-06 LAB — CBC WITH DIFFERENTIAL/PLATELET
BASOS PCT: 0 % (ref 0–1)
Basophils Absolute: 0 10*3/uL (ref 0.0–0.1)
EOS ABS: 0.4 10*3/uL (ref 0.0–0.7)
EOS PCT: 4 % (ref 0–5)
HCT: 24.3 % — ABNORMAL LOW (ref 39.0–52.0)
HEMOGLOBIN: 7.7 g/dL — AB (ref 13.0–17.0)
LYMPHS ABS: 1.6 10*3/uL (ref 0.7–4.0)
Lymphocytes Relative: 20 % (ref 12–46)
MCH: 24.5 pg — ABNORMAL LOW (ref 26.0–34.0)
MCHC: 31.7 g/dL (ref 30.0–36.0)
MCV: 77.4 fL — AB (ref 78.0–100.0)
MONOS PCT: 8 % (ref 3–12)
Monocytes Absolute: 0.6 10*3/uL (ref 0.1–1.0)
Neutro Abs: 5.4 10*3/uL (ref 1.7–7.7)
Neutrophils Relative %: 68 % (ref 43–77)
Platelets: 294 10*3/uL (ref 150–400)
RBC: 3.14 MIL/uL — AB (ref 4.22–5.81)
RDW: 20.8 % — ABNORMAL HIGH (ref 11.5–15.5)
WBC: 8 10*3/uL (ref 4.0–10.5)

## 2014-02-06 LAB — PROTIME-INR
INR: 1.36 (ref 0.00–1.49)
PROTHROMBIN TIME: 16.4 s — AB (ref 11.6–15.2)

## 2014-02-06 LAB — COMPREHENSIVE METABOLIC PANEL
ALBUMIN: 1.9 g/dL — AB (ref 3.5–5.2)
ALT: 24 U/L (ref 0–53)
AST: 18 U/L (ref 0–37)
Alkaline Phosphatase: 287 U/L — ABNORMAL HIGH (ref 39–117)
BILIRUBIN TOTAL: 0.3 mg/dL (ref 0.3–1.2)
BUN: 36 mg/dL — ABNORMAL HIGH (ref 6–23)
CALCIUM: 8.5 mg/dL (ref 8.4–10.5)
CHLORIDE: 99 meq/L (ref 96–112)
CO2: 31 meq/L (ref 19–32)
CREATININE: 1.6 mg/dL — AB (ref 0.50–1.35)
GFR calc Af Amer: 52 mL/min — ABNORMAL LOW (ref >90)
GFR, EST NON AFRICAN AMERICAN: 45 mL/min — AB (ref >90)
Glucose, Bld: 133 mg/dL — ABNORMAL HIGH (ref 70–99)
Potassium: 3.8 mEq/L (ref 3.7–5.3)
SODIUM: 139 meq/L (ref 137–147)
Total Protein: 6.4 g/dL (ref 6.0–8.3)

## 2014-02-06 LAB — MAGNESIUM: Magnesium: 1.7 mg/dL (ref 1.5–2.5)

## 2014-02-06 MED ORDER — FUROSEMIDE 10 MG/ML IJ SOLN
40.0000 mg | Freq: Every day | INTRAMUSCULAR | Status: DC
  Administered 2014-02-07: 40 mg via INTRAVENOUS
  Filled 2014-02-06: qty 4

## 2014-02-06 MED ORDER — WARFARIN SODIUM 7.5 MG PO TABS
7.5000 mg | ORAL_TABLET | Freq: Once | ORAL | Status: AC
  Administered 2014-02-06: 7.5 mg via ORAL
  Filled 2014-02-06: qty 1

## 2014-02-06 NOTE — Progress Notes (Signed)
ANTICOAGULATION CONSULT NOTE - Follow Up Consult  Pharmacy Consult for Coumadin Indication: Recent bilateral DVT  Allergies  Allergen Reactions  . Aspirin Itching    Patient Measurements: Height: 5\' 6"  (167.6 cm) Weight: 162 lb 11.2 oz (73.8 kg) IBW/kg (Calculated) : 63.8 Heparin Dosing Weight:   Vital Signs: Temp: 99.5 F (37.5 C) (03/15 0500) Temp src: Oral (03/15 0500) BP: 129/82 mmHg (03/15 0500) Pulse Rate: 84 (03/15 0500)  Labs:  Recent Labs  02/04/14 0457 02/05/14 0547 02/05/14 1339 02/06/14 0500  HGB 7.8* 7.5* 8.4* 7.7*  HCT 25.2* 23.8* 26.8* 24.3*  PLT 331 310 345 294  LABPROT 15.1 17.0*  --  16.4*  INR 1.22 1.42  --  1.36  CREATININE 1.51*  --  1.49* 1.60*    Estimated Creatinine Clearance: 43.2 ml/min (by C-G formula based on Cr of 1.6).  Assessment: 62yom on Lovenox bridging to Coumadin for recent DVT (2/15). INR (1.36) is subtherapeutic and is not responding as expected. Patient was admitted with an elevated INR of 4.68 on PTA regimen of alternating Coumadin doses of 7.5mg  and 5mg  daily. Patient did receive several doses of Vitamin K which may be contributing to the resistant INR. Will plan to increase Coumadin dose and continue Lovenox until INR is therapeutic. - H/H low but stable, Plts wnl - No significant bleeding reported - Noted the addition of Fluoxetine which may increase INR when used with Coumadin   Goal of Therapy:  INR 2-3 Monitor platelets by anticoagulation protocol: Yes   Plan:  1. Coumadin 7.5mg  PO x 1 today 2. Continue Lovenox 75mg  SQ q12h  3. Follow-up AM INR and CBC   Earleen Newport [id number] 02/06/2014,8:59 AM

## 2014-02-06 NOTE — Progress Notes (Signed)
Patient refused to ambulate in hallway. He says he is too week to walk; encouraged numerous times but continues to say "No".

## 2014-02-06 NOTE — Progress Notes (Signed)
[facility] PROGRESS NOTE  Joshua [name] [id number] DOB: [birth year] DOA: 01/30/2014 PCP: Duwaine Maxin, DO  Assessment/Plan:  Asystole -Continue to avoid all AV nodal blocking agent  Tachybradycardia syndrome -See asystole  Chronic diastolic CHF -Patient unsure of dry weight, however currently anasarca -Continue Norvasc 10 mg daily -Continue clonidine tablet 0.1 mg TID -Continue hydralazine 50 mg TID -Continue Imdur 30 mg daily -3/15 Patient creatinine trending up will decrease Lasix to 40 mg daily; DC Zaroxolyn 5 mg BID -3/9 highest admission weight =78.4 kg,  3/14 weight= 77.4 kg (bed), 3/15 weight= 73.8 kg (bed); NOTE reported NET fluid balance on 3/15= -8.2 L -3/15 Counseled patient will obtain a chem appropriate SpO2 today on room air -3/15 obtain orthostatic vitals; counseled patient he must cooperate in order for [place] to properly treat -  HTN -See diastolic CHF  Seizure -Currently not on seizure medication, patient unsure of any previous medication and cause.  Nephrotic syndrome -Random urinalysis shows 100mg /dL protein -Obtain spot protein/creatinine ratio  Diabetes type 2 with retinopathy/neuropathy -Hemoglobin A1c on 11/26/2013= 6.5; within ADA guidelines -Continue moderate SSI  HLD -Although patient's LDL greater than ADA goal of70mg /dL, his extremely high HDL of 83 mg/dL cancels out his mildly elevated LDL  Suicidal ideation -Patient expressed to PT [place] I just want to die" "just kill me -Per psychiatry No evidence of imminent risk to self or others at present.  D/C sitter  -Patient does not meet criteria for psychiatric inpatient admission.  -Per psychiatry start Fluoxetine 10 mg PO QD for depression  DVT popliteal vein -Continue Coumadin per pharmacy  Dysphasia? -Have speech conduct bedside swallow      Code Status: Full Family Communication: None available Disposition Plan: Resolution of decompensated diastolic  CHF     Consultants: Dr.JONNALAGADDA,JANARDHAHA R. (psychiatry)    Procedures: PCXR 02/06/2014 1. Overall, improved pulmonary aeration compared to 02/02/2014 likely  from an interval decrease in interstitial pulmonary edema.  2. Persistently low inspiratory volumes with small bilateral pleural  effusions and bibasilar atelectasis. Superimposed infiltrate is  difficult to exclude radiographically.  PCXR 02/02/2014 Hypoaeration. Central vascular congestion and interstitial and hazy  airspace opacities may reflect edema (favored) versus multifocal  infection.  Small effusions and bibasilar opacities, favor atelectasis.   Echocardiogram 01/31/2014 - Left ventricle: The cavity size was normal. Wall thickness was normal. Systolic function was normal. -LVEF= 55% to 60%.Systolic function was mildly reduced. - Aortic valve: Trivial regurgitation. - Left atrium: The atrium was mildly dilated. - Right ventricle: The cavity size was mildly dilated. - Right atrium: The atrium was moderately dilated. - Atrial septum: No defect or patent foramen ovale was identified.    Antibiotics:    HPI/Subjective: [name] PMHx  DM type 2 with proliferative retinopathy/neuropathy, CKD STAGE III, HTN, diastolic CHF, Hx tuberculosis, Hx seizures, nephrotic syndrome, Hx DVT popliteal vein. 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 Patient was previously admitted to [facility] on [id number] which was complicated by bradycardia/hypotension, and seizure while in the ICU. Patient was intubated 2/13-2/14. Follow have bilateral peroneal DVTs, bilateral pleural effusions (negative for PE), acute acalculous cholecystitis. Patient admitted on 01/30/2014 Patient undergoing outpatient kub due to diarrhea, abdominal distention (from rehab center) when he had asystole/PEA arrest with ROSC in 14 minutes. 3/8 weight= 75.3 kg 3/13 patient states still having some SOB , negative CP, positive  abdominal and lower extremity swelling. Patient unsure of his dry weight. Patient on 3 L O2  via [place] SpO2= 99%. Patient states does not use home O2 3/14 states still DOE, negative suicidal or homicidal ideation, negative N./V. 3/15 states his work of breathing is improved, but has not been ambulating yet. Negative CP, negative SOB, negative. N./V. Per patient RN he has complained of some difficulty with eating and swallowing food.      Objective: Filed Vitals:   02/05/14 1111 02/05/14 1400 02/05/14 2022 02/06/14 0500  BP: 136/74 143/77 126/68 129/82  Pulse:  96 81 84  Temp:  98.9 F (37.2 C)  99.5 F (37.5 C)  TempSrc:  Oral  Oral  Resp:  18 19 20   Height:      Weight:    73.8 kg (162 lb 11.2 oz)  SpO2:  98% 99% 96%    Intake/Output Summary (Last 24 hours) at 02/06/14 0835 Last data filed at 02/06/14 0500  Gross per 24 hour  Intake    600 ml  Output   3600 ml  Net  -3000 ml   Filed Weights   02/03/14 0500 02/05/14 0500 02/06/14 0500  Weight: 75.3 kg (166 lb 0.1 oz) 77.4 kg (170 lb 10.2 oz) 73.8 kg (162 lb 11.2 oz)    Exam:   General:  A./O. x4, NAD  Cardiovascular: Rhythm and rate, negative murmurs rubs or gallops  Respiratory: Clear to auscultation bilateral   Abdomen: Distended abdomen (improved), nontender, tense, plus bowel sounds (anasarca improved from 3/14)  Psychiatric ` patient denies suicidal or homicidal ideation. States had suicidal ideations from 2/13-3/13   Musculoskeletal: Pedal edema to 2-3+ to hips bilateral    Data Reviewed: Basic Metabolic Panel:  Recent Labs Lab 01/30/14 2028 01/31/14 0330  02/01/14 0400 02/02/14 0440 02/02/14 1228 02/04/14 0457 02/05/14 1339 02/06/14 0500  NA 143 145  < > 145 140  --  142 139 139  K 4.1 3.9  < > 4.2 4.2  --  4.0 3.7 3.8  CL 108 107  < > 108 103  --  103 99 99  CO2 26 24  < > 25 26  --  26 30 31   GLUCOSE 162* 88  < > 113* 160*  --  112* 224* 133*  BUN 59* 54*  < > 43* 44*  --  36* 32* 36*  CREATININE  1.78* 1.61*  < > 1.59* 1.68* 1.54* 1.51* 1.49* 1.60*  CALCIUM 8.9 8.8  < > 8.7 8.5  --  8.3* 8.5 8.5  MG 2.1  --   --   --   --   --   --  1.8 1.7  PHOS 3.9 3.2  --   --   --   --   --   --   --   < > = values in this interval not displayed. Liver Function Tests:  Recent Labs Lab 01/30/14 1430 01/30/14 2028 01/31/14 0330 01/31/14 0410 02/05/14 1339 02/06/14 0500  AST 41* 42*  --  31 22 18   ALT 59* 59*  --  54* 29 24  ALKPHOS 526* 449*  --  438* 323* 287*  BILITOT 0.3 0.3  --  0.3 0.3 0.3  PROT 7.0 6.4  --  6.4 6.9 6.4  ALBUMIN 1.9* 1.7*  1.7* 1.7* 1.6* 1.9* 1.9*   No results found for this basename: LIPASE, AMYLASE,  in the last 168 hours No results found for this basename: AMMONIA,  in the last 168 hours CBC:  Recent Labs Lab 01/30/14 1430  02/03/14 0420 02/04/14 0457 02/05/14 0547  02/05/14 1339 02/06/14 0500  WBC 7.2  < > 8.4 9.3 7.9 9.9 8.0  NEUTROABS 5.4  --   --   --   --  7.4 5.4  HGB 7.5*  < > 7.8* 7.8* 7.5* 8.4* 7.7*  HCT 24.0*  < > 24.9* 25.2* 23.8* 26.8* 24.3*  MCV 79.7  < > 77.8* 78.0 77.0* 77.5* 77.4*  PLT 334  < > 376 331 310 345 294  < > = values in this interval not displayed. Cardiac Enzymes:  Recent Labs Lab 01/30/14 2028 01/31/14 0108 01/31/14 0630  TROPONINI <0.30 <0.30 <0.30   BNP (last 3 results)  Recent Labs  01/30/14 2028  PROBNP 9165.0*   CBG:  Recent Labs Lab 02/05/14 0730 02/05/14 1202 02/05/14 1637 02/05/14 2010 02/06/14 0733  GLUCAP 121* 145* 208* 142* 155*    Recent Results (from the past 240 hour(s))  MRSA PCR SCREENING     Status: None   Collection Time    01/30/14  6:31 PM      Result Value Ref Range Status   MRSA by PCR NEGATIVE  NEGATIVE Final   Comment:            The GeneXpert MRSA Assay (FDA     approved for NASAL specimens     only), is one component of a     comprehensive MRSA colonization     surveillance program. It is not     intended to diagnose MRSA     infection nor to guide or     monitor  treatment for     MRSA infections.  CULTURE, BLOOD (ROUTINE X 2)     Status: None   Collection Time    01/30/14  8:28 PM      Result Value Ref Range Status   Specimen Description BLOOD A-LINE   Final   Special Requests BOTTLES DRAWN AEROBIC AND ANAEROBIC 10CC EACH   Final   Culture  Setup Time     Final   Value: 01/31/2014 08:44     Performed at [facility]   Culture     Final   Value:        BLOOD CULTURE RECEIVED NO GROWTH TO DATE CULTURE WILL BE HELD FOR 5 DAYS BEFORE ISSUING A FINAL NEGATIVE REPORT     Performed at [facility]   Report Status PENDING   Incomplete  CULTURE, BLOOD (ROUTINE X 2)     Status: None   Collection Time    01/30/14  8:46 PM      Result Value Ref Range Status   Specimen Description BLOOD CENTRAL LINE   Final   Special Requests BOTTLES DRAWN AEROBIC AND ANAEROBIC 10CC EACH   Final   Culture  Setup Time     Final   Value: 01/31/2014 08:46     Performed at [facility]   Culture     Final   Value:        BLOOD CULTURE RECEIVED NO GROWTH TO DATE CULTURE WILL BE HELD FOR 5 DAYS BEFORE ISSUING A FINAL NEGATIVE REPORT     Performed at [facility]   Report Status PENDING   Incomplete     Studies: No results found.  Scheduled Meds: . amLODipine  10 mg Oral Daily  . cloNIDine  0.1 mg Oral TID  . enoxaparin (LOVENOX) injection  75 mg Subcutaneous Q12H  . FLUoxetine  10 mg Oral Daily  . furosemide  40 mg Intravenous  Q12H  . hydrALAZINE  50 mg Oral 3 times per day  . insulin aspart  0-15 Units Subcutaneous TID WC  . isosorbide mononitrate  30 mg Oral Daily  . metolazone  5 mg Oral BID  . Warfarin - Pharmacist Dosing Inpatient   Does not apply q1800   Continuous Infusions: . sodium chloride Stopped (02/03/14 2256)    Active Problems:   Pulmonary tuberculosis   Hypertension   Diabetic neuropathy   New onset seizure   Diabetic proliferative retinopathy   DVT of popliteal vein   Acute on chronic diastolic heart  failure   Nephrotic syndrome   Cardiac arrest   Suicide ideation    Time spent:40 minute    Jaivyn Gulla, J  [facility] Pager [phone]. If 7PM-7AM, please contact night-coverage at [email or web address], password [facility] 02/06/2014, 8:35 AM  LOS: 7 days

## 2014-02-07 ENCOUNTER — Encounter: Payer: Self-pay | Admitting: *Deleted

## 2014-02-07 LAB — CBC WITH DIFFERENTIAL/PLATELET
Basophils Absolute: 0 10*3/uL (ref 0.0–0.1)
Basophils Relative: 0 % (ref 0–1)
EOS PCT: 5 % (ref 0–5)
Eosinophils Absolute: 0.4 10*3/uL (ref 0.0–0.7)
HCT: 26 % — ABNORMAL LOW (ref 39.0–52.0)
HEMOGLOBIN: 8.1 g/dL — AB (ref 13.0–17.0)
LYMPHS ABS: 1.1 10*3/uL (ref 0.7–4.0)
Lymphocytes Relative: 15 % (ref 12–46)
MCH: 24.1 pg — ABNORMAL LOW (ref 26.0–34.0)
MCHC: 31.2 g/dL (ref 30.0–36.0)
MCV: 77.4 fL — AB (ref 78.0–100.0)
MONOS PCT: 7 % (ref 3–12)
Monocytes Absolute: 0.5 10*3/uL (ref 0.1–1.0)
Neutro Abs: 5.7 10*3/uL (ref 1.7–7.7)
Neutrophils Relative %: 74 % (ref 43–77)
Platelets: 303 10*3/uL (ref 150–400)
RBC: 3.36 MIL/uL — AB (ref 4.22–5.81)
RDW: 20.6 % — ABNORMAL HIGH (ref 11.5–15.5)
WBC: 7.8 10*3/uL (ref 4.0–10.5)

## 2014-02-07 LAB — GLUCOSE, CAPILLARY
GLUCOSE-CAPILLARY: 188 mg/dL — AB (ref 70–99)
Glucose-Capillary: 162 mg/dL — ABNORMAL HIGH (ref 70–99)
Glucose-Capillary: 187 mg/dL — ABNORMAL HIGH (ref 70–99)
Glucose-Capillary: 166 mg/dL — ABNORMAL HIGH (ref 70–99)

## 2014-02-07 LAB — PROTEIN / CREATININE RATIO, URINE
Creatinine, Urine: 53.83 mg/dL
Protein Creatinine Ratio: 3.67 — ABNORMAL HIGH (ref 0.00–0.15)
TOTAL PROTEIN, URINE: 197.4 mg/dL

## 2014-02-07 LAB — COMPREHENSIVE METABOLIC PANEL
ALBUMIN: 1.9 g/dL — AB (ref 3.5–5.2)
ALT: 29 U/L (ref 0–53)
AST: 32 U/L (ref 0–37)
Alkaline Phosphatase: 319 U/L — ABNORMAL HIGH (ref 39–117)
BUN: 37 mg/dL — AB (ref 6–23)
CALCIUM: 8.5 mg/dL (ref 8.4–10.5)
CO2: 33 mEq/L — ABNORMAL HIGH (ref 19–32)
CREATININE: 1.43 mg/dL — AB (ref 0.50–1.35)
Chloride: 96 mEq/L (ref 96–112)
GFR calc Af Amer: 59 mL/min — ABNORMAL LOW (ref >90)
GFR calc non Af Amer: 51 mL/min — ABNORMAL LOW (ref >90)
Glucose, Bld: 169 mg/dL — ABNORMAL HIGH (ref 70–99)
Potassium: 4.2 mEq/L (ref 3.7–5.3)
Sodium: 137 mEq/L (ref 137–147)
Total Bilirubin: 0.3 mg/dL (ref 0.3–1.2)
Total Protein: 6.5 g/dL (ref 6.0–8.3)

## 2014-02-07 LAB — MAGNESIUM: Magnesium: 1.7 mg/dL (ref 1.5–2.5)

## 2014-02-07 LAB — IRON AND TIBC
IRON: 30 ug/dL — AB (ref 42–135)
SATURATION RATIOS: 11 % — AB (ref 20–55)
TIBC: 285 ug/dL (ref 215–435)
UIBC: 255 ug/dL (ref 125–400)

## 2014-02-07 LAB — FERRITIN: Ferritin: 50 ng/mL (ref 22–322)

## 2014-02-07 LAB — PHOSPHORUS: Phosphorus: 3.6 mg/dL (ref 2.3–4.6)

## 2014-02-07 LAB — PROTIME-INR
INR: 1.48 (ref 0.00–1.49)
PROTHROMBIN TIME: 17.5 s — AB (ref 11.6–15.2)

## 2014-02-07 MED ORDER — FUROSEMIDE 10 MG/ML IJ SOLN
20.0000 mg | Freq: Once | INTRAMUSCULAR | Status: AC
  Administered 2014-02-07: 20 mg via INTRAVENOUS

## 2014-02-07 MED ORDER — FUROSEMIDE 10 MG/ML IJ SOLN: 60.0000 mg | Freq: Two times a day (BID) | INTRAMUSCULAR | Status: DC

## 2014-02-07 MED ORDER — FUROSEMIDE 10 MG/ML IJ SOLN: 40.0000 mg | Freq: Two times a day (BID) | INTRAMUSCULAR | Status: DC

## 2014-02-07 MED ORDER — WARFARIN SODIUM 7.5 MG PO TABS
7.5000 mg | ORAL_TABLET | Freq: Once | ORAL | Status: AC
  Administered 2014-02-07: 7.5 mg via ORAL
  Filled 2014-02-07: qty 1

## 2014-02-07 MED ORDER — FUROSEMIDE 10 MG/ML IJ SOLN
40.0000 mg | Freq: Three times a day (TID) | INTRAMUSCULAR | Status: DC
  Administered 2014-02-07 – 2014-02-10 (×11): 40 mg via INTRAVENOUS
  Filled 2014-02-07 (×15): qty 4

## 2014-02-07 NOTE — Progress Notes (Signed)
  SATURATION QUALIFICATIONS: (This note is used to comply with regulatory documentation for home oxygen)  Patient Saturations on Room Air at Rest = 79%  Patient Saturations on Room Air while Ambulating = N/A  Patient Saturations on N/A Liters of oxygen while Ambulating = N/A   Please briefly explain why patient needs home oxygen:   2 liters of O2 at rest 94%

## 2014-02-07 NOTE — Consult Note (Signed)
Reason for Consult: Assistance with management of anasarca/volume overload in a patient with nephrotic syndrome/chronic kidney disease stage III Referring Physician: Dia Crawford M.D. ([facility])  HPI:  63 year old African American of [place] origin with a history of hypertension, diabetes for the past 2 years or so (with history of diabetic foot/neuropathy), history of extrapulmonary tuberculosis treated in 2013, history of congestive heart failure (diastolic), DVT and chronic kidney disease stage III at baseline (creatinine around 1.5) with nephrotic range proteinuria as far back as November 2013- thought to be from diabetes. He admitted this time in transfer from [facility] where he had gone to get an outpatient KUB for evaluation of abdominal pain/distention and diarrhea and became unresponsive in the radiology department-found to be in PEA arrest and underwent CPR/medical management after which he was transferred to [facility]. Here, he had a head CT that was negative, abdominal CT showing bowel edema/ascites and V/Q scan that was negative for PE. He also underwent coronary angiography that was negative. Prior to admission, he was taking carvedilol. Following discharge from his hospitalization last month, he was at [facility] and was being treated for C. difficile colitis.  He was admitted to the hospital last month and at that time, seen by nephrology [facility]) for evaluation of nephrotic syndrome. He had negative serologies (ANA, ANCA, complement levels), negative hepatitis testing and inconclusive SPEP. That admission was for a chronic L plantar foot ulcer with sepsis / septic shock- and because he was also in acute renal failure, the recommendation made at that time to hold his diuretics as pedal swelling was felt to be from his DVT. Apparently he had done well with regards to his fluid status until admission to the hospital this time.   Past Medical  History  Diagnosis Date  . 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 Hypertension 10/14/2012  . Type 2 diabetes mellitus   . Diabetic nephropathy with proteinuria   . 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 Diastolic heart failure   . Cellulitis   . Convulsions   . Iron deficiency anemia   . Venous thrombosis and embolism   . Anasarca   . Hypoalbuminemia   . Elevated alkaline phosphatase level   . Microcytic anemia   . GI bleed   . Nephrotic syndrome     Past Surgical History  Procedure Laterality Date  . Knee arthroscopy  2010    3 times, right knee  . I&d extremity Left 01/17/2014    Procedure: IRRIGATION AND DEBRIDEMENT  LEFT FOOT;  Surgeon: Wylene Simmer, MD;  Location: [place];  Service: Orthopedics;  Laterality: Left;    No family history on file.  Social History:  reports that he quit smoking about 21 years ago. His smoking use included Cigarettes. He smoked 0.00 packs per day for 20 years. He has never used smokeless tobacco. He reports that he does not drink alcohol or use illicit drugs.  Allergies:  Allergies  Allergen Reactions  . Aspirin Itching    Medications:  Scheduled: . amLODipine  10 mg Oral Daily  . cloNIDine  0.1 mg Oral TID  . enoxaparin (LOVENOX) injection  75 mg Subcutaneous Q12H  . FLUoxetine  10 mg Oral Daily  . furosemide  40 mg Intravenous TID  . hydrALAZINE  50 mg Oral 3 times per day  . insulin aspart  0-15 Units Subcutaneous TID WC  . isosorbide mononitrate  30 mg Oral Daily  . warfarin  7.5 mg  Oral ONCE-1800  . Warfarin - Pharmacist Dosing Inpatient   Does not apply q1800    Results for orders placed during the hospital encounter of 01/30/14 (from the past 48 hour(s))  COMPREHENSIVE METABOLIC PANEL     Status: Abnormal   Collection Time    02/05/14  1:39 PM      Result Value Ref Range   Sodium 139  137 - 147 mEq/L   Potassium 3.7  3.7 - 5.3 mEq/L   Chloride 99  96 - 112 mEq/L   CO2 30  19 - 32 mEq/L   Glucose, Bld  224 (*) 70 - 99 mg/dL   BUN 32 (*) 6 - 23 mg/dL   Creatinine, Ser 1.49 (*) 0.50 - 1.35 mg/dL   Calcium 8.5  8.4 - 10.5 mg/dL   Total Protein 6.9  6.0 - 8.3 g/dL   Albumin 1.9 (*) 3.5 - 5.2 g/dL   AST 22  0 - 37 U/L   ALT 29  0 - 53 U/L   Alkaline Phosphatase 323 (*) 39 - 117 U/L   Total Bilirubin 0.3  0.3 - 1.2 mg/dL   GFR calc non Af Amer 49 (*) >90 mL/min   GFR calc Af Amer 56 (*) >90 mL/min   Comment: (NOTE)     The eGFR has been calculated using the CKD EPI equation.     This calculation has not been validated in all clinical situations.     eGFR's persistently <90 mL/min signify possible Chronic Kidney     Disease.  MAGNESIUM     Status: None   Collection Time    02/05/14  1:39 PM      Result Value Ref Range   Magnesium 1.8  1.5 - 2.5 mg/dL  CBC WITH DIFFERENTIAL     Status: Abnormal   Collection Time    02/05/14  1:39 PM      Result Value Ref Range   WBC 9.9  4.0 - 10.5 K/uL   RBC 3.46 (*) 4.22 - 5.81 MIL/uL   Hemoglobin 8.4 (*) 13.0 - 17.0 g/dL   HCT 26.8 (*) 39.0 - 52.0 %   MCV 77.5 (*) 78.0 - 100.0 fL   MCH 24.3 (*) 26.0 - 34.0 pg   MCHC 31.3  30.0 - 36.0 g/dL   RDW 20.7 (*) 11.5 - 15.5 %   Platelets 345  150 - 400 K/uL   Neutrophils Relative % 75  43 - 77 %   Neutro Abs 7.4  1.7 - 7.7 K/uL   Lymphocytes Relative 16  12 - 46 %   Lymphs Abs 1.6  0.7 - 4.0 K/uL   Monocytes Relative 5  3 - 12 %   Monocytes Absolute 0.5  0.1 - 1.0 K/uL   Eosinophils Relative 4  0 - 5 %   Eosinophils Absolute 0.4  0.0 - 0.7 K/uL   Basophils Relative 0  0 - 1 %   Basophils Absolute 0.0  0.0 - 0.1 K/uL  GLUCOSE, CAPILLARY     Status: Abnormal   Collection Time    02/05/14  4:37 PM      Result Value Ref Range   Glucose-Capillary 208 (*) 70 - 99 mg/dL   Comment 1 Documented in Chart     Comment 2 Notify RN    GLUCOSE, CAPILLARY     Status: Abnormal   Collection Time    02/05/14  8:10 PM      Result Value Ref Range  Glucose-Capillary 142 (*) 70 - 99 mg/dL  PROTIME-INR      Status: Abnormal   Collection Time    02/06/14  5:00 AM      Result Value Ref Range   Prothrombin Time 16.4 (*) 11.6 - 15.2 seconds   INR 1.36  0.00 - 1.49  COMPREHENSIVE METABOLIC PANEL     Status: Abnormal   Collection Time    02/06/14  5:00 AM      Result Value Ref Range   Sodium 139  137 - 147 mEq/L   Potassium 3.8  3.7 - 5.3 mEq/L   Chloride 99  96 - 112 mEq/L   CO2 31  19 - 32 mEq/L   Glucose, Bld 133 (*) 70 - 99 mg/dL   BUN 36 (*) 6 - 23 mg/dL   Creatinine, Ser 1.60 (*) 0.50 - 1.35 mg/dL   Calcium 8.5  8.4 - 10.5 mg/dL   Total Protein 6.4  6.0 - 8.3 g/dL   Albumin 1.9 (*) 3.5 - 5.2 g/dL   AST 18  0 - 37 U/L   ALT 24  0 - 53 U/L   Alkaline Phosphatase 287 (*) 39 - 117 U/L   Total Bilirubin 0.3  0.3 - 1.2 mg/dL   GFR calc non Af Amer 45 (*) >90 mL/min   GFR calc Af Amer 52 (*) >90 mL/min   Comment: (NOTE)     The eGFR has been calculated using the CKD EPI equation.     This calculation has not been validated in all clinical situations.     eGFR's persistently <90 mL/min signify possible Chronic Kidney     Disease.  CBC WITH DIFFERENTIAL     Status: Abnormal   Collection Time    02/06/14  5:00 AM      Result Value Ref Range   WBC 8.0  4.0 - 10.5 K/uL   RBC 3.14 (*) 4.22 - 5.81 MIL/uL   Hemoglobin 7.7 (*) 13.0 - 17.0 g/dL   HCT 24.3 (*) 39.0 - 52.0 %   MCV 77.4 (*) 78.0 - 100.0 fL   MCH 24.5 (*) 26.0 - 34.0 pg   MCHC 31.7  30.0 - 36.0 g/dL   RDW 20.8 (*) 11.5 - 15.5 %   Platelets 294  150 - 400 K/uL   Neutrophils Relative % 68  43 - 77 %   Neutro Abs 5.4  1.7 - 7.7 K/uL   Lymphocytes Relative 20  12 - 46 %   Lymphs Abs 1.6  0.7 - 4.0 K/uL   Monocytes Relative 8  3 - 12 %   Monocytes Absolute 0.6  0.1 - 1.0 K/uL   Eosinophils Relative 4  0 - 5 %   Eosinophils Absolute 0.4  0.0 - 0.7 K/uL   Basophils Relative 0  0 - 1 %   Basophils Absolute 0.0  0.0 - 0.1 K/uL  MAGNESIUM     Status: None   Collection Time    02/06/14  5:00 AM      Result Value Ref Range    Magnesium 1.7  1.5 - 2.5 mg/dL  GLUCOSE, CAPILLARY     Status: Abnormal   Collection Time    02/06/14  7:33 AM      Result Value Ref Range   Glucose-Capillary 155 (*) 70 - 99 mg/dL  GLUCOSE, CAPILLARY     Status: Abnormal   Collection Time    02/06/14 11:24 AM      Result Value Ref  Range   Glucose-Capillary 196 (*) 70 - 99 mg/dL  GLUCOSE, CAPILLARY     Status: Abnormal   Collection Time    02/06/14  4:22 PM      Result Value Ref Range   Glucose-Capillary 220 (*) 70 - 99 mg/dL  GLUCOSE, CAPILLARY     Status: Abnormal   Collection Time    02/06/14  8:52 PM      Result Value Ref Range   Glucose-Capillary 181 (*) 70 - 99 mg/dL  PROTEIN / CREATININE RATIO, URINE     Status: Abnormal   Collection Time    02/06/14 11:36 PM      Result Value Ref Range   Creatinine, Urine 53.83     Total Protein, Urine 197.4     Comment: NO NORMAL RANGE ESTABLISHED FOR THIS TEST   PROTEIN CREATININE RATIO 3.67 (*) 0.00 - 0.15  PROTIME-INR     Status: Abnormal   Collection Time    02/07/14  4:52 AM      Result Value Ref Range   Prothrombin Time 17.5 (*) 11.6 - 15.2 seconds   INR 1.48  0.00 - 1.49  COMPREHENSIVE METABOLIC PANEL     Status: Abnormal   Collection Time    02/07/14  4:52 AM      Result Value Ref Range   Sodium 137  137 - 147 mEq/L   Potassium 4.2  3.7 - 5.3 mEq/L   Chloride 96  96 - 112 mEq/L   CO2 33 (*) 19 - 32 mEq/L   Glucose, Bld 169 (*) 70 - 99 mg/dL   BUN 37 (*) 6 - 23 mg/dL   Creatinine, Ser 1.43 (*) 0.50 - 1.35 mg/dL   Calcium 8.5  8.4 - 10.5 mg/dL   Total Protein 6.5  6.0 - 8.3 g/dL   Albumin 1.9 (*) 3.5 - 5.2 g/dL   AST 32  0 - 37 U/L   ALT 29  0 - 53 U/L   Alkaline Phosphatase 319 (*) 39 - 117 U/L   Total Bilirubin 0.3  0.3 - 1.2 mg/dL   GFR calc non Af Amer 51 (*) >90 mL/min   GFR calc Af Amer 59 (*) >90 mL/min   Comment: (NOTE)     The eGFR has been calculated using the CKD EPI equation.     This calculation has not been validated in all clinical situations.      eGFR's persistently <90 mL/min signify possible Chronic Kidney     Disease.  CBC WITH DIFFERENTIAL     Status: Abnormal   Collection Time    02/07/14  4:52 AM      Result Value Ref Range   WBC 7.8  4.0 - 10.5 K/uL   RBC 3.36 (*) 4.22 - 5.81 MIL/uL   Hemoglobin 8.1 (*) 13.0 - 17.0 g/dL   HCT 26.0 (*) 39.0 - 52.0 %   MCV 77.4 (*) 78.0 - 100.0 fL   MCH 24.1 (*) 26.0 - 34.0 pg   MCHC 31.2  30.0 - 36.0 g/dL   RDW 20.6 (*) 11.5 - 15.5 %   Platelets 303  150 - 400 K/uL   Neutrophils Relative % 74  43 - 77 %   Neutro Abs 5.7  1.7 - 7.7 K/uL   Lymphocytes Relative 15  12 - 46 %   Lymphs Abs 1.1  0.7 - 4.0 K/uL   Monocytes Relative 7  3 - 12 %   Monocytes Absolute 0.5  0.1 -  1.0 K/uL   Eosinophils Relative 5  0 - 5 %   Eosinophils Absolute 0.4  0.0 - 0.7 K/uL   Basophils Relative 0  0 - 1 %   Basophils Absolute 0.0  0.0 - 0.1 K/uL  MAGNESIUM     Status: None   Collection Time    02/07/14  4:52 AM      Result Value Ref Range   Magnesium 1.7  1.5 - 2.5 mg/dL  GLUCOSE, CAPILLARY     Status: Abnormal   Collection Time    02/07/14  7:43 AM      Result Value Ref Range   Glucose-Capillary 162 (*) 70 - 99 mg/dL  GLUCOSE, CAPILLARY     Status: Abnormal   Collection Time    02/07/14 11:43 AM      Result Value Ref Range   Glucose-Capillary 188 (*) 70 - 99 mg/dL    Dg Chest Port 1 View  02/06/2014   CLINICAL DATA:  Increasing oxygen requirement  EXAM: PORTABLE CHEST - 1 VIEW  COMPARISON:  Prior chest x-ray 02/02/2014  FINDINGS: Persistently low inspiratory volumes with bibasilar atelectasis. Overall, the pulmonary aeration has improved compared to the prior radiograph likely secondary to a decrease in interstitial edema. There are persistent left slightly larger than right small layering pleural effusions. The cardiac and mediastinal contours are unchanged. No pneumothorax. No acute osseous abnormality.  IMPRESSION: 1. Overall, improved pulmonary aeration compared to 02/02/2014 likely from an  interval decrease in interstitial pulmonary edema. 2. Persistently low inspiratory volumes with small bilateral pleural effusions and bibasilar atelectasis. Superimposed infiltrate is difficult to exclude radiographically.   Electronically Signed   By: Jacqulynn Cadet M.D.   On: 02/06/2014 09:17    Review of Systems  Constitutional: Positive for chills and malaise/fatigue. Negative for fever, weight loss and diaphoresis.  HENT: Negative.   Eyes: Negative.   Respiratory: Positive for cough and shortness of breath. Negative for hemoptysis, sputum production and wheezing.   Cardiovascular: Positive for orthopnea, leg swelling and PND. Negative for chest pain, palpitations and claudication.  Gastrointestinal: Positive for nausea and abdominal pain. Negative for heartburn, diarrhea, constipation, blood in stool and melena.       Abdominal distension  Genitourinary: Negative.   Musculoskeletal: Positive for back pain. Negative for falls, joint pain, myalgias and neck pain.  Skin: Negative.   Neurological: Positive for weakness. Negative for dizziness, tingling, tremors, sensory change and speech change.  Endo/Heme/Allergies: Negative for environmental allergies. Does not bruise/bleed easily.  Psychiatric/Behavioral: The patient is nervous/anxious.    Blood pressure 142/77, pulse 83, temperature 98 F (36.7 C), temperature source Oral, resp. rate 18, height '5\' 6"'  (1.676 m), weight 73.936 kg (163 lb), SpO2 97.00%. Physical Exam  Nursing note and vitals reviewed. Constitutional: He is oriented to person, place, and time. He appears well-developed and well-nourished.  Sitting on side of bed- appears uncomfortable  HENT:  Head: Normocephalic and atraumatic.  Nose: Nose normal.  Mouth/Throat: No oropharyngeal exudate.  Eyes: Conjunctivae and EOM are normal. Pupils are equal, round, and reactive to light. No scleral icterus.  Neck: Normal range of motion. Neck supple. JVD present. No tracheal  deviation present. No thyromegaly present.  9cm JVP  Cardiovascular: Normal rate and regular rhythm.  Exam reveals no friction rub.   No murmur heard. Respiratory: Effort normal. No respiratory distress. He has no wheezes. He has rales. He exhibits no tenderness.  Decreased BS over bases  GI: Bowel sounds are normal. He exhibits  distension. He exhibits no mass. There is tenderness. There is no rebound and no guarding.  Firm and distended with fluid wave  Genitourinary:  Scrotal edema  Musculoskeletal: Normal range of motion. He exhibits edema and tenderness.  2-3+ anasarca  Neurological: He is alert and oriented to person, place, and time.  Skin: Skin is warm and dry. No rash noted. No erythema.  Psychiatric: His behavior is normal. Judgment and thought content normal.    Assessment/Plan: 1. Anasarca with nephrotic syndrome: Underlying nephrotic syndrome appears to be from diabetic kidney disease and has been negative serologically. Renal biopsy does not appear to be warranted at this point (constellation of findings points towards the late diagnosis of diabetes and they have been present for greater than 10 years given the concomitant presence of neuropathy and likely retinopathy). We'll begin diuretic therapy as he is clearly volume overloaded and with renal function back to baseline. It is possible that his current creatinine may be artificially low in the face of hypervolemia/dilution. Given that he is Lasix naive (not taking prior to admission), to be able to get away with Lasix monotherapy rather than the need for combining this with metolazone. Continue on a sodium restricted diet and monitor input/output closely. Plan to start ACE inhibitor/ARB (hopefully as renal function stabilizes and volume status improves). 2. Exacerbation of congestive heart failure: Patient with history of diastolic heart failure and recent PEA arrest with negative coronary angiography. Optimize diuresis, continue  to follow off negative chronotropic agents. 3. Chronic kidney disease stage III: Renal function appears to be stable-slightly better creatinine may be dilutional/artificial. Should anticipate baseline creatinine about 1.8-1.9 after successful restoration of euvolemic status. 4. Hypertension: Likely exacerbated by volume overload, anticipate to improve following diuresis  5. Anemia: Possibly anemia of chronic kidney disease, check iron stores and consider ESA therapy plus/minus   intravenous iron  Daichi Moris K. 02/07/2014, 1:34 PM

## 2014-02-07 NOTE — Progress Notes (Signed)
Physical Therapy Treatment Patient Details Name: Joshua [name] MRN: [id number] DOB: Dec 20, 1950 Today's Date: 02/07/2014 Time: [id number] PT Time Calculation (min): 19 min  PT Assessment / Plan / Recommendation  History of Present Illness 63 y.o. male admitted to [facility] on 01/06/14 with DM2, previous treatment (abbreviated) for extrapulmonary TB admitted with worseing foot ulcer with increasing pain, chills.  He had a foot ulcer plantar surface first metatarsal head left foot.  His hospital course was complicated by bradycardia, hypotension, seizure in the ICU.  The pt was intubated 01/07/14-01/08/14.  He was found to have bil peroneal DVTs, bil pleural effusions on chest CT (neg for PE), abdominal US revealed acute acalculous cholecystitis, and MRI of the foot showed no osteo, diffuse cellulitis, myositis, small 1st MTP joint effusion that is likely aseptic and incidental, osteonecrosis of first MTP joint.  Ortho preformed a bedside aspiration of the wound on 01/11/14.  Ortho recommends WBAT in post-op shoe.  S/p I&D L foot 12/17/13.     PT Comments   Better participation today, with slowly increasing amb distance Pt called a family member to bring in a shoe for his R foot to use while ambulating  Follow Up Recommendations  SNF;Supervision/Assistance - 24 hour     Does the patient have the potential to tolerate intense rehabilitation     Barriers to Discharge        Equipment Recommendations  Rolling walker with 5" wheels    Recommendations for Other Services OT consult  Frequency Min 3X/week   Progress towards PT Goals Progress towards PT goals: Progressing toward goals  Plan Current plan remains appropriate    Precautions / Restrictions Precautions Precautions: Fall Precaution Comments: weak, self limiting and desats with activity Required Braces or Orthoses: Other Brace/Splint Other Brace/Splint: post op shoe, left foot Restrictions LLE Weight Bearing: Weight bearing as tolerated    Pertinent Vitals/Pain Session conducted on 2 Liters O2 Elevated Bil LEs  for edema and pain control     Mobility  Bed Mobility Overal bed mobility: Needs Assistance Bed Mobility: Supine to Sit Supine to sit: Min assist General bed mobility comments: cues for sequence, safety and assist to elevate trunk from surface Transfers Overall transfer level: Needs assistance Equipment used: Rolling walker (2 wheeled) Transfers: Sit to/from Stand Sit to Stand: Min assist;+2 safety/equipment General transfer comment: Step by step cues and max encouragement Ambulation/Gait Ambulation/Gait assistance: +2 safety/equipment;Min assist Ambulation Distance (Feet): 18 Feet Assistive device: Rolling walker (2 wheeled) Gait Pattern/deviations: Decreased step length - right;Decreased step length - left;Shuffle Gait velocity: decreased General Gait Details: More motivated today, responding with . "I'll try" when asked if we can walk in the hallway; tending to have RW too far in front, requires physical assist to keep RW safely close; cues for stepping, RW placement, and posture    Exercises     PT Diagnosis:    PT Problem List:   PT Treatment Interventions:     PT Goals (current goals can now be found in the care plan section) Acute Rehab PT Goals PT Goal Formulation: With patient Time For Goal Achievement: 02/17/14 Potential to Achieve Goals: Good  Visit Information  Last PT Received On: 02/07/14 Assistance Needed: +2 History of Present Illness: 63 y.o. male admitted [facility] on 01/06/14 with DM2, previous treatment (abbreviated) for extrapulmonary TB admitted with worseing foot ulcer with increasing pain, chills.  He had a foot ulcer plantar surface first metatarsal head left foot.  His hospital course was complicated by  bradycardia, hypotension, seizure in the ICU.  The pt was intubated 01/07/14-01/08/14.  He was found to have bil peroneal DVTs, bil pleural effusions on chest CT (neg for PE),  abdominal US revealed acute acalculous cholecystitis, and MRI of the foot showed no osteo, diffuse cellulitis, myositis, small 1st MTP joint effusion that is likely aseptic and incidental, osteonecrosis of first MTP joint.  Ortho preformed a bedside aspiration of the wound on 01/11/14.  Ortho recommends WBAT in post-op shoe.  S/p I&D L foot 12/17/13.      Subjective Data  Subjective: Would really like to walk in R shoe along with L [place] shoe, but agreeable to walk, even without a shoe for the right foot   Cognition  Cognition Arousal/Alertness: Awake/alert Behavior During Therapy: WFL for tasks assessed/performed Overall Cognitive Status: Within Functional Limits for tasks assessed    Balance  Balance Overall balance assessment: Needs assistance Standing balance support: Bilateral upper extremity supported Standing balance-Leahy Scale: Poor Standing balance comment: Relies on bil UE suport  End of Session PT - End of Session Equipment Utilized During Treatment: Gait belt;Oxygen Activity Tolerance: Patient limited by fatigue Patient left: in chair;with call bell/phone within reach Nurse Communication: Mobility status   GP     [name] [facility] 02/07/2014, 7:32 PM [name], [place] Pager [phone] Office [phone]

## 2014-02-07 NOTE — Evaluation (Signed)
Clinical/Bedside Swallow Evaluation Patient Details  Name: Joshua [name] MRN: [id number] Date of Birth: Aug 31, 1951  Today's Date: 02/07/2014 Time: [id number] SLP Time Calculation (min): 20 min  Past Medical History:  Past Medical History  Diagnosis Date  . 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 Hypertension 10/14/2012  . Type 2 diabetes mellitus   . Diabetic nephropathy with proteinuria   . 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 Diastolic heart failure   . Cellulitis   . Convulsions   . Iron deficiency anemia   . Venous thrombosis and embolism   . Anasarca   . Hypoalbuminemia   . Elevated alkaline phosphatase level   . Microcytic anemia   . GI bleed   . Nephrotic syndrome    Past Surgical History:  Past Surgical History  Procedure Laterality Date  . Knee arthroscopy  2010    3 times, right knee  . I&d extremity Left 01/17/2014    Procedure: IRRIGATION AND DEBRIDEMENT  LEFT FOOT;  Surgeon: Joshua Simmer, MD;  Location: [place];  Service: Orthopedics;  Laterality: Left;   HPI:  [name] PMHx DM type 2 with proliferative retinopathy/neuropathy, CKD STAGE III, HTN, diastolic CHF, Hx tuberculosis, Hx seizures, nephrotic syndrome, Hx DVT popliteal vein. Patient was previously admitted to [facility] on 01/06/2014 with worsening foot ulcer, increased pain and chills. Course complicated by bradycardia/hypotension, and seizure while in the ICU. Patient was intubated 2/13-2/14. Found to have bilateral peroneal DVTs, bilateral pleural effusions (negative for PE), acute acalculous cholecystitis. Patient re-admitted on 01/30/2014 s/p undergoing outpatient kub due to diarrhea, abdominal distention (from rehab center) when he had asystole/PEA arrest with ROSC in 14 minutes. Intubated 3/8-3/9.  Per RN,  he has complained of some difficulty swallowing since readmission. Most recent CXR 3/15: Persistently low inspiratory volumes with small bilateral pleural effusions and  bibasilar atelectasis. Superimposed infiltrate is difficult to exclude radiographically.    Assessment / Plan / Recommendation Clinical Impression  Patient presents with a suspected primary esophageal dysphagia characterized by c/o globus across consistencies which reportendly began prior to admission. Oropharyngeal swallow appears intact without overt evidence of aspiration. Education complete with patient regarding general esophageal precautions which may mitigate symptoms. Defer further management of GERD-like symptoms to MD.     Aspiration Risk  Mild    Diet Recommendation Regular;Thin liquid   Liquid Administration via: Cup;Straw Medication Administration: Whole meds with liquid Supervision: Patient able to self feed Compensations: Slow rate;Small sips/bites;Follow solids with liquid Postural Changes and/or Swallow Maneuvers: Seated upright 90 degrees;Upright 30-60 min after meal    Other  Recommendations Oral Care Recommendations: Oral care BID   Follow Up Recommendations  None       Pertinent Vitals/Pain n/a        Swallow Study    General HPI: [name] PMHx DM type 2 with proliferative retinopathy/neuropathy, CKD STAGE III, HTN, diastolic CHF, Hx tuberculosis, Hx seizures, nephrotic syndrome, Hx DVT popliteal vein. Patient was previously admitted to [facility] on 01/06/2014 with worsening foot ulcer, increased pain and chills. Course complicated by bradycardia/hypotension, and seizure while in the ICU. Patient was intubated 2/13-2/14. Found to have bilateral peroneal DVTs, bilateral pleural effusions (negative for PE), acute acalculous cholecystitis. Patient re-admitted on 01/30/2014 s/p undergoing outpatient kub due to diarrhea, abdominal distention (from rehab center) when he had asystole/PEA arrest with ROSC in 14 minutes. Intubated 3/8-3/9.  Per RN,  he has complained of some difficulty swallowing since readmission. Most recent CXR  3/15: Persistently low inspiratory  volumes with small bilateral pleural effusions and bibasilar atelectasis. Superimposed infiltrate is difficult to exclude radiographically.  Type of Study: Bedside swallow evaluation Previous Swallow Assessment: none Diet Prior to this Study: Regular;Thin liquids Temperature Spikes Noted: No Respiratory Status: Nasal cannula History of Recent Intubation: Yes Length of Intubations (days): 1 days Date extubated: 01/31/14 Behavior/Cognition: Alert;Cooperative;Pleasant mood Oral Cavity - Dentition: Adequate natural dentition Self-Feeding Abilities: Able to feed self Patient Positioning: Upright in bed Baseline Vocal Quality: Clear Volitional Cough: Strong Volitional Swallow: Able to elicit    Oral/Motor/Sensory Function Overall Oral Motor/Sensory Function: Appears within functional limits for tasks assessed   Ice Chips Ice chips: Not tested   Thin Liquid Thin Liquid: Impaired Presentation: Cup;Straw;Self Fed Pharyngeal  Phase Impairments: Other (comments) (audible swallow, c/o globus)    Nectar Thick Nectar Thick Liquid: Not tested   Honey Thick Honey Thick Liquid: Not tested   Puree Puree: Impaired Presentation: Self Fed;Spoon Pharyngeal Phase Impairments: Other (comments) (audible swallow, c/o globus)   Solid   GO    Solid: Impaired Presentation: Self Fed Pharyngeal Phase Impairments: Other (comments) (c/o globus)      Joshua Eick MA, CCC-SLP ([phone]  Joshua [name] Husband Joshua [name] 02/07/2014,9:40 AM

## 2014-02-07 NOTE — Progress Notes (Signed)
ANTICOAGULATION CONSULT NOTE - Follow Up Consult  Pharmacy Consult for Coumadin Indication: Recent bilateral DVT  Allergies  Allergen Reactions  . Aspirin Itching    Patient Measurements: Height: 5\' 6"  (167.6 cm) Weight: 163 lb (73.936 kg) IBW/kg (Calculated) : 63.8  Vital Signs: Temp: 98 F (36.7 C) (03/16 0507) Temp src: Oral (03/16 0507) BP: 142/77 mmHg (03/16 0507) Pulse Rate: 83 (03/16 0507)  Labs:  Recent Labs  02/05/14 0547 02/05/14 1339 02/06/14 0500 02/07/14 0452  HGB 7.5* 8.4* 7.7* 8.1*  HCT 23.8* 26.8* 24.3* 26.0*  PLT 310 345 294 303  LABPROT 17.0*  --  16.4* 17.5*  INR 1.42  --  1.36 1.48  CREATININE  --  1.49* 1.60* 1.43*    Estimated Creatinine Clearance: 48.3 ml/min (by C-G formula based on Cr of 1.43).  Assessment: 63 yo M on Lovenox bridging to Coumadin for recent DVT (2/15). INR trending up to 1.48.  PTA regimen of alternating Coumadin doses of 7.5mg  and 5mg  daily. Patient did receive two doses of Vitamin K which may be contributing to the resistant INR. Will continue Lovenox until INR is therapeutic. - H/H low but stable, Plts wnl - No significant bleeding reported - Noted the addition of Fluoxetine which may increase INR when used with Coumadin  Goal of Therapy:  INR 2-3 Monitor platelets by anticoagulation protocol: Yes   Plan:  1. Coumadin 7.5mg  PO x 1 today 2. Continue Lovenox 75mg  SQ q12h  3. Follow-up AM INR and CBC   Briceyda Abdullah, Pharm.D., BCPS Clinical Pharmacist Pager [phone] 02/07/2014 9:57 AM

## 2014-02-07 NOTE — Progress Notes (Signed)
[facility] PROGRESS NOTE  Joshua [name] [id number] DOB: September 29, 1951 DOA: 01/30/2014 PCP: Duwaine Maxin, DO  Assessment/Plan:  Asystole -Continue to avoid all AV nodal blocking agent  Acute respiratory failure -3/16 ambulatory SpO2 findings;  Patient Saturations on Room Air at Rest = 79%  Patient Saturations on [employer] while Ambulating = N/A  Patient Saturations on N/A Liters of oxygen while Ambulating = N/A  Please briefly explain why patient needs home oxygen:  2 liters of O2 at rest 94% -Patient's continued O2 requirement most likely secondary to diastolic CHF decompensated -See chronic diastolic CHF   Tachybradycardia syndrome -See asystole  Chronic diastolic CHF -Patient unsure of dry weight, however currently anasarca -Continue Norvasc 10 mg daily -Continue clonidine tablet 0.1 mg TID -Continue hydralazine 50 mg TID -Continue Imdur 30 mg daily -Increase Lasix 40 mg TID  -3/15 Patient creatinine trending up will decrease Lasix to 40 mg daily; DC Zaroxolyn 5 mg BID -3/9 highest admission weight =78.4 kg,  3/14 weight= 77.4 kg (bed), 3/15 weight= 73.8 kg (bed), 3/16 weight= 73.9 kg ; NOTE reported NET fluid balance on 3/16= -10.6 L -3/15 orthostatic vitals; patient is not orthostatic  -  HTN -See diastolic CHF  Seizure -Currently not on seizure medication, patient unsure of any previous medication and cause.  Nephrotic syndrome -3/15 spot protein/creatinine ratio= 3.67  A. 3/16 Dr. Patel(nephrology) will see. Last seen by Dr.COLADONATO,JOSEPH A (nephrology)              on 01/14/2014;                  Diabetes type 2 with retinopathy/neuropathy -Hemoglobin A1c on 11/26/2013= 6.5; within ADA guidelines -Continue moderate SSI  HLD -Although patient's LDL greater than ADA goal of70mg /dL, his extremely high HDL of 83 mg/dL cancels out his mildly elevated LDL  Suicidal ideation -Patient expressed to PT Dickinson I just want to die" "just kill me -Per  psychiatry No evidence of imminent risk to self or others at present.  D/C sitter  -Patient does not meet criteria for psychiatric inpatient admission.  -Per psychiatry start Fluoxetine 10 mg PO QD for depression  DVT popliteal vein -Continue full dose Lovenox until patient therapeutic on Coumadin  -Continue Coumadin per pharmacy; 3/16 INR= 1.48  Dysphasia? -Have speech conduct bedside swallow      Code Status: Full Family Communication: None available Disposition Plan: Resolution of decompensated diastolic CHF     Consultants: Dr.JONNALAGADDA,JANARDHAHA R. (psychiatry) Dr. Patel(nephrology)     Procedures: PCXR 02/06/2014 1. Overall, improved pulmonary aeration compared to 02/02/2014 likely  from an interval decrease in interstitial pulmonary edema.  2. Persistently low inspiratory volumes with small bilateral pleural  effusions and bibasilar atelectasis. Superimposed infiltrate is  difficult to exclude radiographically.  PCXR 02/02/2014 Hypoaeration. Central vascular congestion and interstitial and hazy  airspace opacities may reflect edema (favored) versus multifocal  infection.  Small effusions and bibasilar opacities, favor atelectasis.   Echocardiogram 01/31/2014 - Left ventricle: The cavity size was normal. Wall thickness was normal. Systolic function was normal. -LVEF= 55% to 60%.Systolic function was mildly reduced. - Aortic valve: Trivial regurgitation. - Left atrium: The atrium was mildly dilated. - Right ventricle: The cavity size was mildly dilated. - Right atrium: The atrium was moderately dilated. - Atrial septum: No defect or patent foramen ovale was identified.  On 01/14/2014  Normal renal size with increased parenchymal echogenicity,  consistent with medical renal disease. No evidence of  hydronephrosis.  Left pleural  effusion incidentally noted.    Antibiotics:    HPI/Subjective: [name] PMHx  DM type 2 with proliferative  retinopathy/neuropathy, CKD STAGE III, HTN, diastolic CHF, Hx tuberculosis, Hx seizures, nephrotic syndrome, Hx DVT popliteal vein. 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 Patient was previously admitted to [facility] on [id number] which was complicated by bradycardia/hypotension, and seizure while in the ICU. Patient was intubated 2/13-2/14. Follow have bilateral peroneal DVTs, bilateral pleural effusions (negative for PE), acute acalculous cholecystitis. Patient admitted on 01/30/2014 Patient undergoing outpatient kub due to diarrhea, abdominal distention (from rehab center) when he had asystole/PEA arrest with ROSC in 14 minutes. 3/8 weight= 75.3 kg 3/13 patient states still having some SOB , negative CP, positive abdominal and lower extremity swelling. Patient unsure of his dry weight. Patient on 3 L O2 via [place] SpO2= 99%. Patient states does not use home O2 3/14 states still DOE, negative suicidal or homicidal ideation, negative N./V. 3/15 states his work of breathing is improved, but has not been ambulating yet. Negative CP, negative SOB, negative. N./V. Per patient RN he has complained of some difficulty with eating and swallowing food. 3/16 patient's     Objective: Filed Vitals:   02/06/14 1735 02/06/14 1738 02/06/14 2056 02/07/14 0507  BP: 147/77 147/76 135/74 142/77  Pulse: 94 93 82 83  Temp: 97.8 F (36.6 C) 98 F (36.7 C) 97.9 F (36.6 C) 98 F (36.7 C)  TempSrc: Oral Oral Oral Oral  Resp: 20 20 18 18   Height:      Weight:    73.936 kg (163 lb)  SpO2: 100% 95% 100% 97%    Intake/Output Summary (Last 24 hours) at 02/07/14 1205 Last data filed at 02/07/14 [id number]  Gross per 24 hour  Intake      0 ml  Output   2750 ml  Net  -2750 ml   Filed Weights   02/05/14 0500 02/06/14 0500 02/07/14 0507  Weight: 77.4 kg (170 lb 10.2 oz) 73.8 kg (162 lb 11.2 oz) 73.936 kg (163 lb)    Exam:   General:  A./O. x4, NAD  Cardiovascular: Rhythm and rate,  negative murmurs rubs or gallops  Respiratory: Clear to auscultation bilateral   Abdomen: Distended abdomen (improved), nontender, tense, plus bowel sounds (anasarca improved from 3/14)  Psychiatric patient denies suicidal or homicidal ideation. States had suicidal ideations from 2/13-3/13   Musculoskeletal: Pedal edema to 2-3+ to hips bilateral , although tightness of legs and abdomen have decreased   Data Reviewed: Basic Metabolic Panel:  Recent Labs Lab 02/02/14 0440 02/02/14 1228 02/04/14 0457 02/05/14 1339 02/06/14 0500 02/07/14 0452  NA 140  --  142 139 139 137  K 4.2  --  4.0 3.7 3.8 4.2  CL 103  --  103 99 99 96  CO2 26  --  26 30 31  33*  GLUCOSE 160*  --  112* 224* 133* 169*  BUN 44*  --  36* 32* 36* 37*  CREATININE 1.68* 1.54* 1.51* 1.49* 1.60* 1.43*  CALCIUM 8.5  --  8.3* 8.5 8.5 8.5  MG  --   --   --  1.8 1.7 1.7   Liver Function Tests:  Recent Labs Lab 02/05/14 1339 02/06/14 0500 02/07/14 0452  AST 22 18 32  ALT 29 24 29   ALKPHOS 323* 287* 319*  BILITOT 0.3 0.3 0.3  PROT 6.9 6.4 6.5  ALBUMIN 1.9* 1.9* 1.9*   No results found for this  basename: LIPASE, AMYLASE,  in the last 168 hours No results found for this basename: AMMONIA,  in the last 168 hours CBC:  Recent Labs Lab 02/04/14 0457 02/05/14 0547 02/05/14 1339 02/06/14 0500 02/07/14 0452  WBC 9.3 7.9 9.9 8.0 7.8  NEUTROABS  --   --  7.4 5.4 5.7  HGB 7.8* 7.5* 8.4* 7.7* 8.1*  HCT 25.2* 23.8* 26.8* 24.3* 26.0*  MCV 78.0 77.0* 77.5* 77.4* 77.4*  PLT 331 310 345 294 303   Cardiac Enzymes: No results found for this basename: CKTOTAL, CKMB, CKMBINDEX, TROPONINI,  in the last 168 hours BNP (last 3 results)  Recent Labs  01/30/14 2028  PROBNP 9165.0*   CBG:  Recent Labs Lab 02/06/14 0733 02/06/14 1124 02/06/14 1622 02/06/14 2052 02/07/14 0743  GLUCAP 155* 196* 220* 181* 162*    Recent Results (from the past 240 hour(s))  MRSA PCR SCREENING     Status: None   Collection Time     01/30/14  6:31 PM      Result Value Ref Range Status   MRSA by PCR NEGATIVE  NEGATIVE Final   Comment:            The GeneXpert MRSA Assay (FDA     approved for NASAL specimens     only), is one component of a     comprehensive MRSA colonization     surveillance program. It is not     intended to diagnose MRSA     infection nor to guide or     monitor treatment for     MRSA infections.  CULTURE, BLOOD (ROUTINE X 2)     Status: None   Collection Time    01/30/14  8:28 PM      Result Value Ref Range Status   Specimen Description BLOOD A-LINE   Final   Special Requests BOTTLES DRAWN AEROBIC AND ANAEROBIC 10CC EACH   Final   Culture  Setup Time     Final   Value: 01/31/2014 08:44     Performed at [facility]   Culture     Final   Value: NO GROWTH 5 DAYS     Performed at [facility]   Report Status 02/06/2014 FINAL   Final  CULTURE, BLOOD (ROUTINE X 2)     Status: None   Collection Time    01/30/14  8:46 PM      Result Value Ref Range Status   Specimen Description BLOOD CENTRAL LINE   Final   Special Requests BOTTLES DRAWN AEROBIC AND ANAEROBIC 10CC EACH   Final   Culture  Setup Time     Final   Value: 01/31/2014 08:46     Performed at [facility]   Culture     Final   Value: NO GROWTH 5 DAYS     Performed at [facility]   Report Status 02/06/2014 FINAL   Final     Studies: Dg Chest Port 1 View  02/06/2014   CLINICAL DATA:  Increasing oxygen requirement  EXAM: PORTABLE CHEST - 1 VIEW  COMPARISON:  Prior chest x-ray 02/02/2014  FINDINGS: Persistently low inspiratory volumes with bibasilar atelectasis. Overall, the pulmonary aeration has improved compared to the prior radiograph likely secondary to a decrease in interstitial edema. There are persistent left slightly larger than right small layering pleural effusions. The cardiac and mediastinal contours are unchanged. No pneumothorax. No acute osseous abnormality.  IMPRESSION: 1. Overall,  improved pulmonary aeration compared to  02/02/2014 likely from an interval decrease in interstitial pulmonary edema. 2. Persistently low inspiratory volumes with small bilateral pleural effusions and bibasilar atelectasis. Superimposed infiltrate is difficult to exclude radiographically.   Electronically Signed   By: Jacqulynn Cadet M.D.   On: 02/06/2014 09:17    Scheduled Meds: . amLODipine  10 mg Oral Daily  . cloNIDine  0.1 mg Oral TID  . enoxaparin (LOVENOX) injection  75 mg Subcutaneous Q12H  . FLUoxetine  10 mg Oral Daily  . furosemide  40 mg Intravenous Daily  . hydrALAZINE  50 mg Oral 3 times per day  . insulin aspart  0-15 Units Subcutaneous TID WC  . isosorbide mononitrate  30 mg Oral Daily  . warfarin  7.5 mg Oral ONCE-1800  . Warfarin - Pharmacist Dosing Inpatient   Does not apply q1800   Continuous Infusions: . sodium chloride Stopped (02/03/14 2256)    Active Problems:   Pulmonary tuberculosis   Hypertension   Diabetic neuropathy   New onset seizure   Diabetic proliferative retinopathy   DVT of popliteal vein   Acute on chronic diastolic heart failure   Nephrotic syndrome   Cardiac arrest   Suicide ideation    Time spent:40 minute    Joshua [name], J  [facility] Pager [phone]. If 7PM-7AM, please contact night-coverage at [email or web address], password [facility] 02/07/2014, 12:05 PM  LOS: 8 days

## 2014-02-08 LAB — COMPREHENSIVE METABOLIC PANEL
ALBUMIN: 1.9 g/dL — AB (ref 3.5–5.2)
ALK PHOS: 292 U/L — AB (ref 39–117)
ALT: 25 U/L (ref 0–53)
AST: 22 U/L (ref 0–37)
BILIRUBIN TOTAL: 0.3 mg/dL (ref 0.3–1.2)
BUN: 35 mg/dL — AB (ref 6–23)
CHLORIDE: 95 meq/L — AB (ref 96–112)
CO2: 36 meq/L — AB (ref 19–32)
Calcium: 8.7 mg/dL (ref 8.4–10.5)
Creatinine, Ser: 1.33 mg/dL (ref 0.50–1.35)
GFR calc Af Amer: 65 mL/min — ABNORMAL LOW (ref >90)
GFR calc non Af Amer: 56 mL/min — ABNORMAL LOW (ref >90)
Glucose, Bld: 132 mg/dL — ABNORMAL HIGH (ref 70–99)
Potassium: 3.7 mEq/L (ref 3.7–5.3)
Sodium: 140 mEq/L (ref 137–147)
Total Protein: 6.5 g/dL (ref 6.0–8.3)

## 2014-02-08 LAB — GLUCOSE, CAPILLARY
GLUCOSE-CAPILLARY: 166 mg/dL — AB (ref 70–99)
Glucose-Capillary: 127 mg/dL — ABNORMAL HIGH (ref 70–99)
Glucose-Capillary: 185 mg/dL — ABNORMAL HIGH (ref 70–99)
Glucose-Capillary: 184 mg/dL — ABNORMAL HIGH (ref 70–99)

## 2014-02-08 LAB — CBC WITH DIFFERENTIAL/PLATELET
Basophils Absolute: 0 10*3/uL (ref 0.0–0.1)
Basophils Relative: 0 % (ref 0–1)
EOS PCT: 4 % (ref 0–5)
Eosinophils Absolute: 0.3 10*3/uL (ref 0.0–0.7)
HCT: 25.3 % — ABNORMAL LOW (ref 39.0–52.0)
Hemoglobin: 7.9 g/dL — ABNORMAL LOW (ref 13.0–17.0)
Lymphocytes Relative: 20 % (ref 12–46)
Lymphs Abs: 1.4 10*3/uL (ref 0.7–4.0)
MCH: 23.9 pg — ABNORMAL LOW (ref 26.0–34.0)
MCHC: 31.2 g/dL (ref 30.0–36.0)
MCV: 76.7 fL — AB (ref 78.0–100.0)
Monocytes Absolute: 0.5 10*3/uL (ref 0.1–1.0)
Monocytes Relative: 7 % (ref 3–12)
Neutro Abs: 5.1 10*3/uL (ref 1.7–7.7)
Neutrophils Relative %: 70 % (ref 43–77)
Platelets: 297 10*3/uL (ref 150–400)
RBC: 3.3 MIL/uL — ABNORMAL LOW (ref 4.22–5.81)
RDW: 20.2 % — AB (ref 11.5–15.5)
WBC: 7.3 10*3/uL (ref 4.0–10.5)

## 2014-02-08 LAB — PROTIME-INR
INR: 1.59 — ABNORMAL HIGH (ref 0.00–1.49)
PROTHROMBIN TIME: 18.5 s — AB (ref 11.6–15.2)

## 2014-02-08 LAB — MAGNESIUM: Magnesium: 1.6 mg/dL (ref 1.5–2.5)

## 2014-02-08 MED ORDER — SODIUM CHLORIDE 0.9 % IV SOLN
1020.0000 mg | Freq: Once | INTRAVENOUS | Status: AC
  Administered 2014-02-08: 1020 mg via INTRAVENOUS
  Filled 2014-02-08: qty 34

## 2014-02-08 MED ORDER — INSULIN ASPART 100 UNIT/ML ~~LOC~~ SOLN
0.0000 [IU] | SUBCUTANEOUS | Status: DC
  Administered 2014-02-08 – 2014-02-09 (×2): 4 [IU] via SUBCUTANEOUS

## 2014-02-08 MED ORDER — WARFARIN SODIUM 7.5 MG PO TABS
7.5000 mg | ORAL_TABLET | Freq: Once | ORAL | Status: AC
  Administered 2014-02-08: 7.5 mg via ORAL
  Filled 2014-02-08: qty 1

## 2014-02-08 NOTE — Progress Notes (Signed)
[facility] PROGRESS NOTE  Joshua [name] [id number] DOB: [birth year] DOA: 01/30/2014 PCP: Duwaine Maxin, DO  Assessment/Plan:  Asystole -Continue to avoid all AV nodal blocking agent  Acute respiratory failure -3/16 ambulatory SpO2 findings;  Patient Saturations on Room Air at Rest = 79%  Patient Saturations on [employer] while Ambulating = N/A  Patient Saturations on N/A Liters of oxygen while Ambulating = N/A  Please briefly explain why patient needs home oxygen:  2 liters of O2 at rest 94% -Patient's continued O2 requirement most likely secondary to diastolic CHF decompensated -See chronic diastolic CHF   Tachybradycardia syndrome -See asystole  Acute/Chronic diastolic CHF -Patient unsure of dry weight, however currently anasarca -Continue Norvasc 10 mg daily -Continue clonidine tablet 0.1 mg TID -Continue hydralazine 50 mg TID -Continue Imdur 30 mg daily -3/15 Patient creatinine trending up will decrease Lasix to 40 mg daily; DC Zaroxolyn 5 mg BID -3/9 highest admission weight =78.4 kg,  3/14 weight= 77.4 kg (bed), 3/15 weight= 73.8 kg (bed), 3/16 weight= 73.9 kg, 3/17 weight (standing) = 70.7 kg ; - NOTE reported NET fluid balance on 3/17=  -16.27 -3/15 orthostatic vitals; patient is not orthostatic  -3/17 continued Lasix IV 40 mg TID  HTN -See diastolic CHF  Seizure -Currently not on seizure medication, patient unsure of any previous medication and cause. -Patient states he was on seizure medication while hospitalized at one point but has never been on outpatient seizure medication  Nephrotic syndrome -3/15 spot protein/creatinine ratio= 3.67  A. 3/16 Dr. Patel(nephrology) will see. Last seen by Dr.COLADONATO,JOSEPH A (nephrology)              on 01/14/2014; -3/17 per nephrology note continue current treatment except for nephrology will administer IV Fe                  Diabetes type 2 with retinopathy/neuropathy -Hemoglobin A1c on 11/26/2013= 6.5; within  ADA guidelines -3/17 patient's CBGs are trending up increased to resistant SSI  HLD -Although patient's LDL greater than ADA goal of70mg /dL, his extremely high HDL of 83 mg/dL cancels out his mildly elevated LDL  Suicidal ideation -Patient expressed to PT [place] I just want to die" "just kill me -Per psychiatry No evidence of imminent risk to self or others at present.  D/C sitter  -Patient does not meet criteria for psychiatric inpatient admission.  -Per psychiatry start Fluoxetine 10 mg PO QD for depression  DVT popliteal vein -Continue full dose Lovenox until patient therapeutic on Coumadin  -Continue Coumadin per pharmacy; 3/17 INR= 1.59  Dysphasia? -3/16 speech conduct bedside swallow; recommends Regular;Thin liquid  Liquid Administration via: Cup;Straw  Medication Administration: Whole meds with liquid -3/17 continue carb modified diet       Code Status: Full Family Communication: None available Disposition Plan: Resolution of decompensated diastolic CHF     Consultants: Dr.JONNALAGADDA,JANARDHAHA R. (psychiatry) Dr. Patel(nephrology)     Procedures: PCXR 02/06/2014 1. Overall, improved pulmonary aeration compared to 02/02/2014 likely  from an interval decrease in interstitial pulmonary edema.  2. Persistently low inspiratory volumes with small bilateral pleural  effusions and bibasilar atelectasis. Superimposed infiltrate is  difficult to exclude radiographically.  PCXR 02/02/2014 Hypoaeration. Central vascular congestion and interstitial and hazy  airspace opacities may reflect edema (favored) versus multifocal  infection.  Small effusions and bibasilar opacities, favor atelectasis.   Echocardiogram 01/31/2014 - Left ventricle: The cavity size was normal. Wall thickness was normal. Systolic function was normal. -LVEF= 55% to 60%.Systolic function  was mildly reduced. - Aortic valve: Trivial regurgitation. - Left atrium: The atrium was mildly  dilated. - Right ventricle: The cavity size was mildly dilated. - Right atrium: The atrium was moderately dilated. - Atrial septum: No defect or patent foramen ovale was identified.  On 01/14/2014  Normal renal size with increased parenchymal echogenicity,  consistent with medical renal disease. No evidence of  hydronephrosis.  Left pleural effusion incidentally noted.    Antibiotics:    HPI/Subjective: [name] PMHx  DM type 2 with proliferative retinopathy/neuropathy, CKD STAGE III, HTN, diastolic CHF, Hx tuberculosis, Hx seizures, nephrotic syndrome, Hx DVT popliteal vein. 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 Patient was previously admitted to [facility] on [id number] which was complicated by bradycardia/hypotension, and seizure while in the ICU. Patient was intubated 2/13-2/14. Follow have bilateral peroneal DVTs, bilateral pleural effusions (negative for PE), acute acalculous cholecystitis. Patient admitted on 01/30/2014 Patient undergoing outpatient kub due to diarrhea, abdominal distention (from rehab center) when he had asystole/PEA arrest with ROSC in 14 minutes. 3/8 weight= 75.3 kg 3/13 patient states still having some SOB , negative CP, positive abdominal and lower extremity swelling. Patient unsure of his dry weight. Patient on 3 L O2 via [place] SpO2= 99%. Patient states does not use home O2 3/14 states still DOE, negative suicidal or homicidal ideation, negative N./V. 3/15 states his work of breathing is improved, but has not been ambulating yet. Negative CP, negative SOB, negative. N./V. Per patient RN he has complained of some difficulty with eating and swallowing food.  3/17 patient sitting up in chair, only complaint is dry nose secondary to the oxygen. Negative CP/SOB as long as he is on his oxygen, negative N./V.     Objective: Filed Vitals:   02/07/14 2008 02/08/14 0500 02/08/14 0611 02/08/14 1345  BP: 145/77  129/89 121/62  Pulse: 78   74 78  Temp: 97.8 F (36.6 C)  98.6 F (37 C) 98.5 F (36.9 C)  TempSrc: Oral  Oral Oral  Resp: 18  18 20   Height:      Weight:  70.716 kg (155 lb 14.4 oz)    SpO2: 98%  99% 97%    Intake/Output Summary (Last 24 hours) at 02/08/14 1828 Last data filed at 02/08/14 1823  Gross per 24 hour  Intake    600 ml  Output   4501 ml  Net  -3901 ml   Filed Weights   02/06/14 0500 02/07/14 0507 02/08/14 0500  Weight: 73.8 kg (162 lb 11.2 oz) 73.936 kg (163 lb) 70.716 kg (155 lb 14.4 oz)    Exam:   General:  A./O. x4, NAD  Cardiovascular: Rhythm and rate, negative murmurs rubs or gallops  Respiratory: Clear to auscultation bilateral   Abdomen: Distended abdomen (improved), nontender, tense, plus bowel sounds (anasarca improved from 3/16)  Psychiatric patient denies suicidal or homicidal ideation. States had suicidal ideations from 2/13-3/13   Musculoskeletal: Pedal edema to 2-3+ to hips bilateral , although tightness of legs and abdomen have decreased   Data Reviewed: Basic Metabolic Panel:  Recent Labs Lab 02/04/14 0457 02/05/14 1339 02/06/14 0500 02/07/14 0452 02/07/14 1445 02/08/14 0502  NA 142 139 139 137  --  140  K 4.0 3.7 3.8 4.2  --  3.7  CL 103 99 99 96  --  95*  CO2 26 30 31  33*  --  36*  GLUCOSE 112* 224* 133* 169*  --  132*  BUN  36* 32* 36* 37*  --  35*  CREATININE 1.51* 1.49* 1.60* 1.43*  --  1.33  CALCIUM 8.3* 8.5 8.5 8.5  --  8.7  MG  --  1.8 1.7 1.7  --  1.6  PHOS  --   --   --   --  3.6  --    Liver Function Tests:  Recent Labs Lab 02/05/14 1339 02/06/14 0500 02/07/14 0452 02/08/14 0502  AST 22 18 32 22  ALT 29 24 29 25   ALKPHOS 323* 287* 319* 292*  BILITOT 0.3 0.3 0.3 0.3  PROT 6.9 6.4 6.5 6.5  ALBUMIN 1.9* 1.9* 1.9* 1.9*   No results found for this basename: LIPASE, AMYLASE,  in the last 168 hours No results found for this basename: AMMONIA,  in the last 168 hours CBC:  Recent Labs Lab 02/05/14 0547 02/05/14 1339 02/06/14 0500  02/07/14 0452 02/08/14 0502  WBC 7.9 9.9 8.0 7.8 7.3  NEUTROABS  --  7.4 5.4 5.7 5.1  HGB 7.5* 8.4* 7.7* 8.1* 7.9*  HCT 23.8* 26.8* 24.3* 26.0* 25.3*  MCV 77.0* 77.5* 77.4* 77.4* 76.7*  PLT 310 345 294 303 297   Cardiac Enzymes: No results found for this basename: CKTOTAL, CKMB, CKMBINDEX, TROPONINI,  in the last 168 hours BNP (last 3 results)  Recent Labs  01/30/14 2028  PROBNP 9165.0*   CBG:  Recent Labs Lab 02/07/14 1632 02/07/14 2104 02/08/14 0741 02/08/14 1142 02/08/14 1612  GLUCAP 187* 166* 127* 185* 184*    Recent Results (from the past 240 hour(s))  MRSA PCR SCREENING     Status: None   Collection Time    01/30/14  6:31 PM      Result Value Ref Range Status   MRSA by PCR NEGATIVE  NEGATIVE Final   Comment:            The GeneXpert MRSA Assay (FDA     approved for NASAL specimens     only), is one component of a     comprehensive MRSA colonization     surveillance program. It is not     intended to diagnose MRSA     infection nor to guide or     monitor treatment for     MRSA infections.  CULTURE, BLOOD (ROUTINE X 2)     Status: None   Collection Time    01/30/14  8:28 PM      Result Value Ref Range Status   Specimen Description BLOOD A-LINE   Final   Special Requests BOTTLES DRAWN AEROBIC AND ANAEROBIC 10CC EACH   Final   Culture  Setup Time     Final   Value: 01/31/2014 08:44     Performed at [facility]   Culture     Final   Value: NO GROWTH 5 DAYS     Performed at [facility]   Report Status 02/06/2014 FINAL   Final  CULTURE, BLOOD (ROUTINE X 2)     Status: None   Collection Time    01/30/14  8:46 PM      Result Value Ref Range Status   Specimen Description BLOOD CENTRAL LINE   Final   Special Requests BOTTLES DRAWN AEROBIC AND ANAEROBIC 10CC EACH   Final   Culture  Setup Time     Final   Value: 01/31/2014 08:46     Performed at [facility]   Culture     Final   Value: NO GROWTH 5  DAYS     Performed at  [facility]   Report Status 02/06/2014 FINAL   Final     Studies: No results found.  Scheduled Meds: . amLODipine  10 mg Oral Daily  . cloNIDine  0.1 mg Oral TID  . enoxaparin (LOVENOX) injection  75 mg Subcutaneous Q12H  . FLUoxetine  10 mg Oral Daily  . furosemide  40 mg Intravenous TID  . hydrALAZINE  50 mg Oral 3 times per day  . insulin aspart  0-15 Units Subcutaneous TID WC  . isosorbide mononitrate  30 mg Oral Daily  . Warfarin - Pharmacist Dosing Inpatient   Does not apply q1800   Continuous Infusions: . sodium chloride Stopped (02/03/14 2256)    Active Problems:   Pulmonary tuberculosis   Hypertension   Diabetic neuropathy   Acute respiratory failure   New onset seizure   Diabetic proliferative retinopathy   DVT of popliteal vein   Acute on chronic diastolic heart failure   Nephrotic syndrome   Cardiac arrest   Suicide ideation    Time spent:40 minute    WOODS, CURTIS, J  [facility] Pager [phone]. If 7PM-7AM, please contact night-coverage at [email or web address], password [facility] 02/08/2014, 6:28 PM  LOS: 9 days

## 2014-02-08 NOTE — Progress Notes (Signed)
ANTICOAGULATION CONSULT NOTE - Follow Up Consult  Pharmacy Consult for lovenox and coumadin Indication: Recent bilateral DVT   Allergies  Allergen Reactions  . Aspirin Itching    Patient Measurements: Height: 5\' 6"  (167.6 cm) Weight: 155 lb 14.4 oz (70.716 kg) IBW/kg (Calculated) : 63.8   Vital Signs: Temp: 98.6 F (37 C) (03/17 0611) Temp src: Oral (03/17 0611) BP: 129/89 mmHg (03/17 0611) Pulse Rate: 74 (03/17 0611)  Labs:  Recent Labs  02/06/14 0500 02/07/14 0452 02/08/14 0502  HGB 7.7* 8.1* 7.9*  HCT 24.3* 26.0* 25.3*  PLT 294 303 297  LABPROT 16.4* 17.5* 18.5*  INR 1.36 1.48 1.59*  CREATININE 1.60* 1.43* 1.33    Estimated Creatinine Clearance: 52 ml/min (by C-G formula based on Cr of 1.33).  Assessment: Patient is a 63 y.o m on heparin and coumadin for recent DVT.  INR is subtherapeutic at 1.59 but is steadily trending up towards goal range.  Goal of Therapy:  INR 2-3 Anti-Xa level 0.6-1 units/ml 4hrs after LMWH dose given Monitor platelets by anticoagulation protocol: Yes   Plan:  1) coumadin 7.5mg  PO x1 today 2) continue lovenox 75mg  SQ q12h  Birttany Dechellis P 02/08/2014,11:54 AM

## 2014-02-08 NOTE — Progress Notes (Signed)
Patient ID: Joshua [name], male   DOB: Aug 07, 1951, 63 y.o.   MRN: [id number]  [facility] Progress Note    Assessment/ Plan:   1. Anasarca with nephrotic syndrome: Underlying nephrotic syndrome appears to be from diabetic kidney disease and has been negative serologically. Good response to lasix overnight with 3.8L UOP- labs stable and actually showing a better creatinine. A long way to go to his "dry weight" 2. Exacerbation of congestive heart failure: Patient with history of diastolic heart failure and recent PEA arrest with negative coronary angiography. Optimize diuresis, continue to follow off negative chronotropic agents.  3. Chronic kidney disease stage III: Renal function appears to be stable-monitor for electrolyte depletion with increased diuresis  4. Hypertension: Slightly elevated but improving with diuresis  5. Anemia: Low Tsat/ferritin- will give IV Fe   Subjective:   Reports to be feeling slightly better today after a fair night   Objective:   BP 129/89  Pulse 74  Temp(Src) 98.6 F (37 C) (Oral)  Resp 18  Ht 5\' 6"  (1.676 m)  Wt 70.716 kg (155 lb 14.4 oz)  BMI 25.18 kg/m2  SpO2 99%  Intake/Output Summary (Last 24 hours) at 02/08/14 0847 Last data filed at 02/08/14 0700  Gross per 24 hour  Intake    240 ml  Output   3800 ml  Net  -3560 ml   Weight change: -3.221 kg (-7 lb 1.6 oz)  Physical Exam: [id number] sitting up in recliner with feet propped [id number] RRR, normal S1 and S2 Resp: Decreased bibasal BS with fine rales right mid-lung Abd: slightly firm and distended, obese, NT, BS normal Ext:2-3+ anasarca  Imaging: Dg Chest Port 1 View  02/06/2014   CLINICAL DATA:  Increasing oxygen requirement  EXAM: PORTABLE CHEST - 1 VIEW  COMPARISON:  Prior chest x-ray 02/02/2014  FINDINGS: Persistently low inspiratory volumes with bibasilar atelectasis. Overall, the pulmonary aeration has improved compared to the prior radiograph likely secondary to  a decrease in interstitial edema. There are persistent left slightly larger than right small layering pleural effusions. The cardiac and mediastinal contours are unchanged. No pneumothorax. No acute osseous abnormality.  IMPRESSION: 1. Overall, improved pulmonary aeration compared to 02/02/2014 likely from an interval decrease in interstitial pulmonary edema. 2. Persistently low inspiratory volumes with small bilateral pleural effusions and bibasilar atelectasis. Superimposed infiltrate is difficult to exclude radiographically.   Electronically Signed   By: Jacqulynn Cadet M.D.   On: 02/06/2014 09:17    Labs: BMET  Recent Labs Lab 02/02/14 0440 02/02/14 1228 02/04/14 0457 02/05/14 1339 02/06/14 0500 02/07/14 0452 02/07/14 1445 02/08/14 0502  NA 140  --  142 139 139 137  --  140  K 4.2  --  4.0 3.7 3.8 4.2  --  3.7  CL 103  --  103 99 99 96  --  95*  CO2 26  --  26 30 31  33*  --  36*  GLUCOSE 160*  --  112* 224* 133* 169*  --  132*  BUN 44*  --  36* 32* 36* 37*  --  35*  CREATININE 1.68* 1.54* 1.51* 1.49* 1.60* 1.43*  --  1.33  CALCIUM 8.5  --  8.3* 8.5 8.5 8.5  --  8.7  PHOS  --   --   --   --   --   --  3.6  --    CBC  Recent Labs Lab 02/05/14 1339 02/06/14 0500 02/07/14 0452 02/08/14 0502  WBC 9.9  8.0 7.8 7.3  NEUTROABS 7.4 5.4 5.7 5.1  HGB 8.4* 7.7* 8.1* 7.9*  HCT 26.8* 24.3* 26.0* 25.3*  MCV 77.5* 77.4* 77.4* 76.7*  PLT 345 294 303 297    Medications:    . amLODipine  10 mg Oral Daily  . cloNIDine  0.1 mg Oral TID  . enoxaparin (LOVENOX) injection  75 mg Subcutaneous Q12H  . FLUoxetine  10 mg Oral Daily  . furosemide  40 mg Intravenous TID  . hydrALAZINE  50 mg Oral 3 times per day  . insulin aspart  0-15 Units Subcutaneous TID WC  . isosorbide mononitrate  30 mg Oral Daily  . Warfarin - Pharmacist Dosing Inpatient   Does not apply [id number]   Elmarie Shiley, MD 02/08/2014, 8:47 AM

## 2014-02-09 ENCOUNTER — Ambulatory Visit (HOSPITAL_COMMUNITY): Payer: PRIVATE HEALTH INSURANCE | Admitting: Speech Pathology

## 2014-02-09 LAB — COMPREHENSIVE METABOLIC PANEL
ALT: 27 U/L (ref 0–53)
AST: 27 U/L (ref 0–37)
Albumin: 2 g/dL — ABNORMAL LOW (ref 3.5–5.2)
Alkaline Phosphatase: 291 U/L — ABNORMAL HIGH (ref 39–117)
BILIRUBIN TOTAL: 0.3 mg/dL (ref 0.3–1.2)
BUN: 38 mg/dL — ABNORMAL HIGH (ref 6–23)
CHLORIDE: 91 meq/L — AB (ref 96–112)
CO2: 35 mEq/L — ABNORMAL HIGH (ref 19–32)
Calcium: 8.8 mg/dL (ref 8.4–10.5)
Creatinine, Ser: 1.21 mg/dL (ref 0.50–1.35)
GFR calc Af Amer: 72 mL/min — ABNORMAL LOW (ref >90)
GFR calc non Af Amer: 62 mL/min — ABNORMAL LOW (ref >90)
Glucose, Bld: 104 mg/dL — ABNORMAL HIGH (ref 70–99)
Potassium: 3.9 mEq/L (ref 3.7–5.3)
Sodium: 138 mEq/L (ref 137–147)
Total Protein: 6.7 g/dL (ref 6.0–8.3)

## 2014-02-09 LAB — CBC WITH DIFFERENTIAL/PLATELET
Basophils Absolute: 0 10*3/uL (ref 0.0–0.1)
Basophils Relative: 0 % (ref 0–1)
EOS ABS: 0.3 10*3/uL (ref 0.0–0.7)
EOS PCT: 5 % (ref 0–5)
HCT: 25.3 % — ABNORMAL LOW (ref 39.0–52.0)
Hemoglobin: 8.1 g/dL — ABNORMAL LOW (ref 13.0–17.0)
LYMPHS ABS: 1.4 10*3/uL (ref 0.7–4.0)
Lymphocytes Relative: 23 % (ref 12–46)
MCH: 24.5 pg — AB (ref 26.0–34.0)
MCHC: 32 g/dL (ref 30.0–36.0)
MCV: 76.4 fL — ABNORMAL LOW (ref 78.0–100.0)
Monocytes Absolute: 0.4 10*3/uL (ref 0.1–1.0)
Monocytes Relative: 7 % (ref 3–12)
Neutro Abs: 4 10*3/uL (ref 1.7–7.7)
Neutrophils Relative %: 66 % (ref 43–77)
PLATELETS: 297 10*3/uL (ref 150–400)
RBC: 3.31 MIL/uL — ABNORMAL LOW (ref 4.22–5.81)
RDW: 19.9 % — ABNORMAL HIGH (ref 11.5–15.5)
WBC: 6.1 10*3/uL (ref 4.0–10.5)

## 2014-02-09 LAB — GLUCOSE, CAPILLARY
GLUCOSE-CAPILLARY: 52 mg/dL — AB (ref 70–99)
GLUCOSE-CAPILLARY: 184 mg/dL — AB (ref 70–99)
Glucose-Capillary: 69 mg/dL — ABNORMAL LOW (ref 70–99)
Glucose-Capillary: 117 mg/dL — ABNORMAL HIGH (ref 70–99)
Glucose-Capillary: 110 mg/dL — ABNORMAL HIGH (ref 70–99)
Glucose-Capillary: 136 mg/dL — ABNORMAL HIGH (ref 70–99)
Glucose-Capillary: 179 mg/dL — ABNORMAL HIGH (ref 70–99)

## 2014-02-09 LAB — PROTIME-INR
INR: 1.53 — ABNORMAL HIGH (ref 0.00–1.49)
PROTHROMBIN TIME: 18 s — AB (ref 11.6–15.2)

## 2014-02-09 LAB — MAGNESIUM: Magnesium: 1.6 mg/dL (ref 1.5–2.5)

## 2014-02-09 MED ORDER — INSULIN ASPART 100 UNIT/ML ~~LOC~~ SOLN
0.0000 [IU] | Freq: Three times a day (TID) | SUBCUTANEOUS | Status: DC
  Administered 2014-02-09 – 2014-02-10 (×2): 2 [IU] via SUBCUTANEOUS
  Administered 2014-02-10: 5 [IU] via SUBCUTANEOUS
  Administered 2014-02-10: 2 [IU] via SUBCUTANEOUS
  Administered 2014-02-11: 5 [IU] via SUBCUTANEOUS
  Administered 2014-02-11 – 2014-02-12 (×2): 2 [IU] via SUBCUTANEOUS
  Administered 2014-02-12 – 2014-02-13 (×3): 3 [IU] via SUBCUTANEOUS
  Administered 2014-02-13: 2 [IU] via SUBCUTANEOUS
  Administered 2014-02-13: 5 [IU] via SUBCUTANEOUS
  Administered 2014-02-14: 2 [IU] via SUBCUTANEOUS
  Administered 2014-02-14: 3 [IU] via SUBCUTANEOUS

## 2014-02-09 MED ORDER — DARBEPOETIN ALFA-POLYSORBATE 100 MCG/0.5ML IJ SOLN
100.0000 ug | INTRAMUSCULAR | Status: DC
  Administered 2014-02-09: 100 ug via SUBCUTANEOUS
  Filled 2014-02-09: qty 0.5

## 2014-02-09 MED ORDER — WARFARIN SODIUM 10 MG PO TABS
10.0000 mg | ORAL_TABLET | Freq: Once | ORAL | Status: AC
  Administered 2014-02-09: 10 mg via ORAL
  Filled 2014-02-09: qty 1

## 2014-02-09 NOTE — Progress Notes (Signed)
CSW is continueing to follow and will assist with discharge when patient is medically stable.  Rhea Pink, MSW, [place]

## 2014-02-09 NOTE — Progress Notes (Addendum)
Physical Therapy Treatment Patient Details Name: Joshua [name] MRN: [id number] DOB: October 14, 1951 Today's Date: 02/09/2014 Time: [id number] PT Time Calculation (min): 19 min  PT Assessment / Plan / Recommendation  History of Present Illness 63 y.o. male admitted to the hospital on 01/30/14 after having sudden cardiac arrest while getting an OP x-ray at [facility].  He was intubated in the ED 01/30/13 and extubated on 01/31/13.  He was noted earlier on day of admission by SNF staff to have increasing abdominal distention and diarrhea. 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  Dx with tachybrady syndrome, asystole arrest, acute respiratory failure, and acute on chronic CHF.  Pt has ongoing issues with left foot wound and wears a [employer] shoe left during mobility.  He was recently d/c from [facility] 01/21/14 due to VDRF and other medical issues including L foot I&D due to non-healing wound/infection.  He was at [facility] getting therapy since he was last d/c from [facility].     PT Comments   Pt continues to be very deconditioned.  He was able to increase gait distance today with a lot of encouragement from the therapist.  He also needed encouragement to try to do things for himself instead of asking [place] to help him (try to put on his own shoes, try to tie his own gown).  Pt continues to be appropriate to return to SNF level rehab at discharge.    Follow Up Recommendations  SNF (from [facility])     Does the patient have the potential to tolerate intense rehabilitation    NA  Barriers to Discharge   None      Equipment Recommendations  Rolling walker with 5" wheels    Recommendations for Other Services   None  Frequency Min 2X/week   Progress towards PT Goals Progress towards PT goals: Progressing toward goals  Plan Frequency needs to be updated    Precautions / Restrictions Precautions Precautions: Fall Precaution Comments: weak, self limiting, [employer] shoe Required Braces or Orthoses: Other  Brace/Splint Other Brace/Splint: D[employer] shoe left foot Restrictions LLE Weight Bearing: Weight bearing as tolerated   Pertinent Vitals/Pain HR and O2 sats stable on 2 L O2 [name].  DOE 2/4 with gait.     Mobility  Transfers Overall transfer level: Needs assistance Equipment used: Rolling walker (2 wheeled) Transfers: Sit to/from Stand Sit to Stand: +2 physical assistance;Mod assist General transfer comment: Two person mod assist to power up from low recliner chair.  Verbal cues to push from arm rest with bil upper extremity.  Pt encouraged to use both arms and legs to push up as he is seemingly only using his arms.  Verbal cues for safe arm and feet placement during transitions.  She donned on right and [employer] donned on left for gait.  Ambulation/Gait Ambulation/Gait assistance: +2 safety/equipment;Min assist Ambulation Distance (Feet): 50 Feet Assistive device: Rolling walker (2 wheeled) Gait Pattern/deviations: Step-to pattern;Decreased stance time - left;Antalgic;Trunk flexed Gait velocity: decreased Gait velocity interpretation: <1.8 ft/sec, indicative of risk for recurrent falls General Gait Details: Pt needed constant cues for upright posture, safe use of RW and to keep his weight through the back of the [place], not through the toes.  Assist at trunk for LOB and chair to follow for encouragement of increased gait distance.  HR and O2 sats stable on 2 L O2 [name].  DOE 2/4 with gait.       PT Goals (current goals can now be found in the  care plan section) Acute Rehab PT Goals Patient Stated Goal: to get stronger so that he can go home and live byhimself again.   Visit Information  Last PT Received On: 02/09/14 Assistance Needed: +2 (chair to follow for increased gait distance) History of Present Illness: 63 y.o. male admitted to the hospital on 01/30/14 after having sudden cardiac arrest while getting an OP x-ray at [facility]  He was intubated in the ED 01/30/13 and extubated on 01/31/13.  He was  noted earlier on day of admission by SNF staff to have increasing abdominal distention and diarrhea. 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  Dx with tachybrady syndrome, asystole arrest, acute respiratory failure, and acute on chronic CHF.  Pt has ongoing issues with left foot wound and wears a [employer] shoe left during mobility.  He was recently d/c from [facility] 01/21/14 due to VDRF and other medical issues including L foot I&D due to non-healing wound/infection.  He was at [facility] getting therapy since he was last d/c from [facility].      Subjective Data  Subjective: Per RN pt can do more than he reports.  Encouraged [place] to try to get him to do more for himself.   Patient Stated Goal: to get stronger so that he can go home and live byhimself again.    Cognition  Cognition Arousal/Alertness: Awake/alert Behavior During Therapy: WFL for tasks assessed/performed Overall Cognitive Status: Impaired/Different from baseline Area of Impairment: Safety/judgement;Awareness;Problem solving Memory: Decreased short-term memory Safety/Judgement: Decreased awareness of safety Awareness: Emergent Problem Solving: Difficulty sequencing;Requires verbal cues;Requires tactile cues General Comments: Needs constant reinforcement of safety cues and safety education in relation to RW use.      Balance  Balance Standing balance support: Bilateral upper extremity supported Standing balance-Leahy Scale: Poor Standing balance comment: relies on bil upper extremity support in standing.    End of Session PT - End of Session Equipment Utilized During Treatment: Gait belt;Oxygen Activity Tolerance: Patient limited by fatigue Patient left: in chair;with call bell/phone within reach     [place] B. [place], [place], DPT [phone]   02/09/2014, 4:26 PM

## 2014-02-09 NOTE — Progress Notes (Signed)
[facility] PROGRESS NOTE  Joshua [name] [id number] DOB: [birth year] DOA: 01/30/2014 PCP: Duwaine Maxin, DO  Assessment/Plan: [name] PMHx  DM type 2 with proliferative retinopathy/neuropathy, CKD STAGE III, HTN, diastolic CHF, Hx tuberculosis, Hx seizures, nephrotic syndrome, Hx DVT popliteal vein. 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 Patient was previously admitted to [facility] on [id number] which was complicated by bradycardia/hypotension, and seizure while in the ICU. Patient was intubated 2/13-2/14. Follow have bilateral peroneal DVTs, bilateral pleural effusions (negative for PE), acute acalculous cholecystitis. Patient admitted on 01/30/2014 Patient undergoing outpatient kub due to diarrhea, abdominal distention (from rehab center) when he had asystole/PEA arrest with ROSC in 14 minutes.  Asystole -Continue to avoid all AV nodal blocking agent. -No further therapy per EP, Dr Lovena Le.   Acute respiratory failure -Patient's continued O2 requirement most likely secondary to diastolic CHF decompensated -See chronic diastolic CHF  Tachybradycardia syndrome -See asystole  Acute/Chronic diastolic CHF -Continue Norvasc 10 mg daily -Continue clonidine tablet 0.1 mg TID -Continue hydralazine 50 mg TID -Continue Imdur 30 mg daily -continued Lasix IV 40 mg TID -Negative 1700 L.  -weight 77---73.  HTN -See diastolic CHF  Seizure -Currently not on seizure medication, patient unsure of any previous medication and cause. -Patient states he was on seizure medication while hospitalized at one point but has never been on outpatient seizure medication  Nephrotic syndrome -3/15 spot protein/creatinine ratio= 3.67 -Continue with IV lasix for 24 to 48 hours. Then transition to oral.                   Diabetes type 2 with retinopathy/neuropathy -Hemoglobin A1c on 11/26/2013= 6.5; within ADA guidelines -episodes of hypoglycemia. Will change SSI  from resistant to moderate.   Suicidal ideation -Per psychiatry No evidence of imminent risk to self or others at present.  D/C sitter  -Patient does not meet criteria for psychiatric inpatient admission.  -Per psychiatry start Fluoxetine 10 mg PO QD for depression  DVT popliteal vein -Continue full dose Lovenox until patient therapeutic on Coumadin  -Continue Coumadin per pharmacy; 3/17 INR= 1.53  Dysphasia? -3/16 speech conduct bedside swallow; recommends Regular;Thin liquid  Liquid Administration via: Cup;Straw  Medication Administration: Whole meds with liquid -3/17 continue carb modified diet       Code Status: Full Family Communication: None available Disposition Plan: SNF when on oral lasix.     Consultants: Dr.JONNALAGADDA,JANARDHAHA R. (psychiatry) Dr. Patel(nephrology)     Procedures: PCXR 02/06/2014 1. Overall, improved pulmonary aeration compared to 02/02/2014 likely  from an interval decrease in interstitial pulmonary edema.  2. Persistently low inspiratory volumes with small bilateral pleural  effusions and bibasilar atelectasis. Superimposed infiltrate is  difficult to exclude radiographically.  PCXR 02/02/2014 Hypoaeration. Central vascular congestion and interstitial and hazy  airspace opacities may reflect edema (favored) versus multifocal  infection.  Small effusions and bibasilar opacities, favor atelectasis.   Echocardiogram 01/31/2014 - Left ventricle: The cavity size was normal. Wall thickness was normal. Systolic function was normal. -LVEF= 55% to 60%.Systolic function was mildly reduced. - Aortic valve: Trivial regurgitation. - Left atrium: The atrium was mildly dilated. - Right ventricle: The cavity size was mildly dilated. - Right atrium: The atrium was moderately dilated. - Atrial septum: No defect or patent foramen ovale was identified.  On 01/14/2014  Normal renal size with increased parenchymal echogenicity,  consistent with medical  renal disease. No evidence of  hydronephrosis.  Left pleural effusion incidentally noted.  Antibiotics:    HPI/Subjective: Patient sitting in chair. Feeling better. Breathing better. Tolerating diet.      Objective: Filed Vitals:   02/08/14 0611 02/08/14 1345 02/08/14 2126 02/09/14 0553  BP: 129/89 121/62 121/62 133/65  Pulse: 74 78 66 74  Temp: 98.6 F (37 C) 98.5 F (36.9 C) 98.1 F (36.7 C)   TempSrc: Oral Oral Axillary   Resp: 18 20  18   Height:      Weight:      SpO2: 99% 97% 98% 92%    Intake/Output Summary (Last 24 hours) at 02/09/14 1323 Last data filed at 02/09/14 0410  Gross per 24 hour  Intake    360 ml  Output   4002 ml  Net  -3642 ml   Filed Weights   02/06/14 0500 02/07/14 0507 02/08/14 0500  Weight: 73.8 kg (162 lb 11.2 oz) 73.936 kg (163 lb) 70.716 kg (155 lb 14.4 oz)    Exam:   General: NAD  Cardiovascular: Rhythm and rate, negative murmurs rubs or gallops  Respiratory: Clear to auscultation bilateral   Abdomen: NT, mild distended, no rigidity.   Musculoskeletal: plus 2 edema.   Data Reviewed: Basic Metabolic Panel:  Recent Labs Lab 02/05/14 1339 02/06/14 0500 02/07/14 0452 02/07/14 1445 02/08/14 0502 02/09/14 0400  NA 139 139 137  --  140 138  K 3.7 3.8 4.2  --  3.7 3.9  CL 99 99 96  --  95* 91*  CO2 30 31 33*  --  36* 35*  GLUCOSE 224* 133* 169*  --  132* 104*  BUN 32* 36* 37*  --  35* 38*  CREATININE 1.49* 1.60* 1.43*  --  1.33 1.21  CALCIUM 8.5 8.5 8.5  --  8.7 8.8  MG 1.8 1.7 1.7  --  1.6 1.6  PHOS  --   --   --  3.6  --   --    Liver Function Tests:  Recent Labs Lab 02/05/14 1339 02/06/14 0500 02/07/14 0452 02/08/14 0502 02/09/14 0400  AST 22 18 32 22 27  ALT 29 24 29 25 27   ALKPHOS 323* 287* 319* 292* 291*  BILITOT 0.3 0.3 0.3 0.3 0.3  PROT 6.9 6.4 6.5 6.5 6.7  ALBUMIN 1.9* 1.9* 1.9* 1.9* 2.0*   No results found for this basename: LIPASE, AMYLASE,  in the last 168 hours No results found for this  basename: AMMONIA,  in the last 168 hours CBC:  Recent Labs Lab 02/05/14 1339 02/06/14 0500 02/07/14 0452 02/08/14 0502 02/09/14 0400  WBC 9.9 8.0 7.8 7.3 6.1  NEUTROABS 7.4 5.4 5.7 5.1 4.0  HGB 8.4* 7.7* 8.1* 7.9* 8.1*  HCT 26.8* 24.3* 26.0* 25.3* 25.3*  MCV 77.5* 77.4* 77.4* 76.7* 76.4*  PLT 345 294 303 297 297   Cardiac Enzymes: No results found for this basename: CKTOTAL, CKMB, CKMBINDEX, TROPONINI,  in the last 168 hours BNP (last 3 results)  Recent Labs  01/30/14 2028  PROBNP 9165.0*   CBG:  Recent Labs Lab 02/09/14 0113 02/09/14 0135 02/09/14 0508 02/09/14 0807 02/09/14 1230  GLUCAP 52* 69* 117* 110* 184*    Recent Results (from the past 240 hour(s))  MRSA PCR SCREENING     Status: None   Collection Time    01/30/14  6:31 PM      Result Value Ref Range Status   MRSA by PCR NEGATIVE  NEGATIVE Final   Comment:            The GeneXpert  MRSA Assay (FDA     approved for NASAL specimens     only), is one component of a     comprehensive MRSA colonization     surveillance program. It is not     intended to diagnose MRSA     infection nor to guide or     monitor treatment for     MRSA infections.  CULTURE, BLOOD (ROUTINE X 2)     Status: None   Collection Time    01/30/14  8:28 PM      Result Value Ref Range Status   Specimen Description BLOOD A-LINE   Final   Special Requests BOTTLES DRAWN AEROBIC AND ANAEROBIC 10CC EACH   Final   Culture  Setup Time     Final   Value: 01/31/2014 08:44     Performed at [facility]   Culture     Final   Value: NO GROWTH 5 DAYS     Performed at [facility]   Report Status 02/06/2014 FINAL   Final  CULTURE, BLOOD (ROUTINE X 2)     Status: None   Collection Time    01/30/14  8:46 PM      Result Value Ref Range Status   Specimen Description BLOOD CENTRAL LINE   Final   Special Requests BOTTLES DRAWN AEROBIC AND ANAEROBIC 10CC EACH   Final   Culture  Setup Time     Final   Value: 01/31/2014 08:46      Performed at [facility]   Culture     Final   Value: NO GROWTH 5 DAYS     Performed at [facility]   Report Status 02/06/2014 FINAL   Final     Studies: No results found.  Scheduled Meds: . amLODipine  10 mg Oral Daily  . cloNIDine  0.1 mg Oral TID  . darbepoetin (ARANESP) injection - NON-DIALYSIS  100 mcg Subcutaneous Q Wed-1800  . enoxaparin (LOVENOX) injection  75 mg Subcutaneous Q12H  . FLUoxetine  10 mg Oral Daily  . furosemide  40 mg Intravenous TID  . hydrALAZINE  50 mg Oral 3 times per day  . insulin aspart  0-20 Units Subcutaneous 6 times per day  . isosorbide mononitrate  30 mg Oral Daily  . warfarin  10 mg Oral ONCE-1800  . Warfarin - Pharmacist Dosing Inpatient   Does not apply q1800   Continuous Infusions: . sodium chloride Stopped (02/03/14 2256)    Active Problems:   Pulmonary tuberculosis   Hypertension   Diabetic neuropathy   Acute respiratory failure   New onset seizure   Diabetic proliferative retinopathy   DVT of popliteal vein   Acute on chronic diastolic heart failure   Nephrotic syndrome   Cardiac arrest   Suicide ideation    Time spent:25 minute    Brecken Walth, [facility] Pager [phone]. If 7PM-7AM, please contact night-coverage at [email or web address], password [facility] 02/09/2014, 1:23 PM  LOS: 10 days

## 2014-02-09 NOTE — Progress Notes (Signed)
Patient ID: Joshua [name], male   DOB: December 14, 1950, 63 y.o.   MRN: [id number]   [facility] Progress Note    Assessment/ Plan:   1. Anasarca with nephrotic syndrome: Underlying nephrotic syndrome appears to be from diabetic kidney disease and has been negative serologically. Continues to have a good response to furosemide (surprisingly with continued improvement of creatinine). Maintain current dose for at least 24-48hrs and then change to BID for 24 hrs and then PO. May be able to start ACE-I or ARB in the near future.  2. Exacerbation of congestive heart failure: Patient with history of diastolic heart failure and recent PEA arrest with negative coronary angiography. Off negative chronotropic agents. CHF improving with diuresis. 3. Chronic kidney disease stage III: Renal function appears to be stable-monitor for electrolyte depletion with increased diuresis  4. Hypertension: Slightly elevated but improving with diuresis  5. Anemia:S/P IV Fe, start ESA   Subjective:   Reports some left sided chest pain that is aggravated by movement- no radiation/diaphoresis   Objective:   BP 133/65  Pulse 74  Temp(Src) 98.1 F (36.7 C) (Axillary)  Resp 18  Ht 5\' 6"  (1.676 m)  Wt 70.716 kg (155 lb 14.4 oz)  BMI 25.18 kg/m2  SpO2 92%  Intake/Output Summary (Last 24 hours) at 02/09/14 1002 Last data filed at 02/09/14 0410  Gross per 24 hour  Intake    360 ml  Output   4002 ml  Net  -3642 ml   Weight change:   Physical Exam: [id number]- sitting on BSC [id number] RRR, S1 and S2 with ESM Resp:Fine basal rales- no wheeze [id number], NT, BS normal Ext:2+ LE edema (Improved since Monday)  Imaging: No results found.  Labs: BMET  Recent Labs Lab 02/02/14 1228 02/04/14 0457 02/05/14 1339 02/06/14 0500 02/07/14 0452 02/07/14 1445 02/08/14 0502 02/09/14 0400  NA  --  142 139 139 137  --  140 138  K  --  4.0 3.7 3.8 4.2  --  3.7 3.9  CL  --  103 99 99 96  --  95* 91*   CO2  --  26 30 31  33*  --  36* 35*  GLUCOSE  --  112* 224* 133* 169*  --  132* 104*  BUN  --  36* 32* 36* 37*  --  35* 38*  CREATININE 1.54* 1.51* 1.49* 1.60* 1.43*  --  1.33 1.21  CALCIUM  --  8.3* 8.5 8.5 8.5  --  8.7 8.8  PHOS  --   --   --   --   --  3.6  --   --    CBC  Recent Labs Lab 02/06/14 0500 02/07/14 0452 02/08/14 0502 02/09/14 0400  WBC 8.0 7.8 7.3 6.1  NEUTROABS 5.4 5.7 5.1 4.0  HGB 7.7* 8.1* 7.9* 8.1*  HCT 24.3* 26.0* 25.3* 25.3*  MCV 77.4* 77.4* 76.7* 76.4*  PLT 294 303 297 297    Medications:    . amLODipine  10 mg Oral Daily  . cloNIDine  0.1 mg Oral TID  . enoxaparin (LOVENOX) injection  75 mg Subcutaneous Q12H  . FLUoxetine  10 mg Oral Daily  . furosemide  40 mg Intravenous TID  . hydrALAZINE  50 mg Oral 3 times per day  . insulin aspart  0-20 Units Subcutaneous 6 times per day  . isosorbide mononitrate  30 mg Oral Daily  . Warfarin - Pharmacist Dosing Inpatient   Does not apply q1800   Elmarie Shiley,  MD 02/09/2014, 10:02 AM

## 2014-02-09 NOTE — Progress Notes (Signed)
ANTICOAGULATION CONSULT NOTE - Follow Up Consult  Pharmacy Consult for Lovenox and Coumadin Indication: Recent bilateral DVT   Allergies  Allergen Reactions  . Aspirin Itching    Patient Measurements: Height: 5\' 6"  (167.6 cm) Weight: 155 lb 14.4 oz (70.716 kg) IBW/kg (Calculated) : 63.8   Vital Signs: BP: 133/65 mmHg (03/18 0553) Pulse Rate: 74 (03/18 0553)  Labs:  Recent Labs  02/07/14 0452 02/08/14 0502 02/09/14 0400  HGB 8.1* 7.9* 8.1*  HCT 26.0* 25.3* 25.3*  PLT 303 297 297  LABPROT 17.5* 18.5* 18.0*  INR 1.48 1.59* 1.53*  CREATININE 1.43* 1.33 1.21    Estimated Creatinine Clearance: 57.1 ml/min (by C-G formula based on Cr of 1.21).  Assessment: Patient is a 63 yo male on Lovenox and coumadin for recent DVT.  INR is subtherapeutic at 1.53 and is unchanged from 3/17.  His INR has been slow to respond likely due to Vitamin K received on 3/9 and 3/10.  Goal of Therapy:  INR 2-3 Anti-Xa level 0.6-1 units/ml 4hrs after LMWH dose given Monitor platelets by anticoagulation protocol: Yes   Plan:  1) coumadin 10mg  PO x1 today 2) continue lovenox 75mg  SQ q12h  Legrand Como, Pharm.D., BCPS, AAHIVP Clinical Pharmacist Phone: [phone] or [phone] 02/09/2014, 10:27 AM

## 2014-02-10 LAB — COMPREHENSIVE METABOLIC PANEL
ALT: 23 U/L (ref 0–53)
AST: 23 U/L (ref 0–37)
Albumin: 2 g/dL — ABNORMAL LOW (ref 3.5–5.2)
Alkaline Phosphatase: 269 U/L — ABNORMAL HIGH (ref 39–117)
BUN: 35 mg/dL — ABNORMAL HIGH (ref 6–23)
CALCIUM: 8.8 mg/dL (ref 8.4–10.5)
CHLORIDE: 91 meq/L — AB (ref 96–112)
CO2: 35 meq/L — AB (ref 19–32)
Creatinine, Ser: 1.2 mg/dL (ref 0.50–1.35)
GFR calc Af Amer: 73 mL/min — ABNORMAL LOW (ref >90)
GFR, EST NON AFRICAN AMERICAN: 63 mL/min — AB (ref >90)
Glucose, Bld: 122 mg/dL — ABNORMAL HIGH (ref 70–99)
Potassium: 3.2 mEq/L — ABNORMAL LOW (ref 3.7–5.3)
SODIUM: 137 meq/L (ref 137–147)
Total Bilirubin: 0.3 mg/dL (ref 0.3–1.2)
Total Protein: 6.6 g/dL (ref 6.0–8.3)

## 2014-02-10 LAB — CBC WITH DIFFERENTIAL/PLATELET
BASOS PCT: 0 % (ref 0–1)
Basophils Absolute: 0 10*3/uL (ref 0.0–0.1)
EOS ABS: 0.1 10*3/uL (ref 0.0–0.7)
EOS PCT: 1 % (ref 0–5)
HCT: 26.1 % — ABNORMAL LOW (ref 39.0–52.0)
HEMOGLOBIN: 8.2 g/dL — AB (ref 13.0–17.0)
LYMPHS ABS: 1 10*3/uL (ref 0.7–4.0)
Lymphocytes Relative: 7 % — ABNORMAL LOW (ref 12–46)
MCH: 24.5 pg — AB (ref 26.0–34.0)
MCHC: 31.4 g/dL (ref 30.0–36.0)
MCV: 77.9 fL — AB (ref 78.0–100.0)
MONO ABS: 1.5 10*3/uL — AB (ref 0.1–1.0)
MONOS PCT: 10 % (ref 3–12)
Neutro Abs: 12.1 10*3/uL — ABNORMAL HIGH (ref 1.7–7.7)
Neutrophils Relative %: 83 % — ABNORMAL HIGH (ref 43–77)
Platelets: 279 10*3/uL (ref 150–400)
RBC: 3.35 MIL/uL — AB (ref 4.22–5.81)
RDW: 19.9 % — ABNORMAL HIGH (ref 11.5–15.5)
WBC: 14.7 10*3/uL — ABNORMAL HIGH (ref 4.0–10.5)

## 2014-02-10 LAB — PROTIME-INR
INR: 1.78 — ABNORMAL HIGH (ref 0.00–1.49)
Prothrombin Time: 20.2 seconds — ABNORMAL HIGH (ref 11.6–15.2)

## 2014-02-10 LAB — GLUCOSE, CAPILLARY
Glucose-Capillary: 135 mg/dL — ABNORMAL HIGH (ref 70–99)
Glucose-Capillary: 135 mg/dL — ABNORMAL HIGH (ref 70–99)
Glucose-Capillary: 203 mg/dL — ABNORMAL HIGH (ref 70–99)
Glucose-Capillary: 222 mg/dL — ABNORMAL HIGH (ref 70–99)

## 2014-02-10 LAB — MAGNESIUM: Magnesium: 1.6 mg/dL (ref 1.5–2.5)

## 2014-02-10 LAB — CLOSTRIDIUM DIFFICILE BY PCR: Toxigenic C. Difficile by PCR: POSITIVE — AB

## 2014-02-10 MED ORDER — WARFARIN SODIUM 10 MG PO TABS
10.0000 mg | ORAL_TABLET | Freq: Once | ORAL | Status: AC
  Administered 2014-02-10: 10 mg via ORAL
  Filled 2014-02-10: qty 1

## 2014-02-10 MED ORDER — METRONIDAZOLE 500 MG PO TABS
500.0000 mg | ORAL_TABLET | Freq: Three times a day (TID) | ORAL | Status: DC
  Administered 2014-02-10 – 2014-02-11 (×3): 500 mg via ORAL
  Filled 2014-02-10 (×7): qty 1

## 2014-02-10 MED ORDER — POTASSIUM CHLORIDE CRYS ER 20 MEQ PO TBCR
40.0000 meq | EXTENDED_RELEASE_TABLET | Freq: Once | ORAL | Status: AC
  Administered 2014-02-10: 40 meq via ORAL
  Filled 2014-02-10: qty 2

## 2014-02-10 NOTE — Progress Notes (Signed)
ANTICOAGULATION CONSULT NOTE - Follow Up Consult  Pharmacy Consult for Lovenox and Coumadin Indication: Recent bilateral DVT   Allergies  Allergen Reactions  . Aspirin Itching    Patient Measurements: Height: 5\' 6"  (167.6 cm) Weight: 145 lb 12.8 oz (66.134 kg) IBW/kg (Calculated) : 63.8   Vital Signs: Temp: 98.2 F (36.8 C) (03/19 0610) Temp src: Oral (03/19 0610) BP: 130/65 mmHg (03/19 0610) Pulse Rate: 80 (03/19 0610)  Labs:  Recent Labs  02/08/14 0502 02/09/14 0400  HGB 7.9* 8.1*  HCT 25.3* 25.3*  PLT 297 297  LABPROT 18.5* 18.0*  INR 1.59* 1.53*  CREATININE 1.33 1.21    Estimated Creatinine Clearance: 57.1 ml/min (by C-G formula based on Cr of 1.21).  Assessment: 63 yo male admitted on 3/8 after cardiac arrest during Xray for abd pain and distention.  Problems: cardiac arrest, hx DVT/possible PE, wound infection  Anticoag: Love/Warf-Rx: lower extremity DVT on 2/13; INR 1.78 up. - Admit INR INR 4.68 - Vit K 5mg  given on 3/9 and 3/10 - Considered acute PE in recent setting of DVT and PEA but VQ scan neg for PE. Bilateral duplex neg. D-dimer 1.76 - PTA Coumadin 5mg  alternating with 7.5mg , last dose 7.5mg  on 3/7 (came in with elevated INR)  ID: afebrile,UA neg; Abx for LLE wound infection d/c. 3/19 noting foul smelling loose stools. Check Cdiff. WBC 6.1>>14.7 overnight.  Levaquin 3/8>>3/9 Vancomycin 3/9>>3/10 Zosyn 3/9>> 3/11  2/13 blood cx: 1/2 coag negative staph 2/17 left foot wound: MSSA 2/23 left foot abscess: MSSA 3/8 Cdiff: negative 3/8 blood cx: ngtd  Goal: INR 2-3  Plan:  1) Repeat Coumadin 10mg  po x 1 today. 2) continue lovenox 75mg  SQ q12h 3) **No Flagyl please when pt is on Coumadin**    Anwitha Mapes S. Alford Highland, PharmD, [place] Clinical Staff Pharmacist Pager [phone]  02/10/2014, 8:25 AM

## 2014-02-10 NOTE — Progress Notes (Signed)
[facility] PROGRESS NOTE  Joshua [name] [id number] DOB: Sep 06, 1951 DOA: 01/30/2014 PCP: Duwaine Maxin, DO  Assessment/Plan: [name] PMHx  DM type 2 with proliferative retinopathy/neuropathy, CKD STAGE III, HTN, diastolic CHF, Hx tuberculosis, Hx seizures, nephrotic syndrome, Hx DVT popliteal vein. 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 Patient was previously admitted to [facility] on [id number] which was complicated by bradycardia/hypotension, and seizure while in the ICU. Patient was intubated 2/13-2/14. Follow have bilateral peroneal DVTs, bilateral pleural effusions (negative for PE), acute acalculous cholecystitis. Patient admitted on 01/30/2014 Patient undergoing outpatient kub due to diarrhea, abdominal distention (from rehab center) when he had asystole/PEA arrest with ROSC in 14 minutes.  Acute/Chronic diastolic CHF -Continue Norvasc 10 mg daily -Continue clonidine tablet 0.1 mg TID -Continue hydralazine 50 mg TID holder parameter to avoid hypotension.  -Continue Imdur 30 mg daily -continued Lasix IV 40 mg TID, plan to transition to oral lasix 3-20.  -Negative 2300 L.  -weight 77---73---66  C diff colitis;  Patient develop diarrhea 3-18 overnight. C diff positive.  Started on Flagyl 3-19.  Day 1 of flagyl.   Left foot wound;  Does not appears infected. He has small amount of bleeding from wound.  Wound care consulted for local care.   Nephrotic syndrome -3/15 spot protein/creatinine ratio= 3.67 -Continue with IV lasix for 24 to 48 hours. Then transition to oral.  -Appreciate Dr Posey Pronto help.   Asystole -Continue to avoid all AV nodal blocking agent. -No further therapy per EP, Dr Lovena Le.   Acute respiratory failure -Patient's continued O2 requirement most likely secondary to diastolic CHF decompensated -See chronic diastolic CHF  Tachybradycardia syndrome -See asystole  HTN -See diastolic CHF  Seizure -Currently not  on seizure medication, patient unsure of any previous medication and cause. -Patient states he was on seizure medication while hospitalized at one point but has never been on outpatient seizure medication  Diabetes type 2 with retinopathy/neuropathy -Hemoglobin A1c on 11/26/2013= 6.5; within ADA guidelines -episodes of hypoglycemia. Will change SSI from resistant to moderate.   Suicidal ideation -Per psychiatry No evidence of imminent risk to self or others at present.  D/C sitter  -Patient does not meet criteria for psychiatric inpatient admission.  -Per psychiatry start Fluoxetine 10 mg PO QD for depression  DVT popliteal vein -Continue full dose Lovenox until patient therapeutic on Coumadin  -Continue Coumadin per pharmacy; 3/17 INR= 1.53  Dysphasia? -3/16 speech conduct bedside swallow; recommends Regular;Thin liquid  Liquid Administration via: Cup;Straw  Medication Administration: Whole meds with liquid -3/17 continue carb modified diet   Code Status: Full Family Communication: None available Disposition Plan: SNF when on oral lasix.     Consultants: Dr.JONNALAGADDA,JANARDHAHA R. (psychiatry) Dr. Patel(nephrology)     Procedures: PCXR 02/06/2014 1. Overall, improved pulmonary aeration compared to 02/02/2014 likely  from an interval decrease in interstitial pulmonary edema.  2. Persistently low inspiratory volumes with small bilateral pleural  effusions and bibasilar atelectasis. Superimposed infiltrate is  difficult to exclude radiographically.  PCXR 02/02/2014 Hypoaeration. Central vascular congestion and interstitial and hazy  airspace opacities may reflect edema (favored) versus multifocal  infection.  Small effusions and bibasilar opacities, favor atelectasis.   Echocardiogram 01/31/2014 - Left ventricle: The cavity size was normal. Wall thickness was normal. Systolic function was normal. -LVEF= 55% to 60%.Systolic function was mildly reduced. - Aortic valve:  Trivial regurgitation. - Left atrium: The atrium was mildly dilated. - Right ventricle: The cavity size was mildly  dilated. - Right atrium: The atrium was moderately dilated. - Atrial septum: No defect or patent foramen ovale was identified.  On 01/14/2014  Normal renal size with increased parenchymal echogenicity,  consistent with medical renal disease. No evidence of  hydronephrosis.  Left pleural effusion incidentally noted.    Antibiotics:    HPI/Subjective: He is very uncomfortable due to frequent BM.    Objective: Filed Vitals:   02/09/14 0553 02/09/14 2020 02/10/14 0500 02/10/14 0610  BP: 133/65 105/50  130/65  Pulse: 74 88  80  Temp:  98.8 F (37.1 C)  98.2 F (36.8 C)  TempSrc:  Oral  Oral  Resp: 18 18  18   Height:      Weight:   66.134 kg (145 lb 12.8 oz)   SpO2: 92% 99%  99%    Intake/Output Summary (Last 24 hours) at 02/10/14 1440 Last data filed at 02/10/14 1030  Gross per 24 hour  Intake      0 ml  Output   2301 ml  Net  -2301 ml   Filed Weights   02/07/14 0507 02/08/14 0500 02/10/14 0500  Weight: 73.936 kg (163 lb) 70.716 kg (155 lb 14.4 oz) 66.134 kg (145 lb 12.8 oz)    Exam:   General: NAD  Cardiovascular: Rhythm and rate, negative murmurs rubs or gallops  Respiratory: Clear to auscultation bilateral   Abdomen: NT, mild distended, no rigidity.   Musculoskeletal: plus 2 edema.   Data Reviewed: Basic Metabolic Panel:  Recent Labs Lab 02/06/14 0500 02/07/14 0452 02/07/14 1445 02/08/14 0502 02/09/14 0400 02/10/14 0825  NA 139 137  --  140 138 137  K 3.8 4.2  --  3.7 3.9 3.2*  CL 99 96  --  95* 91* 91*  CO2 31 33*  --  36* 35* 35*  GLUCOSE 133* 169*  --  132* 104* 122*  BUN 36* 37*  --  35* 38* 35*  CREATININE 1.60* 1.43*  --  1.33 1.21 1.20  CALCIUM 8.5 8.5  --  8.7 8.8 8.8  MG 1.7 1.7  --  1.6 1.6 1.6  PHOS  --   --  3.6  --   --   --    Liver Function Tests:  Recent Labs Lab 02/06/14 0500 02/07/14 0452  02/08/14 0502 02/09/14 0400 02/10/14 0825  AST 18 32 22 27 23   ALT 24 29 25 27 23   ALKPHOS 287* 319* 292* 291* 269*  BILITOT 0.3 0.3 0.3 0.3 0.3  PROT 6.4 6.5 6.5 6.7 6.6  ALBUMIN 1.9* 1.9* 1.9* 2.0* 2.0*   No results found for this basename: LIPASE, AMYLASE,  in the last 168 hours No results found for this basename: AMMONIA,  in the last 168 hours CBC:  Recent Labs Lab 02/06/14 0500 02/07/14 0452 02/08/14 0502 02/09/14 0400 02/10/14 0825  WBC 8.0 7.8 7.3 6.1 14.7*  NEUTROABS 5.4 5.7 5.1 4.0 12.1*  HGB 7.7* 8.1* 7.9* 8.1* 8.2*  HCT 24.3* 26.0* 25.3* 25.3* 26.1*  MCV 77.4* 77.4* 76.7* 76.4* 77.9*  PLT 294 303 297 297 279   Cardiac Enzymes: No results found for this basename: CKTOTAL, CKMB, CKMBINDEX, TROPONINI,  in the last 168 hours BNP (last 3 results)  Recent Labs  01/30/14 2028  PROBNP 9165.0*   CBG:  Recent Labs Lab 02/09/14 1230 02/09/14 1630 02/09/14 2018 02/10/14 0736 02/10/14 1131  GLUCAP 184* 136* 179* 135* 135*    Recent Results (from the past 240 hour(s))  CLOSTRIDIUM DIFFICILE BY PCR  Status: Abnormal   Collection Time    02/10/14  9:39 AM      Result Value Ref Range Status   C difficile by pcr POSITIVE (*) NEGATIVE Final   Comment: CRITICAL RESULT CALLED TO, READ BACK BY AND VERIFIED WITH:     Bayard Beaver RN 11:35 02/10/14 ([email or web address])     Studies: No results found.  Scheduled Meds: . amLODipine  10 mg Oral Daily  . cloNIDine  0.1 mg Oral TID  . darbepoetin (ARANESP) injection - NON-DIALYSIS  100 mcg Subcutaneous Q Wed-1800  . enoxaparin (LOVENOX) injection  75 mg Subcutaneous Q12H  . FLUoxetine  10 mg Oral Daily  . furosemide  40 mg Intravenous TID  . hydrALAZINE  50 mg Oral 3 times per day  . insulin aspart  0-15 Units Subcutaneous TID WC  . isosorbide mononitrate  30 mg Oral Daily  . metroNIDAZOLE  500 mg Oral 3 times per day  . potassium chloride  40 mEq Oral Once  . warfarin  10 mg Oral ONCE-1800  . Warfarin - Pharmacist  Dosing Inpatient   Does not apply q1800   Continuous Infusions: . sodium chloride Stopped (02/03/14 2256)    Active Problems:   Pulmonary tuberculosis   Hypertension   Diabetic neuropathy   Acute respiratory failure   New onset seizure   Diabetic proliferative retinopathy   DVT of popliteal vein   Acute on chronic diastolic heart failure   Nephrotic syndrome   Cardiac arrest   Suicide ideation    Time spent:25 minute    [place], [place] [facility] Pager [phone]. If 7PM-7AM, please contact night-coverage at [email or web address], password [facility] 02/10/2014, 2:40 PM  LOS: 11 days

## 2014-02-10 NOTE — Progress Notes (Signed)
Patient ID: Joshua [name], male   DOB: 09/29/1951, 63 y.o.   MRN: [id number]   [facility] Progress Note    Assessment/ Plan:   1. 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 Continues to have a good response to furosemide- labs pending from this morning. Maintain current dose for today and then change to BID PO tomorrow. May be able to start ACE-I or ARB in the near future.  2. Exacerbation of congestive heart failure: Patient with history of diastolic heart failure and recent PEA arrest with negative coronary angiography. Off negative chronotropic agents due to bradycardia. CHF improving with diuresis.  3. Chronic kidney disease stage III: Renal function test pending-monitor for electrolyte depletion with increased diuresis  4. Hypertension: Acceptable on current regime with diuresis  5. Anemia:S/P IV Fe, start ESA  Subjective:   Reports that "swelling is coming down" and breathing/abdominal distension are better   Objective:   BP 130/65  Pulse 80  Temp(Src) 98.2 F (36.8 C) (Oral)  Resp 18  Ht 5\' 6"  (1.676 m)  Wt 66.134 kg (145 lb 12.8 oz)  BMI 23.54 kg/m2  SpO2 99%  Intake/Output Summary (Last 24 hours) at 02/10/14 0855 Last data filed at 02/10/14 0116  Gross per 24 hour  Intake      0 ml  Output   2300 ml  Net  -2300 ml   Weight change:   Physical Exam: [id number] resting in bed [id number] RRR, normal S1 and S2 Resp:Decreased bibasal breath sounds, no rales [id number], distended, NT, BS normal Ext:1-2+ LE edema bilaterally  Imaging: No results found.  Labs: BMET  Recent Labs Lab 02/04/14 0457 02/05/14 1339 02/06/14 0500 02/07/14 0452 02/07/14 1445 02/08/14 0502 02/09/14 0400  NA 142 139 139 137  --  140 138  K 4.0 3.7 3.8 4.2  --  3.7 3.9  CL 103 99 99 96  --  95* 91*  CO2 26 30 31  33*  --  36* 35*  GLUCOSE 112* 224* 133* 169*  --  132* 104*   BUN 36* 32* 36* 37*  --  35* 38*  CREATININE 1.51* 1.49* 1.60* 1.43*  --  1.33 1.21  CALCIUM 8.3* 8.5 8.5 8.5  --  8.7 8.8  PHOS  --   --   --   --  3.6  --   --    CBC  Recent Labs Lab 02/06/14 0500 02/07/14 0452 02/08/14 0502 02/09/14 0400  WBC 8.0 7.8 7.3 6.1  NEUTROABS 5.4 5.7 5.1 4.0  HGB 7.7* 8.1* 7.9* 8.1*  HCT 24.3* 26.0* 25.3* 25.3*  MCV 77.4* 77.4* 76.7* 76.4*  PLT 294 303 297 297    Medications:    . amLODipine  10 mg Oral Daily  . cloNIDine  0.1 mg Oral TID  . darbepoetin (ARANESP) injection - NON-DIALYSIS  100 mcg Subcutaneous Q Wed-1800  . enoxaparin (LOVENOX) injection  75 mg Subcutaneous Q12H  . FLUoxetine  10 mg Oral Daily  . furosemide  40 mg Intravenous TID  . hydrALAZINE  50 mg Oral 3 times per day  . insulin aspart  0-15 Units Subcutaneous TID WC  . isosorbide mononitrate  30 mg Oral Daily  . Warfarin - Pharmacist Dosing Inpatient   Does not apply [id number]      Elmarie Shiley, MD 02/10/2014, 8:55 AM

## 2014-02-10 NOTE — Progress Notes (Signed)
CSW updated [facility] on patient's status. Per MD, discharge is likely tomorrow.   Rhea Pink, MSW, [place]

## 2014-02-11 DIAGNOSIS — A0472 Enterocolitis due to Clostridium difficile, not specified as recurrent: Secondary | ICD-10-CM

## 2014-02-11 LAB — BASIC METABOLIC PANEL
BUN: 38 mg/dL — ABNORMAL HIGH (ref 6–23)
CO2: 36 meq/L — AB (ref 19–32)
Calcium: 8.7 mg/dL (ref 8.4–10.5)
Chloride: 93 mEq/L — ABNORMAL LOW (ref 96–112)
Creatinine, Ser: 1.39 mg/dL — ABNORMAL HIGH (ref 0.50–1.35)
GFR calc non Af Amer: 53 mL/min — ABNORMAL LOW (ref >90)
GFR, EST AFRICAN AMERICAN: 61 mL/min — AB (ref >90)
GLUCOSE: 106 mg/dL — AB (ref 70–99)
POTASSIUM: 3.3 meq/L — AB (ref 3.7–5.3)
Sodium: 138 mEq/L (ref 137–147)

## 2014-02-11 LAB — GLUCOSE, CAPILLARY
GLUCOSE-CAPILLARY: 125 mg/dL — AB (ref 70–99)
Glucose-Capillary: 214 mg/dL — ABNORMAL HIGH (ref 70–99)
Glucose-Capillary: 93 mg/dL (ref 70–99)
Glucose-Capillary: 188 mg/dL — ABNORMAL HIGH (ref 70–99)

## 2014-02-11 LAB — CBC WITH DIFFERENTIAL/PLATELET
Basophils Absolute: 0 10*3/uL (ref 0.0–0.1)
Basophils Relative: 0 % (ref 0–1)
Eosinophils Absolute: 0.3 10*3/uL (ref 0.0–0.7)
Eosinophils Relative: 3 % (ref 0–5)
HEMATOCRIT: 26.4 % — AB (ref 39.0–52.0)
HEMOGLOBIN: 8.2 g/dL — AB (ref 13.0–17.0)
LYMPHS ABS: 1.5 10*3/uL (ref 0.7–4.0)
LYMPHS PCT: 16 % (ref 12–46)
MCH: 24.3 pg — ABNORMAL LOW (ref 26.0–34.0)
MCHC: 31.1 g/dL (ref 30.0–36.0)
MCV: 78.3 fL (ref 78.0–100.0)
MONO ABS: 1 10*3/uL (ref 0.1–1.0)
MONOS PCT: 10 % (ref 3–12)
NEUTROS ABS: 6.8 10*3/uL (ref 1.7–7.7)
Neutrophils Relative %: 71 % (ref 43–77)
Platelets: 270 10*3/uL (ref 150–400)
RBC: 3.37 MIL/uL — ABNORMAL LOW (ref 4.22–5.81)
RDW: 20.2 % — ABNORMAL HIGH (ref 11.5–15.5)
WBC: 9.6 10*3/uL (ref 4.0–10.5)

## 2014-02-11 LAB — PROTIME-INR
INR: 1.97 — ABNORMAL HIGH (ref 0.00–1.49)
Prothrombin Time: 21.8 seconds — ABNORMAL HIGH (ref 11.6–15.2)

## 2014-02-11 LAB — MAGNESIUM: Magnesium: 1.8 mg/dL (ref 1.5–2.5)

## 2014-02-11 MED ORDER — FUROSEMIDE 40 MG PO TABS: 40.0000 mg | ORAL_TABLET | Freq: Two times a day (BID) | ORAL | Status: DC

## 2014-02-11 MED ORDER — POTASSIUM CHLORIDE CRYS ER 20 MEQ PO TBCR
40.0000 meq | EXTENDED_RELEASE_TABLET | Freq: Once | ORAL | Status: AC
  Administered 2014-02-11: 40 meq via ORAL
  Filled 2014-02-11: qty 2

## 2014-02-11 MED ORDER — VANCOMYCIN 50 MG/ML ORAL SOLUTION
125.0000 mg | Freq: Four times a day (QID) | ORAL | Status: DC
  Administered 2014-02-11 – 2014-02-14 (×12): 125 mg via ORAL
  Filled 2014-02-11 (×18): qty 2.5

## 2014-02-11 MED ORDER — FLUOXETINE HCL 10 MG PO CAPS: 10.0000 mg | ORAL_CAPSULE | Freq: Every day | ORAL | Status: DC

## 2014-02-11 MED ORDER — DARBEPOETIN ALFA-POLYSORBATE 100 MCG/0.5ML IJ SOLN: 100.0000 ug | INTRAMUSCULAR | Status: DC

## 2014-02-11 MED ORDER — WARFARIN SODIUM 5 MG PO TABS
5.0000 mg | ORAL_TABLET | Freq: Once | ORAL | Status: AC
  Administered 2014-02-11: 5 mg via ORAL
  Filled 2014-02-11: qty 1

## 2014-02-11 MED ORDER — CLONIDINE HCL 0.1 MG PO TABS: 0.1000 mg | ORAL_TABLET | Freq: Three times a day (TID) | ORAL | Status: DC

## 2014-02-11 MED ORDER — VANCOMYCIN 50 MG/ML ORAL SOLUTION: 125.0000 mg | Freq: Four times a day (QID) | ORAL | Status: DC

## 2014-02-11 MED ORDER — FUROSEMIDE 40 MG PO TABS
40.0000 mg | ORAL_TABLET | Freq: Two times a day (BID) | ORAL | Status: DC
  Administered 2014-02-11 – 2014-02-14 (×7): 40 mg via ORAL
  Filled 2014-02-11 (×9): qty 1

## 2014-02-11 MED ORDER — ENOXAPARIN SODIUM 80 MG/0.8ML ~~LOC~~ SOLN
65.0000 mg | Freq: Two times a day (BID) | SUBCUTANEOUS | Status: DC
  Administered 2014-02-11 – 2014-02-12 (×2): 65 mg via SUBCUTANEOUS
  Filled 2014-02-11 (×4): qty 0.8

## 2014-02-11 NOTE — Progress Notes (Signed)
Patient ID: Otila Kluver, male   DOB: 11-Jan-1951, 63 y.o.   MRN: [id number]   [facility] Progress Note   Assessment/ Plan:   1. 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 Diuresing well and converted to oral Lasix 40 mg twice a day. Unfortunately, GI losses contributing to brisk volume contraction/electrolyte depletion.  2. Exacerbation of congestive heart failure: Patient with history of diastolic heart failure and recent PEA arrest with negative coronary angiography. Off negative chronotropic agents due to bradycardia. CHF improving with diuresis.  3. Chronic kidney disease stage III: Renal function slightly worse today-probably from diarrhea losses in addition to ongoing diuresis 4. Hypertension: Acceptable on current regime with diuresis  5. Anemia:S/P IV Fe, started on Aranesp. 6. Clostridium difficile colitis: Started on oral metronidazole therapy after stool studies positive for C. difficile. Also on contact precautions. Per patient, still having diarrhea but frequency is improved.  Subjective:   Reports diarrhea overnight-feels better with regards to leg swelling/abdominal swelling    Objective:   BP 112/61  Pulse 66  Temp(Src) 98.3 F (36.8 C) (Oral)  Resp 20  Ht 5\' 6"  (1.676 m)  Wt 65.499 kg (144 lb 6.4 oz)  BMI 23.32 kg/m2  SpO2 100%  Intake/Output Summary (Last 24 hours) at 02/11/14 [id number] Last data filed at 02/11/14 0440  Gross per 24 hour  Intake      0 ml  Output    701 ml  Net   -701 ml   Weight change: -0.635 kg (-1 lb 6.4 oz)  Physical Exam: Gen: Sitting comfortably in his recliner-feet dependent CVS: Pulse regular in rate and rhythm, S1 and S2 normal Resp: Coarse breath sounds bilaterally-no distinct rales or rhonchi Abd: Soft, obese, nontender Ext: 2+ lower extremity edema  Imaging: No results found.  Labs: BMET  Recent Labs Lab 02/05/14 1339  02/06/14 0500 02/07/14 0452 02/07/14 1445 02/08/14 0502 02/09/14 0400 02/10/14 0825 02/11/14 0618  NA 139 139 137  --  140 138 137 138  K 3.7 3.8 4.2  --  3.7 3.9 3.2* 3.3*  CL 99 99 96  --  95* 91* 91* 93*  CO2 30 31 33*  --  36* 35* 35* 36*  GLUCOSE 224* 133* 169*  --  132* 104* 122* 106*  BUN 32* 36* 37*  --  35* 38* 35* 38*  CREATININE 1.49* 1.60* 1.43*  --  1.33 1.21 1.20 1.39*  CALCIUM 8.5 8.5 8.5  --  8.7 8.8 8.8 8.7  PHOS  --   --   --  3.6  --   --   --   --    CBC  Recent Labs Lab 02/08/14 0502 02/09/14 0400 02/10/14 0825 02/11/14 0618  WBC 7.3 6.1 14.7* 9.6  NEUTROABS 5.1 4.0 12.1* 6.8  HGB 7.9* 8.1* 8.2* 8.2*  HCT 25.3* 25.3* 26.1* 26.4*  MCV 76.7* 76.4* 77.9* 78.3  PLT 297 297 279 270   Medications:    . amLODipine  10 mg Oral Daily  . cloNIDine  0.1 mg Oral TID  . darbepoetin (ARANESP) injection - NON-DIALYSIS  100 mcg Subcutaneous Q Wed-1800  . enoxaparin (LOVENOX) injection  75 mg Subcutaneous Q12H  . FLUoxetine  10 mg Oral Daily  . furosemide  40 mg Oral BID  . hydrALAZINE  50 mg Oral 3 times per day  . insulin aspart  0-15 Units Subcutaneous TID WC  . isosorbide mononitrate  30  mg Oral Daily  . metroNIDAZOLE  500 mg Oral 3 times per day  . potassium chloride  40 mEq Oral Once  . Warfarin - Pharmacist Dosing Inpatient   Does not apply [id number]   Elmarie Shiley, MD 02/11/2014, 9:37 AM

## 2014-02-11 NOTE — Progress Notes (Signed)
CSW has sent discharge summary to [facility] and contacted the admission coordinators to see if patient can return on the weekend. CSW is awaiting return phone call.  Rhea Pink, MSW, [place]

## 2014-02-11 NOTE — Discharge Summary (Signed)
Physician Discharge Summary  Joshua [name] [id number] DOB: 21-Sep-1951 DOA: 01/30/2014  PCP: Duwaine Maxin, DO  Admit date: 01/30/2014 Discharge date: 02/11/2014  Time spent: 35 minutes  Recommendations for Outpatient Follow-up:  1. Need bmet to follow renal function.  2. INR and adjust coumadin as needed.  3. Follow up on resolution of c diff infection.  4. Adjust BP medications as needed.   Discharge Diagnoses:    Cardiac arrest   Acute on chronic diastolic heart failure   C diff colitis.    Nephrotic syndrome   DVT of popliteal vein   Pulmonary tuberculosis   Hypertension   Diabetic neuropathy   Acute respiratory failure   New onset seizure   Diabetic proliferative retinopathy     Suicide ideation   Discharge Condition: Stable.   Diet recommendation: Carb modified, thin consistency.   Filed Weights   02/08/14 0500 02/10/14 0500 02/11/14 0500  Weight: 70.716 kg (155 lb 14.4 oz) 66.134 kg (145 lb 12.8 oz) 65.499 kg (144 lb 6.4 oz)    History of present illness:  [name], [name], [name], [name], 1 amp bicarb, 1 amp calcium chorloride, and 3 amps of epi.  Transferred to [facility] for possible TTM post cardiac arrest.  PMH Recnt discharge 2/27 following left foot ulcer debridement (MRI at this time did not show osteomyelitis, debridement occurred on 2/23, wound culture with staph, placed on oral cephalexin with plans to discontinue 3/11), C. Diff infx, new DVT diagnosis and placed on coumadin (bilateral, dx on 2/13) , AKI, and Acalculous cholecystitis. PMH includes DM II, HTN, previously treated extrapulmonary TB (patient is an immigrant from [place]), HFpEF (EF with grade 2 diastolic  dysfunction, at discharge patient with volume overload), and hx of gi bleed (iron low, low retic). Patient with history of malnutrition as well. Since discharge has been on all his meds, no acute issues until today with distended abdomen, diarrhea, and pain.  Since arrival, patient with elevated blood pressure >200, GCS 10-T following commands, with 34 degrees Celsius temp. No arrhythmias, with EKG without evidence of st changes/cardiac ischemia. Patient with mild reductions in LVEF, IVC not collapsible on exam, otherwise no focal wall motion abnormalities. Trace pericardial effusion. Currently making urine. Intubated at OSH.   Hospital Course:  [name] PMHx DM type 2 with proliferative retinopathy/neuropathy, CKD STAGE III, HTN, diastolic CHF, Hx tuberculosis, Hx seizures, nephrotic syndrome, Hx DVT popliteal vein. 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 Patient was previously admitted to [facility] on [id number] which was complicated by bradycardia/hypotension, and seizure while in the ICU. Patient was intubated 2/13-2/14. Follow have bilateral peroneal DVTs, bilateral pleural effusions (negative for PE), acute acalculous cholecystitis.  Patient admitted on 01/30/2014 Patient undergoing outpatient kub due to diarrhea, abdominal distention (from rehab center) when he had asystole/PEA arrest with ROSC in 14 minutes.   Acute/Chronic diastolic CHF  -hold Norvasc 10 mg.  -hold hydralazine of SBP less than 120.  -Continue clonidine tablet 0.1 mg TID with holder parameters.  -Continue Imdur 30 mg daily  -Lasix changed to 40 mg BID.  -Negative 4000 L.  -weight 77---73---66   C diff colitis;  Patient develop diarrhea 3-18 overnight. C  diff positive.  Started on Flagyl 3-19.  Will change flagyl to oral vancomycin to avoid interaction with coumadin.  Day 2 antibiotics.   Left foot wound;  Does not appears infected. He has small amount of bleeding from wound.  Wound  care consulted for local care.   Nephrotic syndrome  -3/15 spot protein/creatinine ratio= 3.67  -Continue with IV lasix for 24 to 48 hours. Then transition to oral.  -Appreciate Dr Posey Pronto help.  -Mildly increase cr in setting of diuretics and diarrhea. Monitor.   Asystole  -Continue to avoid all AV nodal blocking agent.  -No further therapy per EP, Dr Lovena Le.   Acute respiratory failure  -Patient's continued O2 requirement most likely secondary to diastolic CHF decompensated  -See chronic diastolic CHF   Tachybradycardia syndrome  -See asystole   HTN  -See diastolic CHF  Seizure  -Currently not on seizure medication, patient unsure of any previous medication and cause.  -Patient states he was on seizure medication while hospitalized at one point but has never been on outpatient seizure medication   Diabetes type 2 with retinopathy/neuropathy  -Hemoglobin A1c on 11/26/2013= 6.5; within ADA guidelines  - SSI moderate.   Suicidal ideation  -Per psychiatry No evidence of imminent risk to self or others at present. D/C sitter  -Patient does not meet criteria for psychiatric inpatient admission.  -Per psychiatry start Fluoxetine 10 mg PO QD for depression   DVT popliteal vein  -Continue full dose Lovenox until patient therapeutic on Coumadin  -Continue Coumadin per pharmacy; 3/17 INR= 1.9   Dysphasia?  -3/16 speech conduct bedside swallow; recommends Regular;Thin liquid  Liquid Administration via: Cup;Straw  Medication Administration: Whole meds with liquid  -3/17 continue carb modified diet   Procedures: PCXR 02/06/2014  1. Overall, improved pulmonary aeration compared to 02/02/2014 likely  from an interval decrease in interstitial pulmonary edema.  2. Persistently low inspiratory volumes with small bilateral pleural  effusions and bibasilar atelectasis. Superimposed infiltrate is  difficult to exclude radiographically.  PCXR 02/02/2014  Hypoaeration. Central vascular  congestion and interstitial and hazy  airspace opacities may reflect edema (favored) versus multifocal  infection.  Small effusions and bibasilar opacities, favor atelectasis.  Echocardiogram 01/31/2014  - Left ventricle: The cavity size was normal. Wall thickness was normal. Systolic function was normal. -LVEF= 55% to 60%.Systolic function was mildly reduced. - Aortic valve: Trivial regurgitation. - Left atrium: The atrium was mildly dilated. - Right ventricle: The cavity size was mildly dilated. - Right atrium: The atrium was moderately dilated. - Atrial septum: No defect or patent foramen ovale was identified.  On 01/14/2014  Normal renal size with increased parenchymal echogenicity,  consistent with medical renal disease. No evidence of  hydronephrosis.  Left pleural effusion incidentally noted.   Consultations: Dr.JONNALAGADDA,JANARDHAHA R. (psychiatry)  Dr. Patel(nephrology)    Discharge Exam: Filed Vitals:   02/11/14 0437  BP: 112/61  Pulse: 66  Temp: 98.3 F (36.8 C)  Resp: 20    General: no distress.  Cardiovascular: S 1, S 2 RRR Respiratory: CTA  Discharge Instructions     Medication List    STOP taking these medications       carvedilol 12.5 MG tablet  Commonly known as:  COREG     cephALEXin 500 MG capsule  Commonly known as:  KEFLEX     omeprazole 20 MG capsule  Commonly known as:  PRILOSEC     verapamil 120 MG CR tablet  Commonly known as:  CALAN-SR  TAKE these medications       acetaminophen 325 MG tablet  Commonly known as:  TYLENOL  Take 650 mg by mouth every 4 (four) hours as needed for mild pain, moderate pain or fever (Over 100 degrees.).     cloNIDine 0.1 MG tablet  Commonly known as:  CATAPRES  Take 1 tablet (0.1 mg total) by mouth 3 (three) times daily.     darbepoetin 100 MCG/0.5ML Soln injection  Commonly known as:  ARANESP  Inject 0.5 mLs (100 mcg total) into the skin every Wednesday at 6 PM.     feeding supplement  (PRO-STAT SUGAR FREE 64) Liqd  Take 30 mLs by mouth 2 (two) times daily with a meal.     ferrous sulfate 325 (65 FE) MG tablet  Take 1 tablet (325 mg total) by mouth 3 (three) times daily with meals.     FLUoxetine 10 MG capsule  Commonly known as:  PROZAC  Take 1 capsule (10 mg total) by mouth daily.     furosemide 40 MG tablet  Commonly known as:  LASIX  Take 1 tablet (40 mg total) by mouth 2 (two) times daily.     hydrALAZINE 50 MG tablet  Commonly known as:  APRESOLINE  Take 1 tablet (50 mg total) by mouth every 8 (eight) hours.     insulin aspart 100 UNIT/ML injection  Commonly known as:  novoLOG  Inject 2 Units into the skin every morning. Take 2 units at breakfast and 4 units at lunch and dinner     ipratropium-albuterol 0.5-2.5 (3) MG/3ML Soln  Commonly known as:  DUONEB  Take 3 mLs by nebulization every 4 (four) hours as needed (For SOB and Wheezing).     isosorbide mononitrate 30 MG 24 hr tablet  Commonly known as:  IMDUR  Take 1 tablet (30 mg total) by mouth daily.     vancomycin 50 mg/mL oral solution  Commonly known as:  VANCOCIN  Take 2.5 mLs (125 mg total) by mouth 4 (four) times daily.     warfarin 5 MG tablet  Commonly known as:  COUMADIN  Take 5-7.5 mg by mouth daily. Alternate 5mg  and 7.5mg  every other day       Allergies  Allergen Reactions  . Aspirin Itching      The results of significant diagnostics from this hospitalization (including imaging, microbiology, ancillary and laboratory) are listed below for reference.    Significant Diagnostic Studies: Ct Abdomen Pelvis Wo Contrast  01/30/2014   CLINICAL DATA:  Cardiac arrest.  EXAM: CT CHEST, ABDOMEN AND PELVIS WITHOUT CONTRAST  TECHNIQUE: Multidetector CT imaging of the chest, abdomen and pelvis was performed following the standard protocol without IV contrast.  COMPARISON:  None.  CT CHEST W/O CM dated 01/07/2014; DG CHEST 1 VIEW dated 01/30/2014  FINDINGS: CT CHEST FINDINGS  The endotracheal tube  extends into the right mainstem bronchus. This was immediately called to Dr. Dina Rich on 01/30/2014 at 3 p.m. Nasogastric tube extends into the abdomen. There appears to be prominent mediastinal lymph nodes which are similar to the previous examination but poorly characterized due to the lack of contrast and motion. There are small bilateral pleural effusions, left side greater than right. No significant pericardial fluid. There is diffuse subcutaneous edema. There is compressive atelectasis in lower lobes bilaterally. Patchy densities in left upper lung. Airspace densities within the right middle lobe. There are subtle densities in the right upper lung. No evidence for a large pneumothorax. No acute bone  abnormality.  CT ABDOMEN AND PELVIS FINDINGS  There is no evidence for free air. Nasogastric tube extends into the distal stomach. There is diffuse subcutaneous edema. There is a small amount of abdominal ascites. Unenhanced CT was performed per clinician order. Lack of IV contrast limits sensitivity and specificity, especially for evaluation of abdominal/pelvic solid viscera. Gallbladder may be mildly distended but poorly characterized. No gross abnormality to the liver or spleen. Limited evaluation of the pancreas and adrenal glands. No gross abnormality to the kidneys. There are prominent small bowel loops in the left abdomen with mesenteric edema. Bowel wall thickening, particularly on sequence 2, image 86. Fluid in the urinary bladder. No acute bone abnormality.  IMPRESSION: The study is very limited due to the lack of contrast and motion.  Prominent small bowel loops in the left abdomen with mesenteric edema. There is concern for bowel wall thickening in this area. Based on the history of cardiac arrest, ischemic bowel is in the differential diagnosis. Findings could also be related to an infectious enteritis.  Bilateral small pleural effusions with patchy parenchymal lung densities. Findings could represent  atelectasis and asymmetric edema. Infection cannot excluded, particularly in the right middle lobe.  Endotracheal tube in the right mainstem bronchus as described.  Small amount of abdominal ascites and diffuse subcutaneous edema. Findings are concerning for anasarca.  Mild distention of the gallbladder.   Electronically Signed   By: Markus Daft M.D.   On: 01/30/2014 15:18   Dg Chest 1 View  01/30/2014   CLINICAL DATA:  Status post cardiac arrest ; assess endotracheal tube and nasogastric tube position  EXAM: CHEST - 1 VIEW  COMPARISON:  CT CHEST W/O CM dated 01/30/2014  FINDINGS: The trachea has undergone interval intubation. The tip of the ET tube lies in the proximal right mainstem bronchus. The lung volumes are reasonably well maintained. There are coarse interstitial markings bilaterally with near confluence at the right lung base.  The esophagogastric tube tip and proximal port project below the inferior margin of the film. The cardiac silhouette is mildly enlarged. The pulmonary vascularity is not engorged. External pacemaker pads are present.  IMPRESSION: 1. The endotracheal tube tip is at the margin of the right main stem bronchus and withdrawal by 5 cm is recommended. 2. The esophagogastric tube appears to be in appropriate position. 3. The pulmonary interstitial markings are mildly increased bilaterally especially on the right. Confluent density at the right lung base suggests atelectasis or pneumonia. 4. These results were called by telephone at the time of interpretation on 01/30/2014 at 2:55 PM to Doylene Canard, RN,, who verbally acknowledged these results.   Electronically Signed   By: David  Martinique   On: 01/30/2014 14:56   Dg Chest 2 View  01/26/2014   CLINICAL DATA:  Shortness of breath, lower extremity edema  EXAM: CHEST  2 VIEW  COMPARISON:  01/20/2014  FINDINGS: Cardiomegaly again noted. Bilateral small pleural effusion with bilateral basilar atelectasis or infiltrate. No pulmonary edema. Mild  degenerative changes thoracic spine.  IMPRESSION: Cardiomegaly. Bilateral small pleural effusion with bilateral basilar atelectasis or infiltrate.   Electronically Signed   By: Lahoma Crocker M.D.   On: 01/26/2014 11:35   Dg Chest 2 View  01/20/2014   CLINICAL DATA:  Followup right lung volume loss. Shortness of breath.  EXAM: CHEST  2 VIEW  COMPARISON:  01/08/2014 and 01/07/2014  FINDINGS: Interval removal of nasogastric tube, right subclavian central venous catheter and endotracheal tubes. Lungs are hypoinflated with  continued evidence of a small left pleural effusion likely with associated atelectasis in the left base. There is slightly better aeration in the left base compared to the previous exam. There is slight worsening of a small right pleural effusion likely with associated atelectasis. Stable cardiomegaly. Remainder the exam is unchanged.  IMPRESSION: Mild worsening of a small right pleural effusion likely with associated right basilar atelectasis. Persistent small left pleural effusion without significant change. Persistent opacification in the left base with slightly better aeration which may be due to atelectasis or infection.   Electronically Signed   By: Marin Olp M.D.   On: 01/20/2014 12:18   Dg Abd 1 View  01/30/2014   CLINICAL DATA:  Abdominal tenderness and loose stools.  EXAM: ABDOMEN - 1 VIEW  COMPARISON:  DG ABD PORTABLE 1V dated 01/07/2014  FINDINGS: The bowel gas pattern is normal. No obstruction or significant ileus is visualized. No abnormal calcifications or soft tissue abnormalities are identified. Mild degenerative changes are present of the lower lumbar spine.  IMPRESSION: Unremarkable abdominal radiograph.   Electronically Signed   By: Aletta Edouard M.D.   On: 01/30/2014 14:24   Ct Head Wo Contrast  01/30/2014   CLINICAL DATA:  Status post cardiac arrest, query intracranial hemorrhage.  EXAM: CT HEAD WITHOUT CONTRAST  TECHNIQUE: Contiguous axial images were obtained from the  base of the skull through the vertex without intravenous contrast.  COMPARISON:  CT HEAD W/O CM dated 01/07/2014  FINDINGS: The brainstem, cerebellum, cerebral peduncles, thalamus, basal ganglia, basilar cisterns, and ventricular system appear within normal limits. Periventricular white matter and corona radiata hypodensities favor chronic ischemic microvascular white matter disease. No intracranial hemorrhage, mass lesion, or acute CVA. Mucosal thickening in the nasal cavity noted. The mild chronic maxillary, ethmoid, and left sphenoid sinusitis.  IMPRESSION: 1. No intracranial hemorrhage, findings of acute CVA, or other acute intracranial abnormality. 2. Chronic paranasal sinusitis with mucosal swelling and frothy fluid in the nasal cavity.   Electronically Signed   By: Sherryl Barters M.D.   On: 01/30/2014 16:04   Ct Chest Wo Contrast  01/30/2014   CLINICAL DATA:  Cardiac arrest.  EXAM: CT CHEST, ABDOMEN AND PELVIS WITHOUT CONTRAST  TECHNIQUE: Multidetector CT imaging of the chest, abdomen and pelvis was performed following the standard protocol without IV contrast.  COMPARISON:  None.  CT CHEST W/O CM dated 01/07/2014; DG CHEST 1 VIEW dated 01/30/2014  FINDINGS: CT CHEST FINDINGS  The endotracheal tube extends into the right mainstem bronchus. This was immediately called to Dr. Dina Rich on 01/30/2014 at 3 p.m. Nasogastric tube extends into the abdomen. There appears to be prominent mediastinal lymph nodes which are similar to the previous examination but poorly characterized due to the lack of contrast and motion. There are small bilateral pleural effusions, left side greater than right. No significant pericardial fluid. There is diffuse subcutaneous edema. There is compressive atelectasis in lower lobes bilaterally. Patchy densities in left upper lung. Airspace densities within the right middle lobe. There are subtle densities in the right upper lung. No evidence for a large pneumothorax. No acute bone  abnormality.  CT ABDOMEN AND PELVIS FINDINGS  There is no evidence for free air. Nasogastric tube extends into the distal stomach. There is diffuse subcutaneous edema. There is a small amount of abdominal ascites. Unenhanced CT was performed per clinician order. Lack of IV contrast limits sensitivity and specificity, especially for evaluation of abdominal/pelvic solid viscera. Gallbladder may be mildly distended but poorly characterized. No  gross abnormality to the liver or spleen. Limited evaluation of the pancreas and adrenal glands. No gross abnormality to the kidneys. There are prominent small bowel loops in the left abdomen with mesenteric edema. Bowel wall thickening, particularly on sequence 2, image 86. Fluid in the urinary bladder. No acute bone abnormality.  IMPRESSION: The study is very limited due to the lack of contrast and motion.  Prominent small bowel loops in the left abdomen with mesenteric edema. There is concern for bowel wall thickening in this area. Based on the history of cardiac arrest, ischemic bowel is in the differential diagnosis. Findings could also be related to an infectious enteritis.  Bilateral small pleural effusions with patchy parenchymal lung densities. Findings could represent atelectasis and asymmetric edema. Infection cannot excluded, particularly in the right middle lobe.  Endotracheal tube in the right mainstem bronchus as described.  Small amount of abdominal ascites and diffuse subcutaneous edema. Findings are concerning for anasarca.  Mild distention of the gallbladder.   Electronically Signed   By: Markus Daft M.D.   On: 01/30/2014 15:18   Nm Hepatobiliary  01/12/2014   CLINICAL DATA:  Upper abdominal pain  EXAM: NUCLEAR MEDICINE HEPATOBILIARY IMAGING  Views:  Anterior right upper quadrant  Radionuclide:  Technetium 34m Choletec  Dose:  5.0 mCi  Route of administration: Intravenous  COMPARISON:  None.  FINDINGS: Liver uptake of radiotracer is normal. There is prompt  visualization of gallbladder and small bowel, indicating patency of the cystic and common bile ducts.  IMPRESSION: Study within normal limits.   Electronically Signed   By: Lowella Grip M.D.   On: 01/12/2014 13:14   US Renal  01/14/2014   CLINICAL DATA:  Acute kidney injury.  Renal insufficiency.  EXAM: RENAL/URINARY TRACT ULTRASOUND COMPLETE  COMPARISON:  None.  FINDINGS: Right Kidney:  Length: 11.6 cm. Diffusely increased parenchymal echogenicity seen, consistent with medical renal disease. No mass or hydronephrosis visualized.  Left Kidney:  Length: 11.9 cm. Diffusely increased parenchymal echogenicity seen, consistent with medical renal disease. No mass or hydronephrosis visualized. Left pleural effusion also noted.  Bladder:  Empty with Foley catheter in place.  IMPRESSION: Normal renal size with increased parenchymal echogenicity, consistent with medical renal disease. No evidence of hydronephrosis.  Left pleural effusion incidentally noted.   Electronically Signed   By: Earle Gell M.D.   On: 01/14/2014 21:09   Nm Pulmonary Perf And Vent  02/01/2014   CLINICAL DATA Shortness of breath, evaluate for pulmonary embolism  EXAM NUCLEAR MEDICINE VENTILATION - PERFUSION LUNG SCAN  TECHNIQUE Ventilation images were obtained in multiple projections using inhaled aerosol technetium 99 M DTPA. Perfusion images were obtained in multiple projections after intravenous injection of Tc-28m MAA.  RADIOPHARMACEUTICALS 40 mCi Tc-37m DTPA aerosol and 6 mCi Tc-70m MAA  COMPARISON NM PULMONARY VENT & PERF dated 01/09/2014; DG CHEST 1V PORT dated 01/30/2014; DG CHEST 1 VIEW dated 01/30/2014; DG CHEST 2 VIEW dated 01/26/2014  FINDINGS Review of chest radiograph performed 01/30/2014 demonstrates an enlarged cardiac silhouette and mediastinal contours. Stable position of support apparatus. No pneumothorax. External pacing pads overlie left ventricular apex. The pulmonary vasculature is indistinct with cephalization of flow.  Grossly unchanged perihilar and bilateral medial basilar opacities. Unchanged small bilateral effusions. No definite pneumothorax.  Ventilation: Review the ventilatory images demonstrates clumping of inhaled radiotracer about the bilateral pulmonary hila, left greater than right. Inhaled radiotracer is seen layering within the left mainstem bronchus. There is overall poor ventilation of the bilateral lungs. A minimal  amount of radiotracer is seen within the hypopharynx.  Perfusion: There is relative homogeneous distribution of injected radiotracer throughout the bilateral pulmonary parenchyma. There is minimal oligemia about the right major fissure and blunting of the left costophrenic angle, both of which are unchanged and likely secondary to the patient's known small bilateral pleural effusions. No discrete mismatched segmental or subsegmental filling defects.  IMPRESSION Pulmonary embolism absent (very low probability for pulmonary embolism).  SIGNATURE  Electronically Signed   By: Sandi Mariscal M.D.   On: 02/01/2014 16:09   US Abdomen Port  01/31/2014   CLINICAL DATA:  Elevated liver function tests.  EXAM: ULTRASOUND PORTABLE ABDOMEN  COMPARISON:  CT of the abdomen and pelvis 01/30/2014. Abdominal ultrasound 01/08/2014.  FINDINGS: Gallbladder:  No gallstones. Gallbladder is only partially distended. Gallbladder wall thickness is slightly increased at 4.4 mm (likely a reflection of under distention). No pericholecystic fluid. Per report from the sonographer, the patient did not exhibit a sonographic Murphy's sign on examination.  Common bile duct:  Diameter: Normal in caliber measuring 5.6 mm in the porta hepatis.  Liver:  No focal lesion identified. Within normal limits in parenchymal echogenicity. No intrahepatic biliary ductal dilatation.  IVC:  No abnormality visualized.  Pancreas:  Visualized portion unremarkable.  Spleen:  Size and appearance within normal limits. Measures 5.5 cm in length.  Right Kidney:   Length: 12.5 cm. Echogenicity within normal limits. No mass or hydronephrosis visualized.  Left Kidney:  Length: 12.4 cm. Echogenicity within normal limits. No mass or hydronephrosis visualized.  Abdominal aorta:  Largely obscured by overlying bowel gas. Measures up to 2.5 cm in diameter proximally.  Other findings:  Small volume of ascites.  Bilateral pleural effusions.  IMPRESSION: 1. No definite acute findings to account for the patient's symptoms. 2. Although the gallbladder wall appears mildly thickened at 4.4 mm, this is nonspecific and likely related to under distention of the gallbladder on today's examination, as well as a generalized state of anasarca given the presence of ascites and pleural effusions. No definite findings to suggest acute cholecystitis are noted at this time. Additionally, there is no intra or extrahepatic biliary ductal dilatation.   Electronically Signed   By: Vinnie Langton M.D.   On: 01/31/2014 07:18   Dg Chest Port 1 View  02/06/2014   CLINICAL DATA:  Increasing oxygen requirement  EXAM: PORTABLE CHEST - 1 VIEW  COMPARISON:  Prior chest x-ray 02/02/2014  FINDINGS: Persistently low inspiratory volumes with bibasilar atelectasis. Overall, the pulmonary aeration has improved compared to the prior radiograph likely secondary to a decrease in interstitial edema. There are persistent left slightly larger than right small layering pleural effusions. The cardiac and mediastinal contours are unchanged. No pneumothorax. No acute osseous abnormality.  IMPRESSION: 1. Overall, improved pulmonary aeration compared to 02/02/2014 likely from an interval decrease in interstitial pulmonary edema. 2. Persistently low inspiratory volumes with small bilateral pleural effusions and bibasilar atelectasis. Superimposed infiltrate is difficult to exclude radiographically.   Electronically Signed   By: Jacqulynn Cadet M.D.   On: 02/06/2014 09:17   Dg Chest Port 1 View  02/02/2014   CLINICAL DATA  Dyspnea  EXAM PORTABLE CHEST - 1 VIEW  COMPARISON 01/30/2014  FINDINGS Cardiomediastinal contours are mildly prominent. Central vascular congestion. Interstitial prominence and patchy bibasilar opacities. Small effusions not excluded. No pneumothorax. Multilevel degenerative change. Multiple wires/tubes are presumably external.  IMPRESSION Hypoaeration. Central vascular congestion and interstitial and hazy airspace opacities may reflect edema (favored) versus multifocal infection.  Small effusions and bibasilar opacities, favor atelectasis.  SIGNATURE  Electronically Signed   By: Carlos Levering M.D.   On: 02/02/2014 22:59   Dg Chest Port 1 View  01/30/2014   CLINICAL DATA:  Central line placement.  EXAM: PORTABLE CHEST - 1 VIEW  COMPARISON:  01/30/2014 1431 hr  FINDINGS: Right internal jugular central venous line has its tip in the lower superior vena cava. There is no pneumothorax.  Endotracheal tube has been retracted. Tip now lies 3.7 cm above the chronic, well positioned.  Nasogastric tube is stable passing well into the stomach. Lung base opacity is also unchanged.  IMPRESSION: 1. Right internal jugular central venous line tip in the lower superior vena cava. No pneumothorax. 2. Endotracheal tube well positioned.   Electronically Signed   By: Lajean Manes M.D.   On: 01/30/2014 21:01    Microbiology: Recent Results (from the past 240 hour(s))  CLOSTRIDIUM DIFFICILE BY PCR     Status: Abnormal   Collection Time    02/10/14  9:39 AM      Result Value Ref Range Status   C difficile by pcr POSITIVE (*) NEGATIVE Final   Comment: CRITICAL RESULT CALLED TO, READ BACK BY AND VERIFIED WITH:     Bayard Beaver RN 11:35 02/10/14 (wilsonm)     Labs: Basic Metabolic Panel:  Recent Labs Lab 02/07/14 0452 02/07/14 1445 02/08/14 0502 02/09/14 0400 02/10/14 0825 02/11/14 0618  NA 137  --  140 138 137 138  K 4.2  --  3.7 3.9 3.2* 3.3*  CL 96  --  95* 91* 91* 93*  CO2 33*  --  36* 35* 35* 36*  GLUCOSE  169*  --  132* 104* 122* 106*  BUN 37*  --  35* 38* 35* 38*  CREATININE 1.43*  --  1.33 1.21 1.20 1.39*  CALCIUM 8.5  --  8.7 8.8 8.8 8.7  MG 1.7  --  1.6 1.6 1.6 1.8  PHOS  --  3.6  --   --   --   --    Liver Function Tests:  Recent Labs Lab 02/06/14 0500 02/07/14 0452 02/08/14 0502 02/09/14 0400 02/10/14 0825  AST 18 32 22 27 23   ALT 24 29 25 27 23   ALKPHOS 287* 319* 292* 291* 269*  BILITOT 0.3 0.3 0.3 0.3 0.3  PROT 6.4 6.5 6.5 6.7 6.6  ALBUMIN 1.9* 1.9* 1.9* 2.0* 2.0*   No results found for this basename: LIPASE, AMYLASE,  in the last 168 hours No results found for this basename: AMMONIA,  in the last 168 hours CBC:  Recent Labs Lab 02/07/14 0452 02/08/14 0502 02/09/14 0400 02/10/14 0825 02/11/14 0618  WBC 7.8 7.3 6.1 14.7* 9.6  NEUTROABS 5.7 5.1 4.0 12.1* 6.8  HGB 8.1* 7.9* 8.1* 8.2* 8.2*  HCT 26.0* 25.3* 25.3* 26.1* 26.4*  MCV 77.4* 76.7* 76.4* 77.9* 78.3  PLT 303 297 297 279 270   Cardiac Enzymes: No results found for this basename: CKTOTAL, CKMB, CKMBINDEX, TROPONINI,  in the last 168 hours BNP: BNP (last 3 results)  Recent Labs  01/30/14 2028  PROBNP 9165.0*   CBG:  Recent Labs Lab 02/10/14 0736 02/10/14 1131 02/10/14 1630 02/10/14 2053 02/11/14 0757  GLUCAP 135* 135* 203* 222* 125*       Signed:  Huntleigh Doolen A  [facility] 02/11/2014, 11:03 AM

## 2014-02-11 NOTE — Progress Notes (Signed)
[facility] PROGRESS NOTE  HUBBERT DEGRAW [id number] DOB: 02-17-1951 DOA: 01/30/2014 PCP: Duwaine Maxin, DO  Assessment/Plan: [name] PMHx  DM type 2 with proliferative retinopathy/neuropathy, CKD STAGE III, HTN, diastolic CHF, Hx tuberculosis, Hx seizures, nephrotic syndrome, Hx DVT popliteal vein. 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 Patient was previously admitted to [facility] on [id number] which was complicated by bradycardia/hypotension, and seizure while in the ICU. Patient was intubated 2/13-2/14. Follow have bilateral peroneal DVTs, bilateral pleural effusions (negative for PE), acute acalculous cholecystitis. Patient admitted on 01/30/2014 Patient undergoing outpatient kub due to diarrhea, abdominal distention (from rehab center) when he had asystole/PEA arrest with ROSC in 14 minutes.  Acute/Chronic diastolic CHF -hold  Norvasc 10 mg, hydralazine due to soft SBP in setting of diarrhea.  -Continue clonidine tablet 0.1 mg TID with holder parameters.  -Continue Imdur 30 mg daily -Lasix changed to 40 mg BID.  -Negative 4000 L.  -weight 77---73---66  C diff colitis;  Patient develop diarrhea 3-18 overnight. C diff positive.  Started on Flagyl 3-19.  Will change flagyl to oral vancomycin to avoid interaction with coumadin.  Day 2 antibiotics.   Left foot wound;  Does not appears infected. He has small amount of bleeding from wound.  Wound care consulted for local care.   Nephrotic syndrome -3/15 spot protein/creatinine ratio= 3.67 -Continue with IV lasix for 24 to 48 hours. Then transition to oral.  -Appreciate Dr Posey Pronto help.   Asystole -Continue to avoid all AV nodal blocking agent. -No further therapy per EP, Dr Lovena Le.   Acute respiratory failure -Patient's continued O2 requirement most likely secondary to diastolic CHF decompensated -See chronic diastolic CHF  Tachybradycardia syndrome -See asystole  HTN -See  diastolic CHF  Seizure -Currently not on seizure medication, patient unsure of any previous medication and cause. -Patient states he was on seizure medication while hospitalized at one point but has never been on outpatient seizure medication  Diabetes type 2 with retinopathy/neuropathy -Hemoglobin A1c on 11/26/2013= 6.5; within ADA guidelines - SSI  moderate.   Suicidal ideation -Per psychiatry No evidence of imminent risk to self or others at present.  D/C sitter  -Patient does not meet criteria for psychiatric inpatient admission.  -Per psychiatry start Fluoxetine 10 mg PO QD for depression  DVT popliteal vein -Continue full dose Lovenox until patient therapeutic on Coumadin  -Continue Coumadin per pharmacy; 3/17 INR= 1.53  Dysphasia? -3/16 speech conduct bedside swallow; recommends Regular;Thin liquid  Liquid Administration via: Cup;Straw  Medication Administration: Whole meds with liquid -3/17 continue carb modified diet   Code Status: Full Family Communication: None available Disposition Plan: SNF when on oral lasix.     Consultants: Dr.JONNALAGADDA,JANARDHAHA R. (psychiatry) Dr. Patel(nephrology)     Procedures: PCXR 02/06/2014 1. Overall, improved pulmonary aeration compared to 02/02/2014 likely  from an interval decrease in interstitial pulmonary edema.  2. Persistently low inspiratory volumes with small bilateral pleural  effusions and bibasilar atelectasis. Superimposed infiltrate is  difficult to exclude radiographically.  PCXR 02/02/2014 Hypoaeration. Central vascular congestion and interstitial and hazy  airspace opacities may reflect edema (favored) versus multifocal  infection.  Small effusions and bibasilar opacities, favor atelectasis.   Echocardiogram 01/31/2014 - Left ventricle: The cavity size was normal. Wall thickness was normal. Systolic function was normal. -LVEF= 55% to 60%.Systolic function was mildly reduced. - Aortic valve: Trivial  regurgitation. - Left atrium: The atrium was mildly dilated. - Right ventricle: The cavity size was  mildly dilated. - Right atrium: The atrium was moderately dilated. - Atrial septum: No defect or patent foramen ovale was identified.  On 01/14/2014  Normal renal size with increased parenchymal echogenicity,  consistent with medical renal disease. No evidence of  hydronephrosis.  Left pleural effusion incidentally noted.    Antibiotics:    HPI/Subjective: Doing better, less frequents bowel movement.    Objective: Filed Vitals:   02/10/14 0610 02/10/14 2054 02/11/14 0437 02/11/14 0500  BP: 130/65 103/54 112/61   Pulse: 80 72 66   Temp: 98.2 F (36.8 C) 97.8 F (36.6 C) 98.3 F (36.8 C)   TempSrc: Oral Oral Oral   Resp: 18 20 20    Height:      Weight:    65.499 kg (144 lb 6.4 oz)  SpO2: 99% 97% 100%     Intake/Output Summary (Last 24 hours) at 02/11/14 1049 Last data filed at 02/11/14 0440  Gross per 24 hour  Intake      0 ml  Output    700 ml  Net   -700 ml   Filed Weights   02/08/14 0500 02/10/14 0500 02/11/14 0500  Weight: 70.716 kg (155 lb 14.4 oz) 66.134 kg (145 lb 12.8 oz) 65.499 kg (144 lb 6.4 oz)    Exam:   General: NAD  Cardiovascular: Rhythm and rate, negative murmurs rubs or gallops  Respiratory: Clear to auscultation bilateral   Abdomen: NT, mild distended, no rigidity.   Musculoskeletal: plus 2 edema.   Data Reviewed: Basic Metabolic Panel:  Recent Labs Lab 02/07/14 0452 02/07/14 1445 02/08/14 0502 02/09/14 0400 02/10/14 0825 02/11/14 0618  NA 137  --  140 138 137 138  K 4.2  --  3.7 3.9 3.2* 3.3*  CL 96  --  95* 91* 91* 93*  CO2 33*  --  36* 35* 35* 36*  GLUCOSE 169*  --  132* 104* 122* 106*  BUN 37*  --  35* 38* 35* 38*  CREATININE 1.43*  --  1.33 1.21 1.20 1.39*  CALCIUM 8.5  --  8.7 8.8 8.8 8.7  MG 1.7  --  1.6 1.6 1.6 1.8  PHOS  --  3.6  --   --   --   --    Liver Function Tests:  Recent Labs Lab 02/06/14 0500  02/07/14 0452 02/08/14 0502 02/09/14 0400 02/10/14 0825  AST 18 32 22 27 23   ALT 24 29 25 27 23   ALKPHOS 287* 319* 292* 291* 269*  BILITOT 0.3 0.3 0.3 0.3 0.3  PROT 6.4 6.5 6.5 6.7 6.6  ALBUMIN 1.9* 1.9* 1.9* 2.0* 2.0*   No results found for this basename: LIPASE, AMYLASE,  in the last 168 hours No results found for this basename: AMMONIA,  in the last 168 hours CBC:  Recent Labs Lab 02/07/14 0452 02/08/14 0502 02/09/14 0400 02/10/14 0825 02/11/14 0618  WBC 7.8 7.3 6.1 14.7* 9.6  NEUTROABS 5.7 5.1 4.0 12.1* 6.8  HGB 8.1* 7.9* 8.1* 8.2* 8.2*  HCT 26.0* 25.3* 25.3* 26.1* 26.4*  MCV 77.4* 76.7* 76.4* 77.9* 78.3  PLT 303 297 297 279 270   Cardiac Enzymes: No results found for this basename: CKTOTAL, CKMB, CKMBINDEX, TROPONINI,  in the last 168 hours BNP (last 3 results)  Recent Labs  01/30/14 2028  PROBNP 9165.0*   CBG:  Recent Labs Lab 02/10/14 0736 02/10/14 1131 02/10/14 1630 02/10/14 2053 02/11/14 0757  GLUCAP 135* 135* 203* 222* 125*    Recent Results (from the past 240  hour(s))  CLOSTRIDIUM DIFFICILE BY PCR     Status: Abnormal   Collection Time    02/10/14  9:39 AM      Result Value Ref Range Status   C difficile by pcr POSITIVE (*) NEGATIVE Final   Comment: CRITICAL RESULT CALLED TO, READ BACK BY AND VERIFIED WITH:     Bayard Beaver RN 11:35 02/10/14 ([email or web address])     Studies: No results found.  Scheduled Meds: . cloNIDine  0.1 mg Oral TID  . darbepoetin (ARANESP) injection - NON-DIALYSIS  100 mcg Subcutaneous Q Wed-1800  . enoxaparin (LOVENOX) injection  65 mg Subcutaneous Q12H  . FLUoxetine  10 mg Oral Daily  . furosemide  40 mg Oral BID  . insulin aspart  0-15 Units Subcutaneous TID WC  . isosorbide mononitrate  30 mg Oral Daily  . potassium chloride  40 mEq Oral Once  . warfarin  5 mg Oral ONCE-1800  . Warfarin - Pharmacist Dosing Inpatient   Does not apply q1800   Continuous Infusions: . sodium chloride Stopped (02/03/14 2256)    Active  Problems:   Pulmonary tuberculosis   Hypertension   Diabetic neuropathy   Acute respiratory failure   New onset seizure   Diabetic proliferative retinopathy   DVT of popliteal vein   Acute on chronic diastolic heart failure   Nephrotic syndrome   Cardiac arrest   Suicide ideation    Time spent:25 minute    [place], [facility] Pager [phone]. If 7PM-7AM, please contact night-coverage at [email or web address], password [facility] 02/11/2014, 10:49 AM  LOS: 12 days

## 2014-02-11 NOTE — Consult Note (Addendum)
WOC wound consult note Reason for Consult: Refer to initial consult note from 3/10.  Re-consult requested for left foot wounds. Wound type: Full thickness wounds to anterior and plantar foot; pt had previous debridement performed by Dr Doran Durand of the ortho service. Measurement: Plantar foot 3.5X.3X.2cm, dry red wound bed, small amt pink drainage, no odor Anterior foot 3.5X1X1.5 cm, moist red wound bed, mod amt red blood-tinged drainage, no odor. Dressing procedure/placement/frequency: Continue present plan of care with moist gauze dressings.  Pt can resume follow-up with ortho service after discharge. Please re-consult if further assistance is needed.  Gae Dry MSN, RN, [place], [place], [place] .

## 2014-02-11 NOTE — Progress Notes (Signed)
ANTICOAGULATION CONSULT NOTE - Follow Up Consult  Pharmacy Consult for Lovenox and Coumadin Indication: Recent bilateral DVT   Allergies  Allergen Reactions  . Aspirin Itching    Patient Measurements: Height: 5\' 6"  (167.6 cm) Weight: 144 lb 6.4 oz (65.499 kg) IBW/kg (Calculated) : 63.8   Vital Signs: Temp: 98.3 F (36.8 C) (03/20 0437) Temp src: Oral (03/20 0437) BP: 112/61 mmHg (03/20 0437) Pulse Rate: 66 (03/20 0437)  Labs:  Recent Labs  02/09/14 0400 02/10/14 0825 02/11/14 0618  HGB 8.1* 8.2* 8.2*  HCT 25.3* 26.1* 26.4*  PLT 297 279 270  LABPROT 18.0* 20.2* 21.8*  INR 1.53* 1.78* 1.97*  CREATININE 1.21 1.20 1.39*    Estimated Creatinine Clearance: 49.7 ml/min (by C-G formula based on Cr of 1.39).  Assessment: 63 yo male admitted on 3/8 after cardiac arrest. Patient noted with recent DVT  (01/07/14) on coumadin and lovenox. Of note admit INR was 4.68 and s/p vitamin K on 3/9 and 3/10. Patient now noted with c. Diff on flagyl. INR today is 1.97 with trend up. Also patient wt is down to 65kg.   PTA Coumadin regimen:  5mg  alternating with 7.5mg   Goal: INR 2-3  Plan:  -Coumadin 5mg  po (anticipate further INR trend up and as well as flagyl interaction) -Change lovenox to 65mg  [place] q12h -Consider d/c falgyl and start vancomycin po?  Hildred Laser, Pharm D 02/11/2014 9:52 AM

## 2014-02-12 LAB — CBC WITH DIFFERENTIAL/PLATELET
BASOS ABS: 0 10*3/uL (ref 0.0–0.1)
Basophils Relative: 0 % (ref 0–1)
Eosinophils Absolute: 0.3 10*3/uL (ref 0.0–0.7)
Eosinophils Relative: 4 % (ref 0–5)
HCT: 25 % — ABNORMAL LOW (ref 39.0–52.0)
Hemoglobin: 7.8 g/dL — ABNORMAL LOW (ref 13.0–17.0)
LYMPHS ABS: 1.5 10*3/uL (ref 0.7–4.0)
LYMPHS PCT: 17 % (ref 12–46)
MCH: 24.8 pg — AB (ref 26.0–34.0)
MCHC: 31.2 g/dL (ref 30.0–36.0)
MCV: 79.6 fL (ref 78.0–100.0)
MONO ABS: 0.9 10*3/uL (ref 0.1–1.0)
Monocytes Relative: 11 % (ref 3–12)
Neutro Abs: 6.1 10*3/uL (ref 1.7–7.7)
Neutrophils Relative %: 68 % (ref 43–77)
Platelets: 276 10*3/uL (ref 150–400)
RBC: 3.14 MIL/uL — ABNORMAL LOW (ref 4.22–5.81)
RDW: 20.4 % — AB (ref 11.5–15.5)
WBC: 8.9 10*3/uL (ref 4.0–10.5)

## 2014-02-12 LAB — BASIC METABOLIC PANEL
BUN: 41 mg/dL — AB (ref 6–23)
CO2: 34 meq/L — AB (ref 19–32)
CREATININE: 1.43 mg/dL — AB (ref 0.50–1.35)
Calcium: 8.4 mg/dL (ref 8.4–10.5)
Chloride: 94 mEq/L — ABNORMAL LOW (ref 96–112)
GFR calc Af Amer: 59 mL/min — ABNORMAL LOW (ref >90)
GFR calc non Af Amer: 51 mL/min — ABNORMAL LOW (ref >90)
Glucose, Bld: 126 mg/dL — ABNORMAL HIGH (ref 70–99)
Potassium: 3.9 mEq/L (ref 3.7–5.3)
Sodium: 137 mEq/L (ref 137–147)

## 2014-02-12 LAB — GLUCOSE, CAPILLARY
GLUCOSE-CAPILLARY: 148 mg/dL — AB (ref 70–99)
GLUCOSE-CAPILLARY: 181 mg/dL — AB (ref 70–99)
GLUCOSE-CAPILLARY: 171 mg/dL — AB (ref 70–99)
Glucose-Capillary: 157 mg/dL — ABNORMAL HIGH (ref 70–99)

## 2014-02-12 LAB — PROTIME-INR
INR: 2.27 — ABNORMAL HIGH (ref 0.00–1.49)
Prothrombin Time: 24.3 seconds — ABNORMAL HIGH (ref 11.6–15.2)

## 2014-02-12 LAB — MAGNESIUM: Magnesium: 1.8 mg/dL (ref 1.5–2.5)

## 2014-02-12 MED ORDER — WARFARIN SODIUM 5 MG PO TABS
5.0000 mg | ORAL_TABLET | Freq: Once | ORAL | Status: AC
  Administered 2014-02-12: 5 mg via ORAL
  Filled 2014-02-12: qty 1

## 2014-02-12 NOTE — Progress Notes (Signed)
Clinical Education officer, museum (CSW) contacted [facility] to see if they could accept patient over the weekend. Juliann Pulse Production assistant, radio at [employer] reported that she contacted the Education officer, museum who reported that the patient is not approved to come over the weekend and there is no bed.   Blima Rich, [place] Weekend CSW [phone]

## 2014-02-12 NOTE — Progress Notes (Signed)
[facility] PROGRESS NOTE  Joshua [name] [id number] DOB: 01-20-1951 DOA: 01/30/2014 PCP: Duwaine Maxin, DO  Assessment/Plan: [name] PMHx  DM type 2 with proliferative retinopathy/neuropathy, CKD STAGE III, HTN, diastolic CHF, Hx tuberculosis, Hx seizures, nephrotic syndrome, Hx DVT popliteal vein. 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 Patient was previously admitted to [facility] on [id number] which was complicated by bradycardia/hypotension, and seizure while in the ICU. Patient was intubated 2/13-2/14. Follow have bilateral peroneal DVTs, bilateral pleural effusions (negative for PE), acute acalculous cholecystitis. Patient admitted on 01/30/2014 Patient undergoing outpatient kub due to diarrhea, abdominal distention (from rehab center) when he had asystole/PEA arrest with ROSC in 14 minutes.  Acute/Chronic diastolic CHF -hold  Norvasc 10 mg, hydralazine due to soft SBP in setting of diarrhea.  -Continue clonidine tablet 0.1 mg TID with holder parameters.  -Continue Imdur 30 mg daily -Lasix  40 mg BID.  -Negative 21 L. -weight 77---73---66--65  Nephrotic syndrome -3/15 spot protein/creatinine ratio= 3.67 -Continue with oral lasix.  -Appreciate Dr Posey Pronto help.  -Mildly increase Cr today at 1.43. Monitor on lasix.   Anemia; repeat in am.   C diff colitis;  Patient develop diarrhea 3-18 overnight. C diff positive.  Started on Flagyl 3-19.  Will change flagyl to oral vancomycin to avoid interaction with coumadin.  Day 3 antibiotics.  Diarrhea improving.   Left foot wound;  Does not appears infected. He has small amount of bleeding from wound.  Wound care consulted for local care.    Asystole -Continue to avoid all AV nodal blocking agent. -No further therapy per EP, Dr Lovena Le.   Acute respiratory failure -Patient's continued O2 requirement most likely secondary to diastolic CHF decompensated -See chronic diastolic  CHF  Tachybradycardia syndrome -See asystole  HTN -See diastolic CHF  Seizure -Currently not on seizure medication, patient unsure of any previous medication and cause. -Patient states he was on seizure medication while hospitalized at one point but has never been on outpatient seizure medication  Diabetes type 2 with retinopathy/neuropathy -Hemoglobin A1c on 11/26/2013= 6.5; within ADA guidelines - SSI  moderate.   Suicidal ideation -Per psychiatry No evidence of imminent risk to self or others at present.  D/C sitter  -Patient does not meet criteria for psychiatric inpatient admission.  -Per psychiatry start Fluoxetine 10 mg PO QD for depression  DVT popliteal vein -Continue Coumadin per pharmacy; 3/17 INR= 2.2 -Will discontinue Lovenox.   Dysphasia? -3/16 speech conduct bedside swallow; recommends Regular;Thin liquid  Liquid Administration via: Cup;Straw  Medication Administration: Whole meds with liquid -3/17 continue carb modified diet   Code Status: Full Family Communication: None available Disposition Plan: SNF when on oral lasix.     Consultants: Dr.JONNALAGADDA,JANARDHAHA R. (psychiatry) Dr. Patel(nephrology)     Procedures: PCXR 02/06/2014 1. Overall, improved pulmonary aeration compared to 02/02/2014 likely  from an interval decrease in interstitial pulmonary edema.  2. Persistently low inspiratory volumes with small bilateral pleural  effusions and bibasilar atelectasis. Superimposed infiltrate is  difficult to exclude radiographically.  PCXR 02/02/2014 Hypoaeration. Central vascular congestion and interstitial and hazy  airspace opacities may reflect edema (favored) versus multifocal  infection.  Small effusions and bibasilar opacities, favor atelectasis.   Echocardiogram 01/31/2014 - Left ventricle: The cavity size was normal. Wall thickness was normal. Systolic function was normal. -LVEF= 55% to 60%.Systolic function was mildly reduced. - Aortic  valve: Trivial regurgitation. - Left atrium: The atrium was mildly dilated. - Right ventricle: The  cavity size was mildly dilated. - Right atrium: The atrium was moderately dilated. - Atrial septum: No defect or patent foramen ovale was identified.  On 01/14/2014  Normal renal size with increased parenchymal echogenicity,  consistent with medical renal disease. No evidence of  hydronephrosis.  Left pleural effusion incidentally noted.    Antibiotics:    HPI/Subjective: Doing better, less frequents bowel movement.    Objective: Filed Vitals:   02/11/14 1809 02/11/14 2042 02/12/14 0446 02/12/14 1324  BP: 106/59 116/59 123/66 120/59  Pulse:  72 74 71  Temp:  98.7 F (37.1 C) 98.2 F (36.8 C)   TempSrc:  Oral Oral Oral  Resp:  18 18 18   Height:      Weight:   65.877 kg (145 lb 3.7 oz)   SpO2:  100% 99% 100%    Intake/Output Summary (Last 24 hours) at 02/12/14 1514 Last data filed at 02/12/14 0649  Gross per 24 hour  Intake    372 ml  Output   1150 ml  Net   -778 ml   Filed Weights   02/10/14 0500 02/11/14 0500 02/12/14 0446  Weight: 66.134 kg (145 lb 12.8 oz) 65.499 kg (144 lb 6.4 oz) 65.877 kg (145 lb 3.7 oz)    Exam:   General: NAD  Cardiovascular: Rhythm and rate, negative murmurs rubs or gallops  Respiratory: Clear to auscultation bilateral   Abdomen: NT, mild distended, no rigidity.   Musculoskeletal: plus 2 edema.   Data Reviewed: Basic Metabolic Panel:  Recent Labs Lab 02/07/14 1445 02/08/14 0502 02/09/14 0400 02/10/14 0825 02/11/14 0618 02/12/14 0322  NA  --  140 138 137 138 137  K  --  3.7 3.9 3.2* 3.3* 3.9  CL  --  95* 91* 91* 93* 94*  CO2  --  36* 35* 35* 36* 34*  GLUCOSE  --  132* 104* 122* 106* 126*  BUN  --  35* 38* 35* 38* 41*  CREATININE  --  1.33 1.21 1.20 1.39* 1.43*  CALCIUM  --  8.7 8.8 8.8 8.7 8.4  MG  --  1.6 1.6 1.6 1.8 1.8  PHOS 3.6  --   --   --   --   --    Liver Function Tests:  Recent Labs Lab 02/06/14 0500  02/07/14 0452 02/08/14 0502 02/09/14 0400 02/10/14 0825  AST 18 32 22 27 23   ALT 24 29 25 27 23   ALKPHOS 287* 319* 292* 291* 269*  BILITOT 0.3 0.3 0.3 0.3 0.3  PROT 6.4 6.5 6.5 6.7 6.6  ALBUMIN 1.9* 1.9* 1.9* 2.0* 2.0*   No results found for this basename: LIPASE, AMYLASE,  in the last 168 hours No results found for this basename: AMMONIA,  in the last 168 hours CBC:  Recent Labs Lab 02/08/14 0502 02/09/14 0400 02/10/14 0825 02/11/14 0618 02/12/14 0322  WBC 7.3 6.1 14.7* 9.6 8.9  NEUTROABS 5.1 4.0 12.1* 6.8 6.1  HGB 7.9* 8.1* 8.2* 8.2* 7.8*  HCT 25.3* 25.3* 26.1* 26.4* 25.0*  MCV 76.7* 76.4* 77.9* 78.3 79.6  PLT 297 297 279 270 276   Cardiac Enzymes: No results found for this basename: CKTOTAL, CKMB, CKMBINDEX, TROPONINI,  in the last 168 hours BNP (last 3 results)  Recent Labs  01/30/14 2028  PROBNP 9165.0*   CBG:  Recent Labs Lab 02/11/14 1210 02/11/14 1711 02/11/14 2044 02/12/14 0729 02/12/14 1122  GLUCAP 214* 93 188* 148* 181*    Recent Results (from the past 240 hour(s))  CLOSTRIDIUM  DIFFICILE BY PCR     Status: Abnormal   Collection Time    02/10/14  9:39 AM      Result Value Ref Range Status   C difficile by pcr POSITIVE (*) NEGATIVE Final   Comment: CRITICAL RESULT CALLED TO, READ BACK BY AND VERIFIED WITH:     Bayard Beaver RN 11:35 02/10/14 (wilsonm)     Studies: No results found.  Scheduled Meds: . cloNIDine  0.1 mg Oral TID  . darbepoetin (ARANESP) injection - NON-DIALYSIS  100 mcg Subcutaneous Q Wed-1800  . enoxaparin (LOVENOX) injection  65 mg Subcutaneous Q12H  . FLUoxetine  10 mg Oral Daily  . furosemide  40 mg Oral BID  . insulin aspart  0-15 Units Subcutaneous TID WC  . isosorbide mononitrate  30 mg Oral Daily  . vancomycin  125 mg Oral QID  . warfarin  5 mg Oral ONCE-1800  . Warfarin - Pharmacist Dosing Inpatient   Does not apply q1800   Continuous Infusions: . sodium chloride Stopped (02/03/14 2256)    Active Problems:    Pulmonary tuberculosis   Hypertension   Diabetic neuropathy   Acute respiratory failure   New onset seizure   Diabetic proliferative retinopathy   DVT of popliteal vein   Acute on chronic diastolic heart failure   Nephrotic syndrome   Cardiac arrest   Suicide ideation   C. difficile colitis    Time spent:25 minute    Roselene Gray, [facility] Pager [phone]. If 7PM-7AM, please contact night-coverage at [email or web address], password [facility] 02/12/2014, 3:14 PM  LOS: 13 days

## 2014-02-12 NOTE — Progress Notes (Signed)
ANTICOAGULATION CONSULT NOTE - Follow Up Consult  Pharmacy Consult for Lovenox and Coumadin Indication: Recent bilateral DVT   Allergies  Allergen Reactions  . Aspirin Itching    Patient Measurements: Height: 5\' 6"  (167.6 cm) Weight: 145 lb 3.7 oz (65.877 kg) IBW/kg (Calculated) : 63.8   Vital Signs: Temp: 98.2 F (36.8 C) (03/21 0446) Temp src: Oral (03/21 0446) BP: 123/66 mmHg (03/21 0446) Pulse Rate: 74 (03/21 0446)  Labs:  Recent Labs  02/10/14 0825 02/11/14 0618 02/12/14 0322  HGB 8.2* 8.2* 7.8*  HCT 26.1* 26.4* 25.0*  PLT 279 270 276  LABPROT 20.2* 21.8* 24.3*  INR 1.78* 1.97* 2.27*  CREATININE 1.20 1.39* 1.43*    Estimated Creatinine Clearance: 48.3 ml/min (by C-G formula based on Cr of 1.43).  Assessment: 63 yo male admitted on 3/8 after cardiac arrest. Patient noted with recent DVT  (01/07/14) on coumadin and lovenox. Of note admit INR was 4.68 and s/p vitamin K on 3/9 and 3/10. Flagyl has been changed to po vancomycin. INR today is 2.27 with trend up.   PTA Coumadin regimen:  5mg  alternating with 7.5mg   Goal: INR 2-3  Plan:  -Coumadin 5mg  po today -Would consider discharge on home regimen as elevated INR likely due to acute setting on admit -Consider close INR monitoring after discharge   Hildred Laser, Pharm D 02/12/2014 7:50 AM

## 2014-02-13 LAB — CBC WITH DIFFERENTIAL/PLATELET
BASOS ABS: 0 10*3/uL (ref 0.0–0.1)
Basophils Relative: 0 % (ref 0–1)
EOS ABS: 0.3 10*3/uL (ref 0.0–0.7)
Eosinophils Relative: 3 % (ref 0–5)
HCT: 25 % — ABNORMAL LOW (ref 39.0–52.0)
Hemoglobin: 7.6 g/dL — ABNORMAL LOW (ref 13.0–17.0)
LYMPHS ABS: 1.6 10*3/uL (ref 0.7–4.0)
Lymphocytes Relative: 16 % (ref 12–46)
MCH: 24.4 pg — AB (ref 26.0–34.0)
MCHC: 30.4 g/dL (ref 30.0–36.0)
MCV: 80.1 fL (ref 78.0–100.0)
Monocytes Absolute: 1.2 10*3/uL — ABNORMAL HIGH (ref 0.1–1.0)
Monocytes Relative: 12 % (ref 3–12)
NEUTROS PCT: 69 % (ref 43–77)
Neutro Abs: 6.9 10*3/uL (ref 1.7–7.7)
PLATELETS: 317 10*3/uL (ref 150–400)
RBC: 3.12 MIL/uL — AB (ref 4.22–5.81)
RDW: 20.2 % — AB (ref 11.5–15.5)
WBC: 10 10*3/uL (ref 4.0–10.5)

## 2014-02-13 LAB — BASIC METABOLIC PANEL
BUN: 38 mg/dL — ABNORMAL HIGH (ref 6–23)
CALCIUM: 8.4 mg/dL (ref 8.4–10.5)
CO2: 33 mEq/L — ABNORMAL HIGH (ref 19–32)
CREATININE: 1.21 mg/dL (ref 0.50–1.35)
Chloride: 97 mEq/L (ref 96–112)
GFR calc Af Amer: 72 mL/min — ABNORMAL LOW (ref >90)
GFR calc non Af Amer: 62 mL/min — ABNORMAL LOW (ref >90)
Glucose, Bld: 134 mg/dL — ABNORMAL HIGH (ref 70–99)
Potassium: 3.8 mEq/L (ref 3.7–5.3)
Sodium: 139 mEq/L (ref 137–147)

## 2014-02-13 LAB — GLUCOSE, CAPILLARY
GLUCOSE-CAPILLARY: 164 mg/dL — AB (ref 70–99)
Glucose-Capillary: 122 mg/dL — ABNORMAL HIGH (ref 70–99)
Glucose-Capillary: 181 mg/dL — ABNORMAL HIGH (ref 70–99)
Glucose-Capillary: 212 mg/dL — ABNORMAL HIGH (ref 70–99)

## 2014-02-13 LAB — MAGNESIUM: MAGNESIUM: 1.8 mg/dL (ref 1.5–2.5)

## 2014-02-13 LAB — PROTIME-INR
INR: 2.09 — ABNORMAL HIGH (ref 0.00–1.49)
PROTHROMBIN TIME: 22.8 s — AB (ref 11.6–15.2)

## 2014-02-13 LAB — PREPARE RBC (CROSSMATCH)

## 2014-02-13 MED ORDER — WARFARIN SODIUM 7.5 MG PO TABS
7.5000 mg | ORAL_TABLET | Freq: Once | ORAL | Status: AC
  Administered 2014-02-13: 7.5 mg via ORAL
  Filled 2014-02-13: qty 1

## 2014-02-13 MED ORDER — FUROSEMIDE 10 MG/ML IJ SOLN
20.0000 mg | Freq: Once | INTRAMUSCULAR | Status: AC
  Administered 2014-02-13: 20 mg via INTRAVENOUS

## 2014-02-13 NOTE — Progress Notes (Addendum)
Patient ID: Joshua [name], male   DOB: Aug 09, 1951, 63 y.o.   MRN: [id number]  [facility] Progress Note    Assessment/ Plan:   1. 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 Serological studies have been negative for autoimmune glomerulonephritis/infection associated GN. He continues to diurese well on oral Lasix 40 mg twice a day. I would recommend starting an ACE inhibitor or ARB after resolution of his diarrhea-he does not need to schedule renal followup unless proteinuria continues to worsen and renal function declines. Risk factor modification with glycemic control, blood pressure management of sodium restriction will be key in his treatment.  2. Exacerbation of congestive heart failure: Patient with history of diastolic heart failure and recent PEA arrest with negative coronary angiography. Off negative chronotropic agents due to bradycardia. CHF improving with diuresis.  3. Chronic kidney disease stage III: Renal function improving now that his diarrhea is also improving. Continues to maintain stability of his electrolytes on furosemide 40 mg twice daily. 4. Hypertension: Acceptable on current regime with diuretics. Consider initiation of ACE inhibitor/ARB once diarrhea resolves primarily for proteinuria management but also for renal protection. 5. Anemia:S/P IV Fe, started on Aranesp.  6. Clostridium difficile colitis: Improving well on oral Vancomycin-for discharge to skilled nursing facility soon.  Will sign off for now- please call with questions  Subjective:   Reports to be feeling well-denies any shortness of breath and is still concerned about his pedal edema. Diarrhea frequency better.    Objective:   BP 142/45  Pulse 88  Temp(Src) 99.1 F (37.3 C) (Oral)  Resp 16  Ht 5\' 6"  (1.676 m)  Wt 65.877 kg (145 lb 3.7 oz)  BMI 23.45 kg/m2  SpO2 98%  Intake/Output Summary (Last 24 hours) at 02/13/14  [id number] Last data filed at 02/13/14 0859  Gross per 24 hour  Intake    480 ml  Output   1200 ml  Net   -720 ml   Weight change:   Physical Exam: Gen: Comfortably resting in bed CVS: Pulse regular in rate and rhythm, S1 and S2 with an ejection systolic murmur Resp: Poor inspiratory effort-no distinct rales or rhonchi Abd: Soft, obese, nontender and bowel sounds Ext: 2+ lower extremity edema  Imaging: No results found.  Labs: BMET  Recent Labs Lab 02/07/14 0452 02/07/14 1445 02/08/14 0502 02/09/14 0400 02/10/14 0825 02/11/14 0618 02/12/14 0322 02/13/14 0330  NA 137  --  140 138 137 138 137 139  K 4.2  --  3.7 3.9 3.2* 3.3* 3.9 3.8  CL 96  --  95* 91* 91* 93* 94* 97  CO2 33*  --  36* 35* 35* 36* 34* 33*  GLUCOSE 169*  --  132* 104* 122* 106* 126* 134*  BUN 37*  --  35* 38* 35* 38* 41* 38*  CREATININE 1.43*  --  1.33 1.21 1.20 1.39* 1.43* 1.21  CALCIUM 8.5  --  8.7 8.8 8.8 8.7 8.4 8.4  PHOS  --  3.6  --   --   --   --   --   --    CBC  Recent Labs Lab 02/10/14 0825 02/11/14 0618 02/12/14 0322 02/13/14 0330  WBC 14.7* 9.6 8.9 10.0  NEUTROABS 12.1* 6.8 6.1 6.9  HGB 8.2* 8.2* 7.8* 7.6*  HCT 26.1* 26.4* 25.0* 25.0*  MCV 77.9* 78.3 79.6 80.1  PLT 279 270 276 317   Medications:    . cloNIDine  0.1 mg  Oral TID  . darbepoetin (ARANESP) injection - NON-DIALYSIS  100 mcg Subcutaneous Q Wed-1800  . FLUoxetine  10 mg Oral Daily  . furosemide  20 mg Intravenous Once  . furosemide  40 mg Oral BID  . insulin aspart  0-15 Units Subcutaneous TID WC  . isosorbide mononitrate  30 mg Oral Daily  . vancomycin  125 mg Oral QID  . Warfarin - Pharmacist Dosing Inpatient   Does not apply [id number]   Elmarie Shiley, MD 02/13/2014, 9:37 AM

## 2014-02-13 NOTE — Progress Notes (Signed)
ANTICOAGULATION CONSULT NOTE - Follow Up Consult  Pharmacy Consult for Lovenox and Coumadin Indication: Recent bilateral DVT   Allergies  Allergen Reactions  . Aspirin Itching    Patient Measurements: Height: 5\' 6"  (167.6 cm) Weight: 145 lb 3.7 oz (65.877 kg) IBW/kg (Calculated) : 63.8   Vital Signs: Temp: 98.5 F (36.9 C) (03/22 1351) Temp src: Oral (03/22 1351) BP: 123/65 mmHg (03/22 1351) Pulse Rate: 89 (03/22 1351)  Labs:  Recent Labs  02/11/14 0618 02/12/14 0322 02/13/14 0330  HGB 8.2* 7.8* 7.6*  HCT 26.4* 25.0* 25.0*  PLT 270 276 317  LABPROT 21.8* 24.3* 22.8*  INR 1.97* 2.27* 2.09*  CREATININE 1.39* 1.43* 1.21    Estimated Creatinine Clearance: 57.1 ml/min (by C-G formula based on Cr of 1.21).  Assessment: 63 yo male admitted on 3/8 after cardiac arrest. Patient noted with recent DVT  (01/07/14) on coumadin and lovenox. Of note admit INR was 4.68 and s/p vitamin K on 3/9 and 3/10. Flagyl has been changed to po vancomycin. INR today is 2.09.   PTA Coumadin regimen:  5mg  alternating with 7.5mg   Goal: INR 2-3  Plan:  -Coumadin 7.5mg  po today -Noted for SNF soon -Would consider discharge on home regimen as elevated INR likely due to acute setting on admit    Hildred Laser, Pharm D 02/13/2014 2:35 PM

## 2014-02-13 NOTE — Discharge Summary (Addendum)
Physician Discharge Summary  Joshua [name] [id number] DOB: May 21, 1951 DOA: 01/30/2014  PCP: Duwaine Maxin, DO  Admit date: 01/30/2014 Discharge date: 02/13/2014  Time spent: 35 minutes  Recommendations for Outpatient Follow-up:  1. Need bmet to follow renal function.  2. INR and adjust coumadin as needed.  3. Follow up on resolution of c diff infection.  4. Adjust BP medications as needed.  5. Need to start ACE when diarrhea resolved and renal function stable.   Discharge Diagnoses:    Cardiac arrest   Acute on chronic diastolic heart failure   C diff colitis.    Nephrotic syndrome   DVT of popliteal vein   Pulmonary tuberculosis   Hypertension   Diabetic neuropathy   Acute respiratory failure   New onset seizure   Diabetic proliferative retinopathy     Suicide ideation   Discharge Condition: Stable.   Diet recommendation: Carb modified, thin consistency.   Filed Weights   02/10/14 0500 02/11/14 0500 02/12/14 0446  Weight: 66.134 kg (145 lb 12.8 oz) 65.499 kg (144 lb 6.4 oz) 65.877 kg (145 lb 3.7 oz)    History of present illness:  [name], [name], [name], [name], 1 amp bicarb, 1 amp calcium chorloride, and 3 amps of epi.  Transferred to [facility] for possible TTM post cardiac arrest.  PMH Recnt discharge 2/27 following left foot ulcer debridement (MRI at this time did not show osteomyelitis, debridement occurred on 2/23, wound culture with staph, placed on oral cephalexin with plans to discontinue 3/11), C. Diff infx, new DVT diagnosis and placed on coumadin (bilateral, dx on 2/13) , AKI, and Acalculous cholecystitis. PMH includes DM II, HTN, previously treated extrapulmonary TB (patient  is an immigrant from [place]), HFpEF (EF with grade 2 diastolic dysfunction, at discharge patient with volume overload), and hx of gi bleed (iron low, low retic). Patient with history of malnutrition as well. Since discharge has been on all his meds, no acute issues until today with distended abdomen, diarrhea, and pain.  Since arrival, patient with elevated blood pressure >200, GCS 10-T following commands, with 34 degrees Celsius temp. No arrhythmias, with EKG without evidence of st changes/cardiac ischemia. Patient with mild reductions in LVEF, IVC not collapsible on exam, otherwise no focal wall motion abnormalities. Trace pericardial effusion. Currently making urine. Intubated at OSH.   Hospital Course:  [name] PMHx DM type 2 with proliferative retinopathy/neuropathy, CKD STAGE III, HTN, diastolic CHF, Hx tuberculosis, Hx seizures, nephrotic syndrome, Hx DVT popliteal vein. 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 Patient was previously admitted to [facility] on [id number] which was complicated by bradycardia/hypotension, and seizure while in the ICU. Patient was intubated 2/13-2/14. Follow have bilateral peroneal DVTs, bilateral pleural effusions (negative for PE), acute acalculous cholecystitis.  Patient admitted on 01/30/2014 Patient undergoing outpatient kub due to diarrhea, abdominal distention (from rehab center) when he had asystole/PEA arrest with ROSC in 14 minutes.   Acute/Chronic diastolic CHF  -hold Norvasc 10 mg.  -hold hydralazine of SBP less than 120.  -Continue clonidine tablet 0.1 mg TID with holder parameters.  -Continue Imdur 30 mg daily  -Lasix changed to 40 mg BID.  -Negative 2200 L.  -weight  77---73---66   Anemia; will give one unit of PRBC prior to transfer to facility.   C diff colitis;  Patient develop diarrhea 3-18 overnight. C diff positive.  Change flagyl to oral vancomycin to avoid interaction with coumadin.  Continue with  Vancomycin.  Day 4/14 treatment.  Diarrhea improved, only 2 BM yesterday.   Left foot wound;  Does not appears infected. He has small amount of bleeding from wound.  Wound care consulted for local care.   Nephrotic syndrome  -3/15 spot protein/creatinine ratio= 3.67  -Continue with IV lasix for 24 to 48 hours. Then transition to oral.  -Appreciate Dr Posey Pronto help.  -cr decrease to 1.2 from 1.4.   Asystole  -Continue to avoid all AV nodal blocking agent.  -No further therapy per EP, Dr Lovena Le.   Acute respiratory failure  -Patient's continued O2 requirement most likely secondary to diastolic CHF decompensated  -See chronic diastolic CHF   Tachybradycardia syndrome  -See asystole   HTN  -See diastolic CHF  Seizure  -Currently not on seizure medication, patient unsure of any previous medication and cause.  -Patient states he was on seizure medication while hospitalized at one point but has never been on outpatient seizure medication   Diabetes type 2 with retinopathy/neuropathy  -Hemoglobin A1c on 11/26/2013= 6.5; within ADA guidelines  - SSI moderate.   Suicidal ideation  -Per psychiatry No evidence of imminent risk to self or others at present. D/C sitter  -Patient does not meet criteria for psychiatric inpatient admission.  -Per psychiatry start Fluoxetine 10 mg PO QD for depression   DVT popliteal vein  -Continue Coumadin per pharmacy; 3/17 INR= 2.0  Dysphasia?  -3/16 speech conduct bedside swallow; recommends Regular;Thin liquid  Liquid Administration via: Cup;Straw  Medication Administration: Whole meds with liquid  -3/17 continue carb modified diet   Procedures: PCXR 02/06/2014  1. Overall, improved pulmonary aeration compared to 02/02/2014 likely  from an interval decrease in interstitial pulmonary edema.  2. Persistently low inspiratory volumes with small bilateral pleural  effusions and bibasilar atelectasis. Superimposed infiltrate is  difficult to exclude  radiographically.  PCXR 02/02/2014  Hypoaeration. Central vascular congestion and interstitial and hazy  airspace opacities may reflect edema (favored) versus multifocal  infection.  Small effusions and bibasilar opacities, favor atelectasis.  Echocardiogram 01/31/2014  - Left ventricle: The cavity size was normal. Wall thickness was normal. Systolic function was normal. -LVEF= 55% to 60%.Systolic function was mildly reduced. - Aortic valve: Trivial regurgitation. - Left atrium: The atrium was mildly dilated. - Right ventricle: The cavity size was mildly dilated. - Right atrium: The atrium was moderately dilated. - Atrial septum: No defect or patent foramen ovale was identified.  On 01/14/2014  Normal renal size with increased parenchymal echogenicity,  consistent with medical renal disease. No evidence of  hydronephrosis.  Left pleural effusion incidentally noted.   Consultations: Dr.JONNALAGADDA,JANARDHAHA R. (psychiatry)  Dr. Patel(nephrology)    Discharge Exam: Filed Vitals:   02/13/14 0530  BP: 137/68  Pulse: 73  Temp: 99.1 F (37.3 C)  Resp: 16    General: no distress.  Cardiovascular: S 1, S 2 RRR Respiratory: CTA  Discharge Instructions      Discharge Orders   Future Orders Complete By Expires   Diet - low sodium heart healthy  As directed    Increase activity slowly  As directed        Medication List    STOP taking these medications       carvedilol 12.5  MG tablet  Commonly known as:  COREG     cephALEXin 500 MG capsule  Commonly known as:  KEFLEX     omeprazole 20 MG capsule  Commonly known as:  PRILOSEC     verapamil 120 MG CR tablet  Commonly known as:  CALAN-SR      TAKE these medications       acetaminophen 325 MG tablet  Commonly known as:  TYLENOL  Take 650 mg by mouth every 4 (four) hours as needed for mild pain, moderate pain or fever (Over 100 degrees.).     cloNIDine 0.1 MG tablet  Commonly known as:  CATAPRES  Take 1 tablet  (0.1 mg total) by mouth 3 (three) times daily.     darbepoetin 100 MCG/0.5ML Soln injection  Commonly known as:  ARANESP  Inject 0.5 mLs (100 mcg total) into the skin every Wednesday at 6 PM.     feeding supplement (PRO-STAT SUGAR FREE 64) Liqd  Take 30 mLs by mouth 2 (two) times daily with a meal.     ferrous sulfate 325 (65 FE) MG tablet  Take 1 tablet (325 mg total) by mouth 3 (three) times daily with meals.     FLUoxetine 10 MG capsule  Commonly known as:  PROZAC  Take 1 capsule (10 mg total) by mouth daily.     furosemide 40 MG tablet  Commonly known as:  LASIX  Take 1 tablet (40 mg total) by mouth 2 (two) times daily.     hydrALAZINE 50 MG tablet  Commonly known as:  APRESOLINE  Take 1 tablet (50 mg total) by mouth every 8 (eight) hours.     insulin aspart 100 UNIT/ML injection  Commonly known as:  novoLOG  Inject 2 Units into the skin every morning. Take 2 units at breakfast and 4 units at lunch and dinner     ipratropium-albuterol 0.5-2.5 (3) MG/3ML Soln  Commonly known as:  DUONEB  Take 3 mLs by nebulization every 4 (four) hours as needed (For SOB and Wheezing).     isosorbide mononitrate 30 MG 24 hr tablet  Commonly known as:  IMDUR  Take 1 tablet (30 mg total) by mouth daily.     vancomycin 50 mg/mL oral solution  Commonly known as:  VANCOCIN  Take 2.5 mLs (125 mg total) by mouth 4 (four) times daily.     warfarin 5 MG tablet  Commonly known as:  COUMADIN  Take 5-7.5 mg by mouth daily. Alternate 5mg  and 7.5mg  every other day       Allergies  Allergen Reactions  . Aspirin Itching      The results of significant diagnostics from this hospitalization (including imaging, microbiology, ancillary and laboratory) are listed below for reference.    Significant Diagnostic Studies: Ct Abdomen Pelvis Wo Contrast  01/30/2014   CLINICAL DATA:  Cardiac arrest.  EXAM: CT CHEST, ABDOMEN AND PELVIS WITHOUT CONTRAST  TECHNIQUE: Multidetector CT imaging of the chest,  abdomen and pelvis was performed following the standard protocol without IV contrast.  COMPARISON:  None.  CT CHEST W/O CM dated 01/07/2014; DG CHEST 1 VIEW dated 01/30/2014  FINDINGS: CT CHEST FINDINGS  The endotracheal tube extends into the right mainstem bronchus. This was immediately called to Dr. Dina Rich on 01/30/2014 at 3 p.m. Nasogastric tube extends into the abdomen. There appears to be prominent mediastinal lymph nodes which are similar to the previous examination but poorly characterized due to the lack of contrast and motion. There are small  bilateral pleural effusions, left side greater than right. No significant pericardial fluid. There is diffuse subcutaneous edema. There is compressive atelectasis in lower lobes bilaterally. Patchy densities in left upper lung. Airspace densities within the right middle lobe. There are subtle densities in the right upper lung. No evidence for a large pneumothorax. No acute bone abnormality.  CT ABDOMEN AND PELVIS FINDINGS  There is no evidence for free air. Nasogastric tube extends into the distal stomach. There is diffuse subcutaneous edema. There is a small amount of abdominal ascites. Unenhanced CT was performed per clinician order. Lack of IV contrast limits sensitivity and specificity, especially for evaluation of abdominal/pelvic solid viscera. Gallbladder may be mildly distended but poorly characterized. No gross abnormality to the liver or spleen. Limited evaluation of the pancreas and adrenal glands. No gross abnormality to the kidneys. There are prominent small bowel loops in the left abdomen with mesenteric edema. Bowel wall thickening, particularly on sequence 2, image 86. Fluid in the urinary bladder. No acute bone abnormality.  IMPRESSION: The study is very limited due to the lack of contrast and motion.  Prominent small bowel loops in the left abdomen with mesenteric edema. There is concern for bowel wall thickening in this area. Based on the history of  cardiac arrest, ischemic bowel is in the differential diagnosis. Findings could also be related to an infectious enteritis.  Bilateral small pleural effusions with patchy parenchymal lung densities. Findings could represent atelectasis and asymmetric edema. Infection cannot excluded, particularly in the right middle lobe.  Endotracheal tube in the right mainstem bronchus as described.  Small amount of abdominal ascites and diffuse subcutaneous edema. Findings are concerning for anasarca.  Mild distention of the gallbladder.   Electronically Signed   By: Markus Daft M.D.   On: 01/30/2014 15:18   Dg Chest 1 View  01/30/2014   CLINICAL DATA:  Status post cardiac arrest ; assess endotracheal tube and nasogastric tube position  EXAM: CHEST - 1 VIEW  COMPARISON:  CT CHEST W/O CM dated 01/30/2014  FINDINGS: The trachea has undergone interval intubation. The tip of the ET tube lies in the proximal right mainstem bronchus. The lung volumes are reasonably well maintained. There are coarse interstitial markings bilaterally with near confluence at the right lung base.  The esophagogastric tube tip and proximal port project below the inferior margin of the film. The cardiac silhouette is mildly enlarged. The pulmonary vascularity is not engorged. External pacemaker pads are present.  IMPRESSION: 1. The endotracheal tube tip is at the margin of the right main stem bronchus and withdrawal by 5 cm is recommended. 2. The esophagogastric tube appears to be in appropriate position. 3. The pulmonary interstitial markings are mildly increased bilaterally especially on the right. Confluent density at the right lung base suggests atelectasis or pneumonia. 4. These results were called by telephone at the time of interpretation on 01/30/2014 at 2:55 PM to Doylene Canard, RN,, who verbally acknowledged these results.   Electronically Signed   By: David  Martinique   On: 01/30/2014 14:56   Dg Chest 2 View  01/26/2014   CLINICAL DATA:  Shortness of  breath, lower extremity edema  EXAM: CHEST  2 VIEW  COMPARISON:  01/20/2014  FINDINGS: Cardiomegaly again noted. Bilateral small pleural effusion with bilateral basilar atelectasis or infiltrate. No pulmonary edema. Mild degenerative changes thoracic spine.  IMPRESSION: Cardiomegaly. Bilateral small pleural effusion with bilateral basilar atelectasis or infiltrate.   Electronically Signed   By: Orlean Bradford.D.  On: 01/26/2014 11:35   Dg Chest 2 View  01/20/2014   CLINICAL DATA:  Followup right lung volume loss. Shortness of breath.  EXAM: CHEST  2 VIEW  COMPARISON:  01/08/2014 and 01/07/2014  FINDINGS: Interval removal of nasogastric tube, right subclavian central venous catheter and endotracheal tubes. Lungs are hypoinflated with continued evidence of a small left pleural effusion likely with associated atelectasis in the left base. There is slightly better aeration in the left base compared to the previous exam. There is slight worsening of a small right pleural effusion likely with associated atelectasis. Stable cardiomegaly. Remainder the exam is unchanged.  IMPRESSION: Mild worsening of a small right pleural effusion likely with associated right basilar atelectasis. Persistent small left pleural effusion without significant change. Persistent opacification in the left base with slightly better aeration which may be due to atelectasis or infection.   Electronically Signed   By: Marin Olp M.D.   On: 01/20/2014 12:18   Dg Abd 1 View  01/30/2014   CLINICAL DATA:  Abdominal tenderness and loose stools.  EXAM: ABDOMEN - 1 VIEW  COMPARISON:  DG ABD PORTABLE 1V dated 01/07/2014  FINDINGS: The bowel gas pattern is normal. No obstruction or significant ileus is visualized. No abnormal calcifications or soft tissue abnormalities are identified. Mild degenerative changes are present of the lower lumbar spine.  IMPRESSION: Unremarkable abdominal radiograph.   Electronically Signed   By: Aletta Edouard M.D.   On:  01/30/2014 14:24   Ct Head Wo Contrast  01/30/2014   CLINICAL DATA:  Status post cardiac arrest, query intracranial hemorrhage.  EXAM: CT HEAD WITHOUT CONTRAST  TECHNIQUE: Contiguous axial images were obtained from the base of the skull through the vertex without intravenous contrast.  COMPARISON:  CT HEAD W/O CM dated 01/07/2014  FINDINGS: The brainstem, cerebellum, cerebral peduncles, thalamus, basal ganglia, basilar cisterns, and ventricular system appear within normal limits. Periventricular white matter and corona radiata hypodensities favor chronic ischemic microvascular white matter disease. No intracranial hemorrhage, mass lesion, or acute CVA. Mucosal thickening in the nasal cavity noted. The mild chronic maxillary, ethmoid, and left sphenoid sinusitis.  IMPRESSION: 1. No intracranial hemorrhage, findings of acute CVA, or other acute intracranial abnormality. 2. Chronic paranasal sinusitis with mucosal swelling and frothy fluid in the nasal cavity.   Electronically Signed   By: Sherryl Barters M.D.   On: 01/30/2014 16:04   Ct Chest Wo Contrast  01/30/2014   CLINICAL DATA:  Cardiac arrest.  EXAM: CT CHEST, ABDOMEN AND PELVIS WITHOUT CONTRAST  TECHNIQUE: Multidetector CT imaging of the chest, abdomen and pelvis was performed following the standard protocol without IV contrast.  COMPARISON:  None.  CT CHEST W/O CM dated 01/07/2014; DG CHEST 1 VIEW dated 01/30/2014  FINDINGS: CT CHEST FINDINGS  The endotracheal tube extends into the right mainstem bronchus. This was immediately called to Dr. Dina Rich on 01/30/2014 at 3 p.m. Nasogastric tube extends into the abdomen. There appears to be prominent mediastinal lymph nodes which are similar to the previous examination but poorly characterized due to the lack of contrast and motion. There are small bilateral pleural effusions, left side greater than right. No significant pericardial fluid. There is diffuse subcutaneous edema. There is compressive atelectasis in lower  lobes bilaterally. Patchy densities in left upper lung. Airspace densities within the right middle lobe. There are subtle densities in the right upper lung. No evidence for a large pneumothorax. No acute bone abnormality.  CT ABDOMEN AND PELVIS FINDINGS  There is no  evidence for free air. Nasogastric tube extends into the distal stomach. There is diffuse subcutaneous edema. There is a small amount of abdominal ascites. Unenhanced CT was performed per clinician order. Lack of IV contrast limits sensitivity and specificity, especially for evaluation of abdominal/pelvic solid viscera. Gallbladder may be mildly distended but poorly characterized. No gross abnormality to the liver or spleen. Limited evaluation of the pancreas and adrenal glands. No gross abnormality to the kidneys. There are prominent small bowel loops in the left abdomen with mesenteric edema. Bowel wall thickening, particularly on sequence 2, image 86. Fluid in the urinary bladder. No acute bone abnormality.  IMPRESSION: The study is very limited due to the lack of contrast and motion.  Prominent small bowel loops in the left abdomen with mesenteric edema. There is concern for bowel wall thickening in this area. Based on the history of cardiac arrest, ischemic bowel is in the differential diagnosis. Findings could also be related to an infectious enteritis.  Bilateral small pleural effusions with patchy parenchymal lung densities. Findings could represent atelectasis and asymmetric edema. Infection cannot excluded, particularly in the right middle lobe.  Endotracheal tube in the right mainstem bronchus as described.  Small amount of abdominal ascites and diffuse subcutaneous edema. Findings are concerning for anasarca.  Mild distention of the gallbladder.   Electronically Signed   By: Markus Daft M.D.   On: 01/30/2014 15:18   Nm Hepatobiliary  01/12/2014   CLINICAL DATA:  Upper abdominal pain  EXAM: NUCLEAR MEDICINE HEPATOBILIARY IMAGING  Views:   Anterior right upper quadrant  Radionuclide:  Technetium 61m Choletec  Dose:  5.0 mCi  Route of administration: Intravenous  COMPARISON:  None.  FINDINGS: Liver uptake of radiotracer is normal. There is prompt visualization of gallbladder and small bowel, indicating patency of the cystic and common bile ducts.  IMPRESSION: Study within normal limits.   Electronically Signed   By: Lowella Grip M.D.   On: 01/12/2014 13:14   US Renal  01/14/2014   CLINICAL DATA:  Acute kidney injury.  Renal insufficiency.  EXAM: RENAL/URINARY TRACT ULTRASOUND COMPLETE  COMPARISON:  None.  FINDINGS: Right Kidney:  Length: 11.6 cm. Diffusely increased parenchymal echogenicity seen, consistent with medical renal disease. No mass or hydronephrosis visualized.  Left Kidney:  Length: 11.9 cm. Diffusely increased parenchymal echogenicity seen, consistent with medical renal disease. No mass or hydronephrosis visualized. Left pleural effusion also noted.  Bladder:  Empty with Foley catheter in place.  IMPRESSION: Normal renal size with increased parenchymal echogenicity, consistent with medical renal disease. No evidence of hydronephrosis.  Left pleural effusion incidentally noted.   Electronically Signed   By: Earle Gell M.D.   On: 01/14/2014 21:09   Nm Pulmonary Perf And Vent  02/01/2014   CLINICAL DATA Shortness of breath, evaluate for pulmonary embolism  EXAM NUCLEAR MEDICINE VENTILATION - PERFUSION LUNG SCAN  TECHNIQUE Ventilation images were obtained in multiple projections using inhaled aerosol technetium 99 M DTPA. Perfusion images were obtained in multiple projections after intravenous injection of Tc-8m MAA.  RADIOPHARMACEUTICALS 40 mCi Tc-38m DTPA aerosol and 6 mCi Tc-69m MAA  COMPARISON NM PULMONARY VENT & PERF dated 01/09/2014; DG CHEST 1V PORT dated 01/30/2014; DG CHEST 1 VIEW dated 01/30/2014; DG CHEST 2 VIEW dated 01/26/2014  FINDINGS Review of chest radiograph performed 01/30/2014 demonstrates an enlarged cardiac  silhouette and mediastinal contours. Stable position of support apparatus. No pneumothorax. External pacing pads overlie left ventricular apex. The pulmonary vasculature is indistinct with cephalization of flow. Grossly  unchanged perihilar and bilateral medial basilar opacities. Unchanged small bilateral effusions. No definite pneumothorax.  Ventilation: Review the ventilatory images demonstrates clumping of inhaled radiotracer about the bilateral pulmonary hila, left greater than right. Inhaled radiotracer is seen layering within the left mainstem bronchus. There is overall poor ventilation of the bilateral lungs. A minimal amount of radiotracer is seen within the hypopharynx.  Perfusion: There is relative homogeneous distribution of injected radiotracer throughout the bilateral pulmonary parenchyma. There is minimal oligemia about the right major fissure and blunting of the left costophrenic angle, both of which are unchanged and likely secondary to the patient's known small bilateral pleural effusions. No discrete mismatched segmental or subsegmental filling defects.  IMPRESSION Pulmonary embolism absent (very low probability for pulmonary embolism).  SIGNATURE  Electronically Signed   By: Sandi Mariscal M.D.   On: 02/01/2014 16:09   US Abdomen Port  01/31/2014   CLINICAL DATA:  Elevated liver function tests.  EXAM: ULTRASOUND PORTABLE ABDOMEN  COMPARISON:  CT of the abdomen and pelvis 01/30/2014. Abdominal ultrasound 01/08/2014.  FINDINGS: Gallbladder:  No gallstones. Gallbladder is only partially distended. Gallbladder wall thickness is slightly increased at 4.4 mm (likely a reflection of under distention). No pericholecystic fluid. Per report from the sonographer, the patient did not exhibit a sonographic Murphy's sign on examination.  Common bile duct:  Diameter: Normal in caliber measuring 5.6 mm in the porta hepatis.  Liver:  No focal lesion identified. Within normal limits in parenchymal echogenicity. No  intrahepatic biliary ductal dilatation.  IVC:  No abnormality visualized.  Pancreas:  Visualized portion unremarkable.  Spleen:  Size and appearance within normal limits. Measures 5.5 cm in length.  Right Kidney:  Length: 12.5 cm. Echogenicity within normal limits. No mass or hydronephrosis visualized.  Left Kidney:  Length: 12.4 cm. Echogenicity within normal limits. No mass or hydronephrosis visualized.  Abdominal aorta:  Largely obscured by overlying bowel gas. Measures up to 2.5 cm in diameter proximally.  Other findings:  Small volume of ascites.  Bilateral pleural effusions.  IMPRESSION: 1. No definite acute findings to account for the patient's symptoms. 2. Although the gallbladder wall appears mildly thickened at 4.4 mm, this is nonspecific and likely related to under distention of the gallbladder on today's examination, as well as a generalized state of anasarca given the presence of ascites and pleural effusions. No definite findings to suggest acute cholecystitis are noted at this time. Additionally, there is no intra or extrahepatic biliary ductal dilatation.   Electronically Signed   By: Vinnie Langton M.D.   On: 01/31/2014 07:18   Dg Chest Port 1 View  02/06/2014   CLINICAL DATA:  Increasing oxygen requirement  EXAM: PORTABLE CHEST - 1 VIEW  COMPARISON:  Prior chest x-ray 02/02/2014  FINDINGS: Persistently low inspiratory volumes with bibasilar atelectasis. Overall, the pulmonary aeration has improved compared to the prior radiograph likely secondary to a decrease in interstitial edema. There are persistent left slightly larger than right small layering pleural effusions. The cardiac and mediastinal contours are unchanged. No pneumothorax. No acute osseous abnormality.  IMPRESSION: 1. Overall, improved pulmonary aeration compared to 02/02/2014 likely from an interval decrease in interstitial pulmonary edema. 2. Persistently low inspiratory volumes with small bilateral pleural effusions and  bibasilar atelectasis. Superimposed infiltrate is difficult to exclude radiographically.   Electronically Signed   By: Jacqulynn Cadet M.D.   On: 02/06/2014 09:17   Dg Chest Port 1 View  02/02/2014   CLINICAL DATA Dyspnea  EXAM PORTABLE CHEST -  1 VIEW  COMPARISON 01/30/2014  FINDINGS Cardiomediastinal contours are mildly prominent. Central vascular congestion. Interstitial prominence and patchy bibasilar opacities. Small effusions not excluded. No pneumothorax. Multilevel degenerative change. Multiple wires/tubes are presumably external.  IMPRESSION Hypoaeration. Central vascular congestion and interstitial and hazy airspace opacities may reflect edema (favored) versus multifocal infection.  Small effusions and bibasilar opacities, favor atelectasis.  SIGNATURE  Electronically Signed   By: Carlos Levering M.D.   On: 02/02/2014 22:59   Dg Chest Port 1 View  01/30/2014   CLINICAL DATA:  Central line placement.  EXAM: PORTABLE CHEST - 1 VIEW  COMPARISON:  01/30/2014 1431 hr  FINDINGS: Right internal jugular central venous line has its tip in the lower superior vena cava. There is no pneumothorax.  Endotracheal tube has been retracted. Tip now lies 3.7 cm above the chronic, well positioned.  Nasogastric tube is stable passing well into the stomach. Lung base opacity is also unchanged.  IMPRESSION: 1. Right internal jugular central venous line tip in the lower superior vena cava. No pneumothorax. 2. Endotracheal tube well positioned.   Electronically Signed   By: Lajean Manes M.D.   On: 01/30/2014 21:01    Microbiology: Recent Results (from the past 240 hour(s))  CLOSTRIDIUM DIFFICILE BY PCR     Status: Abnormal   Collection Time    02/10/14  9:39 AM      Result Value Ref Range Status   C difficile by pcr POSITIVE (*) NEGATIVE Final   Comment: CRITICAL RESULT CALLED TO, READ BACK BY AND VERIFIED WITH:     Bayard Beaver RN 11:35 02/10/14 ([email or web address])     Labs: Basic Metabolic Panel:  Recent Labs Lab  02/07/14 1445  02/09/14 0400 02/10/14 0825 02/11/14 0618 02/12/14 0322 02/13/14 0330  NA  --   < > 138 137 138 137 139  K  --   < > 3.9 3.2* 3.3* 3.9 3.8  CL  --   < > 91* 91* 93* 94* 97  CO2  --   < > 35* 35* 36* 34* 33*  GLUCOSE  --   < > 104* 122* 106* 126* 134*  BUN  --   < > 38* 35* 38* 41* 38*  CREATININE  --   < > 1.21 1.20 1.39* 1.43* 1.21  CALCIUM  --   < > 8.8 8.8 8.7 8.4 8.4  MG  --   < > 1.6 1.6 1.8 1.8 1.8  PHOS 3.6  --   --   --   --   --   --   < > = values in this interval not displayed. Liver Function Tests:  Recent Labs Lab 02/07/14 0452 02/08/14 0502 02/09/14 0400 02/10/14 0825  AST 32 22 27 23   ALT 29 25 27 23   ALKPHOS 319* 292* 291* 269*  BILITOT 0.3 0.3 0.3 0.3  PROT 6.5 6.5 6.7 6.6  ALBUMIN 1.9* 1.9* 2.0* 2.0*   No results found for this basename: LIPASE, AMYLASE,  in the last 168 hours No results found for this basename: AMMONIA,  in the last 168 hours CBC:  Recent Labs Lab 02/09/14 0400 02/10/14 0825 02/11/14 0618 02/12/14 0322 02/13/14 0330  WBC 6.1 14.7* 9.6 8.9 10.0  NEUTROABS 4.0 12.1* 6.8 6.1 6.9  HGB 8.1* 8.2* 8.2* 7.8* 7.6*  HCT 25.3* 26.1* 26.4* 25.0* 25.0*  MCV 76.4* 77.9* 78.3 79.6 80.1  PLT 297 279 270 276 317   Cardiac Enzymes: No results found for this basename: CKTOTAL, CKMB,  CKMBINDEX, TROPONINI,  in the last 168 hours BNP: BNP (last 3 results)  Recent Labs  01/30/14 2028  PROBNP 9165.0*   CBG:  Recent Labs Lab 02/12/14 0729 02/12/14 1122 02/12/14 1651 02/12/14 2012 02/13/14 0733  GLUCAP 148* 181* 171* 157* 122*       Signed:  Marvel Sapp A  [facility] 02/13/2014, 8:44 AM

## 2014-02-14 ENCOUNTER — Inpatient Hospital Stay
Admission: RE | Admit: 2014-02-14 | Discharge: 2014-04-13 | Disposition: A | Discharge status: Nursing Home (Includes ICF) | Payer: PRIVATE HEALTH INSURANCE | Source: Ambulatory Visit | Attending: Internal Medicine | Admitting: Internal Medicine

## 2014-02-14 DIAGNOSIS — R195 Other fecal abnormalities: Principal | ICD-10-CM

## 2014-02-14 DIAGNOSIS — A0472 Enterocolitis due to Clostridium difficile, not specified as recurrent: Secondary | ICD-10-CM

## 2014-02-14 DIAGNOSIS — N189 Chronic kidney disease, unspecified: Secondary | ICD-10-CM

## 2014-02-14 LAB — BASIC METABOLIC PANEL
BUN: 34 mg/dL — AB (ref 6–23)
CO2: 31 mEq/L (ref 19–32)
Calcium: 8.7 mg/dL (ref 8.4–10.5)
Chloride: 97 mEq/L (ref 96–112)
Creatinine, Ser: 1.08 mg/dL (ref 0.50–1.35)
GFR calc Af Amer: 83 mL/min — ABNORMAL LOW (ref >90)
GFR calc non Af Amer: 72 mL/min — ABNORMAL LOW (ref >90)
GLUCOSE: 146 mg/dL — AB (ref 70–99)
POTASSIUM: 3.6 meq/L — AB (ref 3.7–5.3)
SODIUM: 138 meq/L (ref 137–147)

## 2014-02-14 LAB — CBC WITH DIFFERENTIAL/PLATELET
BASOS PCT: 0 % (ref 0–1)
Basophils Absolute: 0 10*3/uL (ref 0.0–0.1)
Eosinophils Absolute: 0.3 10*3/uL (ref 0.0–0.7)
Eosinophils Relative: 3 % (ref 0–5)
HCT: 28.8 % — ABNORMAL LOW (ref 39.0–52.0)
HEMOGLOBIN: 9.1 g/dL — AB (ref 13.0–17.0)
LYMPHS ABS: 1.8 10*3/uL (ref 0.7–4.0)
LYMPHS PCT: 17 % (ref 12–46)
MCH: 24.6 pg — ABNORMAL LOW (ref 26.0–34.0)
MCHC: 31.6 g/dL (ref 30.0–36.0)
MCV: 77.8 fL — AB (ref 78.0–100.0)
MONOS PCT: 12 % (ref 3–12)
Monocytes Absolute: 1.3 10*3/uL — ABNORMAL HIGH (ref 0.1–1.0)
Neutro Abs: 7.4 10*3/uL (ref 1.7–7.7)
Neutrophils Relative %: 68 % (ref 43–77)
Platelets: 342 10*3/uL (ref 150–400)
RBC: 3.7 MIL/uL — ABNORMAL LOW (ref 4.22–5.81)
RDW: 22.6 % — ABNORMAL HIGH (ref 11.5–15.5)
WBC: 10.8 10*3/uL — ABNORMAL HIGH (ref 4.0–10.5)

## 2014-02-14 LAB — TYPE AND SCREEN
ABO/RH(D): O POS
Antibody Screen: NEGATIVE
Unit division: 0

## 2014-02-14 LAB — PROTIME-INR
INR: 1.87 — AB (ref 0.00–1.49)
Prothrombin Time: 21 seconds — ABNORMAL HIGH (ref 11.6–15.2)

## 2014-02-14 LAB — GLUCOSE, CAPILLARY
GLUCOSE-CAPILLARY: 174 mg/dL — AB (ref 70–99)
Glucose-Capillary: 136 mg/dL — ABNORMAL HIGH (ref 70–99)

## 2014-02-14 LAB — MAGNESIUM: Magnesium: 1.8 mg/dL (ref 1.5–2.5)

## 2014-02-14 MED ORDER — ZOLPIDEM TARTRATE 5 MG PO TABS
5.0000 mg | ORAL_TABLET | Freq: Once | ORAL | Status: AC
  Administered 2014-02-14: 5 mg via ORAL
  Filled 2014-02-14: qty 1

## 2014-02-14 MED ORDER — WARFARIN SODIUM 7.5 MG PO TABS
7.5000 mg | ORAL_TABLET | Freq: Once | ORAL | Status: AC
  Administered 2014-02-14: 7.5 mg via ORAL
  Filled 2014-02-14: qty 1

## 2014-02-14 MED ORDER — POTASSIUM CHLORIDE CRYS ER 20 MEQ PO TBCR
20.0000 meq | EXTENDED_RELEASE_TABLET | Freq: Once | ORAL | Status: AC
  Administered 2014-02-14: 20 meq via ORAL
  Filled 2014-02-14: qty 1

## 2014-02-14 NOTE — Progress Notes (Signed)
Clinical social worker assisted with patient discharge to skilled nursing facility, [facility].  CSW addressed all family questions and concerns. CSW copied chart and added all important documents. CSW also set up patient transportation with [employer] operational officer. Clinical Social Worker will sign off for now as social work intervention is no longer needed.   Rhea Pink, MSW, [place]

## 2014-02-14 NOTE — Discharge Summary (Signed)
Physician Discharge Summary  Joshua [name] [id number] DOB: 10-12-1951 DOA: 01/30/2014  PCP: Joshua Maxin, DO  Admit date: 01/30/2014 Discharge date: 02/14/2014  Time spent: 35 minutes  Recommendations for Outpatient Follow-up:  1. Need bmet to follow renal function.  2. INR and adjust coumadin as needed.  3. Follow up on resolution of c diff infection.  4. Adjust BP medications as needed.  5. Need to start ACE when diarrhea resolved and renal function stable to help with proteinuria.    Discharge Diagnoses:    Cardiac arrest   Acute on chronic diastolic heart failure   C diff colitis.    Nephrotic syndrome   DVT of popliteal vein   Pulmonary tuberculosis   Hypertension   Diabetic neuropathy   Acute respiratory failure   New onset seizure   Diabetic proliferative retinopathy     Suicide ideation   Discharge Condition: Stable.   Diet recommendation: Carb modified, thin consistency.   Filed Weights   02/10/14 0500 02/11/14 0500 02/12/14 0446  Weight: 66.134 kg (145 lb 12.8 oz) 65.499 kg (144 lb 6.4 oz) 65.877 kg (145 lb 3.7 oz)    History of present illness:  [name], [name], [name], [name], 1 amp bicarb, 1 amp calcium chorloride, and 3 amps of epi.  Transferred to [facility] for possible TTM post cardiac arrest.  PMH Recnt discharge 2/27 following left foot ulcer debridement (MRI at this time did not show osteomyelitis, debridement occurred on 2/23, wound culture with staph, placed on oral cephalexin with plans to discontinue 3/11), C. Diff infx, new DVT diagnosis and placed on coumadin (bilateral, dx on 2/13) , AKI, and Acalculous cholecystitis. PMH includes DM II, HTN, previously treated  extrapulmonary TB (patient is an immigrant from [place]), HFpEF (EF with grade 2 diastolic dysfunction, at discharge patient with volume overload), and hx of gi bleed (iron low, low retic). Patient with history of malnutrition as well. Since discharge has been on all his meds, no acute issues until today with distended abdomen, diarrhea, and pain.  Since arrival, patient with elevated blood pressure >200, GCS 10-T following commands, with 34 degrees Celsius temp. No arrhythmias, with EKG without evidence of st changes/cardiac ischemia. Patient with mild reductions in LVEF, IVC not collapsible on exam, otherwise no focal wall motion abnormalities. Trace pericardial effusion. Currently making urine. Intubated at OSH.   Hospital Course:  [name] PMHx DM type 2 with proliferative retinopathy/neuropathy, CKD STAGE III, HTN, diastolic CHF, Hx tuberculosis, Hx seizures, nephrotic syndrome, Hx DVT popliteal vein. 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 Patient was previously admitted to [facility] on [id number] which was complicated by bradycardia/hypotension, and seizure while in the ICU. Patient was intubated 2/13-2/14. Follow have bilateral peroneal DVTs, bilateral pleural effusions (negative for PE), acute acalculous cholecystitis.  Patient admitted on 01/30/2014 Patient undergoing outpatient kub due to diarrhea, abdominal distention (from rehab center) when he had asystole/PEA arrest with ROSC in 14 minutes.   Acute/Chronic diastolic CHF  -hold Norvasc 10 mg.  -hold hydralazine of SBP less than 120.  -Continue clonidine tablet 0.1 mg TID with holder parameters.  -Continue Imdur 30 mg daily  -Lasix changed to 40 mg BID.  -  Negative 2200 L.  -weight 77---73---66   Anemia; will give one unit of PRBC prior to transfer to facility.   C diff colitis;  Patient develop diarrhea 3-18 overnight. C diff positive.  Change flagyl to oral vancomycin to avoid interaction with  coumadin.  Continue with Vancomycin.  Day 5/14 treatment.  Diarrhea improved, only 2 BM yesterday.   Left foot wound;  Does not appears infected. He has small amount of bleeding from wound.  Wound care consulted for local care.   Nephrotic syndrome  -3/15 spot protein/creatinine ratio= 3.67  -Continue with IV lasix for 24 to 48 hours. Then transition to oral.  -Appreciate Dr Posey Pronto help.  -cr decrease to 1.2 from 1.4.   Asystole  -Continue to avoid all AV nodal blocking agent.  -No further therapy per EP, Dr Lovena Le.   Acute respiratory failure  -Patient's continued O2 requirement most likely secondary to diastolic CHF decompensated  -See chronic diastolic CHF   Tachybradycardia syndrome  -See asystole   HTN  -See diastolic CHF  Seizure  -Currently not on seizure medication, patient unsure of any previous medication and cause.  -Patient states he was on seizure medication while hospitalized at one point but has never been on outpatient seizure medication   Diabetes type 2 with retinopathy/neuropathy  -Hemoglobin A1c on 11/26/2013= 6.5; within ADA guidelines  - SSI moderate.   Suicidal ideation  -Per psychiatry No evidence of imminent risk to self or others at present. D/C sitter  -Patient does not meet criteria for psychiatric inpatient admission.  -Per psychiatry start Fluoxetine 10 mg PO QD for depression   DVT popliteal vein  -Continue Coumadin per pharmacy; 3/17 INR= 2.0  Dysphasia?  -3/16 speech conduct bedside swallow; recommends Regular;Thin liquid  Liquid Administration via: Cup;Straw  Medication Administration: Whole meds with liquid  -3/17 continue carb modified diet   Procedures: PCXR 02/06/2014  1. Overall, improved pulmonary aeration compared to 02/02/2014 likely  from an interval decrease in interstitial pulmonary edema.  2. Persistently low inspiratory volumes with small bilateral pleural  effusions and bibasilar atelectasis. Superimposed infiltrate is   difficult to exclude radiographically.  PCXR 02/02/2014  Hypoaeration. Central vascular congestion and interstitial and hazy  airspace opacities may reflect edema (favored) versus multifocal  infection.  Small effusions and bibasilar opacities, favor atelectasis.  Echocardiogram 01/31/2014  - Left ventricle: The cavity size was normal. Wall thickness was normal. Systolic function was normal. -LVEF= 55% to 60%.Systolic function was mildly reduced. - Aortic valve: Trivial regurgitation. - Left atrium: The atrium was mildly dilated. - Right ventricle: The cavity size was mildly dilated. - Right atrium: The atrium was moderately dilated. - Atrial septum: No defect or patent foramen ovale was identified.  On 01/14/2014  Normal renal size with increased parenchymal echogenicity,  consistent with medical renal disease. No evidence of  hydronephrosis.  Left pleural effusion incidentally noted.   Consultations: Dr.JONNALAGADDA,JANARDHAHA R. (psychiatry)  Dr. Patel(nephrology)    Discharge Exam: Filed Vitals:   02/14/14 0544  BP: 161/84  Pulse: 86  Temp: 98.5 F (36.9 C)  Resp: 16    General: no distress.  Cardiovascular: S 1, S 2 RRR Respiratory: CTA  Discharge Instructions      Discharge Orders   Future Orders Complete By Expires   Diet - low sodium heart healthy  As directed    Diet - low sodium heart healthy  As directed    Increase activity slowly  As directed    Increase activity slowly  As directed        Medication List    STOP taking these medications       carvedilol 12.5 MG tablet  Commonly known as:  COREG     cephALEXin 500 MG capsule  Commonly known as:  KEFLEX     omeprazole 20 MG capsule  Commonly known as:  PRILOSEC     verapamil 120 MG CR tablet  Commonly known as:  CALAN-SR      TAKE these medications       acetaminophen 325 MG tablet  Commonly known as:  TYLENOL  Take 650 mg by mouth every 4 (four) hours as needed for mild pain,  moderate pain or fever (Over 100 degrees.).     cloNIDine 0.1 MG tablet  Commonly known as:  CATAPRES  Take 1 tablet (0.1 mg total) by mouth 3 (three) times daily.     darbepoetin 100 MCG/0.5ML Soln injection  Commonly known as:  ARANESP  Inject 0.5 mLs (100 mcg total) into the skin every Wednesday at 6 PM.     feeding supplement (PRO-STAT SUGAR FREE 64) Liqd  Take 30 mLs by mouth 2 (two) times daily with a meal.     ferrous sulfate 325 (65 FE) MG tablet  Take 1 tablet (325 mg total) by mouth 3 (three) times daily with meals.     FLUoxetine 10 MG capsule  Commonly known as:  PROZAC  Take 1 capsule (10 mg total) by mouth daily.     furosemide 40 MG tablet  Commonly known as:  LASIX  Take 1 tablet (40 mg total) by mouth 2 (two) times daily.     hydrALAZINE 50 MG tablet  Commonly known as:  APRESOLINE  Take 1 tablet (50 mg total) by mouth every 8 (eight) hours.     insulin aspart 100 UNIT/ML injection  Commonly known as:  novoLOG  Inject 2 Units into the skin every morning. Take 2 units at breakfast and 4 units at lunch and dinner     ipratropium-albuterol 0.5-2.5 (3) MG/3ML Soln  Commonly known as:  DUONEB  Take 3 mLs by nebulization every 4 (four) hours as needed (For SOB and Wheezing).     isosorbide mononitrate 30 MG 24 hr tablet  Commonly known as:  IMDUR  Take 1 tablet (30 mg total) by mouth daily.     vancomycin 50 mg/mL oral solution  Commonly known as:  VANCOCIN  Take 2.5 mLs (125 mg total) by mouth 4 (four) times daily.     warfarin 5 MG tablet  Commonly known as:  COUMADIN  Take 5-7.5 mg by mouth daily. Alternate 5mg  and 7.5mg  every other day       Allergies  Allergen Reactions  . Aspirin Itching   Follow-up Information   Follow up with Joshua Maxin, DO.   Specialty:  Internal Medicine   Contact information:   [place] [address] [phone]       Follow up with Joshua Maxin, DO.   Specialty:  Internal Medicine   Contact  information:   [place] [address] [phone]       Follow up with Ulla Potash., MD.   Specialty:  Nephrology   Contact information:   [place] [address] [phone]        The results of significant diagnostics from this hospitalization (including imaging, microbiology, ancillary and laboratory) are listed below for reference.  Significant Diagnostic Studies: Ct Abdomen Pelvis Wo Contrast  01/30/2014   CLINICAL DATA:  Cardiac arrest.  EXAM: CT CHEST, ABDOMEN AND PELVIS WITHOUT CONTRAST  TECHNIQUE: Multidetector CT imaging of the chest, abdomen and pelvis was performed following the standard protocol without IV contrast.  COMPARISON:  None.  CT CHEST W/O CM dated 01/07/2014; DG CHEST 1 VIEW dated 01/30/2014  FINDINGS: CT CHEST FINDINGS  The endotracheal tube extends into the right mainstem bronchus. This was immediately called to Dr. Dina Rich on 01/30/2014 at 3 p.m. Nasogastric tube extends into the abdomen. There appears to be prominent mediastinal lymph nodes which are similar to the previous examination but poorly characterized due to the lack of contrast and motion. There are small bilateral pleural effusions, left side greater than right. No significant pericardial fluid. There is diffuse subcutaneous edema. There is compressive atelectasis in lower lobes bilaterally. Patchy densities in left upper lung. Airspace densities within the right middle lobe. There are subtle densities in the right upper lung. No evidence for a large pneumothorax. No acute bone abnormality.  CT ABDOMEN AND PELVIS FINDINGS  There is no evidence for free air. Nasogastric tube extends into the distal stomach. There is diffuse subcutaneous edema. There is a small amount of abdominal ascites. Unenhanced CT was performed per clinician order. Lack of IV contrast limits sensitivity and specificity, especially for evaluation of abdominal/pelvic solid viscera.  Gallbladder may be mildly distended but poorly characterized. No gross abnormality to the liver or spleen. Limited evaluation of the pancreas and adrenal glands. No gross abnormality to the kidneys. There are prominent small bowel loops in the left abdomen with mesenteric edema. Bowel wall thickening, particularly on sequence 2, image 86. Fluid in the urinary bladder. No acute bone abnormality.  IMPRESSION: The study is very limited due to the lack of contrast and motion.  Prominent small bowel loops in the left abdomen with mesenteric edema. There is concern for bowel wall thickening in this area. Based on the history of cardiac arrest, ischemic bowel is in the differential diagnosis. Findings could also be related to an infectious enteritis.  Bilateral small pleural effusions with patchy parenchymal lung densities. Findings could represent atelectasis and asymmetric edema. Infection cannot excluded, particularly in the right middle lobe.  Endotracheal tube in the right mainstem bronchus as described.  Small amount of abdominal ascites and diffuse subcutaneous edema. Findings are concerning for anasarca.  Mild distention of the gallbladder.   Electronically Signed   By: Markus Daft M.D.   On: 01/30/2014 15:18   Dg Chest 1 View  01/30/2014   CLINICAL DATA:  Status post cardiac arrest ; assess endotracheal tube and nasogastric tube position  EXAM: CHEST - 1 VIEW  COMPARISON:  CT CHEST W/O CM dated 01/30/2014  FINDINGS: The trachea has undergone interval intubation. The tip of the ET tube lies in the proximal right mainstem bronchus. The lung volumes are reasonably well maintained. There are coarse interstitial markings bilaterally with near confluence at the right lung base.  The esophagogastric tube tip and proximal port project below the inferior margin of the film. The cardiac silhouette is mildly enlarged. The pulmonary vascularity is not engorged. External pacemaker pads are present.  IMPRESSION: 1. The  endotracheal tube tip is at the margin of the right main stem bronchus and withdrawal by 5 cm is recommended. 2. The esophagogastric tube appears to be in appropriate position. 3. The pulmonary interstitial markings are mildly increased bilaterally especially on the right. Confluent density at the  right lung base suggests atelectasis or pneumonia. 4. These results were called by telephone at the time of interpretation on 01/30/2014 at 2:55 PM to Doylene Canard, RN,, who verbally acknowledged these results.   Electronically Signed   By: David  Martinique   On: 01/30/2014 14:56   Dg Chest 2 View  01/26/2014   CLINICAL DATA:  Shortness of breath, lower extremity edema  EXAM: CHEST  2 VIEW  COMPARISON:  01/20/2014  FINDINGS: Cardiomegaly again noted. Bilateral small pleural effusion with bilateral basilar atelectasis or infiltrate. No pulmonary edema. Mild degenerative changes thoracic spine.  IMPRESSION: Cardiomegaly. Bilateral small pleural effusion with bilateral basilar atelectasis or infiltrate.   Electronically Signed   By: Lahoma Crocker M.D.   On: 01/26/2014 11:35   Dg Chest 2 View  01/20/2014   CLINICAL DATA:  Followup right lung volume loss. Shortness of breath.  EXAM: CHEST  2 VIEW  COMPARISON:  01/08/2014 and 01/07/2014  FINDINGS: Interval removal of nasogastric tube, right subclavian central venous catheter and endotracheal tubes. Lungs are hypoinflated with continued evidence of a small left pleural effusion likely with associated atelectasis in the left base. There is slightly better aeration in the left base compared to the previous exam. There is slight worsening of a small right pleural effusion likely with associated atelectasis. Stable cardiomegaly. Remainder the exam is unchanged.  IMPRESSION: Mild worsening of a small right pleural effusion likely with associated right basilar atelectasis. Persistent small left pleural effusion without significant change. Persistent opacification in the left base with  slightly better aeration which may be due to atelectasis or infection.   Electronically Signed   By: Marin Olp M.D.   On: 01/20/2014 12:18   Dg Abd 1 View  01/30/2014   CLINICAL DATA:  Abdominal tenderness and loose stools.  EXAM: ABDOMEN - 1 VIEW  COMPARISON:  DG ABD PORTABLE 1V dated 01/07/2014  FINDINGS: The bowel gas pattern is normal. No obstruction or significant ileus is visualized. No abnormal calcifications or soft tissue abnormalities are identified. Mild degenerative changes are present of the lower lumbar spine.  IMPRESSION: Unremarkable abdominal radiograph.   Electronically Signed   By: Aletta Edouard M.D.   On: 01/30/2014 14:24   Ct Head Wo Contrast  01/30/2014   CLINICAL DATA:  Status post cardiac arrest, query intracranial hemorrhage.  EXAM: CT HEAD WITHOUT CONTRAST  TECHNIQUE: Contiguous axial images were obtained from the base of the skull through the vertex without intravenous contrast.  COMPARISON:  CT HEAD W/O CM dated 01/07/2014  FINDINGS: The brainstem, cerebellum, cerebral peduncles, thalamus, basal ganglia, basilar cisterns, and ventricular system appear within normal limits. Periventricular white matter and corona radiata hypodensities favor chronic ischemic microvascular white matter disease. No intracranial hemorrhage, mass lesion, or acute CVA. Mucosal thickening in the nasal cavity noted. The mild chronic maxillary, ethmoid, and left sphenoid sinusitis.  IMPRESSION: 1. No intracranial hemorrhage, findings of acute CVA, or other acute intracranial abnormality. 2. Chronic paranasal sinusitis with mucosal swelling and frothy fluid in the nasal cavity.   Electronically Signed   By: Sherryl Barters M.D.   On: 01/30/2014 16:04   Ct Chest Wo Contrast  01/30/2014   CLINICAL DATA:  Cardiac arrest.  EXAM: CT CHEST, ABDOMEN AND PELVIS WITHOUT CONTRAST  TECHNIQUE: Multidetector CT imaging of the chest, abdomen and pelvis was performed following the standard protocol without IV contrast.   COMPARISON:  None.  CT CHEST W/O CM dated 01/07/2014; DG CHEST 1 VIEW dated 01/30/2014  FINDINGS: CT  CHEST FINDINGS  The endotracheal tube extends into the right mainstem bronchus. This was immediately called to Dr. Dina Rich on 01/30/2014 at 3 p.m. Nasogastric tube extends into the abdomen. There appears to be prominent mediastinal lymph nodes which are similar to the previous examination but poorly characterized due to the lack of contrast and motion. There are small bilateral pleural effusions, left side greater than right. No significant pericardial fluid. There is diffuse subcutaneous edema. There is compressive atelectasis in lower lobes bilaterally. Patchy densities in left upper lung. Airspace densities within the right middle lobe. There are subtle densities in the right upper lung. No evidence for a large pneumothorax. No acute bone abnormality.  CT ABDOMEN AND PELVIS FINDINGS  There is no evidence for free air. Nasogastric tube extends into the distal stomach. There is diffuse subcutaneous edema. There is a small amount of abdominal ascites. Unenhanced CT was performed per clinician order. Lack of IV contrast limits sensitivity and specificity, especially for evaluation of abdominal/pelvic solid viscera. Gallbladder may be mildly distended but poorly characterized. No gross abnormality to the liver or spleen. Limited evaluation of the pancreas and adrenal glands. No gross abnormality to the kidneys. There are prominent small bowel loops in the left abdomen with mesenteric edema. Bowel wall thickening, particularly on sequence 2, image 86. Fluid in the urinary bladder. No acute bone abnormality.  IMPRESSION: The study is very limited due to the lack of contrast and motion.  Prominent small bowel loops in the left abdomen with mesenteric edema. There is concern for bowel wall thickening in this area. Based on the history of cardiac arrest, ischemic bowel is in the differential diagnosis. Findings could also be  related to an infectious enteritis.  Bilateral small pleural effusions with patchy parenchymal lung densities. Findings could represent atelectasis and asymmetric edema. Infection cannot excluded, particularly in the right middle lobe.  Endotracheal tube in the right mainstem bronchus as described.  Small amount of abdominal ascites and diffuse subcutaneous edema. Findings are concerning for anasarca.  Mild distention of the gallbladder.   Electronically Signed   By: Markus Daft M.D.   On: 01/30/2014 15:18   Nm Hepatobiliary  01/12/2014   CLINICAL DATA:  Upper abdominal pain  EXAM: NUCLEAR MEDICINE HEPATOBILIARY IMAGING  Views:  Anterior right upper quadrant  Radionuclide:  Technetium 52m Choletec  Dose:  5.0 mCi  Route of administration: Intravenous  COMPARISON:  None.  FINDINGS: Liver uptake of radiotracer is normal. There is prompt visualization of gallbladder and small bowel, indicating patency of the cystic and common bile ducts.  IMPRESSION: Study within normal limits.   Electronically Signed   By: Lowella Grip M.D.   On: 01/12/2014 13:14   US Renal  01/14/2014   CLINICAL DATA:  Acute kidney injury.  Renal insufficiency.  EXAM: RENAL/URINARY TRACT ULTRASOUND COMPLETE  COMPARISON:  None.  FINDINGS: Right Kidney:  Length: 11.6 cm. Diffusely increased parenchymal echogenicity seen, consistent with medical renal disease. No mass or hydronephrosis visualized.  Left Kidney:  Length: 11.9 cm. Diffusely increased parenchymal echogenicity seen, consistent with medical renal disease. No mass or hydronephrosis visualized. Left pleural effusion also noted.  Bladder:  Empty with Foley catheter in place.  IMPRESSION: Normal renal size with increased parenchymal echogenicity, consistent with medical renal disease. No evidence of hydronephrosis.  Left pleural effusion incidentally noted.   Electronically Signed   By: Earle Gell M.D.   On: 01/14/2014 21:09   Nm Pulmonary Perf And Vent  02/01/2014   CLINICAL  DATA  Shortness of breath, evaluate for pulmonary embolism  EXAM NUCLEAR MEDICINE VENTILATION - PERFUSION LUNG SCAN  TECHNIQUE Ventilation images were obtained in multiple projections using inhaled aerosol technetium 99 M DTPA. Perfusion images were obtained in multiple projections after intravenous injection of Tc-65m MAA.  RADIOPHARMACEUTICALS 40 mCi Tc-20m DTPA aerosol and 6 mCi Tc-72m MAA  COMPARISON NM PULMONARY VENT & PERF dated 01/09/2014; DG CHEST 1V PORT dated 01/30/2014; DG CHEST 1 VIEW dated 01/30/2014; DG CHEST 2 VIEW dated 01/26/2014  FINDINGS Review of chest radiograph performed 01/30/2014 demonstrates an enlarged cardiac silhouette and mediastinal contours. Stable position of support apparatus. No pneumothorax. External pacing pads overlie left ventricular apex. The pulmonary vasculature is indistinct with cephalization of flow. Grossly unchanged perihilar and bilateral medial basilar opacities. Unchanged small bilateral effusions. No definite pneumothorax.  Ventilation: Review the ventilatory images demonstrates clumping of inhaled radiotracer about the bilateral pulmonary hila, left greater than right. Inhaled radiotracer is seen layering within the left mainstem bronchus. There is overall poor ventilation of the bilateral lungs. A minimal amount of radiotracer is seen within the hypopharynx.  Perfusion: There is relative homogeneous distribution of injected radiotracer throughout the bilateral pulmonary parenchyma. There is minimal oligemia about the right major fissure and blunting of the left costophrenic angle, both of which are unchanged and likely secondary to the patient's known small bilateral pleural effusions. No discrete mismatched segmental or subsegmental filling defects.  IMPRESSION Pulmonary embolism absent (very low probability for pulmonary embolism).  SIGNATURE  Electronically Signed   By: Sandi Mariscal M.D.   On: 02/01/2014 16:09   US Abdomen Port  01/31/2014   CLINICAL DATA:  Elevated liver  function tests.  EXAM: ULTRASOUND PORTABLE ABDOMEN  COMPARISON:  CT of the abdomen and pelvis 01/30/2014. Abdominal ultrasound 01/08/2014.  FINDINGS: Gallbladder:  No gallstones. Gallbladder is only partially distended. Gallbladder wall thickness is slightly increased at 4.4 mm (likely a reflection of under distention). No pericholecystic fluid. Per report from the sonographer, the patient did not exhibit a sonographic Murphy's sign on examination.  Common bile duct:  Diameter: Normal in caliber measuring 5.6 mm in the porta hepatis.  Liver:  No focal lesion identified. Within normal limits in parenchymal echogenicity. No intrahepatic biliary ductal dilatation.  IVC:  No abnormality visualized.  Pancreas:  Visualized portion unremarkable.  Spleen:  Size and appearance within normal limits. Measures 5.5 cm in length.  Right Kidney:  Length: 12.5 cm. Echogenicity within normal limits. No mass or hydronephrosis visualized.  Left Kidney:  Length: 12.4 cm. Echogenicity within normal limits. No mass or hydronephrosis visualized.  Abdominal aorta:  Largely obscured by overlying bowel gas. Measures up to 2.5 cm in diameter proximally.  Other findings:  Small volume of ascites.  Bilateral pleural effusions.  IMPRESSION: 1. No definite acute findings to account for the patient's symptoms. 2. Although the gallbladder wall appears mildly thickened at 4.4 mm, this is nonspecific and likely related to under distention of the gallbladder on today's examination, as well as a generalized state of anasarca given the presence of ascites and pleural effusions. No definite findings to suggest acute cholecystitis are noted at this time. Additionally, there is no intra or extrahepatic biliary ductal dilatation.   Electronically Signed   By: Vinnie Langton M.D.   On: 01/31/2014 07:18   Dg Chest Port 1 View  02/06/2014   CLINICAL DATA:  Increasing oxygen requirement  EXAM: PORTABLE CHEST - 1 VIEW  COMPARISON:  Prior chest x-ray  02/02/2014  FINDINGS: Persistently low inspiratory volumes with bibasilar atelectasis. Overall, the pulmonary aeration has improved compared to the prior radiograph likely secondary to a decrease in interstitial edema. There are persistent left slightly larger than right small layering pleural effusions. The cardiac and mediastinal contours are unchanged. No pneumothorax. No acute osseous abnormality.  IMPRESSION: 1. Overall, improved pulmonary aeration compared to 02/02/2014 likely from an interval decrease in interstitial pulmonary edema. 2. Persistently low inspiratory volumes with small bilateral pleural effusions and bibasilar atelectasis. Superimposed infiltrate is difficult to exclude radiographically.   Electronically Signed   By: Jacqulynn Cadet M.D.   On: 02/06/2014 09:17   Dg Chest Port 1 View  02/02/2014   CLINICAL DATA Dyspnea  EXAM PORTABLE CHEST - 1 VIEW  COMPARISON 01/30/2014  FINDINGS Cardiomediastinal contours are mildly prominent. Central vascular congestion. Interstitial prominence and patchy bibasilar opacities. Small effusions not excluded. No pneumothorax. Multilevel degenerative change. Multiple wires/tubes are presumably external.  IMPRESSION Hypoaeration. Central vascular congestion and interstitial and hazy airspace opacities may reflect edema (favored) versus multifocal infection.  Small effusions and bibasilar opacities, favor atelectasis.  SIGNATURE  Electronically Signed   By: Carlos Levering M.D.   On: 02/02/2014 22:59   Dg Chest Port 1 View  01/30/2014   CLINICAL DATA:  Central line placement.  EXAM: PORTABLE CHEST - 1 VIEW  COMPARISON:  01/30/2014 1431 hr  FINDINGS: Right internal jugular central venous line has its tip in the lower superior vena cava. There is no pneumothorax.  Endotracheal tube has been retracted. Tip now lies 3.7 cm above the chronic, well positioned.  Nasogastric tube is stable passing well into the stomach. Lung base opacity is also unchanged.   IMPRESSION: 1. Right internal jugular central venous line tip in the lower superior vena cava. No pneumothorax. 2. Endotracheal tube well positioned.   Electronically Signed   By: Lajean Manes M.D.   On: 01/30/2014 21:01    Microbiology: Recent Results (from the past 240 hour(s))  CLOSTRIDIUM DIFFICILE BY PCR     Status: Abnormal   Collection Time    02/10/14  9:39 AM      Result Value Ref Range Status   C difficile by pcr POSITIVE (*) NEGATIVE Final   Comment: CRITICAL RESULT CALLED TO, READ BACK BY AND VERIFIED WITH:     Bayard Beaver RN 11:35 02/10/14 ([email or web address])     Labs: Basic Metabolic Panel:  Recent Labs Lab 02/07/14 1445  02/10/14 0825 02/11/14 0618 02/12/14 0322 02/13/14 0330 02/14/14 0445  NA  --   < > 137 138 137 139 138  K  --   < > 3.2* 3.3* 3.9 3.8 3.6*  CL  --   < > 91* 93* 94* 97 97  CO2  --   < > 35* 36* 34* 33* 31  GLUCOSE  --   < > 122* 106* 126* 134* 146*  BUN  --   < > 35* 38* 41* 38* 34*  CREATININE  --   < > 1.20 1.39* 1.43* 1.21 1.08  CALCIUM  --   < > 8.8 8.7 8.4 8.4 8.7  MG  --   < > 1.6 1.8 1.8 1.8 1.8  PHOS 3.6  --   --   --   --   --   --   < > = values in this interval not displayed. Liver Function Tests:  Recent Labs Lab 02/08/14 0502 02/09/14 0400 02/10/14 0825  AST 22 27 23   ALT 25 27 23  ALKPHOS 292* 291* 269*  BILITOT 0.3 0.3 0.3  PROT 6.5 6.7 6.6  ALBUMIN 1.9* 2.0* 2.0*   No results found for this basename: LIPASE, AMYLASE,  in the last 168 hours No results found for this basename: AMMONIA,  in the last 168 hours CBC:  Recent Labs Lab 02/10/14 0825 02/11/14 0618 02/12/14 0322 02/13/14 0330 02/14/14 0445  WBC 14.7* 9.6 8.9 10.0 10.8*  NEUTROABS 12.1* 6.8 6.1 6.9 7.4  HGB 8.2* 8.2* 7.8* 7.6* 9.1*  HCT 26.1* 26.4* 25.0* 25.0* 28.8*  MCV 77.9* 78.3 79.6 80.1 77.8*  PLT 279 270 276 317 342   Cardiac Enzymes: No results found for this basename: CKTOTAL, CKMB, CKMBINDEX, TROPONINI,  in the last 168 hours BNP: BNP (last 3  results)  Recent Labs  01/30/14 2028  PROBNP 9165.0*   CBG:  Recent Labs Lab 02/13/14 0733 02/13/14 1138 02/13/14 1645 02/13/14 2105 02/14/14 0749  GLUCAP 122* 181* 212* 164* 136*       Signed:  Regalado, Belkys A  [facility] 02/14/2014, 8:50 AM

## 2014-02-14 NOTE — Progress Notes (Signed)
DC orders received.  Patient stable with no S/S of distress.  Medication and discharge information reviewed with patient and report called and given to [facility].  Patient to be DC to SNF via [facility]. [place], Ardeth Sportsman

## 2014-02-15 LAB — GLUCOSE, CAPILLARY
GLUCOSE-CAPILLARY: 244 mg/dL — AB (ref 70–99)
GLUCOSE-CAPILLARY: 195 mg/dL — AB (ref 70–99)
Glucose-Capillary: 143 mg/dL — ABNORMAL HIGH (ref 70–99)
Glucose-Capillary: 305 mg/dL — ABNORMAL HIGH (ref 70–99)

## 2014-02-16 ENCOUNTER — Non-Acute Institutional Stay (SKILLED_NURSING_FACILITY): Payer: PRIVATE HEALTH INSURANCE | Admitting: Internal Medicine

## 2014-02-16 DIAGNOSIS — I5033 Acute on chronic diastolic (congestive) heart failure: Secondary | ICD-10-CM

## 2014-02-16 DIAGNOSIS — E1129 Type 2 diabetes mellitus with other diabetic kidney complication: Secondary | ICD-10-CM

## 2014-02-16 DIAGNOSIS — L97509 Non-pressure chronic ulcer of other part of unspecified foot with unspecified severity: Secondary | ICD-10-CM

## 2014-02-16 DIAGNOSIS — L97529 Non-pressure chronic ulcer of other part of left foot with unspecified severity: Secondary | ICD-10-CM

## 2014-02-16 LAB — GLUCOSE, CAPILLARY
GLUCOSE-CAPILLARY: 128 mg/dL — AB (ref 70–99)
GLUCOSE-CAPILLARY: 193 mg/dL — AB (ref 70–99)
GLUCOSE-CAPILLARY: 175 mg/dL — AB (ref 70–99)

## 2014-02-17 LAB — GLUCOSE, CAPILLARY
GLUCOSE-CAPILLARY: 150 mg/dL — AB (ref 70–99)
Glucose-Capillary: 254 mg/dL — ABNORMAL HIGH (ref 70–99)
Glucose-Capillary: 252 mg/dL — ABNORMAL HIGH (ref 70–99)
Glucose-Capillary: 300 mg/dL — ABNORMAL HIGH (ref 70–99)

## 2014-02-18 LAB — GLUCOSE, CAPILLARY
GLUCOSE-CAPILLARY: 138 mg/dL — AB (ref 70–99)
GLUCOSE-CAPILLARY: 162 mg/dL — AB (ref 70–99)
GLUCOSE-CAPILLARY: 149 mg/dL — AB (ref 70–99)
GLUCOSE-CAPILLARY: 158 mg/dL — AB (ref 70–99)
Glucose-Capillary: 156 mg/dL — ABNORMAL HIGH (ref 70–99)

## 2014-02-19 LAB — GLUCOSE, CAPILLARY
GLUCOSE-CAPILLARY: 185 mg/dL — AB (ref 70–99)
GLUCOSE-CAPILLARY: 298 mg/dL — AB (ref 70–99)
GLUCOSE-CAPILLARY: 223 mg/dL — AB (ref 70–99)
Glucose-Capillary: 121 mg/dL — ABNORMAL HIGH (ref 70–99)

## 2014-02-20 LAB — GLUCOSE, CAPILLARY
Glucose-Capillary: 199 mg/dL — ABNORMAL HIGH (ref 70–99)
Glucose-Capillary: 175 mg/dL — ABNORMAL HIGH (ref 70–99)
Glucose-Capillary: 339 mg/dL — ABNORMAL HIGH (ref 70–99)
Glucose-Capillary: 270 mg/dL — ABNORMAL HIGH (ref 70–99)

## 2014-02-21 LAB — GLUCOSE, CAPILLARY
GLUCOSE-CAPILLARY: 137 mg/dL — AB (ref 70–99)
GLUCOSE-CAPILLARY: 235 mg/dL — AB (ref 70–99)
GLUCOSE-CAPILLARY: 212 mg/dL — AB (ref 70–99)
Glucose-Capillary: 248 mg/dL — ABNORMAL HIGH (ref 70–99)

## 2014-02-22 LAB — GLUCOSE, CAPILLARY
GLUCOSE-CAPILLARY: 222 mg/dL — AB (ref 70–99)
GLUCOSE-CAPILLARY: 290 mg/dL — AB (ref 70–99)
Glucose-Capillary: 167 mg/dL — ABNORMAL HIGH (ref 70–99)
Glucose-Capillary: 214 mg/dL — ABNORMAL HIGH (ref 70–99)

## 2014-02-22 NOTE — Progress Notes (Signed)
Patient ID: Joshua [name], male   DOB: 1951-08-10, 63 y.o.   MRN: [id number]                  HISTORY & PHYSICAL  DATE:  02/16/2014    FACILITY: [facility]    LEVEL OF CARE:   SNF   HISTORY OF PRESENT ILLNESS:  Joshua [name] is a 63 year-old man who I initially had in the facility after a stay at [facility] from 01/06/2014 through 01/21/2014 at which time he had an infection in his left foot.  He underwent an I&D and antibiotic therapy.   His other medical issues included type 2 diabetes, diastolic heart failure, hypoalbuminemia, chronic renal insufficiency and possibly nephrotic syndrome.  He was also noted to have acute acalculous cholecystitis during his hospitalization.    The patient was  apparently diagnosed with C.difficile while he was here.  He was going over for an abdominal x-ray and became asystolic.  He underwent 14 minutes of ACLS, was transferred to [facility].  I believe he underwent a left heart catheterization.  His echocardiogram showed a left ventricular ejection fraction of 55-60%, trivial aortic regurgitation.  As far as I am able to easily discern, he was taken off carvedilol and verapamil.  He has no arrhythmias, no EKG changes.    PAST MEDICAL HISTORY/PROBLEM LIST:   Other significant medical issues:    Acute-on-chronic diastolic heart failure.  Lasix was changed to 40 b.i.d.    Recurrent C.diff.  He was treated with oral vancomycin.    Left foot wound, which  apparently has remained very stable.    Nephrotic syndrome with a protein/creatinine ratio of 3.67.  He received IV Lasix, then transitioned to oral.  His creatinine was in the 1.2 to 1.4 range.    Acute respiratory failure, felt to be secondary to diastolic heart failure.    Tachy-brady syndrome.    Hypertension.    Seizure.  I am not sure of the exact outcome of this.    Type 2 diabetes with retinopathy, neuropathy, and nephropathy.    Felt to have some suicidal ideation.  Was seen by  Psychiatry.  Not felt to be at imminent risk.  He was started on duloxetine 10 mg a day.    DVT of the popliteal vein.  He is on Coumadin.    CURRENT MEDICATIONS:  Discharge medications include:     Clonidine 0.1 three times daily.    Aranesp 100 mcg every Wednesday.    Ferrous sulfate 325 three times a day.    Prozac 10 q.d.    Lasix 40 b.i.d.    Hydralazine 50 q.8.    NovoLog 2 U every morning at breakfast, 4 U at lunch and dinner.    DuoNebs every 4 hours p.r.n.    Imdur 30 q.d.    Vancomycin 125 by mouth four times daily.    Coumadin 5/7.5 every other day.    SOCIAL HISTORY:   HOUSING:  This patient was living on his own in [place], as I remember.   EMPLOYMENT HISTORY:  He was still working at the time.    CHILDREN:  He has children, although they are not local.   FUNCTIONAL STATUS:  Right now, it is difficult to see him functioning independently anytime in the near future.    REVIEW OF SYSTEMS:   CHEST/RESPIRATORY:  He is not complaining of cough or shortness of breath.   CARDIAC:   No chest pain.  GI:  Complaining of abdominal distention but no pain, nausea or vomiting.    MUSCULOSKELETAL:  Extremities:  He complains of generalized weakness, pain in his right knee/knee cap.    PHYSICAL EXAMINATION:   GENERAL APPEARANCE:  The patient looks surprisingly well for what he has been through.   CHEST/RESPIRATORY:  His lungs are completely clear.   CARDIOVASCULAR:  CARDIAC:   Heart sounds are normal.  There are no murmurs.  No gallops.  His JVP is not elevated.  He has widespread edema up into the back of his thighs.  He probably has ascites, 3-4+ coccyx edema.   GASTROINTESTINAL:  ABDOMEN:   Distended.  There is shifting dullness.  He had an ultrasound in the hospital on 01/31/2014.  This documents the presence of ascites, but did not suggest findings of acute cholecystitis.  His gallbladder was thickened.   GENITOURINARY:  BLADDER:   No bladder distention.  No CVA  tenderness.   CIRCULATION:  EDEMA/VARICOSITIES:  Extremities:  As noted, widespread edema.  Probably some degree of venous stasis.   SKIN:  INSPECTION:  The wounds on his right foot really look good.  The plantar wound is very superficial.  The one on the top has some surface slough and some depth.  I think Santyl under moist gauze is the way to go here.   NEUROLOGICAL:    DEEP TENDON REFLEXES:  He is hyporeflexic in the lower extremities.   SENSATION/STRENGTH:  He barely has antigravity strength in the proximal legs, 4/5 hip abductor.  There is weakness in knee extensors, as well.    ASSESSMENT/PLAN:  Widespread complications of type 2 diabetes including neuropathy, retinopathy, nephropathy with nephrotic syndrome.    Widespread edema, including ascites.  Secondary to diastolic  heart failure in combination with nephrotic syndrome.    Original wounds from his surgery on his right foot appear to be remarkably improved.    DVT of the popliteal vein.  On Coumadin.  I am not exactly sure when this dates to.    Recurrent Pseudomembranous colitis.  Completing vancomycin.  He will need to have two weeks of treatment here.  I am not exactly sure when the vancomycin started.    New onset seizure.  I am not exactly certain where this is coming from.  He is not currently on any seizure medications.    Suicidal ideation.  I really see none of this.  The patient is pleasant and cooperative.  No current evidence of severe depression.    This man will need follow-up from many angles.  He will need aggressive physical therapy.  I am doubtful he could support his own weight at this point.    There are some of his medical issues per the discharge summary here that need follow-up including DVT of the popliteal vein (?duration of Coumadin), pulmonary tuberculosis, new onset seizure.  I will try at some point to follow up on these individually.    CPT CODE: 60454

## 2014-02-23 LAB — GLUCOSE, CAPILLARY
GLUCOSE-CAPILLARY: 222 mg/dL — AB (ref 70–99)
Glucose-Capillary: 140 mg/dL — ABNORMAL HIGH (ref 70–99)
Glucose-Capillary: 237 mg/dL — ABNORMAL HIGH (ref 70–99)
Glucose-Capillary: 152 mg/dL — ABNORMAL HIGH (ref 70–99)

## 2014-02-24 ENCOUNTER — Encounter: Payer: PRIVATE HEALTH INSURANCE | Admitting: Internal Medicine

## 2014-02-24 ENCOUNTER — Ambulatory Visit (INDEPENDENT_AMBULATORY_CARE_PROVIDER_SITE_OTHER): Payer: PRIVATE HEALTH INSURANCE | Admitting: Internal Medicine

## 2014-02-24 ENCOUNTER — Ambulatory Visit: Payer: PRIVATE HEALTH INSURANCE | Admitting: Internal Medicine

## 2014-02-24 ENCOUNTER — Encounter: Payer: Self-pay | Admitting: Internal Medicine

## 2014-02-24 VITALS — BP 164/86 | HR 76 | Temp 97.1°F

## 2014-02-24 DIAGNOSIS — I10 Essential (primary) hypertension: Secondary | ICD-10-CM

## 2014-02-24 DIAGNOSIS — A0472 Enterocolitis due to Clostridium difficile, not specified as recurrent: Secondary | ICD-10-CM

## 2014-02-24 DIAGNOSIS — E1129 Type 2 diabetes mellitus with other diabetic kidney complication: Secondary | ICD-10-CM

## 2014-02-24 DIAGNOSIS — Z7901 Long term (current) use of anticoagulants: Secondary | ICD-10-CM

## 2014-02-24 DIAGNOSIS — I509 Heart failure, unspecified: Secondary | ICD-10-CM

## 2014-02-24 DIAGNOSIS — L97509 Non-pressure chronic ulcer of other part of unspecified foot with unspecified severity: Secondary | ICD-10-CM

## 2014-02-24 DIAGNOSIS — J9600 Acute respiratory failure, unspecified whether with hypoxia or hypercapnia: Secondary | ICD-10-CM

## 2014-02-24 DIAGNOSIS — N058 Unspecified nephritic syndrome with other morphologic changes: Secondary | ICD-10-CM

## 2014-02-24 DIAGNOSIS — K922 Gastrointestinal hemorrhage, unspecified: Secondary | ICD-10-CM

## 2014-02-24 DIAGNOSIS — L97529 Non-pressure chronic ulcer of other part of left foot with unspecified severity: Secondary | ICD-10-CM

## 2014-02-24 DIAGNOSIS — R45851 Suicidal ideations: Secondary | ICD-10-CM

## 2014-02-24 DIAGNOSIS — I5032 Chronic diastolic (congestive) heart failure: Secondary | ICD-10-CM

## 2014-02-24 DIAGNOSIS — I82439 Acute embolism and thrombosis of unspecified popliteal vein: Secondary | ICD-10-CM

## 2014-02-24 DIAGNOSIS — E1165 Type 2 diabetes mellitus with hyperglycemia: Secondary | ICD-10-CM

## 2014-02-24 LAB — CBC WITH DIFFERENTIAL/PLATELET
BASOS ABS: 0 10*3/uL (ref 0.0–0.1)
BASOS PCT: 0 % (ref 0–1)
EOS ABS: 0.2 10*3/uL (ref 0.0–0.7)
Eosinophils Relative: 2 % (ref 0–5)
HCT: 28.8 % — ABNORMAL LOW (ref 39.0–52.0)
Hemoglobin: 9.2 g/dL — ABNORMAL LOW (ref 13.0–17.0)
Lymphocytes Relative: 15 % (ref 12–46)
Lymphs Abs: 1.1 10*3/uL (ref 0.7–4.0)
MCH: 25.1 pg — AB (ref 26.0–34.0)
MCHC: 31.9 g/dL (ref 30.0–36.0)
MCV: 78.5 fL (ref 78.0–100.0)
Monocytes Absolute: 0.6 10*3/uL (ref 0.1–1.0)
Monocytes Relative: 8 % (ref 3–12)
Neutro Abs: 5.6 10*3/uL (ref 1.7–7.7)
Neutrophils Relative %: 75 % (ref 43–77)
PLATELETS: 398 10*3/uL (ref 150–400)
RBC: 3.67 MIL/uL — ABNORMAL LOW (ref 4.22–5.81)
RDW: 22.8 % — AB (ref 11.5–15.5)
WBC: 7.5 10*3/uL (ref 4.0–10.5)

## 2014-02-24 LAB — COMPLETE METABOLIC PANEL WITH GFR
ALBUMIN: 2 g/dL — AB (ref 3.5–5.2)
ALT: 53 U/L (ref 0–53)
AST: 46 U/L — AB (ref 0–37)
Alkaline Phosphatase: 606 U/L — ABNORMAL HIGH (ref 39–117)
BUN: 40 mg/dL — AB (ref 6–23)
CALCIUM: 8.9 mg/dL (ref 8.4–10.5)
CHLORIDE: 98 meq/L (ref 96–112)
CO2: 28 meq/L (ref 19–32)
Creat: 1.07 mg/dL (ref 0.50–1.35)
GFR, EST AFRICAN AMERICAN: 86 mL/min
GFR, Est Non African American: 74 mL/min
Glucose, Bld: 179 mg/dL — ABNORMAL HIGH (ref 70–99)
POTASSIUM: 4.3 meq/L (ref 3.5–5.3)
Sodium: 137 mEq/L (ref 135–145)
Total Bilirubin: 0.5 mg/dL (ref 0.3–1.2)
Total Protein: 7 g/dL (ref 6.0–8.3)

## 2014-02-24 LAB — GLUCOSE, CAPILLARY
GLUCOSE-CAPILLARY: 148 mg/dL — AB (ref 70–99)
GLUCOSE-CAPILLARY: 238 mg/dL — AB (ref 70–99)
Glucose-Capillary: 189 mg/dL — ABNORMAL HIGH (ref 70–99)
Glucose-Capillary: 164 mg/dL — ABNORMAL HIGH (ref 70–99)

## 2014-02-24 LAB — POCT GLYCOSYLATED HEMOGLOBIN (HGB A1C): Hemoglobin A1C: 6.5

## 2014-02-24 LAB — POCT INR: INR: 1.9

## 2014-02-24 MED ORDER — WARFARIN SODIUM 5 MG PO TABS: 7.5000 mg | ORAL_TABLET | Freq: Every day | ORAL | Status: DC

## 2014-02-24 MED ORDER — LISINOPRIL 20 MG PO TABS: 20.0000 mg | ORAL_TABLET | Freq: Every day | ORAL | Status: DC

## 2014-02-24 NOTE — Patient Instructions (Signed)
Please have your INR checked on Monday. I have increased your coumadin to 7.5 mg daily. It will need to be adjusted by a coumadin clinic. I will call your facility this afternoon to discuss this with them.  Please restart your ACEi for your blood pressure.  Please continue to have wound care follow your diabetic foot ulcers.  Please f/u with PCP in 4-6 weeks.

## 2014-02-24 NOTE — Assessment & Plan Note (Signed)
Patient moderately hypertensive today. Restarted lisinopril at 20 mg qd. F/U in 4/6 weeks. Will need BMP at that time.

## 2014-02-24 NOTE — Assessment & Plan Note (Signed)
Patient was sub therapeutic on coumadin (1.9). Increased patients coumadin from 5-7.5 mg qd on alternating days to 7.5 mg daily. Instructed patient to follow up with coumadin clinic at facility and check INR on Monday.

## 2014-02-24 NOTE — Progress Notes (Signed)
Patient ID: Joshua [name], male   DOB: 01/21/1951, 63 y.o.   MRN: [id number]    Subjective:   Patient ID: Joshua [name] male   DOB: [birth year] 63 y.o.   MRN: [id number]  HPI: Joshua [name] is a 63 y.o. man with a pmhx detailed below who comes to the clinic for hospital f/u. He was recently hospitalized twice. His first hospitalization was due to a DFU that underwent surgical debridement and ABX treatment. Further, he was started on Warfarin for bil DVTs. He subsequently was discharged and had C Diff colitis. While undergoing an outpatient KUB he arrested and received 14 minutes of CPR. He had ROSC and was admitted to [facility] ICU. He recovered and was discharged to SNF. There was also documented SI during the hospitalization. Psych saw the patient and started the patient on Prozac.  Today, the patient has no complaints. SOB is at baseline. His feet are healing well. He denies melena and hematochezia. He denies abdominal pain. He denies diarrhea. He denies LOC and syncope.     Past Medical History  Diagnosis Date  . 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 Hypertension 10/14/2012  . Type 2 diabetes mellitus   . Diabetic nephropathy with proteinuria   . 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 Diastolic heart failure   . Cellulitis   . Convulsions   . Iron deficiency anemia   . Venous thrombosis and embolism   . Anasarca   . Hypoalbuminemia   . Elevated alkaline phosphatase level   . Microcytic anemia   . GI bleed   . Nephrotic syndrome   . Difficulty in walking(719.7)   . Muscle weakness (generalized)   . Type II or unspecified type diabetes mellitus with renal manifestations, not stated as uncontrolled   . Iron deficiency anemia, unspecified    No current outpatient prescriptions on file.   No current facility-administered medications for this visit.   No family history on file. History   Social History  . Marital Status: Single    Spouse Name: N/A   Number of Children: 5  . 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 Smoking status: Former Smoker -- 20 years    Types: Cigarettes    Quit date: 11/25/1992  . Smokeless tobacco: Never Used  . Alcohol Use: No     Comment: Rarely.  . Drug Use: No  . Sexual Activity: Not on file     Comment: 09/2012: > 15 partners in past 3 years, inconsistent condom use   Other Topics Concern  . Not on file   Social History Narrative   Has a masters degree in education   Review of Systems: Pertinent items are noted in HPI. Objective:  Physical Exam: There were no vitals filed for this visit.  Physical Exam  Constitutional: He is oriented to person, place, and time. He appears well-developed and well-nourished.  Frail appearing man on 2 L O2 by [place].  HENT:  Head: Normocephalic.  Mouth/Throat: Oropharynx is clear and moist.  Cardiovascular: Normal rate, regular rhythm, normal heart sounds and intact distal pulses.  Exam reveals no friction rub.   No murmur heard. Pulmonary/Chest: Effort normal and breath sounds normal. No respiratory distress. He has no wheezes. He has no rales.  On 2 L [place]  Abdominal: Soft. Bowel sounds are normal. He exhibits no distension. There is no tenderness. There is no rebound and  no guarding.  Musculoskeletal: He exhibits edema.  bil LE edema to the knees. Patient states that this is stable since d/c. Likely due to bil DVT.   Neurological: He is alert and oriented to person, place, and time.  Skin:  Dressing in place on left foot. Wounds on dorsal and plantar side of left foot. Appear to be healing well. No erythema or purulent drainage. Mild serosanguinous drainage on the bandage.   Psychiatric: He has a normal mood and affect. His behavior is normal.     Assessment & Plan:

## 2014-02-24 NOTE — Assessment & Plan Note (Signed)
Patient states that he continues to have depressive thoughts. He denies active SI HI. I suggested increasing his prozac dose, but he did not want to do this at this visit. Plan for f/u at future visits.

## 2014-02-24 NOTE — Assessment & Plan Note (Signed)
Patients wound appears to be improving with wound care as outpatient. Plan to continue wound care until resolution. No evidence of active infection at this time.

## 2014-02-24 NOTE — Assessment & Plan Note (Addendum)
Diarrhea resolved. No abdominal pain. Appears to have resolved. Still on vancomycin PO.

## 2014-02-25 LAB — GLUCOSE, CAPILLARY
Glucose-Capillary: 170 mg/dL — ABNORMAL HIGH (ref 70–99)
Glucose-Capillary: 273 mg/dL — ABNORMAL HIGH (ref 70–99)
Glucose-Capillary: 322 mg/dL — ABNORMAL HIGH (ref 70–99)
Glucose-Capillary: 210 mg/dL — ABNORMAL HIGH (ref 70–99)

## 2014-02-26 LAB — GLUCOSE, CAPILLARY
GLUCOSE-CAPILLARY: 181 mg/dL — AB (ref 70–99)
GLUCOSE-CAPILLARY: 211 mg/dL — AB (ref 70–99)
GLUCOSE-CAPILLARY: 198 mg/dL — AB (ref 70–99)
Glucose-Capillary: 167 mg/dL — ABNORMAL HIGH (ref 70–99)
Glucose-Capillary: 231 mg/dL — ABNORMAL HIGH (ref 70–99)

## 2014-02-27 LAB — GLUCOSE, CAPILLARY
GLUCOSE-CAPILLARY: 255 mg/dL — AB (ref 70–99)
Glucose-Capillary: 166 mg/dL — ABNORMAL HIGH (ref 70–99)
Glucose-Capillary: 216 mg/dL — ABNORMAL HIGH (ref 70–99)
Glucose-Capillary: 256 mg/dL — ABNORMAL HIGH (ref 70–99)

## 2014-02-28 ENCOUNTER — Ambulatory Visit (HOSPITAL_COMMUNITY)
Admit: 2014-02-28 | Discharge: 2014-02-28 | Disposition: A | Discharge status: Home / Self Care | Payer: PRIVATE HEALTH INSURANCE | Attending: Gastroenterology | Admitting: Gastroenterology

## 2014-02-28 DIAGNOSIS — K224 Dyskinesia of esophagus: Secondary | ICD-10-CM | POA: Insufficient documentation

## 2014-02-28 DIAGNOSIS — E119 Type 2 diabetes mellitus without complications: Secondary | ICD-10-CM | POA: Insufficient documentation

## 2014-02-28 DIAGNOSIS — I10 Essential (primary) hypertension: Secondary | ICD-10-CM | POA: Insufficient documentation

## 2014-02-28 DIAGNOSIS — R195 Other fecal abnormalities: Secondary | ICD-10-CM | POA: Insufficient documentation

## 2014-02-28 LAB — GLUCOSE, CAPILLARY
GLUCOSE-CAPILLARY: 166 mg/dL — AB (ref 70–99)
Glucose-Capillary: 186 mg/dL — ABNORMAL HIGH (ref 70–99)

## 2014-02-28 NOTE — Assessment & Plan Note (Signed)
Well controlled. No changes at this visit.

## 2014-02-28 NOTE — Progress Notes (Signed)
Clinical social worker assisted with patient discharge to skilled nursing facility, [facility].  CSW addressed all family questions and concerns. CSW copied chart and added all important documents. CSW also set up patient transportation with Diplomatic Services operational officer. Clinical Social Worker will sign off for now as social work intervention is no longer needed.   Rhea Pink, MSW, [place]

## 2014-02-28 NOTE — Assessment & Plan Note (Signed)
Patients SOB is at baseline. Fluid status is euvolemic.

## 2014-02-28 NOTE — Assessment & Plan Note (Signed)
Appears to have resolved. Patient SOB is at baseline.

## 2014-03-01 LAB — GLUCOSE, CAPILLARY
GLUCOSE-CAPILLARY: 215 mg/dL — AB (ref 70–99)
GLUCOSE-CAPILLARY: 229 mg/dL — AB (ref 70–99)
GLUCOSE-CAPILLARY: 226 mg/dL — AB (ref 70–99)
Glucose-Capillary: 301 mg/dL — ABNORMAL HIGH (ref 70–99)
Glucose-Capillary: 233 mg/dL — ABNORMAL HIGH (ref 70–99)
Glucose-Capillary: 149 mg/dL — ABNORMAL HIGH (ref 70–99)
Glucose-Capillary: 205 mg/dL — ABNORMAL HIGH (ref 70–99)

## 2014-03-02 LAB — GLUCOSE, CAPILLARY
GLUCOSE-CAPILLARY: 219 mg/dL — AB (ref 70–99)
GLUCOSE-CAPILLARY: 182 mg/dL — AB (ref 70–99)
Glucose-Capillary: 166 mg/dL — ABNORMAL HIGH (ref 70–99)
Glucose-Capillary: 278 mg/dL — ABNORMAL HIGH (ref 70–99)

## 2014-03-02 NOTE — Progress Notes (Signed)
Case discussed with Dr. Komanski at the time of the visit.  We reviewed the resident's history and exam and pertinent patient test results.  I agree with the assessment, diagnosis, and plan of care documented in the resident's note.      

## 2014-03-02 NOTE — Progress Notes (Signed)
This encounter was created in error - please disregard.

## 2014-03-03 ENCOUNTER — Non-Acute Institutional Stay (SKILLED_NURSING_FACILITY): Payer: Self-pay | Admitting: Internal Medicine

## 2014-03-03 DIAGNOSIS — D649 Anemia, unspecified: Secondary | ICD-10-CM

## 2014-03-03 DIAGNOSIS — Z7901 Long term (current) use of anticoagulants: Secondary | ICD-10-CM

## 2014-03-03 DIAGNOSIS — I5032 Chronic diastolic (congestive) heart failure: Secondary | ICD-10-CM

## 2014-03-03 DIAGNOSIS — I82439 Acute embolism and thrombosis of unspecified popliteal vein: Secondary | ICD-10-CM

## 2014-03-03 DIAGNOSIS — I824Y9 Acute embolism and thrombosis of unspecified deep veins of unspecified proximal lower extremity: Secondary | ICD-10-CM

## 2014-03-03 DIAGNOSIS — I10 Essential (primary) hypertension: Secondary | ICD-10-CM

## 2014-03-03 DIAGNOSIS — F411 Generalized anxiety disorder: Secondary | ICD-10-CM

## 2014-03-03 LAB — GLUCOSE, CAPILLARY
GLUCOSE-CAPILLARY: 186 mg/dL — AB (ref 70–99)
GLUCOSE-CAPILLARY: 237 mg/dL — AB (ref 70–99)
GLUCOSE-CAPILLARY: 256 mg/dL — AB (ref 70–99)

## 2014-03-03 NOTE — Progress Notes (Signed)
Patient ID: Joshua [name], male   DOB: 08-May-1951, 63 y.o.   MRN: [id number]   This is an acute visit.  Level care skilled.  Facility [facility].  Chief complaint-acute visit followup diastolic CHF-anxiety.  History of present illness.  Patient is a pleasant 63 year old male with quite a complicated medical history including diastolic CHF recent history CDiff a right leg which apparently is improving significantly He also recently underwent cardiac arrest-with asystole-was evaluated cardiology apparently Coreg as well as rapid male was discontinued.  He continues on Lasix 40 mg twice a day for history of diastolic CHF-it appears that his weights have been stable most recently 149.6 today which is baseline.  He does not complaining of any chest pain or shortness of breath his main complaint is some insomnia this evening he would like to get something to help him sleep.  Patient is on Prozac apparently with some history of depression and suicide idealization previously.  Family medical social history as been reviewed from previous progress notes including 02/28/2014-and 02/16/2014  Medications have been reviewed per [facility].  Review of systems.  In general denies any fever or chills.  Respiratory no complaints of chest pain.  Cardiac no chest pain he does have lower extremity edema which appears to be chronic.  GI does not complaining of any abdominal discomfort nausea or vomiting.  Muscle skeletal is not complaining of joint pain this evening  Neurologic-no complaints of headache dizziness or syncopal-type feelings.  Psych-he is complaining of insomnia  Physical exam.  He is afebrile pulse of 98 respirations of 20 blood pressure 185/97 he does have variable blood pressures ranging from the 180s to the [id number] systolically appears after he receives his medication he comes down  In general this is a fairly well-nourished elderly male in no distress sitting comfortably on the side of his  bed appears to be somewhat anxious however.  His skin is warm and dry.  Chest is clear to auscultation there is no labored breathing.  Heart is regular rate and rhythm without murmur gallop or rub he continues with 2-3+ lower extremity edema she believe his baseline.  Abdomen is soft nontender with positive bowel sounds.  Muscle skeletal is able to move all extremities x4 his baseline does have lower extremity weakness I suspect this is complicated with his edema.  Neurologic appears to be grossly intact his speech is clear.  Psych he appears alert and oriented again is complaining of insomnia appear somewhat anxious about this.  Labs. 02/28/2014.  WBC 6.3 hemoglobin 8.9 platelets 411.  INR 1.68.  02/23/2014.  WBC 6.8 hemoglobin 9.6 platelets 442.  Sodium 138 potassium 3.9 BUN 38 creatinine 1.25.  Assessment plan.  #1-diastolic CHF-at this point a very fragile individual but this appears relatively stable clinically-his weights have been stable he does not complaining of increased shortness of breath-continue to monitor he is on Lasix Will update a metabolic panel.  #2-history of DVT he is on Lovenox bridging to Coumadin INR has beensub therapeutic recently we have been titrating up his Coumadin gradually again he continues on Lovenox as well with goal to DC Lovenox when INR reaches 2--3  #3-anemia I suspect there is an element of chronic disease here he is on iron 3 times a day we'll update CBC tomorrow.  #4-insomnia -- with question anxiety-Will give him low-dose Ativan 0.25 mg tonight-have followup with psychiatric services about this about the best long-term solution--and would like to give him something to give him some relief this  evening  #5 hypertension-patient is on Catapres 0.1 mg 3 times a day hydralazine 50 mg every 8 hours as well as lisinopril 20 mg once a day--the lisinopril was recently started-- appears his systolics are somewhat elevated before he receives his  medications --apparentlystaff has just given him apparently his Catapres for the night and usually it comes down continue to monitor this he may need adjustment but apparently there is some variability here --lisinopril was restarted last week by Dr. Margart Sickles--  CPT [phone]

## 2014-03-04 ENCOUNTER — Other Ambulatory Visit: Payer: Self-pay | Admitting: *Deleted

## 2014-03-04 LAB — GLUCOSE, CAPILLARY
Glucose-Capillary: 168 mg/dL — ABNORMAL HIGH (ref 70–99)
Glucose-Capillary: 312 mg/dL — ABNORMAL HIGH (ref 70–99)
Glucose-Capillary: 244 mg/dL — ABNORMAL HIGH (ref 70–99)
Glucose-Capillary: 231 mg/dL — ABNORMAL HIGH (ref 70–99)

## 2014-03-04 MED ORDER — ZOLPIDEM TARTRATE 5 MG PO TABS: ORAL_TABLET | ORAL | Status: DC

## 2014-03-04 NOTE — Telephone Encounter (Signed)
rx faxed to [place] @ [phone]

## 2014-03-05 LAB — GLUCOSE, CAPILLARY
GLUCOSE-CAPILLARY: 170 mg/dL — AB (ref 70–99)
GLUCOSE-CAPILLARY: 366 mg/dL — AB (ref 70–99)
GLUCOSE-CAPILLARY: 332 mg/dL — AB (ref 70–99)
Glucose-Capillary: 245 mg/dL — ABNORMAL HIGH (ref 70–99)
Glucose-Capillary: 331 mg/dL — ABNORMAL HIGH (ref 70–99)

## 2014-03-06 ENCOUNTER — Encounter: Payer: Self-pay | Admitting: Internal Medicine

## 2014-03-06 DIAGNOSIS — F411 Generalized anxiety disorder: Secondary | ICD-10-CM | POA: Insufficient documentation

## 2014-03-06 LAB — GLUCOSE, CAPILLARY
Glucose-Capillary: 164 mg/dL — ABNORMAL HIGH (ref 70–99)
Glucose-Capillary: 206 mg/dL — ABNORMAL HIGH (ref 70–99)
Glucose-Capillary: 221 mg/dL — ABNORMAL HIGH (ref 70–99)
Glucose-Capillary: 241 mg/dL — ABNORMAL HIGH (ref 70–99)

## 2014-03-07 LAB — GLUCOSE, CAPILLARY
Glucose-Capillary: 185 mg/dL — ABNORMAL HIGH (ref 70–99)
Glucose-Capillary: 168 mg/dL — ABNORMAL HIGH (ref 70–99)
Glucose-Capillary: 190 mg/dL — ABNORMAL HIGH (ref 70–99)
Glucose-Capillary: 232 mg/dL — ABNORMAL HIGH (ref 70–99)

## 2014-03-08 ENCOUNTER — Ambulatory Visit (HOSPITAL_COMMUNITY)
Admit: 2014-03-08 | Discharge: 2014-03-08 | Disposition: A | Discharge status: Home / Self Care | Payer: PRIVATE HEALTH INSURANCE | Source: Ambulatory Visit | Attending: Gastroenterology | Admitting: Gastroenterology

## 2014-03-08 DIAGNOSIS — I509 Heart failure, unspecified: Secondary | ICD-10-CM | POA: Insufficient documentation

## 2014-03-08 DIAGNOSIS — E119 Type 2 diabetes mellitus without complications: Secondary | ICD-10-CM | POA: Insufficient documentation

## 2014-03-08 DIAGNOSIS — A150 Tuberculosis of lung: Secondary | ICD-10-CM | POA: Insufficient documentation

## 2014-03-08 DIAGNOSIS — R933 Abnormal findings on diagnostic imaging of other parts of digestive tract: Secondary | ICD-10-CM | POA: Insufficient documentation

## 2014-03-08 DIAGNOSIS — K921 Melena: Secondary | ICD-10-CM | POA: Insufficient documentation

## 2014-03-08 DIAGNOSIS — I10 Essential (primary) hypertension: Secondary | ICD-10-CM | POA: Insufficient documentation

## 2014-03-08 LAB — GLUCOSE, CAPILLARY
GLUCOSE-CAPILLARY: 135 mg/dL — AB (ref 70–99)
GLUCOSE-CAPILLARY: 134 mg/dL — AB (ref 70–99)
Glucose-Capillary: 265 mg/dL — ABNORMAL HIGH (ref 70–99)
Glucose-Capillary: 169 mg/dL — ABNORMAL HIGH (ref 70–99)

## 2014-03-09 LAB — GLUCOSE, CAPILLARY
GLUCOSE-CAPILLARY: 138 mg/dL — AB (ref 70–99)
Glucose-Capillary: 237 mg/dL — ABNORMAL HIGH (ref 70–99)
Glucose-Capillary: 304 mg/dL — ABNORMAL HIGH (ref 70–99)
Glucose-Capillary: 221 mg/dL — ABNORMAL HIGH (ref 70–99)

## 2014-03-10 LAB — GLUCOSE, CAPILLARY
Glucose-Capillary: 177 mg/dL — ABNORMAL HIGH (ref 70–99)
Glucose-Capillary: 305 mg/dL — ABNORMAL HIGH (ref 70–99)
Glucose-Capillary: 236 mg/dL — ABNORMAL HIGH (ref 70–99)

## 2014-03-11 LAB — GLUCOSE, CAPILLARY
Glucose-Capillary: 166 mg/dL — ABNORMAL HIGH (ref 70–99)
Glucose-Capillary: 270 mg/dL — ABNORMAL HIGH (ref 70–99)
Glucose-Capillary: 268 mg/dL — ABNORMAL HIGH (ref 70–99)
Glucose-Capillary: 279 mg/dL — ABNORMAL HIGH (ref 70–99)

## 2014-03-12 LAB — GLUCOSE, CAPILLARY
GLUCOSE-CAPILLARY: 211 mg/dL — AB (ref 70–99)
Glucose-Capillary: 172 mg/dL — ABNORMAL HIGH (ref 70–99)
Glucose-Capillary: 251 mg/dL — ABNORMAL HIGH (ref 70–99)
Glucose-Capillary: 257 mg/dL — ABNORMAL HIGH (ref 70–99)
Glucose-Capillary: 141 mg/dL — ABNORMAL HIGH (ref 70–99)
Glucose-Capillary: 260 mg/dL — ABNORMAL HIGH (ref 70–99)

## 2014-03-13 LAB — GLUCOSE, CAPILLARY
GLUCOSE-CAPILLARY: 156 mg/dL — AB (ref 70–99)
Glucose-Capillary: 251 mg/dL — ABNORMAL HIGH (ref 70–99)
Glucose-Capillary: 257 mg/dL — ABNORMAL HIGH (ref 70–99)
Glucose-Capillary: 174 mg/dL — ABNORMAL HIGH (ref 70–99)

## 2014-03-14 LAB — GLUCOSE, CAPILLARY
Glucose-Capillary: 139 mg/dL — ABNORMAL HIGH (ref 70–99)
Glucose-Capillary: 251 mg/dL — ABNORMAL HIGH (ref 70–99)
Glucose-Capillary: 198 mg/dL — ABNORMAL HIGH (ref 70–99)
Glucose-Capillary: 211 mg/dL — ABNORMAL HIGH (ref 70–99)

## 2014-03-15 ENCOUNTER — Other Ambulatory Visit (HOSPITAL_COMMUNITY): Payer: PRIVATE HEALTH INSURANCE

## 2014-03-15 LAB — GLUCOSE, CAPILLARY
GLUCOSE-CAPILLARY: 162 mg/dL — AB (ref 70–99)
GLUCOSE-CAPILLARY: 269 mg/dL — AB (ref 70–99)
GLUCOSE-CAPILLARY: 268 mg/dL — AB (ref 70–99)
GLUCOSE-CAPILLARY: 178 mg/dL — AB (ref 70–99)

## 2014-03-16 LAB — GLUCOSE, CAPILLARY
GLUCOSE-CAPILLARY: 197 mg/dL — AB (ref 70–99)
GLUCOSE-CAPILLARY: 236 mg/dL — AB (ref 70–99)
Glucose-Capillary: 161 mg/dL — ABNORMAL HIGH (ref 70–99)
Glucose-Capillary: 313 mg/dL — ABNORMAL HIGH (ref 70–99)

## 2014-03-17 ENCOUNTER — Encounter: Payer: Self-pay | Admitting: Internal Medicine

## 2014-03-17 ENCOUNTER — Non-Acute Institutional Stay (SKILLED_NURSING_FACILITY): Payer: Self-pay | Admitting: Internal Medicine

## 2014-03-17 DIAGNOSIS — E876 Hypokalemia: Secondary | ICD-10-CM

## 2014-03-17 DIAGNOSIS — A0472 Enterocolitis due to Clostridium difficile, not specified as recurrent: Secondary | ICD-10-CM

## 2014-03-17 DIAGNOSIS — I10 Essential (primary) hypertension: Secondary | ICD-10-CM

## 2014-03-17 DIAGNOSIS — I5032 Chronic diastolic (congestive) heart failure: Secondary | ICD-10-CM

## 2014-03-17 LAB — GLUCOSE, CAPILLARY
GLUCOSE-CAPILLARY: 255 mg/dL — AB (ref 70–99)
GLUCOSE-CAPILLARY: 231 mg/dL — AB (ref 70–99)
Glucose-Capillary: 138 mg/dL — ABNORMAL HIGH (ref 70–99)
Glucose-Capillary: 275 mg/dL — ABNORMAL HIGH (ref 70–99)

## 2014-03-17 NOTE — Progress Notes (Signed)
Patient ID: Joshua [name], male   DOB: 1951-04-13, 63 y.o.   MRN: [id number]   This is an acute visit.  Level care skilled.  Facility [facility] .  Chief complaint-acute visit followup diastolic CHF--CDF-hypertension .  History of present illness.  Patient is a pleasant 63 year old male with quite a complicated medical history including diastolic CHF recent history CDiff a right leg which apparently is improving significantly  He also recently underwent cardiac arrest-with asystole-was evaluated cardiology apparently Coreg as well asVerapamil was discontinued.  He continues on Lasix 40 mg twice a day for history of diastolic CHF-it appears that his weights have been stable  Able to slowly declining weight today is 142.8 it was 149 about a month ago he is eating well I suspect this is more fluid loss.   Patient has had some occurrence of diarrhea-he does have a history of C. difficile and once again this is tested positive and as of last night he was restarted on by mouth vancomycin for a two-week course-is not complaining of any abdominal pain or discomfort again he is eating well.  I do note his potassium is 3.2 this is being supplemented but we will have to increase this obviously--- some of this I suspect could be do to the diarrhea  Other issue has been his hypertension and apparently this does respond when he receives his medications which are numerous including hydralazine 50 mg every 8 hours-lisinopril 20 mg a day as well as clonidine 0.1 mg 3 times a day.  Recent blood pressures 177/98-169/92-132/71-I did take it manually today and got 158/80.  .  Family medical social history as been reviewed from previous progress notes including 02/28/2014-and 02/16/2014 ---  Medications have been reviewed per [facility].   Review of systems.  In general denies any fever or chills.  Respiratory no complaints of chest pain.  Cardiac no chest pain he does have lower extremity edema which appears to be  chronic.  GI does not complaining of any abdominal discomfort nausea or vomiting--he does have diarrhea however apparently 3-4 times a day today and yesterday.  Muscle skeletal is not complaining of joint pain this evening  Neurologic-no complaints of headache dizziness or syncopal-type feelings.    Physical exam.   Temperature 98.4 pulse 91 respirations 20 blood pressure 158/80 taken manually weight is 142.8 this appears down about 7 pounds the past month In general this is a fairly well-nourished elderly male in no distress sitting comfortably in his wheelchair.  His skin is warm and dry.  Chest is clear to auscultation there is no labored breathing.  Heart is regular rate and rhythm without murmur gallop or rub he continues with 2-+ lower extremity edema --this may be slightly improved from previous exams  Abdomen is soft nontender with positive bowel sounds.  Muscle skeletal is able to move all extremities x4 his baseline does have lower extremity weakness I suspect this is complicated with his edema.  Neurologic appears to be grossly intact his speech is clear.  Psych he appears alert and oriented continues to be pleasant   Labs. 03/17/2014.  WBC 5.6 hemoglobin 9.1 platelets 378.  Sodium 141 potassium 3.2 BUN 30 creatinine 1.1.  03/14/2014.  INR 2.45.    02/28/2014.  WBC 6.3 hemoglobin 8.9 platelets 411.  INR 1.68.  02/23/2014.  WBC 6.8 hemoglobin 9.6 platelets 442.  Sodium 138 potassium 3.9 BUN 38 creatinine 1.25 .  Assessment plan.  #1-diastolic CHF-at this point appears stable on Lasix-he appears to  be losing some weight I suspect this is fluid related since he is quite well not complaining of any chest pain or shortness of breath continue to monitor weight closely .  #2-history of DVT--INR is therapeutic on 9 mg of Coumadin he is now on an antibiotic we'll have to monitor this closely will check a PT/INR tomorrow   #3-anemia I suspect there is an element of chronic  disease here he is on iron 3 times a day--as been relatively stable.  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

## 2014-03-18 LAB — GLUCOSE, CAPILLARY
GLUCOSE-CAPILLARY: 162 mg/dL — AB (ref 70–99)
GLUCOSE-CAPILLARY: 308 mg/dL — AB (ref 70–99)
Glucose-Capillary: 224 mg/dL — ABNORMAL HIGH (ref 70–99)
Glucose-Capillary: 144 mg/dL — ABNORMAL HIGH (ref 70–99)

## 2014-03-19 LAB — GLUCOSE, CAPILLARY
Glucose-Capillary: 172 mg/dL — ABNORMAL HIGH (ref 70–99)
Glucose-Capillary: 242 mg/dL — ABNORMAL HIGH (ref 70–99)
Glucose-Capillary: 207 mg/dL — ABNORMAL HIGH (ref 70–99)

## 2014-03-20 LAB — GLUCOSE, CAPILLARY
GLUCOSE-CAPILLARY: 164 mg/dL — AB (ref 70–99)
GLUCOSE-CAPILLARY: 255 mg/dL — AB (ref 70–99)
GLUCOSE-CAPILLARY: 278 mg/dL — AB (ref 70–99)

## 2014-03-21 LAB — GLUCOSE, CAPILLARY
GLUCOSE-CAPILLARY: 267 mg/dL — AB (ref 70–99)
Glucose-Capillary: 165 mg/dL — ABNORMAL HIGH (ref 70–99)
Glucose-Capillary: 165 mg/dL — ABNORMAL HIGH (ref 70–99)
Glucose-Capillary: 195 mg/dL — ABNORMAL HIGH (ref 70–99)

## 2014-03-22 LAB — GLUCOSE, CAPILLARY
GLUCOSE-CAPILLARY: 224 mg/dL — AB (ref 70–99)
GLUCOSE-CAPILLARY: 247 mg/dL — AB (ref 70–99)
Glucose-Capillary: 169 mg/dL — ABNORMAL HIGH (ref 70–99)
Glucose-Capillary: 220 mg/dL — ABNORMAL HIGH (ref 70–99)

## 2014-03-23 LAB — GLUCOSE, CAPILLARY
GLUCOSE-CAPILLARY: 200 mg/dL — AB (ref 70–99)
GLUCOSE-CAPILLARY: 249 mg/dL — AB (ref 70–99)
Glucose-Capillary: 228 mg/dL — ABNORMAL HIGH (ref 70–99)

## 2014-03-24 LAB — GLUCOSE, CAPILLARY
GLUCOSE-CAPILLARY: 282 mg/dL — AB (ref 70–99)
Glucose-Capillary: 236 mg/dL — ABNORMAL HIGH (ref 70–99)
Glucose-Capillary: 175 mg/dL — ABNORMAL HIGH (ref 70–99)
Glucose-Capillary: 250 mg/dL — ABNORMAL HIGH (ref 70–99)

## 2014-03-25 LAB — GLUCOSE, CAPILLARY
GLUCOSE-CAPILLARY: 214 mg/dL — AB (ref 70–99)
Glucose-Capillary: 193 mg/dL — ABNORMAL HIGH (ref 70–99)
Glucose-Capillary: 216 mg/dL — ABNORMAL HIGH (ref 70–99)

## 2014-03-26 LAB — GLUCOSE, CAPILLARY
GLUCOSE-CAPILLARY: 184 mg/dL — AB (ref 70–99)
GLUCOSE-CAPILLARY: 217 mg/dL — AB (ref 70–99)
Glucose-Capillary: 260 mg/dL — ABNORMAL HIGH (ref 70–99)
Glucose-Capillary: 163 mg/dL — ABNORMAL HIGH (ref 70–99)

## 2014-03-27 LAB — GLUCOSE, CAPILLARY
GLUCOSE-CAPILLARY: 156 mg/dL — AB (ref 70–99)
Glucose-Capillary: 280 mg/dL — ABNORMAL HIGH (ref 70–99)
Glucose-Capillary: 167 mg/dL — ABNORMAL HIGH (ref 70–99)
Glucose-Capillary: 158 mg/dL — ABNORMAL HIGH (ref 70–99)

## 2014-03-28 ENCOUNTER — Non-Acute Institutional Stay (SKILLED_NURSING_FACILITY): Payer: Self-pay | Admitting: Internal Medicine

## 2014-03-28 DIAGNOSIS — I82439 Acute embolism and thrombosis of unspecified popliteal vein: Secondary | ICD-10-CM

## 2014-03-28 DIAGNOSIS — A0472 Enterocolitis due to Clostridium difficile, not specified as recurrent: Secondary | ICD-10-CM

## 2014-03-28 DIAGNOSIS — I5032 Chronic diastolic (congestive) heart failure: Secondary | ICD-10-CM

## 2014-03-28 DIAGNOSIS — E1129 Type 2 diabetes mellitus with other diabetic kidney complication: Secondary | ICD-10-CM

## 2014-03-28 DIAGNOSIS — I824Y9 Acute embolism and thrombosis of unspecified deep veins of unspecified proximal lower extremity: Secondary | ICD-10-CM

## 2014-03-28 LAB — GLUCOSE, CAPILLARY
GLUCOSE-CAPILLARY: 145 mg/dL — AB (ref 70–99)
GLUCOSE-CAPILLARY: 268 mg/dL — AB (ref 70–99)
Glucose-Capillary: 173 mg/dL — ABNORMAL HIGH (ref 70–99)

## 2014-03-29 LAB — GLUCOSE, CAPILLARY: GLUCOSE-CAPILLARY: 145 mg/dL — AB (ref 70–99)

## 2014-03-30 LAB — GLUCOSE, CAPILLARY
GLUCOSE-CAPILLARY: 115 mg/dL — AB (ref 70–99)
Glucose-Capillary: 140 mg/dL — ABNORMAL HIGH (ref 70–99)
Glucose-Capillary: 130 mg/dL — ABNORMAL HIGH (ref 70–99)

## 2014-03-30 NOTE — Progress Notes (Addendum)
Patient ID: Joshua [name], male   DOB: [birth year], 63 y.o.   MRN: [id number]                  PROGRESS NOTE  DATE:  03/28/2014    FACILITY: [place]    LEVEL OF CARE:   SNF   Acute Visit   CHIEF COMPLAINT:  Review of edema, other medical issues.    HISTORY OF PRESENT ILLNESS:  This is a 63 year-old man who initially came to [place] at the end of February, at which time he had a diabetic foot infection, undergoing an I&D and antibiotic therapy.    His other medical issues include type 2 diabetes, diastolic heart failure, hypoalbuminemia, chronic renal insufficiency, and proteinuria.    He also had acute acalculus cholecystitis during this hospitalization.     He was readmitted to the facility after developing pseudomembranous colitis.   He went for an x-ray at Radiology and then underwent 14 minutes of ACLS when he was asystolic.  He underwent a left heart catheterization.  An echo showed an EF of 55-60%.    He has been on 40 b.i.d. of Lasix for chronic diastolic heart failure.  He has been refusing this some of the time.  His weight was 151.3 pounds on 02/26/2014 and 141.5 pounds on 05/__/2015.    His blood sugars have been under marginal control on NovoLog 2/4/4.  He is not on any long-acting insulin.  His blood sugars yesterday were 156, 280, 167, 158.    INR today is 1.39.  This, I believe, was due to a DVT of the popliteal vein during his original hospitalization in February.  He has also been put on Lovenox.  I am not really sure when this was started.     REVIEW OF SYSTEMS:   CHEST/RESPIRATORY:  The patient does not complain of shortness of breath.  He is still on oxygen 2 L.   CARDIAC:   No complaints of chest pain.   GI:  No diarrhea.   SKIN:  Extremities:  The wound care nurse states his foot wound is just about closed.    PHYSICAL EXAMINATION:   VITAL SIGNS:   O2 SATURATIONS:  95% on 2 L.   PULSE:   91.   RESPIRATIONS:  16 and unlabored.   GENERAL  APPEARANCE:  Chronically ill-looking man, but in no distress.   CHEST/RESPIRATORY:  Crackles in the right lower lobe.  Left lung is clear.  There is no wheezing and his work of breathing is normal.   CARDIOVASCULAR:  CARDIAC:   Heart sounds are normal.  JVP is not elevated.  There are no gallops.   EDEMA/VARICOSITIES:  He still has 1-2+ coccyx edema, some edema of his lower extremities.  However, he probably also has significant venous stasis.    ASSESSMENT/PLAN:  Diastolic heart failure.  I will reduce his Lasix to 40 mg once a day.    Chronic oxygen use since sometime in his original hospitalization.  This may be able to be tapered.   Diabetic foot infection.   Apparently, the surgical wounds on the bottom of the foot have healed.  There is still a surgical wound on the dorsum of the foot which I will need to follow.    Type 2 diabetes.  On insulin.  He is only on a small dose of NovoLog insulin three times a day.  I am wondering if we can switch him to oral agents.  This  might make discharge easier for this man if indeed he can be discharged  DVTs, I think sometime during the initial hospitalization.  He has remained on Coumadin and now on Lovenox.  I am not really sure what has been going on here.  His INR today is 1.39 on Coumadin 9 mg daily.    Diabetic nephropathy.  His last BUN was 37, creatinine 1.09 on 03/21/2014.  I believe I did a spot urine on him and he spilled a significant amount of protein.  He is on an ace inhibitor

## 2014-03-31 LAB — GLUCOSE, CAPILLARY
Glucose-Capillary: 113 mg/dL — ABNORMAL HIGH (ref 70–99)
Glucose-Capillary: 122 mg/dL — ABNORMAL HIGH (ref 70–99)

## 2014-04-01 LAB — GLUCOSE, CAPILLARY
GLUCOSE-CAPILLARY: 171 mg/dL — AB (ref 70–99)
Glucose-Capillary: 112 mg/dL — ABNORMAL HIGH (ref 70–99)

## 2014-04-02 LAB — GLUCOSE, CAPILLARY
GLUCOSE-CAPILLARY: 116 mg/dL — AB (ref 70–99)
GLUCOSE-CAPILLARY: 205 mg/dL — AB (ref 70–99)

## 2014-04-03 LAB — GLUCOSE, CAPILLARY: Glucose-Capillary: 128 mg/dL — ABNORMAL HIGH (ref 70–99)

## 2014-04-04 LAB — GLUCOSE, CAPILLARY
Glucose-Capillary: 196 mg/dL — ABNORMAL HIGH (ref 70–99)
Glucose-Capillary: 85 mg/dL (ref 70–99)
Glucose-Capillary: 203 mg/dL — ABNORMAL HIGH (ref 70–99)

## 2014-04-05 LAB — GLUCOSE, CAPILLARY
GLUCOSE-CAPILLARY: 144 mg/dL — AB (ref 70–99)
Glucose-Capillary: 81 mg/dL (ref 70–99)

## 2014-04-06 LAB — GLUCOSE, CAPILLARY: GLUCOSE-CAPILLARY: 145 mg/dL — AB (ref 70–99)

## 2014-04-07 LAB — GLUCOSE, CAPILLARY
Glucose-Capillary: 156 mg/dL — ABNORMAL HIGH (ref 70–99)
Glucose-Capillary: 128 mg/dL — ABNORMAL HIGH (ref 70–99)
Glucose-Capillary: 107 mg/dL — ABNORMAL HIGH (ref 70–99)

## 2014-04-08 LAB — GLUCOSE, CAPILLARY: Glucose-Capillary: 96 mg/dL (ref 70–99)

## 2014-04-09 LAB — GLUCOSE, CAPILLARY
GLUCOSE-CAPILLARY: 182 mg/dL — AB (ref 70–99)
Glucose-Capillary: 159 mg/dL — ABNORMAL HIGH (ref 70–99)
Glucose-Capillary: 197 mg/dL — ABNORMAL HIGH (ref 70–99)

## 2014-04-10 LAB — GLUCOSE, CAPILLARY
GLUCOSE-CAPILLARY: 219 mg/dL — AB (ref 70–99)
Glucose-Capillary: 80 mg/dL (ref 70–99)

## 2014-04-11 LAB — GLUCOSE, CAPILLARY
GLUCOSE-CAPILLARY: 92 mg/dL (ref 70–99)
Glucose-Capillary: 120 mg/dL — ABNORMAL HIGH (ref 70–99)

## 2014-04-12 LAB — GLUCOSE, CAPILLARY
GLUCOSE-CAPILLARY: 108 mg/dL — AB (ref 70–99)
Glucose-Capillary: 59 mg/dL — ABNORMAL LOW (ref 70–99)
Glucose-Capillary: 80 mg/dL (ref 70–99)

## 2014-04-13 ENCOUNTER — Inpatient Hospital Stay (HOSPITAL_COMMUNITY): Payer: Self-pay

## 2014-04-13 ENCOUNTER — Inpatient Hospital Stay (HOSPITAL_COMMUNITY)
Admission: EM | Admit: 2014-04-13 | Discharge: 2014-04-18 | DRG: 194 | Disposition: A | Discharge status: Skilled Nursing Facility | Payer: Self-pay | Attending: Internal Medicine | Admitting: Internal Medicine

## 2014-04-13 ENCOUNTER — Emergency Department (HOSPITAL_COMMUNITY): Payer: Self-pay

## 2014-04-13 ENCOUNTER — Other Ambulatory Visit: Payer: Self-pay | Admitting: Radiology

## 2014-04-13 ENCOUNTER — Encounter (HOSPITAL_COMMUNITY): Payer: Self-pay | Admitting: Emergency Medicine

## 2014-04-13 DIAGNOSIS — R748 Abnormal levels of other serum enzymes: Secondary | ICD-10-CM

## 2014-04-13 DIAGNOSIS — N049 Nephrotic syndrome with unspecified morphologic changes: Secondary | ICD-10-CM | POA: Diagnosis present

## 2014-04-13 DIAGNOSIS — R0600 Dyspnea, unspecified: Secondary | ICD-10-CM | POA: Diagnosis present

## 2014-04-13 DIAGNOSIS — Z86718 Personal history of other venous thrombosis and embolism: Secondary | ICD-10-CM

## 2014-04-13 DIAGNOSIS — E8809 Other disorders of plasma-protein metabolism, not elsewhere classified: Secondary | ICD-10-CM | POA: Diagnosis present

## 2014-04-13 DIAGNOSIS — Z79899 Other long term (current) drug therapy: Secondary | ICD-10-CM

## 2014-04-13 DIAGNOSIS — Z7901 Long term (current) use of anticoagulants: Secondary | ICD-10-CM

## 2014-04-13 DIAGNOSIS — J9610 Chronic respiratory failure, unspecified whether with hypoxia or hypercapnia: Secondary | ICD-10-CM | POA: Diagnosis present

## 2014-04-13 DIAGNOSIS — J189 Pneumonia, unspecified organism: Principal | ICD-10-CM

## 2014-04-13 DIAGNOSIS — R809 Proteinuria, unspecified: Secondary | ICD-10-CM | POA: Diagnosis present

## 2014-04-13 DIAGNOSIS — K922 Gastrointestinal hemorrhage, unspecified: Secondary | ICD-10-CM

## 2014-04-13 DIAGNOSIS — I129 Hypertensive chronic kidney disease with stage 1 through stage 4 chronic kidney disease, or unspecified chronic kidney disease: Secondary | ICD-10-CM | POA: Diagnosis present

## 2014-04-13 DIAGNOSIS — D638 Anemia in other chronic diseases classified elsewhere: Secondary | ICD-10-CM | POA: Diagnosis present

## 2014-04-13 DIAGNOSIS — I82439 Acute embolism and thrombosis of unspecified popliteal vein: Secondary | ICD-10-CM

## 2014-04-13 DIAGNOSIS — I509 Heart failure, unspecified: Secondary | ICD-10-CM

## 2014-04-13 DIAGNOSIS — D649 Anemia, unspecified: Secondary | ICD-10-CM

## 2014-04-13 DIAGNOSIS — N179 Acute kidney failure, unspecified: Secondary | ICD-10-CM

## 2014-04-13 DIAGNOSIS — R079 Chest pain, unspecified: Secondary | ICD-10-CM

## 2014-04-13 DIAGNOSIS — I5032 Chronic diastolic (congestive) heart failure: Secondary | ICD-10-CM

## 2014-04-13 DIAGNOSIS — I10 Essential (primary) hypertension: Secondary | ICD-10-CM | POA: Diagnosis present

## 2014-04-13 DIAGNOSIS — N289 Disorder of kidney and ureter, unspecified: Secondary | ICD-10-CM

## 2014-04-13 DIAGNOSIS — R601 Generalized edema: Secondary | ICD-10-CM

## 2014-04-13 DIAGNOSIS — N183 Chronic kidney disease, stage 3 unspecified: Secondary | ICD-10-CM | POA: Diagnosis present

## 2014-04-13 DIAGNOSIS — D509 Iron deficiency anemia, unspecified: Secondary | ICD-10-CM

## 2014-04-13 DIAGNOSIS — E1129 Type 2 diabetes mellitus with other diabetic kidney complication: Secondary | ICD-10-CM

## 2014-04-13 DIAGNOSIS — R609 Edema, unspecified: Secondary | ICD-10-CM

## 2014-04-13 DIAGNOSIS — A0472 Enterocolitis due to Clostridium difficile, not specified as recurrent: Secondary | ICD-10-CM

## 2014-04-13 DIAGNOSIS — Z794 Long term (current) use of insulin: Secondary | ICD-10-CM

## 2014-04-13 DIAGNOSIS — Z87891 Personal history of nicotine dependence: Secondary | ICD-10-CM

## 2014-04-13 DIAGNOSIS — K224 Dyskinesia of esophagus: Secondary | ICD-10-CM | POA: Diagnosis present

## 2014-04-13 DIAGNOSIS — N058 Unspecified nephritic syndrome with other morphologic changes: Secondary | ICD-10-CM | POA: Diagnosis present

## 2014-04-13 LAB — CBC WITH DIFFERENTIAL/PLATELET
BASOS PCT: 0 % (ref 0–1)
Basophils Absolute: 0 10*3/uL (ref 0.0–0.1)
EOS ABS: 0.3 10*3/uL (ref 0.0–0.7)
Eosinophils Relative: 3 % (ref 0–5)
HCT: 32.1 % — ABNORMAL LOW (ref 39.0–52.0)
Hemoglobin: 10.3 g/dL — ABNORMAL LOW (ref 13.0–17.0)
Lymphocytes Relative: 18 % (ref 12–46)
Lymphs Abs: 1.5 10*3/uL (ref 0.7–4.0)
MCH: 25.6 pg — AB (ref 26.0–34.0)
MCHC: 32.1 g/dL (ref 30.0–36.0)
MCV: 79.7 fL (ref 78.0–100.0)
Monocytes Absolute: 1 10*3/uL (ref 0.1–1.0)
Monocytes Relative: 13 % — ABNORMAL HIGH (ref 3–12)
NEUTROS PCT: 66 % (ref 43–77)
Neutro Abs: 5.4 10*3/uL (ref 1.7–7.7)
PLATELETS: 415 10*3/uL — AB (ref 150–400)
RBC: 4.03 MIL/uL — ABNORMAL LOW (ref 4.22–5.81)
RDW: 19.1 % — ABNORMAL HIGH (ref 11.5–15.5)
WBC: 8.1 10*3/uL (ref 4.0–10.5)

## 2014-04-13 LAB — PROTIME-INR
INR: 2.69 — ABNORMAL HIGH (ref 0.00–1.49)
PROTHROMBIN TIME: 27.7 s — AB (ref 11.6–15.2)

## 2014-04-13 LAB — COMPREHENSIVE METABOLIC PANEL
ALBUMIN: 2.4 g/dL — AB (ref 3.5–5.2)
ALK PHOS: 355 U/L — AB (ref 39–117)
ALT: 69 U/L — AB (ref 0–53)
AST: 43 U/L — ABNORMAL HIGH (ref 0–37)
BUN: 100 mg/dL — ABNORMAL HIGH (ref 6–23)
CALCIUM: 9 mg/dL (ref 8.4–10.5)
CO2: 19 mEq/L (ref 19–32)
Chloride: 101 mEq/L (ref 96–112)
Creatinine, Ser: 1.95 mg/dL — ABNORMAL HIGH (ref 0.50–1.35)
GFR calc Af Amer: 41 mL/min — ABNORMAL LOW (ref >90)
GFR calc non Af Amer: 35 mL/min — ABNORMAL LOW (ref >90)
Glucose, Bld: 226 mg/dL — ABNORMAL HIGH (ref 70–99)
POTASSIUM: 4.8 meq/L (ref 3.7–5.3)
SODIUM: 133 meq/L — AB (ref 137–147)
TOTAL PROTEIN: 7.9 g/dL (ref 6.0–8.3)
Total Bilirubin: 0.2 mg/dL — ABNORMAL LOW (ref 0.3–1.2)

## 2014-04-13 LAB — GLUCOSE, CAPILLARY
GLUCOSE-CAPILLARY: 162 mg/dL — AB (ref 70–99)
Glucose-Capillary: 122 mg/dL — ABNORMAL HIGH (ref 70–99)
Glucose-Capillary: 94 mg/dL (ref 70–99)
Glucose-Capillary: 95 mg/dL (ref 70–99)

## 2014-04-13 LAB — TROPONIN I
Troponin I: <0.3 ng/mL (ref <0.30)
Troponin I: <0.3 ng/mL (ref <0.30)

## 2014-04-13 LAB — LIPASE, BLOOD: LIPASE: 131 U/L — AB (ref 11–59)

## 2014-04-13 LAB — PRO B NATRIURETIC PEPTIDE: Pro B Natriuretic peptide (BNP): 1440 pg/mL — ABNORMAL HIGH (ref 0–125)

## 2014-04-13 MED ORDER — IPRATROPIUM-ALBUTEROL 0.5-2.5 (3) MG/3ML IN SOLN: 3.0000 mL | RESPIRATORY_TRACT | Status: DC | PRN

## 2014-04-13 MED ORDER — FLUOXETINE HCL 10 MG PO CAPS
10.0000 mg | ORAL_CAPSULE | Freq: Every day | ORAL | Status: DC
  Administered 2014-04-14 – 2014-04-16 (×3): 10 mg via ORAL
  Filled 2014-04-13 (×6): qty 1

## 2014-04-13 MED ORDER — INSULIN ASPART 100 UNIT/ML ~~LOC~~ SOLN
0.0000 [IU] | Freq: Three times a day (TID) | SUBCUTANEOUS | Status: DC
  Administered 2014-04-13: 2 [IU] via SUBCUTANEOUS
  Administered 2014-04-13 – 2014-04-14 (×2): 1 [IU] via SUBCUTANEOUS
  Administered 2014-04-14: 3 [IU] via SUBCUTANEOUS
  Administered 2014-04-15: 2 [IU] via SUBCUTANEOUS
  Administered 2014-04-15: 1 [IU] via SUBCUTANEOUS
  Administered 2014-04-15: 2 [IU] via SUBCUTANEOUS
  Administered 2014-04-16: 1 [IU] via SUBCUTANEOUS
  Administered 2014-04-16: 7 [IU] via SUBCUTANEOUS
  Administered 2014-04-17: 2 [IU] via SUBCUTANEOUS
  Administered 2014-04-17: 1 [IU] via SUBCUTANEOUS
  Administered 2014-04-17: 3 [IU] via SUBCUTANEOUS
  Administered 2014-04-18: 2 [IU] via SUBCUTANEOUS
  Administered 2014-04-18: 3 [IU] via SUBCUTANEOUS

## 2014-04-13 MED ORDER — POTASSIUM CHLORIDE CRYS ER 20 MEQ PO TBCR
40.0000 meq | EXTENDED_RELEASE_TABLET | Freq: Once | ORAL | Status: AC
  Administered 2014-04-13: 40 meq via ORAL
  Filled 2014-04-13: qty 2

## 2014-04-13 MED ORDER — DARBEPOETIN ALFA-POLYSORBATE 100 MCG/0.5ML IJ SOLN
100.0000 ug | INTRAMUSCULAR | Status: DC
  Filled 2014-04-13: qty 0.5

## 2014-04-13 MED ORDER — WARFARIN SODIUM 6 MG PO TABS
8.0000 mg | ORAL_TABLET | Freq: Once | ORAL | Status: AC
  Administered 2014-04-13: 8 mg via ORAL
  Filled 2014-04-13 (×2): qty 1

## 2014-04-13 MED ORDER — LISINOPRIL 10 MG PO TABS
20.0000 mg | ORAL_TABLET | Freq: Every day | ORAL | Status: DC
  Administered 2014-04-13 – 2014-04-18 (×6): 20 mg via ORAL
  Filled 2014-04-13 (×6): qty 2

## 2014-04-13 MED ORDER — WARFARIN - PHARMACIST DOSING INPATIENT
Status: DC
  Administered 2014-04-13: 18:00:00

## 2014-04-13 MED ORDER — ONDANSETRON HCL 4 MG PO TABS: 4.0000 mg | ORAL_TABLET | Freq: Four times a day (QID) | ORAL | Status: DC | PRN

## 2014-04-13 MED ORDER — FERROUS SULFATE 325 (65 FE) MG PO TABS
325.0000 mg | ORAL_TABLET | Freq: Three times a day (TID) | ORAL | Status: DC
  Administered 2014-04-13 – 2014-04-14 (×4): 325 mg via ORAL
  Filled 2014-04-13 (×4): qty 1

## 2014-04-13 MED ORDER — DEXTROSE 5 % IV SOLN
500.0000 mg | INTRAVENOUS | Status: DC
  Administered 2014-04-13: 500 mg via INTRAVENOUS
  Filled 2014-04-13 (×2): qty 500

## 2014-04-13 MED ORDER — PRO-STAT SUGAR FREE PO LIQD
30.0000 mL | Freq: Two times a day (BID) | ORAL | Status: DC
  Administered 2014-04-13 – 2014-04-18 (×10): 30 mL via ORAL
  Filled 2014-04-13 (×10): qty 30

## 2014-04-13 MED ORDER — ACETAMINOPHEN 650 MG RE SUPP: 650.0000 mg | Freq: Four times a day (QID) | RECTAL | Status: DC | PRN

## 2014-04-13 MED ORDER — DEXTROSE 5 % IV SOLN
1.0000 g | Freq: Once | INTRAVENOUS | Status: AC
  Administered 2014-04-13: 1 g via INTRAVENOUS
  Filled 2014-04-13: qty 10

## 2014-04-13 MED ORDER — DEXTROSE 5 % IV SOLN
500.0000 mg | Freq: Once | INTRAVENOUS | Status: AC
  Administered 2014-04-13: 500 mg via INTRAVENOUS

## 2014-04-13 MED ORDER — FUROSEMIDE 10 MG/ML IJ SOLN
40.0000 mg | Freq: Once | INTRAMUSCULAR | Status: AC
  Administered 2014-04-13: 40 mg via INTRAVENOUS
  Filled 2014-04-13: qty 4

## 2014-04-13 MED ORDER — CLONIDINE HCL 0.1 MG PO TABS
0.1000 mg | ORAL_TABLET | Freq: Three times a day (TID) | ORAL | Status: DC
  Administered 2014-04-13 – 2014-04-18 (×16): 0.1 mg via ORAL
  Filled 2014-04-13 (×16): qty 1

## 2014-04-13 MED ORDER — ONDANSETRON HCL 4 MG/2ML IJ SOLN: 4.0000 mg | Freq: Four times a day (QID) | INTRAMUSCULAR | Status: DC | PRN

## 2014-04-13 MED ORDER — ACETAMINOPHEN 325 MG PO TABS
650.0000 mg | ORAL_TABLET | Freq: Four times a day (QID) | ORAL | Status: DC | PRN
  Administered 2014-04-14 – 2014-04-15 (×2): 650 mg via ORAL
  Filled 2014-04-13 (×2): qty 2

## 2014-04-13 MED ORDER — HYDRALAZINE HCL 25 MG PO TABS
50.0000 mg | ORAL_TABLET | Freq: Three times a day (TID) | ORAL | Status: DC
  Administered 2014-04-13 – 2014-04-18 (×17): 50 mg via ORAL
  Filled 2014-04-13 (×17): qty 2

## 2014-04-13 MED ORDER — DEXTROSE 5 % IV SOLN
500.0000 mg | INTRAVENOUS | Status: DC
  Filled 2014-04-13 (×2): qty 500

## 2014-04-13 MED ORDER — FUROSEMIDE 40 MG PO TABS
40.0000 mg | ORAL_TABLET | Freq: Two times a day (BID) | ORAL | Status: DC
  Administered 2014-04-13 – 2014-04-15 (×6): 40 mg via ORAL
  Filled 2014-04-13 (×6): qty 1

## 2014-04-13 MED ORDER — DEXTROSE 5 % IV SOLN
1.0000 g | INTRAVENOUS | Status: DC
  Administered 2014-04-14 – 2014-04-17 (×4): 1 g via INTRAVENOUS
  Filled 2014-04-13 (×5): qty 10

## 2014-04-13 MED ORDER — ISOSORBIDE MONONITRATE ER 60 MG PO TB24
30.0000 mg | ORAL_TABLET | Freq: Every day | ORAL | Status: DC
  Administered 2014-04-13 – 2014-04-18 (×6): 30 mg via ORAL
  Filled 2014-04-13 (×6): qty 1

## 2014-04-13 NOTE — ED Provider Notes (Signed)
CSN: [id number]     Arrival date & time 04/13/14  0155 History   First MD Initiated Contact with Patient 04/13/14 0210     Chief Complaint  Patient presents with  . Chest Pain     (Consider location/radiation/quality/duration/timing/severity/associated sxs/prior Treatment) Patient is a 63 y.o. male presenting with chest pain. The history is provided by the patient.  Chest Pain He noted onset this evening of chest tightness and dyspnea when he lays flat. He feels fine when he is sitting up. He denies nausea, vomiting, diaphoresis. He does rate the tightness at 10/10 when present. He has not done anything to treat his symptoms. He states that he denies chronic swelling of his legs which has been resistant to treatment. Of note, he is taking warfarin because of DVT.  Past Medical History  Diagnosis Date  . 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 Hypertension 10/14/2012  . Type 2 diabetes mellitus   . Diabetic nephropathy with proteinuria   . 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 Diastolic heart failure   . Cellulitis   . Convulsions   . Iron deficiency anemia   . Venous thrombosis and embolism   . Anasarca   . Hypoalbuminemia   . Elevated alkaline phosphatase level   . Microcytic anemia   . GI bleed   . Nephrotic syndrome   . Difficulty in walking(719.7)   . Muscle weakness (generalized)   . Type II or unspecified type diabetes mellitus with renal manifestations, not stated as uncontrolled   . Iron deficiency anemia, unspecified    Past Surgical History  Procedure Laterality Date  . Knee arthroscopy  2010    3 times, right knee  . I&d extremity Left 01/17/2014    Procedure: IRRIGATION AND DEBRIDEMENT  LEFT FOOT;  Surgeon: Wylene Simmer, MD;  Location: [facility];  Service: Orthopedics;  Laterality: Left;   History reviewed. No pertinent family history. History  Substance Use Topics  . Smoking status: Former Smoker -- 20 years    Types: Cigarettes    Quit  date: 11/25/1992  . Smokeless tobacco: Never Used  . Alcohol Use: No     Comment: Rarely.    Review of Systems  Cardiovascular: Positive for chest pain.  All other systems reviewed and are negative.     Allergies  Aspirin  Home Medications   Prior to Admission medications   Medication Sig Start Date End Date Taking? Authorizing Provider  acetaminophen (TYLENOL) 325 MG tablet Take 650 mg by mouth every 4 (four) hours as needed for mild pain, moderate pain or fever (Over 100 degrees.).    Historical Provider, MD  Amino Acids-Protein Hydrolys (FEEDING SUPPLEMENT, PRO-STAT SUGAR FREE 64,) LIQD Take 30 mLs by mouth 2 (two) times daily with a meal.    Historical Provider, MD  cloNIDine (CATAPRES) 0.1 MG tablet Take 1 tablet (0.1 mg total) by mouth 3 (three) times daily. 02/11/14   Belkys A Regalado, MD  darbepoetin (ARANESP) 100 MCG/0.5ML SOLN injection Inject 0.5 mLs (100 mcg total) into the skin every Wednesday at 6 PM. 02/11/14   Belkys A Regalado, MD  ferrous sulfate 325 (65 FE) MG tablet Take 1 tablet (325 mg total) by mouth 3 (three) times daily with meals. 01/19/14   Jessee Avers, MD  FLUoxetine (PROZAC) 10 MG capsule Take 1 capsule (10 mg total) by mouth daily. 02/11/14   Belkys A Regalado, MD  furosemide (LASIX) 40 MG tablet Take 1 tablet (40 mg total) by mouth 2 (  two) times daily. 02/11/14   Belkys A Regalado, MD  hydrALAZINE (APRESOLINE) 50 MG tablet Take 1 tablet (50 mg total) by mouth every 8 (eight) hours. 01/19/14   Jessee Avers, MD  insulin aspart (NOVOLOG) 100 UNIT/ML injection Inject 2 Units into the skin every morning. Take 2 units at breakfast and 4 units at lunch and dinner    Historical Provider, MD  ipratropium-albuterol (DUONEB) 0.5-2.5 (3) MG/3ML SOLN Take 3 mLs by nebulization every 4 (four) hours as needed (For SOB and Wheezing).     Historical Provider, MD  isosorbide mononitrate (IMDUR) 30 MG 24 hr tablet Take 1 tablet (30 mg total) by mouth daily. 01/19/14    Jessee Avers, MD  lisinopril (PRINIVIL,ZESTRIL) 20 MG tablet Take 1 tablet (20 mg total) by mouth daily. 02/24/14 02/24/15  Marrion Coy, MD  potassium chloride (KLOR-CON) 20 MEQ packet Take 40 mEq by mouth once.    Historical Provider, MD  warfarin (COUMADIN) 5 MG tablet Take 9 mg by mouth daily. Alternate 5mg  and 7.5mg  every other day 02/24/14   Marrion Coy, MD  zolpidem (AMBIEN) 5 MG tablet Take 1/2 tablet (2.5mg )  By mouth at bedtime as needed for insomnia  **note half tablets** 03/04/14   Tiffany L Reed, DO   BP 118/62  Pulse 84  Temp(Src) 98.2 F (36.8 C) (Oral)  Resp 19  Ht 5\' 5"  (1.651 m)  Wt 134 lb 5 oz (60.924 kg)  BMI 22.35 kg/m2  SpO2 100% Physical Exam  Nursing note and vitals reviewed.  64 year old male, resting comfortably and in no acute distress. Vital signs are normal. Oxygen saturation is 100%, which is normal. Head is normocephalic and atraumatic. PERRLA, EOMI. Oropharynx is clear. Neck is nontender and supple without adenopathy. Hepatojugular reflux is present. Back is nontender and there is no CVA tenderness. 1+ presacral edema is present Lungs are clear without rales, wheezes, or rhonchi. Chest is nontender. Heart has regular rate and rhythm without murmur. Abdomen is soft, flat, nontender without masses or hepatosplenomegaly and peristalsis is normoactive. Extremities have 1+ edema with venous stasis changes, full range of motion is present. Skin is warm and dry without rash. Neurologic: Mental status is normal, cranial nerves are intact, there are no motor or sensory deficits.  ED Course  Procedures (including critical care time) Labs Review Results for orders placed during the hospital encounter of 04/13/14  CBC WITH DIFFERENTIAL      Result Value Ref Range   WBC 8.1  4.0 - 10.5 K/uL   RBC 4.03 (*) 4.22 - 5.81 MIL/uL   Hemoglobin 10.3 (*) 13.0 - 17.0 g/dL   HCT 32.1 (*) 39.0 - 52.0 %   MCV 79.7  78.0 - 100.0 fL   MCH 25.6 (*) 26.0 - 34.0  pg   MCHC 32.1  30.0 - 36.0 g/dL   RDW 19.1 (*) 11.5 - 15.5 %   Platelets 415 (*) 150 - 400 K/uL   Neutrophils Relative % 66  43 - 77 %   Neutro Abs 5.4  1.7 - 7.7 K/uL   Lymphocytes Relative 18  12 - 46 %   Lymphs Abs 1.5  0.7 - 4.0 K/uL   Monocytes Relative 13 (*) 3 - 12 %   Monocytes Absolute 1.0  0.1 - 1.0 K/uL   Eosinophils Relative 3  0 - 5 %   Eosinophils Absolute 0.3  0.0 - 0.7 K/uL   Basophils Relative 0  0 - 1 %   Basophils Absolute  0.0  0.0 - 0.1 K/uL  COMPREHENSIVE METABOLIC PANEL      Result Value Ref Range   Sodium 133 (*) 137 - 147 mEq/L   Potassium 4.8  3.7 - 5.3 mEq/L   Chloride 101  96 - 112 mEq/L   CO2 19  19 - 32 mEq/L   Glucose, Bld 226 (*) 70 - 99 mg/dL   BUN 100 (*) 6 - 23 mg/dL   Creatinine, Ser 1.95 (*) 0.50 - 1.35 mg/dL   Calcium 9.0  8.4 - 10.5 mg/dL   Total Protein 7.9  6.0 - 8.3 g/dL   Albumin 2.4 (*) 3.5 - 5.2 g/dL   AST 43 (*) 0 - 37 U/L   ALT 69 (*) 0 - 53 U/L   Alkaline Phosphatase 355 (*) 39 - 117 U/L   Total Bilirubin 0.2 (*) 0.3 - 1.2 mg/dL   GFR calc non Af Amer 35 (*) >90 mL/min   GFR calc Af Amer 41 (*) >90 mL/min  PRO B NATRIURETIC PEPTIDE      Result Value Ref Range   Pro B Natriuretic peptide (BNP) 1440.0 (*) 0 - 125 pg/mL  TROPONIN I      Result Value Ref Range   Troponin I <0.30  <0.30 ng/mL  PROTIME-INR      Result Value Ref Range   Prothrombin Time 27.7 (*) 11.6 - 15.2 seconds   INR 2.69 (*) 0.00 - 1.49   Imaging Review Dg Chest 2 View  04/13/2014   CLINICAL DATA:  CHEST PAIN  EXAM: CHEST  2 VIEW  COMPARISON:  DG CHEST 1V PORT dated 02/06/2014  FINDINGS: Low lung volumes. Cardiac silhouette is enlarged. There is prominence of the interstitial markings and peribronchial cuffing. Linear increased density right lung base blunting of the left costophrenic angle. Ill-defined density left lung base.  IMPRESSION: Mild pulmonary edema.  Scarring versus atelectasis right lung base  Atelectasis versus infiltrate left lung base and small  left effusion.   Electronically Signed   By: Margaree Mackintosh M.D.   On: 04/13/2014 03:45   Images viewed by me.   EKG Interpretation   Date/Time:  Wednesday Apr 13 2014 02:08:21 EDT Ventricular Rate:  81 PR Interval:  187 QRS Duration: 77 QT Interval:  365 QTC Calculation: 424 R Axis:   76 Text Interpretation:  Sinus rhythm Probable left atrial enlargement  Borderline T wave abnormalities Minimal ST elevation, anterior leads When  compared with ECG of 01/31/2014, No significant change was found Confirmed  by [facility]  MD, Lloyde Ludlam ([id number]) on 04/13/2014 2:18:07 AM      MDM   Final diagnoses:  CHF (congestive heart failure)  Anemia  Renal insufficiency  Elevated liver enzymes    Orthopnea which seems likely to be due to congestive heart failure. He does have a history of congestive heart failure. Old records are reviewed and he had a recent hospitalization for cardiac arrest. ECG today is unchanged from recent ECG. Chest x-ray and a BNP will be checked as well as troponin.  Is negative. INR is therapeutic. Anemia is present unchanged from baseline and elevated liver enzymes are present which are also unchanged from baseline. BNP is moderately elevated although decreased from level in early March. Creatinine is noted to have increased substantially in the past 6 weeks. I feel he needs more diuresis, but this was to be done carefully in light of bump in his creatinine. Creatinine is lower than it had been in the aftermath of his cardiac  arrest, but overdiuresis could clearly be to further deterioration in renal function. Because of this, I feel that he needs to be monitored closely as an inpatient. Case is discussed with Dr. Daleen Bo of [facility] who agrees to admit the patient. Also, radiologist suggests possible left lower lobe pneumonia based on chest x-ray. Given patient's symptoms and lack of fever, pneumonia is felt to be unlikely but he is given initial dose of ceftriaxone and  azithromycin.  Delora Fuel, MD [phone]

## 2014-04-13 NOTE — Progress Notes (Signed)
ANTICOAGULATION CONSULT NOTE - Initial Consult  Pharmacy Consult for Coumadin Indication: VTE treatment, h/o DVT  Allergies  Allergen Reactions  . Aspirin Itching   Patient Measurements: Height: 5\' 5"  (165.1 cm) Weight: 134 lb 5 oz (60.924 kg) IBW/kg (Calculated) : 61.5  Vital Signs: Temp: 97.5 F (36.4 C) (05/20 0651) Temp src: Oral (05/20 0651) BP: 166/84 mmHg (05/20 0849) Pulse Rate: 83 (05/20 0651)  Labs:  Recent Labs  04/13/14 0321 04/13/14 0729  HGB 10.3*  --   HCT 32.1*  --   PLT 415*  --   LABPROT 27.7*  --   INR 2.69*  --   CREATININE 1.95*  --   TROPONINI <0.30 <0.30    Estimated Creatinine Clearance: 33.8 ml/min (by C-G formula based on Cr of 1.95).   Medical History: Past Medical History  Diagnosis Date  . 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 Hypertension 10/14/2012  . Type 2 diabetes mellitus   . Diabetic nephropathy with proteinuria   . 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 Diastolic heart failure   . Cellulitis   . Convulsions   . Iron deficiency anemia   . Venous thrombosis and embolism   . Anasarca   . Hypoalbuminemia   . Elevated alkaline phosphatase level   . Microcytic anemia   . GI bleed   . Nephrotic syndrome   . Difficulty in walking(719.7)   . Muscle weakness (generalized)   . Type II or unspecified type diabetes mellitus with renal manifestations, not stated as uncontrolled   . Iron deficiency anemia, unspecified     Medications:  Prescriptions prior to admission  Medication Sig Dispense Refill  . acetaminophen (TYLENOL) 325 MG tablet Take 650 mg by mouth every 4 (four) hours as needed for mild pain, moderate pain or fever (Over 100 degrees.).      Marland Kitchen cloNIDine (CATAPRES) 0.1 MG tablet Take 1 tablet (0.1 mg total) by mouth 3 (three) times daily.  60 tablet  11  . ferrous sulfate 325 (65 FE) MG tablet Take 1 tablet (325 mg total) by mouth 3 (three) times daily with meals.  90 tablet  3  . FLUoxetine  (PROZAC) 10 MG capsule Take 1 capsule (10 mg total) by mouth daily.  30 capsule  3  . furosemide (LASIX) 40 MG tablet Take 40 mg by mouth daily.      Marland Kitchen glimepiride (AMARYL) 2 MG tablet Take 2 mg by mouth daily with breakfast.      . hydrALAZINE (APRESOLINE) 50 MG tablet Take 1 tablet (50 mg total) by mouth every 8 (eight) hours.  90 tablet  0  . ipratropium-albuterol (DUONEB) 0.5-2.5 (3) MG/3ML SOLN Take 3 mLs by nebulization every 4 (four) hours as needed (For SOB and Wheezing).       . isosorbide mononitrate (IMDUR) 30 MG 24 hr tablet Take 1 tablet (30 mg total) by mouth daily.  30 tablet  0  . lisinopril (PRINIVIL,ZESTRIL) 30 MG tablet Take 30 mg by mouth daily.      . potassium chloride SA (K-DUR,KLOR-CON) 20 MEQ tablet Take 20 mEq by mouth daily.      Marland Kitchen warfarin (COUMADIN) 4 MG tablet Take 8 mg by mouth daily at 6 PM.        Assessment: 62yo male on chronic Coumadin PTA for h/o DVT.  INR is therapeutic on admission.  Home Coumadin dose is listed above.  Pt is being started on Zithromax which can interact with Coumadin to increase  INR.  Goal of Therapy:  INR 2-3 Monitor platelets by anticoagulation protocol: Yes   Plan:  Coumadin 8mg  PO today x 1 (home dose) INR daily for now  [employer] 04/13/2014,12:42 PM

## 2014-04-13 NOTE — Progress Notes (Addendum)
ANTIBIOTIC CONSULT NOTE - INITIAL  Pharmacy Consult for Rocephin Indication: CAP  Allergies  Allergen Reactions  . Aspirin Itching   Patient Measurements: Height: 5\' 5"  (165.1 cm) Weight: 134 lb 5 oz (60.924 kg) IBW/kg (Calculated) : 61.5  Vital Signs: Temp: 97.5 F (36.4 C) (05/20 0651) Temp src: Oral (05/20 0651) BP: 166/84 mmHg (05/20 0849) Pulse Rate: 83 (05/20 0651) Intake/Output from previous day: 05/19 0701 - 05/20 0700 In: -  Out: 203 [Urine:201; Stool:2] Intake/Output from this shift: Total I/O In: -  Out: 950 [Urine:950]  Labs:  Recent Labs  04/13/14 0321  WBC 8.1  HGB 10.3*  PLT 415*  CREATININE 1.95*   Estimated Creatinine Clearance: 33.8 ml/min (by C-G formula based on Cr of 1.95). No results found for this basename: VANCOTROUGH, VANCOPEAK, VANCORANDOM, GENTTROUGH, GENTPEAK, GENTRANDOM, TOBRATROUGH, TOBRAPEAK, TOBRARND, AMIKACINPEAK, AMIKACINTROU, AMIKACIN,  in the last 72 hours   Microbiology: No results found for this or any previous visit (from the past 720 hour(s)).  Medical History: Past Medical History  Diagnosis Date  . 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 Hypertension 10/14/2012  . Type 2 diabetes mellitus   . Diabetic nephropathy with proteinuria   . 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 Diastolic heart failure   . Cellulitis   . Convulsions   . Iron deficiency anemia   . Venous thrombosis and embolism   . Anasarca   . Hypoalbuminemia   . Elevated alkaline phosphatase level   . Microcytic anemia   . GI bleed   . Nephrotic syndrome   . Difficulty in walking(719.7)   . Muscle weakness (generalized)   . Type II or unspecified type diabetes mellitus with renal manifestations, not stated as uncontrolled   . Iron deficiency anemia, unspecified    Medications:  Scheduled:  . azithromycin  500 mg Intravenous Q24H  . [START ON 04/14/2014] cefTRIAXone (ROCEPHIN)  IV  1 g Intravenous Q24H  . cloNIDine  0.1 mg  Oral TID  . darbepoetin  100 mcg Subcutaneous Q Wed-1800  . feeding supplement (PRO-STAT SUGAR FREE 64)  30 mL Oral BID WC  . ferrous sulfate  325 mg Oral TID WC  . FLUoxetine  10 mg Oral Daily  . furosemide  40 mg Oral BID  . hydrALAZINE  50 mg Oral 3 times per day  . insulin aspart  0-9 Units Subcutaneous TID WC  . isosorbide mononitrate  30 mg Oral Daily  . lisinopril  20 mg Oral Daily   Assessment: 63yo male admitted with CAP.  Asked to initiate Rocephin. Rocephin does not need renal adjustment.  Pt also on Zithromax.  Rocephin 5/20 >> Zithromax 5/20 >>  Goal of Therapy:  Eradicate infection.  Plan:  Rocephin 1gm IV q24hrs Continue Zithromax per MD, convert to PO when appropriate Monitor progress and labs  [employer] 04/13/2014,11:41 AM

## 2014-04-13 NOTE — ED Notes (Signed)
Patient assisted with walker and standby assist to restroom. A little unsteady gait. Patient back in bed. Given warm blankets.

## 2014-04-13 NOTE — H&P (Signed)
[facility] History and Physical  Joshua [name] IWL:[id number] DOB: August 09, 1951 DOA: 04/13/2014  Referring physician:  PCP: Duwaine Maxin, DO  Specialists:   Chief Complaint: SOB, DOE   HPI: Joshua [name] is a 63 y.o. 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, CKD, CHF, h/o cardiac arrest, previously treated extrapulmonary TB (patient is an immigrant from [place]), malnutrition, [place] with nephrotic syndrome presented with DOE and L sided chest pain; patient reports L sided intermittent chest pain, associated with SOB, cough, chills denies fever; He also reports mild worsening of leg edema, no PND, or orthopnea  -ED CXR showed possible infiltrate    Review of Systems: The patient denies anorexia, fever, weight loss,, vision loss, decreased hearing, hoarseness, chest pain, syncope, dyspnea on exertion, peripheral edema, balance deficits, hemoptysis, abdominal pain, melena, hematochezia, severe indigestion/heartburn, hematuria, incontinence, genital sores, muscle weakness, suspicious skin lesions, transient blindness, difficulty walking, depression, unusual weight change, abnormal bleeding, enlarged lymph nodes, angioedema, and breast masses.    Past Medical History  Diagnosis Date  . 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 Hypertension 10/14/2012  . Type 2 diabetes mellitus   . Diabetic nephropathy with proteinuria   . 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 Diastolic heart failure   . Cellulitis   . Convulsions   . Iron deficiency anemia   . Venous thrombosis and embolism   . Anasarca   . Hypoalbuminemia   . Elevated alkaline phosphatase level   . Microcytic anemia   . GI bleed   . Nephrotic syndrome   . Difficulty in walking(719.7)   . Muscle weakness (generalized)   . Type II or unspecified type diabetes mellitus with renal manifestations, not stated as uncontrolled   . Iron deficiency anemia, unspecified    Past Surgical History  Procedure  Laterality Date  . Knee arthroscopy  2010    3 times, right knee  . I&d extremity Left 01/17/2014    Procedure: IRRIGATION AND DEBRIDEMENT  LEFT FOOT;  Surgeon: Wylene Simmer, MD;  Location: [place];  Service: Orthopedics;  Laterality: Left;   Social History:  reports that he quit smoking about 21 years ago. His smoking use included Cigarettes. He smoked 0.00 packs per day for 20 years. He has never used smokeless tobacco. He reports that he does not drink alcohol or use illicit drugs. [place];  where does patient live--home, ALF, SNF? and with whom if at home? Yes;  Can patient participate in ADLs?  Allergies  Allergen Reactions  . Aspirin Itching    History reviewed. No pertinent family history.  (be sure to complete)  Prior to Admission medications   Medication Sig Start Date End Date Taking? Authorizing Provider  acetaminophen (TYLENOL) 325 MG tablet Take 650 mg by mouth every 4 (four) hours as needed for mild pain, moderate pain or fever (Over 100 degrees.).    Historical Provider, MD  Amino Acids-Protein Hydrolys (FEEDING SUPPLEMENT, PRO-STAT SUGAR FREE 64,) LIQD Take 30 mLs by mouth 2 (two) times daily with a meal.    Historical Provider, MD  cloNIDine (CATAPRES) 0.1 MG tablet Take 1 tablet (0.1 mg total) by mouth 3 (three) times daily. 02/11/14   Belkys A Regalado, MD  darbepoetin (ARANESP) 100 MCG/0.5ML SOLN injection Inject 0.5 mLs (100 mcg total) into the skin every Wednesday at 6 PM. 02/11/14   Belkys A Regalado, MD  ferrous sulfate 325 (65 FE) MG tablet Take 1 tablet (325 mg total) by mouth 3 (three) times  daily with meals. 01/19/14   Jessee Avers, MD  FLUoxetine (PROZAC) 10 MG capsule Take 1 capsule (10 mg total) by mouth daily. 02/11/14   Belkys A Regalado, MD  furosemide (LASIX) 40 MG tablet Take 1 tablet (40 mg total) by mouth 2 (two) times daily. 02/11/14   Belkys A Regalado, MD  hydrALAZINE (APRESOLINE) 50 MG tablet Take 1 tablet (50 mg total) by mouth every 8 (eight) hours.  01/19/14   Jessee Avers, MD  insulin aspart (NOVOLOG) 100 UNIT/ML injection Inject 2 Units into the skin every morning. Take 2 units at breakfast and 4 units at lunch and dinner    Historical Provider, MD  ipratropium-albuterol (DUONEB) 0.5-2.5 (3) MG/3ML SOLN Take 3 mLs by nebulization every 4 (four) hours as needed (For SOB and Wheezing).     Historical Provider, MD  isosorbide mononitrate (IMDUR) 30 MG 24 hr tablet Take 1 tablet (30 mg total) by mouth daily. 01/19/14   Jessee Avers, MD  lisinopril (PRINIVIL,ZESTRIL) 20 MG tablet Take 1 tablet (20 mg total) by mouth daily. 02/24/14 02/24/15  Marrion Coy, MD  potassium chloride (KLOR-CON) 20 MEQ packet Take 40 mEq by mouth once.    Historical Provider, MD  warfarin (COUMADIN) 5 MG tablet Take 9 mg by mouth daily. Alternate 80m and 7.512mevery other day 02/24/14   ChMarrion CoyMD  zolpidem (AMBIEN) 5 MG tablet Take 1/2 tablet (2.80m40m By mouth at bedtime as needed for insomnia  **note half tablets** 03/04/14   TifGayland CurryO   Physical Exam: Filed Vitals:   04/13/14 0500  BP: 125/65  Pulse: 73  Temp:   Resp: 12     General:  alert  Eyes: eom-i  ENT: no oral ulcers   Neck: supple   Cardiovascular: s1,s2 rrr  Respiratory: few crackles   Abdomen: soft, nt,nd   Skin: no few ecchymosis   Musculoskeletal: chronic LE edema  Psychiatric: no hallucinations   Neurologic: CN 2-12 intact, motor 5/5 bl  Labs on Admission:  Basic Metabolic Panel:  Recent Labs Lab 04/13/14 0321  NA 133*  K 4.8  CL 101  CO2 19  GLUCOSE 226*  BUN 100*  CREATININE 1.95*  CALCIUM 9.0   Liver Function Tests:  Recent Labs Lab 04/13/14 0321  AST 43*  ALT 69*  ALKPHOS 355*  BILITOT 0.2*  PROT 7.9  ALBUMIN 2.4*   No results found for this basename: LIPASE, AMYLASE,  in the last 168 hours No results found for this basename: AMMONIA,  in the last 168 hours CBC:  Recent Labs Lab 04/13/14 0321  WBC 8.1  NEUTROABS 5.4   HGB 10.3*  HCT 32.1*  MCV 79.7  PLT 415*   Cardiac Enzymes:  Recent Labs Lab 04/13/14 0321  TROPONINI <0.30    BNP (last 3 results)  Recent Labs  01/30/14 2028 04/13/14 0321  PROBNP 9165.0* 1440.0*   CBG:  Recent Labs Lab 04/11/14 0542 04/11/14 2154 04/12/14 0612 04/12/14 2201 04/12/14 2221  GLUCAP 92 120* 108* 59* 80    Radiological Exams on Admission: Dg Chest 2 View  04/13/2014   CLINICAL DATA:  CHEST PAIN  EXAM: CHEST  2 VIEW  COMPARISON:  DG CHEST 1V PORT dated 02/06/2014  FINDINGS: Low lung volumes. Cardiac silhouette is enlarged. There is prominence of the interstitial markings and peribronchial cuffing. Linear increased density right lung base blunting of the left costophrenic angle. Ill-defined density left lung base.  IMPRESSION: Mild pulmonary edema.  Scarring versus atelectasis  right lung base  Atelectasis versus infiltrate left lung base and small left effusion.   Electronically Signed   By: Margaree Mackintosh M.D.   On: 04/13/2014 03:45    EKG: Independently reviewed.   Assessment/Plan Principal Problem:   CHF (congestive heart failure) Active Problems:   Hypertension   Dyspnea  63 y.o. 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 Chest pain likely atypical; initial trop neg. ECG non specific st changes  -monitor serial ECG, trop; cont home regimen  -s/p LHC (01/2014): Mild non-obstructive CAD; LV function is normal by echo   2. Probable pneumonia; started on IV atx; cont bronchodilators, oxygen  3. DM HA1C-6.5; cont ISS 4. Chronic CHF; CXR mild edema; cont diuresis; monitor i/o, daily weight  5. VTE/DVT; cont coumadin per pharmacy  6. CKD III, nephrotic syndrome per nephrology thought DM related; -monitor renal function on  diuresis, ACE   7. Mild elevated alk phos; obtain abd [place], lipase, PSA   None;  if consultant consulted, please document name and whether formally or informally consulted  Code Status: full (must indicate code status--if unknown or must be presumed, indicate so) Family Communication:  D/w patient (indicate person spoken with, if applicable, with phone number if by telephone) Disposition Plan: pend clinical improvement  (indicate anticipated LOS)  Time spent: >35 minutes   Kinnie Feil [facility] Pager [phone]  If 7PM-7AM, please contact night-coverage [email or web address] Password [email or web address] 04/13/2014, 6:11 AM

## 2014-04-13 NOTE — Progress Notes (Signed)
Patient was admitted to the hospital earlier this morning by Dr. Daleen Bo  Patient seen and examined.  This is a complex 63 year old gentleman who was admitted to the hospital with left-sided chest pain. He has a history of congestive heart failure as well as chronic kidney disease. He has a recent diagnosis of DVT and is anticoagulated. He presents to the emergency room with complaints of left-sided chest pain, associated shortness of breath, cough and chills. Chest x-ray indicated possible pneumonia new started on antibiotics. Lab work indicates worsening renal function as well as BUN. He denies any signs of upper GI bleeding. He does have trace edema no sore extremity, but lungs are otherwise clear. He is continued on Lasix for now, since on admission, it was felt that he may have an element of volume overload. We'll repeat labs in the morning to ensure that renal function is stable. We'll also check stool for occult blood.  [employer]

## 2014-04-13 NOTE — ED Notes (Signed)
Pt c/o chest pain when lying flat. Pt states the pain started tonight.

## 2014-04-13 NOTE — Clinical Social Work Psychosocial (Signed)
Clinical Social Work Department BRIEF PSYCHOSOCIAL ASSESSMENT 04/13/2014  Patient:  Joshua [name], Joshua [name]     Account Number:  [id number]     Admit date:  04/13/2014  Clinical Social Worker:  Wyatt Haste  Date/Time:  04/13/2014 11:22 AM  Referred by:  CSW  Date Referred:  04/13/2014 Referred for  SNF Placement   Other Referral:   Interview type:  Patient Other interview type:    PSYCHOSOCIAL DATA Living Status:  FACILITY Admitted from facility:  [facility] Level of care:  [place] Primary support name:  [facility] Primary support relationship to patient:  FAMILY Degree of support available:   adequate    CURRENT CONCERNS Current Concerns  Post-Acute Placement   Other Concerns:    SOCIAL WORK ASSESSMENT / PLAN CSW met with pt at bedside. Pt alert and oriented and reports he has been a resident at [facility] for about 2 months. He states he was "sick" and needed to go there as he lived alone. Pt has a cousin who assists him some, but was not able to provide as much care as he needed. Per Tami at [facility], pt was difficult to place from [facility]. They have applied for Medicaid and disability for pt. He is currently receiving skilled care on their CHF unit. He requires limited to extensive assist with ADLs. Okay to return and no FL2 needed. Pt states "everyone is nice to me there" when discussing [facility] and wants to return at d/c.   Assessment/plan status:  Psychosocial Support/Ongoing Assessment of Needs Other assessment/ plan:   Information/referral to community resources:   [facility]    PATIENT'S/FAMILY'S RESPONSE TO PLAN OF CARE: Pt reports positive feelings regarding return to [facility] when medically stable. CSW will continue to follow.       Benay Pike, [place]

## 2014-04-13 NOTE — Care Management Utilization Note (Signed)
UR completed 

## 2014-04-13 NOTE — ED Notes (Signed)
Pt was given one nitro and 324mg  of asa enroute to facility.

## 2014-04-14 ENCOUNTER — Encounter (HOSPITAL_COMMUNITY): Payer: Self-pay | Admitting: Gastroenterology

## 2014-04-14 DIAGNOSIS — D649 Anemia, unspecified: Secondary | ICD-10-CM

## 2014-04-14 DIAGNOSIS — R748 Abnormal levels of other serum enzymes: Secondary | ICD-10-CM

## 2014-04-14 DIAGNOSIS — D509 Iron deficiency anemia, unspecified: Secondary | ICD-10-CM

## 2014-04-14 DIAGNOSIS — K922 Gastrointestinal hemorrhage, unspecified: Secondary | ICD-10-CM

## 2014-04-14 LAB — CBC
HCT: 27.9 % — ABNORMAL LOW (ref 39.0–52.0)
HEMOGLOBIN: 9.1 g/dL — AB (ref 13.0–17.0)
MCH: 25.8 pg — ABNORMAL LOW (ref 26.0–34.0)
MCHC: 32.6 g/dL (ref 30.0–36.0)
MCV: 79 fL (ref 78.0–100.0)
Platelets: 373 10*3/uL (ref 150–400)
RBC: 3.53 MIL/uL — AB (ref 4.22–5.81)
RDW: 19.3 % — ABNORMAL HIGH (ref 11.5–15.5)
WBC: 5 10*3/uL (ref 4.0–10.5)

## 2014-04-14 LAB — GLUCOSE, CAPILLARY
Glucose-Capillary: 130 mg/dL — ABNORMAL HIGH (ref 70–99)
Glucose-Capillary: 280 mg/dL — ABNORMAL HIGH (ref 70–99)
Glucose-Capillary: 98 mg/dL (ref 70–99)

## 2014-04-14 LAB — COMPREHENSIVE METABOLIC PANEL
ALBUMIN: 2.1 g/dL — AB (ref 3.5–5.2)
ALT: 51 U/L (ref 0–53)
AST: 28 U/L (ref 0–37)
Alkaline Phosphatase: 279 U/L — ABNORMAL HIGH (ref 39–117)
BUN: 88 mg/dL — AB (ref 6–23)
CALCIUM: 8.8 mg/dL (ref 8.4–10.5)
CHLORIDE: 103 meq/L (ref 96–112)
CO2: 19 mEq/L (ref 19–32)
Creatinine, Ser: 1.8 mg/dL — ABNORMAL HIGH (ref 0.50–1.35)
GFR calc Af Amer: 45 mL/min — ABNORMAL LOW (ref >90)
GFR calc non Af Amer: 39 mL/min — ABNORMAL LOW (ref >90)
Glucose, Bld: 135 mg/dL — ABNORMAL HIGH (ref 70–99)
Potassium: 4.7 mEq/L (ref 3.7–5.3)
Sodium: 134 mEq/L — ABNORMAL LOW (ref 137–147)
TOTAL PROTEIN: 7.2 g/dL (ref 6.0–8.3)
Total Bilirubin: 0.2 mg/dL — ABNORMAL LOW (ref 0.3–1.2)

## 2014-04-14 LAB — PROTIME-INR
INR: 3.35 — AB (ref 0.00–1.49)
Prothrombin Time: 32.7 seconds — ABNORMAL HIGH (ref 11.6–15.2)

## 2014-04-14 LAB — FREE PSA
PSA FREE: 0.49 ng/mL
PSA, Free Pct: 13 % — ABNORMAL LOW (ref >25)

## 2014-04-14 LAB — OCCULT BLOOD X 1 CARD TO LAB, STOOL: FECAL OCCULT BLD: POSITIVE — AB

## 2014-04-14 LAB — CLOSTRIDIUM DIFFICILE BY PCR: Toxigenic C. Difficile by PCR: POSITIVE — AB

## 2014-04-14 LAB — PSA: PSA: 3.77 ng/mL (ref <=4.00)

## 2014-04-14 MED ORDER — PANTOPRAZOLE SODIUM 40 MG IV SOLR
40.0000 mg | INTRAVENOUS | Status: DC
  Administered 2014-04-14: 40 mg via INTRAVENOUS
  Filled 2014-04-14: qty 40

## 2014-04-14 MED ORDER — AZITHROMYCIN 250 MG PO TABS
500.0000 mg | ORAL_TABLET | Freq: Every day | ORAL | Status: DC
  Administered 2014-04-14 – 2014-04-17 (×4): 500 mg via ORAL
  Filled 2014-04-14 (×4): qty 2

## 2014-04-14 NOTE — Progress Notes (Signed)
Joshua [name] for Coumadin Indication: h/o DVT  Allergies  Allergen Reactions  . Aspirin Itching   Patient Measurements: Height: 5\' 5"  (165.1 cm) Weight: 128 lb 14.4 oz (58.469 kg) IBW/kg (Calculated) : 61.5  Vital Signs: Temp: 98.1 F (36.7 C) (05/21 0534) Temp src: Oral (05/21 0534) BP: 108/62 mmHg (05/21 0804) Pulse Rate: 76 (05/21 0534)  Labs:  Recent Labs  04/13/14 0321 04/13/14 0729 04/13/14 1255 04/13/14 1907 04/14/14 0530  HGB 10.3*  --   --   --  9.1*  HCT 32.1*  --   --   --  27.9*  PLT 415*  --   --   --  373  LABPROT 27.7*  --   --   --  32.7*  INR 2.69*  --   --   --  3.35*  CREATININE 1.95*  --   --   --  1.80*  TROPONINI <0.30 <0.30 <0.30 <0.30  --     Estimated Creatinine Clearance: 35.2 ml/min (by C-G formula based on Cr of 1.8).   Medical History: Past Medical History  Diagnosis Date  . 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 Hypertension 10/14/2012  . Type 2 diabetes mellitus   . Diabetic nephropathy with proteinuria   . 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 Diastolic heart failure   . Cellulitis   . Convulsions   . Iron deficiency anemia   . Venous thrombosis and embolism   . Anasarca   . Hypoalbuminemia   . Elevated alkaline phosphatase level   . Microcytic anemia   . GI bleed   . Nephrotic syndrome   . Difficulty in walking(719.7)   . Muscle weakness (generalized)   . Type II or unspecified type diabetes mellitus with renal manifestations, not stated as uncontrolled   . Iron deficiency anemia, unspecified     Medications:  Prescriptions prior to admission  Medication Sig Dispense Refill  . acetaminophen (TYLENOL) 325 MG tablet Take 650 mg by mouth every 4 (four) hours as needed for mild pain, moderate pain or fever (Over 100 degrees.).      Marland Kitchen cloNIDine (CATAPRES) 0.1 MG tablet Take 1 tablet (0.1 mg total) by mouth 3 (three) times daily.  60 tablet  11  . ferrous sulfate  325 (65 FE) MG tablet Take 1 tablet (325 mg total) by mouth 3 (three) times daily with meals.  90 tablet  3  . FLUoxetine (PROZAC) 10 MG capsule Take 1 capsule (10 mg total) by mouth daily.  30 capsule  3  . furosemide (LASIX) 40 MG tablet Take 40 mg by mouth daily.      Marland Kitchen glimepiride (AMARYL) 2 MG tablet Take 2 mg by mouth daily with breakfast.      . hydrALAZINE (APRESOLINE) 50 MG tablet Take 1 tablet (50 mg total) by mouth every 8 (eight) hours.  90 tablet  0  . ipratropium-albuterol (DUONEB) 0.5-2.5 (3) MG/3ML SOLN Take 3 mLs by nebulization every 4 (four) hours as needed (For SOB and Wheezing).       . isosorbide mononitrate (IMDUR) 30 MG 24 hr tablet Take 1 tablet (30 mg total) by mouth daily.  30 tablet  0  . lisinopril (PRINIVIL,ZESTRIL) 30 MG tablet Take 30 mg by mouth daily.      . potassium chloride SA (K-DUR,KLOR-CON) 20 MEQ tablet Take 20 mEq by mouth daily.      Marland Kitchen warfarin (COUMADIN) 4 MG tablet Take 8 mg by mouth  daily at 6 PM.        Assessment: 63yo male on chronic Coumadin PTA for h/o DVT.  Home Coumadin dose is listed above.  INR is therapeutic on admission, but has trended >3 today.  Pt was started on Zithromax which can interact with Coumadin to increase INR. No bleeding noted.   Goal of Therapy:  INR 2-3   Plan:  Hold Coumadin today  Daily INR   [name] 04/14/2014,8:09 AM

## 2014-04-14 NOTE — Progress Notes (Signed)
ANTIBIOTIC CONSULT NOTE  Pharmacy Consult for Rocephin Indication: CAP  Allergies  Allergen Reactions  . Aspirin Itching   Patient Measurements: Height: 5\' 5"  (165.1 cm) Weight: 128 lb 14.4 oz (58.469 kg) IBW/kg (Calculated) : 61.5  Vital Signs: Temp: 98.1 F (36.7 C) (05/21 0534) Temp src: Oral (05/21 0534) BP: 108/62 mmHg (05/21 0804) Pulse Rate: 76 (05/21 0534) Intake/Output from previous day: 05/20 0701 - 05/21 0700 In: 730 [P.O.:480; IV Piggyback:250] Out: [id number] [Urine:1650] Intake/Output from this shift:    Labs:  Recent Labs  04/13/14 0321 04/14/14 0530  WBC 8.1 5.0  HGB 10.3* 9.1*  PLT 415* 373  CREATININE 1.95* 1.80*   Estimated Creatinine Clearance: 35.2 ml/min (by C-G formula based on Cr of 1.8). No results found for this basename: VANCOTROUGH, VANCOPEAK, VANCORANDOM, GENTTROUGH, GENTPEAK, GENTRANDOM, TOBRATROUGH, TOBRAPEAK, TOBRARND, AMIKACINPEAK, AMIKACINTROU, AMIKACIN,  in the last 72 hours   Microbiology: No results found for this or any previous visit (from the past 720 hour(s)).  Medical History: Past Medical History  Diagnosis Date  . 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 Hypertension 10/14/2012  . Type 2 diabetes mellitus   . Diabetic nephropathy with proteinuria   . 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 Diastolic heart failure   . Cellulitis   . Convulsions   . Iron deficiency anemia   . Venous thrombosis and embolism   . Anasarca   . Hypoalbuminemia   . Elevated alkaline phosphatase level   . Microcytic anemia   . GI bleed   . Nephrotic syndrome   . Difficulty in walking(719.7)   . Muscle weakness (generalized)   . Type II or unspecified type diabetes mellitus with renal manifestations, not stated as uncontrolled   . Iron deficiency anemia, unspecified    Medications:  Scheduled:  . azithromycin  500 mg Intravenous Q24H  . cefTRIAXone (ROCEPHIN)  IV  1 g Intravenous Q24H  . cloNIDine  0.1 mg Oral TID  .  darbepoetin  100 mcg Subcutaneous Q Wed-1800  . feeding supplement (PRO-STAT SUGAR FREE 64)  30 mL Oral BID WC  . ferrous sulfate  325 mg Oral TID WC  . FLUoxetine  10 mg Oral Daily  . furosemide  40 mg Oral BID  . hydrALAZINE  50 mg Oral 3 times per day  . insulin aspart  0-9 Units Subcutaneous TID WC  . isosorbide mononitrate  30 mg Oral Daily  . lisinopril  20 mg Oral Daily  . Warfarin - Pharmacist Dosing Inpatient   Does not apply Q24H   Assessment: 63yo male admitted with CAP.  Acute renal insufficiency on admission is improving.   Rocephin nor Zithromax require renal dose adjustment.    Rocephin 5/20 >> Zithromax 5/20 >>  Goal of Therapy:  Eradicate infection.  Plan:  Continue Rocephin 1gm IV q24hrs Continue Zithromax per MD, change to PO today per P&T policy (see below) No dose adjustments needed.  Pharmacy to sign off.  Please re-consult if needed  Joshua [name] Joshua [name] 04/14/2014,8:14 AM  PHARMACIST - PHYSICIAN COMMUNICATION CONCERNING: Antibiotic IV to Oral Route Change Policy  RECOMMENDATION: This patient is receiving Zithromax by the intravenous route.  Based on criteria approved by the Pharmacy and Therapeutics Committee, the antibiotic(s) is/are being converted to the equivalent oral dose form(s).   DESCRIPTION: These criteria include:  Patient being treated for a respiratory tract infection, urinary tract infection, cellulitis or clostridium difficile associated diarrhea if on metronidazole  The patient is not neutropenic and does  not exhibit a GI malabsorption state  The patient is eating (either orally or via tube) and/or has been taking other orally administered medications for a least 24 hours  The patient is improving clinically and has a Tmax < 100.5  If you have questions about this conversion, please contact the Pharmacy Department  [x]   [phone] )  Joshua [name] []   [phone] )  Joshua [name]  []   [phone] )  [facility] []   [phone]  )  [facility]

## 2014-04-14 NOTE — Progress Notes (Signed)
[facility] PROGRESS NOTE  Joshua [name] [id number] DOB: [birth year] DOA: 04/13/2014 PCP: Duwaine Maxin, DO  Assessment/Plan: 1. Chest pain. Atypical. He has ruled out for this he negative cardiac markers. Recent left heart cath 01/2014 which indicated mild nonobstructive disease. The liver function is normal by echocardiogram. 2. Possible pneumonia. Patient is on antibiotics. Continue bronchodilators and supportive oxygen. 3. Acute renal failure superimposed on chronic kidney disease stage III. Possibly related to excess volume. He is currently on oral Lasix. We'll continue to monitor urine output and renal function. 4. Elevated BUN. Possibly related to renal failure, although he does have heme positive stools. The patient is anticoagulated on Coumadin. There is concern that he may be having GI bleeding. Appreciate GI consultation. Will hold Coumadin for now, may need possible endoscopy. 5. History of DVT on Coumadin. Anticoagulation currently on hold due to concern for bleeding. 6. Chronic diastolic congestive heart failure. Continue Lasix 7. Anemia. Likely related to chronic disease. Hemoglobin appears to be near baseline at this time  Code Status: Full code Family Communication: Discussed with patient Disposition Plan: Likely discharge back to [facility]   Consultants:  Gastroenterology  Procedures:    Antibiotics:  Rocephin 5/20  Azithromycin 5/20  HPI/Subjective: No new complaints, has dark stools, no abdominal pain or shortness of breath  Objective: Filed Vitals:   04/14/14 1557  BP: 118/63  Pulse: 76  Temp: 98.1 F (36.7 C)  Resp: 20    Intake/Output Summary (Last 24 hours) at 04/14/14 1913 Last data filed at 04/14/14 [id number]  Gross per 24 hour  Intake    240 ml  Output    700 ml  Net   -460 ml   Filed Weights   04/13/14 0206 04/14/14 0541  Weight: 60.924 kg (134 lb 5 oz) 58.469 kg (128 lb 14.4 oz)    Exam:   General:   NAD  Cardiovascular: S1, S2 RRR  Respiratory: diminished breath sounds at bases  Abdomen: soft, nt, nd, bs+  Musculoskeletal: trace edema in LE b/l, chronic stasis changes   Data Reviewed: Basic Metabolic Panel:  Recent Labs Lab 04/13/14 0321 04/14/14 0530  NA 133* 134*  K 4.8 4.7  CL 101 103  CO2 19 19  GLUCOSE 226* 135*  BUN 100* 88*  CREATININE 1.95* 1.80*  CALCIUM 9.0 8.8   Liver Function Tests:  Recent Labs Lab 04/13/14 0321 04/14/14 0530  AST 43* 28  ALT 69* 51  ALKPHOS 355* 279*  BILITOT 0.2* 0.2*  PROT 7.9 7.2  ALBUMIN 2.4* 2.1*    Recent Labs Lab 04/13/14 0731  LIPASE 131*   No results found for this basename: AMMONIA,  in the last 168 hours CBC:  Recent Labs Lab 04/13/14 0321 04/14/14 0530  WBC 8.1 5.0  NEUTROABS 5.4  --   HGB 10.3* 9.1*  HCT 32.1* 27.9*  MCV 79.7 79.0  PLT 415* 373   Cardiac Enzymes:  Recent Labs Lab 04/13/14 0321 04/13/14 0729 04/13/14 1255 04/13/14 1907  TROPONINI <0.30 <0.30 <0.30 <0.30   BNP (last 3 results)  Recent Labs  01/30/14 2028 04/13/14 0321  PROBNP 9165.0* 1440.0*   CBG:  Recent Labs Lab 04/13/14 1711 04/13/14 2140 04/14/14 0733 04/14/14 1106 04/14/14 1710  GLUCAP 162* 95 130* 280* 98    No results found for this or any previous visit (from the past 240 hour(s)).   Studies: Dg Chest 2 View  04/13/2014   CLINICAL DATA:  CHEST PAIN  EXAM: CHEST  2  VIEW  COMPARISON:  DG CHEST 1V PORT dated 02/06/2014  FINDINGS: Low lung volumes. Cardiac silhouette is enlarged. There is prominence of the interstitial markings and peribronchial cuffing. Linear increased density right lung base blunting of the left costophrenic angle. Ill-defined density left lung base.  IMPRESSION: Mild pulmonary edema.  Scarring versus atelectasis right lung base  Atelectasis versus infiltrate left lung base and small left effusion.   Electronically Signed   By: Margaree Mackintosh M.D.   On: 04/13/2014 03:45   US Abdomen  Complete  04/13/2014   CLINICAL DATA:  Elevated alkaline phosphatase  EXAM: ULTRASOUND ABDOMEN COMPLETE  COMPARISON:  01/31/2014  FINDINGS: Gallbladder:  Small amount of dependent sludge within gallbladder. No shadowing calculi, gallbladder wall thickening or sonographic Murphy sign.  Common bile duct:  Diameter: Upper normal caliber 6 mm diameter, not significantly changed  Liver:  Normal appearance  IVC:  Normal appearance  Pancreas:  Normal appearance  Spleen:  Normal appearance, 6.4 cm length  Right Kidney:  Length: 11.3 cm. Normal cortical thickness. Upper normal cortical echogenicity. No mass or hydronephrosis.  Left Kidney:  Length: 11.2 cm. Slightly increased cortical echogenicity. Cortical thickness normal. No gross mass or hydronephrosis.  Abdominal aorta:  Normal caliber  Other findings:  Trace free fluid near gallbladder fossa. LEFT pleural effusion noted.  IMPRESSION: Sludge within gallbladder without gallstones or evidence of acute cholecystitis.  Increased LEFT renal cortical echogenicity without gross mass or hydronephrosis, uncertain etiology, with RIGHT kidney demonstrating upper normal cortical echogenicity, question medical renal disease.  LEFT pleural effusion.   Electronically Signed   By: Lavonia Dana M.D.   On: 04/13/2014 11:07    Scheduled Meds: . azithromycin  500 mg Oral Daily  . cefTRIAXone (ROCEPHIN)  IV  1 g Intravenous Q24H  . cloNIDine  0.1 mg Oral TID  . darbepoetin  100 mcg Subcutaneous Q Wed-1800  . feeding supplement (PRO-STAT SUGAR FREE 64)  30 mL Oral BID WC  . FLUoxetine  10 mg Oral Daily  . furosemide  40 mg Oral BID  . hydrALAZINE  50 mg Oral 3 times per day  . insulin aspart  0-9 Units Subcutaneous TID WC  . isosorbide mononitrate  30 mg Oral Daily  . lisinopril  20 mg Oral Daily  . pantoprazole (PROTONIX) IV  40 mg Intravenous Q24H  . Warfarin - Pharmacist Dosing Inpatient   Does not apply Q24H   Continuous Infusions:   Principal Problem:   CHF  (congestive heart failure) Active Problems:   Hypertension   Dyspnea   Chest pain   Acute renal failure    Time spent: 84mins    Dariana Garbett  [facility] Pager [phone]. If 7PM-7AM, please contact night-coverage at [email or web address], password [facility] 04/14/2014, 7:13 PM  LOS: 1 day

## 2014-04-14 NOTE — Consult Note (Signed)
REVIEWED. AGREE. 

## 2014-04-14 NOTE — Consult Note (Signed)
Referring Provider: Kathie Dike, MD Primary Care Physician:  Duwaine Maxin, DO Primary Gastroenterologist:  ? Saw Dr. Penelope Coop during hospitalization in 02/2014  Reason for Consultation:  ?UGI bleed, melena, heme + stool, microcytic anemia  HPI: Joshua [name] is a 63 y.o. male presented from the pain center for further evaluation of left-sided chest pain/shortness of breath. 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 difficile colitis back in March, previously treated extrapulmonary TB, nephrotic syndrome. Has been at [facility] for rehabilitation following prolonged hospital stay, March 8 3 March 23. Also hospitalized for 2 weeks in February for acute on chronic diastolic heart failure, foot ulcer requiring debridement. At that time there was also concern for acute acalculous cholecystitis. HIDA scan was negative. Surgery felt that he did not need a cholecystectomy. Chronic microcytic anemia, Hgb in the 7-8 range for past few months.   Patient was seen by Dr. Penelope Coop during February hospitalization with regards to anemia and iron deficiency, heme positive stool. At that time he was on heparin and Coumadin for bilateral lower extremity DVTs. He had an upper GI series (single contrast), air-contrast barium enema which showed possible small polyp in the mid to distal descending colon.   Chest x-ray on presentation showed atelectasis versus infiltrate in the left lung base and small left effusion. Patient continues to have left-sided chest pain. He feels like his breathing has been fine. Denies any nausea or vomiting. Denies any abdominal pain. He reports he still having 3-4 loose stools daily. Prior to becoming ill in February he may go 3 or 4 days without a bowel movement. He denies any fresh blood  in the stool. States his stools are dark. He is on chronic iron therapy. Denies any dysphagia, heartburn, vomiting. No dysuria. No prior colonoscopy or endoscopy.  C/o 30 pound weight loss.  Prior to Admission medications   Medication Sig Start Date End Date Taking? Authorizing Provider  acetaminophen (TYLENOL) 325 MG tablet Take 650 mg by mouth every 4 (four) hours as needed for mild pain, moderate pain or fever (Over 100 degrees.).   Yes Historical Provider, MD  cloNIDine (CATAPRES) 0.1 MG tablet Take 1 tablet (0.1 mg total) by mouth 3 (three) times daily. 02/11/14  Yes Belkys A Regalado, MD  ferrous sulfate 325 (65 FE) MG tablet Take 1 tablet (325 mg total) by mouth 3 (three) times daily with meals. 01/19/14  Yes Jessee Avers, MD  FLUoxetine (PROZAC) 10 MG capsule Take 1 capsule (10 mg total) by mouth daily. 02/11/14  Yes Belkys A Regalado, MD  furosemide (LASIX) 40 MG tablet Take 40 mg by mouth daily.   Yes Historical Provider, MD  glimepiride (AMARYL) 2 MG tablet Take 2 mg by mouth daily with breakfast.   Yes Historical Provider, MD  hydrALAZINE (APRESOLINE) 50 MG tablet Take 1 tablet (50 mg total) by mouth every 8 (eight) hours. 01/19/14  Yes Jessee Avers, MD  ipratropium-albuterol (DUONEB) 0.5-2.5 (3) MG/3ML SOLN Take 3 mLs by nebulization every 4 (four) hours as needed (For SOB and Wheezing).    Yes Historical Provider, MD  isosorbide mononitrate (IMDUR) 30 MG 24 hr tablet Take 1 tablet (30 mg total) by mouth daily. 01/19/14  Yes Jessee Avers, MD  lisinopril (PRINIVIL,ZESTRIL) 30 MG tablet Take 30 mg by mouth daily.   Yes Historical Provider, MD  potassium chloride SA (K-DUR,KLOR-CON) 20 MEQ tablet Take 20 mEq by mouth daily.   Yes Historical Provider, MD  warfarin (COUMADIN) 4 MG tablet Take 8 mg by mouth daily at 6 PM.   Yes Historical Provider, MD    Current Facility-Administered Medications  Medication Dose Route Frequency Provider Last Rate Last Dose  . acetaminophen (TYLENOL)  tablet 650 mg  650 mg Oral Q6H PRN Kinnie Feil, MD   650 mg at 04/14/14 0346   Or  . acetaminophen (TYLENOL) suppository 650 mg  650 mg Rectal Q6H PRN Kinnie Feil, MD      . azithromycin (ZITHROMAX) tablet 500 mg  500 mg Oral Daily Kathie Dike, MD   500 mg at 04/14/14 0902  . cefTRIAXone (ROCEPHIN) 1 g in dextrose 5 % 50 mL IVPB  1 g Intravenous Q24H Kinnie Feil, MD   1 g at 04/14/14 0543  . cloNIDine (CATAPRES) tablet 0.1 mg  0.1 mg Oral TID Kinnie Feil, MD   0.1 mg at 04/14/14 0804  . darbepoetin (ARANESP) injection 100 mcg  100 mcg Subcutaneous Q Wed-1800 Kinnie Feil, MD      . feeding supplement (PRO-STAT SUGAR FREE 64) liquid 30 mL  30 mL Oral BID WC Kinnie Feil, MD   30 mL at 04/14/14 0803  . FLUoxetine (PROZAC) capsule 10 mg  10 mg Oral Daily Kinnie Feil, MD   10 mg at 04/14/14 0804  . furosemide (LASIX) tablet 40 mg  40 mg Oral BID Kinnie Feil, MD   40 mg at 04/14/14 0805  . hydrALAZINE (APRESOLINE) tablet 50 mg  50 mg Oral 3 times per day Kinnie Feil, MD   50 mg at 04/14/14 0537  . insulin aspart (novoLOG) injection 0-9 Units  0-9 Units Subcutaneous TID WC Kinnie Feil, MD   3 Units at 04/14/14 1134  . ipratropium-albuterol (DUONEB) 0.5-2.5 (3) MG/3ML nebulizer solution 3 mL  3 mL Nebulization Q4H PRN Kinnie Feil, MD      . isosorbide mononitrate (IMDUR) 24 hr tablet 30 mg  30 mg Oral Daily Kinnie Feil, MD   30 mg at 04/14/14 0805  . lisinopril (PRINIVIL,ZESTRIL) tablet 20 mg  20 mg Oral Daily Kinnie Feil, MD   20 mg at 04/14/14 0805  . ondansetron (ZOFRAN) tablet 4 mg  4 mg Oral Q6H PRN Kinnie Feil, MD       Or  . ondansetron (ZOFRAN) injection 4 mg  4 mg Intravenous Q6H PRN Kinnie Feil, MD      . Warfarin - Pharmacist Dosing Inpatient   Does not apply Q24H Kathie Dike, MD        Allergies as of 04/13/2014 - Review Complete 04/13/2014  Allergen Reaction Noted  . Aspirin Itching 01/30/2014    Past  Medical History  Diagnosis Date  . 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 Hypertension 10/14/2012  . Type 2 diabetes mellitus   . Diabetic nephropathy with proteinuria   . 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 Diastolic heart failure   . Cellulitis   . Convulsions   . Iron deficiency anemia   . 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 Anasarca   . Hypoalbuminemia   . Elevated alkaline phosphatase level   . Microcytic anemia   . GI bleed   . Nephrotic syndrome   . Difficulty in walking(719.7)   . Muscle weakness (  generalized)   . Type II or unspecified type diabetes mellitus with renal manifestations, not stated as uncontrolled   . Iron deficiency anemia, unspecified     Past Surgical History  Procedure Laterality Date  . Knee arthroscopy  2010    3 times, right knee  . I&d extremity Left 01/17/2014    Procedure: IRRIGATION AND DEBRIDEMENT  LEFT FOOT;  Surgeon: Wylene Simmer, MD;  Location: [place];  Service: Orthopedics;  Laterality: Left;    Family History  Problem Relation Age of Onset  . GI problems      none    History   Social History  . Marital Status: Single    Spouse Name: N/A    Number of Children: 5  . 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 Smoking status: Former Smoker -- 20 years    Types: Cigarettes    Quit date: 11/25/1992  . Smokeless tobacco: Never Used  . Alcohol Use: No     Comment: Rarely.  . Drug Use: No  . Sexual Activity: Not on file     Comment: 09/2012: > 15 partners in past 3 years, inconsistent condom use   Other Topics Concern  . Not on file   Social History Narrative   Has a masters degree in education     ROS:  General: see hpi Eyes: Negative for vision changes.  ENT: Negative for hoarseness, difficulty swallowing , nasal congestion. CV: Negative for  angina, palpitations,  peripheral edema. See hpi Respiratory:  Negative for dyspnea at rest, cough, sputum, wheezing. See hpi GI: See history of present illness. GU:  Negative for dysuria, hematuria, urinary incontinence, urinary frequency, nocturnal urination.  MS: Negative for joint pain, low back pain.  Derm: Negative for rash or itching.  Neuro: Negative for weakness, abnormal sensation, seizure, frequent headaches, memory loss, confusion.  Psych: Negative for anxiety, depression, suicidal ideation, hallucinations.  Endo: see hpi Heme: Negative for bruising or bleeding. Allergy: Negative for rash or hives.       Physical Examination: Vital signs in last 24 hours: Temp:  [97.5 F (36.4 C)-98.1 F (36.7 C)] 98.1 F (36.7 C) (05/21 0534) Pulse Rate:  [76-82] 76 (05/21 0534) Resp:  [17-20] 17 (05/21 0534) BP: (108-160)/(58-75) 108/62 mmHg (05/21 0804) SpO2:  [98 %-100 %] 100 % (05/21 0534) Weight:  [128 lb 14.4 oz (58.469 kg)] 128 lb 14.4 oz (58.469 kg) (05/21 0541) Last BM Date: 04/14/14  General: thin male, in no acute distress.  Head: Normocephalic, atraumatic.   Eyes: Conjunctiva pale, no icterus. Mouth: Oropharyngeal mucosa moist and pink , no lesions erythema or exudate. Neck: Supple without thyromegaly, masses, or lymphadenopathy.  Lungs: Clear to auscultation bilaterally.  Heart: Regular rate and rhythm, no murmurs rubs or gallops.  Abdomen: Bowel sounds are normal, nontender, nondistended, no hepatosplenomegaly or masses, no abdominal bruits or    hernia , no rebound or guarding.   Rectal: not performed Extremities: No lower extremity edema, clubbing, deformity. Chronic venous stasis changes Neuro: Alert and oriented x 4 , grossly normal neurologically.  Skin: Warm and dry, no rash or jaundice.   Psych: Alert and cooperative, normal mood and affect.        Intake/Output from previous day: 05/20 0701 - 05/21 0700 In: 730 [P.O.:480; IV Piggyback:250] Out: [id number] [Urine:1650] Intake/Output this shift: Total I/O In: 240  [P.O.:240] Out: -   Lab Results: CBC  Recent Labs  04/13/14 0321 04/14/14 0530  WBC 8.1 5.0  HGB 10.3* 9.1*  HCT 32.1* 27.9*  MCV 79.7 79.0  PLT 415* 373   Lab Results  Component Value Date   IRON 30* 02/07/2014   TIBC 285 02/07/2014   FERRITIN 50 02/07/2014    BMET  Recent Labs  04/13/14 0321 04/14/14 0530  NA 133* 134*  K 4.8 4.7  CL 101 103  CO2 19 19  GLUCOSE 226* 135*  BUN 100* 88*  CREATININE 1.95* 1.80*  CALCIUM 9.0 8.8   LFT  Recent Labs  04/13/14 0321 04/14/14 0530  BILITOT 0.2* 0.2*  ALKPHOS 355* 279*  AST 43* 28  ALT 69* 51  PROT 7.9 7.2  ALBUMIN 2.4* 2.1*   HCV RNA negative 12/2013. HCV Ab was reactive.  Hep B surf ag: negative, Hep A IgM NR, Hep B C IgM NR Lipase  Recent Labs  04/13/14 0731  LIPASE 131*    PT/INR  Recent Labs  04/13/14 0321 04/14/14 0530  LABPROT 27.7* 32.7*  INR 2.69* 3.35*      Imaging Studies: Dg Chest 2 View  04/13/2014   CLINICAL DATA:  CHEST PAIN  EXAM: CHEST  2 VIEW  COMPARISON:  DG CHEST 1V PORT dated 02/06/2014  FINDINGS: Low lung volumes. Cardiac silhouette is enlarged. There is prominence of the interstitial markings and peribronchial cuffing. Linear increased density right lung base blunting of the left costophrenic angle. Ill-defined density left lung base.  IMPRESSION: Mild pulmonary edema.  Scarring versus atelectasis right lung base  Atelectasis versus infiltrate left lung base and small left effusion.   Electronically Signed   By: Margaree Mackintosh M.D.   On: 04/13/2014 03:45   US Abdomen Complete  04/13/2014   CLINICAL DATA:  Elevated alkaline phosphatase  EXAM: ULTRASOUND ABDOMEN COMPLETE  COMPARISON:  01/31/2014  FINDINGS: Gallbladder:  Small amount of dependent sludge within gallbladder. No shadowing calculi, gallbladder wall thickening or sonographic Murphy sign.  Common bile duct:  Diameter: Upper normal caliber 6 mm diameter, not significantly changed  Liver:  Normal appearance  IVC:  Normal  appearance  Pancreas:  Normal appearance  Spleen:  Normal appearance, 6.4 cm length  Right Kidney:  Length: 11.3 cm. Normal cortical thickness. Upper normal cortical echogenicity. No mass or hydronephrosis.  Left Kidney:  Length: 11.2 cm. Slightly increased cortical echogenicity. Cortical thickness normal. No gross mass or hydronephrosis.  Abdominal aorta:  Normal caliber  Other findings:  Trace free fluid near gallbladder fossa. LEFT pleural effusion noted.  IMPRESSION: Sludge within gallbladder without gallstones or evidence of acute cholecystitis.  Increased LEFT renal cortical echogenicity without gross mass or hydronephrosis, uncertain etiology, with RIGHT kidney demonstrating upper normal cortical echogenicity, question medical renal disease.  LEFT pleural effusion.   Electronically Signed   By: Lavonia Dana M.D.   On: 04/13/2014 11:07  [4 week]  ACBE 02/2014: IMPRESSION:  Questionable soft soft polyp at the mid to distal descending colon  seen on a single view.  Otherwise negative exam.  Colonoscopy recommended.   Single contrast UGI 02/2014: IMPRESSION:  Mild age-related esophageal dysmotility.  No gross acute upper GI at abnormalities identified.   Impression: 63 year old gentleman who presented with left-sided chest pain, shortness of breath. Chest x-ray with question of possible pneumonia. Patient started on antibiotic therapy. History of chronic renal insufficiency with worsening creatinine during this admission as well as markedly elevated BUN. He has had black stools reported, discussed with nursing staff who witnessed the stool this  afternoon. Heme positive. Patient is on chronic iron for microcytic anemia detected several months ago during hospitalizations. Hemoglobin has been stable at or near his baseline. Seen last month by Dr. Penelope Coop during hospitalization after cardiac arrest, barium studies including upper GI series and barium enema performed to evaluate anemia due to need for  anticoagulation. No significant findings. He is also noted to have elevated alkaline phosphatase and mildly elevated transaminases. H/O ?acalculous cholecystitis previously but no surgery recommended. 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 Current abd u/s unrevealing except for sludge. Possibly passed sludge as cause of elevated ast/alt/lipase.  Discussed with Dr. Oneida Alar. Monitor for active bleeding. Consider EGD if active bleeding and when INR less than 2. Consider holding coumadin given concern for GI bleeding. Once INR less than 2, consider heparin drip in preparation for EGD.  Plan: 1. C.diff pcr. 2. PPI. 3. Recheck LFTs, CBC, lipase in AM 4. Monitor for active bleeding. Supportive care.  5. Would recommend holding coumadin until INR less than 2, then start heparin drip in preparation for possible EGD.   We would like to thank you for the opportunity to participate in the care of Joshua [name].   LOS: 1 day   Mahala Menghini  04/14/2014, 3:11 PM

## 2014-04-14 NOTE — Progress Notes (Signed)
Patient spilled coca-cola in his bed, i told him i had to change his sheets he refused and asked for a towel. I said its too wet and too much I have to chnge it and i went and got a new clean shhet and towels and when i got back in the room he again refused and did not want to get up or for me to change his bed, told me to put a towel over the wet spot instead. William Hamburger RN

## 2014-04-15 LAB — HEPATIC FUNCTION PANEL
ALT: 49 U/L (ref 0–53)
AST: 31 U/L (ref 0–37)
Albumin: 2.1 g/dL — ABNORMAL LOW (ref 3.5–5.2)
Alkaline Phosphatase: 272 U/L — ABNORMAL HIGH (ref 39–117)
BILIRUBIN TOTAL: 0.2 mg/dL — AB (ref 0.3–1.2)
Total Protein: 7.2 g/dL (ref 6.0–8.3)

## 2014-04-15 LAB — CBC
HCT: 27.4 % — ABNORMAL LOW (ref 39.0–52.0)
Hemoglobin: 8.8 g/dL — ABNORMAL LOW (ref 13.0–17.0)
MCH: 25.2 pg — AB (ref 26.0–34.0)
MCHC: 32.1 g/dL (ref 30.0–36.0)
MCV: 78.5 fL (ref 78.0–100.0)
Platelets: 397 10*3/uL (ref 150–400)
RBC: 3.49 MIL/uL — AB (ref 4.22–5.81)
RDW: 19.3 % — AB (ref 11.5–15.5)
WBC: 5.9 10*3/uL (ref 4.0–10.5)

## 2014-04-15 LAB — GLUCOSE, CAPILLARY
GLUCOSE-CAPILLARY: 131 mg/dL — AB (ref 70–99)
GLUCOSE-CAPILLARY: 182 mg/dL — AB (ref 70–99)
Glucose-Capillary: 180 mg/dL — ABNORMAL HIGH (ref 70–99)
Glucose-Capillary: 135 mg/dL — ABNORMAL HIGH (ref 70–99)
Glucose-Capillary: 184 mg/dL — ABNORMAL HIGH (ref 70–99)

## 2014-04-15 LAB — BASIC METABOLIC PANEL
BUN: 88 mg/dL — ABNORMAL HIGH (ref 6–23)
CO2: 18 meq/L — AB (ref 19–32)
Calcium: 8.8 mg/dL (ref 8.4–10.5)
Chloride: 103 mEq/L (ref 96–112)
Creatinine, Ser: 1.89 mg/dL — ABNORMAL HIGH (ref 0.50–1.35)
GFR calc Af Amer: 42 mL/min — ABNORMAL LOW (ref >90)
GFR calc non Af Amer: 36 mL/min — ABNORMAL LOW (ref >90)
Glucose, Bld: 121 mg/dL — ABNORMAL HIGH (ref 70–99)
Potassium: 4.2 mEq/L (ref 3.7–5.3)
SODIUM: 134 meq/L — AB (ref 137–147)

## 2014-04-15 LAB — HEMOGLOBIN AND HEMATOCRIT, BLOOD
HCT: 29.3 % — ABNORMAL LOW (ref 39.0–52.0)
HEMOGLOBIN: 9.5 g/dL — AB (ref 13.0–17.0)

## 2014-04-15 LAB — PROTIME-INR
INR: 3.84 — ABNORMAL HIGH (ref 0.00–1.49)
Prothrombin Time: 36.3 seconds — ABNORMAL HIGH (ref 11.6–15.2)

## 2014-04-15 LAB — LIPASE, BLOOD: Lipase: 95 U/L — ABNORMAL HIGH (ref 11–59)

## 2014-04-15 MED ORDER — VANCOMYCIN 50 MG/ML ORAL SOLUTION
125.0000 mg | Freq: Four times a day (QID) | ORAL | Status: DC
  Administered 2014-04-15 – 2014-04-18 (×14): 125 mg via ORAL
  Filled 2014-04-15 (×23): qty 2.5

## 2014-04-15 MED ORDER — RANITIDINE HCL 150 MG/10ML PO SYRP
150.0000 mg | ORAL_SOLUTION | Freq: Two times a day (BID) | ORAL | Status: DC
  Administered 2014-04-15 – 2014-04-16 (×3): 150 mg via ORAL
  Filled 2014-04-15 (×7): qty 10

## 2014-04-15 MED ORDER — SODIUM CHLORIDE 0.9 % IV SOLN
INTRAVENOUS | Status: DC
  Administered 2014-04-15 – 2014-04-18 (×3): via INTRAVENOUS

## 2014-04-15 NOTE — Progress Notes (Signed)
[place] for Coumadin Indication: h/o DVT  Allergies  Allergen Reactions  . Aspirin Itching   Patient Measurements: Height: 5\' 5"  (165.1 cm) Weight: 128 lb 14.4 oz (58.469 kg) IBW/kg (Calculated) : 61.5  Vital Signs: Temp: 97.3 F (36.3 C) (05/22 0658) Temp src: Oral (05/22 0658) BP: 98/50 mmHg (05/22 0658) Pulse Rate: 75 (05/22 0658)  Labs:  Recent Labs  04/13/14 0321 04/13/14 0729 04/13/14 1255 04/13/14 1907 04/14/14 0530 04/15/14 0522  HGB 10.3*  --   --   --  9.1* 8.8*  HCT 32.1*  --   --   --  27.9* 27.4*  PLT 415*  --   --   --  373 397  LABPROT 27.7*  --   --   --  32.7* 36.3*  INR 2.69*  --   --   --  3.35* 3.84*  CREATININE 1.95*  --   --   --  1.80* 1.89*  TROPONINI <0.30 <0.30 <0.30 <0.30  --   --    Estimated Creatinine Clearance: 33.5 ml/min (by C-G formula based on Cr of 1.89).  Medical History: Past Medical History  Diagnosis Date  . 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 Hypertension 10/14/2012  . Type 2 diabetes mellitus   . Diabetic nephropathy with proteinuria   . 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 Diastolic heart failure   . Cellulitis   . Convulsions   . Iron deficiency anemia   . 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 Anasarca   . Hypoalbuminemia   . Elevated alkaline phosphatase level   . Microcytic anemia   . GI bleed   . Nephrotic syndrome   . Difficulty in walking(719.7)   . Muscle weakness (generalized)   . Type II or unspecified type diabetes mellitus with renal manifestations, not stated as uncontrolled   . Iron deficiency anemia, unspecified    Medications:  Prescriptions prior to admission  Medication Sig Dispense Refill  . acetaminophen (TYLENOL) 325 MG tablet Take 650 mg by mouth every 4 (four) hours as needed for mild pain, moderate pain or fever (Over 100 degrees.).      Marland Kitchen cloNIDine (CATAPRES) 0.1 MG tablet Take 1 tablet (0.1 mg  total) by mouth 3 (three) times daily.  60 tablet  11  . ferrous sulfate 325 (65 FE) MG tablet Take 1 tablet (325 mg total) by mouth 3 (three) times daily with meals.  90 tablet  3  . FLUoxetine (PROZAC) 10 MG capsule Take 1 capsule (10 mg total) by mouth daily.  30 capsule  3  . furosemide (LASIX) 40 MG tablet Take 40 mg by mouth daily.      Marland Kitchen glimepiride (AMARYL) 2 MG tablet Take 2 mg by mouth daily with breakfast.      . hydrALAZINE (APRESOLINE) 50 MG tablet Take 1 tablet (50 mg total) by mouth every 8 (eight) hours.  90 tablet  0  . ipratropium-albuterol (DUONEB) 0.5-2.5 (3) MG/3ML SOLN Take 3 mLs by nebulization every 4 (four) hours as needed (For SOB and Wheezing).       . isosorbide mononitrate (IMDUR) 30 MG 24 hr tablet Take 1 tablet (30 mg total) by mouth daily.  30 tablet  0  . lisinopril (PRINIVIL,ZESTRIL) 30 MG tablet Take 30 mg by mouth daily.      . potassium chloride SA (K-DUR,KLOR-CON) 20 MEQ tablet Take 20 mEq by mouth daily.      Marland Kitchen  warfarin (COUMADIN) 4 MG tablet Take 8 mg by mouth daily at 6 PM.       Assessment: 62yo male on chronic Coumadin PTA for h/o DVT.  Home Coumadin dose is listed above.  INR is therapeutic on admission, but has trended >3 today.  Pt was started on Zithromax which can interact with Coumadin to increase INR. No bleeding noted.   Goal of Therapy:  INR 2-3   Plan:  Hold Coumadin today  Daily INR   Joshua [name] 04/15/2014,8:19 AM

## 2014-04-15 NOTE — Clinical Social Work Note (Signed)
CSW updated P[facility] on pt. Aware C.Diff positive. Discussed with MD- possible d/c Monday. Facility notified and agreeable to return.  Benay Pike, [place]

## 2014-04-15 NOTE — Progress Notes (Signed)
Subjective:  Patient has loose BM today. He did not look at it. Nursing staff reported large loose black stool. Denies abdominal pain, vomiting. No chest pain currently.   Objective: Vital signs in last 24 hours: Temp:  [97.3 F (36.3 C)-98.4 F (36.9 C)] 97.3 F (36.3 C) (05/22 0658) Pulse Rate:  [69-76] 75 (05/22 0658) Resp:  [16-20] 18 (05/22 0658) BP: (98-118)/(50-63) 98/50 mmHg (05/22 0658) SpO2:  [95 %-100 %] 97 % (05/22 0658) Last BM Date: 04/14/14 General:   Alert,  Well-developed, well-nourished, pleasant and cooperative in NAD Head:  Normocephalic and atraumatic. Eyes:  Sclera clear, no icterus.   Abdomen:  Soft, nontender and nondistended.   Normal bowel sounds, without guarding, and without rebound.   Extremities:  Without clubbing, deformity or edema. Neurologic:  Alert and  oriented x4;  grossly normal neurologically. Skin:  Intact without significant lesions or rashes. Psych:  Alert and cooperative. Normal mood and affect.  Intake/Output from previous day: 05/21 0701 - 05/22 0700 In: 480 [P.O.:480] Out: 300 [Urine:300] Intake/Output this shift:    Lab Results: CBC  Recent Labs  04/13/14 0321 04/14/14 0530 04/15/14 0522  WBC 8.1 5.0 5.9  HGB 10.3* 9.1* 8.8*  HCT 32.1* 27.9* 27.4*  MCV 79.7 79.0 78.5  PLT 415* 373 397   BMET  Recent Labs  04/13/14 0321 04/14/14 0530 04/15/14 0522  NA 133* 134* 134*  K 4.8 4.7 4.2  CL 101 103 103  CO2 19 19 18*  GLUCOSE 226* 135* 121*  BUN 100* 88* 88*  CREATININE 1.95* 1.80* 1.89*  CALCIUM 9.0 8.8 8.8   LFTs  Recent Labs  04/13/14 0321 04/14/14 0530 04/15/14 0522  BILITOT 0.2* 0.2* 0.2*  BILIDIR  --   --  <0.2  IBILI  --   --  NOT CALCULATED  ALKPHOS 355* 279* 272*  AST 43* 28 31  ALT 69* 51 49  PROT 7.9 7.2 7.2  ALBUMIN 2.4* 2.1* 2.1*    Recent Labs  04/13/14 0731 04/15/14 0522  LIPASE 131* 95*   PT/INR  Recent Labs  04/13/14 0321 04/14/14 0530 04/15/14 0522  LABPROT 27.7* 32.7*  36.3*  INR 2.69* 3.35* 3.84*      Imaging Studies: Dg Chest 2 View  04/13/2014   CLINICAL DATA:  CHEST PAIN  EXAM: CHEST  2 VIEW  COMPARISON:  DG CHEST 1V PORT dated 02/06/2014  FINDINGS: Low lung volumes. Cardiac silhouette is enlarged. There is prominence of the interstitial markings and peribronchial cuffing. Linear increased density right lung base blunting of the left costophrenic angle. Ill-defined density left lung base.  IMPRESSION: Mild pulmonary edema.  Scarring versus atelectasis right lung base  Atelectasis versus infiltrate left lung base and small left effusion.   Electronically Signed   By: Margaree Mackintosh M.D.   On: 04/13/2014 03:45   US Abdomen Complete  04/13/2014   CLINICAL DATA:  Elevated alkaline phosphatase  EXAM: ULTRASOUND ABDOMEN COMPLETE  COMPARISON:  01/31/2014  FINDINGS: Gallbladder:  Small amount of dependent sludge within gallbladder. No shadowing calculi, gallbladder wall thickening or sonographic Murphy sign.  Common bile duct:  Diameter: Upper normal caliber 6 mm diameter, not significantly changed  Liver:  Normal appearance  IVC:  Normal appearance  Pancreas:  Normal appearance  Spleen:  Normal appearance, 6.4 cm length  Right Kidney:  Length: 11.3 cm. Normal cortical thickness. Upper normal cortical echogenicity. No mass or hydronephrosis.  Left Kidney:  Length: 11.2 cm. Slightly increased cortical echogenicity. Cortical thickness normal.  No gross mass or hydronephrosis.  Abdominal aorta:  Normal caliber  Other findings:  Trace free fluid near gallbladder fossa. LEFT pleural effusion noted.  IMPRESSION: Sludge within gallbladder without gallstones or evidence of acute cholecystitis.  Increased LEFT renal cortical echogenicity without gross mass or hydronephrosis, uncertain etiology, with RIGHT kidney demonstrating upper normal cortical echogenicity, question medical renal disease.  LEFT pleural effusion.   Electronically Signed   By: Lavonia Dana M.D.   On: 04/13/2014 11:07   [2 weeks]   Assessment: 63 year old gentleman who presented with left-sided chest pain, shortness of breath. Chest x-ray with question of possible pneumonia. Patient started on antibiotic therapy. History of chronic renal insufficiency with worsening creatinine during this admission as well as markedly elevated BUN. He has had black stools reported, discussed with nursing staff who witnessed the stool this afternoon. Heme positive. Patient is on chronic iron for microcytic anemia detected several months ago during hospitalizations. Hemoglobin has been stable at or near his baseline.   Seen last month by Dr. Penelope Coop during hospitalization after cardiac arrest, barium studies including upper GI series and barium enema performed to evaluate anemia due to need for anticoagulation. No significant findings.  He is also noted to have elevated alkaline phosphatase and mildly elevated transaminases. H/O ?acalculous cholecystitis previously but no surgery recommended. 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 Current abd u/s unrevealing except for sludge. Possibly passed sludge as cause of elevated ast/alt/lipase. Lipase improved. alkphos remained elevated.   INR still supratherapeutic.  C.Diff positive  Plan: 1. Vancomycin as planned. 2. Switch PPI to H2blocker unless significant drop in Hgb, then benefits of PPI for GI bleed outweigh risks of refractory C.Diff.  3. Continue to monitor for significant bleeding. 4. AMA. 5. Hold coumadin. ?need to correct coagulapathy especially if Hgb drops significant. Consider EGD at that time.  6. Check H/H at 10.    LOS: 2 days   Mahala Menghini  04/15/2014, 8:22 AM   Addendum: discussed with Pharmacy, [facility]. If there are plans to reverse anticoagulation for bleeding, he would advise placing heparin per pharmacy order with instructions to bridge if necessary.   If planning to let INR drift down to 2 for EGD, then no heparin needed. No coumadin to be given  until EGD complete.   As of right now, Hgb is stable, so no plans for reversing his coagulopathy.

## 2014-04-15 NOTE — Progress Notes (Addendum)
[facility] PROGRESS NOTE  Joshua [name] [id number] DOB: 01/06/1951 DOA: 04/13/2014 PCP: Duwaine Maxin, DO  Assessment/Plan: 1. Chest pain. Atypical. He has ruled out for this he negative cardiac markers. Recent left heart cath 01/2014 which indicated mild nonobstructive disease. His LV function is normal by echocardiogram. 2. Possible pneumonia. Patient is on antibiotics. Continue bronchodilators and supportive oxygen. 3. Acute renal failure superimposed on chronic kidney disease stage III. He is currently on oral Lasix. He does not appear to be significantly volume overloaded.  Will hold lasix and try gentle hydration. We'll continue to monitor urine output and renal function. 4. Elevated BUN. Possibly related to renal failure, although he does have heme positive stools. The patient is anticoagulated on Coumadin. There is concern that he may be having GI bleeding. Appreciate GI consultation. Will hold Coumadin for now, may need possible endoscopy. 5. History of DVT on Coumadin. Anticoagulation currently on hold due to concern for bleeding. 6. Chronic diastolic congestive heart failure. Appears to be compensated 7. Anemia. Likely related to chronic disease. Hemoglobin appears to be near baseline at this time 8. Recurrent Clostridium difficile infection. Patient's stool tested positive for C. difficile in 01/2014. He was treated with Flagyl. Since this is a recurring C. difficile infection, he'll be treated with a course of vancomycin.  Code Status: Full code Family Communication: Discussed with patient Disposition Plan: Likely discharge back to [facility]   Consultants:  Gastroenterology  Procedures:    Antibiotics:  Rocephin 5/20>>  Azithromycin 5/20>>  Vancomycin 5/22>>  HPI/Subjective: Continue to have diarrhea, no shortness of breath  Objective: Filed Vitals:   04/15/14 1721  BP: 118/66  Pulse:   Temp:   Resp:     Intake/Output Summary (Last 24  hours) at 04/15/14 1853 Last data filed at 04/15/14 1421  Gross per 24 hour  Intake    720 ml  Output    900 ml  Net   -180 ml   Filed Weights   04/13/14 0206 04/14/14 0541 04/15/14 1421  Weight: 60.924 kg (134 lb 5 oz) 58.469 kg (128 lb 14.4 oz) 59.966 kg (132 lb 3.2 oz)    Exam:   General:  NAD  Cardiovascular: S1, S2 RRR  Respiratory: diminished breath sounds at bases but otherwise clear  Abdomen: soft, nt, nd, bs+  Musculoskeletal: no edema in LE b/l, chronic stasis changes   Data Reviewed: Basic Metabolic Panel:  Recent Labs Lab 04/13/14 0321 04/14/14 0530 04/15/14 0522  NA 133* 134* 134*  K 4.8 4.7 4.2  CL 101 103 103  CO2 19 19 18*  GLUCOSE 226* 135* 121*  BUN 100* 88* 88*  CREATININE 1.95* 1.80* 1.89*  CALCIUM 9.0 8.8 8.8   Liver Function Tests:  Recent Labs Lab 04/13/14 0321 04/14/14 0530 04/15/14 0522  AST 43* 28 31  ALT 69* 51 49  ALKPHOS 355* 279* 272*  BILITOT 0.2* 0.2* 0.2*  PROT 7.9 7.2 7.2  ALBUMIN 2.4* 2.1* 2.1*    Recent Labs Lab 04/13/14 0731 04/15/14 0522  LIPASE 131* 95*   No results found for this basename: AMMONIA,  in the last 168 hours CBC:  Recent Labs Lab 04/13/14 0321 04/14/14 0530 04/15/14 0522 04/15/14 1121  WBC 8.1 5.0 5.9  --   NEUTROABS 5.4  --   --   --   HGB 10.3* 9.1* 8.8* 9.5*  HCT 32.1* 27.9* 27.4* 29.3*  MCV 79.7 79.0 78.5  --   PLT 415* 373 397  --  Cardiac Enzymes:  Recent Labs Lab 04/13/14 0321 04/13/14 0729 04/13/14 1255 04/13/14 1907  TROPONINI <0.30 <0.30 <0.30 <0.30   BNP (last 3 results)  Recent Labs  01/30/14 2028 04/13/14 0321  PROBNP 9165.0* 1440.0*   CBG:  Recent Labs Lab 04/14/14 1710 04/14/14 2350 04/15/14 0749 04/15/14 1133 04/15/14 1643  GLUCAP 98 131* 182* 180* 135*    Recent Results (from the past 240 hour(s))  CLOSTRIDIUM DIFFICILE BY PCR     Status: Abnormal   Collection Time    04/14/14  5:39 PM      Result Value Ref Range Status   C difficile  by pcr POSITIVE (*) NEGATIVE Final   Comment: CRITICAL RESULT CALLED TO, READ BACK BY AND VERIFIED WITH:     FOOTE,L. AT 1935 ON 04/14/2014 BY BAUGHAM,M.     Studies: No results found.  Scheduled Meds: . azithromycin  500 mg Oral Daily  . cefTRIAXone (ROCEPHIN)  IV  1 g Intravenous Q24H  . cloNIDine  0.1 mg Oral TID  . darbepoetin  100 mcg Subcutaneous Q Wed-1800  . feeding supplement (PRO-STAT SUGAR FREE 64)  30 mL Oral BID WC  . FLUoxetine  10 mg Oral Daily  . furosemide  40 mg Oral BID  . hydrALAZINE  50 mg Oral 3 times per day  . insulin aspart  0-9 Units Subcutaneous TID WC  . isosorbide mononitrate  30 mg Oral Daily  . lisinopril  20 mg Oral Daily  . ranitidine  150 mg Oral BID  . vancomycin  125 mg Oral 4 times per day  . Warfarin - Pharmacist Dosing Inpatient   Does not apply Q24H   Continuous Infusions:   Principal Problem:   CHF (congestive heart failure) Active Problems:   Hypertension   Dyspnea   C. difficile colitis   Chest pain   Acute renal failure    Time spent: 72mins    Jehanzeb Memon  [facility] Pager [phone]. If 7PM-7AM, please contact night-coverage at [email or web address], password [facility] 04/15/2014, 6:53 PM  LOS: 2 days

## 2014-04-16 LAB — CBC
HEMATOCRIT: 28.7 % — AB (ref 39.0–52.0)
Hemoglobin: 9.3 g/dL — ABNORMAL LOW (ref 13.0–17.0)
MCH: 25.4 pg — ABNORMAL LOW (ref 26.0–34.0)
MCHC: 32.4 g/dL (ref 30.0–36.0)
MCV: 78.4 fL (ref 78.0–100.0)
Platelets: 375 10*3/uL (ref 150–400)
RBC: 3.66 MIL/uL — AB (ref 4.22–5.81)
RDW: 18.7 % — ABNORMAL HIGH (ref 11.5–15.5)
WBC: 6.5 10*3/uL (ref 4.0–10.5)

## 2014-04-16 LAB — GLUCOSE, CAPILLARY
GLUCOSE-CAPILLARY: 146 mg/dL — AB (ref 70–99)
Glucose-Capillary: 330 mg/dL — ABNORMAL HIGH (ref 70–99)
Glucose-Capillary: 62 mg/dL — ABNORMAL LOW (ref 70–99)
Glucose-Capillary: 107 mg/dL — ABNORMAL HIGH (ref 70–99)
Glucose-Capillary: 107 mg/dL — ABNORMAL HIGH (ref 70–99)

## 2014-04-16 LAB — BASIC METABOLIC PANEL
BUN: 69 mg/dL — ABNORMAL HIGH (ref 6–23)
CHLORIDE: 103 meq/L (ref 96–112)
CO2: 19 meq/L (ref 19–32)
Calcium: 8.8 mg/dL (ref 8.4–10.5)
Creatinine, Ser: 1.47 mg/dL — ABNORMAL HIGH (ref 0.50–1.35)
GFR calc non Af Amer: 49 mL/min — ABNORMAL LOW (ref >90)
GFR, EST AFRICAN AMERICAN: 57 mL/min — AB (ref >90)
Glucose, Bld: 181 mg/dL — ABNORMAL HIGH (ref 70–99)
Potassium: 3.9 mEq/L (ref 3.7–5.3)
SODIUM: 135 meq/L — AB (ref 137–147)

## 2014-04-16 LAB — PROTIME-INR
INR: 3.24 — AB (ref 0.00–1.49)
PROTHROMBIN TIME: 31.9 s — AB (ref 11.6–15.2)

## 2014-04-16 NOTE — Progress Notes (Signed)
REVIEWED.  

## 2014-04-16 NOTE — Progress Notes (Signed)
Hypoglycemic Event  CBG: 62  Treatment: 15 GM carbohydrate snack  Symptoms: None  Follow-up CBG: Time:  107 CBG Result:  1730  Possible Reasons for Event: decreased/inadequate meal intake  Comments/MD notified:    Verdis Prime  Remember to initiate Hypoglycemia Order Set & complete

## 2014-04-16 NOTE — Progress Notes (Signed)
[facility] PROGRESS NOTE  Joshua [name] [id number] DOB: 08-05-1951 DOA: 04/13/2014 PCP: Duwaine Maxin, DO  Assessment/Plan: 1. Chest pain. Atypical. He has ruled out for this he negative cardiac markers. Recent left heart cath 01/2014 which indicated mild nonobstructive disease. His LV function is normal by echocardiogram. 2. Possible pneumonia. Patient is on antibiotics. Continue bronchodilators and supportive oxygen. Will d/c antibiotics after tomorrow dose. 3. Acute renal failure superimposed on chronic kidney disease stage III. Possibly related to volume depletion.  He has been started on IV fluids and renal function appears to be improving.  Continue current treatments.. 4. Elevated BUN. Possibly related to renal failure, although he does have heme positive stools. The patient is anticoagulated on Coumadin. There is concern that he may be having GI bleeding. Appreciate GI consultation. Will hold Coumadin for now, may need possible endoscopy. 5. History of DVT on Coumadin. Anticoagulation currently on hold due to concern for bleeding. Waiting for INR to drift down to consider endoscopy. 6. Chronic diastolic congestive heart failure. Appears to be compensated 7. Anemia. Likely related to chronic disease. Hemoglobin appears to be near baseline at this time 8. Recurrent Clostridium difficile infection. Patient's stool tested positive for C. difficile in 01/2014. He was treated with Flagyl. Since this is a recurring C. difficile infection, he'll be treated with a course of vancomycin.  Code Status: Full code Family Communication: Discussed with patient Disposition Plan: Likely discharge back to [facility]   Consultants:  Gastroenterology  Procedures:    Antibiotics:  Rocephin 5/20>>  Azithromycin 5/20>>  Vancomycin 5/22>>  HPI/Subjective: No shortness of breath or chest pain.  Reports loose stools, but feels this is related to his liquid  diet  Objective: Filed Vitals:   04/16/14 1526  BP: 129/70  Pulse: 72  Temp: 98.1 F (36.7 C)  Resp: 20    Intake/Output Summary (Last 24 hours) at 04/16/14 1812 Last data filed at 04/16/14 1806  Gross per 24 hour  Intake 1803.75 ml  Output   1500 ml  Net 303.75 ml   Filed Weights   04/14/14 0541 04/15/14 1421 04/16/14 0633  Weight: 58.469 kg (128 lb 14.4 oz) 59.966 kg (132 lb 3.2 oz) 58.4 kg (128 lb 12 oz)    Exam:   General:  NAD  Cardiovascular: S1, S2 RRR  Respiratory: diminished breath sounds at bases but otherwise clear  Abdomen: soft, nt, nd, bs+  Musculoskeletal: no edema in LE b/l, chronic stasis changes   Data Reviewed: Basic Metabolic Panel:  Recent Labs Lab 04/13/14 0321 04/14/14 0530 04/15/14 0522 04/16/14 0632  NA 133* 134* 134* 135*  K 4.8 4.7 4.2 3.9  CL 101 103 103 103  CO2 19 19 18* 19  GLUCOSE 226* 135* 121* 181*  BUN 100* 88* 88* 69*  CREATININE 1.95* 1.80* 1.89* 1.47*  CALCIUM 9.0 8.8 8.8 8.8   Liver Function Tests:  Recent Labs Lab 04/13/14 0321 04/14/14 0530 04/15/14 0522  AST 43* 28 31  ALT 69* 51 49  ALKPHOS 355* 279* 272*  BILITOT 0.2* 0.2* 0.2*  PROT 7.9 7.2 7.2  ALBUMIN 2.4* 2.1* 2.1*    Recent Labs Lab 04/13/14 0731 04/15/14 0522  LIPASE 131* 95*   No results found for this basename: AMMONIA,  in the last 168 hours CBC:  Recent Labs Lab 04/13/14 0321 04/14/14 0530 04/15/14 0522 04/15/14 1121 04/16/14 0632  WBC 8.1 5.0 5.9  --  6.5  NEUTROABS 5.4  --   --   --   --  HGB 10.3* 9.1* 8.8* 9.5* 9.3*  HCT 32.1* 27.9* 27.4* 29.3* 28.7*  MCV 79.7 79.0 78.5  --  78.4  PLT 415* 373 397  --  375   Cardiac Enzymes:  Recent Labs Lab 04/13/14 0321 04/13/14 0729 04/13/14 1255 04/13/14 1907  TROPONINI <0.30 <0.30 <0.30 <0.30   BNP (last 3 results)  Recent Labs  01/30/14 2028 04/13/14 0321  PROBNP 9165.0* 1440.0*   CBG:  Recent Labs Lab 04/15/14 2203 04/16/14 0758 04/16/14 1059  04/16/14 1635 04/16/14 1730  GLUCAP 184* 146* 330* 62* 107*    Recent Results (from the past 240 hour(s))  CLOSTRIDIUM DIFFICILE BY PCR     Status: Abnormal   Collection Time    04/14/14  5:39 PM      Result Value Ref Range Status   C difficile by pcr POSITIVE (*) NEGATIVE Final   Comment: CRITICAL RESULT CALLED TO, READ BACK BY AND VERIFIED WITH:     FOOTE,L. AT 1935 ON 04/14/2014 BY BAUGHAM,M.     Studies: No results found.  Scheduled Meds: . azithromycin  500 mg Oral Daily  . cefTRIAXone (ROCEPHIN)  IV  1 g Intravenous Q24H  . cloNIDine  0.1 mg Oral TID  . darbepoetin  100 mcg Subcutaneous Q Wed-1800  . feeding supplement (PRO-STAT SUGAR FREE 64)  30 mL Oral BID WC  . hydrALAZINE  50 mg Oral 3 times per day  . insulin aspart  0-9 Units Subcutaneous TID WC  . isosorbide mononitrate  30 mg Oral Daily  . lisinopril  20 mg Oral Daily  . vancomycin  125 mg Oral 4 times per day  . Warfarin - Pharmacist Dosing Inpatient   Does not apply Q24H   Continuous Infusions: . sodium chloride 50 mL/hr at 04/16/14 1758    Principal Problem:   CHF (congestive heart failure) Active Problems:   Hypertension   Dyspnea   C. difficile colitis   Chest pain   Acute renal failure    Time spent: 23mins    Jehanzeb Memon  [facility] Pager [phone]. If 7PM-7AM, please contact night-coverage at [email or web address], password [facility] 04/16/2014, 6:12 PM  LOS: 3 days

## 2014-04-16 NOTE — Plan of Care (Signed)
Problem: Phase II Progression Outcomes Goal: Tolerating diet Outcome: Completed/Met Date Met:  04/16/14 Diet advanced.  Tolerating well.     

## 2014-04-16 NOTE — Progress Notes (Signed)
[place] for Coumadin Indication: h/o DVT  Allergies  Allergen Reactions  . Aspirin Itching   Patient Measurements: Height: 5\' 5"  (165.1 cm) Weight: 128 lb 12 oz (58.4 kg) IBW/kg (Calculated) : 61.5  Vital Signs: Temp: 97.8 F (36.6 C) (05/23 0633) Temp src: Oral (05/23 0633) BP: 133/63 mmHg (05/23 [id number]) Pulse Rate: 74 (05/23 0633)  Labs:  Recent Labs  04/13/14 1255 04/13/14 1907  04/14/14 0530 04/15/14 0522 04/15/14 1121 04/16/14 0632  HGB  --   --   < > 9.1* 8.8* 9.5* 9.3*  HCT  --   --   < > 27.9* 27.4* 29.3* 28.7*  PLT  --   --   --  373 397  --  375  LABPROT  --   --   --  32.7* 36.3*  --  31.9*  INR  --   --   --  3.35* 3.84*  --  3.24*  CREATININE  --   --   --  1.80* 1.89*  --  1.47*  TROPONINI <0.30 <0.30  --   --   --   --   --   < > = values in this interval not displayed. Estimated Creatinine Clearance: 43 ml/min (by C-G formula based on Cr of 1.47).  Medical History: Past Medical History  Diagnosis Date  . 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 Hypertension 10/14/2012  . Type 2 diabetes mellitus   . Diabetic nephropathy with proteinuria   . 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 Diastolic heart failure   . Cellulitis   . Convulsions   . Iron deficiency anemia   . 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 Anasarca   . Hypoalbuminemia   . Elevated alkaline phosphatase level   . Microcytic anemia   . GI bleed   . Nephrotic syndrome   . Difficulty in walking(719.7)   . Muscle weakness (generalized)   . Type II or unspecified type diabetes mellitus with renal manifestations, not stated as uncontrolled   . Iron deficiency anemia, unspecified    Medications:  Prescriptions prior to admission  Medication Sig Dispense Refill  . acetaminophen (TYLENOL) 325 MG tablet Take 650 mg by mouth every 4 (four) hours as needed for mild pain, moderate pain or fever (Over 100  degrees.).      Marland Kitchen cloNIDine (CATAPRES) 0.1 MG tablet Take 1 tablet (0.1 mg total) by mouth 3 (three) times daily.  60 tablet  11  . ferrous sulfate 325 (65 FE) MG tablet Take 1 tablet (325 mg total) by mouth 3 (three) times daily with meals.  90 tablet  3  . FLUoxetine (PROZAC) 10 MG capsule Take 1 capsule (10 mg total) by mouth daily.  30 capsule  3  . furosemide (LASIX) 40 MG tablet Take 40 mg by mouth daily.      Marland Kitchen glimepiride (AMARYL) 2 MG tablet Take 2 mg by mouth daily with breakfast.      . hydrALAZINE (APRESOLINE) 50 MG tablet Take 1 tablet (50 mg total) by mouth every 8 (eight) hours.  90 tablet  0  . ipratropium-albuterol (DUONEB) 0.5-2.5 (3) MG/3ML SOLN Take 3 mLs by nebulization every 4 (four) hours as needed (For SOB and Wheezing).       . isosorbide mononitrate (IMDUR) 30 MG 24 hr tablet Take 1 tablet (30 mg total) by mouth daily.  30 tablet  0  . lisinopril (PRINIVIL,ZESTRIL)  30 MG tablet Take 30 mg by mouth daily.      . potassium chloride SA (K-DUR,KLOR-CON) 20 MEQ tablet Take 20 mEq by mouth daily.      Marland Kitchen warfarin (COUMADIN) 4 MG tablet Take 8 mg by mouth daily at 6 PM.       Assessment: 63yo male on chronic Coumadin PTA for h/o DVT.  Home Coumadin dose is listed above.  INR is therapeutic on admission, but has trended >3 during admission.  Pt was started on Zithromax which can interact with Coumadin to increase INR. D/W Neil Crouch PA and plan is to allow INR to trend down so EGD can be done.  No plans to reverse coumadin with Vitamin K at this point.  INR is now trending down.    Goal of Therapy:  INR 2-3   Plan:  Hold Coumadin, resume when OK with GI service Daily INR   Gennett Garcia A Wylma Tatem 04/16/2014,7:57 AM

## 2014-04-16 NOTE — Progress Notes (Signed)
Subjective: Since I last evaluated the patient 2 JELLY LIKE STOOLS. NO BRBPR OR MELENA. NO FEVER, CHILLS, OR ABD PAIN. STILL ON A FULL LIQUID DIET. PT C/O FEELING LIKE SOMETHING CRAWLING ON HIS FACE. HE'S BEEN FEELING THIS SINCE HE WAS AT [place]. HE IS NOT SURE WHY HE IS ON PROZAC. HE  DENIES DEPRESSION OR ANXIETY. WANTS NURSING TO PROPERLY CARE FOR HIS FEET. DENIES HEARTBURN.   Objective: Vital signs in last 24 hours: Temp:  [96.9 F (36.1 C)-98.1 F (36.7 C)] 97.8 F (36.6 C) (05/23 [id number]) Pulse Rate:  [70-74] 73 (05/23 0908) Resp:  [18-20] 20 (05/23 [id number]) BP: (100-157)/(60-79) 157/79 mmHg (05/23 0908) SpO2:  [97 %-100 %] 100 % (05/23 [id number]) Weight:  [128 lb 12 oz (58.4 kg)-132 lb 3.2 oz (59.966 kg)] 128 lb 12 oz (58.4 kg) (05/23 [id number]) Last BM Date: 04/15/14  Intake/Output from previous day: 05/22 0701 - 05/23 0700 In: 1060 [P.O.:960; IV Piggyback:100] Out: 1400 [Urine:1400] Intake/Output this shift:   General appearance: alert, cooperative and no distress Resp: clear to auscultation bilaterally Cardio: regular rate and rhythm GI: soft, non-tender; bowel sounds normal;   Lab Results:  Recent Labs  04/14/14 0530 04/15/14 0522 04/15/14 1121 04/16/14 0632  WBC 5.0 5.9  --  6.5  HGB 9.1* 8.8* 9.5* 9.3*  HCT 27.9* 27.4* 29.3* 28.7*  PLT 373 397  --  375   BMET  Recent Labs  04/14/14 0530 04/15/14 0522 04/16/14 0632  NA 134* 134* 135*  K 4.7 4.2 3.9  CL 103 103 103  CO2 19 18* 19  GLUCOSE 135* 121* 181*  BUN 88* 88* 69*  CREATININE 1.80* 1.89* 1.47*  CALCIUM 8.8 8.8 8.8   LFT  Recent Labs  04/15/14 0522  PROT 7.2  ALBUMIN 2.1*  AST 31  ALT 49  ALKPHOS 272*  BILITOT 0.2*  BILIDIR <0.2  IBILI NOT CALCULATED   PT/INR  Recent Labs  04/15/14 0522 04/16/14 0632  LABPROT 36.3* 31.9*  INR 3.84* 3.24*    Studies/Results: No results found.  Medications: I have reviewed the patient's current medications.  Assessment/Plan: ADMITTED WITH  ANEMIA AND DIARRHEA ON COUMADIN. DIAGNOSED WITH RECURRENT C DIFF. FEELS LIKE SOMETHING IS CRAWLING ON HIS FACE LIKELY DUE TO MEDS.  PLAN: 1. VANC QID FOR 14 DAYS 2. REVIEWED MED LIST. D/C PROZAC AND ZANTAC 3. ADVANCE DIET  LOS: 3 days   Anabelle Bungert L Gisell Buehrle 04/16/2014, 12:24 PM

## 2014-04-16 NOTE — Progress Notes (Signed)
Late entry:  [facility] contacted to find out treatment for wound on patient's left foot.  Staff stated that the wounds were dressed with Santyl ointment daily.

## 2014-04-17 DIAGNOSIS — A0472 Enterocolitis due to Clostridium difficile, not specified as recurrent: Secondary | ICD-10-CM

## 2014-04-17 LAB — BASIC METABOLIC PANEL
BUN: 48 mg/dL — ABNORMAL HIGH (ref 6–23)
CO2: 20 mEq/L (ref 19–32)
Calcium: 9.1 mg/dL (ref 8.4–10.5)
Chloride: 105 mEq/L (ref 96–112)
Creatinine, Ser: 1.29 mg/dL (ref 0.50–1.35)
GFR calc Af Amer: 67 mL/min — ABNORMAL LOW (ref >90)
GFR, EST NON AFRICAN AMERICAN: 58 mL/min — AB (ref >90)
GLUCOSE: 182 mg/dL — AB (ref 70–99)
POTASSIUM: 3.9 meq/L (ref 3.7–5.3)
Sodium: 137 mEq/L (ref 137–147)

## 2014-04-17 LAB — GLUCOSE, CAPILLARY
GLUCOSE-CAPILLARY: 215 mg/dL — AB (ref 70–99)
GLUCOSE-CAPILLARY: 176 mg/dL — AB (ref 70–99)
GLUCOSE-CAPILLARY: 134 mg/dL — AB (ref 70–99)
GLUCOSE-CAPILLARY: 256 mg/dL — AB (ref 70–99)

## 2014-04-17 LAB — CBC
HEMATOCRIT: 32.6 % — AB (ref 39.0–52.0)
HEMOGLOBIN: 10.3 g/dL — AB (ref 13.0–17.0)
MCH: 25.1 pg — ABNORMAL LOW (ref 26.0–34.0)
MCHC: 31.6 g/dL (ref 30.0–36.0)
MCV: 79.3 fL (ref 78.0–100.0)
Platelets: 454 10*3/uL — ABNORMAL HIGH (ref 150–400)
RBC: 4.11 MIL/uL — ABNORMAL LOW (ref 4.22–5.81)
RDW: 19 % — ABNORMAL HIGH (ref 11.5–15.5)
WBC: 8.3 10*3/uL (ref 4.0–10.5)

## 2014-04-17 LAB — GAMMA GT: GGT: 370 U/L — ABNORMAL HIGH (ref 7–51)

## 2014-04-17 LAB — PROTIME-INR
INR: 2.36 — AB (ref 0.00–1.49)
Prothrombin Time: 25 seconds — ABNORMAL HIGH (ref 11.6–15.2)

## 2014-04-17 MED ORDER — COLLAGENASE 250 UNIT/GM EX OINT
TOPICAL_OINTMENT | Freq: Every day | CUTANEOUS | Status: DC
  Administered 2014-04-17 – 2014-04-18 (×2): via TOPICAL
  Filled 2014-04-17: qty 30

## 2014-04-17 MED ORDER — WARFARIN - PHARMACIST DOSING INPATIENT
Status: DC
  Administered 2014-04-17: 16:00:00

## 2014-04-17 MED ORDER — WARFARIN SODIUM 7.5 MG PO TABS
7.5000 mg | ORAL_TABLET | Freq: Once | ORAL | Status: AC
  Administered 2014-04-17: 7.5 mg via ORAL
  Filled 2014-04-17: qty 1

## 2014-04-17 NOTE — Progress Notes (Addendum)
Subjective: Since I last evaluated the patient HE HAD ONE WATERY/BROWN STOOL YESTERDAY. WANTS TO KNOW WHEN HE GETS TO GO BACK TO THE [facility]. NO MELENA OR BRBPR. TOLERTAING POS BUT HAS TROUBLE SWALLOWING SOLIDS. STATES HE HAS HAD EGD IN RECENT PAST. UGI APR 6 W/O BARIUM PILL SHOWS Normal esophageal distention without gross mass or stricture. Mild diffuse age-related esophageal dysmotility. CONCERNED ABOUT HIS FOOT CARE.  Objective: Vital signs in last 24 hours: Temp:  [97.6 F (36.4 C)-98.1 F (36.7 C)] 97.6 F (36.4 C) (05/24 0503) Pulse Rate:  [70-78] 76 (05/24 0503) Resp:  [20] 20 (05/24 0503) BP: (129-175)/(70-86) 158/74 mmHg (05/24 0503) SpO2:  [96 %-100 %] 100 % (05/24 0503) Weight:  [126 lb 15.8 oz (57.6 kg)] 126 lb 15.8 oz (57.6 kg) (05/24 0219) Last BM Date: 04/17/14  Intake/Output from previous day: 05/23 0701 - 05/24 0700 In: 2853.8 [P.O.:720; I.V.:2033.8; IV Piggyback:100] Out: 1800 [Urine:1800] Intake/Output this shift: Total I/O In: 240 [P.O.:240] Out: -   General appearance: alert, cooperative and no distress Resp: clear to auscultation bilaterally Cardio: regular rate and rhythm GI: soft, non-tender; bowel sounds normal;   Lab Results:  Recent Labs  04/15/14 0522 04/15/14 1121 04/16/14 0632 04/17/14 0556  WBC 5.9  --  6.5 8.3  HGB 8.8* 9.5* 9.3* 10.3*  HCT 27.4* 29.3* 28.7* 32.6*  PLT 397  --  375 454*   BMET  Recent Labs  04/15/14 0522 04/16/14 0632 04/17/14 0556  NA 134* 135* 137  K 4.2 3.9 3.9  CL 103 103 105  CO2 18* 19 20  GLUCOSE 121* 181* 182*  BUN 88* 69* 48*  CREATININE 1.89* 1.47* 1.29  CALCIUM 8.8 8.8 9.1   LFT  Recent Labs  04/15/14 0522  PROT 7.2  ALBUMIN 2.1*  AST 31  ALT 49  ALKPHOS 272*  BILITOT 0.2*  BILIDIR <0.2  IBILI NOT CALCULATED   PT/INR  Recent Labs  04/16/14 0632 04/17/14 0556  LABPROT 31.9* 25.0*  INR 3.24* 2.36*   Medications: I have reviewed the patient's current  medications.  Assessment/Plan: ADMITTED WITH DIARRHEA DUE TO C DIFF COLITIS. BLACK STOOLS LIKELY DUE TO IRON USE. ANEMIA LIKELY DUE TO CHRONIC DISEASE, LESS LIKELY DUE TO OCCULT GI BLEED. ELEVATED ALK PHOS WITH NL AST/ALT MOST LIKELY DUE TO RIGHT SIDED HEART FAILURE. LAST ECHO MAR 2015 DILATED RA/RV.  PLAN: 1. MAY RESTART COUMADIN. DISCUSSED WITH PHARMACY 2. VANCOMYCIN WITH ABX FOR PNA AND AN ADDITIONAL 14 DAYS. 3. PT INSTRUCTED THAT HE SHOULD RECEIVE ABX TO PREVENT C DIFF IF STARTED ON ABX AFTER VANCOMYCIN IS COMPLETE. 4. BARIUM PILL ESOPHAGRAM TO EVALUATE CONTINUE DYSPHAGIA. LAST EVAL ~ 2 MOS AGO WITH OUT A BARIUM PILL 5. LIKELY WOULD BENEFIT FROM A SOFT MECHANICAL DIET 6. OPV W/ SLF E30 IN 3 MOS DX: DYSPHAGIA, C DIFF COLITIS, ELEVATED ALK PHOS. CHECK GGT TODAY. 7. WILL SIGN OFF. CALL WITH QUESTIONS.    LOS: 4 days   Draydon Clairmont L Sylvi Rybolt 04/17/2014, 10:37 AM

## 2014-04-17 NOTE — Progress Notes (Addendum)
[facility] for Coumadin Indication: h/o DVT  Allergies  Allergen Reactions  . Aspirin Itching   Patient Measurements: Height: 5\' 5"  (165.1 cm) Weight: 126 lb 15.8 oz (57.6 kg) IBW/kg (Calculated) : 61.5  Vital Signs: Temp: 97.6 F (36.4 C) (05/24 0503) Temp src: Oral (05/24 0503) BP: 158/74 mmHg (05/24 0503) Pulse Rate: 76 (05/24 0503)  Labs:  Recent Labs  04/15/14 0522 04/15/14 1121 04/16/14 0632 04/17/14 0556  HGB 8.8* 9.5* 9.3* 10.3*  HCT 27.4* 29.3* 28.7* 32.6*  PLT 397  --  375 454*  LABPROT 36.3*  --  31.9* 25.0*  INR 3.84*  --  3.24* 2.36*  CREATININE 1.89*  --  1.47* 1.29   Estimated Creatinine Clearance: 48.4 ml/min (by C-G formula based on Cr of 1.29).  Medical History: Past Medical History  Diagnosis Date  . 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 Hypertension 10/14/2012  . Type 2 diabetes mellitus   . Diabetic nephropathy with proteinuria   . 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 Diastolic heart failure   . Cellulitis   . Convulsions   . Iron deficiency anemia   . 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 Anasarca   . Hypoalbuminemia   . Elevated alkaline phosphatase level   . Microcytic anemia   . GI bleed   . Nephrotic syndrome   . Difficulty in walking(719.7)   . Muscle weakness (generalized)   . Type II or unspecified type diabetes mellitus with renal manifestations, not stated as uncontrolled   . Iron deficiency anemia, unspecified    Medications:  Prescriptions prior to admission  Medication Sig Dispense Refill  . acetaminophen (TYLENOL) 325 MG tablet Take 650 mg by mouth every 4 (four) hours as needed for mild pain, moderate pain or fever (Over 100 degrees.).      Marland Kitchen cloNIDine (CATAPRES) 0.1 MG tablet Take 1 tablet (0.1 mg total) by mouth 3 (three) times daily.  60 tablet  11  . ferrous sulfate 325 (65 FE) MG tablet Take 1 tablet (325 mg total) by mouth 3  (three) times daily with meals.  90 tablet  3  . FLUoxetine (PROZAC) 10 MG capsule Take 1 capsule (10 mg total) by mouth daily.  30 capsule  3  . furosemide (LASIX) 40 MG tablet Take 40 mg by mouth daily.      Marland Kitchen glimepiride (AMARYL) 2 MG tablet Take 2 mg by mouth daily with breakfast.      . hydrALAZINE (APRESOLINE) 50 MG tablet Take 1 tablet (50 mg total) by mouth every 8 (eight) hours.  90 tablet  0  . ipratropium-albuterol (DUONEB) 0.5-2.5 (3) MG/3ML SOLN Take 3 mLs by nebulization every 4 (four) hours as needed (For SOB and Wheezing).       . isosorbide mononitrate (IMDUR) 30 MG 24 hr tablet Take 1 tablet (30 mg total) by mouth daily.  30 tablet  0  . lisinopril (PRINIVIL,ZESTRIL) 30 MG tablet Take 30 mg by mouth daily.      . potassium chloride SA (K-DUR,KLOR-CON) 20 MEQ tablet Take 20 mEq by mouth daily.      Marland Kitchen warfarin (COUMADIN) 4 MG tablet Take 8 mg by mouth daily at 6 PM.       Assessment: 63yo male on chronic Coumadin PTA for h/o DVT.  Home Coumadin dose is listed above.  INR is therapeutic on admission, but has trended >3 during admission.  Pt was  started on Zithromax which can interact with Coumadin to increase INR. Coumadin was held due to elevated INR and GI w/u.  D/W Dr Oneida Alar today and OK to resume Coumadin.  Goal of Therapy:  INR 2-3   Plan:  Coumadin 7.5mg  po today x 1 Daily INR   Joshua [name] A Joshua [name] 04/17/2014,9:12 AM

## 2014-04-17 NOTE — Progress Notes (Signed)
[facility] PROGRESS NOTE  FISHEL BIRKEY [id number] DOB: Jul 24, 1951 DOA: 04/13/2014 PCP: Duwaine Maxin, DO  Assessment/Plan: 1. Chest pain. Atypical. He has ruled out for this he negative cardiac markers. Recent left heart cath 01/2014 which indicated mild nonobstructive disease. His LV function is normal by echocardiogram. 2. Possible pneumonia. Patient is on antibiotics. Continue bronchodilators and supportive oxygen. He is no longer short of breath or coughing.  Afebrile.  Will discontinue antibiotics. 3. Acute renal failure superimposed on chronic kidney disease stage III. Possibly related to volume depletion.  He has been started on IV fluids and renal function appears to be improving.  Continue current treatments. 4. Heme positive stools. No signs of bleeding. Hemoglobin stable.  No plans on endoscopy.  Follow up with GI. 5. History of DVT on Coumadin. Restarting coumadin. 6. Chronic diastolic congestive heart failure. Appears to be compensated 7. Anemia. Likely related to chronic disease. Hemoglobin appears to be near baseline at this time 8. Recurrent Clostridium difficile infection. Patient's stool tested positive for C. difficile in 01/2014. He was treated with Flagyl. Since this is a recurring C. difficile infection, he'll be treated with a course of vancomycin.  Code Status: Full code Family Communication: Discussed with patient Disposition Plan: Likely discharge back to [facility]   Consultants:  Gastroenterology  Procedures:    Antibiotics:  Rocephin 5/20>>5/24  Azithromycin 5/20>>5/24  Vancomycin 5/22>> 6/7  HPI/Subjective: No new complaints. Feels diarrhea is improved. No shortness of breath or cough  Objective: Filed Vitals:   04/17/14 1518  BP: 155/72  Pulse: 80  Temp: 98.1 F (36.7 C)  Resp: 20    Intake/Output Summary (Last 24 hours) at 04/17/14 1751 Last data filed at 04/17/14 1636  Gross per 24 hour  Intake 3358.75 ml  Output    1800 ml  Net 1558.75 ml   Filed Weights   04/15/14 1421 04/16/14 0633 04/17/14 0219  Weight: 59.966 kg (132 lb 3.2 oz) 58.4 kg (128 lb 12 oz) 57.6 kg (126 lb 15.8 oz)    Exam:   General:  NAD  Cardiovascular: S1, S2 RRR  Respiratory: diminished breath sounds at bases but otherwise clear  Abdomen: soft, nt, nd, bs+  Musculoskeletal: no edema in LE b/l, chronic stasis changes   Data Reviewed: Basic Metabolic Panel:  Recent Labs Lab 04/13/14 0321 04/14/14 0530 04/15/14 0522 04/16/14 0632 04/17/14 0556  NA 133* 134* 134* 135* 137  K 4.8 4.7 4.2 3.9 3.9  CL 101 103 103 103 105  CO2 19 19 18* 19 20  GLUCOSE 226* 135* 121* 181* 182*  BUN 100* 88* 88* 69* 48*  CREATININE 1.95* 1.80* 1.89* 1.47* 1.29  CALCIUM 9.0 8.8 8.8 8.8 9.1   Liver Function Tests:  Recent Labs Lab 04/13/14 0321 04/14/14 0530 04/15/14 0522  AST 43* 28 31  ALT 69* 51 49  ALKPHOS 355* 279* 272*  BILITOT 0.2* 0.2* 0.2*  PROT 7.9 7.2 7.2  ALBUMIN 2.4* 2.1* 2.1*    Recent Labs Lab 04/13/14 0731 04/15/14 0522  LIPASE 131* 95*   No results found for this basename: AMMONIA,  in the last 168 hours CBC:  Recent Labs Lab 04/13/14 0321 04/14/14 0530 04/15/14 0522 04/15/14 1121 04/16/14 0632 04/17/14 0556  WBC 8.1 5.0 5.9  --  6.5 8.3  NEUTROABS 5.4  --   --   --   --   --   HGB 10.3* 9.1* 8.8* 9.5* 9.3* 10.3*  HCT 32.1* 27.9* 27.4* 29.3* 28.7* 32.6*  MCV 79.7 79.0 78.5  --  78.4 79.3  PLT 415* 373 397  --  375 454*   Cardiac Enzymes:  Recent Labs Lab 04/13/14 0321 04/13/14 0729 04/13/14 1255 04/13/14 1907  TROPONINI <0.30 <0.30 <0.30 <0.30   BNP (last 3 results)  Recent Labs  01/30/14 2028 04/13/14 0321  PROBNP 9165.0* 1440.0*   CBG:  Recent Labs Lab 04/16/14 1730 04/16/14 2209 04/17/14 0739 04/17/14 1126 04/17/14 1701  GLUCAP 107* 107* 215* 176* 134*    Recent Results (from the past 240 hour(s))  CLOSTRIDIUM DIFFICILE BY PCR     Status: Abnormal    Collection Time    04/14/14  5:39 PM      Result Value Ref Range Status   C difficile by pcr POSITIVE (*) NEGATIVE Final   Comment: CRITICAL RESULT CALLED TO, READ BACK BY AND VERIFIED WITH:     FOOTE,L. AT 1935 ON 04/14/2014 BY BAUGHAM,M.     Studies: No results found.  Scheduled Meds: . cloNIDine  0.1 mg Oral TID  . collagenase   Topical Daily  . darbepoetin  100 mcg Subcutaneous Q Wed-1800  . feeding supplement (PRO-STAT SUGAR FREE 64)  30 mL Oral BID WC  . hydrALAZINE  50 mg Oral 3 times per day  . insulin aspart  0-9 Units Subcutaneous TID WC  . isosorbide mononitrate  30 mg Oral Daily  . lisinopril  20 mg Oral Daily  . vancomycin  125 mg Oral 4 times per day  . Warfarin - Pharmacist Dosing Inpatient   Does not apply Q24H   Continuous Infusions: . sodium chloride 50 mL/hr at 04/16/14 1758    Principal Problem:   CHF (congestive heart failure) Active Problems:   Hypertension   Dyspnea   C. difficile colitis   Chest pain   Acute renal failure    Time spent: 62mins    Jehanzeb Memon  [facility] Pager [phone]. If 7PM-7AM, please contact night-coverage at [email or web address], password [facility] 04/17/2014, 5:51 PM  LOS: 4 days

## 2014-04-18 ENCOUNTER — Inpatient Hospital Stay
Admission: RE | Admit: 2014-04-18 | Discharge: 2014-06-27 | Disposition: A | Discharge status: Home / Self Care | Payer: Self-pay | Source: Ambulatory Visit | Attending: Internal Medicine | Admitting: Internal Medicine

## 2014-04-18 DIAGNOSIS — I824Y9 Acute embolism and thrombosis of unspecified deep veins of unspecified proximal lower extremity: Secondary | ICD-10-CM

## 2014-04-18 LAB — PROTIME-INR
INR: 1.76 — AB (ref 0.00–1.49)
PROTHROMBIN TIME: 20 s — AB (ref 11.6–15.2)

## 2014-04-18 LAB — BASIC METABOLIC PANEL
BUN: 39 mg/dL — AB (ref 6–23)
CO2: 19 mEq/L (ref 19–32)
Calcium: 8.8 mg/dL (ref 8.4–10.5)
Chloride: 106 mEq/L (ref 96–112)
Creatinine, Ser: 1.07 mg/dL (ref 0.50–1.35)
GFR, EST AFRICAN AMERICAN: 84 mL/min — AB (ref >90)
GFR, EST NON AFRICAN AMERICAN: 72 mL/min — AB (ref >90)
Glucose, Bld: 123 mg/dL — ABNORMAL HIGH (ref 70–99)
POTASSIUM: 4 meq/L (ref 3.7–5.3)
Sodium: 138 mEq/L (ref 137–147)

## 2014-04-18 LAB — GLUCOSE, CAPILLARY: Glucose-Capillary: 158 mg/dL — ABNORMAL HIGH (ref 70–99)

## 2014-04-18 MED ORDER — WARFARIN SODIUM 7.5 MG PO TABS: 7.5000 mg | ORAL_TABLET | Freq: Once | ORAL | Status: DC

## 2014-04-18 MED ORDER — FUROSEMIDE 40 MG PO TABS: 20.0000 mg | ORAL_TABLET | Freq: Every day | ORAL | Status: DC

## 2014-04-18 MED ORDER — VANCOMYCIN 50 MG/ML ORAL SOLUTION: 125.0000 mg | Freq: Four times a day (QID) | ORAL | Status: DC

## 2014-04-18 NOTE — Discharge Summary (Signed)
Physician Discharge Summary  DORVIN [name] [id number] DOB: [birth year] DOA: 04/13/2014  PCP: Duwaine Maxin, DO  Admit date: 04/13/2014 Discharge date: 04/18/2014  Time spent: 40 minutes  Recommendations for Outpatient Follow-up:  1. Patient will be discharged back to [facility] 2. Repeat BMET in 3 days to check renal function and electrolytes, lasix decreased from 40mg  daily for 20mg  daily 3. INR in 3 days 4. CBC in 3 days to check hemoglobin.  Hemoglobin in hospital has been between 9-10.  Discharge Diagnoses:  Principal Problem:   Chest pain Active Problems:   Hypertension   Dyspnea   C. difficile colitis   Recent DVT on anticoagulation   Acute renal failure   Diabetes   Hypoalbuminemia   Anemia   Heme positive stools   Possible pneumonia   Chronic respiratory failure   Chronic diastolic congestive heart failure   Discharge Condition: improved  Diet recommendation: low salt, low carb  Filed Weights   04/16/14 0633 04/17/14 0219 04/18/14 0547  Weight: 58.4 kg (128 lb 12 oz) 57.6 kg (126 lb 15.8 oz) 57.1 kg (125 lb 14.1 oz)    History of present illness:  Joshua [name] is a 63 y.o. 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 He ruled out for ACS with negative cardiac markers and a non acute EKG.  Chest xray indicated possible pneumonia, and he was adequately treated with antibiotics.  He is no longer short of breath and has a normal wbc count. No fever.  He was noted  to have elevated BUN and creatinine. This was felt to be overdiuresis with lasix.  He was given gentle hydration with improvement in her renal function.  He will be restarted on a lower dose of lasix  He was also noted to have dark stools that were heme positive.  He was taking oral iron.  He was seen by GI and patient's hemoglobin was monitored.  His hemoglobin remained stable and it was not felt that EGD was necessary at this time.  It was recommended to continue the patient on his anticoagulation.  His stool did test positive for C diff and he was started on oral vancomycin for a total of 2 weeks. He is no longer having any diarrhea.    Patient is felt stable to discharge to SNF.  Procedures:    Consultations:  Gastroenterology  Discharge Exam: Filed Vitals:   04/18/14 0547  BP: 163/89  Pulse: 80  Temp: 98.3 F (36.8 C)  Resp: 20    General: NAD Cardiovascular: s1, s2, rrr Respiratory: cta b  Discharge Instructions You were cared for by a hospitalist during your hospital stay. If you have any questions about your discharge medications or the care you received while you were in the hospital after you are discharged, you can call the unit and asked to speak with the hospitalist on call if the hospitalist that took care of you is not available. Once you are discharged, your primary care physician will handle any further  medical issues. Please note that NO REFILLS for any discharge medications will be authorized once you are discharged, as it is imperative that you return to your primary care physician (or establish a relationship with a primary care physician if you do not have one) for your aftercare needs so that they can reassess your need for medications and monitor your lab values.  Discharge Instructions   Diet - low sodium heart healthy    Complete by:  As directed      Increase activity slowly    Complete by:  As directed             Medication List    STOP taking  these medications       FLUoxetine 10 MG capsule  Commonly known as:  PROZAC      TAKE these medications       acetaminophen 325 MG tablet  Commonly known as:  TYLENOL  Take 650 mg by mouth every 4 (four) hours as needed for mild pain, moderate pain or fever (Over 100 degrees.).     cloNIDine 0.1 MG tablet  Commonly known as:  CATAPRES  Take 1 tablet (0.1 mg total) by mouth 3 (three) times daily.     ferrous sulfate 325 (65 FE) MG tablet  Take 1 tablet (325 mg total) by mouth 3 (three) times daily with meals.     furosemide 40 MG tablet  Commonly known as:  LASIX  Take 0.5 tablets (20 mg total) by mouth daily.     glimepiride 2 MG tablet  Commonly known as:  AMARYL  Take 2 mg by mouth daily with breakfast.     hydrALAZINE 50 MG tablet  Commonly known as:  APRESOLINE  Take 1 tablet (50 mg total) by mouth every 8 (eight) hours.     ipratropium-albuterol 0.5-2.5 (3) MG/3ML Soln  Commonly known as:  DUONEB  Take 3 mLs by nebulization every 4 (four) hours as needed (For SOB and Wheezing).     isosorbide mononitrate 30 MG 24 hr tablet  Commonly known as:  IMDUR  Take 1 tablet (30 mg total) by mouth daily.     lisinopril 30 MG tablet  Commonly known as:  PRINIVIL,ZESTRIL  Take 30 mg by mouth daily.     potassium chloride SA 20 MEQ tablet  Commonly known as:  K-DUR,KLOR-CON  Take 20 mEq by mouth daily.     vancomycin 50 mg/mL oral solution  Commonly known as:  VANCOCIN  Take 2.5 mLs (125 mg total) by mouth every 6 (six) hours. Until 05/01/14     warfarin 4 MG tablet  Commonly known as:  COUMADIN  Take 8 mg by mouth daily at 6 PM.       Allergies  Allergen Reactions  . Aspirin Itching      The results of significant diagnostics from this hospitalization (including imaging, microbiology, ancillary and laboratory) are listed below for reference.    Significant Diagnostic Studies: Dg Chest 2 View  04/13/2014   CLINICAL DATA:  CHEST PAIN  EXAM: CHEST  2 VIEW   COMPARISON:  DG CHEST 1V PORT dated 02/06/2014  FINDINGS: Low lung volumes. Cardiac silhouette is enlarged. There is prominence of the interstitial markings and peribronchial cuffing. Linear increased density right lung base blunting of the left costophrenic angle. Ill-defined density left lung base.  IMPRESSION: Mild pulmonary edema.  Scarring versus atelectasis right lung base  Atelectasis versus infiltrate left lung base and small left effusion.   Electronically  Signed   By: Margaree Mackintosh M.D.   On: 04/13/2014 03:45   US Abdomen Complete  04/13/2014   CLINICAL DATA:  Elevated alkaline phosphatase  EXAM: ULTRASOUND ABDOMEN COMPLETE  COMPARISON:  01/31/2014  FINDINGS: Gallbladder:  Small amount of dependent sludge within gallbladder. No shadowing calculi, gallbladder wall thickening or sonographic Murphy sign.  Common bile duct:  Diameter: Upper normal caliber 6 mm diameter, not significantly changed  Liver:  Normal appearance  IVC:  Normal appearance  Pancreas:  Normal appearance  Spleen:  Normal appearance, 6.4 cm length  Right Kidney:  Length: 11.3 cm. Normal cortical thickness. Upper normal cortical echogenicity. No mass or hydronephrosis.  Left Kidney:  Length: 11.2 cm. Slightly increased cortical echogenicity. Cortical thickness normal. No gross mass or hydronephrosis.  Abdominal aorta:  Normal caliber  Other findings:  Trace free fluid near gallbladder fossa. LEFT pleural effusion noted.  IMPRESSION: Sludge within gallbladder without gallstones or evidence of acute cholecystitis.  Increased LEFT renal cortical echogenicity without gross mass or hydronephrosis, uncertain etiology, with RIGHT kidney demonstrating upper normal cortical echogenicity, question medical renal disease.  LEFT pleural effusion.   Electronically Signed   By: Lavonia Dana M.D.   On: 04/13/2014 11:07    Microbiology: Recent Results (from the past 240 hour(s))  CLOSTRIDIUM DIFFICILE BY PCR     Status: Abnormal   Collection Time     04/14/14  5:39 PM      Result Value Ref Range Status   C difficile by pcr POSITIVE (*) NEGATIVE Final   Comment: CRITICAL RESULT CALLED TO, READ BACK BY AND VERIFIED WITH:     FOOTE,L. AT 1935 ON 04/14/2014 BY BAUGHAM,M.     Labs: Basic Metabolic Panel:  Recent Labs Lab 04/14/14 0530 04/15/14 0522 04/16/14 0632 04/17/14 0556 04/18/14 0534  NA 134* 134* 135* 137 138  K 4.7 4.2 3.9 3.9 4.0  CL 103 103 103 105 106  CO2 19 18* 19 20 19   GLUCOSE 135* 121* 181* 182* 123*  BUN 88* 88* 69* 48* 39*  CREATININE 1.80* 1.89* 1.47* 1.29 1.07  CALCIUM 8.8 8.8 8.8 9.1 8.8   Liver Function Tests:  Recent Labs Lab 04/13/14 0321 04/14/14 0530 04/15/14 0522  AST 43* 28 31  ALT 69* 51 49  ALKPHOS 355* 279* 272*  BILITOT 0.2* 0.2* 0.2*  PROT 7.9 7.2 7.2  ALBUMIN 2.4* 2.1* 2.1*    Recent Labs Lab 04/13/14 0731 04/15/14 0522  LIPASE 131* 95*   No results found for this basename: AMMONIA,  in the last 168 hours CBC:  Recent Labs Lab 04/13/14 0321 04/14/14 0530 04/15/14 0522 04/15/14 1121 04/16/14 0632 04/17/14 0556  WBC 8.1 5.0 5.9  --  6.5 8.3  NEUTROABS 5.4  --   --   --   --   --   HGB 10.3* 9.1* 8.8* 9.5* 9.3* 10.3*  HCT 32.1* 27.9* 27.4* 29.3* 28.7* 32.6*  MCV 79.7 79.0 78.5  --  78.4 79.3  PLT 415* 373 397  --  375 454*   Cardiac Enzymes:  Recent Labs Lab 04/13/14 0321 04/13/14 0729 04/13/14 1255 04/13/14 1907  TROPONINI <0.30 <0.30 <0.30 <0.30   BNP: BNP (last 3 results)  Recent Labs  01/30/14 2028 04/13/14 0321  PROBNP 9165.0* 1440.0*   CBG:  Recent Labs Lab 04/17/14 0739 04/17/14 1126 04/17/14 1701 04/17/14 2227 04/18/14 0930  GLUCAP 215* 176* 134* 256* 158*       Signed:  Dorris Pierre  [facility] 04/18/2014,  10:49 AM

## 2014-04-18 NOTE — Progress Notes (Deleted)
Nutrition Brief Note  RD pulled to chart due to LOS  Wt Readings from Last 15 Encounters:  04/18/14 125 lb 14.1 oz (57.1 kg)  03/17/14 142 lb 12.8 oz (64.774 kg)  02/12/14 145 lb 3.7 oz (65.877 kg)  02/12/14 145 lb 3.7 oz (65.877 kg)  01/21/14 158 lb 15.2 oz (72.1 kg)  01/21/14 158 lb 15.2 oz (72.1 kg)  01/05/14 150 lb 3.2 oz (68.13 kg)  11/26/13 154 lb 4.8 oz (69.99 kg)  11/05/13 161 lb 4.8 oz (73.165 kg)  07/14/13 143 lb 9.6 oz (65.137 kg)  05/26/13 141 lb 9.6 oz (64.229 kg)  04/09/13 149 lb 11.2 oz (67.903 kg)  04/01/13 157 lb 1.6 oz (71.26 kg)  03/22/13 161 lb 1.6 oz (73.074 kg)  03/19/13 163 lb 1.6 oz (73.982 kg)    Body mass index is 20.95 kg/(m^2). Patient meets criteria for normal weight based on current BMI.   Current diet order is Heart Healthy, patient is consuming approximately 100% of meals at this time. Labs and medications reviewed.   No nutrition interventions warranted at this time. If nutrition issues arise, please consult RD.   Todd Argabright A. Jimmye Norman, RD, LDN Pager: [phone]

## 2014-04-18 NOTE — Progress Notes (Addendum)
Pt is to be discharged back to [facility] today. Pt is in NAD, IV is out, all paperwork has been reviewed/discussed with patient, and there are no questions/concerns at this time. Assessment is unchanged from this morning and report has been called to [facility]. Pt is to be accompanied downstairs by staff via wheelchair.

## 2014-04-18 NOTE — Progress Notes (Signed)
[place] for Coumadin Indication: h/o DVT  Allergies  Allergen Reactions  . Aspirin Itching   Patient Measurements: Height: 5\' 5"  (165.1 cm) Weight: 125 lb 14.1 oz (57.1 kg) IBW/kg (Calculated) : 61.5  Vital Signs: Temp: 98.3 F (36.8 C) (05/25 0547) Temp src: Oral (05/25 0547) BP: 163/89 mmHg (05/25 0547) Pulse Rate: 80 (05/25 0547)  Labs:  Recent Labs  04/15/14 1121 04/16/14 0632 04/17/14 0556 04/18/14 0534  HGB 9.5* 9.3* 10.3*  --   HCT 29.3* 28.7* 32.6*  --   PLT  --  375 454*  --   LABPROT  --  31.9* 25.0* 20.0*  INR  --  3.24* 2.36* 1.76*  CREATININE  --  1.47* 1.29 1.07   Estimated Creatinine Clearance: 57.8 ml/min (by C-G formula based on Cr of 1.07).  Medical History: Past Medical History  Diagnosis Date  . 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 Hypertension 10/14/2012  . Type 2 diabetes mellitus   . Diabetic nephropathy with proteinuria   . 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 Diastolic heart failure   . Cellulitis   . Convulsions   . Iron deficiency anemia   . 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 Anasarca   . Hypoalbuminemia   . Elevated alkaline phosphatase level   . Microcytic anemia   . GI bleed   . Nephrotic syndrome   . Difficulty in walking(719.7)   . Muscle weakness (generalized)   . Type II or unspecified type diabetes mellitus with renal manifestations, not stated as uncontrolled   . Iron deficiency anemia, unspecified    Medications:  Prescriptions prior to admission  Medication Sig Dispense Refill  . acetaminophen (TYLENOL) 325 MG tablet Take 650 mg by mouth every 4 (four) hours as needed for mild pain, moderate pain or fever (Over 100 degrees.).      Marland Kitchen cloNIDine (CATAPRES) 0.1 MG tablet Take 1 tablet (0.1 mg total) by mouth 3 (three) times daily.  60 tablet  11  . ferrous sulfate 325 (65 FE) MG tablet Take 1 tablet (325 mg total) by mouth 3  (three) times daily with meals.  90 tablet  3  . FLUoxetine (PROZAC) 10 MG capsule Take 1 capsule (10 mg total) by mouth daily.  30 capsule  3  . furosemide (LASIX) 40 MG tablet Take 40 mg by mouth daily.      Marland Kitchen glimepiride (AMARYL) 2 MG tablet Take 2 mg by mouth daily with breakfast.      . hydrALAZINE (APRESOLINE) 50 MG tablet Take 1 tablet (50 mg total) by mouth every 8 (eight) hours.  90 tablet  0  . ipratropium-albuterol (DUONEB) 0.5-2.5 (3) MG/3ML SOLN Take 3 mLs by nebulization every 4 (four) hours as needed (For SOB and Wheezing).       . isosorbide mononitrate (IMDUR) 30 MG 24 hr tablet Take 1 tablet (30 mg total) by mouth daily.  30 tablet  0  . lisinopril (PRINIVIL,ZESTRIL) 30 MG tablet Take 30 mg by mouth daily.      . potassium chloride SA (K-DUR,KLOR-CON) 20 MEQ tablet Take 20 mEq by mouth daily.      Marland Kitchen warfarin (COUMADIN) 4 MG tablet Take 8 mg by mouth daily at 6 PM.       Assessment: 62yo male on chronic Coumadin PTA for h/o DVT.  Home Coumadin dose is listed above.  INR is therapeutic on admission, but has trended >  3 during admission.  Pt was started on Zithromax which can interact with Coumadin to increase INR. Coumadin was held due to elevated INR and GI w/u.  D/W Dr Oneida Alar on 5/24 and OK to resume Coumadin.  Goal of Therapy:  INR 2-3   Plan:  Coumadin 7.5mg  po today x 1 Daily INR   Rebeccah Ivins A Cyndy Braver 04/18/2014,7:55 AM

## 2014-04-18 NOTE — Care Management Note (Signed)
    Page 1 of 1   04/18/2014     12:47:45 PM CARE MANAGEMENT NOTE 04/18/2014  Patient:  Joshua [name], Joshua [name]   Account Number:  [id number]  Date Initiated:  04/18/2014  Documentation initiated by:  Vladimir Creeks  Subjective/Objective Assessment:   Admitted from the [facility], with CHF and c-diff colitis. Will return to [facility] at D/C     Action/Plan:   CSW aware of D/C   Anticipated DC Date:  04/18/2014   Anticipated DC Plan:  SKILLED NURSING FACILITY  In-house referral  Clinical Social Worker      DC Planning Services  CM consult      Choice offered to / List presented to:             Status of service:  Completed, signed off Medicare Important Message given?   (If response is "NO", the following Medicare IM given date fields will be blank) Date Medicare IM given:   Date Additional Medicare IM given:    Discharge Disposition:  [place]  Per UR Regulation:  Reviewed for med. necessity/level of care/duration of stay  If discussed at [place] of Stay Meetings, dates discussed:    Comments:  04/18/14 [employer] RN/CM

## 2014-04-18 NOTE — Progress Notes (Addendum)
Nutrition Brief Note  RD pulled to chart due to LOS.   Wt Readings from Last 15 Encounters:  04/18/14 125 lb 14.1 oz (57.1 kg)  03/17/14 142 lb 12.8 oz (64.774 kg)  02/12/14 145 lb 3.7 oz (65.877 kg)  02/12/14 145 lb 3.7 oz (65.877 kg)  01/21/14 158 lb 15.2 oz (72.1 kg)  01/21/14 158 lb 15.2 oz (72.1 kg)  01/05/14 150 lb 3.2 oz (68.13 kg)  11/26/13 154 lb 4.8 oz (69.99 kg)  11/05/13 161 lb 4.8 oz (73.165 kg)  07/14/13 143 lb 9.6 oz (65.137 kg)  05/26/13 141 lb 9.6 oz (64.229 kg)  04/09/13 149 lb 11.2 oz (67.903 kg)  04/01/13 157 lb 1.6 oz (71.26 kg)  03/22/13 161 lb 1.6 oz (73.074 kg)  03/19/13 163 lb 1.6 oz (73.982 kg)   Pt admitted from [facility] with CP/CHF. Pt with plantar wound on left foot. Receives 30 ml Prostat BID, which provides additional 200 kcals and 30 grams protein for additional nutrition support. Appetite has been excellent. Possible d/c back to [facility] today.   Body mass index is 20.95 kg/(m^2). Patient meets criteria for normal weight range based on current BMI.   Current diet order is Heart Healthy, patient is consuming approximately 100% of meals at this time. Labs and medications reviewed.   No nutrition interventions warranted at this time. If nutrition issues arise, please consult RD.   Sahej Hauswirth A. Jimmye Norman, RD, LDN Pager: [phone]

## 2014-04-18 NOTE — Clinical Social Work Note (Signed)
Pt d/c today back to [facility]. Pt and facility aware and agreeable. Will transfer with RN.  Benay Pike, [place]

## 2014-04-19 ENCOUNTER — Telehealth: Payer: Self-pay | Admitting: Gastroenterology

## 2014-04-19 ENCOUNTER — Ambulatory Visit (HOSPITAL_COMMUNITY): Admit: 2014-04-19 | Payer: MEDICAID

## 2014-04-19 ENCOUNTER — Other Ambulatory Visit: Payer: Self-pay | Admitting: Gastroenterology

## 2014-04-19 DIAGNOSIS — R1319 Other dysphagia: Secondary | ICD-10-CM

## 2014-04-19 LAB — GLUCOSE, CAPILLARY
GLUCOSE-CAPILLARY: 108 mg/dL — AB (ref 70–99)
Glucose-Capillary: 215 mg/dL — ABNORMAL HIGH (ref 70–99)
Glucose-Capillary: 169 mg/dL — ABNORMAL HIGH (ref 70–99)
Glucose-Capillary: 99 mg/dL (ref 70–99)

## 2014-04-19 NOTE — Telephone Encounter (Signed)
BPE is scheduled for Thursday May 28th at 8:30 am and Joshua [name] Nurse Minette Brine is aware

## 2014-04-19 NOTE — Telephone Encounter (Signed)
REVIEWED.  

## 2014-04-19 NOTE — Telephone Encounter (Signed)
Message copied by Rodney Langton on Tue Apr 19, 2014  8:49 AM ------      Message from: Danie Binder      Created: Sun Apr 17, 2014  1:54 PM       Pt need bpe next week for dx: dysphagia ------

## 2014-04-20 ENCOUNTER — Non-Acute Institutional Stay (SKILLED_NURSING_FACILITY): Payer: Self-pay | Admitting: Internal Medicine

## 2014-04-20 DIAGNOSIS — R079 Chest pain, unspecified: Secondary | ICD-10-CM

## 2014-04-20 DIAGNOSIS — A0472 Enterocolitis due to Clostridium difficile, not specified as recurrent: Secondary | ICD-10-CM

## 2014-04-20 DIAGNOSIS — I82439 Acute embolism and thrombosis of unspecified popliteal vein: Secondary | ICD-10-CM

## 2014-04-20 DIAGNOSIS — I824Y9 Acute embolism and thrombosis of unspecified deep veins of unspecified proximal lower extremity: Secondary | ICD-10-CM

## 2014-04-20 DIAGNOSIS — I5033 Acute on chronic diastolic (congestive) heart failure: Secondary | ICD-10-CM

## 2014-04-20 LAB — GLUCOSE, CAPILLARY
GLUCOSE-CAPILLARY: 155 mg/dL — AB (ref 70–99)
Glucose-Capillary: 97 mg/dL (ref 70–99)
Glucose-Capillary: 131 mg/dL — ABNORMAL HIGH (ref 70–99)
Glucose-Capillary: 117 mg/dL — ABNORMAL HIGH (ref 70–99)

## 2014-04-20 LAB — MITOCHONDRIAL ANTIBODIES: MITOCHONDRIAL M2 AB, IGG: 1.79 — AB (ref <0.91)

## 2014-04-21 ENCOUNTER — Ambulatory Visit (HOSPITAL_COMMUNITY)
Admission: RE | Admit: 2014-04-21 | Discharge: 2014-04-21 | Disposition: A | Discharge status: Home / Self Care | Payer: Self-pay | Source: Ambulatory Visit | Attending: Gastroenterology | Admitting: Gastroenterology

## 2014-04-21 DIAGNOSIS — R1319 Other dysphagia: Secondary | ICD-10-CM

## 2014-04-21 LAB — GLUCOSE, CAPILLARY
GLUCOSE-CAPILLARY: 98 mg/dL (ref 70–99)
Glucose-Capillary: 194 mg/dL — ABNORMAL HIGH (ref 70–99)
Glucose-Capillary: 119 mg/dL — ABNORMAL HIGH (ref 70–99)

## 2014-04-22 LAB — GLUCOSE, CAPILLARY
GLUCOSE-CAPILLARY: 136 mg/dL — AB (ref 70–99)
Glucose-Capillary: 102 mg/dL — ABNORMAL HIGH (ref 70–99)
Glucose-Capillary: 185 mg/dL — ABNORMAL HIGH (ref 70–99)

## 2014-04-23 LAB — GLUCOSE, CAPILLARY
GLUCOSE-CAPILLARY: 121 mg/dL — AB (ref 70–99)
GLUCOSE-CAPILLARY: 73 mg/dL (ref 70–99)
GLUCOSE-CAPILLARY: 202 mg/dL — AB (ref 70–99)
Glucose-Capillary: 97 mg/dL (ref 70–99)

## 2014-04-24 LAB — GLUCOSE, CAPILLARY
GLUCOSE-CAPILLARY: 196 mg/dL — AB (ref 70–99)
GLUCOSE-CAPILLARY: 157 mg/dL — AB (ref 70–99)
GLUCOSE-CAPILLARY: 156 mg/dL — AB (ref 70–99)
Glucose-Capillary: 91 mg/dL (ref 70–99)

## 2014-04-25 ENCOUNTER — Non-Acute Institutional Stay (SKILLED_NURSING_FACILITY): Payer: Self-pay | Admitting: Internal Medicine

## 2014-04-25 DIAGNOSIS — D649 Anemia, unspecified: Secondary | ICD-10-CM

## 2014-04-25 DIAGNOSIS — I5033 Acute on chronic diastolic (congestive) heart failure: Secondary | ICD-10-CM

## 2014-04-25 DIAGNOSIS — E1129 Type 2 diabetes mellitus with other diabetic kidney complication: Secondary | ICD-10-CM

## 2014-04-25 LAB — GLUCOSE, CAPILLARY
GLUCOSE-CAPILLARY: 88 mg/dL (ref 70–99)
Glucose-Capillary: 138 mg/dL — ABNORMAL HIGH (ref 70–99)
Glucose-Capillary: 39 mg/dL — CL (ref 70–99)
Glucose-Capillary: 68 mg/dL — ABNORMAL LOW (ref 70–99)
Glucose-Capillary: 106 mg/dL — ABNORMAL HIGH (ref 70–99)

## 2014-04-25 NOTE — Progress Notes (Addendum)
Patient ID: Joshua [name], male   DOB: 1951-02-13, 63 y.o.   MRN: [id number]                  HISTORY & PHYSICAL  DATE:  04/20/2014      FACILITY: [facility]    LEVEL OF CARE:   SNF   CHIEF COMPLAINT:  Readmission to the facility, post stay at [facility], 04/13/2014 through 04/18/2014.    HISTORY OF PRESENT ILLNESS:  This is a medically complex, 63 year-old man with a past history of type 2 diabetes, DVT, chronic renal failure, congestive heart failure.    He was originally admitted to the facility after undergoing surgery on his left foot.  They had to drain an abscess.  This has been gradually healing, although he  apparently still has an area on the plantar aspect of his foot that has not totally resolved.    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  He underwent 14 minutes of ACLS.  An echo showed an EF of 55-60%.  He had a left heart catheterization.    On this occasion, he  apparently complained of chest pain and shortness of breath.  He ruled out for acute coronary syndrome.  Chest x-ray indicated possible pneumonia.  He did not have a CT scan of the chest.  He was treated with antibiotics and the shortness of breath resolved.    He was also noted to have an elevated BUN and creatinine, felt to be secondary to prerenal factors.  He was given gentle rehydration and will be started on a lower dose of Lasix.    Finally, he had dark stools that were heme-positive.  He was taking iron.  He was seen by GI.  His hemoglobin remained stable and it was recommended that he continue with anticoagulation.  It was not felt that he needed work-up at that time.    He was also thought to be positive for C.diff, although it is not really clear to me that he was having diarrhea.  He was given two weeks of vancomycin.    CURRENT MEDICATIONS:  Medication list is reviewed.    Tylenol p.r.n.      Clonidine 0.1 three times a day.    Ferrous sulfate 325 three times a day.    Lasix 20 mg a day (this is, I think, reduced from 40 once a day or b.i.d.).     Amaryl 2 mg daily.        Hydralazine 50 q.8.    DuoNebs every 4 hours p.r.n.    Imdur 30 q.d.    Lisinopril 30 mg q.d.    K-dur 20 q.d.    Vancomycin 125 mg every 6 hours until 05/01/2014.    Coumadin 8 mg daily, with an INR today of 1.94.   We will increase him to 8.5.  We will recheck this in a week.    SOCIAL HISTORY: HOUSING:  The patient actually has his own home and  apparently family support in [place].    There had been some talk about him going home.  His disability has gone through, but he is not yet Medicaid certified.    REVIEW OF SYSTEMS:   CHEST/RESPIRATORY:  He is not complaining of shortness of breath.   CARDIAC:   No chest pain.   GI:  No nausea, vomiting or diarrhea is noted.   GU:  No dysuria.   EDEMA/VARICOSITIES:  Extremities:  He is complaining of lower extremity edema.    PHYSICAL EXAMINATION:   VITAL SIGNS:   O2 SATURATIONS:  96% on room air.   RESPIRATIONS:  18.   PULSE:  78.   GENERAL APPEARANCE:  The patient is not in any distress.    CHEST/RESPIRATORY:  Clear air entry bilaterally.   CARDIOVASCULAR:  CARDIAC:   Heart sounds are normal.  There is no S3, no S4.  JVP is not elevated.  He appears to be euvolemic.   GASTROINTESTINAL:  LIVER/SPLEEN/KIDNEYS:  No liver, no spleen.  No tenderness is noted.   CIRCULATION:   EDEMA/VARICOSITIES:   Extremities:  He has significant venous stasis.   SKIN:  INSPECTION:  I will try to have a look at the wound on the plantar aspect of his foot when I am next in the building.    ASSESSMENT/PLAN:  Chest pain.  Ruled out for MI, although I believe he does have a history of coronary artery disease.    Status post cardiac arrest.  I will need to review his cardiac catheterization.   His ejection fraction was normal.    Prerenal azotemia.     Chronic renal failure.   This is mild.    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  He is on vancomycin.    Type 2 diabetes.  On Amaryl.  He has been tolerating this fairly well.  I took him off his insulin a month or so ago.    This man will need follow-up lab work.  We will monitor him for diarrhea.  I have increased his Coumadin somewhat.  Everything else is stable.  I do not think he actually requires oxygen.

## 2014-04-26 LAB — GLUCOSE, CAPILLARY
Glucose-Capillary: 80 mg/dL (ref 70–99)
Glucose-Capillary: 95 mg/dL (ref 70–99)

## 2014-04-27 LAB — GLUCOSE, CAPILLARY
GLUCOSE-CAPILLARY: 178 mg/dL — AB (ref 70–99)
GLUCOSE-CAPILLARY: 100 mg/dL — AB (ref 70–99)
Glucose-Capillary: 157 mg/dL — ABNORMAL HIGH (ref 70–99)
Glucose-Capillary: 196 mg/dL — ABNORMAL HIGH (ref 70–99)
Glucose-Capillary: 183 mg/dL — ABNORMAL HIGH (ref 70–99)

## 2014-04-28 LAB — GLUCOSE, CAPILLARY
Glucose-Capillary: 108 mg/dL — ABNORMAL HIGH (ref 70–99)
Glucose-Capillary: 130 mg/dL — ABNORMAL HIGH (ref 70–99)
Glucose-Capillary: 64 mg/dL — ABNORMAL LOW (ref 70–99)
Glucose-Capillary: 103 mg/dL — ABNORMAL HIGH (ref 70–99)

## 2014-04-28 NOTE — Progress Notes (Addendum)
Patient ID: Joshua [name], male   DOB: [birth year], 63 y.o.   MRN: [id number]                  PROGRESS NOTE  DATE:  04/25/2014     FACILITY: [facility]    LEVEL OF CARE:   SNF   Acute Visit   CHIEF COMPLAINT:  Review of left foot wound.    HISTORY OF PRESENT ILLNESS:  Mr. Oliveria is a diabetic who recently returned from the hospital secondary to chest pain with C.difficile.    He is also a diabetic.  I had changed him to oral agents, glimepiride 2 mg, some weeks ago.    His original admission here was for a left foot infection and he underwent surgical debridement.  He had open wounds on both the plantar and dorsal aspects of his foot.  The open area on the top of the foot has completely resolved.  There is still some adherent eschar on the plantar surface.  This may need debridement.    Lab work from today shows a white count of 7.1, hemoglobin 9.5.    He was noted to have heme-positive stools in the hospital.  He was seen by GI.   His hemoglobin at discharge was 10.3.  It is 9.5 today.  That will need to be followed.    He is on 9 mg of Coumadin.  His INR is 2.15.      PHYSICAL EXAMINATION:   SKIN:  INSPECTION:  Left foot:  The dorsal surgical site is totally resolved.  On the plantar aspect of his foot over his metatarsal head, there is some eschar here.  We have only been using a foam dressing to this.  This may need debridement.  I will have another look at this in discussing with the wound care nurse in two days.    ASSESSMENT/PLAN:  Cardiac status seems to be stable.    His congestive heart failure is stable.    Anemia with OB-positive stools.  His hemoglobin, again, has dropped.  I will monitor this in a week.   He is on Coumadin.  I will need to research this.  However, the INR is therapeutic.  He is on 9 mg.   ?DVT during one of his complicated admissions  CPT CODE: 52841

## 2014-04-29 ENCOUNTER — Other Ambulatory Visit: Payer: Self-pay | Admitting: Internal Medicine

## 2014-04-29 ENCOUNTER — Non-Acute Institutional Stay (SKILLED_NURSING_FACILITY): Payer: Self-pay | Admitting: Internal Medicine

## 2014-04-29 DIAGNOSIS — R768 Other specified abnormal immunological findings in serum: Secondary | ICD-10-CM

## 2014-04-29 DIAGNOSIS — N289 Disorder of kidney and ureter, unspecified: Secondary | ICD-10-CM

## 2014-04-29 DIAGNOSIS — I5032 Chronic diastolic (congestive) heart failure: Secondary | ICD-10-CM

## 2014-04-29 DIAGNOSIS — E1129 Type 2 diabetes mellitus with other diabetic kidney complication: Secondary | ICD-10-CM

## 2014-04-29 DIAGNOSIS — D649 Anemia, unspecified: Secondary | ICD-10-CM

## 2014-04-29 LAB — GLUCOSE, CAPILLARY
GLUCOSE-CAPILLARY: 91 mg/dL (ref 70–99)
GLUCOSE-CAPILLARY: 101 mg/dL — AB (ref 70–99)
Glucose-Capillary: 95 mg/dL (ref 70–99)
Glucose-Capillary: 163 mg/dL — ABNORMAL HIGH (ref 70–99)

## 2014-04-29 NOTE — Progress Notes (Signed)
Patient ID: Joshua [name], male   DOB: 1951-09-23, 63 y.o.   MRN: [id number]   This is an acute visit.  Level of care skilled.  Facility [facility].  Chief complaint-acute visit secondary to low blood sugars-followup CHF-chest pain.  History of present illness.  Patient is a very medically complex 63 year old male with a history of type 2 diabetes DVT chronic renal failure congestive heart failure.  There is limited in facility after having surgery on his left foot they had to drain an abscess this gradually has healed.  He was admitted to the hospital about a month ago with cardiac arrest while being evaluated for pseudomembranous colitis-he required ACLS and echo showed ejection fraction of 55 and 60%.  He had a left heart catheterization.  Most recently he complained of chest pain and shortness of breath--he was ruled out for an acute coronary syndrome-chest x-ray showed possible pneumonia.  He was treated with antibiotics in the shortness of breath resolved.  This continues to be stable he appears to be doing well cardiac and respiratory wise with no complaints of chest pain or shortness of breath.  He was noted to have an elevated BUN and creatinine-he was gently rehydrated and was started on a lower dose of Lasix 20 mg a day.  He also had stools that were Hemoccult positive he was taking iron GI saw him-his hemoglobin was stable and recommendation was to continue anticoagulation with no workup at this time.  It was also thought to be positive for CDF-he was given 2 weeks of vancomycin which he is completing.  In regard to diabetes again he is on Amaryl although blood sugars now appeared be running somewhat low --last Tuesdaysday blood sugar in the morning was 80----95 at 4:30-and 178 at at bedtime  On Wednesday blood sugar was 157in the morning-196 at noon-100 at 4 PM-183 at at bedtime.  Yesterday a.m. sugar was 108- At noon 130-4 PM 152-and at bedtime 64.  This morning it was  91.  I note on Monday he did have a list of blood sugar of 41 at 4; 30 was 80  in the morning.  Apparently this morning he was somewhat cold and clammy with a blo od sugar of 91he was given supplements and this did rise--and this quickly resolved  He did not complain of any shortness of breath or chest pain-.  Family medical social history as been reviewed per admission note on 04/25/2014.  Medications have been reviewed per MAR.  Review of systems.  Gen. he does not complaining of any fever or chills actually eating lunch and ate everything it appears.  Respiratory does not complaining of any shortness of breath or cough.  Cardiac no chest pain has fairly minimal lower extremity edema.  GI does not complaining of any nausea vomiting diarrhea or abdominal pain or constipation is completing vancomycin apparently for C. difficile although diarrhea has resolved it appears.  GU does not complain of dysuria.  Muscle skeletal is not complaining of joint pain has complained of some edema in the past but not today.  Neurologic does not complaining of dizziness headache or syncopal type episodes  Physical exam.  Temperature 98.3--pulse is 72 respirations 18 blood pressure manually 110/60 weight is 132 point  His skin is warm and generally dry---his back is a little sweaty but he does have a couple layers of clothing on.  Chest is clear to auscultation no labored breathing.  Heart is regular rate and rhythm without murmur gallop  or around he has minimal lower extremity edema.  Abdomen soft nontender with slightly hypoactive bowel sounds.  Extremities he does ambulate in a wheelchair he continues with venous stasis  Neurologic deficits grossly intact his speech is clear.  Psych appears alert and oriented pleasant and appropriate appears to be in good spirits.  Labs.  June 30 of 2:15.  INR 2.09.  04/25/2014.  WBC 7.1 hemoglobin 9.5 platelets 338.  Sodium 137 potassium 5.3  BUN 57 creatinine 1.60.  Assessment and plan.  #1-diabetes type 2-currently only on Amaryl his insulin was decreased previously by Dr. Charlott Holler sugars do appear to run low at times episodically will decrease Amaryl to 1 mg and continue to monitor clinically he appears stable.  #2 as history CHF his Lasix dose was reduced in hospital nonetheless it appears his weights are relatively stable currently 132 this appears actually lost about 3 pounds since the beginning of the week this is complicated with a history of renal insufficiency Will update a metabolic panel--clinically he appears to be doing well.  3-anemia -- this appears stable apparently despite issues in the hospital Will update CBC as well Next lab day--he continues on iron  CPT-99309.  Marland Kitchen

## 2014-04-30 LAB — GLUCOSE, CAPILLARY
GLUCOSE-CAPILLARY: 94 mg/dL (ref 70–99)
GLUCOSE-CAPILLARY: 111 mg/dL — AB (ref 70–99)
Glucose-Capillary: 187 mg/dL — ABNORMAL HIGH (ref 70–99)
Glucose-Capillary: 122 mg/dL — ABNORMAL HIGH (ref 70–99)

## 2014-05-01 LAB — GLUCOSE, CAPILLARY
GLUCOSE-CAPILLARY: 80 mg/dL (ref 70–99)
GLUCOSE-CAPILLARY: 89 mg/dL (ref 70–99)
GLUCOSE-CAPILLARY: 168 mg/dL — AB (ref 70–99)
Glucose-Capillary: 109 mg/dL — ABNORMAL HIGH (ref 70–99)

## 2014-05-02 ENCOUNTER — Non-Acute Institutional Stay (SKILLED_NURSING_FACILITY): Payer: Self-pay | Admitting: Internal Medicine

## 2014-05-02 DIAGNOSIS — I824Y9 Acute embolism and thrombosis of unspecified deep veins of unspecified proximal lower extremity: Secondary | ICD-10-CM

## 2014-05-02 DIAGNOSIS — E1129 Type 2 diabetes mellitus with other diabetic kidney complication: Secondary | ICD-10-CM

## 2014-05-02 DIAGNOSIS — I82439 Acute embolism and thrombosis of unspecified popliteal vein: Secondary | ICD-10-CM

## 2014-05-02 LAB — GLUCOSE, CAPILLARY
GLUCOSE-CAPILLARY: 151 mg/dL — AB (ref 70–99)
Glucose-Capillary: 133 mg/dL — ABNORMAL HIGH (ref 70–99)
Glucose-Capillary: 124 mg/dL — ABNORMAL HIGH (ref 70–99)

## 2014-05-03 LAB — GLUCOSE, CAPILLARY
Glucose-Capillary: 153 mg/dL — ABNORMAL HIGH (ref 70–99)
Glucose-Capillary: 151 mg/dL — ABNORMAL HIGH (ref 70–99)
Glucose-Capillary: 141 mg/dL — ABNORMAL HIGH (ref 70–99)
Glucose-Capillary: 142 mg/dL — ABNORMAL HIGH (ref 70–99)

## 2014-05-04 LAB — GLUCOSE, CAPILLARY
GLUCOSE-CAPILLARY: 97 mg/dL (ref 70–99)
GLUCOSE-CAPILLARY: 90 mg/dL (ref 70–99)
GLUCOSE-CAPILLARY: 163 mg/dL — AB (ref 70–99)
Glucose-Capillary: 144 mg/dL — ABNORMAL HIGH (ref 70–99)

## 2014-05-05 LAB — GLUCOSE, CAPILLARY
GLUCOSE-CAPILLARY: 107 mg/dL — AB (ref 70–99)
GLUCOSE-CAPILLARY: 88 mg/dL (ref 70–99)
GLUCOSE-CAPILLARY: 157 mg/dL — AB (ref 70–99)

## 2014-05-05 NOTE — Progress Notes (Signed)
Patient ID: Joshua [name], male   DOB: [birth year], 63 y.o.   MRN: [id number]                  PROGRESS NOTE  DATE:  05/02/2014    FACILITY: [facility]    LEVEL OF CARE:   SNF   Acute Visit   CHIEF COMPLAINT:  Follow up PT and INR, chronic renal failure, diabetes, etc.     HISTORY OF PRESENT ILLNESS:  Joshua [name] is a gentleman who came to [place] initially after having surgery for an infected diabetic foot abscess.  He had wound care.  Subsequently after this, he developed pseudomembranous colitis and while in Radiology developed a V-tach cardiac arrest from which he made a recovery.    When he came to [place] initially, he was on Coumadin.  Some of my initial notes state that this was for a popliteal DVT bilaterally.  The discharging hospitalist only states a popliteal DVT.  I have never been able to find the exact documentation for this and I have spent some time looking through the chart and still cannot find documentation of his DVT, although it would seem that this was sometime during the hospitalization in February.  Therefore, I think that the Coumadin can probably stop in late July.    With regards to his diabetes, he came here on insulin.  I transitioned him roughly a month ago to Amaryl at 2 mg.  This has been reduced to 1 mg as he had some late-in-the-day hypoglycemia in the 40s.   Yesterday's blood sugars were 80, 109, 89, 168.  Today, his fasting blood sugar is 84.    He is up walking with a walker.  There is no exertional shortness of breath, hypoxemia, or chest pain.  The patient states he is doing well.     REVIEW OF SYSTEMS:   HEENT:  He is complaining about his eyesight and he is supposed to go to Ophthalmology.   CHEST/RESPIRATORY:  Again, no clear exertional shortness of breath or exertional hypoxemia.   CARDIAC:   No complaints of chest pain or palpitations.    PHYSICAL EXAMINATION:   GENERAL APPEARANCE:  The patient continues to look better.     CHEST/RESPIRATORY:  Clear air entry bilaterally.   CARDIOVASCULAR:  CARDIAC:   Heart sounds are normal.  There are no murmurs.  No gallops.  He appears to be euvolemic.    LABORATORY DATA:  Lab work from today shows:    White count 6.1, hemoglobin 10.3.    BUN 84, creatinine 1.76, potassium 5.4.    ASSESSMENT/PLAN:  Popliteal vein DVT, ?bilateral, ?unilateral.  In any case, he came to this building on Coumadin.  He rapidly after this became un-anticoagulated and we had to give him Lovenox temporarily.  In looking through the records from this timeframe, I cannot see the justification for more prolonged  Coumadin can probably stop towards the end of July.  His INR is therapeutic on 9.5 mg.    Chronic renal failure.  Presumably diabetic.  I am going to continue the Amaryl at 1 mg.  He appears to be well controlled.    Potassium at 5.4.    Cardiac arrest.  His echocardiogram actually looked very good.  As far as I can see, he has not been in atrial fibrillation.  He has had no overt limitation in his activities, per the physical therapist.

## 2014-05-06 LAB — GLUCOSE, CAPILLARY
GLUCOSE-CAPILLARY: 130 mg/dL — AB (ref 70–99)
Glucose-Capillary: 111 mg/dL — ABNORMAL HIGH (ref 70–99)
Glucose-Capillary: 122 mg/dL — ABNORMAL HIGH (ref 70–99)

## 2014-05-07 LAB — GLUCOSE, CAPILLARY
GLUCOSE-CAPILLARY: 116 mg/dL — AB (ref 70–99)
GLUCOSE-CAPILLARY: 128 mg/dL — AB (ref 70–99)
Glucose-Capillary: 115 mg/dL — ABNORMAL HIGH (ref 70–99)
Glucose-Capillary: 180 mg/dL — ABNORMAL HIGH (ref 70–99)

## 2014-05-08 LAB — GLUCOSE, CAPILLARY
GLUCOSE-CAPILLARY: 134 mg/dL — AB (ref 70–99)
Glucose-Capillary: 100 mg/dL — ABNORMAL HIGH (ref 70–99)
Glucose-Capillary: 120 mg/dL — ABNORMAL HIGH (ref 70–99)

## 2014-05-09 LAB — GLUCOSE, CAPILLARY
GLUCOSE-CAPILLARY: 94 mg/dL (ref 70–99)
GLUCOSE-CAPILLARY: 144 mg/dL — AB (ref 70–99)
GLUCOSE-CAPILLARY: 160 mg/dL — AB (ref 70–99)
Glucose-Capillary: 152 mg/dL — ABNORMAL HIGH (ref 70–99)
Glucose-Capillary: 162 mg/dL — ABNORMAL HIGH (ref 70–99)

## 2014-05-10 ENCOUNTER — Non-Acute Institutional Stay (SKILLED_NURSING_FACILITY): Payer: Self-pay | Admitting: Internal Medicine

## 2014-05-10 ENCOUNTER — Encounter: Payer: Self-pay | Admitting: Internal Medicine

## 2014-05-10 DIAGNOSIS — R1030 Lower abdominal pain, unspecified: Secondary | ICD-10-CM | POA: Insufficient documentation

## 2014-05-10 DIAGNOSIS — R109 Unspecified abdominal pain: Secondary | ICD-10-CM

## 2014-05-10 DIAGNOSIS — I5032 Chronic diastolic (congestive) heart failure: Secondary | ICD-10-CM

## 2014-05-10 DIAGNOSIS — I10 Essential (primary) hypertension: Secondary | ICD-10-CM

## 2014-05-10 LAB — GLUCOSE, CAPILLARY
Glucose-Capillary: 109 mg/dL — ABNORMAL HIGH (ref 70–99)
Glucose-Capillary: 171 mg/dL — ABNORMAL HIGH (ref 70–99)

## 2014-05-10 NOTE — Progress Notes (Signed)
Patient ID: Joshua [name], male   DOB: 06-15-1951, 63 y.o.   MRN: [id number]   This is an acute visit.  Level care skilled.  Facility [facility].  Chief complaint-acute visit followup left thigh inguinal pain-left total issues.  Followup CHF.  HTN  History of present illness \ \Patient is a 63 year old male with a complex medical history including history of DVT CHF chronic renal failure he was hospitalized at one point with an acute MI-also recent hospitalization for pneumonia.  Despite this he appears to be relatively stable recently-his main complaint the last few days apparently with some left inguinal pain-he described this as a sharp pain at times.  However today he denies pain in says it has essentially resolved.  He also is complaining of some left total issues he would like his nail removed appears or fungal changes-he does have some history of a left foot infection that underwent surgical debridement also with a history of wounds on his feet however these apparently have healed.  His vital signs appear stable-at one point blood pressure appeared to be elevated but recently this appears to have moderated recent systolics [id number] I got 136 manually over 64 today--he is on clonidine 0.1 mg 3 times a day-hydralazine 50 mg every 8 hours-as well as lisinopril 30 mg a day.  He does not complaining of any chest pain syncopal episodes or palpitations.  He does have a history congestive heart failure although recent ejection fraction was within normal range-he is on 20 mg of Lasix a day he had been on potassium but recent potassium was 5.4 and this was discontinued updated potassium done yesterday was normal at 4.4.  His weight has been stable hovering around 130 pounds.  Family medical social history as been reviewed for readmission note on 04/20/2014.  Medications have been reviewed MAR.  Review of systems.  In general no complaints of fever or chills his weight has been  stable.  Skin does not complaining of rash or itching does have concern about the status of his left great toenail and would like it removed.  Respiratory does not complaining of any increased shortness of breath or cough.  Cardiac extensive history as noted above but did not complain of chest pain. Continues to have baseline lower extremity edema.  GI does not complaining of any abdominal pain nausea vomiting diarrhea constipation was recently see if.  Musculoskeletal does not complaining of joint pain currently although apparently has had foot issues in the past.  Neurologic is not complaining of any dizziness or headache.  Physical exam.  Temperature is 98.0 pulse 84 respirations 20 blood pressure 136/64 weight is stable at 129.9.  In general this is a pleasant late middle-aged male in no distress sitting comfortably in his chair.  His skin is warm and dry he does have a well-healed laceration scarred appears at the base of his left great toe this does not appear to be remarkable rash he does appear to have some decay of his left great toenail possible fungal changes I do not note any increased edema erythema or drainage or acute tenderness to palpation.  Oropharynx is clear mucous membranes moist.  Chest is clear to auscultation there is no labored breathing-I would say he has  Baseline to  improved lower extremity edema about 1+.  Abdomen is soft nontender with positive bowel sounds.  GU-I did assess his perineal area inguinal area-I did not note really any deformity or tenderness erythema or discomfort with any palpation  in the area-could not really appreciate a hernia  Muscle skeletal is able ambulate wheelchair and move all extremities x4.  Neurologic appears grossly intact his speech is clear.  Labs.  05/09/2014.  INR 2.62.  Sodium 134 potassium 4.4 BUN 55 creatinine 1.36.  05/02/2014.  WBC 6.1 hemoglobin 10.3 platelets 394.  Sodium 137 potassium 5.4 BUN 82  creatinine 1.76.  Assessment and plan.  #1- CHF-this appears at this point  Stable-- he is on low dose Lasix I suspect secondary to his renal insufficiency --his weight has been stable at this point continue to monitor.  #2-renal insufficiency-this appears actually somewhat improved from previous lab-he did have a somewhat high potassium which is normalized since he is off potassium however we will need to recheck this to make sure he doesn't go too low will check this first Lab day next week.  #3-toe issues-will order a podiatry consutl  #4-hypertension-this appears to have stabilized on current medications including lisinopril as well as hydralazine and clonidine.  #5-inguinal pain-at this point we'll monitor t-- exam is quite benign he does not complaining of any discomfort today--if  eoccurs would have to investigate possibly a hernia  [id number]

## 2014-05-11 ENCOUNTER — Telehealth: Payer: Self-pay | Admitting: Gastroenterology

## 2014-05-11 ENCOUNTER — Encounter: Payer: Self-pay | Admitting: Gastroenterology

## 2014-05-11 LAB — GLUCOSE, CAPILLARY
GLUCOSE-CAPILLARY: 129 mg/dL — AB (ref 70–99)
GLUCOSE-CAPILLARY: 154 mg/dL — AB (ref 70–99)
GLUCOSE-CAPILLARY: 158 mg/dL — AB (ref 70–99)
GLUCOSE-CAPILLARY: 175 mg/dL — AB (ref 70–99)
Glucose-Capillary: 152 mg/dL — ABNORMAL HIGH (ref 70–99)
Glucose-Capillary: 104 mg/dL — ABNORMAL HIGH (ref 70–99)

## 2014-05-11 NOTE — Telephone Encounter (Signed)
Called the [facility] and spoke to his nurse, Minette Brine, and informed her. Also, faxed the info to her at [phone].

## 2014-05-11 NOTE — Telephone Encounter (Signed)
PLEASE CALL PT. HIS SWALLOWING STUDY SHOWS A NORMAL ESOPHAGUS AND THE BARIUM TABLET PASSED WITHOUT DIFFICULTY. HE NEEDS E30 OPV SEP 2015 TO DISCUSS BENEFITS V. RISKS OF TCS/EGD TO COMPLETE THE EVALUATION FOR HIS LOW BLOOD COUNT.

## 2014-05-12 LAB — GLUCOSE, CAPILLARY
GLUCOSE-CAPILLARY: 115 mg/dL — AB (ref 70–99)
GLUCOSE-CAPILLARY: 172 mg/dL — AB (ref 70–99)
GLUCOSE-CAPILLARY: 152 mg/dL — AB (ref 70–99)
GLUCOSE-CAPILLARY: 100 mg/dL — AB (ref 70–99)

## 2014-05-13 LAB — GLUCOSE, CAPILLARY
GLUCOSE-CAPILLARY: 109 mg/dL — AB (ref 70–99)
Glucose-Capillary: 170 mg/dL — ABNORMAL HIGH (ref 70–99)
Glucose-Capillary: 144 mg/dL — ABNORMAL HIGH (ref 70–99)
Glucose-Capillary: 98 mg/dL (ref 70–99)

## 2014-05-14 LAB — GLUCOSE, CAPILLARY
GLUCOSE-CAPILLARY: 103 mg/dL — AB (ref 70–99)
GLUCOSE-CAPILLARY: 123 mg/dL — AB (ref 70–99)
Glucose-Capillary: 174 mg/dL — ABNORMAL HIGH (ref 70–99)

## 2014-05-15 LAB — GLUCOSE, CAPILLARY
GLUCOSE-CAPILLARY: 84 mg/dL (ref 70–99)
GLUCOSE-CAPILLARY: 177 mg/dL — AB (ref 70–99)

## 2014-05-16 LAB — GLUCOSE, CAPILLARY
Glucose-Capillary: 92 mg/dL (ref 70–99)
Glucose-Capillary: 83 mg/dL (ref 70–99)
Glucose-Capillary: 136 mg/dL — ABNORMAL HIGH (ref 70–99)
Glucose-Capillary: 177 mg/dL — ABNORMAL HIGH (ref 70–99)

## 2014-05-17 LAB — GLUCOSE, CAPILLARY: GLUCOSE-CAPILLARY: 111 mg/dL — AB (ref 70–99)

## 2014-05-18 LAB — GLUCOSE, CAPILLARY
GLUCOSE-CAPILLARY: 127 mg/dL — AB (ref 70–99)
Glucose-Capillary: 112 mg/dL — ABNORMAL HIGH (ref 70–99)
Glucose-Capillary: 159 mg/dL — ABNORMAL HIGH (ref 70–99)
Glucose-Capillary: 171 mg/dL — ABNORMAL HIGH (ref 70–99)
Glucose-Capillary: 90 mg/dL (ref 70–99)

## 2014-05-18 NOTE — Telephone Encounter (Signed)
Reminder in epic °

## 2014-05-19 LAB — GLUCOSE, CAPILLARY
Glucose-Capillary: 100 mg/dL — ABNORMAL HIGH (ref 70–99)
Glucose-Capillary: 126 mg/dL — ABNORMAL HIGH (ref 70–99)
Glucose-Capillary: 145 mg/dL — ABNORMAL HIGH (ref 70–99)

## 2014-05-20 LAB — GLUCOSE, CAPILLARY
GLUCOSE-CAPILLARY: 111 mg/dL — AB (ref 70–99)
GLUCOSE-CAPILLARY: 140 mg/dL — AB (ref 70–99)
Glucose-Capillary: 122 mg/dL — ABNORMAL HIGH (ref 70–99)
Glucose-Capillary: 116 mg/dL — ABNORMAL HIGH (ref 70–99)

## 2014-05-21 LAB — GLUCOSE, CAPILLARY
Glucose-Capillary: 87 mg/dL (ref 70–99)
Glucose-Capillary: 125 mg/dL — ABNORMAL HIGH (ref 70–99)
Glucose-Capillary: 130 mg/dL — ABNORMAL HIGH (ref 70–99)

## 2014-05-22 LAB — GLUCOSE, CAPILLARY
GLUCOSE-CAPILLARY: 101 mg/dL — AB (ref 70–99)
GLUCOSE-CAPILLARY: 122 mg/dL — AB (ref 70–99)
GLUCOSE-CAPILLARY: 183 mg/dL — AB (ref 70–99)
GLUCOSE-CAPILLARY: 123 mg/dL — AB (ref 70–99)
GLUCOSE-CAPILLARY: 145 mg/dL — AB (ref 70–99)

## 2014-05-23 LAB — GLUCOSE, CAPILLARY
GLUCOSE-CAPILLARY: 110 mg/dL — AB (ref 70–99)
Glucose-Capillary: 240 mg/dL — ABNORMAL HIGH (ref 70–99)
Glucose-Capillary: 146 mg/dL — ABNORMAL HIGH (ref 70–99)
Glucose-Capillary: 117 mg/dL — ABNORMAL HIGH (ref 70–99)

## 2014-05-24 ENCOUNTER — Non-Acute Institutional Stay (SKILLED_NURSING_FACILITY): Payer: Self-pay | Admitting: Internal Medicine

## 2014-05-24 ENCOUNTER — Encounter: Payer: Self-pay | Admitting: Internal Medicine

## 2014-05-24 DIAGNOSIS — R5383 Other fatigue: Secondary | ICD-10-CM

## 2014-05-24 DIAGNOSIS — I10 Essential (primary) hypertension: Secondary | ICD-10-CM

## 2014-05-24 DIAGNOSIS — R531 Weakness: Secondary | ICD-10-CM | POA: Insufficient documentation

## 2014-05-24 DIAGNOSIS — I5032 Chronic diastolic (congestive) heart failure: Secondary | ICD-10-CM

## 2014-05-24 DIAGNOSIS — R5381 Other malaise: Secondary | ICD-10-CM

## 2014-05-24 LAB — GLUCOSE, CAPILLARY
Glucose-Capillary: 102 mg/dL — ABNORMAL HIGH (ref 70–99)
Glucose-Capillary: 176 mg/dL — ABNORMAL HIGH (ref 70–99)
Glucose-Capillary: 76 mg/dL (ref 70–99)
Glucose-Capillary: 176 mg/dL — ABNORMAL HIGH (ref 70–99)

## 2014-05-24 NOTE — Progress Notes (Signed)
Patient ID: Joshua [name], male   DOB: 11-11-1951, 63 y.o.   MRN: [id number]   This is an acute visit.  Level of care skilled.  Facility [facility]  Chief complaint-acute visit secondary to generalized weakness.  History of present illness.  Patient is a 63 year old male with a very complex medical history including diabetes type 2 history of DVT chronic renal failure congestive heart failure.  He was admitted to the hospital with cardiac arrest in April at that time he was being evaluated for pseudomembranous colitis.  Most recently hospitalized back in May for chest pain and shortness of breath he was ruled out for an acute coronary syndrome.  Chest x-ray indicated possible pneumonia he was treated with antibiotics and the shortness of breath resolved.  Also felt to have an elevated BUN and creatinine secondary to prerenal factors he was rehydrated and started on a lower dose of Lasix currently 20 mg a day.  Also had hemi-positive stools seen by GI hemoglobin was stable and recommendation was to continue his anticoagulation with Coumadin.  He was thought to be positive for C. difficile and has completed a course of oral vancomycin.  He has done quite well the last several weeks however he has complained and nursing staff today that feeling generally weak-he denies any chest pain or shortness of breath with this any nausea or vomiting apparently he is eating his meals quite well today says he just feels weak feels his vitamin B12 is low he is not on B12.  He does have a history of hypertension I see frequent systolics in the [id number] are -- 158/92 earlier today.  He is on clonidine 0.1 mg 3 times a day Lasix 20 mg a day lisinopril 30 mg a day and hydralazine 50 mg 3 times a day.  It appears his blood pressure appears to drop later in the day possibly after he gets his medicines although this is not consistent-I took his blood pressure manually today and got 98/54 this was mid  afternoon.  He does not complaining of any dizziness or syncopal-type feelings even when he stood up.  Family medical social history as been reviewed per admission note on 04/20/2014.  Medications have been reviewed per MAR.  Review of systems.  General no complaints of fever or chills says he just feels weak.  Skin does not complain of any diaphoresis.  Head ears eyes nose mouth and throat-does not complaining of dysphagia does not complaining of acute visual changes does have a history of cataracts.  Respiratory no shortness of breath or cough.  Cardiac denies any chest pain edema appears significantly improved.  GI is not complaining of any abdominal pain nausea or vomiting  GU-denies any dysuria or burning with urination.  Muscle skeletal does not complaining of joint pain -- says he feels generally weak.  Neurologic no headache dizziness or syncopal-type feelings  Physical exam.  Temperature is 97.6 respirations 93 blood pressure 18 blood pressure earlier today 158/92 again I retook it and got the 98/54 his weight is stable at 132.5.  In general this is a somewhat frail middle-aged male in no distress sitting comfortably in his chair.  His skin is warm and dry.  Eyes he does have a history of cataracts visual acuity appears grossly intact.  Oropharynx clear mucous membranes moist tongue is midline with full range of motion.  Chest is clear to auscultation no labored breathing.  Heart is regular rate and rhythm without murmur gallop or rub he  has minimal lower extremity edema.  Abdomen soft nontender with positive bowel sounds.  GU-could not appreciate any suprapubic tenderness  Muscle skeletal moves all his extremities at baseline gript strength is intact shoulder shrug intact I do not note any deformities he is able to stand without assistance and walk with his walker although his nurse thinks he's a little bit more unsteady than usual  Her lites are-appears  grossly intact I do not really see any lateralizing findings his speech is clear with cranial nerves grossly intact.  Psych he is alert and oriented x3 pleasant and appropriate.  Labs.  05/21/2014.  INR 2.15.  05/16/2014.  Sodium 135 potassium 5.1 BUN 63 creatinine 1.51.  05/02/2014.  WBC 6.1 hemoglobin 10.3 platelets 394.  Sodium 137 potassium 5.4 BUN 82 creatinine 1.76.  Assessment and plan.  #1-generalized weakness-physical exam is fairly baseline I did not see any focal neurologic deficits-at this point will monitor in order lab work including a CBC with differential CMP and TSH tomorrow--also vitamin B12 level a.m.-also monitor vital signs pulse ox every shift S. Lung exam is quite benign he denies any dysuria.  #2-hypertension-this appears to be quite variable with some low blood pressure readings later in the day at times after he receives his meds-is not symptomatic-at this point will continue to monitor somewhat challenging situation but clinically he appears stable in this regards-this was discussed with Dr. Dellia Nims via phone.  #3-history CHF-this appears stable despite being on the lower dose of Lasix edema is quite minimal weights have been stable he does not complaining of any chest pain or shortness of breath  [id number]  .    Marland Kitchen

## 2014-05-25 ENCOUNTER — Non-Acute Institutional Stay (SKILLED_NURSING_FACILITY): Payer: Self-pay | Admitting: Internal Medicine

## 2014-05-25 DIAGNOSIS — E1129 Type 2 diabetes mellitus with other diabetic kidney complication: Secondary | ICD-10-CM

## 2014-05-25 DIAGNOSIS — N178 Other acute kidney failure: Secondary | ICD-10-CM

## 2014-05-25 DIAGNOSIS — E1165 Type 2 diabetes mellitus with hyperglycemia: Secondary | ICD-10-CM

## 2014-05-25 DIAGNOSIS — E875 Hyperkalemia: Secondary | ICD-10-CM

## 2014-05-25 LAB — GLUCOSE, CAPILLARY
GLUCOSE-CAPILLARY: 104 mg/dL — AB (ref 70–99)
GLUCOSE-CAPILLARY: 194 mg/dL — AB (ref 70–99)
Glucose-Capillary: 235 mg/dL — ABNORMAL HIGH (ref 70–99)
Glucose-Capillary: 115 mg/dL — ABNORMAL HIGH (ref 70–99)

## 2014-05-26 LAB — GLUCOSE, CAPILLARY
GLUCOSE-CAPILLARY: 149 mg/dL — AB (ref 70–99)
Glucose-Capillary: 101 mg/dL — ABNORMAL HIGH (ref 70–99)
Glucose-Capillary: 156 mg/dL — ABNORMAL HIGH (ref 70–99)

## 2014-05-26 NOTE — Progress Notes (Addendum)
Patient ID: Joshua [name], male   DOB: 05-02-1951, 63 y.o.   MRN: [id number]                   PROGRESS NOTE  DATE:  05/25/2014    FACILITY: [facility]    LEVEL OF CARE:   SNF   Acute Visit   CHIEF COMPLAINT:  Follow up worsening renal insufficiency, hyperkalemia.    HISTORY OF PRESENT ILLNESS:  This patient was seen by our service yesterday.  He complained of generalized fatigue.  Lab work has been ordered and I have reviewed this and examined the patient.    He has today a sodium of 139, a potassium of 5.9, a CO2 of 21.  His BUN is 96, creatinine of 1.94.    He also has elevated liver function tests with an alk phos of 223, an AST of 41, and an ALT of 64.    With regards to the BUN of 96 and creatinine of 1.94, this compares with 63 and 1.51 on 05/16/2014, 55 and 1.36 on 05/09/2014, and 43 and 1.29 on 04/21/2014.  He is on Lasix 20 mg once a day and lisinopril 30 mg a day.    With regards to the liver function tests, this is not particularly new.  I think in one of his hospitalizations, he had acalculous cholecystitis although I will need to re-review this in [facility] before I can make a proper judgement on this.    REVIEW OF SYSTEMS:   GENERAL:  The patient really does not admit to anything, although he was somewhat flippant with me and I wonder if he would tell me if something was bothering him in any case.   CHEST/RESPIRATORY:  No complaints of shortness of breath.   CARDIAC:   No exertional chest pain.    GI:  No nausea, vomiting or diarrhea.   HEENT:  Tells me he is booked for cataract surgery.    PHYSICAL EXAMINATION:   GENERAL APPEARANCE:  The patient is not in any distress.  Was in [employer].  Walks back to his room with a wide-based, valgus at the knee type gait.  I suspect he has foot drop on the left and probably a diabetic neuropathy.   CHEST/RESPIRATORY:  Shallow, but otherwise clear air entry.   CARDIOVASCULAR:  CARDIAC:   Heart sounds are  normal.  He does not appear to be significantly dehydrated.  His JVP is not elevated.   GASTROINTESTINAL:  LIVER/SPLEEN/KIDNEYS:  No liver, no spleen.    ABDOMEN:  No right upper quadrant tenderness.   GENITOURINARY:  BLADDER:   Not distended.   CIRCULATION:   EDEMA/VARICOSITIES:  Extremities:  Venous stasis, but no evidence of a DVT.    ASSESSMENT/PLAN:  Acute-on-chronic renal failure in the setting of type 2 diabetes.  At this point, I think this is probably mostly his Lasix and lisinopril.  He does have some degree of chronic renal insufficiency, although his BUN and creatinine have clearly been increasing over the course of the month of June.  Currently, his BUN at 96 and creatinine of 1.94 are unacceptable.  His CBC is essentially unremarkable.  His Lasix and lisinopril are going to need to be put on hold.  I am going to try to give him 2 L of normal saline slowly.    Hyperkalemia.  Related to #1.  He is not on iron supplements.  He has had episodically elevated potassium.  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 Single dose of kayexalate.   Elevated liver function tests.  As mentioned, I am fairly certain he had acalculous cholecystitis at some point.  I will need to review this part of this.

## 2014-05-27 ENCOUNTER — Non-Acute Institutional Stay (SKILLED_NURSING_FACILITY): Payer: Self-pay | Admitting: Internal Medicine

## 2014-05-27 ENCOUNTER — Encounter: Payer: Self-pay | Admitting: Internal Medicine

## 2014-05-27 DIAGNOSIS — I5032 Chronic diastolic (congestive) heart failure: Secondary | ICD-10-CM

## 2014-05-27 DIAGNOSIS — N289 Disorder of kidney and ureter, unspecified: Secondary | ICD-10-CM

## 2014-05-27 DIAGNOSIS — I10 Essential (primary) hypertension: Secondary | ICD-10-CM

## 2014-05-27 LAB — GLUCOSE, CAPILLARY
GLUCOSE-CAPILLARY: 113 mg/dL — AB (ref 70–99)
GLUCOSE-CAPILLARY: 117 mg/dL — AB (ref 70–99)
Glucose-Capillary: 97 mg/dL (ref 70–99)
Glucose-Capillary: 193 mg/dL — ABNORMAL HIGH (ref 70–99)

## 2014-05-28 LAB — GLUCOSE, CAPILLARY
Glucose-Capillary: 145 mg/dL — ABNORMAL HIGH (ref 70–99)
Glucose-Capillary: 90 mg/dL (ref 70–99)
Glucose-Capillary: 107 mg/dL — ABNORMAL HIGH (ref 70–99)

## 2014-05-29 LAB — GLUCOSE, CAPILLARY
GLUCOSE-CAPILLARY: 176 mg/dL — AB (ref 70–99)
Glucose-Capillary: 118 mg/dL — ABNORMAL HIGH (ref 70–99)
Glucose-Capillary: 205 mg/dL — ABNORMAL HIGH (ref 70–99)
Glucose-Capillary: 135 mg/dL — ABNORMAL HIGH (ref 70–99)

## 2014-05-29 NOTE — Progress Notes (Signed)
Patient ID: Joshua [name], male   DOB: [birth year], 63 y.o.   MRN: [id number]   This is an acute visit.  Level of care skilled.  Facility [facility].  Date is 05/27/2014.  Chief complaint-acute visit followup renal insufficiency hyperkalemia.  History of present illness.  Patient is a 63 year old male with a complex medical history including congestive heart failure he is on low dose Lasix however recent metabolic panel was concerning for a creatinine which had risen to 1.94 which is somewhat above his baseline more in the mid ones.  Potassium also was elevated at 5.9.  Dr. Dellia Nims did evaluate him and held his Lasix and lisinopril he also got a dose of Kayexalate and a couple liters of IV fluids.  His labs look improved today with a creatinine of 1.43 BUN of 72 his potassium is 4.6.  He initially complained of fatigue when I saw him earlier this week but he says he is feeling significantly better.  Family medical social history as been reviewed per previous progress notes including 05/25/2014.  Medications have been reviewed per MAR.  Review of systems.  General no complaints of fever or chills.  Respiratory no cough or shortness of breath.  Cardiac does not complaining of chest pain edema appears fairly minimal and controlled.  Muscle skeletal is not complaining of weakness or acute pain at this time.  Neurologic does not complaining of dizziness or headache.  Physical exam.  Temperature is 98.1 pulse 84 respirations 20 blood pressure 138/80/100/54-in this range weight is 136.8 this appears to be a gain of 2-4 pounds in the last 10 days or so.  In general this is a late middle-aged male in no distress sitting comfortably.  The skin is warm and dry.  Chest is clear to auscultation there is no labored breathing.  Heart is regular rate and rhythm without murmur gallop or rub he does not have any significant sacral edema and trace lower extremity edema more so in the  ankles.  Abdomen is soft nontender with positive bowel sounds.  Labs.  05/27/2014.  Sodium 139 potassium 4.6 BUN 72 creatinine 1.43.  05/25/2014.  Sodium 139 potassium 5.9 BUN 96 creatinine 1.94.  The WBC 7.0 hemoglobin 12.2 platelets 424.  Assessment and plan.  #1-renal insufficiency with hyperkalemia-I suspect these are related and have improved with holding of Lasix and lisinopril as well as the IV fluids staff-clinically he appears stable his edema has not greatly increased although his wife will have to be monitored closely.  Will restart his lisinopril 10 mg daily-continue to hold Lasix for now again with monitoring of his edema and weight -- this plan was discussed with Dr. Dellia Nims via phone.  An updated metabolic panel is pending for Monday  [id number].

## 2014-05-30 ENCOUNTER — Non-Acute Institutional Stay (SKILLED_NURSING_FACILITY): Payer: Self-pay | Admitting: Internal Medicine

## 2014-05-30 DIAGNOSIS — N178 Other acute kidney failure: Secondary | ICD-10-CM

## 2014-05-30 DIAGNOSIS — I5032 Chronic diastolic (congestive) heart failure: Secondary | ICD-10-CM

## 2014-05-30 LAB — GLUCOSE, CAPILLARY
Glucose-Capillary: 107 mg/dL — ABNORMAL HIGH (ref 70–99)
Glucose-Capillary: 107 mg/dL — ABNORMAL HIGH (ref 70–99)

## 2014-05-30 NOTE — Progress Notes (Signed)
Patient ID: Joshua [name], male   DOB: [birth year], 63 y.o.   MRN: [id number]                    PROGRESS NOTE  DATE:  05/30/2014    FACILITY: [facility]    LEVEL OF CARE:   SNF   Acute Visit   CHIEF COMPLAINT:  Follow up worsening renal insufficiency, hyperkalemia.    HISTORY OF PRESENT ILLNESS:  This patient was seen by our service last week.  He complained of generalized fatigue.  Lab work was ordered.    He had a sodium of 139, a potassium of 5.9, a CO2 of 21.  His BUN is 96, creatinine of 1.94.  In response to this I held his Lasix and lisinopril and gave him 2 L of saline.  He also has elevated liver function tests with an alk phos of 223, an AST of 41, and an ALT of 64.    His followup BUN and creatinine were 72 and 1.43 on 7/3, and 70 and 1.54 respectively today. This seems to compare with the baseline on May 28 of 43 and 1.29. His weights have gone up somewhat today at 172/83 and a weight of 134.7 although in terms of the weights I don't see any consistent trend upward.  REVIEW OF SYSTEMS:   GENERAL:  The patient really does not admit to anything, although he was somewhat flippant with me and I wonder if he would tell me if something was bothering him in any case.   CHEST/RESPIRATORY:  No complaints of shortness of breath.   CARDIAC:   No exertional chest pain.  He is complaining about edema on the bottom of his feet GI:  No nausea, vomiting or diarrhea.   HEENT:  Tells me he is booked for cataract surgery.     PHYSICAL EXAMINATION:   GENERAL APPEARANCE:  The patient is not in any distress.  Was in [employer].  Walks back to his room with a wide-based, valgus at the knee type gait.  I suspect he has foot drop on the left and probably a diabetic neuropathy.   CHEST/RESPIRATORY:  Shallow, but otherwise clear air entry.   CARDIOVASCULAR:  CARDIAC:   Heart sounds are normal.  He does not appear to be significantly dehydrated.  His JVP is not elevated.     GASTROINTESTINAL:  LIVER/SPLEEN/KIDNEYS:  No liver, no spleen.    ABDOMEN:  No right upper quadrant tenderness.   GENITOURINARY:  BLADDER:   Not distended.   CIRCULATION:   EDEMA/VARICOSITIES:  Extremities:  Venous stasis, but no evidence of a DVT.  There is minimal edema   ASSESSMENT/PLAN  Acute on chronic renal failure; I suspect this was some combination of the Lasix and lisinopril. I have reintroduced lisinopril at 10 mg in his BUN has remained stable however his creatinine has gone from 1.43-1.54. This may be all the lisinopril he can tolerate. If his blood pressure remains high he will likely need another antihypertensive agent. I do not think he currently needs any additional Lasix.  Elevated liver function tests. He is totally asymptomatic and have not point to aggressively follow this for a moment.  Type 2 diabetes on Amaryl. His blood sugars are mostly in the low to mid 100s I think we can decrease the frequency of this    Question home discharge; we may start to work on this although there are many medical/social issues. She for this he does  not appear to have current insurance, money for medications.

## 2014-05-31 LAB — GLUCOSE, CAPILLARY: Glucose-Capillary: 208 mg/dL — ABNORMAL HIGH (ref 70–99)

## 2014-06-01 LAB — GLUCOSE, CAPILLARY
Glucose-Capillary: 112 mg/dL — ABNORMAL HIGH (ref 70–99)
Glucose-Capillary: 95 mg/dL (ref 70–99)
Glucose-Capillary: 116 mg/dL — ABNORMAL HIGH (ref 70–99)

## 2014-06-01 NOTE — Progress Notes (Signed)
Quick Note:  I informed Vickie at the [facility] and she said it is ok for Manuela Schwartz to call and schedule the appt for the pt. ______

## 2014-06-01 NOTE — Progress Notes (Signed)
Quick Note:  Patient with positive AMA. 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 Please arrange for OV with [facility] only to discuss possibility of liver bx vs empiric trial of URSO given multiple comorbidities. ______

## 2014-06-03 LAB — GLUCOSE, CAPILLARY: Glucose-Capillary: 118 mg/dL — ABNORMAL HIGH (ref 70–99)

## 2014-06-04 LAB — GLUCOSE, CAPILLARY: GLUCOSE-CAPILLARY: 111 mg/dL — AB (ref 70–99)

## 2014-06-06 LAB — GLUCOSE, CAPILLARY
GLUCOSE-CAPILLARY: 90 mg/dL (ref 70–99)
GLUCOSE-CAPILLARY: 88 mg/dL (ref 70–99)

## 2014-06-08 LAB — GLUCOSE, CAPILLARY
GLUCOSE-CAPILLARY: 149 mg/dL — AB (ref 70–99)
Glucose-Capillary: 132 mg/dL — ABNORMAL HIGH (ref 70–99)

## 2014-06-10 LAB — GLUCOSE, CAPILLARY
GLUCOSE-CAPILLARY: 93 mg/dL (ref 70–99)
Glucose-Capillary: 94 mg/dL (ref 70–99)

## 2014-06-13 LAB — GLUCOSE, CAPILLARY
GLUCOSE-CAPILLARY: 93 mg/dL (ref 70–99)
Glucose-Capillary: 107 mg/dL — ABNORMAL HIGH (ref 70–99)

## 2014-06-15 ENCOUNTER — Non-Acute Institutional Stay (SKILLED_NURSING_FACILITY): Payer: Self-pay | Admitting: Internal Medicine

## 2014-06-15 DIAGNOSIS — R531 Weakness: Secondary | ICD-10-CM

## 2014-06-15 DIAGNOSIS — R5381 Other malaise: Secondary | ICD-10-CM

## 2014-06-15 DIAGNOSIS — R5383 Other fatigue: Secondary | ICD-10-CM

## 2014-06-15 DIAGNOSIS — I5032 Chronic diastolic (congestive) heart failure: Secondary | ICD-10-CM

## 2014-06-15 DIAGNOSIS — E1129 Type 2 diabetes mellitus with other diabetic kidney complication: Secondary | ICD-10-CM

## 2014-06-15 DIAGNOSIS — I10 Essential (primary) hypertension: Secondary | ICD-10-CM

## 2014-06-15 DIAGNOSIS — E1321 Other specified diabetes mellitus with diabetic nephropathy: Secondary | ICD-10-CM

## 2014-06-15 DIAGNOSIS — R748 Abnormal levels of other serum enzymes: Secondary | ICD-10-CM

## 2014-06-15 DIAGNOSIS — N058 Unspecified nephritic syndrome with other morphologic changes: Secondary | ICD-10-CM

## 2014-06-15 LAB — GLUCOSE, CAPILLARY
Glucose-Capillary: 94 mg/dL (ref 70–99)
Glucose-Capillary: 121 mg/dL — ABNORMAL HIGH (ref 70–99)
Glucose-Capillary: 143 mg/dL — ABNORMAL HIGH (ref 70–99)

## 2014-06-15 NOTE — Progress Notes (Signed)
Patient ID: Joshua [name], male   DOB: 12-25-1950, 63 y.o.   MRN: [id number]   This is an acute visit.  Level care skilled.  Facility [facility].  Chief complaint acute visit secondary to patient feeling weak.  History of present illness.  Patient is a 63 year old male with a complicated medical history including congestive heart failure history of chronic renal failure-history of cardiac arrest in late April.  He actually has been quite stable recently however today he was outside in the heat for a while and came back saying he felt weak.  His vital signs remained stable and after sitting in the cool for a few minutes he said he felt significantly better.  He did not complain of any chest pain or shortness of breath.  He does have a history of renal insufficiency with a high potassium at one point his lisinopril and Lasix were held as lisinopril has been restarted on a lower dose.   he is no longer on Lasix although clinically he has been stable.  Per chart review it appears he's gained about 5 pounds in the past week-although there has been some variability in the past-it appears he's gained about 3 pounds since the beginning of the month.  He continues to deny any chest pain or shortness of breath again the edema does not appear significantly increased.  According to nursing staff he has a very good appetite.  Family medical social history as been reviewed per admission note on 04/20/2014.  Medications have been reviewed per MAR.  Review of systems.  General does not complain of fever chills initially complained of feeling weak after coming from outside however sitting in the cool for a few minutes he appeared to be back at baseline saying he felt okay.  Head ears eyes nose mouth and throat-did not complain of any visual changes or sore throat.  Skin did not complain of rash or diaphoresis.  Respiratory no complaints of cough or shortness of breath.  Cardiac no chest pain  does not really have significant edema at this time.  GI no complaints of nausea vomiting abdominal pain.  Muscle skeletal is not complaining of any joint pain again did complain of some weakness initially which appears to have resolved.  Neurologic is not complaining of any dizziness or headaches.  Physical exam.  Temperature is 98.2 pulse of 80 respirations 18 blood pressure 114/57 O2 saturation 97% on room air CBG is 121.  In general this is a somewhat frail middle-aged male in no distress sitting comfortably in his chair appeared to be somewhat anxious initially but by the time I left the room appeared to be feeling better and back to his baseline.  His skin is warm and dry.  Chest is clear to auscultation there is no labored breathing.  Heart is regular rate and rhythm without murmur gallop or JVP does not appear to be elevated he has not really had significant lower extremity edema or sacral edema.  Abdomen soft nontender positive bowel sounds.  Muscle skeletal appears to move all extremities x4 ambulates at his baseline with a somewhat wide-based gait which is not new.  Neurologic appears grossly intact his speech is clear no lateralizing findings.  Psych he is alert and oriented x3 again initially appeared somewhat anxious at this appeared to improve by the end of our visit.  Labs.  Like 20th 2015.  INR 2.58.  06/06/2014.  Sodium 135 potassium 4.9 BUN 87 creatinine 2.03.  [place] 2015.  TSH  1.75.  Vitamin B12 1629.  05/30/2014.  Liver function tests within normal limits except alkaline phosphatase 171 albumin of 2.6 at that time creatinine was 1.54 with BUN of 70.  05/25/2014.  WBC 7.0 hemoglobin 12.2 platelets 424.  Assessment and plan.  #1-weakness-this appears to have fairly quickly resolved once the patient was in a cool environment did advise him to try to avoid going outdoors in the heat he expressed understanding-reevaluatd him later and clinically  he appeared to be stable actually was later walking  about the facility appears to be back at his baseline.--Monitor vital signs every shift  #2 history of CHF-this is complicated with renal insufficiency he has been off his Lasix for some time now nonetheless clinically appears stable he has gained some weight if recent weight is accurate-would like to update weight tomorrow notify provider any weight greater  than 137 pounds--I do not see any increased edema-he does eat very well which certainly could contribute to weight gain  #3-hypertension that's actually this appears to be relatively stable he is on clonidine 0.1 mg 3 times a day as well as lisinopril 10 mg a day again this was restarted after being held secondary to renal and potassium issues-he is also on hydralazine 50 mg 3 times a day-blood pressure is 114/57 111/58 150/93 some variability but I do not see consistency here.  #4-history of anemia he is on iron we'll update CBC tomorrow.  #5-history renal insufficiency as stated above complicated with his history of CHF will update a metabolic panel tomorrow keep an eye on his renal function and potassium level.  #6-history diabetes he is on Amaryl-CBG's  were recently reduced secondary to stability of his morning sugars run 90-118--p.m. 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

## 2014-06-17 ENCOUNTER — Non-Acute Institutional Stay (SKILLED_NURSING_FACILITY): Payer: Self-pay | Admitting: Internal Medicine

## 2014-06-17 DIAGNOSIS — N289 Disorder of kidney and ureter, unspecified: Secondary | ICD-10-CM

## 2014-06-17 DIAGNOSIS — R748 Abnormal levels of other serum enzymes: Secondary | ICD-10-CM

## 2014-06-17 DIAGNOSIS — I5032 Chronic diastolic (congestive) heart failure: Secondary | ICD-10-CM

## 2014-06-17 DIAGNOSIS — E875 Hyperkalemia: Secondary | ICD-10-CM

## 2014-06-17 LAB — GLUCOSE, CAPILLARY: Glucose-Capillary: 92 mg/dL (ref 70–99)

## 2014-06-17 NOTE — Progress Notes (Signed)
Patient ID: Joshua [name], male   DOB: June 02, 1951, 63 y.o.   MRN: [id number]   This is an acute visit.  Level care skilled.  Facility [facility].  Chief complaint-acute visit secondary to renal insufficiency-hyperkalemia.  History of present illness.  Patient is a 63 year old male with a complex medical history including congestive heart failure renal insufficiency.  I saw him earlier this week after he had been out in the hot weather for a while and said he did not feel well however this quickly resolved once he got back into a nurse.  Labs were ordered however which did show an elevated creatinine of 2.01 a BUN of 97 and a potassium of 6.0.  He does have a history of renal insufficiency with creatinine around 1.5-.  Earlier this month his creatinine did rise to 1.94 her potassium was elevated as well-he did receive Kayexalate his Lasix was held and lisinopril was held as well-  His lisinopril was started at a lower dose once his renal function returned to baseline. Another issue has been his weight there was a note of increased weight gain this week with a weight of 139 somewhat increased from his baseline in the mid 130s-however repeat weight yesterday was 137.2 he continues to not really have any significant edema-and again appears to be relatively asymptomatic with no complaints of shortness of breath or chest pain    Clinically patient appears to be stable blood pressure is stable I reviewed Dr. 140/70 manually today he does not complaining of any dizziness syncopal-type feelings or palpitations or chest pain.  Family medical social history has been reviewed in previous progress notes most recently 06/15/2014.  Medications have been reviewed currently are.  Review of systems.  In general does not complaining of any fever chills.  Skin does not complaining of rash or itching.  Respiratory no shortness of breath or complaints of cough.  Cardiac no chest pain or significant  edema.  GI does not complaining of any abdominal discomfort nausea or vomiting diarrhea or constipation.  Muscle skeletal is not complaining of joint pain except occasionally some left foot discomfort which is chronic.  Neurologic does not complaining of any dizziness headache syncopal type episodes.  Physical exam.  Temperature is 98.2 pulse 84 respirations 18 blood pressure 140/70 manually it appears previous readings range 131/70-167/85 I suspect these were taken by machine.  In general this is a pleasant male in no distress sitting comfortably in his chair.  His skin is warm and dry.  Oropharynx clear mucous membranes moist.  Chest is clear to auscultation there is no labored breathing.  Heart is regular rate and rhythm without murmur gallop or rub-he does not have any significant lower extremity or sacral edema.  Abdomen soft nontender positive bowel sounds.  Musculoskeletal moves all extremities x4 does ambulate about the facility-I look at his left foot I did not note any deformity is able to wiggle his toes without significant discomfort-he did have a wound here that appears healed.  Neurologic is grossly intact his speech is clear no lateralizing findings.  Psych he is alert and oriented x3 pleasant and appropriate.  Labs.  06/16/2014.  WBC 7.3 hemoglobin 12.2 platelets 314.  Sodium 136 potassium 6 BUN 97 creatinine 2.01.  Bilirubin 0.2 alkaline phosphatase 176 AST 51 ALT 71.  Assessment and plan.  #1 hyperkalemia-a suspected to renal insufficiency-creatinine is somewhat up from his baseline although I do note he did have a creatinine of 2.03 on July 13-Will give  Kayexalate 30 g x1 dose and recheck a BMP stat tomorrow for follow up of this.  Continue to hold Lasix also will hold lisinopril until further notice.  His vital signs blood pressure will have to be monitored every shift he continues on hydralazine 50 mg 3 times a day as well as clonidine 0.1 mg 3 times  a day-appears he has somewhat variable blood pressures per review of chart.  #2 elevated liver function tests this has been chronic appears to be relatively baseline-apparently there has been a history of acalculous cholecystitis at some point which could contribute to this--in fact I did review the note 01/13/2014 from GI which states chronic cholecystitis recommendation for possible cholecystectomy when patient is more stable  #-CHF--at this point appears stable despite being off Lasix--  [id number]  .

## 2014-06-20 LAB — GLUCOSE, CAPILLARY: Glucose-Capillary: 108 mg/dL — ABNORMAL HIGH (ref 70–99)

## 2014-06-21 LAB — GLUCOSE, CAPILLARY
GLUCOSE-CAPILLARY: 88 mg/dL (ref 70–99)
Glucose-Capillary: 87 mg/dL (ref 70–99)

## 2014-06-22 ENCOUNTER — Non-Acute Institutional Stay (SKILLED_NURSING_FACILITY): Payer: Self-pay | Admitting: Internal Medicine

## 2014-06-22 DIAGNOSIS — I5032 Chronic diastolic (congestive) heart failure: Secondary | ICD-10-CM

## 2014-06-22 DIAGNOSIS — E1129 Type 2 diabetes mellitus with other diabetic kidney complication: Secondary | ICD-10-CM

## 2014-06-22 DIAGNOSIS — E1165 Type 2 diabetes mellitus with hyperglycemia: Principal | ICD-10-CM

## 2014-06-22 LAB — GLUCOSE, CAPILLARY: Glucose-Capillary: 106 mg/dL — ABNORMAL HIGH (ref 70–99)

## 2014-06-23 LAB — GLUCOSE, CAPILLARY: Glucose-Capillary: 173 mg/dL — ABNORMAL HIGH (ref 70–99)

## 2014-06-24 LAB — GLUCOSE, CAPILLARY
Glucose-Capillary: 100 mg/dL — ABNORMAL HIGH (ref 70–99)
Glucose-Capillary: 151 mg/dL — ABNORMAL HIGH (ref 70–99)

## 2014-06-24 NOTE — Progress Notes (Addendum)
Patient ID: Joshua [name], male   DOB: 11-21-1951, 63 y.o.   MRN: [id number]               PROGRESS NOTE  DATE:  06/22/2014    FACILITY: [facility]    LEVEL OF CARE:   SNF   Acute Visit/Discharge Visit      HISTORY OF PRESENT ILLNESS:   This is a patient who came to [place] earlier this year after having undergone surgery on his left foot for a diabetic foot infection.  He did well from this.    He developed a DVT in his popliteal vein in early February.  He was admitted to hospital acutely ill and actually had a cardiac arrest in Radiology.  He underwent 14 minutes of ACLS.  He underwent a left heart catheterization.   His echocardiogram showed an EF of 55-60%.  From a cardiac point of view, he has done very well.  His diastolic heart failure has largely been controlled on no current diuretic.    With regards to his diabetes, he is on Amaryl 1 mg daily.  His CBGs are very well controlled, generally in the low 100 ranges.    With regards to his hypertension, this is actually episodically quite low, yesterday morning at 91/56; however, as high later in the day at 155/83.  He is currently on clonidine 0.1 mg three times a day, hydralazine 50 mg three times a day.     LABORATORY DATA:  Lab work from today shows a sodium of 138, potassium of 5.4, BUN of 77, creatinine of 1.66.  This is actually improved from 97 and 2.01, respectively, and a potassium of 6 off his lisinopril.  It is clear that he is not a candidate for even small-dose ACE inhibitors.    REVIEW OF SYSTEMS:  The patient has done well.  His only complaint today is a cracking sensation in his bilateral feet.  I see no major issues here, especially on the left foot which was his surgical foot.  His peripheral pulses are palpable.  There is no acute arthritis.   PHYSICAL EXAMINATION:   CHEST/RESPIRATORY:  Clear air entry bilaterally.     CARDIOVASCULAR:  CARDIAC:   Heart sounds are normal.  He appears to be euvolemic.     ASSESSMENT/PLAN:  Type 2 diabetes.  On only a small dose of Amaryl.  This appears to be doing very well.  He has diabetic nephropathy, and retinopathy for which he is currently being followed by Ophthalmology.  He has an intolerance to even small doses of ACE inhibitors.  I have stopped this.    Popliteal vein DVT in February.  He can come off Coumadin.  I see no other cardiac issues here that would necessitate ongoing anticoagulation.  We will start him on aspirin 81 mg.    I think this man is getting ready to go home.  He is in a difficult insurance issue.  Apparently does not have or lost his [employer] and, therefore, cannot make Medicaid application.  He has family support, however.  We have arranged for as much supportive appointments, cards that can be arranged through [facility].  I do not see a medical reason why he cannot go home.

## 2014-06-26 LAB — GLUCOSE, CAPILLARY: Glucose-Capillary: 87 mg/dL (ref 70–99)

## 2014-06-27 LAB — GLUCOSE, CAPILLARY: GLUCOSE-CAPILLARY: 104 mg/dL — AB (ref 70–99)

## 2014-06-28 ENCOUNTER — Encounter (INDEPENDENT_AMBULATORY_CARE_PROVIDER_SITE_OTHER): Payer: Self-pay | Admitting: Ophthalmology

## 2014-07-06 ENCOUNTER — Ambulatory Visit (INDEPENDENT_AMBULATORY_CARE_PROVIDER_SITE_OTHER): Payer: Self-pay | Admitting: Internal Medicine

## 2014-07-06 ENCOUNTER — Ambulatory Visit (HOSPITAL_COMMUNITY)
Admission: RE | Admit: 2014-07-06 | Discharge: 2014-07-06 | Disposition: A | Discharge status: Home / Self Care | Payer: Self-pay | Source: Ambulatory Visit | Attending: Internal Medicine | Admitting: Internal Medicine

## 2014-07-06 ENCOUNTER — Telehealth: Payer: Self-pay | Admitting: Internal Medicine

## 2014-07-06 ENCOUNTER — Encounter: Payer: Self-pay | Admitting: Internal Medicine

## 2014-07-06 VITALS — BP 107/58 | HR 76 | Temp 97.1°F | Ht 65.0 in | Wt 147.5 lb

## 2014-07-06 DIAGNOSIS — L97409 Non-pressure chronic ulcer of unspecified heel and midfoot with unspecified severity: Secondary | ICD-10-CM

## 2014-07-06 DIAGNOSIS — I10 Essential (primary) hypertension: Secondary | ICD-10-CM

## 2014-07-06 DIAGNOSIS — M7989 Other specified soft tissue disorders: Secondary | ICD-10-CM

## 2014-07-06 DIAGNOSIS — I872 Venous insufficiency (chronic) (peripheral): Secondary | ICD-10-CM

## 2014-07-06 DIAGNOSIS — E1122 Type 2 diabetes mellitus with diabetic chronic kidney disease: Secondary | ICD-10-CM

## 2014-07-06 DIAGNOSIS — L97521 Non-pressure chronic ulcer of other part of left foot limited to breakdown of skin: Secondary | ICD-10-CM

## 2014-07-06 DIAGNOSIS — N189 Chronic kidney disease, unspecified: Secondary | ICD-10-CM

## 2014-07-06 DIAGNOSIS — Z86718 Personal history of other venous thrombosis and embolism: Secondary | ICD-10-CM

## 2014-07-06 DIAGNOSIS — E1129 Type 2 diabetes mellitus with other diabetic kidney complication: Secondary | ICD-10-CM

## 2014-07-06 DIAGNOSIS — I129 Hypertensive chronic kidney disease with stage 1 through stage 4 chronic kidney disease, or unspecified chronic kidney disease: Secondary | ICD-10-CM

## 2014-07-06 DIAGNOSIS — E1169 Type 2 diabetes mellitus with other specified complication: Secondary | ICD-10-CM

## 2014-07-06 LAB — COMPLETE METABOLIC PANEL WITH GFR
ALK PHOS: 169 U/L — AB (ref 39–117)
ALT: 42 U/L (ref 0–53)
AST: 31 U/L (ref 0–37)
Albumin: 3.2 g/dL — ABNORMAL LOW (ref 3.5–5.2)
BILIRUBIN TOTAL: 0.6 mg/dL (ref 0.2–1.2)
BUN: 39 mg/dL — ABNORMAL HIGH (ref 6–23)
CO2: 22 mEq/L (ref 19–32)
CREATININE: 1.35 mg/dL (ref 0.50–1.35)
Calcium: 9.3 mg/dL (ref 8.4–10.5)
Chloride: 104 mEq/L (ref 96–112)
GFR, EST NON AFRICAN AMERICAN: 56 mL/min — AB
GFR, Est African American: 65 mL/min
GLUCOSE: 119 mg/dL — AB (ref 70–99)
Potassium: 4.5 mEq/L (ref 3.5–5.3)
SODIUM: 135 meq/L (ref 135–145)
Total Protein: 6.7 g/dL (ref 6.0–8.3)

## 2014-07-06 LAB — POCT GLYCOSYLATED HEMOGLOBIN (HGB A1C): Hemoglobin A1C: 6.8

## 2014-07-06 LAB — GLUCOSE, CAPILLARY: Glucose-Capillary: 152 mg/dL — ABNORMAL HIGH (ref 70–99)

## 2014-07-06 NOTE — Progress Notes (Signed)
*  PRELIMINARY RESULTS* Vascular Ultrasound Right lower extremity venous duplex has been completed.  Preliminary findings: No evidence of DVT in visualized veins.  Attempted call report to Dr. Redmond Pulling at [phone]. Left voice message with results.   Landry Mellow, RDMS, RVT  07/06/2014, 5:00 PM

## 2014-07-06 NOTE — Patient Instructions (Addendum)
1. I am checking an ultrasound of your leg to make sure you do not have a new clot.  I will call you with the results.  I am checking blood work.  If your kidney function is stable I will send Lasix and lisinopril to your pharmacy.  Someone will call you to arrange wound care visits in your home.  Please come back to see me in 3-4 weeks or sooner if you have new or worsening symptoms.    2. Please take all medications as prescribed.    3. If you have worsening of your symptoms or new symptoms arise, please call the clinic [phone]), or go to the ER immediately if symptoms are severe.

## 2014-07-06 NOTE — Progress Notes (Signed)
   Subjective:    Patient ID: Joshua [name], male    DOB: 1951-07-14, 63 y.o.   MRN: [id number]  HPI Comments: [name] is a 63 year old male with a PMH of HTN, DM type 2 (Hgb A1c 6.27 February 2014 ), chronic venous stasis, nephrotic syndrome, dHF (EF 55-60% March 2015), Fe-deficiency anemia, cdiff infection and chronic diabetic foot ulcer.  He was hospitalized 6 months ago for the right foot ulcer given concern for osteo.  He was treated with abx and I&D.  There was no MRI evidence of osteo and dopplers indicated sufficient flow for good healing.  He was to follow-up with ortho after discharge.  The initial hospitalization was complicated by septic shock thought to be 2/2 acalculous cholecystitis, B/L lower extremity DVTs, seizure and he required short ICU stay.  He was discharged to SNF and began to have abdominal pain and diarrhea.  He returned for outpatient KUB and had cardiac arrest with ROSC after 14 minutes and was subsequently hospitalized a second time.  He was discharged to [facility] and has was staying there up until 1 week ago. He is now back in his home and has a family member who stays with him for the majority of the day to assist him.  Both Mr. Patricelli and his family member note that he his right leg has been more swollen and painful since he left [facility] 1 week ago.  He was on coumadin for following DVTs but he says the medication was stopped and he was told to take daily ASA.     Review of Systems  Constitutional: Negative for fever, chills and appetite change.  Respiratory: Negative for shortness of breath.   Cardiovascular: Positive for leg swelling. Negative for chest pain and palpitations.  Gastrointestinal: Negative for nausea, vomiting, abdominal pain, diarrhea, constipation and blood in stool.  Genitourinary: Negative for dysuria, frequency and hematuria.  Neurological: Negative for syncope, weakness and light-headedness.  Psychiatric/Behavioral: Negative for suicidal  ideas.       Objective:   Physical Exam  Vitals reviewed. Constitutional: He is oriented to person, place, and time. He appears well-developed. No distress.  HENT:  Mouth/Throat: Oropharynx is clear and moist. No oropharyngeal exudate.  Eyes: Pupils are equal, round, and reactive to light.  Cardiovascular: Normal rate, regular rhythm and normal heart sounds.  Exam reveals no gallop and no friction rub.   No murmur heard. Pulmonary/Chest: Effort normal and breath sounds normal. No respiratory distress. He has no wheezes. He has no rales.  Abdominal: Soft. Bowel sounds are normal. He exhibits no distension. There is no tenderness.  Musculoskeletal: Normal range of motion. He exhibits edema and tenderness.  Compression stockings removed - B/L lower extremity edema  R>L; blisters on right leg, 1 is leaking. Legs and feet swollen.  Still has chronic wound on plantar aspect of right foot (looks similar to prior exam several months ago)  Neurological: He is alert and oriented to person, place, and time. No cranial nerve deficit.  Skin: Skin is warm. He is not diaphoretic.  Psychiatric: He has a normal mood and affect. His behavior is normal.          Assessment & Plan:  Please see problem based assessment and plan.

## 2014-07-06 NOTE — Telephone Encounter (Signed)
I called to inform Joshua [name] of preliminary duplex result.  He voiced understanding.

## 2014-07-07 ENCOUNTER — Encounter (INDEPENDENT_AMBULATORY_CARE_PROVIDER_SITE_OTHER): Payer: Self-pay | Admitting: Gastroenterology

## 2014-07-07 ENCOUNTER — Encounter: Payer: Self-pay | Admitting: Gastroenterology

## 2014-07-07 ENCOUNTER — Telehealth: Payer: Self-pay | Admitting: Gastroenterology

## 2014-07-07 MED ORDER — GLIMEPIRIDE 1 MG PO TABS: 1.0000 mg | ORAL_TABLET | Freq: Every day | ORAL | Status: DC

## 2014-07-07 MED ORDER — ISOSORBIDE MONONITRATE ER 30 MG PO TB24: 30.0000 mg | ORAL_TABLET | Freq: Every day | ORAL | Status: DC

## 2014-07-07 MED ORDER — HYDRALAZINE HCL 50 MG PO TABS: 50.0000 mg | ORAL_TABLET | Freq: Three times a day (TID) | ORAL | Status: DC

## 2014-07-07 MED ORDER — FERROUS SULFATE 325 (65 FE) MG PO TABS: 325.0000 mg | ORAL_TABLET | Freq: Three times a day (TID) | ORAL | Status: DC

## 2014-07-07 MED ORDER — FUROSEMIDE 20 MG PO TABS
20.0000 mg | ORAL_TABLET | Freq: Every day | ORAL | Status: DC
Start: 2014-07-07 — End: 2015-06-14

## 2014-07-07 NOTE — Progress Notes (Signed)
   Subjective:    Patient ID: Joshua [name], male    DOB: [birth year], 63 y.o.   MRN: [id number]  HPI   Past Medical History  Diagnosis Date  . 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 Hypertension 10/14/2012  . Type 2 diabetes mellitus   . Diabetic nephropathy with proteinuria   . 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 Diastolic heart failure   . Cellulitis   . Convulsions   . Iron deficiency anemia   . 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 Anasarca   . Hypoalbuminemia   . Elevated alkaline phosphatase level   . Microcytic anemia   . GI bleed   . Nephrotic syndrome   . Difficulty in walking(719.7)   . Muscle weakness (generalized)   . Type II or unspecified type diabetes mellitus with renal manifestations, not stated as uncontrolled   . Iron deficiency anemia, unspecified     Review of Systems     Objective:   Physical Exam        Assessment & Plan:

## 2014-07-07 NOTE — Telephone Encounter (Signed)
REVIEWED.  

## 2014-07-07 NOTE — Telephone Encounter (Signed)
Mailed letter °

## 2014-07-07 NOTE — Assessment & Plan Note (Signed)
Patient with first DVT 6 months ago requiring at least 3 months anticoagulation.  Since he received over 3 months of therapy Coumadin was d/c by SNF physician two weeks ago prior to his SNF discharge.  The patient has increased right leg pain and swelling but this could be due to known chronic venous stasis.  Because the pain is unilateral and given hx of nephrotic syndrome and prior DVT now off coumadin there is concern for DVT.  - STAT lower extremity duplex negative for DVT (preliminary result) - treating venous stasis as outlined in A&P

## 2014-07-07 NOTE — Assessment & Plan Note (Signed)
B/L edema with blistering of right leg.  Pain and swelling in right leg.  STAT duplex negative for DVT.  He is wearing compression stockings.  Lasix was d/c six weeks ago while at SNF due to elevated Cr.  His Cr has improved today 1.35. - resume Lasix 20mg  daily; check BMP early next week - he needs to elevate his legs when seated - he will stop wearing compression stocking on right leg until blisters have healed to avoid disrupting the skin; ultimately compression stockings will be helpful  - arrange home wound care

## 2014-07-07 NOTE — Assessment & Plan Note (Signed)
Chronic diabetic ulcer on plantar surface of left foot near the first toe.  He was hospitalized for this wound six months ago and received abx and I&D.  There was no MRI evidence of osteo.  ABI showed adequate blood flow for wound healing.  He was to follow-up with Ortho outpatient, however he had subsequent cardiac arrest, hospitalization and SNF stay and has not followed-up.  He says he received wound care during SNF stay.  The area looks unchanged to me from six months ago.   - referral for home wound care - managing chronic conditions including DM and venous insufficiency per A&P - will have him follow-up in 2-4 weeks

## 2014-07-07 NOTE — Assessment & Plan Note (Addendum)
BP Readings from Last 3 Encounters:  07/06/14 107/58  06/15/14 114/57  05/27/14 138/80    Lab Results  Component Value Date   NA 135 07/06/2014   K 4.5 07/06/2014   CREATININE 1.35 07/06/2014    Assessment: Blood pressure control:  well controlled Progress toward BP goal:   at goal Comments: His cousin is with him at the visit and has been helping with medication administration.  He has brought the medication blister packs.  He shows me what he has been giving to Mr. Rubey.  He was confused by the different numbers on the packs (he thought they were different meds) so he has been giving twice as much hydralazine and clonidine since Mr. Halbur has been home from the SNF (1 week).  Per nursing home notes, Lasix and lisinopril were held in the setting of AKI on CKD.  Lasix has been on hold for about 6 weeks.  Lisinopril was stopped and then resumed, then stopped again about two weeks ago for AKI on CKD.  CMP this visit reveals Cr 1.35 (improved from 1.66 on 07/29 prior to SNF d/c) and close to his baseline.  Plan: Medications:  continue current medications:  Hydralazine 50mg  TID, clonidine 0.1mg  TID, RESUME Lasix 20mg  daily (last dose he was taking at SNF) to help with leg swelling.  I put a rubber band around the blister packets that contained identical medications and I showed his cousin where to find the drug name on the package.  He voiced understanding. Educational resources provided: brochure Other plans: Although his Cr is close to baseline, I will hold off on adding back lisinopril as he seemed to suffer significant bumps in his Cr during SNF stay when Lasix and lisinopril were given.  Since I am adding back one medication with nephrotoxic potential I will hold off on adding back lisinopril.  I will have him return to clinic for BMP next week.

## 2014-07-07 NOTE — Assessment & Plan Note (Addendum)
Lab Results  Component Value Date   HGBA1C 6.8 07/06/2014   HGBA1C 6.5 02/24/2014   HGBA1C 6.5 11/26/2013     Assessment: Diabetes control:  well controlled Progress toward A1C goal:   at goal Comments:  He is not checking his CBGs (seems unclear on how to use his meter).  Per nursing home notes CBGs were well controlled on glimepiride 1 mg daily.  He denies hypoglycemic symptoms.  Plan: Medications:  continue current medications:  Glimepiride 1mg  daily Home glucose monitoring: Frequency:  2x daily Timing:   Instruction/counseling given: reminded to bring blood glucose meter & log to each visit Educational resources provided: brochure Self management tools provided:   Other plans: Will have him bring his meter to clinic so we can teach him to use the meter (I will talk to Butch Penny about this). 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  He should be on ACEI given significant proteinuria however he was unable to tolerate 10mg  lisinopril during SNF stay (suffered from AKI on CKD).  Cr is stable but I am resuming Lasix to help with lower extremity edema.  If his Cr tolerates low dose Lasix (I am checking another BMP next week) I can try adding back lisinopril, perhaps at low 2.5mg  or 5mg  dose.  Otherwise, I will have him return for office visit in 1 month.

## 2014-07-07 NOTE — Telephone Encounter (Signed)
Pt was a no show

## 2014-07-08 ENCOUNTER — Ambulatory Visit: Payer: Self-pay

## 2014-07-10 ENCOUNTER — Inpatient Hospital Stay (HOSPITAL_COMMUNITY): Payer: Self-pay

## 2014-07-10 ENCOUNTER — Emergency Department (HOSPITAL_COMMUNITY): Payer: Self-pay

## 2014-07-10 ENCOUNTER — Encounter (HOSPITAL_COMMUNITY): Payer: Self-pay | Admitting: Emergency Medicine

## 2014-07-10 ENCOUNTER — Inpatient Hospital Stay (HOSPITAL_COMMUNITY)
Admission: EM | Admit: 2014-07-10 | Discharge: 2014-07-19 | DRG: 853 | Disposition: A | Discharge status: Skilled Nursing Facility | Payer: Self-pay | Attending: Internal Medicine | Admitting: Internal Medicine

## 2014-07-10 DIAGNOSIS — N049 Nephrotic syndrome with unspecified morphologic changes: Secondary | ICD-10-CM | POA: Diagnosis present

## 2014-07-10 DIAGNOSIS — M9080 Osteopathy in diseases classified elsewhere, unspecified site: Secondary | ICD-10-CM | POA: Diagnosis present

## 2014-07-10 DIAGNOSIS — J9811 Atelectasis: Secondary | ICD-10-CM | POA: Diagnosis not present

## 2014-07-10 DIAGNOSIS — I129 Hypertensive chronic kidney disease with stage 1 through stage 4 chronic kidney disease, or unspecified chronic kidney disease: Secondary | ICD-10-CM | POA: Diagnosis present

## 2014-07-10 DIAGNOSIS — L03119 Cellulitis of unspecified part of limb: Secondary | ICD-10-CM

## 2014-07-10 DIAGNOSIS — L97509 Non-pressure chronic ulcer of other part of unspecified foot with unspecified severity: Secondary | ICD-10-CM | POA: Diagnosis present

## 2014-07-10 DIAGNOSIS — A419 Sepsis, unspecified organism: Principal | ICD-10-CM | POA: Diagnosis present

## 2014-07-10 DIAGNOSIS — E1122 Type 2 diabetes mellitus with diabetic chronic kidney disease: Secondary | ICD-10-CM

## 2014-07-10 DIAGNOSIS — J9819 Other pulmonary collapse: Secondary | ICD-10-CM | POA: Diagnosis not present

## 2014-07-10 DIAGNOSIS — E8729 Other acidosis: Secondary | ICD-10-CM | POA: Diagnosis present

## 2014-07-10 DIAGNOSIS — K810 Acute cholecystitis: Secondary | ICD-10-CM

## 2014-07-10 DIAGNOSIS — L089 Local infection of the skin and subcutaneous tissue, unspecified: Secondary | ICD-10-CM

## 2014-07-10 DIAGNOSIS — D649 Anemia, unspecified: Secondary | ICD-10-CM | POA: Diagnosis present

## 2014-07-10 DIAGNOSIS — R0902 Hypoxemia: Secondary | ICD-10-CM

## 2014-07-10 DIAGNOSIS — L02619 Cutaneous abscess of unspecified foot: Secondary | ICD-10-CM | POA: Diagnosis present

## 2014-07-10 DIAGNOSIS — E1169 Type 2 diabetes mellitus with other specified complication: Secondary | ICD-10-CM | POA: Diagnosis present

## 2014-07-10 DIAGNOSIS — Z8674 Personal history of sudden cardiac arrest: Secondary | ICD-10-CM

## 2014-07-10 DIAGNOSIS — I5032 Chronic diastolic (congestive) heart failure: Secondary | ICD-10-CM | POA: Diagnosis present

## 2014-07-10 DIAGNOSIS — I10 Essential (primary) hypertension: Secondary | ICD-10-CM | POA: Diagnosis present

## 2014-07-10 DIAGNOSIS — E872 Acidosis, unspecified: Secondary | ICD-10-CM | POA: Diagnosis present

## 2014-07-10 DIAGNOSIS — Z7982 Long term (current) use of aspirin: Secondary | ICD-10-CM

## 2014-07-10 DIAGNOSIS — E1129 Type 2 diabetes mellitus with other diabetic kidney complication: Secondary | ICD-10-CM | POA: Diagnosis present

## 2014-07-10 DIAGNOSIS — E1121 Type 2 diabetes mellitus with diabetic nephropathy: Secondary | ICD-10-CM

## 2014-07-10 DIAGNOSIS — N183 Chronic kidney disease, stage 3 unspecified: Secondary | ICD-10-CM | POA: Diagnosis present

## 2014-07-10 DIAGNOSIS — L02419 Cutaneous abscess of limb, unspecified: Secondary | ICD-10-CM | POA: Diagnosis present

## 2014-07-10 DIAGNOSIS — Z86718 Personal history of other venous thrombosis and embolism: Secondary | ICD-10-CM

## 2014-07-10 DIAGNOSIS — R6520 Severe sepsis without septic shock: Secondary | ICD-10-CM

## 2014-07-10 DIAGNOSIS — Z87891 Personal history of nicotine dependence: Secondary | ICD-10-CM

## 2014-07-10 DIAGNOSIS — R269 Unspecified abnormalities of gait and mobility: Secondary | ICD-10-CM

## 2014-07-10 DIAGNOSIS — G9341 Metabolic encephalopathy: Secondary | ICD-10-CM | POA: Diagnosis present

## 2014-07-10 DIAGNOSIS — R6521 Severe sepsis with septic shock: Secondary | ICD-10-CM

## 2014-07-10 DIAGNOSIS — R531 Weakness: Secondary | ICD-10-CM

## 2014-07-10 DIAGNOSIS — E874 Mixed disorder of acid-base balance: Secondary | ICD-10-CM | POA: Diagnosis present

## 2014-07-10 DIAGNOSIS — E8809 Other disorders of plasma-protein metabolism, not elsewhere classified: Secondary | ICD-10-CM | POA: Diagnosis present

## 2014-07-10 DIAGNOSIS — R1310 Dysphagia, unspecified: Secondary | ICD-10-CM | POA: Diagnosis present

## 2014-07-10 DIAGNOSIS — Z8614 Personal history of Methicillin resistant Staphylococcus aureus infection: Secondary | ICD-10-CM

## 2014-07-10 DIAGNOSIS — R0603 Acute respiratory distress: Secondary | ICD-10-CM

## 2014-07-10 DIAGNOSIS — E1142 Type 2 diabetes mellitus with diabetic polyneuropathy: Secondary | ICD-10-CM

## 2014-07-10 DIAGNOSIS — R748 Abnormal levels of other serum enzymes: Secondary | ICD-10-CM

## 2014-07-10 DIAGNOSIS — R601 Generalized edema: Secondary | ICD-10-CM

## 2014-07-10 DIAGNOSIS — N058 Unspecified nephritic syndrome with other morphologic changes: Secondary | ICD-10-CM | POA: Diagnosis present

## 2014-07-10 DIAGNOSIS — J9602 Acute respiratory failure with hypercapnia: Secondary | ICD-10-CM | POA: Diagnosis present

## 2014-07-10 DIAGNOSIS — I509 Heart failure, unspecified: Secondary | ICD-10-CM | POA: Diagnosis present

## 2014-07-10 DIAGNOSIS — F411 Generalized anxiety disorder: Secondary | ICD-10-CM

## 2014-07-10 DIAGNOSIS — M869 Osteomyelitis, unspecified: Secondary | ICD-10-CM | POA: Diagnosis present

## 2014-07-10 DIAGNOSIS — E11628 Type 2 diabetes mellitus with other skin complications: Secondary | ICD-10-CM

## 2014-07-10 DIAGNOSIS — J9600 Acute respiratory failure, unspecified whether with hypoxia or hypercapnia: Secondary | ICD-10-CM | POA: Diagnosis present

## 2014-07-10 LAB — URINE MICROSCOPIC-ADD ON

## 2014-07-10 LAB — COMPREHENSIVE METABOLIC PANEL
ALBUMIN: 2.8 g/dL — AB (ref 3.5–5.2)
ALBUMIN: 2.2 g/dL — AB (ref 3.5–5.2)
ALT: 45 U/L (ref 0–53)
ALT: 33 U/L (ref 0–53)
ANION GAP: 16 — AB (ref 5–15)
AST: 44 U/L — AB (ref 0–37)
AST: 34 U/L (ref 0–37)
Alkaline Phosphatase: 242 U/L — ABNORMAL HIGH (ref 39–117)
Alkaline Phosphatase: 237 U/L — ABNORMAL HIGH (ref 39–117)
Anion gap: 20 — ABNORMAL HIGH (ref 5–15)
BILIRUBIN TOTAL: 0.7 mg/dL (ref 0.3–1.2)
BUN: 42 mg/dL — ABNORMAL HIGH (ref 6–23)
BUN: 37 mg/dL — ABNORMAL HIGH (ref 6–23)
CALCIUM: 9.8 mg/dL (ref 8.4–10.5)
CALCIUM: 8.5 mg/dL (ref 8.4–10.5)
CO2: 19 meq/L (ref 19–32)
CO2: 19 mEq/L (ref 19–32)
CREATININE: 1.38 mg/dL — AB (ref 0.50–1.35)
Chloride: 101 mEq/L (ref 96–112)
Chloride: 106 mEq/L (ref 96–112)
Creatinine, Ser: 1.44 mg/dL — ABNORMAL HIGH (ref 0.50–1.35)
GFR calc Af Amer: 62 mL/min — ABNORMAL LOW (ref >90)
GFR calc Af Amer: 59 mL/min — ABNORMAL LOW (ref >90)
GFR calc non Af Amer: 53 mL/min — ABNORMAL LOW (ref >90)
GFR, EST NON AFRICAN AMERICAN: 51 mL/min — AB (ref >90)
Glucose, Bld: 72 mg/dL (ref 70–99)
Glucose, Bld: 119 mg/dL — ABNORMAL HIGH (ref 70–99)
Potassium: 4.4 mEq/L (ref 3.7–5.3)
Potassium: 4.3 mEq/L (ref 3.7–5.3)
Sodium: 140 mEq/L (ref 137–147)
Sodium: 141 mEq/L (ref 137–147)
TOTAL PROTEIN: 8.9 g/dL — AB (ref 6.0–8.3)
TOTAL PROTEIN: 7.1 g/dL (ref 6.0–8.3)
Total Bilirubin: 0.7 mg/dL (ref 0.3–1.2)

## 2014-07-10 LAB — URINALYSIS, ROUTINE W REFLEX MICROSCOPIC
BILIRUBIN URINE: NEGATIVE
BILIRUBIN URINE: NEGATIVE
GLUCOSE, UA: 100 mg/dL — AB
Glucose, UA: 100 mg/dL — AB
KETONES UR: NEGATIVE mg/dL
KETONES UR: NEGATIVE mg/dL
Leukocytes, UA: NEGATIVE
Leukocytes, UA: NEGATIVE
Nitrite: NEGATIVE
Nitrite: NEGATIVE
PH: 5 (ref 5.0–8.0)
Protein, ur: >300 mg/dL — AB
Specific Gravity, Urine: 1.025 (ref 1.005–1.030)
Specific Gravity, Urine: 1.024 (ref 1.005–1.030)
UROBILINOGEN UA: 0.2 mg/dL (ref 0.0–1.0)
Urobilinogen, UA: 0.2 mg/dL (ref 0.0–1.0)
pH: 5 (ref 5.0–8.0)

## 2014-07-10 LAB — I-STAT VENOUS BLOOD GAS, ED
ACID-BASE DEFICIT: 9 mmol/L — AB (ref 0.0–2.0)
ACID-BASE DEFICIT: 4 mmol/L — AB (ref 0.0–2.0)
BICARBONATE: 23.2 meq/L (ref 20.0–24.0)
Bicarbonate: 15.6 mEq/L — ABNORMAL LOW (ref 20.0–24.0)
O2 SAT: 84 %
O2 SAT: 77 %
PO2 VEN: 55 mmHg — AB (ref 30.0–45.0)
PO2 VEN: 53 mmHg — AB (ref 30.0–45.0)
Patient temperature: 101.8
TCO2: 16 mmol/L (ref 0–100)
TCO2: 25 mmol/L (ref 0–100)
pCO2, Ven: 29.2 mmHg — ABNORMAL LOW (ref 45.0–50.0)
pCO2, Ven: 55.9 mmHg — ABNORMAL HIGH (ref 45.0–50.0)
pH, Ven: 7.344 — ABNORMAL HIGH (ref 7.250–7.300)
pH, Ven: 7.231 — ABNORMAL LOW (ref 7.250–7.300)

## 2014-07-10 LAB — CBC WITH DIFFERENTIAL/PLATELET
BASOS ABS: 0 10*3/uL (ref 0.0–0.1)
BASOS PCT: 0 % (ref 0–1)
EOS ABS: 0 10*3/uL (ref 0.0–0.7)
EOS PCT: 0 % (ref 0–5)
HEMATOCRIT: 31.2 % — AB (ref 39.0–52.0)
Hemoglobin: 10.2 g/dL — ABNORMAL LOW (ref 13.0–17.0)
Lymphocytes Relative: 5 % — ABNORMAL LOW (ref 12–46)
Lymphs Abs: 1 10*3/uL (ref 0.7–4.0)
MCH: 27.3 pg (ref 26.0–34.0)
MCHC: 32.7 g/dL (ref 30.0–36.0)
MCV: 83.6 fL (ref 78.0–100.0)
MONO ABS: 1.1 10*3/uL — AB (ref 0.1–1.0)
Monocytes Relative: 5 % (ref 3–12)
Neutro Abs: 19.3 10*3/uL — ABNORMAL HIGH (ref 1.7–7.7)
Neutrophils Relative %: 90 % — ABNORMAL HIGH (ref 43–77)
PLATELETS: 457 10*3/uL — AB (ref 150–400)
RBC: 3.73 MIL/uL — ABNORMAL LOW (ref 4.22–5.81)
RDW: 16 % — AB (ref 11.5–15.5)
WBC: 21.4 10*3/uL — ABNORMAL HIGH (ref 4.0–10.5)

## 2014-07-10 LAB — I-STAT ARTERIAL BLOOD GAS, ED
Acid-base deficit: 5 mmol/L — ABNORMAL HIGH (ref 0.0–2.0)
Bicarbonate: 21.2 mEq/L (ref 20.0–24.0)
O2 SAT: 90 %
PCO2 ART: 43.2 mmHg (ref 35.0–45.0)
PH ART: 7.304 — AB (ref 7.350–7.450)
Patient temperature: 100.6
TCO2: 22 mmol/L (ref 0–100)
pO2, Arterial: 67 mmHg — ABNORMAL LOW (ref 80.0–100.0)

## 2014-07-10 LAB — CBC
HCT: 31.7 % — ABNORMAL LOW (ref 39.0–52.0)
Hemoglobin: 10.1 g/dL — ABNORMAL LOW (ref 13.0–17.0)
MCH: 26.9 pg (ref 26.0–34.0)
MCHC: 31.9 g/dL (ref 30.0–36.0)
MCV: 84.3 fL (ref 78.0–100.0)
PLATELETS: 321 10*3/uL (ref 150–400)
RBC: 3.76 MIL/uL — ABNORMAL LOW (ref 4.22–5.81)
RDW: 15.9 % — AB (ref 11.5–15.5)
WBC: 17.3 10*3/uL — ABNORMAL HIGH (ref 4.0–10.5)

## 2014-07-10 LAB — CBG MONITORING, ED
GLUCOSE-CAPILLARY: 65 mg/dL — AB (ref 70–99)
GLUCOSE-CAPILLARY: 118 mg/dL — AB (ref 70–99)
Glucose-Capillary: 83 mg/dL (ref 70–99)
Glucose-Capillary: 159 mg/dL — ABNORMAL HIGH (ref 70–99)
Glucose-Capillary: 137 mg/dL — ABNORMAL HIGH (ref 70–99)

## 2014-07-10 LAB — TYPE AND SCREEN
ABO/RH(D): O POS
ANTIBODY SCREEN: NEGATIVE

## 2014-07-10 LAB — GLUCOSE, CAPILLARY: GLUCOSE-CAPILLARY: 101 mg/dL — AB (ref 70–99)

## 2014-07-10 LAB — I-STAT CG4 LACTIC ACID, ED
LACTIC ACID, VENOUS: 1.61 mmol/L (ref 0.5–2.2)
LACTIC ACID, VENOUS: 1.19 mmol/L (ref 0.5–2.2)

## 2014-07-10 LAB — FIBRINOGEN: Fibrinogen: 652 mg/dL — ABNORMAL HIGH (ref 204–475)

## 2014-07-10 LAB — PROTIME-INR
INR: 1.29 (ref 0.00–1.49)
Prothrombin Time: 16.1 seconds — ABNORMAL HIGH (ref 11.6–15.2)

## 2014-07-10 LAB — TROPONIN I
Troponin I: 0.46 ng/mL (ref <0.30)
Troponin I: 0.49 ng/mL (ref <0.30)

## 2014-07-10 LAB — LACTIC ACID, PLASMA: Lactic Acid, Venous: 1.3 mmol/L (ref 0.5–2.2)

## 2014-07-10 LAB — APTT: aPTT: 30 seconds (ref 24–37)

## 2014-07-10 MED ORDER — VITAL HIGH PROTEIN PO LIQD
1000.0000 mL | ORAL | Status: DC
  Administered 2014-07-11: 1000 mL
  Filled 2014-07-10 (×3): qty 1000

## 2014-07-10 MED ORDER — ROCURONIUM BROMIDE 50 MG/5ML IV SOLN
INTRAVENOUS | Status: AC
  Filled 2014-07-10: qty 2

## 2014-07-10 MED ORDER — IOHEXOL 300 MG/ML  SOLN
100.0000 mL | Freq: Once | INTRAMUSCULAR | Status: AC | PRN
  Administered 2014-07-10: 100 mL via INTRAVENOUS

## 2014-07-10 MED ORDER — SUCCINYLCHOLINE CHLORIDE 20 MG/ML IJ SOLN
INTRAMUSCULAR | Status: AC | PRN
  Administered 2014-07-10: 100 mg via INTRAVENOUS

## 2014-07-10 MED ORDER — HALOPERIDOL LACTATE 5 MG/ML IJ SOLN: 1.0000 mg | Freq: Four times a day (QID) | INTRAMUSCULAR | Status: DC | PRN

## 2014-07-10 MED ORDER — LIDOCAINE HCL (CARDIAC) 20 MG/ML IV SOLN
INTRAVENOUS | Status: AC
  Filled 2014-07-10: qty 5

## 2014-07-10 MED ORDER — DEXTROSE 50 % IV SOLN
INTRAVENOUS | Status: AC
  Filled 2014-07-10: qty 50

## 2014-07-10 MED ORDER — ETOMIDATE 2 MG/ML IV SOLN
INTRAVENOUS | Status: AC
  Filled 2014-07-10: qty 20

## 2014-07-10 MED ORDER — PROPOFOL 10 MG/ML IV EMUL
5.0000 ug/kg/min | INTRAVENOUS | Status: DC
  Administered 2014-07-10: 40 ug/kg/min via INTRAVENOUS
  Administered 2014-07-11 (×3): 35 ug/kg/min via INTRAVENOUS
  Administered 2014-07-12: 40 ug/kg/min via INTRAVENOUS
  Filled 2014-07-10 (×6): qty 100

## 2014-07-10 MED ORDER — SODIUM CHLORIDE 0.9 % IV BOLUS (SEPSIS)
1000.0000 mL | Freq: Once | INTRAVENOUS | Status: AC
  Administered 2014-07-10: 1000 mL via INTRAVENOUS

## 2014-07-10 MED ORDER — INSULIN ASPART 100 UNIT/ML ~~LOC~~ SOLN
0.0000 [IU] | SUBCUTANEOUS | Status: DC
  Administered 2014-07-11 (×2): 1 [IU] via SUBCUTANEOUS
  Administered 2014-07-12: 2 [IU] via SUBCUTANEOUS
  Administered 2014-07-12 – 2014-07-13 (×3): 1 [IU] via SUBCUTANEOUS
  Administered 2014-07-13: 2 [IU] via SUBCUTANEOUS
  Administered 2014-07-14 (×2): 1 [IU] via SUBCUTANEOUS

## 2014-07-10 MED ORDER — CETYLPYRIDINIUM CHLORIDE 0.05 % MT LIQD
7.0000 mL | Freq: Four times a day (QID) | OROMUCOSAL | Status: DC
  Administered 2014-07-11 – 2014-07-12 (×6): 7 mL via OROMUCOSAL

## 2014-07-10 MED ORDER — SODIUM CHLORIDE 0.9 % IV BOLUS (SEPSIS)
500.0000 mL | Freq: Once | INTRAVENOUS | Status: AC
  Administered 2014-07-10: 500 mL via INTRAVENOUS

## 2014-07-10 MED ORDER — PIPERACILLIN-TAZOBACTAM 3.375 G IVPB
3.3750 g | Freq: Once | INTRAVENOUS | Status: AC
  Administered 2014-07-10: 3.375 g via INTRAVENOUS
  Filled 2014-07-10: qty 50

## 2014-07-10 MED ORDER — VANCOMYCIN HCL 500 MG IV SOLR
500.0000 mg | Freq: Two times a day (BID) | INTRAVENOUS | Status: DC
  Administered 2014-07-11 – 2014-07-17 (×13): 500 mg via INTRAVENOUS
  Filled 2014-07-10 (×14): qty 500

## 2014-07-10 MED ORDER — LORAZEPAM 2 MG/ML IJ SOLN
0.5000 mg | Freq: Once | INTRAMUSCULAR | Status: AC
  Administered 2014-07-10: 0.5 mg via INTRAVENOUS

## 2014-07-10 MED ORDER — ACETAMINOPHEN 650 MG RE SUPP
650.0000 mg | Freq: Once | RECTAL | Status: AC
  Administered 2014-07-10: 650 mg via RECTAL
  Filled 2014-07-10: qty 1

## 2014-07-10 MED ORDER — FAMOTIDINE 40 MG/5ML PO SUSR
20.0000 mg | Freq: Two times a day (BID) | ORAL | Status: DC
  Administered 2014-07-10 – 2014-07-12 (×5): 20 mg
  Filled 2014-07-10 (×8): qty 2.5

## 2014-07-10 MED ORDER — LORAZEPAM 2 MG/ML IJ SOLN
INTRAMUSCULAR | Status: AC
  Administered 2014-07-10: 0.5 mg via INTRAVENOUS
  Filled 2014-07-10: qty 1

## 2014-07-10 MED ORDER — ETOMIDATE 2 MG/ML IV SOLN
INTRAVENOUS | Status: AC | PRN
  Administered 2014-07-10: 30 mg via INTRAVENOUS

## 2014-07-10 MED ORDER — DOCUSATE SODIUM 50 MG/5ML PO LIQD
100.0000 mg | Freq: Two times a day (BID) | ORAL | Status: DC | PRN
  Filled 2014-07-10: qty 10

## 2014-07-10 MED ORDER — FENTANYL CITRATE 0.05 MG/ML IJ SOLN
100.0000 ug | INTRAMUSCULAR | Status: DC | PRN
  Administered 2014-07-11: 100 ug via INTRAVENOUS
  Filled 2014-07-10 (×2): qty 2

## 2014-07-10 MED ORDER — SODIUM CHLORIDE 0.9 % IV SOLN: 250.0000 mL | INTRAVENOUS | Status: DC | PRN

## 2014-07-10 MED ORDER — CHLORHEXIDINE GLUCONATE 0.12 % MT SOLN
15.0000 mL | Freq: Two times a day (BID) | OROMUCOSAL | Status: DC
  Administered 2014-07-11 – 2014-07-12 (×4): 15 mL via OROMUCOSAL
  Filled 2014-07-10 (×2): qty 15

## 2014-07-10 MED ORDER — DEXTROSE 50 % IV SOLN
1.0000 | Freq: Once | INTRAVENOUS | Status: AC
  Administered 2014-07-10: 50 mL via INTRAVENOUS

## 2014-07-10 MED ORDER — VANCOMYCIN HCL IN DEXTROSE 1-5 GM/200ML-% IV SOLN
1000.0000 mg | Freq: Once | INTRAVENOUS | Status: AC
  Administered 2014-07-10: 1000 mg via INTRAVENOUS
  Filled 2014-07-10: qty 200

## 2014-07-10 MED ORDER — ACETAMINOPHEN 325 MG PO TABS
650.0000 mg | ORAL_TABLET | Freq: Four times a day (QID) | ORAL | Status: DC | PRN
  Administered 2014-07-16: 650 mg via ORAL
  Filled 2014-07-10: qty 2

## 2014-07-10 MED ORDER — PROPOFOL 10 MG/ML IV EMUL
INTRAVENOUS | Status: AC
  Administered 2014-07-10: 10 ug/kg/min via INTRAVENOUS
  Filled 2014-07-10: qty 100

## 2014-07-10 MED ORDER — FENTANYL CITRATE 0.05 MG/ML IJ SOLN
100.0000 ug | INTRAMUSCULAR | Status: DC | PRN
  Administered 2014-07-10 – 2014-07-11 (×2): 100 ug via INTRAVENOUS
  Filled 2014-07-10: qty 2

## 2014-07-10 MED ORDER — SUCCINYLCHOLINE CHLORIDE 20 MG/ML IJ SOLN
INTRAMUSCULAR | Status: AC
  Filled 2014-07-10: qty 1

## 2014-07-10 MED ORDER — PIPERACILLIN-TAZOBACTAM 3.375 G IVPB
3.3750 g | Freq: Three times a day (TID) | INTRAVENOUS | Status: DC
  Administered 2014-07-11 – 2014-07-15 (×14): 3.375 g via INTRAVENOUS
  Filled 2014-07-10 (×15): qty 50

## 2014-07-10 MED ORDER — PROPOFOL 10 MG/ML IV EMUL
5.0000 ug/kg/min | Freq: Once | INTRAVENOUS | Status: AC
  Administered 2014-07-10: 10 ug/kg/min via INTRAVENOUS

## 2014-07-10 MED ORDER — HEPARIN SODIUM (PORCINE) 5000 UNIT/ML IJ SOLN
5000.0000 [IU] | Freq: Three times a day (TID) | INTRAMUSCULAR | Status: AC
  Administered 2014-07-10 – 2014-07-13 (×9): 5000 [IU] via SUBCUTANEOUS
  Filled 2014-07-10 (×10): qty 1

## 2014-07-10 NOTE — Progress Notes (Signed)
ANTIBIOTIC CONSULT NOTE - INITIAL  Pharmacy Consult for Vancomycin, Zosyn Indication: Sepsis   Allergies  Allergen Reactions  . Aspirin Itching    Patient Measurements:   Adjusted Body Weight: n/a   Vital Signs: Temp: 101.8 F (38.8 C) (08/16 1555) Temp src: Rectal (08/16 1555) BP: 189/104 mmHg (08/16 1555) Pulse Rate: 142 (08/16 1555) Intake/Output from previous day:   Intake/Output from this shift:    Labs: No results found for this basename: WBC, HGB, PLT, LABCREA, CREATININE,  in the last 72 hours The CrCl is unknown because both a height and weight (above a minimum accepted value) are required for this calculation. No results found for this basename: VANCOTROUGH, VANCOPEAK, VANCORANDOM, GENTTROUGH, GENTPEAK, GENTRANDOM, TOBRATROUGH, TOBRAPEAK, TOBRARND, AMIKACINPEAK, AMIKACINTROU, AMIKACIN,  in the last 72 hours   Microbiology: No results found for this or any previous visit (from the past 720 hour(s)).  Medical History: Past Medical History  Diagnosis Date  . 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 Hypertension 10/14/2012  . Type 2 diabetes mellitus   . Diabetic nephropathy with proteinuria   . 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 Diastolic heart failure   . Cellulitis   . Convulsions   . Iron deficiency anemia   . 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 Anasarca   . Hypoalbuminemia   . Elevated alkaline phosphatase level   . Microcytic anemia   . GI bleed   . Nephrotic syndrome   . Difficulty in walking(719.7)   . Muscle weakness (generalized)   . Type II or unspecified type diabetes mellitus with renal manifestations, not stated as uncontrolled   . Iron deficiency anemia, unspecified     Medications:   (Not in a hospital admission) Assessment: 50 YOM who brought to the ED after being found unconscious at home. He is noted to have urinary incontinence and diabetic ulcers to bilateral lower  legs. Pharmacy consulted to start Vancomycin and Zosyn for sepsis. Tm 101.8 mL/min. WBC elevated at 21.4. CrCl ~ 48 mL/min.   Goal of Therapy:  Vancomycin trough level 15-20 mcg/ml  Plan:  -Give Vancomycin 1 gm IV x 1 dose in ED followed by Vanc 500 mg IV Q 12 hours  -Give Zosyn 3.375 gm IV Q 8 hours  -Monitor CBC, renal fx, cultures and patient's clinical progress -VT at [facility], PharmD.  Clinical Pharmacist Pager [phone]

## 2014-07-10 NOTE — ED Notes (Signed)
Per PA, pt is appropriate to delay CT's at this time.

## 2014-07-10 NOTE — ED Notes (Signed)
Pt noted to have decrease in mental status. Pt diaphoretic. HR decreased to 123. Resp at bedside. PA at bedside. Attempted to remove pt off BiPAP and o2 decreased to 88%. Pt place on Bipap. Son at bedside.

## 2014-07-10 NOTE — ED Notes (Signed)
Successful intubation performed by Margarita Mail, PA. Good change. Equal, bilateral breath sounds. 100% on monitor.

## 2014-07-10 NOTE — Progress Notes (Signed)
ETT pulled back from 25 at lips to 24 at lips after XRAY per Luz Brazen, MD.   VT also changed from 8cc/kg (500)  to 6cc/kg (370) per Luz Brazen, MD

## 2014-07-10 NOTE — ED Notes (Signed)
Critical care at bedside discussing plan of care with family. Pt at this time has  GCS 7. Family in agreeance with plan to intubate.

## 2014-07-10 NOTE — ED Notes (Signed)
I Stat Lactic Acid results shown to Dr. Jerilynn Mages. Colin Rhein

## 2014-07-10 NOTE — ED Notes (Signed)
Per EMS: Pt from home, found unconscious by cousin this AM. CBG on arrival 30. Pt noted to have urinary incontinence. Pt given amp D 50, CBG 130. Pt noted to be confused. HR ST 140-150's. 220/124. Pt noted to have diabetic ulcers to bilateral lower legs. Pt denies taking insulin yesterday. Pt follows commands, diaphoretic, appears anxious.

## 2014-07-10 NOTE — ED Notes (Signed)
Per Critical Care MD, do CT of foot with contrast.

## 2014-07-10 NOTE — Sedation Documentation (Addendum)
Successful intubation performed by Margarita Mail, PA. Good change. Equal, bilateral breath sounds. 100% on monitor.

## 2014-07-10 NOTE — ED Notes (Signed)
Pt noted to have seizure activity with twitching to right eye, eye deviation upward to left. PA made aware; at bedside. Seizure activity lasted appx 30 sec.

## 2014-07-10 NOTE — Progress Notes (Signed)
CRITICAL VALUE ALERT  Critical value received: trop 0.46  Date of notification:  07/10/14  Time of notification:  2234  Critical value read back:yes  Nurse who received alert:  A.Ezequiel Ganser RN  MD notified (1st page):  J.Smith  Time of first page:  2234  MD notified (2nd page):  Time of second page:  Responding MD:  J.mith MD  Time MD responded:  2234

## 2014-07-10 NOTE — H&P (Signed)
PULMONARY / CRITICAL CARE MEDICINE HISTORY AND PHYSICAL EXAMINATION   Name: Joshua [name] MRN: [id number] DOB: 1951/07/13    ADMISSION DATE:  07/10/2014  PRIMARY SERVICE: PCCM  CHIEF COMPLAINT:  Altered Mental Status  BRIEF PATIENT DESCRIPTION: 63 y/o M w/ complex history of DM, c/b diabetic nephropathy with nephrotic proteinuria, chronic non-healing diabetic foot ulcer, hx of cardiac arrest in March '15 (unknown cause) who presents w/ fever, hypoglycemia, AKI, and gap metabolic acidosis.  SIGNIFICANT EVENTS / STUDIES:  Intubated in ED  LINES / TUBES: ETT, 7.5 mm -- 8/16 Foley -- 8/16 PIV x2 - 8/16 OGT -- 8/16  CULTURES: Blood x2 -- 8/16 (AFTER Abx dosed by ED) Sputum -- 8/16 Urine -- 8/16  ANTIBIOTICS: Vanc -- 8/16 Zosyn-- 8/16  HISTORY OF PRESENT ILLNESS:  [name] is a 63 y/o man with a complex past medical history most notable for a history of DM2 with complications of diabetic nephropathy with nephrotic range proteinuria, chronic non-healing foot ulcer, as well as cardiac arrest 01/30/14 with ROSC and return to normal neurologic status who presents with altered mental status. The patient had been in a SNF and was discharged home on the 3rd of this month and was doing well, per report of the patient's cousin, who lives with the patient, and who helps take care of him. He had been able to ambulate around the house with the assistance of a cane and a walker, and had been able to perform basic ADLs (bathe, clothe, use restroom) independently. On the day of admission, the patient was confused, and became more so, and ultimately was brought to the ED. There, he was found to be febrile, tachycardic, tachypneic, and had a leukocytosis. A chemistry panel showed a gap metabolic acidosis and concominant respiratory alkaosis. His mental status declined, and the ICU was consulted. The patient then progressed to a respiratory acidosis and he was intubated for further management.   PAST  MEDICAL HISTORY :  Past Medical History  Diagnosis Date  . 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 Hypertension 10/14/2012  . Type 2 diabetes mellitus   . Diabetic nephropathy with proteinuria   . 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 Diastolic heart failure   . Cellulitis   . Convulsions   . Iron deficiency anemia   . 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 Anasarca   . Hypoalbuminemia   . Elevated alkaline phosphatase level   . Microcytic anemia   . GI bleed   . Nephrotic syndrome   . Difficulty in walking(719.7)   . Muscle weakness (generalized)   . Type II or unspecified type diabetes mellitus with renal manifestations, not stated as uncontrolled   . Iron deficiency anemia, unspecified    Past Surgical History  Procedure Laterality Date  . Knee arthroscopy  2010    3 times, right knee  . I&d extremity Left 01/17/2014    Procedure: IRRIGATION AND DEBRIDEMENT  LEFT FOOT;  Surgeon: Wylene Simmer, MD;  Location: [place];  Service: Orthopedics;  Laterality: Left;   Prior to Admission medications   Medication Sig Start Date End Date Taking? Authorizing Provider  acetaminophen (TYLENOL) 325 MG tablet Take 325 mg by mouth every 4 (four) hours as needed for mild pain, moderate pain or fever.    Yes Historical Provider, MD  aspirin EC 81 MG tablet Take 81 mg by mouth daily.   Yes Historical Provider, MD  cloNIDine (CATAPRES)  0.1 MG tablet Take 1 tablet (0.1 mg total) by mouth 3 (three) times daily. 02/11/14  Yes Belkys A Regalado, MD  ferrous sulfate 325 (65 FE) MG tablet Take 1 tablet (325 mg total) by mouth 3 (three) times daily with meals. 07/07/14  Yes Francesca Oman, DO  furosemide (LASIX) 20 MG tablet Take 1 tablet (20 mg total) by mouth daily. 07/07/14 07/07/15 Yes Alex Ronnie Derby, DO  glimepiride (AMARYL) 1 MG tablet Take 1 tablet (1 mg total) by mouth daily with breakfast. 07/07/14  Yes Francesca Oman, DO  hydrALAZINE (APRESOLINE)  50 MG tablet Take 1 tablet (50 mg total) by mouth every 8 (eight) hours. 07/07/14  Yes Francesca Oman, DO  isosorbide mononitrate (IMDUR) 30 MG 24 hr tablet Take 1 tablet (30 mg total) by mouth daily. 07/07/14  Yes Francesca Oman, DO   No Active Allergies  FAMILY HISTORY:  Family History  Problem Relation Age of Onset  . GI problems      none   SOCIAL HISTORY:  reports that he quit smoking about 21 years ago. His smoking use included Cigarettes. He smoked 0.00 packs per day for 20 years. He has never used smokeless tobacco. He reports that he does not drink alcohol or use illicit drugs.  REVIEW OF SYSTEMS:  Unable to obtain 2/2 intubation.  SUBJECTIVE:   VITAL SIGNS: Temp:  [100.6 F (38.1 C)-101.8 F (38.8 C)] 100.6 F (38.1 C) (08/16 2015) Pulse Rate:  [106-142] 106 (08/16 2015) Resp:  [17-31] 24 (08/16 2015) BP: (122-209)/(70-125) 122/70 mmHg (08/16 2015) SpO2:  [84 %-100 %] 100 % (08/16 2015) FiO2 (%):  [50 %] 50 % (08/16 1930) Weight:  [147 lb 7.8 oz (66.9 kg)] 147 lb 7.8 oz (66.9 kg) (08/16 1700) HEMODYNAMICS:   VENTILATOR SETTINGS: Vent Mode:  [-] PRVC FiO2 (%):  [50 %] 50 % Set Rate:  [24 bmp] 24 bmp Vt Set:  [370 mL-500 mL] 370 mL PEEP:  [5 cmH20] 5 cmH20 Plateau Pressure:  [22 cmH20] 22 cmH20 INTAKE / OUTPUT: Intake/Output     08/16 0701 - 08/17 0700   I.V. (mL/kg) 2000 (29.9)   Total Intake(mL/kg) 2000 (29.9)   Net +2000         PHYSICAL EXAMINATION: General:  Thin black man, chronically ill-appearing Neuro:  Sedated HEENT:  Moist mucus membranes Neck: No adenopathy, no JVD Cardiovascular:  Tachycardic rate, regular, no M/R/G Lungs:  Mechanical breath sounds heard bilaterally Abdomen:  Soft, non-distended. Musculoskeletal:  B LE noted, discoloration over L foot, with ulceration. Skin:  Ruptured bullae (appear chronic) over B lower extremities, R worse than L.  LABS:  CBC  Recent Labs Lab 07/10/14 1610  WBC 21.4*  HGB 10.2*  HCT 31.2*  PLT  457*   Coag's No results found for this basename: APTT, INR,  in the last 168 hours BMET  Recent Labs Lab 07/06/14 1606 07/10/14 1610  NA 135 140  K 4.5 4.4  CL 104 101  CO2 22 19  BUN 39* 42*  CREATININE 1.35 1.38*  GLUCOSE 119* 72   Electrolytes  Recent Labs Lab 07/06/14 1606 07/10/14 1610  CALCIUM 9.3 9.8   Sepsis Markers  Recent Labs Lab 07/10/14 1620 07/10/14 1915  LATICACIDVEN 1.61 1.19   ABG No results found for this basename: PHART, PCO2ART, PO2ART,  in the last 168 hours Liver Enzymes  Recent Labs Lab 07/06/14 1606 07/10/14 1610  AST 31 44*  ALT 42 45  ALKPHOS 169* 242*  BILITOT 0.6 0.7  ALBUMIN 3.2* 2.8*   Cardiac Enzymes No results found for this basename: TROPONINI, PROBNP,  in the last 168 hours Glucose  Recent Labs Lab 07/06/14 1431 07/10/14 1554 07/10/14 1701 07/10/14 1741 07/10/14 1840  GLUCAP 152* 83 65* 159* 137*    Imaging Dg Chest Portable 1 View  07/10/2014   CLINICAL DATA:  Status post intubation.  EXAM: PORTABLE CHEST - 1 VIEW  COMPARISON:  Single view of the chest 07/10/2014 at 1611 hr.  FINDINGS: Endotracheal tube is in place with tip in good position at the level of the clavicular heads. Left greater right basilar airspace disease is again seen. Opacity in the right lung base has an appearance most compatible with atelectasis. There is no pneumothorax. There may be a small left pleural effusion.  IMPRESSION: ET tube in good position.  Left worse than right basilar airspace disease. Opacity in the left base could be due to atelectasis or pneumonia. Right basilar opacity has an appearance most consistent with atelectasis.   Electronically Signed   By: Inge Rise M.D.   On: 07/10/2014 19:53   Dg Chest Port 1 View  (if Code Sepsis Called)  07/10/2014   CLINICAL DATA:  Sepsis.  Altered mental status.  EXAM: PORTABLE CHEST - 1 VIEW  COMPARISON:  CT chest 01/30/2014.  PA and lateral chest 04/13/2014.  FINDINGS: Elevation of  the right hemidiaphragm is again seen. There is bibasilar airspace disease. There is also likely a small left pleural effusion. No pneumothorax identified. Heart size is upper normal.  IMPRESSION: Bibasilar airspace disease does not appear markedly changed and is likely due to chronic atelectasis or pneumonia.  Small left pleural effusion.   Electronically Signed   By: Inge Rise M.D.   On: 07/10/2014 16:28   Dg Foot 2 Views Left  07/10/2014   CLINICAL DATA:  Diabetic foot ulcer.  EXAM: LEFT FOOT - 2 VIEW  COMPARISON:  MRI 01/09/2014.  FINDINGS: There is a small ulcer on the plantar aspect of the forefoot overlying the region of the first metatarsal phalangeal joint. There is diffuse soft tissue swelling. No definite destructive bony changes to suggest osteomyelitis.  IMPRESSION: Marked diffuse soft tissue swelling involving the forefoot, particularly surrounding the great toe.  Open wound on the plantar aspect of the first metatarsal phalangeal joint.  No definite destructive bony changes to suggest osteomyelitis.   Electronically Signed   By: Kalman Jewels M.D.   On: 07/10/2014 18:11    EKG:  Non obtained CXR: Reviewed, atelectasis of RML, ETT in good position.  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  CARDIOVASCULAR A: Hypertension History of cardiac arrest Heart Failure with Preserved EF P:   - Hold home medication in setting of sepsis for now - propofol for sedation may provide some BP control. Additional medications (labeltolol,  etc) can be given to maintain SBP <180. - Since patient is on clonidine at home (low dose), would restart this first to prevent rebound.  RENAL A: CKD with nephrotic syndrome P:   No  AKI at this time; patient is on oral sulfonylurea which is renally cleared and may have been supratheraputic. Hold for now. Hold lasix in setting of sepsis, but low threshold to diurese patient.  GASTROINTESTINAL A: History of C. Diff P:   - Check C. Diff PCR  HEMATOLOGIC A: Leukocysosis Anemia, chronic P:   Due to sepsis Check serial CBCs Hold iron in setting of sepsis   INFECTIOUS A: Severe sepsis due to diabetic foot ulcer infection P: Clinical history favors diabetic foot ulcer infection, although C. Diff is also in the differential. Will treat broadly, consider consultation of orthopedics tomorrow as well as advanced imaging to assess for need for surgical management.  Culture data is limited given prior administration of broad-spectrum ABX. F/u C. Diff PCR.  ENDOCRINE A: DM2 P:   q2 glucose given prior hypoglycemia. Hold sulfonylurea and ordered sensitive SSI.  NEUROLOGIC A: Metabolic Encephalopathy Possible Seizure P:   Likely due to sepsis, but head CT ordered as well. Hypoglycemia wasn't in the range to expect seizure activity, although this is also a possibility.  BEST PRACTICE / DISPOSITION Level of Care:  ICU Primary Service:  PCCM Consultants:  None Code Status:  Full Diet:  Tube feeds DVT Px:  Heparin & SCDs GI Px:  famtoidine Skin Integrity:  Breakdown on legs noted Social / Family:  Updated  TODAY'S SUMMARY:  Admitted w/ severe sepsis due to diabetic foot infection.  I have personally obtained a history, examined the patient, evaluated laboratory and imaging results, formulated the assessment and plan and placed orders.  CRITICAL CARE: The patient is critically ill with multiple organ systems failure and requires high complexity decision making for assessment  and support, frequent evaluation and titration of therapies, application of advanced monitoring technologies and extensive interpretation of multiple databases. Critical Care Time devoted to patient care services described in this note is 100 minutes.   Luz Brazen, MD Pulmonary & Critical Care Medicine July 10, 2014, 8:24 PM

## 2014-07-10 NOTE — ED Provider Notes (Signed)
CSN: [id number]     Arrival date & time 07/10/14  1538 History   First MD Initiated Contact with Patient 07/10/14 1540     Chief Complaint  Patient presents with  . Code Sepsis  . Altered Mental Status  . Hypoglycemia     (Consider location/radiation/quality/duration/timing/severity/associated sxs/prior Treatment) The history is provided by medical records.   Joshua [name] is a 63 year old male with a past medical history hypertension, diabetes, or nephrotic syndrome, CHF presents to the emergency department with fever and altered mental status. Patient was found by relatives at SNF unconscious. He was found to have a blood sugar of 30. EMS was called he was given an amp of D50 and his blood sugar rose to 130.  Upon arrival patient was found tachypneic, tachycardic, hypertensive to 200/124. Level II sepsis called. Initial rectal found to be 101.8. In oxygen saturations 84% on room air. Patient's last admission at the hospital is for CHF exacerbation. History of diastolic heart failure EF 55-60 % . 5 therapeutic abortion I. due to the patient's condition Past Medical History  Diagnosis Date  . 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 Hypertension 10/14/2012  . Type 2 diabetes mellitus   . Diabetic nephropathy with proteinuria   . 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 Diastolic heart failure   . Cellulitis   . Convulsions   . Iron deficiency anemia   . 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 Anasarca   . Hypoalbuminemia   . Elevated alkaline phosphatase level   . Microcytic anemia   . GI bleed   . Nephrotic syndrome   . Difficulty in walking(719.7)   . Muscle weakness (generalized)   . Type II or unspecified type diabetes mellitus with renal manifestations, not stated as uncontrolled   . Iron deficiency anemia, unspecified    Past Surgical History  Procedure Laterality Date  . Knee arthroscopy  2010    3 times,  right knee  . I&d extremity Left 01/17/2014    Procedure: IRRIGATION AND DEBRIDEMENT  LEFT FOOT;  Surgeon: Wylene Simmer, MD;  Location: [facility];  Service: Orthopedics;  Laterality: Left;   Family History  Problem Relation Age of Onset  . GI problems      none   History  Substance Use Topics  . Smoking status: Former Smoker -- 20 years    Types: Cigarettes    Quit date: 11/25/1992  . Smokeless tobacco: Never Used  . Alcohol Use: No     Comment: Rarely.    Review of Systems  Unable to perform ROS: Acuity of condition      Allergies  Aspirin  Home Medications   Prior to Admission medications   Medication Sig Start Date End Date Taking? Authorizing Provider  acetaminophen (TYLENOL) 325 MG tablet Take 650 mg by mouth every 4 (four) hours as needed for mild pain, moderate pain or fever (Over 100 degrees.).    Historical Provider, MD  aspirin EC 81 MG tablet Take 81 mg by mouth daily.    Historical Provider, MD  cloNIDine (CATAPRES) 0.1 MG tablet Take 1 tablet (0.1 mg total) by mouth 3 (three) times daily. 02/11/14   Belkys A Regalado, MD  ferrous sulfate 325 (65 FE) MG tablet Take 1 tablet (325 mg total) by mouth 3 (three) times daily with meals. 07/07/14   Francesca Oman, DO  furosemide (LASIX) 20 MG tablet Take 1 tablet (20 mg  total) by mouth daily. 07/07/14 07/07/15  Francesca Oman, DO  glimepiride (AMARYL) 1 MG tablet Take 1 tablet (1 mg total) by mouth daily with breakfast. 07/07/14   Francesca Oman, DO  hydrALAZINE (APRESOLINE) 50 MG tablet Take 1 tablet (50 mg total) by mouth every 8 (eight) hours. 07/07/14   Francesca Oman, DO  ipratropium-albuterol (DUONEB) 0.5-2.5 (3) MG/3ML SOLN Take 3 mLs by nebulization every 4 (four) hours as needed (For SOB and Wheezing).     Historical Provider, MD  isosorbide mononitrate (IMDUR) 30 MG 24 hr tablet Take 1 tablet (30 mg total) by mouth daily. 07/07/14   Francesca Oman, DO   BP 189/104  Pulse 142  Temp(Src) 101.8 F (38.8 C) (Rectal)  Resp 23   SpO2 84% Physical Exam  Vitals reviewed. Constitutional: He is oriented to person, place, and time. He appears well-developed and well-nourished. He appears distressed.  Strong odor of urine   HENT:  Head: Normocephalic and atraumatic.  Dry oral mucosa  Eyes: Conjunctivae are normal. Pupils are equal, round, and reactive to light.  Arcus senilis  Neck: Normal range of motion. No JVD present.  Cardiovascular: Regular rhythm and intact distal pulses.   tachycardic   Pulmonary/Chest: He is in respiratory distress.  Patient  Tachypneic,  Belly breathing   Abdominal: Soft. Bowel sounds are normal.  Neurological: He is alert and oriented to person, place, and time.  Patient oreinted to Person, place, and time, however he is confused. Asking illogical questions about the trashcan  Skin: He is diaphoretic.  Bilateral lower extremity pitting edema, heat, redness, or warmth.  The left great toe is swollen, tender to palpation. There is a centimeter elliptical open wound on the plantar surface of the base of the left eye with active purulent discharge. Right shin with a 6 cm decompressed bolus lesion. Distal pulses are bounding.     ED Course  Procedures (including critical care time) Labs Review Labs Reviewed  MRSA PCR SCREENING - Abnormal; Notable for the following:    MRSA by PCR POSITIVE (*)    All other components within normal limits  URINALYSIS, ROUTINE W REFLEX MICROSCOPIC - Abnormal; Notable for the following:    APPearance CLOUDY (*)    Glucose, UA 100 (*)    Hgb urine dipstick TRACE (*)    Protein, ur >300 (*)    All other components within normal limits  CBC WITH DIFFERENTIAL - Abnormal; Notable for the following:    WBC 21.4 (*)    RBC 3.73 (*)    Hemoglobin 10.2 (*)    HCT 31.2 (*)    RDW 16.0 (*)    Platelets 457 (*)    Neutrophils Relative % 90 (*)    Neutro Abs 19.3 (*)    Lymphocytes Relative 5 (*)    Monocytes Absolute 1.1 (*)    All other components within  normal limits  COMPREHENSIVE METABOLIC PANEL - Abnormal; Notable for the following:    BUN 42 (*)    Creatinine, Ser 1.38 (*)    Total Protein 8.9 (*)    Albumin 2.8 (*)    AST 44 (*)    Alkaline Phosphatase 242 (*)    GFR calc non Af Amer 53 (*)    GFR calc Af Amer 62 (*)    Anion gap 20 (*)    All other components within normal limits  URINE MICROSCOPIC-ADD ON - Abnormal; Notable for the following:    Casts HYALINE CASTS (*)  All other components within normal limits  CBC - Abnormal; Notable for the following:    WBC 17.3 (*)    RBC 3.76 (*)    Hemoglobin 10.1 (*)    HCT 31.7 (*)    RDW 15.9 (*)    All other components within normal limits  COMPREHENSIVE METABOLIC PANEL - Abnormal; Notable for the following:    Glucose, Bld 119 (*)    BUN 37 (*)    Creatinine, Ser 1.44 (*)    Albumin 2.2 (*)    Alkaline Phosphatase 237 (*)    GFR calc non Af Amer 51 (*)    GFR calc Af Amer 59 (*)    Anion gap 16 (*)    All other components within normal limits  URINALYSIS, ROUTINE W REFLEX MICROSCOPIC - Abnormal; Notable for the following:    APPearance TURBID (*)    Glucose, UA 100 (*)    Hgb urine dipstick SMALL (*)    Protein, ur >300 (*)    All other components within normal limits  TROPONIN I - Abnormal; Notable for the following:    Troponin I 0.46 (*)    All other components within normal limits  PROTIME-INR - Abnormal; Notable for the following:    Prothrombin Time 16.1 (*)    All other components within normal limits  FIBRINOGEN - Abnormal; Notable for the following:    Fibrinogen 652 (*)    All other components within normal limits  HEMOGLOBIN A1C - Abnormal; Notable for the following:    Hemoglobin A1C 6.7 (*)    Mean Plasma Glucose 146 (*)    All other components within normal limits  URINE MICROSCOPIC-ADD ON - Abnormal; Notable for the following:    Bacteria, UA FEW (*)    Casts GRANULAR CAST (*)    Crystals URIC ACID CRYSTALS (*)    All other components within  normal limits  GLUCOSE, CAPILLARY - Abnormal; Notable for the following:    Glucose-Capillary 101 (*)    All other components within normal limits  TROPONIN I - Abnormal; Notable for the following:    Troponin I 0.49 (*)    All other components within normal limits  TROPONIN I - Abnormal; Notable for the following:    Troponin I 0.44 (*)    All other components within normal limits  TROPONIN I - Abnormal; Notable for the following:    Troponin I 0.33 (*)    All other components within normal limits  CBC - Abnormal; Notable for the following:    WBC 18.9 (*)    RBC 3.06 (*)    Hemoglobin 8.1 (*)    HCT 25.4 (*)    RDW 16.3 (*)    All other components within normal limits  BASIC METABOLIC PANEL - Abnormal; Notable for the following:    BUN 35 (*)    Creatinine, Ser 1.39 (*)    GFR calc non Af Amer 53 (*)    GFR calc Af Amer 61 (*)    All other components within normal limits  GLUCOSE, CAPILLARY - Abnormal; Notable for the following:    Glucose-Capillary 106 (*)    All other components within normal limits  GLUCOSE, CAPILLARY - Abnormal; Notable for the following:    Glucose-Capillary 106 (*)    All other components within normal limits  GLUCOSE, CAPILLARY - Abnormal; Notable for the following:    Glucose-Capillary 104 (*)    All other components within normal limits  GLUCOSE, CAPILLARY -  Abnormal; Notable for the following:    Glucose-Capillary 126 (*)    All other components within normal limits  GLUCOSE, CAPILLARY - Abnormal; Notable for the following:    Glucose-Capillary 137 (*)    All other components within normal limits  I-STAT VENOUS BLOOD GAS, ED - Abnormal; Notable for the following:    pH, Ven 7.344 (*)    pCO2, Ven 29.2 (*)    pO2, Ven 55.0 (*)    Bicarbonate 15.6 (*)    Acid-base deficit 9.0 (*)    All other components within normal limits  CBG MONITORING, ED - Abnormal; Notable for the following:    Glucose-Capillary 65 (*)    All other components within  normal limits  CBG MONITORING, ED - Abnormal; Notable for the following:    Glucose-Capillary 159 (*)    All other components within normal limits  CBG MONITORING, ED - Abnormal; Notable for the following:    Glucose-Capillary 137 (*)    All other components within normal limits  CBG MONITORING, ED - Abnormal; Notable for the following:    Glucose-Capillary 118 (*)    All other components within normal limits  I-STAT VENOUS BLOOD GAS, ED - Abnormal; Notable for the following:    pH, Ven 7.231 (*)    pCO2, Ven 55.9 (*)    pO2, Ven 53.0 (*)    Acid-base deficit 4.0 (*)    All other components within normal limits  I-STAT ARTERIAL BLOOD GAS, ED - Abnormal; Notable for the following:    pH, Arterial 7.304 (*)    pO2, Arterial 67.0 (*)    Acid-base deficit 5.0 (*)    All other components within normal limits  CULTURE, RESPIRATORY (NON-EXPECTORATED)  CULTURE, BLOOD (ROUTINE X 2)  CULTURE, BLOOD (ROUTINE X 2)  URINE CULTURE  CULTURE, BLOOD (ROUTINE X 2)  URINE CULTURE  CLOSTRIDIUM DIFFICILE BY PCR  LACTIC ACID, PLASMA  CORTISOL  APTT  PROCALCITONIN  PROCALCITONIN  GLUCOSE, CAPILLARY  GLUCOSE, CAPILLARY  GLUCOSE, CAPILLARY  PROCALCITONIN  CBC WITH DIFFERENTIAL  COMPREHENSIVE METABOLIC PANEL  BLOOD GAS, ARTERIAL  TRIGLYCERIDES  CBG MONITORING, ED  I-STAT CG4 LACTIC ACID, ED  I-STAT CG4 LACTIC ACID, ED  TYPE AND SCREEN    Imaging Review No results found.   EKG Interpretation None      CRITICAL CARE Performed by: Margarita Mail   Total critical care time: 27  Critical care time was exclusive of separately billable procedures and treating other patients.  Critical care was necessary to treat or prevent imminent or life-threatening deterioration.  Critical care was time spent personally by me on the following activities: development of treatment plan with patient and/or surrogate as well as nursing, discussions with consultants, evaluation of patient's response to  treatment, examination of patient, obtaining history from patient or surrogate, ordering and performing treatments and interventions, ordering and review of laboratory studies, ordering and review of radiographic studies, pulse oximetry and re-evaluation of patient's condition.   MDM   Final diagnoses:  None    4:48 PM BP 209/108  Pulse 142  Temp(Src) 101.8 F (38.8 C) (Rectal)  Resp 23  SpO2 100% Patient seen in shared visit with Dr. Colin Rhein.  level II sepsis called.  Concern for urosepsis. ? Pneumonia  Vs. volume overload considering his hypoxia.   5:13 PM Patient with gangrenous appearing L foot. Xray ordered. Appears more comfortable on bipap.   6:05 PM vbg shows compensated medtabolic acisosis- I question if this is secondary to his  known nephrotic syndrome.  ua appears negative. CXR unchanged and on signs of edema or pneumonia. [place] [address] with a left shift. His creatinine appears stable.  Anion gap of 20. No ketones in urine 6:26 PM BP 195/103  Pulse 134  Temp(Src) 101.8 F (38.8 C) (Rectal)  Resp 24  Ht 5' 4.96" (1.65 m)  Wt 147 lb 7.8 oz (66.9 kg)  BMI 24.57 kg/m2  SpO2 100% Patient admitted in February for I&D of the Left foot by Dr. Doran Durand. There appears to have infection. Has had difficulty with healing     6:37 PM BP 195/103  Pulse 134  Temp(Src) 101.8 F (38.8 C) (Rectal)  Resp 24  Ht 5' 4.96" (1.65 m)  Wt 147 lb 7.8 oz (66.9 kg)  BMI 24.57 kg/m2  SpO2 100% Patient becoming more lethargic and less responsive.  GCS 11 (2,2,5) Patient removed from [place] and immediately destaurated to 88%.   6:47 PM BP 184/89  Pulse 129  Temp(Src) 101.8 F (38.8 C) (Rectal)  Resp 17  Ht 5' 4.96" (1.65 m)  Wt 147 lb 7.8 oz (66.9 kg)  BMI 24.57 kg/m2  SpO2 100% Patient with apparent seizure like activity (left eye deviation, right arm drawn up and tonic clonic shaking lasting approx 30. Given 0.5mg   Ativan.    7:46 PM BP 184/89  Pulse  125  Temp(Src) 100.6 F (38.1 C) (Rectal)  Resp 21  Ht 5' 4.96" (1.65 m)  Wt 147 lb 7.8 oz (66.9 kg)  BMI 24.57 kg/m2  SpO2 100% Patient repeat VBG shows worsening hypoxia. Now acidotic at  7.23 Decision to intubate.  I assisted Dr Colin Rhein in successful intubatation. patient accepted to critical care. Appears stable on vent with normalizing vital signs   patient seen and accepted by Dr. Earnstine Regal. I personally reviewed the imaging tests through PACS system. I have reviewed and interpreted Lab values. I reviewed available ER/hospitalization records through the [place], [place] 07/11/14 2143

## 2014-07-10 NOTE — ED Notes (Signed)
CBG 65. PA Harris made aware.

## 2014-07-10 NOTE — ED Notes (Signed)
I Stat Lactic Acid results shown to A. Kenton Kingfisher PA

## 2014-07-11 ENCOUNTER — Ambulatory Visit: Payer: Self-pay

## 2014-07-11 DIAGNOSIS — A419 Sepsis, unspecified organism: Principal | ICD-10-CM

## 2014-07-11 DIAGNOSIS — R609 Edema, unspecified: Secondary | ICD-10-CM

## 2014-07-11 DIAGNOSIS — K810 Acute cholecystitis: Secondary | ICD-10-CM

## 2014-07-11 DIAGNOSIS — F411 Generalized anxiety disorder: Secondary | ICD-10-CM

## 2014-07-11 DIAGNOSIS — R6520 Severe sepsis without septic shock: Secondary | ICD-10-CM

## 2014-07-11 LAB — GLUCOSE, CAPILLARY
GLUCOSE-CAPILLARY: 104 mg/dL — AB (ref 70–99)
GLUCOSE-CAPILLARY: 106 mg/dL — AB (ref 70–99)
GLUCOSE-CAPILLARY: 137 mg/dL — AB (ref 70–99)
Glucose-Capillary: 106 mg/dL — ABNORMAL HIGH (ref 70–99)
Glucose-Capillary: 91 mg/dL (ref 70–99)
Glucose-Capillary: 95 mg/dL (ref 70–99)
Glucose-Capillary: 95 mg/dL (ref 70–99)
Glucose-Capillary: 126 mg/dL — ABNORMAL HIGH (ref 70–99)

## 2014-07-11 LAB — BASIC METABOLIC PANEL
ANION GAP: 14 (ref 5–15)
BUN: 35 mg/dL — ABNORMAL HIGH (ref 6–23)
CHLORIDE: 108 meq/L (ref 96–112)
CO2: 21 mEq/L (ref 19–32)
Calcium: 9 mg/dL (ref 8.4–10.5)
Creatinine, Ser: 1.39 mg/dL — ABNORMAL HIGH (ref 0.50–1.35)
GFR, EST AFRICAN AMERICAN: 61 mL/min — AB (ref >90)
GFR, EST NON AFRICAN AMERICAN: 53 mL/min — AB (ref >90)
Glucose, Bld: 96 mg/dL (ref 70–99)
POTASSIUM: 4.1 meq/L (ref 3.7–5.3)
SODIUM: 143 meq/L (ref 137–147)

## 2014-07-11 LAB — HEMOGLOBIN A1C
HEMOGLOBIN A1C: 6.7 % — AB (ref <5.7)
MEAN PLASMA GLUCOSE: 146 mg/dL — AB (ref <117)

## 2014-07-11 LAB — CBC
HCT: 25.4 % — ABNORMAL LOW (ref 39.0–52.0)
Hemoglobin: 8.1 g/dL — ABNORMAL LOW (ref 13.0–17.0)
MCH: 26.5 pg (ref 26.0–34.0)
MCHC: 31.9 g/dL (ref 30.0–36.0)
MCV: 83 fL (ref 78.0–100.0)
Platelets: 375 10*3/uL (ref 150–400)
RBC: 3.06 MIL/uL — ABNORMAL LOW (ref 4.22–5.81)
RDW: 16.3 % — AB (ref 11.5–15.5)
WBC: 18.9 10*3/uL — AB (ref 4.0–10.5)

## 2014-07-11 LAB — MRSA PCR SCREENING: MRSA by PCR: POSITIVE — AB

## 2014-07-11 LAB — TROPONIN I
TROPONIN I: 0.33 ng/mL — AB (ref <0.30)
Troponin I: 0.44 ng/mL (ref <0.30)

## 2014-07-11 LAB — PROCALCITONIN
PROCALCITONIN: 0.44 ng/mL
PROCALCITONIN: 0.47 ng/mL

## 2014-07-11 LAB — CORTISOL: CORTISOL PLASMA: 43 ug/dL

## 2014-07-11 MED ORDER — CHLORHEXIDINE GLUCONATE CLOTH 2 % EX PADS
6.0000 | MEDICATED_PAD | Freq: Every day | CUTANEOUS | Status: AC
  Administered 2014-07-11 – 2014-07-15 (×5): 6 via TOPICAL

## 2014-07-11 MED ORDER — VITAL HIGH PROTEIN PO LIQD
1000.0000 mL | ORAL | Status: DC
  Administered 2014-07-12: 1000 mL
  Filled 2014-07-11 (×2): qty 1000

## 2014-07-11 MED ORDER — MUPIROCIN 2 % EX OINT
1.0000 "application " | TOPICAL_OINTMENT | Freq: Two times a day (BID) | CUTANEOUS | Status: AC
  Administered 2014-07-11 – 2014-07-15 (×10): 1 via NASAL
  Filled 2014-07-11 (×2): qty 22

## 2014-07-11 NOTE — Consult Note (Signed)
Reason for Consult: Left foot abscess, right leg blister Referring Physician:  Titus Mould, MD  Joshua [name] is an 63 y.o. male.  HPI: 63 y/o M w/ complex history of DM, c/b diabetic nephropathy with nephrotic proteinuria, chronic non-healing diabetic foot ulcer, hx of cardiac arrest in March '15 (unknown cause) who presents w/ fever, hypoglycemia, AKI, and gap metabolic acidosis  Asked to evaluate status of his left foot as in pertains to this admission Obviously not well and being medical managed currently   Past Medical History  Diagnosis Date  . 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 Hypertension 10/14/2012  . Type 2 diabetes mellitus   . Diabetic nephropathy with proteinuria   . 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 Diastolic heart failure   . Cellulitis   . Convulsions   . Iron deficiency anemia   . 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 Anasarca   . Hypoalbuminemia   . Elevated alkaline phosphatase level   . Microcytic anemia   . GI bleed   . Nephrotic syndrome   . Difficulty in walking(719.7)   . Muscle weakness (generalized)   . Type II or unspecified type diabetes mellitus with renal manifestations, not stated as uncontrolled   . Iron deficiency anemia, unspecified     Past Surgical History  Procedure Laterality Date  . Knee arthroscopy  2010    3 times, right knee  . I&d extremity Left 01/17/2014    Procedure: IRRIGATION AND DEBRIDEMENT  LEFT FOOT;  Surgeon: Wylene Simmer, MD;  Location: [facility];  Service: Orthopedics;  Laterality: Left;    Family History  Problem Relation Age of Onset  . GI problems      none    Social History:  reports that he quit smoking about 21 years ago. His smoking use included Cigarettes. He smoked 0.00 packs per day for 20 years. He has never used smokeless tobacco. He reports that he does not drink alcohol or use illicit drugs.  Allergies: No Active  Allergies  Medications:  I have reviewed the patient's current medications. Scheduled: . antiseptic oral rinse  7 mL Mouth Rinse QID  . chlorhexidine  15 mL Mouth Rinse BID  . Chlorhexidine Gluconate Cloth  6 each Topical Q0600  . famotidine  20 mg Per Tube BID  . feeding supplement (VITAL HIGH PROTEIN)  1,000 mL Per Tube Q24H  . heparin  5,000 Units Subcutaneous 3 times per day  . insulin aspart  0-9 Units Subcutaneous 6 times per day  . mupirocin ointment  1 application Nasal BID  . piperacillin-tazobactam (ZOSYN)  IV  3.375 g Intravenous Q8H  . vancomycin  500 mg Intravenous Q12H    Results for orders placed during the hospital encounter of 07/10/14 (from the past 24 hour(s))  GLUCOSE, CAPILLARY     Status: Abnormal   Collection Time    07/10/14 10:11 PM      Result Value Ref Range   Glucose-Capillary 101 (*) 70 - 99 mg/dL   Comment 1 Notify RN    MRSA PCR SCREENING     Status: Abnormal   Collection Time    07/10/14 10:16 PM      Result Value Ref Range   MRSA by PCR POSITIVE (*) NEGATIVE  PROCALCITONIN     Status: None   Collection Time    07/10/14 10:55 PM      Result Value Ref Range   Procalcitonin  0.44    TROPONIN I     Status: Abnormal   Collection Time    07/10/14 10:55 PM      Result Value Ref Range   Troponin I 0.49 (*) <0.30 ng/mL  GLUCOSE, CAPILLARY     Status: Abnormal   Collection Time    07/11/14 12:20 AM      Result Value Ref Range   Glucose-Capillary 106 (*) 70 - 99 mg/dL   Comment 1 Notify RN    GLUCOSE, CAPILLARY     Status: Abnormal   Collection Time    07/11/14  2:19 AM      Result Value Ref Range   Glucose-Capillary 106 (*) 70 - 99 mg/dL  GLUCOSE, CAPILLARY     Status: None   Collection Time    07/11/14  4:17 AM      Result Value Ref Range   Glucose-Capillary 91  70 - 99 mg/dL  TROPONIN I     Status: Abnormal   Collection Time    07/11/14  5:37 AM      Result Value Ref Range   Troponin I 0.44 (*) <0.30 ng/mL  CBC     Status: Abnormal    Collection Time    07/11/14  5:37 AM      Result Value Ref Range   WBC 18.9 (*) 4.0 - 10.5 K/uL   RBC 3.06 (*) 4.22 - 5.81 MIL/uL   Hemoglobin 8.1 (*) 13.0 - 17.0 g/dL   HCT 25.4 (*) 39.0 - 52.0 %   MCV 83.0  78.0 - 100.0 fL   MCH 26.5  26.0 - 34.0 pg   MCHC 31.9  30.0 - 36.0 g/dL   RDW 16.3 (*) 11.5 - 15.5 %   Platelets 375  150 - 400 K/uL  BASIC METABOLIC PANEL     Status: Abnormal   Collection Time    07/11/14  5:37 AM      Result Value Ref Range   Sodium 143  137 - 147 mEq/L   Potassium 4.1  3.7 - 5.3 mEq/L   Chloride 108  96 - 112 mEq/L   CO2 21  19 - 32 mEq/L   Glucose, Bld 96  70 - 99 mg/dL   BUN 35 (*) 6 - 23 mg/dL   Creatinine, Ser 1.39 (*) 0.50 - 1.35 mg/dL   Calcium 9.0  8.4 - 10.5 mg/dL   GFR calc non Af Amer 53 (*) >90 mL/min   GFR calc Af Amer 61 (*) >90 mL/min   Anion gap 14  5 - 15  PROCALCITONIN     Status: None   Collection Time    07/11/14  5:37 AM      Result Value Ref Range   Procalcitonin 0.47    GLUCOSE, CAPILLARY     Status: None   Collection Time    07/11/14  8:18 AM      Result Value Ref Range   Glucose-Capillary 95  70 - 99 mg/dL  GLUCOSE, CAPILLARY     Status: Abnormal   Collection Time    07/11/14 10:33 AM      Result Value Ref Range   Glucose-Capillary 104 (*) 70 - 99 mg/dL  TROPONIN I     Status: Abnormal   Collection Time    07/11/14 10:37 AM      Result Value Ref Range   Troponin I 0.33 (*) <0.30 ng/mL  GLUCOSE, CAPILLARY     Status: None   Collection Time  07/11/14  1:03 PM      Result Value Ref Range   Glucose-Capillary 95  70 - 99 mg/dL  GLUCOSE, CAPILLARY     Status: Abnormal   Collection Time    07/11/14  3:49 PM      Result Value Ref Range   Glucose-Capillary 126 (*) 70 - 99 mg/dL  GLUCOSE, CAPILLARY     Status: Abnormal   Collection Time    07/11/14  7:53 PM      Result Value Ref Range   Glucose-Capillary 137 (*) 70 - 99 mg/dL    X-ray: CLINICAL DATA: Diabetic foot ulcer.  EXAM:  LEFT FOOT - 2 VIEW   COMPARISON: MRI 01/09/2014.  FINDINGS:  There is a small ulcer on the plantar aspect of the forefoot  overlying the region of the first metatarsal phalangeal joint. There  is diffuse soft tissue swelling. No definite destructive bony  changes to suggest osteomyelitis.  IMPRESSION:  Marked diffuse soft tissue swelling involving the forefoot,  particularly surrounding the great toe.  Open wound on the plantar aspect of the first metatarsal phalangeal  joint.  No definite destructive bony changes to suggest osteomyelitis   ROS Daughter in room States that he was aware of at least plantar wound but had not yet sought medical attention O/W intubated Per medical admission note to review  Blood pressure 98/51, pulse 75, temperature 99.5 F (37.5 C), temperature source Core (Comment), resp. rate 24, height 5' 4.96" (1.65 m), weight 64.7 kg (142 lb 10.2 oz), SpO2 100.00%.  Physical Exam Intubated and sedated General medical exam reviewed from admission and deferred to their expertise Ortho exam Right leg with posterior superficial blistering with only superficial epidermal involvement, specifically no obvious infection or exposed underlying soft tissue or muscle  Left foot, forefoot swelling focused over great toe.  Obvious plantar wound, with expressible pus drained.  When i was doing this I inadvertently further decompressed dorsally with expression of moderate pus from this area  Palpable DP and PT pulses in the foot No obvious streaking cellulitis left or right leg  Assessment/Plan: Right posterior leg blistering with stable wound, purely epidermal involvement  Dressings as needed to prevent infection  Left forefoot (Great toe) infection, likely osteomyelitis  In room tonight great toes abscess decompressed, both dorsal and plantar wounds dressed   On IV VANC and Zosyn  Needs MRI to evaluate extend of foot involvement.  Depending on medical stability and concern level of primary  team MRI can be done once extubated or to facilitate treatment while intubated, will plan to discuss with primary team in the am.  Given that he has palpable pulse distally it is possible he could could heal a modified amputation/debridement  I will be asking Dr. Wylene Simmer for assistance in definitive management   Trayveon Beckford D 07/11/2014, 9:45 PM

## 2014-07-11 NOTE — Care Management Note (Addendum)
    Page 1 of 1   07/12/2014     4:40:33 PM CARE MANAGEMENT NOTE 07/12/2014  Patient:  Joshua [name], Joshua [name]   Account Number:  [id number]  Date Initiated:  07/11/2014  Documentation initiated by:  Elissa Hefty  Subjective/Objective Assessment:   adm w sepsis, vent     Action/Plan:   lives alone, pcp dr Cristie Hem wilson   Anticipated DC Date:  07/16/2014   Anticipated DC Plan:  [place]  In-house referral  Clinical Social Worker         Choice offered to / List presented to:             Status of service:   Medicare Important Message given?   (If response is "NO", the following Medicare IM given date fields will be blank) Date Medicare IM given:   Medicare IM given by:   Date Additional Medicare IM given:   Additional Medicare IM given by:    Discharge Disposition:    Per UR Regulation:  Reviewed for med. necessity/level of care/duration of stay  If discussed at [place] of Stay Meetings, dates discussed:    Comments:  07/12/14 Ellan Lambert, RN, BSN [phone] Pt extubated today.  Please consider PT/OT when able to tolerate therapies.  Thank you!

## 2014-07-11 NOTE — Progress Notes (Signed)
I saw and evaluated the patient. I personally confirmed the key portions of Dr. Wilson's history and exam and reviewed pertinent patient test results. The assessment, diagnosis, and plan were formulated together and I agree with the documentation in the resident's note. 

## 2014-07-11 NOTE — Progress Notes (Signed)
INITIAL NUTRITION ASSESSMENT  DOCUMENTATION CODES Per approved criteria  -Not Applicable   INTERVENTION:  Continue to increase Vital HP formula to goal rate of 55 ml/hr to provide 1320 kcals, 115 gm protein, 1104 ml of free water -- remaining estimated kcal needs to be met with current Propofol infusion RD to follow for nutrition care plan  NUTRITION DIAGNOSIS: Inadequate oral intake related to inability to eat as evidenced by NPO status  Goal: Pt to meet >/= 90% of their estimated nutrition needs   Monitor:  TF regimen & tolerance, respiratory status, Propofol infusion, weight, labs, I/O's  Reason for Assessment: Consult  63 y.o. male  Admitting Dx: AMS  ASSESSMENT: 63 y/o Male w/complex history of DM, c/b diabetic nephropathy with nephrotic proteinuria, chronic non-healing diabetic foot ulcer, hx of cardiac arrest in March '15 (unknown cause) who presented w/ fever, hypoglycemia, AKI, and gap metabolic acidosis.  Patient is currently intubated on ventilator support MV: 8.9 L/min Temp (24hrs), Avg:99.2 F (37.3 C), Min:97.5 F (36.4 C), Max:101.8 F (38.8 C)   Propofol: 21.5 ml/hr -----> 567 fat kcals       Vital HP formula initiated via Adult Tube Feeding Protocol 8/16.  Currently infusing at 30 ml/hr via OGT providing 720 kcals, 63 gm protein, 602 ml of free water.  Advancing to goal rate of 40 ml/hr.  RD consulted for TF initiation & management.  No muscle or subcutaneous fat depletion noticed.  Height: Ht Readings from Last 1 Encounters:  07/10/14 5' 4.96" (1.65 m)    Weight: Wt Readings from Last 1 Encounters:  07/11/14 142 lb 10.2 oz (64.7 kg)    Ideal Body Weight: 136 lb  % Ideal Body Weight: 104%  Wt Readings from Last 10 Encounters:  07/11/14 142 lb 10.2 oz (64.7 kg)  07/06/14 147 lb 8 oz (66.906 kg)  06/15/14 139 lb (63.05 kg)  04/18/14 125 lb 14.1 oz (57.1 kg)  03/17/14 142 lb 12.8 oz (64.774 kg)  02/12/14 145 lb 3.7 oz (65.877 kg)   02/12/14 145 lb 3.7 oz (65.877 kg)  01/21/14 158 lb 15.2 oz (72.1 kg)  01/21/14 158 lb 15.2 oz (72.1 kg)  01/05/14 150 lb 3.2 oz (68.13 kg)    Usual Body Weight: 145 lb  % Usual Body Weight: 98%  BMI:  Body mass index is 23.76 kg/(m^2).  Estimated Nutritional Needs: Kcal: 1850-2000 Protein: 110-120 gm Fluid: per MD  Skin: full thickness wound to anterior & plantar foot  Diet Order: NPO  EDUCATION NEEDS: -No education needs identified at this time   Intake/Output Summary (Last 24 hours) at 07/11/14 1049 Last data filed at 07/11/14 0902  Gross per 24 hour  Intake 2553.8 ml  Output    895 ml  Net 1658.8 ml    Labs:   Recent Labs Lab 07/10/14 1610 07/10/14 2109 07/11/14 0537  NA 140 141 143  K 4.4 4.3 4.1  CL 101 106 108  CO2 '19 19 21  ' BUN 42* 37* 35*  CREATININE 1.38* 1.44* 1.39*  CALCIUM 9.8 8.5 9.0  GLUCOSE 72 119* 96    CBG (last 3)   Recent Labs  07/11/14 0417 07/11/14 0818 07/11/14 1033  GLUCAP 91 95 104*    Scheduled Meds: . antiseptic oral rinse  7 mL Mouth Rinse QID  . chlorhexidine  15 mL Mouth Rinse BID  . Chlorhexidine Gluconate Cloth  6 each Topical Q0600  . famotidine  20 mg Per Tube BID  . feeding supplement (VITAL HIGH  PROTEIN)  1,000 mL Per Tube Q24H  . heparin  5,000 Units Subcutaneous 3 times per day  . insulin aspart  0-9 Units Subcutaneous 6 times per day  . mupirocin ointment  1 application Nasal BID  . piperacillin-tazobactam (ZOSYN)  IV  3.375 g Intravenous Q8H  . vancomycin  500 mg Intravenous Q12H    Continuous Infusions: . propofol 55 mcg/kg/min (07/11/14 0930)    Past Medical History  Diagnosis Date  . 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 Hypertension 10/14/2012  . Type 2 diabetes mellitus   . Diabetic nephropathy with proteinuria   . 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 Diastolic heart failure   . Cellulitis   . Convulsions   . Iron deficiency anemia   . Venous thrombosis and  embolism FEB 2015    ACUTE DVTs, bilateral PERONEAL   . Anasarca   . Hypoalbuminemia   . Elevated alkaline phosphatase level   . Microcytic anemia   . GI bleed   . Nephrotic syndrome   . Difficulty in walking(719.7)   . Muscle weakness (generalized)   . Type II or unspecified type diabetes mellitus with renal manifestations, not stated as uncontrolled   . Iron deficiency anemia, unspecified     Past Surgical History  Procedure Laterality Date  . Knee arthroscopy  2010    3 times, right knee  . I&d extremity Left 01/17/2014    Procedure: IRRIGATION AND DEBRIDEMENT  LEFT FOOT;  Surgeon: Wylene Simmer, MD;  Location: [facility];  Service: Orthopedics;  Laterality: Left;    Arthur Holms, RD, LDN Pager #: [phone] After-Hours Pager #: [phone]

## 2014-07-11 NOTE — H&P (Signed)
PULMONARY / CRITICAL CARE MEDICINE HISTORY AND PHYSICAL EXAMINATION   Name: Joshua [name] MRN: [id number] DOB: 22-Apr-1951    ADMISSION DATE:  07/10/2014  PRIMARY SERVICE: PCCM  CHIEF COMPLAINT:  Altered Mental Status  BRIEF PATIENT DESCRIPTION: 63 y/o M w/ complex history of DM, c/b diabetic nephropathy with nephrotic proteinuria, chronic non-healing diabetic foot ulcer, hx of cardiac arrest in March '15 (unknown cause) who presents w/ fever, hypoglycemia, AKI, and gap metabolic acidosis.  SIGNIFICANT EVENTS / STUDIES:  8/16 Intubated in ED 8/16 CT head>>>neg 8/16 CT left lower ext>>>Diffuse cellulitis most notable surrounding the great toe.<BR> <BR>Soft tissue abscess along the dorsal and medial aspect of the<BR>proximal phalanx of the great toe measuring a maximum of 3.2 x 2.0<BR>cm.<BR>  LINES / TUBES: ETT, 7.5 mm -- 8/16 Foley -- 8/16 PIV x2 - 8/16 OGT -- 8/16  CULTURES: Blood x2 -- 8/16 (AFTER Abx dosed by ED) Sputum -- 8/16 Urine -- 8/16  ANTIBIOTICS: Vanc -- 8/16 Zosyn-- 8/16  SUBJECTIVE: not on pressors,, glu better  VITAL SIGNS: Temp:  [97.5 F (36.4 C)-101.8 F (38.8 C)] 98.8 F (37.1 C) (08/17 1000) Pulse Rate:  [69-142] 80 (08/17 1000) Resp:  [13-33] 24 (08/17 1000) BP: (94-209)/(52-125) 123/65 mmHg (08/17 1000) SpO2:  [84 %-100 %] 100 % (08/17 1000) FiO2 (%):  [40 %-50 %] 40 % (08/17 1000) Weight:  [64.7 kg (142 lb 10.2 oz)-66.9 kg (147 lb 7.8 oz)] 64.7 kg (142 lb 10.2 oz) (08/17 0500) HEMODYNAMICS:   VENTILATOR SETTINGS: Vent Mode:  [-] PRVC FiO2 (%):  [40 %-50 %] 40 % Set Rate:  [24 bmp] 24 bmp Vt Set:  [370 mL-500 mL] 370 mL PEEP:  [5 cmH20] 5 cmH20 Plateau Pressure:  [11 cmH20-22 cmH20] 11 cmH20 INTAKE / OUTPUT: Intake/Output     08/16 0701 - 08/17 0700 08/17 0701 - 08/18 0700   I.V. (mL/kg) 2149.9 (33.2) 51.5 (0.8)   NG/GT 110 90   IV Piggyback 150 50   Total Intake(mL/kg) 2409.9 (37.2) 191.5 (3)   Urine (mL/kg/hr) 845 50 (0.2)   Total  Output 845 50   Net +1564.9 +141.5          PHYSICAL EXAMINATION: General:  Thin black man, chronically ill-appearing Neuro:  Sedated rass -1 to -2 HEENT:  per Neck: No adenopathy, no JVD Cardiovascular: s1 s2 reg regular, no M/R/G Lungs:  CTA Abdomen:  Soft, non-distended. No r/g Musculoskeletal:  B LE noted, discoloration over L foot, with ulceration, no oozing, swollen Skin:  Ruptured bullae (appear chronic) over B lower extremities, R worse than L.  LABS:  CBC  Recent Labs Lab 07/10/14 1610 07/10/14 2109 07/11/14 0537  WBC 21.4* 17.3* 18.9*  HGB 10.2* 10.1* 8.1*  HCT 31.2* 31.7* 25.4*  PLT 457* 321 375   Coag's  Recent Labs Lab 07/10/14 2109  APTT 30  INR 1.29   BMET  Recent Labs Lab 07/10/14 1610 07/10/14 2109 07/11/14 0537  NA 140 141 143  K 4.4 4.3 4.1  CL 101 106 108  CO2 19 19 21   BUN 42* 37* 35*  CREATININE 1.38* 1.44* 1.39*  GLUCOSE 72 119* 96   Electrolytes  Recent Labs Lab 07/10/14 1610 07/10/14 2109 07/11/14 0537  CALCIUM 9.8 8.5 9.0   Sepsis Markers  Recent Labs Lab 07/10/14 1620 07/10/14 1915 07/10/14 2109 07/10/14 2255 07/11/14 0537  LATICACIDVEN 1.61 1.19 1.3  --   --   PROCALCITON  --   --   --  0.44 0.47  ABG  Recent Labs Lab 07/10/14 2100  PHART 7.304*  PCO2ART 43.2  PO2ART 67.0*   Liver Enzymes  Recent Labs Lab 07/06/14 1606 07/10/14 1610 07/10/14 2109  AST 31 44* 34  ALT 42 45 33  ALKPHOS 169* 242* 237*  BILITOT 0.6 0.7 0.7  ALBUMIN 3.2* 2.8* 2.2*   Cardiac Enzymes  Recent Labs Lab 07/10/14 2109 07/10/14 2255 07/11/14 0537  TROPONINI 0.46* 0.49* 0.44*   Glucose  Recent Labs Lab 07/10/14 2211 07/11/14 0020 07/11/14 0219 07/11/14 0417 07/11/14 0818 07/11/14 1033  GLUCAP 101* 106* 106* 91 95 104*    Imaging Ct Head Wo Contrast  07/10/2014   CLINICAL DATA:  Severe headache.  EXAM: CT HEAD WITHOUT CONTRAST  TECHNIQUE: Contiguous axial images were obtained from the base of the  skull through the vertex without intravenous contrast.  COMPARISON:  01/30/2014  FINDINGS: Skull and Sinuses:Nasopharyngeal fluid layer related to intubation. Inflammatory mucosal thickening in the paranasal sinuses without definitive acute sinusitis. There is diffuse reticulation of the scalp. No acute calvarial findings.  Orbits: No acute abnormality.  Brain: No evidence of acute abnormality, such as acute infarction, hemorrhage, hydrocephalus, or mass lesion/mass effect. There is mild generalized volume loss which is age appropriate. Stable pattern of mild chronic small vessel disease, with ischemic gliosis best seen around the lateral ventricles.  IMPRESSION: 1. No evidence of acute intracranial disease. 2. Mild chronic small vessel disease.   Electronically Signed   By: Jorje Guild M.D.   On: 07/10/2014 22:16   Ct Foot Left W Contrast  07/10/2014   CLINICAL DATA:  Diffuse soft tissue swelling and open wound on the plantar aspect of the forefoot.  EXAM: CT OF THE LEFT FOOT WITH CONTRAST  TECHNIQUE: Multidetector CT imaging was performed following the standard protocol during bolus administration of intravenous contrast.  CONTRAST:  165mL OMNIPAQUE IOHEXOL 300 MG/ML  SOLN  COMPARISON:  Radiographs 07/10/2014.  FINDINGS: There is diffuse subcutaneous soft tissue swelling/ edema involving the entire foot consistent with cellulitis. This is most significant surrounding the great toe. There is an open wound on the plantar aspect of the forefoot near the level of the first metatarsal phalangeal joint. No obvious findings for septic arthritis or osteomyelitis but MRI would be much more sensitive.  There is an abscess along the dorsal and medial aspect of the proximal phalanx of the great toe measuring a maximum of 3.2 x 2.0 cm. A small amount of air is noted.  The remaining bony structures are intact.  IMPRESSION: Diffuse cellulitis most notable surrounding the great toe.  Soft tissue abscess along the dorsal  and medial aspect of the proximal phalanx of the great toe measuring a maximum of 3.2 x 2.0 cm.  No definite CT findings for septic arthritis or osteomyelitis.   Electronically Signed   By: Kalman Jewels M.D.   On: 07/10/2014 22:18   Dg Chest Portable 1 View  07/10/2014   CLINICAL DATA:  Status post intubation.  EXAM: PORTABLE CHEST - 1 VIEW  COMPARISON:  Single view of the chest 07/10/2014 at 1611 hr.  FINDINGS: Endotracheal tube is in place with tip in good position at the level of the clavicular heads. Left greater right basilar airspace disease is again seen. Opacity in the right lung base has an appearance most compatible with atelectasis. There is no pneumothorax. There may be a small left pleural effusion.  IMPRESSION: ET tube in good position.  Left worse than right basilar airspace disease. Opacity in  the left base could be due to atelectasis or pneumonia. Right basilar opacity has an appearance most consistent with atelectasis.   Electronically Signed   By: Inge Rise M.D.   On: 07/10/2014 19:53   Dg Chest Port 1 View  (if Code Sepsis Called)  07/10/2014   CLINICAL DATA:  Sepsis.  Altered mental status.  EXAM: PORTABLE CHEST - 1 VIEW  COMPARISON:  CT chest 01/30/2014.  PA and lateral chest 04/13/2014.  FINDINGS: Elevation of the right hemidiaphragm is again seen. There is bibasilar airspace disease. There is also likely a small left pleural effusion. No pneumothorax identified. Heart size is upper normal.  IMPRESSION: Bibasilar airspace disease does not appear markedly changed and is likely due to chronic atelectasis or pneumonia.  Small left pleural effusion.   Electronically Signed   By: Inge Rise M.D.   On: 07/10/2014 16:28   Dg Foot 2 Views Left  07/10/2014   CLINICAL DATA:  Diabetic foot ulcer.  EXAM: LEFT FOOT - 2 VIEW  COMPARISON:  MRI 01/09/2014.  FINDINGS: There is a small ulcer on the plantar aspect of the forefoot overlying the region of the first metatarsal phalangeal  joint. There is diffuse soft tissue swelling. No definite destructive bony changes to suggest osteomyelitis.  IMPRESSION: Marked diffuse soft tissue swelling involving the forefoot, particularly surrounding the great toe.  Open wound on the plantar aspect of the first metatarsal phalangeal joint.  No definite destructive bony changes to suggest osteomyelitis.   Electronically Signed   By: Kalman Jewels M.D.   On: 07/10/2014 18:11    EKG:  Non obtained CXR: Reviewed, atelectasis of RML, ETT in good position.  ASSESSMENT / PLAN:  Active Problems:   Severe sepsis with acute organ dysfunction  PULMONARY A: Hypoxic and Hypercapnic Respiratory Failure Pulmonary Edema vs ALI History of positive PPD, s/p treatment by [facility] therapy (AFB cx negative) P:   - abg reviewed, repeat in am, likley resolving co2 with current mV -consider sbt, cpap 5 p5 goal 2 hrs, failed -pcx in am  -keep plat less 30, may need line for cvp, if needed drop TV  CARDIOVASCULAR A: Hypertension History of cardiac arrest Heart Failure with Preserved EF R/o demand ishcemia P:   -clonidine to avoid rebound -tele -no further trop  RENAL A: CKD with nephrotic syndrome P:   Chem in am  Allow pos balance  GASTROINTESTINAL A: History of C. Diff No diarrhea noted P:   - Check C. Diff PCR - consider cancel -start TF -pepcid -lft in am   HEMATOLOGIC A: Leukocysosis Anemia, chronic dvt prevention P:   Sub q hep Check serial CBCs Hold iron in setting of sepsis, may cause harm  INFECTIOUS A: Severe sepsis due to diabetic foot ulcer infection / abscess P: will call ortho for abscess concern on CT Continued vanc, zosyn  ENDOCRINE A: DM2 P:   Change to q4h cbg lft in am   NEUROLOGIC A: Metabolic Encephalopathy Possible Seizure P:   Prn fent Propofol with wua   TODAY'S SUMMARY:  Call ortho, wean attempts, pos balance  I have personally obtained a history, examined the patient,  evaluated laboratory and imaging results, formulated the assessment and plan and placed orders.  CRITICAL CARE: The patient is critically ill with multiple organ systems failure and requires high complexity decision making for assessment and support, frequent evaluation and titration of therapies, application of advanced monitoring technologies and extensive interpretation of multiple databases. Critical Care Time devoted  to patient care services described in this note is 30 minutes.   Lavon Paganini. Titus Mould, MD, [place] Pgr: [facility]

## 2014-07-12 ENCOUNTER — Inpatient Hospital Stay (HOSPITAL_COMMUNITY): Payer: Self-pay

## 2014-07-12 DIAGNOSIS — R269 Unspecified abnormalities of gait and mobility: Secondary | ICD-10-CM

## 2014-07-12 DIAGNOSIS — L97409 Non-pressure chronic ulcer of unspecified heel and midfoot with unspecified severity: Secondary | ICD-10-CM

## 2014-07-12 DIAGNOSIS — E1169 Type 2 diabetes mellitus with other specified complication: Secondary | ICD-10-CM

## 2014-07-12 LAB — CBC WITH DIFFERENTIAL/PLATELET
BASOS ABS: 0 10*3/uL (ref 0.0–0.1)
Basophils Relative: 0 % (ref 0–1)
EOS ABS: 0.3 10*3/uL (ref 0.0–0.7)
Eosinophils Relative: 2 % (ref 0–5)
HCT: 24.2 % — ABNORMAL LOW (ref 39.0–52.0)
Hemoglobin: 7.8 g/dL — ABNORMAL LOW (ref 13.0–17.0)
LYMPHS ABS: 1.9 10*3/uL (ref 0.7–4.0)
Lymphocytes Relative: 15 % (ref 12–46)
MCH: 26.9 pg (ref 26.0–34.0)
MCHC: 32.2 g/dL (ref 30.0–36.0)
MCV: 83.4 fL (ref 78.0–100.0)
Monocytes Absolute: 1.2 10*3/uL — ABNORMAL HIGH (ref 0.1–1.0)
Monocytes Relative: 9 % (ref 3–12)
NEUTROS ABS: 9.5 10*3/uL — AB (ref 1.7–7.7)
Neutrophils Relative %: 74 % (ref 43–77)
Platelets: 446 10*3/uL — ABNORMAL HIGH (ref 150–400)
RBC: 2.9 MIL/uL — ABNORMAL LOW (ref 4.22–5.81)
RDW: 16.1 % — ABNORMAL HIGH (ref 11.5–15.5)
WBC: 12.9 10*3/uL — ABNORMAL HIGH (ref 4.0–10.5)

## 2014-07-12 LAB — BLOOD GAS, ARTERIAL
ACID-BASE DEFICIT: 0.5 mmol/L (ref 0.0–2.0)
BICARBONATE: 24.2 meq/L — AB (ref 20.0–24.0)
Drawn by: 41977
FIO2: 0.4 %
O2 Saturation: 98.2 %
PCO2 ART: 44.4 mmHg (ref 35.0–45.0)
PEEP: 5 cmH2O
Patient temperature: 98.6
RATE: 24 resp/min
TCO2: 25.6 mmol/L (ref 0–100)
VT: 370 mL
pH, Arterial: 7.357 (ref 7.350–7.450)
pO2, Arterial: 129 mmHg — ABNORMAL HIGH (ref 80.0–100.0)

## 2014-07-12 LAB — COMPREHENSIVE METABOLIC PANEL
ALT: 26 U/L (ref 0–53)
ANION GAP: 11 (ref 5–15)
AST: 24 U/L (ref 0–37)
Albumin: 1.8 g/dL — ABNORMAL LOW (ref 3.5–5.2)
Alkaline Phosphatase: 161 U/L — ABNORMAL HIGH (ref 39–117)
BILIRUBIN TOTAL: 0.5 mg/dL (ref 0.3–1.2)
BUN: 40 mg/dL — AB (ref 6–23)
CHLORIDE: 112 meq/L (ref 96–112)
CO2: 23 mEq/L (ref 19–32)
CREATININE: 1.59 mg/dL — AB (ref 0.50–1.35)
Calcium: 8.6 mg/dL (ref 8.4–10.5)
GFR calc Af Amer: 52 mL/min — ABNORMAL LOW (ref >90)
GFR calc non Af Amer: 45 mL/min — ABNORMAL LOW (ref >90)
Glucose, Bld: 133 mg/dL — ABNORMAL HIGH (ref 70–99)
Potassium: 4.1 mEq/L (ref 3.7–5.3)
Sodium: 146 mEq/L (ref 137–147)
TOTAL PROTEIN: 6.5 g/dL (ref 6.0–8.3)

## 2014-07-12 LAB — GLUCOSE, CAPILLARY
GLUCOSE-CAPILLARY: 129 mg/dL — AB (ref 70–99)
GLUCOSE-CAPILLARY: 82 mg/dL (ref 70–99)
Glucose-Capillary: 154 mg/dL — ABNORMAL HIGH (ref 70–99)
Glucose-Capillary: 134 mg/dL — ABNORMAL HIGH (ref 70–99)
Glucose-Capillary: 111 mg/dL — ABNORMAL HIGH (ref 70–99)
Glucose-Capillary: 94 mg/dL (ref 70–99)

## 2014-07-12 LAB — POCT I-STAT 3, ART BLOOD GAS (G3+)
Acid-base deficit: 1 mmol/L (ref 0.0–2.0)
BICARBONATE: 24.4 meq/L — AB (ref 20.0–24.0)
O2 SAT: 97 %
PO2 ART: 95 mmHg (ref 80.0–100.0)
Patient temperature: 99.8
TCO2: 26 mmol/L (ref 0–100)
pCO2 arterial: 42.1 mmHg (ref 35.0–45.0)
pH, Arterial: 7.373 (ref 7.350–7.450)

## 2014-07-12 LAB — URINE CULTURE
CULTURE: NO GROWTH
Colony Count: NO GROWTH

## 2014-07-12 LAB — PROCALCITONIN: Procalcitonin: 0.51 ng/mL

## 2014-07-12 LAB — TRIGLYCERIDES: TRIGLYCERIDES: 163 mg/dL — AB (ref <150)

## 2014-07-12 MED ORDER — CETYLPYRIDINIUM CHLORIDE 0.05 % MT LIQD
7.0000 mL | Freq: Four times a day (QID) | OROMUCOSAL | Status: DC
  Administered 2014-07-13 – 2014-07-14 (×6): 7 mL via OROMUCOSAL

## 2014-07-12 MED ORDER — FENTANYL CITRATE 0.05 MG/ML IJ SOLN
INTRAMUSCULAR | Status: AC
  Administered 2014-07-12: 50 ug via INTRAVENOUS
  Filled 2014-07-12: qty 2

## 2014-07-12 MED ORDER — HYDRALAZINE HCL 20 MG/ML IJ SOLN: 2.0000 mg | INTRAMUSCULAR | Status: DC | PRN

## 2014-07-12 MED ORDER — CETYLPYRIDINIUM CHLORIDE 0.05 % MT LIQD
7.0000 mL | Freq: Two times a day (BID) | OROMUCOSAL | Status: DC
Start: 2014-07-12 — End: 2014-07-12
  Administered 2014-07-12 (×2): 7 mL via OROMUCOSAL

## 2014-07-12 MED ORDER — HALOPERIDOL LACTATE 5 MG/ML IJ SOLN
1.0000 mg | Freq: Once | INTRAMUSCULAR | Status: AC
  Administered 2014-07-12: 1 mg via INTRAVENOUS
  Filled 2014-07-12: qty 1

## 2014-07-12 MED ORDER — SODIUM CHLORIDE 0.45 % IV SOLN
INTRAVENOUS | Status: DC
Start: 2014-07-12 — End: 2014-07-13
  Administered 2014-07-13: 05:00:00 via INTRAVENOUS

## 2014-07-12 MED ORDER — CHLORHEXIDINE GLUCONATE 0.12 % MT SOLN
15.0000 mL | Freq: Two times a day (BID) | OROMUCOSAL | Status: DC
Start: 2014-07-12 — End: 2014-07-19
  Administered 2014-07-12 – 2014-07-19 (×11): 15 mL via OROMUCOSAL
  Filled 2014-07-12 (×12): qty 15

## 2014-07-12 MED ORDER — HYDRALAZINE HCL 20 MG/ML IJ SOLN
10.0000 mg | INTRAMUSCULAR | Status: DC | PRN
  Administered 2014-07-12 – 2014-07-13 (×4): 10 mg via INTRAVENOUS
  Filled 2014-07-12 (×4): qty 1

## 2014-07-12 MED ORDER — FENTANYL CITRATE 0.05 MG/ML IJ SOLN
50.0000 ug | Freq: Once | INTRAMUSCULAR | Status: AC
  Administered 2014-07-12 (×2): 50 ug via INTRAVENOUS

## 2014-07-12 MED ORDER — SODIUM CHLORIDE 0.9 % IV SOLN
1.0000 mg/h | INTRAVENOUS | Status: DC
  Filled 2014-07-12: qty 10

## 2014-07-12 NOTE — Progress Notes (Signed)
eLink Physician-Brief Progress Note Patient Name: Joshua [name] DOB: 1951/08/19 MRN: [id number]   Date of Service  07/12/2014  HPI/Events of Note  Pt with confusion and agitation, s\p extubation today, MRI foot pending. Pulling at foley, RN concerned about over agitation and confusion with going for MRI  eICU Interventions  Possible ICU delirium \\post  extubation.  One dose of fentanyl and Haldol.  If no improvement re-orient pt and reschedule MRI foot for the AM.         Traniyah Hallett S. 07/12/2014, 5:38 PM

## 2014-07-12 NOTE — Consult Note (Addendum)
[facility]     Reason for Consult:probable osteomyelitis    Referring Physician: Dr. Titus Mould  Active Problems:   Severe sepsis with acute organ dysfunction   . antiseptic oral rinse  7 mL Mouth Rinse q12n4p  . chlorhexidine  15 mL Mouth Rinse BID  . Chlorhexidine Gluconate Cloth  6 each Topical Q0600  . famotidine  20 mg Per Tube BID  . heparin  5,000 Units Subcutaneous 3 times per day  . insulin aspart  0-9 Units Subcutaneous 6 times per day  . mupirocin ointment  1 application Nasal BID  . piperacillin-tazobactam (ZOSYN)  IV  3.375 g Intravenous Q8H  . vancomycin  500 mg Intravenous Q12H    Recommendations: Continue with broad spectrum antibiotics pending other cultures   Assessment: He has DM, chronic, non-healing diabetic foot ulcer present for at least this year and with purulence now.  CT did not show definitive osteomyelitis, but likely, MRI pending.  Likely Staph aureus with history of wound abscess with MSSA.    Antibiotics: Vancomycin, zosyn  HPI: Joshua [name] is a 63 y.o. male with DM, diabetic nephropathy, nephrotic syndrome proteinuria, chronic non-healing ulcer on left foot and cardiac arrest in March of this year who presented with confusion, SIRS, leukocytosis.  Had a metabolic acidosis, and required intubation due to respiratory acidosis.  This is his third day of hospitalization.  He was started on empiric vancomycin and zosyn.  He was extubated this am.  Did not required pressor support.  Previous MSSA in wound.    Review of Systems: Review of systems not obtained due to patient factors.  Past Medical History  Diagnosis Date  . 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 Hypertension 10/14/2012  . Type 2 diabetes mellitus   . Diabetic nephropathy with proteinuria   . 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 Diastolic heart failure   . Cellulitis   . Convulsions   . Iron deficiency anemia   . Venous  thrombosis and embolism FEB 2015    ACUTE DVTs, bilateral PERONEAL   . Anasarca   . Hypoalbuminemia   . Elevated alkaline phosphatase level   . Microcytic anemia   . GI bleed   . Nephrotic syndrome   . Difficulty in walking(719.7)   . Muscle weakness (generalized)   . Type II or unspecified type diabetes mellitus with renal manifestations, not stated as uncontrolled   . Iron deficiency anemia, unspecified     History  Substance Use Topics  . Smoking status: Former Smoker -- 20 years    Types: Cigarettes    Quit date: 11/25/1992  . Smokeless tobacco: Never Used  . Alcohol Use: No     Comment: Rarely.    Family History  Problem Relation Age of Onset  . GI problems      none   No Active Allergies  OBJECTIVE: Blood pressure 173/105, pulse 111, temperature 99.4 F (37.4 C), temperature source Core (Comment), resp. rate 19, height 5' 4.96" (1.65 m), weight 141 lb 5 oz (64.1 kg), SpO2 100.00%. General: awake, on nasal cannula, recently extubated Skin: no rashes, multiple lesions on legs, left great toe with open wound with drainage Lungs: diffuse rhonchi Cor: tachy RR Abdomen: soft, nt, nd Ext: no edema  Microbiology: Recent Results (from the past 240 hour(s))  CULTURE, BLOOD (ROUTINE X 2)     Status: None   Collection Time    07/10/14  4:10 PM  Result Value Ref Range Status   Specimen Description BLOOD LEFT ARM   Final   Special Requests BOTTLES DRAWN AEROBIC AND ANAEROBIC [facility]   Final   Culture  Setup Time     Final   Value: 07/11/2014 00:56     Performed at [facility]   Culture     Final   Value:        BLOOD CULTURE RECEIVED NO GROWTH TO DATE CULTURE WILL BE HELD FOR 5 DAYS BEFORE ISSUING A FINAL NEGATIVE REPORT     Performed at [facility]   Report Status PENDING   Incomplete  URINE CULTURE     Status: None   Collection Time    07/10/14  4:31 PM      Result Value Ref Range Status   Specimen Description URINE, CLEAN CATCH   Final    Special Requests NONE   Final   Culture  Setup Time     Final   Value: 07/11/2014 02:10     Performed at [facility] Count     Final   Value: >=100,000 COLONIES/ML     Performed at [facility]   Culture     Final   Value: Multiple bacterial morphotypes present, none predominant. Suggest appropriate recollection if clinically indicated.     Performed at [facility]   Report Status 07/12/2014 FINAL   Final  CULTURE, BLOOD (ROUTINE X 2)     Status: None   Collection Time    07/10/14  4:55 PM      Result Value Ref Range Status   Specimen Description BLOOD RIGHT HAND   Final   Special Requests BOTTLES DRAWN AEROBIC ONLY 10CC   Final   Culture  Setup Time     Final   Value: 07/11/2014 00:56     Performed at [facility]   Culture     Final   Value:        BLOOD CULTURE RECEIVED NO GROWTH TO DATE CULTURE WILL BE HELD FOR 5 DAYS BEFORE ISSUING A FINAL NEGATIVE REPORT     Performed at [facility]   Report Status PENDING   Incomplete  URINE CULTURE     Status: None   Collection Time    07/10/14  7:55 PM      Result Value Ref Range Status   Specimen Description URINE, RANDOM   Final   Special Requests NONE   Final   Culture  Setup Time     Final   Value: 07/11/2014 02:08     Performed at [facility]     Final   Value: NO GROWTH     Performed at [facility]   Culture     Final   Value: NO GROWTH     Performed at [facility]   Report Status 07/12/2014 FINAL   Final  CULTURE, RESPIRATORY (NON-EXPECTORATED)     Status: None   Collection Time    07/10/14  8:12 PM      Result Value Ref Range Status   Specimen Description TRACHEAL ASPIRATE   Final   Special Requests NONE   Final   Gram Stain     Final   Value: NO WBC SEEN     NO SQUAMOUS EPITHELIAL CELLS SEEN     RARE GRAM POSITIVE COCCI     IN PAIRS     Performed at [facility]   Culture     Final   Value: Non-Pathogenic  Oropharyngeal-type Flora Isolated.     Performed at [facility]   Report Status PENDING   Incomplete  CULTURE, BLOOD (ROUTINE X 2)     Status: None   Collection Time    07/10/14  9:04 PM      Result Value Ref Range Status   Specimen Description BLOOD RIGHT ARM   Final   Special Requests BOTTLES DRAWN AEROBIC AND ANAEROBIC [facility] EACH   Final   Culture  Setup Time     Final   Value: 07/11/2014 08:51     Performed at [facility]   Culture     Final   Value:        BLOOD CULTURE RECEIVED NO GROWTH TO DATE CULTURE WILL BE HELD FOR 5 DAYS BEFORE ISSUING A FINAL NEGATIVE REPORT     Performed at [facility]   Report Status PENDING   Incomplete  MRSA PCR SCREENING     Status: Abnormal   Collection Time    07/10/14 10:16 PM      Result Value Ref Range Status   MRSA by PCR POSITIVE (*) NEGATIVE Final   Comment:            The GeneXpert MRSA Assay (FDA     approved for NASAL specimens     only), is one component of a     comprehensive MRSA colonization     surveillance program. It is not     intended to diagnose MRSA     infection nor to guide or     monitor treatment for     MRSA infections.     RESULT CALLED TO, READ BACK BY AND VERIFIED WITH:     SPERRY,A RN [id number] AT [id number], [facility] [email or web address] [phone] pager  [phone] cell 07/12/2014, 1:32 PM

## 2014-07-12 NOTE — Progress Notes (Signed)
PULMONARY / CRITICAL CARE MEDICINE HISTORY AND PHYSICAL EXAMINATION   Name: Joshua [name] MRN: [id number] DOB: 24-Jun-1951    ADMISSION DATE:  07/10/2014  PRIMARY SERVICE: PCCM  CHIEF COMPLAINT:  Altered Mental Status  BRIEF PATIENT DESCRIPTION: 63 y/o M w/ complex history of DM, c/b diabetic nephropathy with nephrotic proteinuria, chronic non-healing diabetic foot ulcer, hx of cardiac arrest in March '15 (unknown cause) who presents w/ fever, hypoglycemia, AKI, and gap metabolic acidosis.  SIGNIFICANT EVENTS / STUDIES:  8/16 Intubated in ED 8/16 CT head>>>neg 8/16 CT left lower ext>>>Diffuse cellulitis most notable surrounding the great toe.<BR> <BR>Soft tissue abscess along the dorsal and medial aspect of the<BR>proximal phalanx of the great toe measuring a maximum of 3.2 x 2.0<BR>cm.<BR>  LINES / TUBES: ETT, 7.5 mm -- 8/16 >> Foley -- 8/16 >> PIV x2 - 8/16 >> OGT -- 8/16 >>  CULTURES: Blood x2 -- 8/16 (AFTER Abx dosed by ED)>> Sputum -- 8/16 >> Gram stain: rare Gram positive Cocci Urine -- 8/16 >> NG MRSA 8/16 >> Positive  ANTIBIOTICS: Vanc -- 8/16 >> Zosyn-- 8/16 >>  SUBJECTIVE: not on pressors, glu better, ortho evaluated  VITAL SIGNS: Temp:  [97.8 F (36.6 C)-99.5 F (37.5 C)] 99.2 F (37.3 C) (08/18 0700) Pulse Rate:  [43-104] 70 (08/18 0700) Resp:  [12-32] 24 (08/18 0700) BP: (93-174)/(45-110) 116/63 mmHg (08/18 0735) SpO2:  [100 %] 100 % (08/18 0700) FiO2 (%):  [30 %-40 %] 30 % (08/18 0735) Weight:  [141 lb 5 oz (64.1 kg)] 141 lb 5 oz (64.1 kg) (08/18 0433) HEMODYNAMICS:   VENTILATOR SETTINGS: Vent Mode:  [-] PRVC FiO2 (%):  [30 %-40 %] 30 % Set Rate:  [24 bmp] 24 bmp Vt Set:  [370 mL] 370 mL PEEP:  [5 cmH20] 5 cmH20 Plateau Pressure:  [11 cmH20-22 cmH20] 15 cmH20 INTAKE / OUTPUT: Intake/Output     08/17 0701 - 08/18 0700 08/18 0701 - 08/19 0700   I.V. (mL/kg) 551.9 (8.6)    NG/GT 1130    IV Piggyback 350    Total Intake(mL/kg) 2031.9 (31.7)     Urine (mL/kg/hr) 1360 (0.9)    Total Output 1360     Net +671.9            PHYSICAL EXAMINATION: General:  Thin black man, chronically ill-appearing Neuro:  Sedated rass -1 to -2 HEENT:  ETT/ OG No adenopathy, no JVD Cardiovascular: s1 s2 reg regular, no M/R/G Lungs:  CTA Abdomen:  Soft, non-distended. No r/g Musculoskeletal:  Bilateral LE discoloration, L foot with ulcer and right foot ulcer, oozing, swollen and foul smelling.  Skin:  Ruptured bullae (appear chronic) over B lower extremities, R worse than L.  LABS:  CBC  Recent Labs Lab 07/10/14 2109 07/11/14 0537 07/12/14 0239  WBC 17.3* 18.9* 12.9*  HGB 10.1* 8.1* 7.8*  HCT 31.7* 25.4* 24.2*  PLT 321 375 446*   Coag's  Recent Labs Lab 07/10/14 2109  APTT 30  INR 1.29   BMET  Recent Labs Lab 07/10/14 2109 07/11/14 0537 07/12/14 0239  NA 141 143 146  K 4.3 4.1 4.1  CL 106 108 112  CO2 '19 21 23  ' BUN 37* 35* 40*  CREATININE 1.44* 1.39* 1.59*  GLUCOSE 119* 96 133*   Electrolytes  Recent Labs Lab 07/10/14 2109 07/11/14 0537 07/12/14 0239  CALCIUM 8.5 9.0 8.6   Sepsis Markers  Recent Labs Lab 07/10/14 1620 07/10/14 1915 07/10/14 2109 07/10/14 2255 07/11/14 0537 07/12/14 0239  LATICACIDVEN 1.61 1.19  1.3  --   --   --   PROCALCITON  --   --   --  0.44 0.47 0.51   ABG  Recent Labs Lab 07/10/14 2100 07/12/14 0524  PHART 7.304* 7.357  PCO2ART 43.2 44.4  PO2ART 67.0* 129.0*   Liver Enzymes  Recent Labs Lab 07/10/14 1610 07/10/14 2109 07/12/14 0239  AST 44* 34 24  ALT 45 33 26  ALKPHOS 242* 237* 161*  BILITOT 0.7 0.7 0.5  ALBUMIN 2.8* 2.2* 1.8*   Cardiac Enzymes  Recent Labs Lab 07/10/14 2255 07/11/14 0537 07/11/14 1037  TROPONINI 0.49* 0.44* 0.33*   Glucose  Recent Labs Lab 07/11/14 1033 07/11/14 1303 07/11/14 1549 07/11/14 1953 07/12/14 0016 07/12/14 0349  GLUCAP 104* 95 126* 137* 129* 154*    Imaging Ct Head Wo Contrast  07/10/2014   CLINICAL DATA:   Severe headache.  EXAM: CT HEAD WITHOUT CONTRAST  TECHNIQUE: Contiguous axial images were obtained from the base of the skull through the vertex without intravenous contrast.  COMPARISON:  01/30/2014  FINDINGS: Skull and Sinuses:Nasopharyngeal fluid layer related to intubation. Inflammatory mucosal thickening in the paranasal sinuses without definitive acute sinusitis. There is diffuse reticulation of the scalp. No acute calvarial findings.  Orbits: No acute abnormality.  Brain: No evidence of acute abnormality, such as acute infarction, hemorrhage, hydrocephalus, or mass lesion/mass effect. There is mild generalized volume loss which is age appropriate. Stable pattern of mild chronic small vessel disease, with ischemic gliosis best seen around the lateral ventricles.  IMPRESSION: 1. No evidence of acute intracranial disease. 2. Mild chronic small vessel disease.   Electronically Signed   By: Jorje Guild M.D.   On: 07/10/2014 22:16   Ct Foot Left W Contrast  07/10/2014   CLINICAL DATA:  Diffuse soft tissue swelling and open wound on the plantar aspect of the forefoot.  EXAM: CT OF THE LEFT FOOT WITH CONTRAST  TECHNIQUE: Multidetector CT imaging was performed following the standard protocol during bolus administration of intravenous contrast.  CONTRAST:  119m OMNIPAQUE IOHEXOL 300 MG/ML  SOLN  COMPARISON:  Radiographs 07/10/2014.  FINDINGS: There is diffuse subcutaneous soft tissue swelling/ edema involving the entire foot consistent with cellulitis. This is most significant surrounding the great toe. There is an open wound on the plantar aspect of the forefoot near the level of the first metatarsal phalangeal joint. No obvious findings for septic arthritis or osteomyelitis but MRI would be much more sensitive.  There is an abscess along the dorsal and medial aspect of the proximal phalanx of the great toe measuring a maximum of 3.2 x 2.0 cm. A small amount of air is noted.  The remaining bony structures are  intact.  IMPRESSION: Diffuse cellulitis most notable surrounding the great toe.  Soft tissue abscess along the dorsal and medial aspect of the proximal phalanx of the great toe measuring a maximum of 3.2 x 2.0 cm.  No definite CT findings for septic arthritis or osteomyelitis.   Electronically Signed   By: MKalman JewelsM.D.   On: 07/10/2014 22:18   Dg Chest Port 1 View  07/12/2014   CLINICAL DATA:  Check endotracheal tube.  EXAM: PORTABLE CHEST - 1 VIEW  COMPARISON:  07/10/2014  FINDINGS: The endotracheal tube is approximately 2.5 cm above the carina. Stable elevation of the right hemidiaphragm. Slightly improved aeration at the left lung base with residual basilar densities. Heart size is stable. No evidence for a pneumothorax. Nasogastric tube extends into the abdomen but the  tip is beyond the image. Mildly improved aeration at the right lung base.  IMPRESSION: Improving aeration at both lung bases.  Support apparatuses as described.   Electronically Signed   By: Markus Daft M.D.   On: 07/12/2014 07:44   Dg Chest Portable 1 View  07/10/2014   CLINICAL DATA:  Status post intubation.  EXAM: PORTABLE CHEST - 1 VIEW  COMPARISON:  Single view of the chest 07/10/2014 at 1611 hr.  FINDINGS: Endotracheal tube is in place with tip in good position at the level of the clavicular heads. Left greater right basilar airspace disease is again seen. Opacity in the right lung base has an appearance most compatible with atelectasis. There is no pneumothorax. There may be a small left pleural effusion.  IMPRESSION: ET tube in good position.  Left worse than right basilar airspace disease. Opacity in the left base could be due to atelectasis or pneumonia. Right basilar opacity has an appearance most consistent with atelectasis.   Electronically Signed   By: Inge Rise M.D.   On: 07/10/2014 19:53   Dg Chest Port 1 View  (if Code Sepsis Called)  07/10/2014   CLINICAL DATA:  Sepsis.  Altered mental status.  EXAM:  PORTABLE CHEST - 1 VIEW  COMPARISON:  CT chest 01/30/2014.  PA and lateral chest 04/13/2014.  FINDINGS: Elevation of the right hemidiaphragm is again seen. There is bibasilar airspace disease. There is also likely a small left pleural effusion. No pneumothorax identified. Heart size is upper normal.  IMPRESSION: Bibasilar airspace disease does not appear markedly changed and is likely due to chronic atelectasis or pneumonia.  Small left pleural effusion.   Electronically Signed   By: Inge Rise M.D.   On: 07/10/2014 16:28   Dg Foot 2 Views Left  07/10/2014   CLINICAL DATA:  Diabetic foot ulcer.  EXAM: LEFT FOOT - 2 VIEW  COMPARISON:  MRI 01/09/2014.  FINDINGS: There is a small ulcer on the plantar aspect of the forefoot overlying the region of the first metatarsal phalangeal joint. There is diffuse soft tissue swelling. No definite destructive bony changes to suggest osteomyelitis.  IMPRESSION: Marked diffuse soft tissue swelling involving the forefoot, particularly surrounding the great toe.  Open wound on the plantar aspect of the first metatarsal phalangeal joint.  No definite destructive bony changes to suggest osteomyelitis.   Electronically Signed   By: Kalman Jewels M.D.   On: 07/10/2014 18:11    EKG:  Non obtained CXR: Reviewed, aeration improving of lung bases bilaterally, ETT and OG in good position.  ASSESSMENT / PLAN:  Active Problems:   Severe sepsis with acute organ dysfunction  PULMONARY A: Hypoxic and Hypercapnic Respiratory Failure Pulmonary Edema vs ALI History of positive PPD, s/p treatment by [facility] therapy (AFB cx negative) P:   - Consider sbt 30 min , cpap 5 p5 goal, assess rsbi, ABG - CXR in am -keep plat less 30 -need to reduce sedation  CARDIOVASCULAR A: Hypertension History of cardiac arrest Heart Failure with Preserved EF R/o demand ishcemia P:   -Hydralazine PRN for HTN -maintain oral hydralazine -Tele  RENAL A: CKD with nephrotic  syndrome AG metabolic acidosis- resolved hyperchloremia P:   -Chem in am  -Allow pos balance -change fluids to 1/2 NS  GASTROINTESTINAL A: History of C. Diff No diarrhea noted Hypoalbuminemia P:   - TF, hold if weaning well -dc cdiff - Pepcid - Alk Phos trending down  HEMATOLOGIC A: Leukocysosis Anemia, chronic, dilutional dvt  prevention P:   - Sub q hep - Check serial CBCs in am again with some drop - Hold iron in setting of sepsis, may cause harm  INFECTIOUS A: Severe sepsis due to diabetic foot ulcer infection / abscess-  Osteomyelitis possible MRSA positive P: - Continued vanc, zosyn - MRI of Left foot abscess  -ID consult - Culture abscess  ENDOCRINE A: DM2 P:   - Q4h cbg  NEUROLOGIC A: Metabolic Encephalopathy Possible Seizure P:   - Prn fent and versed with wua - keep Propofol and follow triglyceridemia  TODAY'S SUMMARY:  Ortho evaluation did not include culture of wounds, but plan suggests MRI of L foot with the concern for osteomyelitis. wean  Barron Alvine PA-s  I have personally obtained a history, examined the patient, evaluated laboratory and imaging results, formulated the assessment and plan and placed orders.  CRITICAL CARE: The patient is critically ill with multiple organ systems failure and requires high complexity decision making for assessment and support, frequent evaluation and titration of therapies, application of advanced monitoring technologies and extensive interpretation of multiple databases. Critical Care Time devoted to patient care services described in this note is 30 minutes.   Lavon Paganini. Titus Mould, MD, [place] Pgr: [facility]

## 2014-07-12 NOTE — Procedures (Signed)
Extubation Procedure Note  Patient Details:   Name: Joshua [name] DOB: 29-Jun-1951 MRN: [id number]   Airway Documentation:  Airway 7.5 mm (Active)  Secured at (cm) 24 cm 07/12/2014 11:16 AM  Measured From Lips 07/12/2014 11:16 AM  [place] 07/12/2014 11:16 AM  Secured By [employer] 07/12/2014 11:16 AM  Tube Holder Repositioned Yes 07/12/2014 11:16 AM  Site Condition Other (Comment) 07/12/2014 11:16 AM    Evaluation  O2 sats: stable throughout Complications: No apparent complications Patient did tolerate procedure well. Bilateral Breath Sounds: Diminished;Clear Suctioning: Airway Yes  Patient extubated per MD order at this time to 4 LNC. Patient able to vocalize and clear secretions. RT will continue to monitor.  Saunders Glance 07/12/2014, 11:36 AM

## 2014-07-13 ENCOUNTER — Inpatient Hospital Stay (HOSPITAL_COMMUNITY): Payer: Self-pay

## 2014-07-13 ENCOUNTER — Other Ambulatory Visit: Payer: Self-pay | Admitting: Physician Assistant

## 2014-07-13 DIAGNOSIS — E872 Acidosis: Secondary | ICD-10-CM | POA: Diagnosis present

## 2014-07-13 DIAGNOSIS — J9602 Acute respiratory failure with hypercapnia: Secondary | ICD-10-CM | POA: Diagnosis present

## 2014-07-13 DIAGNOSIS — M869 Osteomyelitis, unspecified: Secondary | ICD-10-CM | POA: Diagnosis present

## 2014-07-13 DIAGNOSIS — E8729 Other acidosis: Secondary | ICD-10-CM | POA: Diagnosis present

## 2014-07-13 LAB — BASIC METABOLIC PANEL
Anion gap: 21 — ABNORMAL HIGH (ref 5–15)
BUN: 35 mg/dL — ABNORMAL HIGH (ref 6–23)
CO2: 17 mEq/L — ABNORMAL LOW (ref 19–32)
CREATININE: 1.49 mg/dL — AB (ref 0.50–1.35)
Calcium: 8.9 mg/dL (ref 8.4–10.5)
Chloride: 111 mEq/L (ref 96–112)
GFR calc Af Amer: 56 mL/min — ABNORMAL LOW (ref >90)
GFR calc non Af Amer: 49 mL/min — ABNORMAL LOW (ref >90)
GLUCOSE: 90 mg/dL (ref 70–99)
POTASSIUM: 3.8 meq/L (ref 3.7–5.3)
Sodium: 149 mEq/L — ABNORMAL HIGH (ref 137–147)

## 2014-07-13 LAB — GLUCOSE, CAPILLARY
Glucose-Capillary: 104 mg/dL — ABNORMAL HIGH (ref 70–99)
Glucose-Capillary: 112 mg/dL — ABNORMAL HIGH (ref 70–99)
Glucose-Capillary: 117 mg/dL — ABNORMAL HIGH (ref 70–99)
Glucose-Capillary: 150 mg/dL — ABNORMAL HIGH (ref 70–99)
Glucose-Capillary: 165 mg/dL — ABNORMAL HIGH (ref 70–99)
Glucose-Capillary: 84 mg/dL (ref 70–99)

## 2014-07-13 LAB — CBC
HCT: 26.2 % — ABNORMAL LOW (ref 39.0–52.0)
HEMOGLOBIN: 8.4 g/dL — AB (ref 13.0–17.0)
MCH: 26.9 pg (ref 26.0–34.0)
MCHC: 32.1 g/dL (ref 30.0–36.0)
MCV: 84 fL (ref 78.0–100.0)
Platelets: 569 10*3/uL — ABNORMAL HIGH (ref 150–400)
RBC: 3.12 MIL/uL — AB (ref 4.22–5.81)
RDW: 16.5 % — ABNORMAL HIGH (ref 11.5–15.5)
WBC: 16.5 10*3/uL — ABNORMAL HIGH (ref 4.0–10.5)

## 2014-07-13 LAB — CULTURE, RESPIRATORY W GRAM STAIN: Gram Stain: NONE SEEN

## 2014-07-13 LAB — CULTURE, RESPIRATORY

## 2014-07-13 LAB — VANCOMYCIN, TROUGH: Vancomycin Tr: 18.2 ug/mL (ref 10.0–20.0)

## 2014-07-13 MED ORDER — SODIUM CHLORIDE 0.9 % IV SOLN: INTRAVENOUS | Status: DC

## 2014-07-13 MED ORDER — SODIUM BICARBONATE 8.4 % IV SOLN
INTRAVENOUS | Status: DC
  Administered 2014-07-13: 13:00:00 via INTRAVENOUS
  Filled 2014-07-13 (×3): qty 150

## 2014-07-13 MED ORDER — ASPIRIN EC 81 MG PO TBEC
81.0000 mg | DELAYED_RELEASE_TABLET | Freq: Every day | ORAL | Status: DC
  Filled 2014-07-13: qty 1

## 2014-07-13 MED ORDER — ACETAMINOPHEN 500 MG PO TABS
1000.0000 mg | ORAL_TABLET | Freq: Once | ORAL | Status: DC
Start: 2014-07-13 — End: 2014-07-14

## 2014-07-13 MED ORDER — HYDRALAZINE HCL 20 MG/ML IJ SOLN
20.0000 mg | INTRAMUSCULAR | Status: DC | PRN
  Administered 2014-07-13 – 2014-07-14 (×3): 20 mg via INTRAVENOUS
  Filled 2014-07-13 (×3): qty 1

## 2014-07-13 MED ORDER — FAMOTIDINE IN NACL 20-0.9 MG/50ML-% IV SOLN
20.0000 mg | Freq: Two times a day (BID) | INTRAVENOUS | Status: DC
  Administered 2014-07-13 – 2014-07-14 (×4): 20 mg via INTRAVENOUS
  Filled 2014-07-13 (×8): qty 50

## 2014-07-13 MED ORDER — CHLORHEXIDINE GLUCONATE 4 % EX LIQD
60.0000 mL | Freq: Once | CUTANEOUS | Status: AC
  Administered 2014-07-14: 4 via TOPICAL
  Filled 2014-07-13 (×2): qty 60

## 2014-07-13 MED ORDER — SODIUM CHLORIDE 0.9 % IV SOLN
INTRAVENOUS | Status: DC
  Administered 2014-07-14: 01:00:00 via INTRAVENOUS

## 2014-07-13 NOTE — Progress Notes (Signed)
I received a call about transfer of care of Joshua [name] from CCU PA and also discussed with Dr. Titus Mould. IMTS will resume care in AM.   Signed: Jerene Pitch, MD PGY-3, Internal Medicine Resident Pager: [phone]  07/13/2014,10:28 PM

## 2014-07-13 NOTE — Progress Notes (Signed)
ANTIBIOTIC CONSULT NOTE - INITIAL  Pharmacy Consult for Vancomycin, Zosyn Indication: Sepsis/DM foot infection/osteomyelitis  No Active Allergies  Patient Measurements: Height: 5' 4.96" (165 cm) Weight: 137 lb 2 oz (62.2 kg) IBW/kg (Calculated) : 61.41  Vital Signs: Temp: 98 F (36.7 C) (08/19 1447) Temp src: Oral (08/19 1447) BP: 175/86 mmHg (08/19 1447) Pulse Rate: 100 (08/19 1447) Intake/Output from previous day: 08/18 0701 - 08/19 0700 In: 1518.7 [I.V.:918.7; NG/GT:250; IV Piggyback:350] Out: [id number] [Urine:1215] Intake/Output from this shift: Total I/O In: 346.3 [I.V.:246.3; IV Piggyback:100] Out: 460 [Urine:460]  Labs:  Recent Labs  07/11/14 0537 07/12/14 0239 07/13/14 0245  WBC 18.9* 12.9* 16.5*  HGB 8.1* 7.8* 8.4*  PLT 375 446* 569*  CREATININE 1.39* 1.59* 1.49*   Estimated Creatinine Clearance: 44.6 ml/min (by C-G formula based on Cr of 1.49).  Recent Labs  07/13/14 0245  VANCOTROUGH 18.2     Microbiology: Recent Results (from the past 720 hour(s))  CULTURE, BLOOD (ROUTINE X 2)     Status: None   Collection Time    07/10/14  4:10 PM      Result Value Ref Range Status   Specimen Description BLOOD LEFT ARM   Final   Special Requests BOTTLES DRAWN AEROBIC AND ANAEROBIC [facility]   Final   Culture  Setup Time     Final   Value: 07/11/2014 00:56     Performed at [facility]   Culture     Final   Value:        BLOOD CULTURE RECEIVED NO GROWTH TO DATE CULTURE WILL BE HELD FOR 5 DAYS BEFORE ISSUING A FINAL NEGATIVE REPORT     Performed at [facility]   Report Status PENDING   Incomplete  URINE CULTURE     Status: None   Collection Time    07/10/14  4:31 PM      Result Value Ref Range Status   Specimen Description URINE, CLEAN CATCH   Final   Special Requests NONE   Final   Culture  Setup Time     Final   Value: 07/11/2014 02:10     Performed at [place]     Final   Value: >=100,000 COLONIES/ML     Performed  at [facility]   Culture     Final   Value: Multiple bacterial morphotypes present, none predominant. Suggest appropriate recollection if clinically indicated.     Performed at [facility]   Report Status 07/12/2014 FINAL   Final  CULTURE, BLOOD (ROUTINE X 2)     Status: None   Collection Time    07/10/14  4:55 PM      Result Value Ref Range Status   Specimen Description BLOOD RIGHT HAND   Final   Special Requests BOTTLES DRAWN AEROBIC ONLY 10CC   Final   Culture  Setup Time     Final   Value: 07/11/2014 00:56     Performed at [facility]   Culture     Final   Value:        BLOOD CULTURE RECEIVED NO GROWTH TO DATE CULTURE WILL BE HELD FOR 5 DAYS BEFORE ISSUING A FINAL NEGATIVE REPORT     Performed at [facility]   Report Status PENDING   Incomplete  URINE CULTURE     Status: None   Collection Time    07/10/14  7:55 PM      Result Value Ref  Range Status   Specimen Description URINE, RANDOM   Final   Special Requests NONE   Final   Culture  Setup Time     Final   Value: 07/11/2014 02:08     Performed at [place]     Final   Value: NO GROWTH     Performed at [facility]   Culture     Final   Value: NO GROWTH     Performed at [facility]   Report Status 07/12/2014 FINAL   Final  CULTURE, RESPIRATORY (NON-EXPECTORATED)     Status: None   Collection Time    07/10/14  8:12 PM      Result Value Ref Range Status   Specimen Description TRACHEAL ASPIRATE   Final   Special Requests NONE   Final   Gram Stain     Final   Value: NO WBC SEEN     NO SQUAMOUS EPITHELIAL CELLS SEEN     RARE GRAM POSITIVE COCCI     IN PAIRS     Performed at [facility]   Culture     Final   Value: Non-Pathogenic Oropharyngeal-type Flora Isolated.     Performed at [facility]   Report Status 07/13/2014 FINAL   Final  CULTURE, BLOOD (ROUTINE X 2)     Status: None   Collection Time    07/10/14  9:04 PM       Result Value Ref Range Status   Specimen Description BLOOD RIGHT ARM   Final   Special Requests BOTTLES DRAWN AEROBIC AND ANAEROBIC [facility] EACH   Final   Culture  Setup Time     Final   Value: 07/11/2014 08:51     Performed at [facility]   Culture     Final   Value:        BLOOD CULTURE RECEIVED NO GROWTH TO DATE CULTURE WILL BE HELD FOR 5 DAYS BEFORE ISSUING A FINAL NEGATIVE REPORT     Performed at [facility]   Report Status PENDING   Incomplete  MRSA PCR SCREENING     Status: Abnormal   Collection Time    07/10/14 10:16 PM      Result Value Ref Range Status   MRSA by PCR POSITIVE (*) NEGATIVE Final   Comment:            The GeneXpert MRSA Assay (FDA     approved for NASAL specimens     only), is one component of a     comprehensive MRSA colonization     surveillance program. It is not     intended to diagnose MRSA     infection nor to guide or     monitor treatment for     MRSA infections.     RESULT CALLED TO, READ BACK BY AND VERIFIED WITH:     SPERRY,A RN [id number] AT 0102 SKEEN,P  CULTURE, ROUTINE-ABSCESS     Status: None   Collection Time    07/12/14  2:41 PM      Result Value Ref Range Status   Specimen Description ABSCESS LEG   Final   Special Requests NONE   Final   Gram Stain     Final   Value: RARE WBC PRESENT, PREDOMINANTLY PMN     NO SQUAMOUS EPITHELIAL CELLS SEEN     FEW GRAM POSITIVE COCCI     IN PAIRS  Performed at [facility]     Final   Value: Culture reincubated for better growth     Performed at [facility]   Report Status PENDING   Incomplete    Medical History: Past Medical History  Diagnosis Date  . 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 Hypertension 10/14/2012  . Type 2 diabetes mellitus   . Diabetic nephropathy with proteinuria   . 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 Diastolic heart failure   . Cellulitis   . Convulsions   . Iron deficiency anemia   . Venous  thrombosis and embolism FEB 2015    ACUTE DVTs, bilateral PERONEAL   . Anasarca   . Hypoalbuminemia   . Elevated alkaline phosphatase level   . Microcytic anemia   . GI bleed   . Nephrotic syndrome   . Difficulty in walking(719.7)   . Muscle weakness (generalized)   . Type II or unspecified type diabetes mellitus with renal manifestations, not stated as uncontrolled   . Iron deficiency anemia, unspecified     Medications:  Prescriptions prior to admission  Medication Sig Dispense Refill  . acetaminophen (TYLENOL) 325 MG tablet Take 325 mg by mouth every 4 (four) hours as needed for mild pain, moderate pain or fever.       Marland Kitchen aspirin EC 81 MG tablet Take 81 mg by mouth daily.      . cloNIDine (CATAPRES) 0.1 MG tablet Take 1 tablet (0.1 mg total) by mouth 3 (three) times daily.  60 tablet  11  . ferrous sulfate 325 (65 FE) MG tablet Take 1 tablet (325 mg total) by mouth 3 (three) times daily with meals.  90 tablet  1  . furosemide (LASIX) 20 MG tablet Take 1 tablet (20 mg total) by mouth daily.  30 tablet  0  . glimepiride (AMARYL) 1 MG tablet Take 1 tablet (1 mg total) by mouth daily with breakfast.  30 tablet  1  . hydrALAZINE (APRESOLINE) 50 MG tablet Take 1 tablet (50 mg total) by mouth every 8 (eight) hours.  90 tablet  2  . isosorbide mononitrate (IMDUR) 30 MG 24 hr tablet Take 1 tablet (30 mg total) by mouth daily.  30 tablet  2   Assessment: 57 YOM with sepsis secondary to recurrent diabetic foot infection, now with osteomyelitis. Pharmacy consulted to dose Vancomycin and Zosyn. VT 8/19 therapeutic at 18.2. Trough was drawn roughly 2 hours early, kinetic show true trough to still be therapeutic at 16.7. Acute on chronic kidney injury, SCr stable @ 1.49. UOP good at 0.54mL/kg/hr. WBC up today to 16.5 but has been trending down since admission.   Goal of Therapy:  Vancomycin trough level 15-20 mcg/ml  Plan:  - Continue  Vanc 500 mg IV Q 12 hours  - Continue Zosyn 3.375 gm IV Q 8  hours  - Monitor CBC, renal fx, cultures and clinical progress - OR 8/20 for amputation - F/u post surgery for abx plan  Thank you for allowing pharmacy to be part of this patient's care team  [employer], Pharm.D Clinical Pharmacy Resident Pager: [phone] 07/13/2014 .3:10 PM

## 2014-07-13 NOTE — Progress Notes (Signed)
Subjective: Pt. Reports feeling "okay", although he is quite somnolent and has difficulty staying awake. He does not have any new complaints at this time. History is limited due to patient status.    Objective: Vital signs in last 24 hours: Temp:  [98.8 F (37.1 C)-100.3 F (37.9 C)] 99 F (37.2 C) (08/19 0345) Pulse Rate:  [29-118] 109 (08/19 0700) Resp:  [17-45] 21 (08/19 0700) BP: (121-247)/(57-180) 172/91 mmHg (08/19 0700) SpO2:  [86 %-100 %] 98 % (08/19 0700) FiO2 (%):  [30 %] 30 % (08/18 1117) Weight:  [62.2 kg (137 lb 2 oz)] 62.2 kg (137 lb 2 oz) (08/19 0439)  Intake/Output from previous day: 08/18 0701 - 08/19 0700 In: 1518.7 [I.V.:918.7; NG/GT:250; IV Piggyback:350] Out: [id number] [Urine:1215] Intake/Output this shift:     Recent Labs  07/10/14 1610 07/10/14 2109 07/11/14 0537 07/12/14 0239 07/13/14 0245  HGB 10.2* 10.1* 8.1* 7.8* 8.4*    Recent Labs  07/12/14 0239 07/13/14 0245  WBC 12.9* 16.5*  RBC 2.90* 3.12*  HCT 24.2* 26.2*  PLT 446* 569*    Recent Labs  07/12/14 0239 07/13/14 0245  NA 146 149*  K 4.1 3.8  CL 112 111  CO2 23 17*  BUN 40* 35*  CREATININE 1.59* 1.49*  GLUCOSE 133* 90  CALCIUM 8.6 8.9    Recent Labs  07/10/14 2109  INR 1.29   Radiology: MRI Foot L Wo Contrast 07/12/14 IMPRESSION:  1. Osteomyelitis involving the first digit sesamoids and the  proximal and distal phalanges of the first digit.  2. Plantar ulceration below the first digit sesamoids, with soft  tissue infection in this vicinity which appears to drain to the skin  rather than forming a walled off pocket.  3. A second fluid collection along the dorsal -lateral base of the  great toe likewise appears to drain to the skin in the vicinity of  the dorsal web space.  4. Fasciitis along the plantar musculature of the foot.  Electronically Signed  By: Sherryl Barters M.D.  On: 07/12/2014 20:14    Physical Exam: Pt is a WD/WN male. Somnolent and slow to respond,  but with GCS 15. Mood and Affect appropriate. A and O to person, place, time. EOMI. Respirations normal and unlabored, with nasal canula. Foley catheter in place. Left forefoot swelling with superficial blistering and drainage or purulent material. No obvious deformity or ecchymoses. NV intact with movement of great and lesser toes, brisk capillary refill. Malodorous foot. No obvious streaking of foot or leg. 5/5 strength of ankle PF/DF.    Assessment/Plan: Left foot osteomyelitis- will review MRI results with Dr. Doran Durand to develop operative plan. May plan for OR on Thursday, 07/14/14, for definitive amputation. Continue on IV ABX and other treatments per primary treatment team.    Joshua [name] 07/13/2014, 7:57 AM

## 2014-07-13 NOTE — Progress Notes (Signed)
[name] for Infectious Disease  Date of Admission:  07/10/2014  Antibiotics: vanconmycin and zosyn  Subjective: thirsty  Objective: Temp:  [97.7 F (36.5 C)-100.3 F (37.9 C)] 97.7 F (36.5 C) (08/19 0839) Pulse Rate:  [29-118] 110 (08/19 1000) Resp:  [17-45] 17 (08/19 1000) BP: (140-247)/(75-180) 179/87 mmHg (08/19 1000) SpO2:  [86 %-100 %] 98 % (08/19 1000) Weight:  [137 lb 2 oz (62.2 kg)] 137 lb 2 oz (62.2 kg) (08/19 0439)  General: awake, alert Skin: no rashes Lungs: CTA B Cor: RRR Abdomen: soft, nt, nd Ext: left foot with wound, no drainage noted  Lab Results Lab Results  Component Value Date   WBC 16.5* 07/13/2014   HGB 8.4* 07/13/2014   HCT 26.2* 07/13/2014   MCV 84.0 07/13/2014   PLT 569* 07/13/2014    Lab Results  Component Value Date   CREATININE 1.49* 07/13/2014   BUN 35* 07/13/2014   NA 149* 07/13/2014   K 3.8 07/13/2014   CL 111 07/13/2014   CO2 17* 07/13/2014    Lab Results  Component Value Date   ALT 26 07/12/2014   AST 24 07/12/2014   ALKPHOS 161* 07/12/2014   BILITOT 0.5 07/12/2014      Microbiology: Recent Results (from the past 240 hour(s))  CULTURE, BLOOD (ROUTINE X 2)     Status: None   Collection Time    07/10/14  4:10 PM      Result Value Ref Range Status   Specimen Description BLOOD LEFT ARM   Final   Special Requests BOTTLES DRAWN AEROBIC AND ANAEROBIC 6CC   Final   Culture  Setup Time     Final   Value: 07/11/2014 00:56     Performed at [facility]   Culture     Final   Value:        BLOOD CULTURE RECEIVED NO GROWTH TO DATE CULTURE WILL BE HELD FOR 5 DAYS BEFORE ISSUING A FINAL NEGATIVE REPORT     Performed at [facility]   Report Status PENDING   Incomplete  URINE CULTURE     Status: None   Collection Time    07/10/14  4:31 PM      Result Value Ref Range Status   Specimen Description URINE, CLEAN CATCH   Final   Special Requests NONE   Final   Culture  Setup Time     Final   Value: 07/11/2014 02:10     Performed at [place]     Final   Value: >=100,000 COLONIES/ML     Performed at [facility]   Culture     Final   Value: Multiple bacterial morphotypes present, none predominant. Suggest appropriate recollection if clinically indicated.     Performed at [facility]   Report Status 07/12/2014 FINAL   Final  CULTURE, BLOOD (ROUTINE X 2)     Status: None   Collection Time    07/10/14  4:55 PM      Result Value Ref Range Status   Specimen Description BLOOD RIGHT HAND   Final   Special Requests BOTTLES DRAWN AEROBIC ONLY 10CC   Final   Culture  Setup Time     Final   Value: 07/11/2014 00:56     Performed at [facility]   Culture     Final   Value:        BLOOD CULTURE RECEIVED NO GROWTH TO DATE CULTURE  WILL BE HELD FOR 5 DAYS BEFORE ISSUING A FINAL NEGATIVE REPORT     Performed at [facility]   Report Status PENDING   Incomplete  URINE CULTURE     Status: None   Collection Time    07/10/14  7:55 PM      Result Value Ref Range Status   Specimen Description URINE, RANDOM   Final   Special Requests NONE   Final   Culture  Setup Time     Final   Value: 07/11/2014 02:08     Performed at [place]     Final   Value: NO GROWTH     Performed at [facility]   Culture     Final   Value: NO GROWTH     Performed at [facility]   Report Status 07/12/2014 FINAL   Final  CULTURE, RESPIRATORY (NON-EXPECTORATED)     Status: None   Collection Time    07/10/14  8:12 PM      Result Value Ref Range Status   Specimen Description TRACHEAL ASPIRATE   Final   Special Requests NONE   Final   Gram Stain     Final   Value: NO WBC SEEN     NO SQUAMOUS EPITHELIAL CELLS SEEN     RARE GRAM POSITIVE COCCI     IN PAIRS     Performed at [facility]   Culture     Final   Value: Non-Pathogenic Oropharyngeal-type Flora Isolated.     Performed at [facility]   Report Status  07/13/2014 FINAL   Final  CULTURE, BLOOD (ROUTINE X 2)     Status: None   Collection Time    07/10/14  9:04 PM      Result Value Ref Range Status   Specimen Description BLOOD RIGHT ARM   Final   Special Requests BOTTLES DRAWN AEROBIC AND ANAEROBIC [facility] EACH   Final   Culture  Setup Time     Final   Value: 07/11/2014 08:51     Performed at [facility]   Culture     Final   Value:        BLOOD CULTURE RECEIVED NO GROWTH TO DATE CULTURE WILL BE HELD FOR 5 DAYS BEFORE ISSUING A FINAL NEGATIVE REPORT     Performed at [facility]   Report Status PENDING   Incomplete  MRSA PCR SCREENING     Status: Abnormal   Collection Time    07/10/14 10:16 PM      Result Value Ref Range Status   MRSA by PCR POSITIVE (*) NEGATIVE Final   Comment:            The GeneXpert MRSA Assay (FDA     approved for NASAL specimens     only), is one component of a     comprehensive MRSA colonization     surveillance program. It is not     intended to diagnose MRSA     infection nor to guide or     monitor treatment for     MRSA infections.     RESULT CALLED TO, READ BACK BY AND VERIFIED WITH:     SPERRY,A RN [id number] AT 0102 SKEEN,P  CULTURE, ROUTINE-ABSCESS     Status: None   Collection Time    07/12/14  2:41 PM      Result Value Ref Range Status   Specimen Description  ABSCESS LEG   Final   Special Requests NONE   Final   Gram Stain     Final   Value: RARE WBC PRESENT, PREDOMINANTLY PMN     NO SQUAMOUS EPITHELIAL CELLS SEEN     FEW GRAM POSITIVE COCCI     IN PAIRS     Performed at [facility]   Culture     Final   Value: Culture reincubated for better growth     Performed at [facility]   Report Status PENDING   Incomplete    Studies/Results: Mr Foot Left Wo Contrast  07/12/2014   CLINICAL DATA:  Sepsis. Ulcer of the great toe, query need for incision and drainage. Query osteomyelitis/abscess.  EXAM: MRI OF THE LEFT FOREFOOT WITHOUT CONTRAST  TECHNIQUE: Multiplanar,  multisequence MR imaging was performed. No intravenous contrast was administered.  COMPARISON:  07/10/2014 CT scan ; MRI from 01/09/2014  FINDINGS: There is abnormal marrow edema involving the proximal and distal phalanges of the great toe and also involving the first digit sesamoids. There is associated periostitis in the appearance is compatible with osteomyelitis.  There is extensive abnormal soft tissue infiltration just plantar to the sesamoids with would appears to be a draining sinus tract to the skin versus ulceration in this vicinity. This extends down to the level of the sesamoids. I suspect that there is likely infection in this vicinity draining to the skin, rather than a walled off abscess.  Along the dorsal -lateral base of the great toe, there is a fluid collection in the soft tissues which appears to drain to the skin along the web space region on image [id number] series 5, potentially a draining sinus tract.  Edema is present in the plantar musculature of the foot, favoring myositis.  No significant abnormal osseous edema is observed in the remainder the foot. There does appear to be some subcutaneous edema along the medial ankle.  IMPRESSION: 1. Osteomyelitis involving the first digit sesamoids and the proximal and distal phalanges of the first digit. 2. Plantar ulceration below the first digit sesamoids, with soft tissue infection in this vicinity which appears to drain to the skin rather than forming a walled off pocket. 3. A second fluid collection along the dorsal -lateral base of the great toe likewise appears to drain to the skin in the vicinity of the dorsal web space. 4. Fasciitis along the plantar musculature of the foot.   Electronically Signed   By: Sherryl Barters M.D.   On: 07/12/2014 20:14   Dg Chest Port 1 View  07/13/2014   CLINICAL DATA:  Shortness of breath  EXAM: PORTABLE CHEST - 1 VIEW  COMPARISON:  07/12/2014  FINDINGS: Interval tracheal and esophageal extubation. Pulmonary  inflation remains stable, with mild atelectatic changes at the bases. There is chronic elevation of the right diaphragm. Chronic cardiomegaly. Stable upper mediastinal contours. Suspect small left pleural effusion. No edema, consolidation, or pneumothorax.  IMPRESSION: Stable pulmonary inflation after extubation.   Electronically Signed   By: Jorje Guild M.D.   On: 07/13/2014 05:30   Dg Chest Port 1 View  07/12/2014   CLINICAL DATA:  Check endotracheal tube.  EXAM: PORTABLE CHEST - 1 VIEW  COMPARISON:  07/10/2014  FINDINGS: The endotracheal tube is approximately 2.5 cm above the carina. Stable elevation of the right hemidiaphragm. Slightly improved aeration at the left lung base with residual basilar densities. Heart size is stable. No evidence for a pneumothorax. Nasogastric tube extends into the abdomen  but the tip is beyond the image. Mildly improved aeration at the right lung base.  IMPRESSION: Improving aeration at both lung bases.  Support apparatuses as described.   Electronically Signed   By: Markus Daft M.D.   On: 07/12/2014 07:44    Assessment/Plan: 1)  Osteomyelitis - confirmed on MRI.  Amputation tomorrow.  If gross margins clear will treat for 2 weeks with oral antibiotics at discharge.   Will follow up again on Friday.    Scharlene Gloss, [facility] for Infectious Disease [place] [email or web address] [phone] pager   [phone] cell 07/13/2014, 11:39 AM

## 2014-07-13 NOTE — Progress Notes (Signed)
Subjective: Pt denies pain in the left foot.  No n/v.  On vanc and zosyn.  MRI done yesterday.   Objective: Vital signs in last 24 hours: Temp:  [97.7 F (36.5 C)-100.3 F (37.9 C)] 98.5 F (36.9 C) (08/19 1259) Pulse Rate:  [95-118] 109 (08/19 1200) Resp:  [17-45] 25 (08/19 1200) BP: (140-247)/(75-180) 157/85 mmHg (08/19 1200) SpO2:  [86 %-100 %] 94 % (08/19 1200) Weight:  [62.2 kg (137 lb 2 oz)] 62.2 kg (137 lb 2 oz) (08/19 0439)  Intake/Output from previous day: 08/18 0701 - 08/19 0700 In: 1518.7 [I.V.:918.7; NG/GT:250; IV Piggyback:350] Out: [id number] [Urine:1215] Intake/Output this shift: Total I/O In: 170 [I.V.:120; IV Piggyback:50] Out: 160 [Urine:160]   Recent Labs  07/10/14 1610 07/10/14 2109 07/11/14 0537 07/12/14 0239 07/13/14 0245  HGB 10.2* 10.1* 8.1* 7.8* 8.4*    Recent Labs  07/12/14 0239 07/13/14 0245  WBC 12.9* 16.5*  RBC 2.90* 3.12*  HCT 24.2* 26.2*  PLT 446* 569*    Recent Labs  07/12/14 0239 07/13/14 0245  NA 146 149*  K 4.1 3.8  CL 112 111  CO2 23 17*  BUN 40* 35*  CREATININE 1.59* 1.49*  GLUCOSE 133* 90  CALCIUM 8.6 8.9    Recent Labs  07/10/14 2109  INR 1.29   MRI shows osteo of the hallux and extensive soft tissue involvement of the medial rays at the forefoot.  PE:  L foot with swelling and skin slough at medial 2 rays.  Purulent drainage at the plantar and dorsal wounds.  No lymphadenopathy.  5/5 strength in PF and Df of the toes.  Assessment/Plan: R forefoot osteomyelitis.  To OR tomorrow for 1st and 2nd ray amputation v. transmet amputation.  Pt has had attempted I and D of this wound which failed.  The infection has progressed to the point where it caused sepsis which very recently threatened his life.  I believe the only realistic option to eradicate the infection is transmetatarsal amputation.  I explained this to the patient in detail.  He wants to talk to his cousin.  I've put in orders for surgery tomorrow.   Wylene Simmer 07/13/2014, 1:07 PM

## 2014-07-13 NOTE — Progress Notes (Signed)
PULMONARY / CRITICAL CARE MEDICINE HISTORY AND PHYSICAL EXAMINATION   Name: Joshua [name] MRN: [id number] DOB: September 27, 1951    ADMISSION DATE:  07/10/2014  PRIMARY SERVICE: PCCM  CHIEF COMPLAINT:  Altered Mental Status  BRIEF PATIENT DESCRIPTION: 63 y/o M w/ complex history of DM, c/b diabetic nephropathy with nephrotic proteinuria, chronic non-healing diabetic foot ulcer, hx of cardiac arrest in March '15 (unknown cause) who presents w/ fever, hypoglycemia, AKI, and gap metabolic acidosis.  SIGNIFICANT EVENTS / STUDIES:  8/16 Intubated in ED 8/16 CT head>>>neg 8/16 CT left lower ext>>>Diffuse cellulitis most notable surrounding the great toe.<BR> <BR>Soft tissue abscess along the dorsal and medial aspect of the<BR>proximal phalanx of the great toe measuring a maximum of 3.2 x 2.0<BR>cm.<BR> 8/18 MRi foot>>>Osteomyelitis involving the first digit sesamoids and the  proximal and distal phalanges of the first digit.  2. Plantar ulceration below the first digit sesamoids, with soft  tissue infection in this vicinity which appears to drain to the skin  rather than forming a walled off pocket.  3. A second fluid collection along the dorsal -lateral base of the  great toe likewise appears to drain to the skin in the vicinity of  the dorsal web space.   LINES / TUBES: ETT, 7.5 mm -- 8/16 >> Foley -- 8/16 >> PIV x2 - 8/16 >> OGT -- 8/16 >>  CULTURES: Blood x2 -- 8/16 (AFTER Abx dosed by ED)>> Sputum -- 8/16 >> Gram stain: rare Gram positive Cocci Urine -- 8/16 >> NG MRSA 8/16 >> Positive Wound 8/18>>>  ANTIBIOTICS: Vanc -- 8/16 >> Zosyn-- 8/16 >>  SUBJECTIVE: extubated  VITAL SIGNS: Temp:  [97.7 F (36.5 C)-100.3 F (37.9 C)] 97.7 F (36.5 C) (08/19 0839) Pulse Rate:  [29-118] 110 (08/19 1000) Resp:  [17-45] 17 (08/19 1000) BP: (140-247)/(75-180) 179/87 mmHg (08/19 1000) SpO2:  [86 %-100 %] 98 % (08/19 1000) Weight:  [62.2 kg (137 lb 2 oz)] 62.2 kg (137 lb 2 oz) (08/19  0439) HEMODYNAMICS:   VENTILATOR SETTINGS:   INTAKE / OUTPUT: Intake/Output     08/18 0701 - 08/19 0700 08/19 0701 - 08/20 0700   I.V. (mL/kg) 918.7 (14.8) 120 (1.9)   NG/GT 250    IV Piggyback 350 50   Total Intake(mL/kg) 1518.7 (24.4) 170 (2.7)   Urine (mL/kg/hr) 1215 (0.8) 160 (0.5)   Total Output 1215 160   Net +303.7 +10        Urine Occurrence 2 x    Stool Occurrence 1 x      PHYSICAL EXAMINATION: General:  Thin black man, chronically ill-appearing, no distress Neuro:  Awake, nonfocal, agitated at times HEENT:  ETT/ OG No adenopathy, no JVD Cardiovascular: s1 s2 reg regular, no M/R/G Lungs:  CTA Abdomen:  Soft, non-distended. No r/g Musculoskeletal:  Bilateral LE discoloration, L foot with ulcer and right foot ulcer, oozing, swollen and foul smelling.  Skin:  Ruptured bullae (appear chronic) over B lower extremities, R worse than L.  LABS:  CBC  Recent Labs Lab 07/11/14 0537 07/12/14 0239 07/13/14 0245  WBC 18.9* 12.9* 16.5*  HGB 8.1* 7.8* 8.4*  HCT 25.4* 24.2* 26.2*  PLT 375 446* 569*   Coag's  Recent Labs Lab 07/10/14 2109  APTT 30  INR 1.29   BMET  Recent Labs Lab 07/11/14 0537 07/12/14 0239 07/13/14 0245  NA 143 146 149*  K 4.1 4.1 3.8  CL 108 112 111  CO2 21 23 17*  BUN 35* 40* 35*  CREATININE 1.39*  1.59* 1.49*  GLUCOSE 96 133* 90   Electrolytes  Recent Labs Lab 07/11/14 0537 07/12/14 0239 07/13/14 0245  CALCIUM 9.0 8.6 8.9   Sepsis Markers  Recent Labs Lab 07/10/14 1620 07/10/14 1915 07/10/14 2109 07/10/14 2255 07/11/14 0537 07/12/14 0239  LATICACIDVEN 1.61 1.19 1.3  --   --   --   PROCALCITON  --   --   --  0.44 0.47 0.51   ABG  Recent Labs Lab 07/10/14 2100 07/12/14 0524 07/12/14 1112  PHART 7.304* 7.357 7.373  PCO2ART 43.2 44.4 42.1  PO2ART 67.0* 129.0* 95.0   Liver Enzymes  Recent Labs Lab 07/10/14 1610 07/10/14 2109 07/12/14 0239  AST 44* 34 24  ALT 45 33 26  ALKPHOS 242* 237* 161*  BILITOT  0.7 0.7 0.5  ALBUMIN 2.8* 2.2* 1.8*   Cardiac Enzymes  Recent Labs Lab 07/10/14 2255 07/11/14 0537 07/11/14 1037  TROPONINI 0.49* 0.44* 0.33*   Glucose  Recent Labs Lab 07/12/14 1204 07/12/14 1531 07/12/14 2020 07/13/14 0020 07/13/14 0344 07/13/14 0832  GLUCAP 111* 94 82 84 104* 112*    Imaging Mr Foot Left Wo Contrast  07/12/2014   CLINICAL DATA:  Sepsis. Ulcer of the great toe, query need for incision and drainage. Query osteomyelitis/abscess.  EXAM: MRI OF THE LEFT FOREFOOT WITHOUT CONTRAST  TECHNIQUE: Multiplanar, multisequence MR imaging was performed. No intravenous contrast was administered.  COMPARISON:  07/10/2014 CT scan ; MRI from 01/09/2014  FINDINGS: There is abnormal marrow edema involving the proximal and distal phalanges of the great toe and also involving the first digit sesamoids. There is associated periostitis in the appearance is compatible with osteomyelitis.  There is extensive abnormal soft tissue infiltration just plantar to the sesamoids with would appears to be a draining sinus tract to the skin versus ulceration in this vicinity. This extends down to the level of the sesamoids. I suspect that there is likely infection in this vicinity draining to the skin, rather than a walled off abscess.  Along the dorsal -lateral base of the great toe, there is a fluid collection in the soft tissues which appears to drain to the skin along the web space region on image [id number] series 5, potentially a draining sinus tract.  Edema is present in the plantar musculature of the foot, favoring myositis.  No significant abnormal osseous edema is observed in the remainder the foot. There does appear to be some subcutaneous edema along the medial ankle.  IMPRESSION: 1. Osteomyelitis involving the first digit sesamoids and the proximal and distal phalanges of the first digit. 2. Plantar ulceration below the first digit sesamoids, with soft tissue infection in this vicinity which appears  to drain to the skin rather than forming a walled off pocket. 3. A second fluid collection along the dorsal -lateral base of the great toe likewise appears to drain to the skin in the vicinity of the dorsal web space. 4. Fasciitis along the plantar musculature of the foot.   Electronically Signed   By: Sherryl Barters M.D.   On: 07/12/2014 20:14   Dg Chest Port 1 View  07/13/2014   CLINICAL DATA:  Shortness of breath  EXAM: PORTABLE CHEST - 1 VIEW  COMPARISON:  07/12/2014  FINDINGS: Interval tracheal and esophageal extubation. Pulmonary inflation remains stable, with mild atelectatic changes at the bases. There is chronic elevation of the right diaphragm. Chronic cardiomegaly. Stable upper mediastinal contours. Suspect small left pleural effusion. No edema, consolidation, or pneumothorax.  IMPRESSION: Stable pulmonary inflation  after extubation.   Electronically Signed   By: Jorje Guild M.D.   On: 07/13/2014 05:30   Dg Chest Port 1 View  07/12/2014   CLINICAL DATA:  Check endotracheal tube.  EXAM: PORTABLE CHEST - 1 VIEW  COMPARISON:  07/10/2014  FINDINGS: The endotracheal tube is approximately 2.5 cm above the carina. Stable elevation of the right hemidiaphragm. Slightly improved aeration at the left lung base with residual basilar densities. Heart size is stable. No evidence for a pneumothorax. Nasogastric tube extends into the abdomen but the tip is beyond the image. Mildly improved aeration at the right lung base.  IMPRESSION: Improving aeration at both lung bases.  Support apparatuses as described.   Electronically Signed   By: Markus Daft M.D.   On: 07/12/2014 07:44    EKG:  Non obtained CXR: Reviewed, aeration improving of lung bases bilaterally, ETT and OG in good position.  ASSESSMENT / PLAN:  Active Problems:   Severe sepsis with acute organ dysfunction  PULMONARY A: Hypoxic and Hypercapnic Respiratory Failure Pulmonary Edema vs ALI History of positive PPD, s/p treatment by [facility]  Dept therapy (AFB cx negative) P:   - IS -atx still noted  CARDIOVASCULAR A: Hypertension History of cardiac arrest Heart Failure with Preserved EF R/o demand ishcemia P:   -Hydralazine PRN for HTN, increase to 20  -hydralazine when able to take pills -Tele  RENAL A: CKD with nephrotic syndrome AG metabolic acidosis- resolved Hyperchloremia NONAG acidosis, r/o type 4 P:   -Chem in am  -Allow pos balance -change to bicarb for non ag, especially pre op  GASTROINTESTINAL A: History of C. Diff No diarrhea noted Hypoalbuminemia dysphagia P:   - Pepcid -slp needed  HEMATOLOGIC A: Leukocysosis Anemia, chronic, dilutional dvt prevention P:   - Sub q hep - Hold iron in setting of sepsis, may cause harm, re add when out of icu  INFECTIOUS A: Severe sepsis due to diabetic foot ulcer infection / abscess-  Osteomyelitis possible MRSA positive P: - mri reviewed, for amputation -ABX per ID  ENDOCRINE A: DM2 P:   - Q4h cbg  NEUROLOGIC A: Metabolic Encephalopathy- improved P:   - may need prn  Haldol low dose  TODAY'S SUMMARY:  Amputate in am , bicarb to fluids to NONAG, crt improved, abx per ID  To sitter, floor  I have personally obtained a history, examined the patient, evaluated laboratory and imaging results, formulated the assessment and plan and placed orders.   Lavon Paganini. Titus Mould, MD, [place] Pgr: [facility]

## 2014-07-14 ENCOUNTER — Encounter (HOSPITAL_COMMUNITY): Payer: Self-pay | Admitting: Anesthesiology

## 2014-07-14 ENCOUNTER — Encounter (HOSPITAL_COMMUNITY)
Admission: EM | Disposition: A | Discharge status: Skilled Nursing Facility | Payer: Self-pay | Source: Home / Self Care | Attending: Internal Medicine

## 2014-07-14 ENCOUNTER — Inpatient Hospital Stay (HOSPITAL_COMMUNITY): Payer: Self-pay | Admitting: Anesthesiology

## 2014-07-14 DIAGNOSIS — I509 Heart failure, unspecified: Secondary | ICD-10-CM

## 2014-07-14 DIAGNOSIS — N183 Chronic kidney disease, stage 3 unspecified: Secondary | ICD-10-CM

## 2014-07-14 DIAGNOSIS — E119 Type 2 diabetes mellitus without complications: Secondary | ICD-10-CM

## 2014-07-14 DIAGNOSIS — D649 Anemia, unspecified: Secondary | ICD-10-CM

## 2014-07-14 DIAGNOSIS — R7402 Elevation of levels of lactic acid dehydrogenase (LDH): Secondary | ICD-10-CM

## 2014-07-14 DIAGNOSIS — I129 Hypertensive chronic kidney disease with stage 1 through stage 4 chronic kidney disease, or unspecified chronic kidney disease: Secondary | ICD-10-CM

## 2014-07-14 DIAGNOSIS — J9600 Acute respiratory failure, unspecified whether with hypoxia or hypercapnia: Secondary | ICD-10-CM

## 2014-07-14 DIAGNOSIS — R740 Nonspecific elevation of levels of transaminase and lactic acid dehydrogenase [LDH]: Secondary | ICD-10-CM

## 2014-07-14 DIAGNOSIS — G9340 Encephalopathy, unspecified: Secondary | ICD-10-CM

## 2014-07-14 DIAGNOSIS — E872 Acidosis, unspecified: Secondary | ICD-10-CM

## 2014-07-14 DIAGNOSIS — E870 Hyperosmolality and hypernatremia: Secondary | ICD-10-CM

## 2014-07-14 DIAGNOSIS — M869 Osteomyelitis, unspecified: Secondary | ICD-10-CM

## 2014-07-14 DIAGNOSIS — I5030 Unspecified diastolic (congestive) heart failure: Secondary | ICD-10-CM

## 2014-07-14 DIAGNOSIS — E876 Hypokalemia: Secondary | ICD-10-CM

## 2014-07-14 HISTORY — PX: TRANSMETATARSAL AMPUTATION: SHX6197

## 2014-07-14 LAB — CBC
HCT: 25.5 % — ABNORMAL LOW (ref 39.0–52.0)
HCT: 26 % — ABNORMAL LOW (ref 39.0–52.0)
HCT: 26.4 % — ABNORMAL LOW (ref 39.0–52.0)
Hemoglobin: 8.1 g/dL — ABNORMAL LOW (ref 13.0–17.0)
Hemoglobin: 8.3 g/dL — ABNORMAL LOW (ref 13.0–17.0)
Hemoglobin: 8.6 g/dL — ABNORMAL LOW (ref 13.0–17.0)
MCH: 27.1 pg (ref 26.0–34.0)
MCH: 27.3 pg (ref 26.0–34.0)
MCH: 27.4 pg (ref 26.0–34.0)
MCHC: 31.8 g/dL (ref 30.0–36.0)
MCHC: 31.9 g/dL (ref 30.0–36.0)
MCHC: 32.6 g/dL (ref 30.0–36.0)
MCV: 83.3 fL (ref 78.0–100.0)
MCV: 85.8 fL (ref 78.0–100.0)
MCV: 85.9 fL (ref 78.0–100.0)
PLATELETS: 552 10*3/uL — AB (ref 150–400)
PLATELETS: 569 10*3/uL — AB (ref 150–400)
Platelets: 576 10*3/uL — ABNORMAL HIGH (ref 150–400)
RBC: 2.97 MIL/uL — AB (ref 4.22–5.81)
RBC: 3.03 MIL/uL — AB (ref 4.22–5.81)
RBC: 3.17 MIL/uL — AB (ref 4.22–5.81)
RDW: 16.5 % — AB (ref 11.5–15.5)
RDW: 16.8 % — ABNORMAL HIGH (ref 11.5–15.5)
RDW: 16.9 % — AB (ref 11.5–15.5)
WBC: 12.7 10*3/uL — AB (ref 4.0–10.5)
WBC: 13.6 10*3/uL — ABNORMAL HIGH (ref 4.0–10.5)
WBC: 14.4 10*3/uL — AB (ref 4.0–10.5)

## 2014-07-14 LAB — CBC WITH DIFFERENTIAL/PLATELET
BASOS ABS: 0.1 10*3/uL (ref 0.0–0.1)
BASOS PCT: 0 % (ref 0–1)
EOS ABS: 0.3 10*3/uL (ref 0.0–0.7)
Eosinophils Relative: 2 % (ref 0–5)
HCT: 26.9 % — ABNORMAL LOW (ref 39.0–52.0)
Hemoglobin: 8.7 g/dL — ABNORMAL LOW (ref 13.0–17.0)
Lymphocytes Relative: 15 % (ref 12–46)
Lymphs Abs: 2.1 10*3/uL (ref 0.7–4.0)
MCH: 26.9 pg (ref 26.0–34.0)
MCHC: 32.3 g/dL (ref 30.0–36.0)
MCV: 83.3 fL (ref 78.0–100.0)
Monocytes Absolute: 1 10*3/uL (ref 0.1–1.0)
Monocytes Relative: 7 % (ref 3–12)
NEUTROS PCT: 76 % (ref 43–77)
Neutro Abs: 10.3 10*3/uL — ABNORMAL HIGH (ref 1.7–7.7)
PLATELETS: 589 10*3/uL — AB (ref 150–400)
RBC: 3.23 MIL/uL — ABNORMAL LOW (ref 4.22–5.81)
RDW: 16.5 % — AB (ref 11.5–15.5)
WBC: 13.7 10*3/uL — ABNORMAL HIGH (ref 4.0–10.5)

## 2014-07-14 LAB — BASIC METABOLIC PANEL
Anion gap: 14 (ref 5–15)
Anion gap: 15 (ref 5–15)
Anion gap: 17 — ABNORMAL HIGH (ref 5–15)
Anion gap: 12 (ref 5–15)
BUN: 35 mg/dL — AB (ref 6–23)
BUN: 32 mg/dL — ABNORMAL HIGH (ref 6–23)
BUN: 31 mg/dL — ABNORMAL HIGH (ref 6–23)
BUN: 32 mg/dL — ABNORMAL HIGH (ref 6–23)
CALCIUM: 9.4 mg/dL (ref 8.4–10.5)
CHLORIDE: 118 meq/L — AB (ref 96–112)
CHLORIDE: 115 meq/L — AB (ref 96–112)
CHLORIDE: 114 meq/L — AB (ref 96–112)
CO2: 25 mEq/L (ref 19–32)
CO2: 23 meq/L (ref 19–32)
CO2: 21 mEq/L (ref 19–32)
CO2: 24 mEq/L (ref 19–32)
CREATININE: 1.49 mg/dL — AB (ref 0.50–1.35)
CREATININE: 1.52 mg/dL — AB (ref 0.50–1.35)
Calcium: 9.4 mg/dL (ref 8.4–10.5)
Calcium: 9 mg/dL (ref 8.4–10.5)
Calcium: 8.8 mg/dL (ref 8.4–10.5)
Chloride: 115 mEq/L — ABNORMAL HIGH (ref 96–112)
Creatinine, Ser: 1.41 mg/dL — ABNORMAL HIGH (ref 0.50–1.35)
Creatinine, Ser: 1.43 mg/dL — ABNORMAL HIGH (ref 0.50–1.35)
GFR calc Af Amer: 60 mL/min — ABNORMAL LOW (ref >90)
GFR calc non Af Amer: 49 mL/min — ABNORMAL LOW (ref >90)
GFR calc non Af Amer: 52 mL/min — ABNORMAL LOW (ref >90)
GFR calc non Af Amer: 51 mL/min — ABNORMAL LOW (ref >90)
GFR, EST AFRICAN AMERICAN: 56 mL/min — AB (ref >90)
GFR, EST AFRICAN AMERICAN: 59 mL/min — AB (ref >90)
GFR, EST AFRICAN AMERICAN: 55 mL/min — AB (ref >90)
GFR, EST NON AFRICAN AMERICAN: 47 mL/min — AB (ref >90)
Glucose, Bld: 120 mg/dL — ABNORMAL HIGH (ref 70–99)
Glucose, Bld: 109 mg/dL — ABNORMAL HIGH (ref 70–99)
Glucose, Bld: 144 mg/dL — ABNORMAL HIGH (ref 70–99)
Glucose, Bld: 254 mg/dL — ABNORMAL HIGH (ref 70–99)
POTASSIUM: 3.5 meq/L — AB (ref 3.7–5.3)
POTASSIUM: 3.7 meq/L (ref 3.7–5.3)
POTASSIUM: 3.7 meq/L (ref 3.7–5.3)
Potassium: 3.3 mEq/L — ABNORMAL LOW (ref 3.7–5.3)
SODIUM: 156 meq/L — AB (ref 137–147)
Sodium: 154 mEq/L — ABNORMAL HIGH (ref 137–147)
Sodium: 153 mEq/L — ABNORMAL HIGH (ref 137–147)
Sodium: 150 mEq/L — ABNORMAL HIGH (ref 137–147)

## 2014-07-14 LAB — GLUCOSE, CAPILLARY
GLUCOSE-CAPILLARY: 90 mg/dL (ref 70–99)
GLUCOSE-CAPILLARY: 153 mg/dL — AB (ref 70–99)
GLUCOSE-CAPILLARY: 228 mg/dL — AB (ref 70–99)
Glucose-Capillary: 112 mg/dL — ABNORMAL HIGH (ref 70–99)
Glucose-Capillary: 113 mg/dL — ABNORMAL HIGH (ref 70–99)
Glucose-Capillary: 118 mg/dL — ABNORMAL HIGH (ref 70–99)
Glucose-Capillary: 131 mg/dL — ABNORMAL HIGH (ref 70–99)
Glucose-Capillary: 133 mg/dL — ABNORMAL HIGH (ref 70–99)

## 2014-07-14 LAB — PROTIME-INR
INR: 1.27 (ref 0.00–1.49)
Prothrombin Time: 15.9 seconds — ABNORMAL HIGH (ref 11.6–15.2)

## 2014-07-14 LAB — MAGNESIUM: MAGNESIUM: 2.3 mg/dL (ref 1.5–2.5)

## 2014-07-14 LAB — APTT: APTT: 40 s — AB (ref 24–37)

## 2014-07-14 LAB — TROPONIN I

## 2014-07-14 SURGERY — AMPUTATION, FOOT, TRANSMETATARSAL
Anesthesia: General | Site: Foot | Laterality: Left

## 2014-07-14 MED ORDER — METOCLOPRAMIDE HCL 5 MG/ML IJ SOLN: 5.0000 mg | Freq: Three times a day (TID) | INTRAMUSCULAR | Status: DC | PRN

## 2014-07-14 MED ORDER — EPHEDRINE SULFATE 50 MG/ML IJ SOLN
INTRAMUSCULAR | Status: DC | PRN
  Administered 2014-07-14: 10 mg via INTRAVENOUS

## 2014-07-14 MED ORDER — SODIUM CHLORIDE 0.9 % IR SOLN
Status: DC | PRN
  Administered 2014-07-14: 1000 mL

## 2014-07-14 MED ORDER — INSULIN ASPART 100 UNIT/ML ~~LOC~~ SOLN
0.0000 [IU] | Freq: Three times a day (TID) | SUBCUTANEOUS | Status: DC
  Administered 2014-07-14 – 2014-07-15 (×4): 2 [IU] via SUBCUTANEOUS
  Administered 2014-07-16 – 2014-07-17 (×4): 3 [IU] via SUBCUTANEOUS

## 2014-07-14 MED ORDER — PHENYLEPHRINE HCL 10 MG/ML IJ SOLN
INTRAMUSCULAR | Status: DC | PRN
  Administered 2014-07-14 (×2): 80 ug via INTRAVENOUS
  Administered 2014-07-14: 40 ug via INTRAVENOUS
  Administered 2014-07-14 (×3): 80 ug via INTRAVENOUS

## 2014-07-14 MED ORDER — MIDAZOLAM HCL 2 MG/2ML IJ SOLN
INTRAMUSCULAR | Status: AC
  Filled 2014-07-14: qty 2

## 2014-07-14 MED ORDER — HYDROMORPHONE HCL PF 1 MG/ML IJ SOLN: 0.5000 mg | INTRAMUSCULAR | Status: DC | PRN

## 2014-07-14 MED ORDER — SODIUM CHLORIDE 0.9 % IV SOLN: INTRAVENOUS | Status: DC

## 2014-07-14 MED ORDER — SODIUM CHLORIDE 0.9 % IV SOLN
INTRAVENOUS | Status: DC | PRN
  Administered 2014-07-14 (×2): via INTRAVENOUS

## 2014-07-14 MED ORDER — PROPOFOL 10 MG/ML IV BOLUS
INTRAVENOUS | Status: DC | PRN
  Administered 2014-07-14: 200 mg via INTRAVENOUS

## 2014-07-14 MED ORDER — DEXTROSE-NACL 5-0.9 % IV SOLN
INTRAVENOUS | Status: DC
  Administered 2014-07-14 – 2014-07-17 (×6): via INTRAVENOUS

## 2014-07-14 MED ORDER — FENTANYL CITRATE 0.05 MG/ML IJ SOLN
INTRAMUSCULAR | Status: DC | PRN
  Administered 2014-07-14: 100 ug via INTRAVENOUS

## 2014-07-14 MED ORDER — FENTANYL CITRATE 0.05 MG/ML IJ SOLN
INTRAMUSCULAR | Status: AC
  Filled 2014-07-14: qty 5

## 2014-07-14 MED ORDER — ONDANSETRON HCL 4 MG/2ML IJ SOLN: 4.0000 mg | Freq: Four times a day (QID) | INTRAMUSCULAR | Status: DC | PRN

## 2014-07-14 MED ORDER — SENNA 8.6 MG PO TABS
1.0000 | ORAL_TABLET | Freq: Two times a day (BID) | ORAL | Status: DC
  Administered 2014-07-14 – 2014-07-19 (×10): 8.6 mg via ORAL
  Filled 2014-07-14 (×14): qty 1

## 2014-07-14 MED ORDER — LIDOCAINE HCL (CARDIAC) 20 MG/ML IV SOLN
INTRAVENOUS | Status: DC | PRN
Start: 2014-07-14 — End: 2014-07-14
  Administered 2014-07-14: 100 mg via INTRAVENOUS

## 2014-07-14 MED ORDER — OXYCODONE HCL 5 MG PO TABS: 5.0000 mg | ORAL_TABLET | ORAL | Status: DC | PRN

## 2014-07-14 MED ORDER — ENOXAPARIN SODIUM 40 MG/0.4ML ~~LOC~~ SOLN
40.0000 mg | SUBCUTANEOUS | Status: DC
  Administered 2014-07-15 – 2014-07-19 (×5): 40 mg via SUBCUTANEOUS
  Filled 2014-07-14 (×6): qty 0.4

## 2014-07-14 MED ORDER — ONDANSETRON HCL 4 MG/2ML IJ SOLN
INTRAMUSCULAR | Status: DC | PRN
  Administered 2014-07-14: 4 mg via INTRAVENOUS

## 2014-07-14 MED ORDER — ONDANSETRON HCL 4 MG/2ML IJ SOLN: 4.0000 mg | Freq: Once | INTRAMUSCULAR | Status: DC | PRN

## 2014-07-14 MED ORDER — DOCUSATE SODIUM 100 MG PO CAPS
100.0000 mg | ORAL_CAPSULE | Freq: Two times a day (BID) | ORAL | Status: DC
  Administered 2014-07-14 – 2014-07-19 (×10): 100 mg via ORAL
  Filled 2014-07-14 (×10): qty 1

## 2014-07-14 MED ORDER — POTASSIUM CHLORIDE 10 MEQ/100ML IV SOLN
10.0000 meq | INTRAVENOUS | Status: AC
  Administered 2014-07-14 (×2): 10 meq via INTRAVENOUS
  Filled 2014-07-14: qty 100

## 2014-07-14 MED ORDER — BACITRACIN ZINC 500 UNIT/GM EX OINT
TOPICAL_OINTMENT | CUTANEOUS | Status: AC
  Filled 2014-07-14: qty 15

## 2014-07-14 MED ORDER — ONDANSETRON HCL 4 MG PO TABS: 4.0000 mg | ORAL_TABLET | Freq: Four times a day (QID) | ORAL | Status: DC | PRN

## 2014-07-14 MED ORDER — PROPOFOL 10 MG/ML IV BOLUS
INTRAVENOUS | Status: AC
  Filled 2014-07-14: qty 20

## 2014-07-14 MED ORDER — DEXTROSE 5 % IV SOLN: INTRAVENOUS | Status: DC

## 2014-07-14 MED ORDER — METOCLOPRAMIDE HCL 5 MG PO TABS: 5.0000 mg | ORAL_TABLET | Freq: Three times a day (TID) | ORAL | Status: DC | PRN

## 2014-07-14 MED ORDER — HYDROMORPHONE HCL PF 1 MG/ML IJ SOLN: 0.2500 mg | INTRAMUSCULAR | Status: DC | PRN

## 2014-07-14 SURGICAL SUPPLY — 58 items
BANDAGE ELASTIC 4 VELCRO ST LF (GAUZE/BANDAGES/DRESSINGS) IMPLANT
BANDAGE ESMARK 6X9 LF (GAUZE/BANDAGES/DRESSINGS) IMPLANT
BLADE LONG MED 31MMX9MM (MISCELLANEOUS) ×1
BLADE LONG MED 31X9 (MISCELLANEOUS) ×2 IMPLANT
BLADE SURG 10 STRL SS (BLADE) ×6 IMPLANT
BNDG COHESIVE 4X5 TAN STRL (GAUZE/BANDAGES/DRESSINGS) ×3 IMPLANT
BNDG COHESIVE 6X5 TAN STRL LF (GAUZE/BANDAGES/DRESSINGS) ×3 IMPLANT
BNDG CONFORM 3 STRL LF (GAUZE/BANDAGES/DRESSINGS) IMPLANT
BNDG ESMARK 6X9 LF (GAUZE/BANDAGES/DRESSINGS)
BNDG GAUZE ELAST 4 BULKY (GAUZE/BANDAGES/DRESSINGS) ×3 IMPLANT
CANISTER SUCT 3000ML (MISCELLANEOUS) ×3 IMPLANT
CHLORAPREP W/TINT 26ML (MISCELLANEOUS) ×3 IMPLANT
CONT SPEC 4OZ CLIKSEAL STRL BL (MISCELLANEOUS) ×3 IMPLANT
COVER SURGICAL LIGHT HANDLE (MISCELLANEOUS) ×3 IMPLANT
CUFF TOURNIQUET SINGLE 34IN LL (TOURNIQUET CUFF) ×3 IMPLANT
CUFF TOURNIQUET SINGLE 44IN (TOURNIQUET CUFF) IMPLANT
DRAIN CHANNEL 10M FLAT 3/4 FLT (DRAIN) IMPLANT
DRAIN PENROSE 1/2X12 LTX STRL (WOUND CARE) IMPLANT
DRAPE U-SHAPE 47X51 STRL (DRAPES) ×3 IMPLANT
DRSG ADAPTIC 3X8 NADH LF (GAUZE/BANDAGES/DRESSINGS) IMPLANT
DRSG PAD ABDOMINAL 8X10 ST (GAUZE/BANDAGES/DRESSINGS) ×6 IMPLANT
ELECT REM PT RETURN 9FT ADLT (ELECTROSURGICAL) ×3
ELECTRODE REM PT RTRN 9FT ADLT (ELECTROSURGICAL) ×1 IMPLANT
EVACUATOR SILICONE 100CC (DRAIN) IMPLANT
GAUZE SPONGE 4X4 12PLY STRL (GAUZE/BANDAGES/DRESSINGS) ×3 IMPLANT
GLOVE BIO SURGEON STRL SZ7 (GLOVE) ×3 IMPLANT
GLOVE BIO SURGEON STRL SZ8 (GLOVE) ×3 IMPLANT
GLOVE BIOGEL PI IND STRL 8 (GLOVE) ×1 IMPLANT
GLOVE BIOGEL PI INDICATOR 8 (GLOVE) ×2
GOWN STRL REUS W/ TWL XL LVL3 (GOWN DISPOSABLE) ×4 IMPLANT
GOWN STRL REUS W/TWL XL LVL3 (GOWN DISPOSABLE) ×8
KIT BASIN OR (CUSTOM PROCEDURE TRAY) ×3 IMPLANT
KIT ROOM TURNOVER OR (KITS) ×3 IMPLANT
NS IRRIG 1000ML POUR BTL (IV SOLUTION) ×3 IMPLANT
PACK ORTHO EXTREMITY (CUSTOM PROCEDURE TRAY) ×3 IMPLANT
PAD ABD 8X10 STRL (GAUZE/BANDAGES/DRESSINGS) ×3 IMPLANT
PAD ARMBOARD 7.5X6 YLW CONV (MISCELLANEOUS) ×6 IMPLANT
PAD CAST 4YDX4 CTTN HI CHSV (CAST SUPPLIES) ×1 IMPLANT
PADDING CAST COTTON 4X4 STRL (CAST SUPPLIES) ×2
SET CYSTO W/LG BORE CLAMP LF (SET/KITS/TRAYS/PACK) IMPLANT
SOAP 2 % CHG 4 OZ (WOUND CARE) ×3 IMPLANT
SPONGE GAUZE 4X4 12PLY STER LF (GAUZE/BANDAGES/DRESSINGS) ×3 IMPLANT
SPONGE LAP 4X18 X RAY DECT (DISPOSABLE) IMPLANT
STAPLER VISISTAT 35W (STAPLE) IMPLANT
SUCTION FRAZIER TIP 10 FR DISP (SUCTIONS) IMPLANT
SUT ETHILON 2 0 PSLX (SUTURE) ×6 IMPLANT
SUT ETHILON 3 0 FSL (SUTURE) IMPLANT
SUT PROLENE 3 0 PS 2 (SUTURE) IMPLANT
SUT VIC AB 2-0 CT1 27 (SUTURE)
SUT VIC AB 2-0 CT1 TAPERPNT 27 (SUTURE) IMPLANT
SUT VIC AB 3-0 PS2 18 (SUTURE)
SUT VIC AB 3-0 PS2 18XBRD (SUTURE) IMPLANT
TOWEL OR 17X24 6PK STRL BLUE (TOWEL DISPOSABLE) ×3 IMPLANT
TOWEL OR 17X26 10 PK STRL BLUE (TOWEL DISPOSABLE) ×3 IMPLANT
TUBE CONNECTING 12'X1/4 (SUCTIONS) ×1
TUBE CONNECTING 12X1/4 (SUCTIONS) ×2 IMPLANT
TUBING CYSTO DISP (UROLOGICAL SUPPLIES) IMPLANT
YANKAUER SUCT BULB TIP NO VENT (SUCTIONS) ×3 IMPLANT

## 2014-07-14 NOTE — Progress Notes (Addendum)
Internal Medicine Teaching Service Transfer Note  Date: 07/14/2014               Patient Name:  Joshua [name] MRN: [id number]  DOB: Dec 03, 1950 Age / Sex: 63 y.o., male   PCP: Francesca Oman, DO         Medical Service: Internal Medicine Teaching Service         Attending Physician: Dr. Bartholomew Crews, MD    First Contact: Dr. Genene Churn Pager: [phone]  Second Contact: Dr. Hayes Ludwig Pager: [phone]       After Hours (After 5p/  First Contact Pager: [phone]  weekends / holidays): Second Contact Pager: [phone]   Chief Complaint: L foot OM  History of Present Illness from Dr. Kevin Fenton note 8/16: Joshua [name] is a 63 y/o man with a complex past medical history most notable for a history of DM2 with complications of diabetic nephropathy with nephrotic range proteinuria, chronic non-healing foot ulcer, as well as cardiac arrest 01/30/14 with ROSC and return to normal neurologic status who presents with altered mental status. The patient had been in a SNF and was discharged home on the 3rd of this month and was doing well, per report of the patient's cousin, who lives with the patient, and who helps take care of him. He had been able to ambulate around the house with the assistance of a cane and a walker, and had been able to perform basic ADLs (bathe, clothe, use restroom) independently. On the day of admission, the patient was confused, and became more so, and ultimately was brought to the ED. There, he was found to be febrile, tachycardic, tachypneic, and had a leukocytosis. A chemistry panel showed a gap metabolic acidosis and concominant respiratory alkaosis. His mental status declined, and the ICU was consulted. The patient then progressed to a respiratory acidosis and he was intubated for further management.  Hospital course/Significant events: 8/16 admission: Per ED provider note, he has a hx of prior admission in Feb 2015 for I&d of L foot by ortho with what appears to be poor healing. He was  also found to be hypoglycemic and unconscious at SNF with CBG of 30 that improved to 130 after amp of D50 by EMS. He was found to be tachypneic, tachycardic, hypertensive 200/124, and rectal temp of 101.47f with 84% room air saturation. Code sepsis called in ED.  -VBG on admission:7.344/29.2/55/15.6/84% with temp of 101.27F -Initial AG 20 with lactic acid of 1.61 and bicarb of 19 on CMET -ABG done later that evening @2100 : 7.304/73.2/67/21.2/90% with temp of 100.63F.   -He was initially placed on BiPAP, noted to have ?seizure activity, decision made to intubate.  -Admitted to ICU for severe sepsis, encephalopathy, metabolic and respiratory acidosis, acute hypoxic and hypercapnic respiratory failure with  Pulmonary edema with s/p intubation in ED   -CXR portable: bibasilar airspace disease, R hemidiaphragm elevation, ?chronic atelectasis vs. PNA, small L pleural effusion -CT head: No evidence of acute intracranial disease, mild chronic small vessel disease -L foot xray: marked diffuse soft tissue swelling of forefoot, especially around great toe, open wound plantar aspect of first metatarsal phalangeal joint -CT left foot: Diffuse cellulitis most notable surrounding the great toe. Soft tissue abscess along the dorsal and medial aspect of the proximal phalanx of the great toe measuring a maximum of 3.2 x 2.0cm -Foley and OGT placed -Blood cx x3 after antibiotics in ED--NGTD, sputum cx--non-pathogenic oropharyngeal type flora, and urine cx--no growth -Started on Vanc and Zosyn -Cdiff  pcr orderd--does not appear to ever be sent or collected (hx of Cdif positive pcr March and May of 2015) -CE positive, trop 0.46; thought to be demand ischemia -MRSA positive 8/17: started on tube feeds, ortho consulted--great toe abscess decompressed, MRI ordered  8/18: MRI L foot: Osteomyelitis involving the first digit sesamoids and the proximal and distal phalanges of the first digit, Plantar ulceration below the first  digit sesamoids, with soft tissue infection in this vicinity which appears to drain to the skin rather than forming a walled off pocket, and a second fluid collection along the dorsal lateral base of the great toe likewise appears to drain to the skin in the vicinity of the dorsal web space. -ID consulted: Continue Vanc and Zosyn -Extubation, noted to have confusion and agitation in the evening -L Abscess culture: moderate staph aureus  8/19: Transferred to floor, per Dr. Doran Durand from ortho--plan for 1st and 2nd ray amputation vs. transmet amputation 8/20. Bicarb started.   8/20: Today, he was seen and examined at bedside.  He is alert, awake, and oriented x3 and asking for water claiming he is very thirsty. He remains tachypneic and tachycardic, but he says he breathes like this normally and denies any trouble breathing or chest pain.  He also denies any abdominal pain or foot pain. He is nervous about surgery and says "doctor, save my foot".  He did seem okay with the plan for possible toe amputations based on his discuss with ortho yesterday but he will discuss more. He did say he called his cousin, but it is unclear if he was able to speak with him definitively.  He remains afebrile with leukocytosis trending down and stable renal function and improving acidosis.   Today is day 5 of IV antibiotics.   Meds: Current Facility-Administered Medications  Medication Dose Route Frequency Provider Last Rate Last Dose  . 0.9 %  sodium chloride infusion   Intravenous Continuous Wylene Simmer, MD 50 mL/hr at 07/14/14 0041    . 0.9 %  sodium chloride infusion   Intravenous Continuous Jacquelyn Earlie Counts, [place]      . 0.9 %  sodium chloride infusion   Intravenous Continuous Hedy Jacob, PA-C      . acetaminophen (TYLENOL) tablet 1,000 mg  1,000 mg Oral Once Theressa Millard, [place]      . acetaminophen (TYLENOL) tablet 650 mg  650 mg Oral Q6H PRN Babette Relic, MD      . antiseptic oral rinse (CPC /  CETYLPYRIDINIUM CHLORIDE 0.05%) solution 7 mL  7 mL Mouth Rinse QID Raylene Miyamoto, MD   7 mL at 07/14/14 0436  . aspirin EC tablet 81 mg  81 mg Oral Daily Luz Brazen, MD      . chlorhexidine (PERIDEX) 0.12 % solution 15 mL  15 mL Mouth Rinse BID Raylene Miyamoto, MD   15 mL at 07/13/14 2100  . Chlorhexidine Gluconate Cloth 2 % PADS 6 each  6 each Topical Q0600 Raylene Miyamoto, MD   6 each at 07/14/14 [phone]  . famotidine (PEPCID) IVPB 20 mg  20 mg Intravenous Q12H Raylene Miyamoto, MD   20 mg at 07/13/14 2306  . hydrALAZINE (APRESOLINE) injection 20 mg  20 mg Intravenous Q4H PRN Raylene Miyamoto, MD   20 mg at 07/14/14 0434  . insulin aspart (novoLOG) injection 0-9 Units  0-9 Units Subcutaneous 6 times per day Luz Brazen, MD   1 Units at 07/14/14 0039  .  mupirocin ointment (BACTROBAN) 2 % 1 application  1 application Nasal BID Raylene Miyamoto, MD   1 application at [phone]  . piperacillin-tazobactam (ZOSYN) IVPB 3.375 g  3.375 g Intravenous Q8H Darnell Level Mancheril, RPH   3.375 g at 07/14/14 0105  . sodium bicarbonate 150 mEq in dextrose 5 % 1,000 mL infusion   Intravenous Continuous Raylene Miyamoto, MD 75 mL/hr at 07/13/14 1319    . vancomycin (VANCOCIN) 500 mg in sodium chloride 0.9 % 100 mL IVPB  500 mg Intravenous Q12H Darnell Level Mancheril, RPH   500 mg at 07/14/14 [id number]   Allergies: Allergies as of 07/10/2014  . (No Active Allergies)   Past Medical History  Diagnosis Date  . 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 Hypertension 10/14/2012  . Type 2 diabetes mellitus   . Diabetic nephropathy with proteinuria   . 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 Diastolic heart failure   . Cellulitis   . Convulsions   . Iron deficiency anemia   . 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 Anasarca   . Hypoalbuminemia   . Elevated alkaline phosphatase level   . Microcytic anemia   . GI bleed   . Nephrotic  syndrome   . Difficulty in walking(719.7)   . Muscle weakness (generalized)   . Type II or unspecified type diabetes mellitus with renal manifestations, not stated as uncontrolled   . Iron deficiency anemia, unspecified    Past Surgical History  Procedure Laterality Date  . Knee arthroscopy  2010    3 times, right knee  . I&d extremity Left 01/17/2014    Procedure: IRRIGATION AND DEBRIDEMENT  LEFT FOOT;  Surgeon: Wylene Simmer, MD;  Location: [place];  Service: Orthopedics;  Laterality: Left;   Family History  Problem Relation Age of Onset  . GI problems      none   History   Social History  . Marital Status: Single    Spouse Name: N/A    Number of Children: 5  . 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 Smoking status: Former Smoker -- 20 years    Types: Cigarettes    Quit date: 11/25/1992  . Smokeless tobacco: Never Used  . Alcohol Use: No     Comment: Rarely.  . Drug Use: No  . Sexual Activity: Not on file     Comment: 09/2012: > 15 partners in past 3 years, inconsistent condom use   Other Topics Concern  . Not on file   Social History Narrative   Has a masters degree in education   Review of Systems:  Constitutional:  Complaining of thirst  HEENT:  Dry mouth  Respiratory:  Denies SOB. Tachypnea  Cardiovascular:  Tachycardia but denies chest pain  Gastrointestinal:  Denies nausea, vomiting, abdominal pain  Genitourinary:  Denies dysuria--foley in place  Musculoskeletal:  Denies leg or foot pain. OM of L foot  Skin:  L OM of foot  Neurological:  ?Seizure on admission   Physical Exam: Blood pressure 191/97, pulse 104, temperature 98.5 F (36.9 C), temperature source Oral, resp. rate 18, height 5' 4.96" (1.65 m), weight 139 lb 15.9 oz (63.5 kg), SpO2 94.00%. Vitals reviewed. General: resting in bed, asking for water HEENT: EOMI Cardiac: Tachycardia Pulm: Decreased breath sounds b/l bases R>L,  ?rales on left lower Abd: soft, nontender, abdominal  respirations Ext: moving all 4 extremities, SCDs in place, L wrist IV wrapped up, shiny extremities, L warmer to touch than right, visible ulceration/skin breakdown on dorsal aspect of L foot near great and first toes, Plantar forefoot ulceration, no obvious drainage at this time, no tenderness to palpation, +dp Neuro: alert and oriented X3, strength weak symetrically in extremities, 3-4/5 upper extremities, 4/5 b/l lower extremities  Lab results: Basic Metabolic Panel:  Recent Labs  07/13/14 0245 07/14/14 0440  NA 149* 154*  K 3.8 3.3*  CL 111 115*  CO2 17* 25  GLUCOSE 90 120*  BUN 35* 35*  CREATININE 1.49* 1.49*  CALCIUM 8.9 9.4   Liver Function Tests:  Recent Labs  07/12/14 0239  AST 24  ALT 26  ALKPHOS 161*  BILITOT 0.5  PROT 6.5  ALBUMIN 1.8*   CBC:  Recent Labs  07/12/14 0239  07/14/14 0009 07/14/14 0440  WBC 12.9*  < > 14.4* 13.7*  NEUTROABS 9.5*  --   --  10.3*  HGB 7.8*  < > 8.6* 8.7*  HCT 24.2*  < > 26.4* 26.9*  MCV 83.4  < > 83.3 83.3  PLT 446*  < > 576* 589*  < > = values in this interval not displayed. Cardiac Enzymes:  Recent Labs  07/11/14 1037  TROPONINI 0.33*   CBG:  Recent Labs  07/13/14 0832 07/13/14 1245 07/13/14 1612 07/13/14 2001 07/14/14 0014 07/14/14 0424  GLUCAP 112* 117* 165* 150* 131* 113*   Fasting Lipid Panel:  Recent Labs  07/12/14 0239  TRIG 163*   Coagulation:  Recent Labs  07/14/14 0009  LABPROT 15.9*  INR 1.27   Imaging results:  Mr Foot Left Wo Contrast  07/12/2014   CLINICAL DATA:  Sepsis. Ulcer of the great toe, query need for incision and drainage. Query osteomyelitis/abscess.  EXAM: MRI OF THE LEFT FOREFOOT WITHOUT CONTRAST  TECHNIQUE: Multiplanar, multisequence MR imaging was performed. No intravenous contrast was administered.  COMPARISON:  07/10/2014 CT scan ; MRI from 01/09/2014  FINDINGS: There is abnormal marrow edema involving the  proximal and distal phalanges of the great toe and also involving the first digit sesamoids. There is associated periostitis in the appearance is compatible with osteomyelitis.  There is extensive abnormal soft tissue infiltration just plantar to the sesamoids with would appears to be a draining sinus tract to the skin versus ulceration in this vicinity. This extends down to the level of the sesamoids. I suspect that there is likely infection in this vicinity draining to the skin, rather than a walled off abscess.  Along the dorsal -lateral base of the great toe, there is a fluid collection in the soft tissues which appears to drain to the skin along the web space region on image 25-24 series 5, potentially a draining sinus tract.  Edema is present in the plantar musculature of the foot, favoring myositis.  No significant abnormal osseous edema is observed in the remainder the foot. There does appear to be some subcutaneous edema along the medial ankle.  IMPRESSION: 1. Osteomyelitis involving the first digit sesamoids and the proximal and distal phalanges of the first digit. 2. Plantar ulceration below the first digit sesamoids, with soft tissue infection in this vicinity which appears to drain to the skin rather than forming a walled off pocket. 3. A second fluid collection along the dorsal -lateral base of the great toe likewise appears to drain to the skin in the vicinity of the dorsal web space. 4. Fasciitis along  the plantar musculature of the foot.   Electronically Signed   By: Sherryl Barters M.D.   On: 07/12/2014 20:14   Dg Chest Port 1 View  07/13/2014   CLINICAL DATA:  Shortness of breath  EXAM: PORTABLE CHEST - 1 VIEW  COMPARISON:  07/12/2014  FINDINGS: Interval tracheal and esophageal extubation. Pulmonary inflation remains stable, with mild atelectatic changes at the bases. There is chronic elevation of the right diaphragm. Chronic cardiomegaly. Stable upper mediastinal contours. Suspect small left  pleural effusion. No edema, consolidation, or pneumothorax.  IMPRESSION: Stable pulmonary inflation after extubation.   Electronically Signed   By: Jorje Guild M.D.   On: 07/13/2014 05:30   Prior studies: Echo 01/2014: EF 55-60%, moderately dilated atrium, normal systolic function of LV, RV mildly dilated with mildly reduced systolic function.  R LE Venous Duplex 8/12: No evidence of DVT and no baker's cyst Other results: EKG 8/16:  Sinus tachycardia 101bpm, t wave flattening avL CE trending down 0.46-->0.33 from 8/16 to 8/17   Recent Labs Lab 07/10/14 2109 07/10/14 2255 07/11/14 0537 07/11/14 1037  TROPONINI 0.46* 0.49* 0.44* 0.33*   Assessment & Plan by Problem: Principal Problem:   Osteomyelitis of left foot Active Problems:   Hypertension   Diabetes mellitus with renal manifestation   Hypoalbuminemia   Chronic diastolic heart failure   Severe sepsis with acute organ dysfunction   Acute respiratory failure with hypercapnia   High anion gap metabolic acidosis L foot OM--confirmed by MRI. Ortho and ID following.  Has been on Vanc and Zosyn since admission. Today is day 5 of antibiotics.  Plan for OR today per ortho note yesterday if patient agrees for 1st and 2nd ray amputation vs. transmet amputation. Sepsis has resolved with no recent fevers and leukocytosis trending down and improved mental status. -NPO for now for surgery -Likely OR today -Appreciate ortho and ID following -Continue Vanc and Zosyn for now  AG metabolic acidosis--likely in setting of sepsis, also noted to have respiratory acidosis requiring intubation this admission. Improving as sepsis resolving and on IV antibiotics.  Bicarb started yesterday with AG of 21 and bicarb of 17.  AG improved to 14 today and Bicarb of 25. Lactic acid 1.3 on 8/16.  -will d/c bicarb -on IVF pre-op -trend BMET  Acute Hypernatremia and hypokalemia--Na up to 154 this morning.  He has been receiving IVF and has been NPO since  yesterday for OR today.  Free water deficit calculated to be ~3.46L  Recent Labs Lab 07/10/14 2109 07/11/14 0537 07/12/14 0239 07/13/14 0245 07/14/14 0440  NA 141 143 146 149* 154*  -currently on NS pre op, would favor starting 5% dextrose in water ~3-25mL/kg/hr for correction, goal max decrease of 9meq in 24 hours -BMET q2h if possible -replace K -check Mag  Hypoxic and hypercapnic respiratory failure--s/p intubation on admission and successful extubation 8/18.  Small L pleural effusion and pulmonary inflation on CXR. Remains tachypneic. Last ABG 8/18: 7.373/42.1/95/24.4/97%. Tachypnea could be anxiety or ?baseline (per patient). EF 55-60% per echo 01/2014.  -consider repeat cxr if no improvement in respiratory status  Tachycardia in setting of dCHF and new EKG changes--sinus tachycardia remains, EKG 108bpm, TWI lead III and aVF with flattening in v6 compared to prior.  -consider repeat EKG -troponins trended down this admission to 0.33 on 8/17 from 0.49 max, thought to be demand ischemia in setting of severe sepsis and respiratory failure -consider cycling enzymes, however, patient has no chest pain at this time -consider possibility  of PE(intermediate probability already based on HR) but not candidate for CTA due to renal function.   CKD 3 with hx of nephrotic syndrome--baseline Cr ?~1.5. Cr stable at 1.49 today from yesterday. Was 1.38 on admission.  -trend renal function  Normocytic anemia with hx of iron deficiency anemia--Baseline Hb ~8. Currently 8.7. On Iron at home that has been held this admission given sepsis. Iron 30, Ferritin 50, and TIBC 285 in March 2015.  Likely component of chronic anemia as well given CKD.  -trend Hb, especially post op  Encephalopathy--improving in setting of sepsis. Unclear baseline but today is alert, awake, and oriented. Speak good english, originally from [place].  Does easily appear to get anxious especially given thirst at this time and upcoming  surgery. Nursing noted may need sitter, currently he seems to be stable and not irritated or agitated. Has received fentanyl and haldol for ?delirium post extubation this admission. -Neuro checks -will have day team reassess, he is likely high risk for delirium and anxiety, especially if he has surgery -he was demanding water and calmed down and was more responsive to questions after drinking a few sips of water  Hypoalbuminemia--Albumin down to 1.8 this admission from 3.2.  Likely in setting of acute sepsis and decreased po intake.  -currently NPO but resume diet after surgery -consider SLP eval (noted to have dysphagia and was coughing, but he did do well with sips of water today that he was requesting)  DM2--well controlled, HbA1C 6.7 on admission. Hypoglycemic leading to admission with ?related seizure. CBGs <200 this admission. -monitor cbg's q4h especially while NPO  HTN--home medications include lasix 20mg , hydralazine 50mg  q8h, clonidine 0.1mg  tid, and IMDUR 30mg  qd. BP remains elevated this morning, possible in setting of anxiety.  -currently has prn order of hydralazine 20mg  q4h prn sbp >140 -consider resuming home medications once tolerating po--maybe start with clonidine (although he has been off of it for several days now) -recheck BP, did receive prn hydralazine this morning  Diet: NPO for surgery DVT Ppx: Heparin Dispo: Disposition is deferred at this time, awaiting improvement of current medical problems. Anticipated discharge in approximately 3-5 day(s).   The patient does have a current PCP Francesca Oman, DO) and does need an [facility] hospital follow-up appointment after discharge.  The patient does not know have transportation limitations that hinder transportation to clinic appointments.  Signed: Wilber Oliphant, MD 07/14/2014, 7:36 AM   Case discussed with Dr. Lynnae January, Dr. Hayes Ludwig, and Dr. Posey Pronto

## 2014-07-14 NOTE — Progress Notes (Signed)
Subjective: Pt denies any pain in the left foot.  No n/v/f/c.  On vanc and zosyn.   Objective: Vital signs in last 24 hours: Temp:  [98 F (36.7 C)-98.6 F (37 C)] 98.6 F (37 C) 2023-07-23 1205) Pulse Rate:  [78-104] 98 Jul 23, 2023 1205) Resp:  [13-19] 16 Jul 23, 2023 1205) BP: (167-191)/(85-101) 186/91 mmHg 07-23-23 1205) SpO2:  [94 %-100 %] 96 % 2023/07/23 1205) Weight:  [63.5 kg (139 lb 15.9 oz)] 63.5 kg (139 lb 15.9 oz) 07/23/23 0426)  Intake/Output from previous day: 08/19 0701 - 07-23-23 0700 In: 1938.8 [I.V.:1838.8; IV Piggyback:100] Out: 1175 [Urine:1175] Intake/Output this shift: Total I/O In: 0  Out: 250 [Urine:250]   Recent Labs  07/12/14 0239 07/13/14 0245 July 22, 2014 0009 22-Jul-2014 0440  HGB 7.8* 8.4* 8.6* 8.7*    Recent Labs  07-22-14 0009 2014/07/22 0440  WBC 14.4* 13.7*  RBC 3.17* 3.23*  HCT 26.4* 26.9*  PLT 576* 589*    Recent Labs  07-22-2014 0440 2014/07/22 1150  NA 154* 156*  K 3.3* 3.5*  CL 115* 118*  CO2 25 23  BUN 35* 32*  CREATININE 1.49* 1.41*  GLUCOSE 120* 109*  CALCIUM 9.4 9.4    Recent Labs  07/22/2014 0009  INR 1.27    PE:  left foot with wet gangrene of the medial forefoot.  Purulent drainage present.  Diminshed sens to LT at the forefoot.  5/5 strength in pF and DF of the ankle.  Assessment/Plan: L forefoot osteomyelitis and gangrene - to OR for left foot transmet amputation.  The risks and benefits of the alternative treatment options have been discussed in detail.  The patient wishes to proceed with surgery and specifically understands risks of bleeding, infection, nerve damage, blood clots, need for additional surgery, amputation and death.    Joshua [name] Jul 22, 2014, 1:41 PM

## 2014-07-14 NOTE — Anesthesia Postprocedure Evaluation (Signed)
Anesthesia Post Note  Patient: Joshua [name]  Procedure(s) Performed: Procedure(s) (LRB): TRANSMETATARSAL AMPUTATION (Left)  Anesthesia type: general  Patient location: PACU  Post pain: Pain level controlled  Post assessment: Patient's Cardiovascular Status Stable  Last Vitals:  Filed Vitals:   07/14/14 1630  BP: 167/88  Pulse: 98  Temp: 36.8 C  Resp: 10    Post vital signs: Reviewed and stable  Level of consciousness: sedated  Complications: No apparent anesthesia complications

## 2014-07-14 NOTE — Brief Op Note (Signed)
07/10/2014 - 07/14/2014  2:59 PM  PATIENT:  Joshua [name]  63 y.o. male  PRE-OPERATIVE DIAGNOSIS: 1.  Left forefoot diabetic wounds with osteomyelitis      2.  Left tight heelcord  POST-OPERATIVE DIAGNOSIS:  same  Procedure(s): 1.  Left percutaneous achilles tendon lengthening 2.  Left transmetatarsal amputation  SURGEON:  Wylene Simmer, MD  ASSISTANT:  Lorin Mercy, PA-C  ANESTHESIA:   General  EBL:  minimal   TOURNIQUET:   Total Tourniquet Time Documented: Thigh (Left) - 22 minutes Total: Thigh (Left) - 22 minutes   COMPLICATIONS:  None apparent  DISPOSITION:  Extubated, awake and stable to recovery.  DICTATION ID:  [id number]

## 2014-07-14 NOTE — Transfer of Care (Signed)
Immediate Anesthesia Transfer of Care Note  Patient: Joshua [name]  Procedure(s) Performed: Procedure(s) with comments: TRANSMETATARSAL AMPUTATION (Left) - percutaneous achilles tendon lengthening  Patient Location: PACU  Anesthesia Type:General  Level of Consciousness: sedated  Airway & Oxygen Therapy: Patient Spontanous Breathing and Patient connected to nasal cannula oxygen  Post-op Assessment: Report given to PACU RN and Post -op Vital signs reviewed and stable  Post vital signs: stable  Complications: No apparent anesthesia complications

## 2014-07-14 NOTE — Op Note (Signed)
Joshua [name], Joshua [name]              ACCOUNT NO.:  [id number]  MEDICAL RECORD NO.:  [id number]  LOCATION:  6N08C                        FACILITY:  [facility]  PHYSICIAN:  Wylene Simmer, MD        DATE OF BIRTH:  1951/11/13  DATE OF PROCEDURE:  07/14/2014 DATE OF DISCHARGE:                              OPERATIVE REPORT   PREOPERATIVE DIAGNOSIS: 1. Left forefoot diabetic dorsal and plantar ulcers with underlying     osteomyelitis. 2. Left tight heel cord.  POSTOPERATIVE DIAGNOSIS: 1. Left forefoot diabetic dorsal and plantar ulcers with underlying     osteomyelitis. 2. Left tight heel cord.  PROCEDURE: 1. Left percutaneous Achilles tendon lengthening. 2. Left transmetatarsal amputation through a separate incision.  SURGEON:  Wylene Simmer, MD  ASSISTANT:  Lorin Mercy, PA-C  ANESTHESIA:  General.  ESTIMATED BLOOD LOSS:  Minimal.  TOURNIQUET TIME:  Twenty two minutes.  COMPLICATIONS:  None apparent.  DISPOSITION:  Extubated, awake, and stable to recovery.  INDICATIONS FOR PROCEDURE:  The patient is a 63 year old male with past medical history significant for diabetes with significant peripheral neuropathy.  He is known to me from previous left forefoot I and D of his ulcers.  He was lost to followup until he was admitted earlier this week with sepsis from this recurrent left forefoot wound.  He tells me that it has never healed.  He has MRI findings of osteomyelitis at the hallux with progression of the ulcer to involve essentially the entire webspace and the skin overlying the first and second metatarsals.  He has tight heel cord on physical examination as well.  He presents now for operative treatment of this life-threatening infection.  He understands the risks and benefits, the alternative treatment options, and elects surgical treatment.  He specifically understands risks of bleeding, infection, nerve damage, blood clots, need for additional surgery, continued pain,  amputation, and death.  PROCEDURE IN DETAIL:  After preoperative consent was obtained, the correct operative site was identified.  The patient was brought to the operating room and placed supine on the operating table.  General anesthesia was induced.  The patient was already on therapeutic IV antibiotics.  A surgical time-out was taken.  Left lower extremity was prepped and draped in standard sterile fashion, tourniquet around the thigh.  The extremity was exsanguinated and tourniquet was inflated to 350 mmHg.  A triple hemisection percutaneous Achilles tendon lengthening was then performed with a #15 blade.  The ankle would then dorsiflex 15 degrees with knee extended.  A fish-mouth incision was then marked on the skin just proximal from the metatarsal heads and the wounds involving the medial forefoot.  The incision was made.  Sharp dissection was carried down through the skin, subcutaneous tissue to the level of the bone.  Subperiosteal dissection was then carried along the dorsal aspect of the metatarsals.  An oscillating saw was then used to cut through all 5 metatarsals bevelling the cuts plantarward.  The soft tissues plantarly were then released subperiosteally from the plantar aspect of the metatarsals.  The forefoot was then passed off the field as a specimen to Pathology.  Neurovascular bundles were all cauterized. The remaining soft tissues appeared  generally healthy with no evidence of necrotic tissue or purulence.  The wound was irrigated copiously. Horizontal mattress sutures of 2-0 nylon were used to close the skin incision.  Sterile dressings were applied followed by well-padded short- leg splint.  Tourniquet was released at 22 minutes after application of the dressings.  The patient was awakened from anesthesia and transported to the recovery room in stable condition.  FOLLOWUP PLAN:  The patient will be nonweightbearing on the left lower extremity.  He will be  admitted back to the Internal Medicine Teaching Service.  He will need Physical Therapy and Occupational Therapy consultations.  He will resume DVT prophylaxis tomorrow.  Lorin Mercy, PA-C was present, and scrubbed for the duration of the case.  Her assistance was essentially in gaining and maintaining exposure, performing the operation, closing dressing wounds and applying the splint.     Wylene Simmer, MD     JH/MEDQ  D:  07/14/2014  T:  07/14/2014  Job:  [id number]

## 2014-07-14 NOTE — Anesthesia Preprocedure Evaluation (Addendum)
Anesthesia Evaluation  Patient identified by MRN, date of birth, ID band Patient awake    Reviewed: Allergy & Precautions, H&P , NPO status , Patient's Chart, lab work & pertinent test results  Airway       Dental   Pulmonary former smoker,          Cardiovascular hypertension, + Peripheral Vascular Disease     Neuro/Psych Seizures -,  Anxiety  Neuromuscular disease    GI/Hepatic   Endo/Other  diabetes, Type 2  Renal/GU Renal disease     Musculoskeletal   Abdominal   Peds  Hematology  (+) anemia ,   Anesthesia Other Findings   Reproductive/Obstetrics                          Anesthesia Physical Anesthesia Plan  ASA: III  Anesthesia Plan: General   Post-op Pain Management:    Induction: Intravenous  Airway Management Planned: Oral ETT and LMA  Additional Equipment:   Intra-op Plan:   Post-operative Plan: Extubation in OR  Informed Consent: I have reviewed the patients History and Physical, chart, labs and discussed the procedure including the risks, benefits and alternatives for the proposed anesthesia with the patient or authorized representative who has indicated his/her understanding and acceptance.     Plan Discussed with: CRNA, Anesthesiologist and Surgeon  Anesthesia Plan Comments:         Anesthesia Quick Evaluation

## 2014-07-14 NOTE — Progress Notes (Signed)
SLP Cancellation Note  Patient Details Name: BURFORD WHEATLEY MRN: [id number] DOB: [birth year]   Cancelled treatment:       Reason Eval/Treat Not Completed: Patient at procedure or test/unavailable. Pt off the floor for surgery. Will return as able for bedside swallow evaluation.    Germain Osgood, M.A. CCC-SLP [phone]  Germain Osgood 07/14/2014, 2:46 PM

## 2014-07-14 NOTE — ED Provider Notes (Signed)
Medical screening examination/treatment/procedure(s) were conducted as a shared visit with non-physician practitioner(s) and myself.  I personally evaluated the patient during the encounter.   EKG Interpretation   Date/Time:  Sunday July 10 2014 20:24:12 EDT Ventricular Rate:  101 PR Interval:  169 QRS Duration: 73 QT Interval:  358 QTC Calculation: 464 R Axis:   58 Text Interpretation:  Sinus tachycardia ED PHYSICIAN INTERPRETATION  AVAILABLE IN [facility] Confirmed by TEST, Record ([id number]) on  07/12/2014 7:20:16 AM     INTUBATION Date/Time: 07/14/2014 9:11 AM Performed by: Debby Freiberg Authorized by: Debby Freiberg Consent: Verbal consent obtained. The procedure was performed in an emergent situation. Intubation method: video-assisted Patient status: paralyzed (RSI) Sedatives: etomidate Paralytic: succinylcholine Laryngoscope size: Mac 3 Tube size: 7.5 mm Tube type: cuffed Number of attempts: 1 Cricoid pressure: yes Cords visualized: yes Post-procedure assessment: chest rise and CO2 detector Breath sounds: equal Cuff inflated: yes ETT to lip: 25 cm Tube secured with: ETT holder Chest x-ray interpreted by me, other physician and radiologist. Chest x-ray findings: endotracheal tube in appropriate position Patient tolerance: Patient tolerated the procedure well with no immediate complications.  CRITICAL CARE Performed by: Debby Freiberg Authorized by: Debby Freiberg Total critical care time: 61 minutes Critical care time was exclusive of separately billable procedures and treating other patients and teaching time. Critical care was necessary to treat or prevent imminent or life-threatening deterioration of the following conditions: respiratory failure and shock. Critical care was time spent personally by me on the following activities: obtaining history from patient or surrogate, ordering and performing treatments and interventions, ordering and review of  laboratory studies, ordering and review of radiographic studies and re-evaluation of patient's condition.   (including critical care time)   I performed an examination on the patient including cardiac, pulmonary, and gi systems.   Briefly, the pt is a 63 y.o. male who presented with altered metal status awakening this morning. His family thought that maybe he was improving did not remit until this afternoon. On arrival the patient is extremely dyspneic with hypoxia, and a heart rate of 140-150 beats per minute. He is diaphoretic and ill-appearing. Initially we tried a run of BiPAP, which seemed to help the patient's symptoms. Lab work and imaging was obtained. The patient had a wound on his left great toe with surrounding erythema, which is the likely source of his septic shock.  The patient was given numerous fluid boluses in an effort to improve his heart rate.  This was unsuccessful the patient's mental status continued to decline and blood gases demonstrated increasing acidosis, which is likely due to lactic acid. The patient was intubated for airway protection. Critical care consulted and the patient was admitted to the ICU. I performed approximately 61 minutes of critical care time which is independent of any other separately billable procedures. This was necessary due to the patient's declining status, and frequent reevaluations.  Debby Freiberg, MD 07/14/14 [phone]

## 2014-07-15 ENCOUNTER — Other Ambulatory Visit: Payer: Self-pay

## 2014-07-15 ENCOUNTER — Inpatient Hospital Stay (HOSPITAL_COMMUNITY): Payer: Self-pay

## 2014-07-15 DIAGNOSIS — L0291 Cutaneous abscess, unspecified: Secondary | ICD-10-CM

## 2014-07-15 DIAGNOSIS — A4902 Methicillin resistant Staphylococcus aureus infection, unspecified site: Secondary | ICD-10-CM

## 2014-07-15 DIAGNOSIS — L0390 Cellulitis, unspecified: Secondary | ICD-10-CM

## 2014-07-15 DIAGNOSIS — J9819 Other pulmonary collapse: Secondary | ICD-10-CM

## 2014-07-15 LAB — CULTURE, ROUTINE-ABSCESS

## 2014-07-15 LAB — BASIC METABOLIC PANEL
ANION GAP: 12 (ref 5–15)
ANION GAP: 12 (ref 5–15)
Anion gap: 11 (ref 5–15)
Anion gap: 12 (ref 5–15)
BUN: 31 mg/dL — ABNORMAL HIGH (ref 6–23)
BUN: 30 mg/dL — ABNORMAL HIGH (ref 6–23)
BUN: 32 mg/dL — AB (ref 6–23)
BUN: 31 mg/dL — AB (ref 6–23)
CALCIUM: 8.4 mg/dL (ref 8.4–10.5)
CO2: 24 mEq/L (ref 19–32)
CO2: 22 mEq/L (ref 19–32)
CO2: 23 mEq/L (ref 19–32)
CO2: 23 meq/L (ref 19–32)
CREATININE: 1.56 mg/dL — AB (ref 0.50–1.35)
CREATININE: 1.45 mg/dL — AB (ref 0.50–1.35)
Calcium: 8.7 mg/dL (ref 8.4–10.5)
Calcium: 8.3 mg/dL — ABNORMAL LOW (ref 8.4–10.5)
Calcium: 8.8 mg/dL (ref 8.4–10.5)
Chloride: 112 mEq/L (ref 96–112)
Chloride: 110 mEq/L (ref 96–112)
Chloride: 113 mEq/L — ABNORMAL HIGH (ref 96–112)
Chloride: 111 mEq/L (ref 96–112)
Creatinine, Ser: 1.48 mg/dL — ABNORMAL HIGH (ref 0.50–1.35)
Creatinine, Ser: 1.49 mg/dL — ABNORMAL HIGH (ref 0.50–1.35)
GFR calc non Af Amer: 46 mL/min — ABNORMAL LOW (ref >90)
GFR calc non Af Amer: 49 mL/min — ABNORMAL LOW (ref >90)
GFR calc non Af Amer: 50 mL/min — ABNORMAL LOW (ref >90)
GFR, EST AFRICAN AMERICAN: 53 mL/min — AB (ref >90)
GFR, EST AFRICAN AMERICAN: 57 mL/min — AB (ref >90)
GFR, EST AFRICAN AMERICAN: 56 mL/min — AB (ref >90)
GFR, EST AFRICAN AMERICAN: 58 mL/min — AB (ref >90)
GFR, EST NON AFRICAN AMERICAN: 49 mL/min — AB (ref >90)
GLUCOSE: 191 mg/dL — AB (ref 70–99)
Glucose, Bld: 271 mg/dL — ABNORMAL HIGH (ref 70–99)
Glucose, Bld: 226 mg/dL — ABNORMAL HIGH (ref 70–99)
Glucose, Bld: 188 mg/dL — ABNORMAL HIGH (ref 70–99)
POTASSIUM: 3.6 meq/L — AB (ref 3.7–5.3)
POTASSIUM: 3.7 meq/L (ref 3.7–5.3)
Potassium: 3.5 mEq/L — ABNORMAL LOW (ref 3.7–5.3)
Potassium: 3.6 mEq/L — ABNORMAL LOW (ref 3.7–5.3)
SODIUM: 148 meq/L — AB (ref 137–147)
SODIUM: 146 meq/L (ref 137–147)
Sodium: 147 mEq/L (ref 137–147)
Sodium: 144 mEq/L (ref 137–147)

## 2014-07-15 LAB — CBC
HCT: 26.9 % — ABNORMAL LOW (ref 39.0–52.0)
HEMOGLOBIN: 8.2 g/dL — AB (ref 13.0–17.0)
MCH: 26.8 pg (ref 26.0–34.0)
MCHC: 30.5 g/dL (ref 30.0–36.0)
MCV: 87.9 fL (ref 78.0–100.0)
Platelets: 508 10*3/uL — ABNORMAL HIGH (ref 150–400)
RBC: 3.06 MIL/uL — ABNORMAL LOW (ref 4.22–5.81)
RDW: 16.7 % — ABNORMAL HIGH (ref 11.5–15.5)
WBC: 13.4 10*3/uL — ABNORMAL HIGH (ref 4.0–10.5)

## 2014-07-15 LAB — GLUCOSE, CAPILLARY
GLUCOSE-CAPILLARY: 169 mg/dL — AB (ref 70–99)
GLUCOSE-CAPILLARY: 171 mg/dL — AB (ref 70–99)
GLUCOSE-CAPILLARY: 223 mg/dL — AB (ref 70–99)
Glucose-Capillary: 197 mg/dL — ABNORMAL HIGH (ref 70–99)

## 2014-07-15 LAB — CLOSTRIDIUM DIFFICILE BY PCR: CDIFFPCR: NEGATIVE

## 2014-07-15 MED ORDER — LISINOPRIL 5 MG PO TABS
5.0000 mg | ORAL_TABLET | Freq: Every day | ORAL | Status: DC
  Administered 2014-07-15: 5 mg via ORAL
  Filled 2014-07-15 (×2): qty 1

## 2014-07-15 MED ORDER — HYDRALAZINE HCL 50 MG PO TABS
50.0000 mg | ORAL_TABLET | Freq: Three times a day (TID) | ORAL | Status: DC
  Administered 2014-07-15 – 2014-07-19 (×13): 50 mg via ORAL
  Filled 2014-07-15 (×3): qty 1
  Filled 2014-07-15: qty 2
  Filled 2014-07-15 (×8): qty 1
  Filled 2014-07-15: qty 2
  Filled 2014-07-15 (×5): qty 1
  Filled 2014-07-15: qty 2

## 2014-07-15 MED ORDER — FAMOTIDINE 20 MG PO TABS
20.0000 mg | ORAL_TABLET | Freq: Two times a day (BID) | ORAL | Status: DC
  Administered 2014-07-15 – 2014-07-19 (×9): 20 mg via ORAL
  Filled 2014-07-15 (×14): qty 1

## 2014-07-15 MED ORDER — ENSURE COMPLETE PO LIQD
237.0000 mL | Freq: Two times a day (BID) | ORAL | Status: DC
  Administered 2014-07-15 – 2014-07-19 (×9): 237 mL via ORAL

## 2014-07-15 NOTE — Clinical Social Work Psychosocial (Signed)
Clinical Social Work Department BRIEF PSYCHOSOCIAL ASSESSMENT 07/15/2014  Patient:  Joshua [name]     Account Number:  [id number]     Admit date:  07/10/2014  Clinical Social Worker:  Domenica Reamer, CLINICAL SOCIAL WORKER  Date/Time:  07/15/2014 10:58 AM  Referred by:  Physician  Date Referred:  07/15/2014 Referred for  SNF Placement   Other Referral:   none   Interview type:  Patient Other interview type:    PSYCHOSOCIAL DATA Living Status:  ALONE Admitted from facility:   Level of care:   Primary support name:   Primary support relationship to patient:  CHILD, ADULT Degree of support available:   Patient said that he has the support of his daughter and son who help in his home.    CURRENT CONCERNS Current Concerns  Post-Acute Placement   Other Concerns:   none at this time    SOCIAL WORK ASSESSMENT / PLAN CSW spoke with patient about potential SNF placement.  CSW explained SNF process and patient voiced that they were agreeable. Patient was agreeable to CSW sending information to SNFs in [facility] and surrounding counties.  CSW will continue to follow.   Assessment/plan status:  Psychosocial Support/Ongoing Assessment of Needs Other assessment/ plan:   FL2, PASAR   Information/referral to community resources:   SNF    PATIENT'S/FAMILY'S RESPONSE TO PLAN OF CARE: Patient was agreeable to SNF and is optimistic about his recovery process.       Domenica Reamer, [place] Social Worker [phone]

## 2014-07-15 NOTE — Evaluation (Signed)
Physical Therapy Evaluation Patient Details Name: Joshua [name] MRN: [id number] DOB: March 15, 1951 Today's Date: 07/15/2014   History of Present Illness  pt presents with L Transmetatarsal Amputation.    Clinical Impression  Pt unable to maintain NWBing on L LE despite max cueing and facilitation.  At this time feel pt will need SNF level of care at D/C.  Will continue to follow.      Follow Up Recommendations SNF    Equipment Recommendations  None recommended by PT    Recommendations for Other Services       Precautions / Restrictions Precautions Precautions: Fall Restrictions Weight Bearing Restrictions: Yes LLE Weight Bearing: Non weight bearing Other Position/Activity Restrictions: No orders for WBing, however in progress note it states NWBing.        Mobility  Bed Mobility Overal bed mobility: Needs Assistance Bed Mobility: Supine to Sit     Supine to sit: Mod assist;HOB elevated     General bed mobility comments: pt utilizes bed rails to A with coming to sit.    Transfers Overall transfer level: Needs assistance Equipment used: 1 person hand held assist Transfers: Squat Pivot Transfers     Squat pivot transfers: Mod assist     General transfer comment: pt unable to come to full standing with or without RW.  pt also unable to maintain NWBing on L LE.  cues for step-by-step through transfer.  Repeated transfer x2 for 3-in-1 use.  Nsg present and provided total A for hygiene.    Ambulation/Gait                Stairs            Wheelchair Mobility    Modified Rankin (Stroke Patients Only)       Balance Overall balance assessment: Needs assistance Sitting-balance support: No upper extremity supported;Feet supported Sitting balance-Leahy Scale: Fair     Standing balance support: Bilateral upper extremity supported;During functional activity Standing balance-Leahy Scale: Poor                               Pertinent  Vitals/Pain Pain Assessment: No/denies pain    Home Living Family/patient expects to be discharged to:: Skilled nursing facility                      Prior Function           Comments: pt indicates he had help PTA, however unsure level of A provided.       Hand Dominance   Dominant Hand: Right    Extremity/Trunk Assessment   Upper Extremity Assessment: Defer to OT evaluation           Lower Extremity Assessment: Generalized weakness;LLE deficits/detail   LLE Deficits / Details: New L Transmetatarsal amputation.    Cervical / Trunk Assessment: Normal  Communication   Communication: No difficulties  Cognition Arousal/Alertness: Awake/alert Behavior During Therapy: WFL for tasks assessed/performed Overall Cognitive Status: Within Functional Limits for tasks assessed                      General Comments      Exercises        Assessment/Plan    PT Assessment Patient needs continued PT services  PT Diagnosis Difficulty walking;Generalized weakness   PT Problem List Decreased strength;Decreased activity tolerance;Decreased balance;Decreased mobility;Decreased coordination;Decreased knowledge of use of DME;Decreased knowledge of precautions;Pain  PT Treatment  Interventions DME instruction;Gait training;Stair training;Functional mobility training;Therapeutic activities;Therapeutic exercise;Balance training;Patient/family education   PT Goals (Current goals can be found in the Care Plan section) Acute Rehab PT Goals Patient Stated Goal: None stated.   PT Goal Formulation: With patient Time For Goal Achievement: 07/29/14 Potential to Achieve Goals: Good    Frequency Min 2X/week   Barriers to discharge        Co-evaluation               End of Session Equipment Utilized During Treatment: Gait belt Activity Tolerance: Patient limited by fatigue Patient left: in chair;with call bell/phone within reach;with nursing/sitter in room Nurse  Communication: Mobility status         Time: [id number] PT Time Calculation (min): 39 min   Charges:   PT Evaluation $Initial PT Evaluation Tier I: 1 Procedure PT Treatments $Therapeutic Activity: 23-37 mins   PT G CodesCatarina Hartshorn, [place] 07/15/2014, 2:05 PM

## 2014-07-15 NOTE — Progress Notes (Signed)
  Date: 07/15/2014  Patient name: Joshua [name]  Medical record number: [id number]  Date of birth: 09/07/1951   This patient has been seen and the plan of care was discussed with the house staff. Please see their note for complete details. I concur with their findings with the following additions/corrections:  1. L foot osteo s/p transmetatarsal amputation - Dr Linus Salmons was present at bedside and rec 2 weeks abx from date of surgery - can cont Vanc now and D/C on Doxy.   2. Hypoxia - he is on Lovenox for DVT prophylaxis and it had to be held briefly for surgery but back on it. CXR negative for infiltrate or sig edema. Will do pul toilet and observe.   3. Loose stools - Pt had several stools and the Rn sent stool for C diff which was negative    Bartholomew Crews, MD 07/15/2014, 4:24 PM

## 2014-07-15 NOTE — Evaluation (Addendum)
Clinical/Bedside Swallow Evaluation Patient Details  Name: Joshua [name] MRN: [id number] Date of Birth: Sep 08, 1951  Today's Date: 07/15/2014 Time: 1040-1100 SLP Time Calculation (min): 20 min  Past Medical History:  Past Medical History  Diagnosis Date  . 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 Hypertension 10/14/2012  . Type 2 diabetes mellitus   . Diabetic nephropathy with proteinuria   . 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 Diastolic heart failure   . Cellulitis   . Convulsions   . Iron deficiency anemia   . 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 Anasarca   . Hypoalbuminemia   . Elevated alkaline phosphatase level   . Microcytic anemia   . GI bleed   . Nephrotic syndrome   . Difficulty in walking(719.7)   . Muscle weakness (generalized)   . Type II or unspecified type diabetes mellitus with renal manifestations, not stated as uncontrolled   . Iron deficiency anemia, unspecified    Past Surgical History:  Past Surgical History  Procedure Laterality Date  . Knee arthroscopy  2010    3 times, right knee  . I&d extremity Left 01/17/2014    Procedure: IRRIGATION AND DEBRIDEMENT  LEFT FOOT;  Surgeon: Wylene Simmer, MD;  Location: [facility];  Service: Orthopedics;  Laterality: Left;   HPI:  63 y/o M w/ complex history of DM, c/b diabetic nephropathy with nephrotic proteinuria, chronic non-healing diabetic foot ulcer, hx of cardiac arrest in March '15 (unknown cause) who presents w/ fever, hypoglycemia, AKI, and gap metabolic acidosis. Patient intubated 07/10/14-07/12/14.    Assessment / Plan / Recommendation Clinical Impression  Bedside Swallow Evaluation completed.  Patient presents with an unremarkable oral motor exam, good hyolaryngeal elevation and timely swallow initiation.  Patient appeared SOB throughout session and required consistent cues for pacing to decrease this.  Patient demonstrated prolonged mastication due  to increased work of breathing and required several liquids washes to reduce oral residue; as a result, recommend initiation of a Dys 3 soft solid diet with thin liquids and intermittent supervision for reminders to slow pace for RR.  SLP to follow briefly to ensure toleration.       Aspiration Risk  Mild    Diet Recommendation Thin liquid;Dysphagia 3 (Mechanical Soft)   Liquid Administration via: Straw;Cup Medication Administration: Whole meds with liquid Supervision: Patient able to self feed;Full supervision/cueing for compensatory strategies Compensations: Slow rate;Small sips/bites Postural Changes and/or Swallow Maneuvers: Out of bed for meals;Seated upright 90 degrees;Upright 30-60 min after meal    Other  Recommendations Oral Care Recommendations: Oral care BID   Follow Up Recommendations  None    Frequency and Duration min 3x week  1 week   Pertinent Vitals/Pain SOB throughout evaluation     SLP Swallow Goals See care plan    Swallow Study Prior Functional Status  Regular and thin per patient report    General Date of Onset: 07/10/14 HPI: 63 y/o M w/ complex history of DM, c/b diabetic nephropathy with nephrotic proteinuria, chronic non-healing diabetic foot ulcer, hx of cardiac arrest in March '15 (unknown cause) who presents w/ fever, hypoglycemia, AKI, and gap metabolic acidosis. Patient intubated 07/10/14-07/12/14.  Type of Study: Bedside swallow evaluation Previous Swallow Assessment: Bedside Swallow Eval 02/07/14 Diet Prior to this Study: Regular Temperature Spikes Noted: No Respiratory Status: Room air (SOB notified RN may need nasal cannula) History of Recent Intubation: Yes Length of Intubations (days): 2 days Date extubated:  07/12/14 Behavior/Cognition: Alert;Cooperative;Pleasant mood;Requires cueing Oral Cavity - Dentition: Adequate natural dentition;Missing dentition Self-Feeding Abilities: Able to feed self;Needs assist Patient Positioning: Upright in  chair Baseline Vocal Quality: Clear Volitional Cough: Strong Volitional Swallow: Able to elicit    Oral/Motor/Sensory Function Overall Oral Motor/Sensory Function: Appears within functional limits for tasks assessed   Ice Chips Ice chips: Within functional limits   Thin Liquid Thin Liquid: Within functional limits Presentation: Cup;Straw;Self Fed    Nectar Thick Nectar Thick Liquid: Not tested   Honey Thick Honey Thick Liquid: Not tested   Puree Puree: Within functional limits Presentation: Self Fed;Spoon   Solid   GO    Solid: Impaired Presentation: Self Fed Oral Phase Impairments: Impaired anterior to posterior transit Oral Phase Functional Implications: Oral residue Pharyngeal Phase Impairments: Multiple swallows;Cough - Delayed      Joshua [name], M.A., CCC-SLP [phone]  Joshua [name] 07/15/2014,11:24 AM

## 2014-07-15 NOTE — Progress Notes (Addendum)
Subjective: Yesterday, he was taken to the OR for amputation of his L transmetatarsal amputation and lengthening of his Achilles tendon.   No acute events overnight. This AM, he appeared in mild respiratory distress. O2 sats were in the 70s but increased to 90s with 2.5L O2 by [place]. CXR shows L pleural effusion stable from admission, bibasilar atelectasis, and possible worsening of CHF.   Per RN, he has had diarrhea x 3 this AM was C dif positive in prior hospitalization.   Objective: Vital signs in last 24 hours: Filed Vitals:   07/15/14 0230 07/15/14 0620 07/15/14 0714 07/15/14 1227  BP: 156/80 188/99  171/96  Pulse: 98 98  113  Temp: 98.4 F (36.9 C) 98.8 F (37.1 C)  98.8 F (37.1 C)  TempSrc: Oral Oral  Oral  Resp: 20 20  20   Height:      Weight:   157 lb 10.1 oz (71.5 kg)   SpO2: 94% 94%  97%   Weight change:   Intake/Output Summary (Last 24 hours) at 07/15/14 1317 Last data filed at 07/15/14 0901  Gross per 24 hour  Intake   3669 ml  Output    750 ml  Net   2919 ml   Vitals reviewed. General: sitting in chair, mild respiratory distress HEENT: PERRL, EOMI, no scleral icterus Cardiac: RRR, no rubs, murmurs or gallops Pulm: clear to auscultation bilaterally though exam limited by his posturing Abd: soft, nontender, nondistended, BS present Ext: L LE wrapped. R foot with intact pulses DP/PT 2+. Neuro: responds to name though believes surgery happened 4 days ago   Lab Results: Basic Metabolic Panel:  Recent Labs Lab 07/14/14 1850  07/15/14 0400 07/15/14 0530  NA 153*  < > 148* 146  K 3.7  < > 3.5* 3.6*  CL 115*  < > 113* 111  CO2 21  < > 23 23  GLUCOSE 144*  < > 191* 188*  BUN 31*  < > 31* 30*  CREATININE 1.43*  < > 1.49* 1.45*  CALCIUM 9.0  < > 8.4 8.8  MG 2.3  --   --   --   < > = values in this interval not displayed.  CBC:  Recent Labs Lab 07/12/14 0239  07/14/14 0440  07/14/14 2125 07/15/14 1016  WBC 12.9*  < > 13.7*  < > 13.6* 13.4*   NEUTROABS 9.5*  --  10.3*  --   --   --   HGB 7.8*  < > 8.7*  < > 8.3* 8.2*  HCT 24.2*  < > 26.9*  < > 26.0* 26.9*  MCV 83.4  < > 83.3  < > 85.8 87.9  PLT 446*  < > 589*  < > 569* 508*  < > = values in this interval not displayed.  CBG:  Recent Labs Lab 07/14/14 1256 07/14/14 1504 07/14/14 1805 07/14/14 2222 07/15/14 0727 07/15/14 1131  GLUCAP 90 118* 153* 228* 169* 197*    Micro Results: Recent Results (from the past 240 hour(s))  CULTURE, BLOOD (ROUTINE X 2)     Status: None   Collection Time    07/10/14  4:10 PM      Result Value Ref Range Status   Specimen Description BLOOD LEFT ARM   Final   Special Requests BOTTLES DRAWN AEROBIC AND ANAEROBIC [facility]   Final   Culture  Setup Time     Final   Value: 07/11/2014 00:56     Performed at [facility]   Culture     Final   Value:        BLOOD CULTURE RECEIVED NO GROWTH TO DATE CULTURE WILL BE HELD FOR 5 DAYS BEFORE ISSUING A FINAL NEGATIVE REPORT     Performed at [facility]   Report Status PENDING   Incomplete  URINE CULTURE     Status: None   Collection Time    07/10/14  4:31 PM      Result Value Ref Range Status   Specimen Description URINE, CLEAN CATCH   Final   Special Requests NONE   Final   Culture  Setup Time     Final   Value: 07/11/2014 02:10     Performed at [facility] Count     Final   Value: >=100,000 COLONIES/ML     Performed at [facility]   Culture     Final   Value: Multiple bacterial morphotypes present, none predominant. Suggest appropriate recollection if clinically indicated.     Performed at [facility]   Report Status 07/12/2014 FINAL   Final  CULTURE, BLOOD (ROUTINE X 2)     Status: None   Collection Time    07/10/14  4:55 PM      Result Value Ref Range Status   Specimen Description BLOOD RIGHT HAND   Final   Special Requests BOTTLES DRAWN AEROBIC ONLY 10CC   Final   Culture  Setup Time     Final   Value: 07/11/2014 00:56      Performed at [facility]   Culture     Final   Value:        BLOOD CULTURE RECEIVED NO GROWTH TO DATE CULTURE WILL BE HELD FOR 5 DAYS BEFORE ISSUING A FINAL NEGATIVE REPORT     Performed at [facility]   Report Status PENDING   Incomplete  URINE CULTURE     Status: None   Collection Time    07/10/14  7:55 PM      Result Value Ref Range Status   Specimen Description URINE, RANDOM   Final   Special Requests NONE   Final   Culture  Setup Time     Final   Value: 07/11/2014 02:08     Performed at [facility]     Final   Value: NO GROWTH     Performed at [facility]   Culture     Final   Value: NO GROWTH     Performed at [facility]   Report Status 07/12/2014 FINAL   Final  CULTURE, RESPIRATORY (NON-EXPECTORATED)     Status: None   Collection Time    07/10/14  8:12 PM      Result Value Ref Range Status   Specimen Description TRACHEAL ASPIRATE   Final   Special Requests NONE   Final   Gram Stain     Final   Value: NO WBC SEEN     NO SQUAMOUS EPITHELIAL CELLS SEEN     RARE GRAM POSITIVE COCCI     IN PAIRS     Performed at [facility]   Culture     Final   Value: Non-Pathogenic Oropharyngeal-type Flora Isolated.     Performed at [facility]   Report Status 07/13/2014 FINAL   Final  CULTURE, BLOOD (ROUTINE X 2)     Status: None   Collection Time  07/10/14  9:04 PM      Result Value Ref Range Status   Specimen Description BLOOD RIGHT ARM   Final   Special Requests BOTTLES DRAWN AEROBIC AND ANAEROBIC [facility] EACH   Final   Culture  Setup Time     Final   Value: 07/11/2014 08:51     Performed at [facility]   Culture     Final   Value:        BLOOD CULTURE RECEIVED NO GROWTH TO DATE CULTURE WILL BE HELD FOR 5 DAYS BEFORE ISSUING A FINAL NEGATIVE REPORT     Performed at [facility]   Report Status PENDING   Incomplete  MRSA PCR SCREENING     Status: Abnormal   Collection Time     07/10/14 10:16 PM      Result Value Ref Range Status   MRSA by PCR POSITIVE (*) NEGATIVE Final   Comment:            The GeneXpert MRSA Assay (FDA     approved for NASAL specimens     only), is one component of a     comprehensive MRSA colonization     surveillance program. It is not     intended to diagnose MRSA     infection nor to guide or     monitor treatment for     MRSA infections.     RESULT CALLED TO, READ BACK BY AND VERIFIED WITH:     SPERRY,A RN [id number] AT 0102 SKEEN,P  CULTURE, ROUTINE-ABSCESS     Status: None   Collection Time    07/12/14  2:41 PM      Result Value Ref Range Status   Specimen Description ABSCESS LEG   Final   Special Requests NONE   Final   Gram Stain     Final   Value: RARE WBC PRESENT, PREDOMINANTLY PMN     NO SQUAMOUS EPITHELIAL CELLS SEEN     FEW GRAM POSITIVE COCCI     IN PAIRS     Performed at [facility]   Culture     Final   Value: MODERATE METHICILLIN RESISTANT STAPHYLOCOCCUS AUREUS     Note: RIFAMPIN AND GENTAMICIN SHOULD NOT BE USED AS SINGLE DRUGS FOR TREATMENT OF STAPH INFECTIONS. This organism DOES NOT demonstrate inducible Clindamycin resistance in vitro. CRITICAL RESULT CALLED TO, READ BACK BY AND VERIFIED WITH: BREA JOHNSON @      10:12AM 07/15/14 BY DWEEKS     Performed at [facility]   Report Status 07/15/2014 FINAL   Final   Organism ID, Bacteria METHICILLIN RESISTANT STAPHYLOCOCCUS AUREUS   Final  TISSUE CULTURE     Status: None   Collection Time    07/14/14  2:34 PM      Result Value Ref Range Status   Specimen Description TISSUE LEFT FOOT   Final   Special Requests POF ZOSYN DEEP TISSUE   Final   Gram Stain     Final   Value: NO WBC SEEN     RARE SQUAMOUS EPITHELIAL CELLS PRESENT     NO ORGANISMS SEEN     Performed at [facility]   Culture PENDING   Incomplete   Report Status PENDING   Incomplete  ANAEROBIC CULTURE     Status: None   Collection Time    07/14/14  2:34 PM      Result Value  Ref Range Status   Specimen Description TISSUE  LEFT FOOT   Final   Special Requests POF ZOSYN DEEP TISSUE   Final   Gram Stain     Final   Value: NO WBC SEEN     RARE SQUAMOUS EPITHELIAL CELLS PRESENT     NO ORGANISMS SEEN     Performed at [facility]   Culture     Final   Value: NO ANAEROBES ISOLATED; CULTURE IN PROGRESS FOR 5 DAYS     Performed at [facility]   Report Status PENDING   Incomplete   Studies/Results: No results found. Medications: I have reviewed the patient's current medications. Scheduled Meds: . chlorhexidine  15 mL Mouth Rinse BID  . docusate sodium  100 mg Oral BID  . enoxaparin (LOVENOX) injection  40 mg Subcutaneous Q24H  . famotidine  20 mg Oral BID  . insulin aspart  0-9 Units Subcutaneous TID WC  . senna  1 tablet Oral BID  . vancomycin  500 mg Intravenous Q12H   Continuous Infusions: . dextrose 5 % and 0.9% NaCl 100 mL/hr at 07/15/14 0608   PRN Meds:.acetaminophen, hydrALAZINE, HYDROmorphone (DILAUDID) injection, metoCLOPramide (REGLAN) injection, metoCLOPramide, ondansetron (ZOFRAN) IV, ondansetron, oxyCODONE Assessment/Plan:  Mr. 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  #Atelectasis: Likely in the setting of prolonged bedrest given that he was intubated and recently got transferred out of the ICU.  -Continue working with PT/OT -Order incentive spirometry & pulmonary toilet  #L foot osteo s/p transmetarsal amputation: All of his electrolyte abnormalities mostly resolved following his procedure yesterday (AG acidosis). Per ID, he will need a 14-day course of antibiotics beginning today and can continue on doxycycline 100mg  BID through 9/2 as outpatient if Staph aureus is the isolate.  -Continue vancomycin (Day 6) and transition abx pending cultures -Continue checking CBC  #HTN: SBP  has trended mainly 160s-170s. He can tolerate PO intake.  -Discontinue hydralazine IV and transition to home dose -Reassess later and consider starting ACEi tonight over clonidine given his CKD with nephrotic syndrome as he has had it in the past but was held in the setting of high K -Recheck BMET tomorow   #DM2: CBGs 100s-200s likely in the setting of recovery.  -Continue CBG q4h -Encourage PO intake -Continue sensitive SSI  #CKD Stage 3: Crt stable at 1.45.   #FEN:  -D5NS @ 100cc/hr -Diet: Dyspagia 3  #DVT prophylaxis: Lovenox  #CODE STATUS: FULL CODE   Dispo: Disposition is deferred at this time, awaiting improvement of current medical problems.  Anticipated discharge in approximately 1-2 day(s).   The patient does have a current PCP Francesca Oman, DO) and does need an [facility] hospital follow-up appointment after discharge.  The patient does not know have transportation limitations that hinder transportation to clinic appointments.  .Services Needed at time of discharge: Y = Yes, Blank = No PT: SNF  OT:   RN:   Equipment:   Other:     LOS: 5 days   Charlott Rakes, MD 07/15/2014, 1:17 PM

## 2014-07-15 NOTE — Discharge Summary (Signed)
Name: Joshua [name] MRN: [id number] DOB: 07-Nov-1951 63 y.o. PCP: Francesca Oman, DO  Date of Admission: 07/10/2014  3:38 PM Date of Discharge: 07/15/2014 Attending Physician: Bartholomew Crews, MD  Discharge Diagnosis: Principal Problem:   Osteomyelitis of left foot Active Problems:   Hypertension   Diabetes mellitus with renal manifestation   Hypoalbuminemia   Chronic diastolic heart failure   Severe sepsis with acute organ dysfunction   Acute respiratory failure with hypercapnia   High anion gap metabolic acidosis  Discharge Medications:   Medication List         acetaminophen 325 MG tablet  Commonly known as:  TYLENOL  Take 325 mg by mouth every 4 (four) hours as needed for mild pain, moderate pain or fever.     aspirin 325 MG EC tablet  Take 1 tablet (325 mg total) by mouth daily.     cloNIDine 0.1 MG tablet  Commonly known as:  CATAPRES  Take 1 tablet (0.1 mg total) by mouth 3 (three) times daily.     ferrous sulfate 325 (65 FE) MG tablet  Take 1 tablet (325 mg total) by mouth 3 (three) times daily with meals.     furosemide 20 MG tablet  Commonly known as:  LASIX  Take 1 tablet (20 mg total) by mouth daily.     glimepiride 1 MG tablet  Commonly known as:  AMARYL  Take 1 tablet (1 mg total) by mouth daily with breakfast.     hydrALAZINE 50 MG tablet  Commonly known as:  APRESOLINE  Take 1 tablet (50 mg total) by mouth every 8 (eight) hours.     insulin glargine 100 UNIT/ML injection  Commonly known as:  LANTUS  Inject 0.05 mLs (5 Units total) into the skin at bedtime.     isosorbide mononitrate 30 MG 24 hr tablet  Commonly known as:  IMDUR  Take 1 tablet (30 mg total) by mouth daily.     lisinopril 20 MG tablet  Commonly known as:  PRINIVIL,ZESTRIL  Take 1 tablet (20 mg total) by mouth daily.        Disposition and follow-up:   [name] was discharged from [facility] in Stable condition.  At the hospital follow  up visit please address:  1.  Oxygen requirement  2.  HTN  3.  DM2: glycemic control  4.  GERD prophylaxis given ASA 325mg  for DVT prophylaxis  5.  Medication refill  4.  Labs / imaging needed at time of follow-up: none  5.  Pending labs/ test needing follow-up: none  Follow-up Appointments: Follow-up Information   Schedule an appointment as soon as possible for a visit to follow up.      Follow up with Wylene Simmer, MD On 07/29/2014. (9:45AM)    Specialty:  Orthopedic Surgery   Contact information:   [address] [place] [address] [place] [address] [phone]       Follow up with Duwaine Maxin, DO On 08/03/2014. (1:45PM)    Specialty:  Internal Medicine   Contact information:   [place] [address] [phone]       Discharge Instructions:   Consultations: Treatment Team:  Wylene Simmer, MD  Procedures Performed:  Dg Chest 2 View  07/15/2014   CLINICAL DATA:  Respiratory distress.  EXAM: CHEST  2 VIEW  COMPARISON:  07/13/2014.  FINDINGS: Mediastinum and hilar structures are normal. Cardiomegaly with pulmonary vascular prominence. Mild interstitial prominence. Left pleural  effusion. These findings suggest mild congestive heart failure. Basilar atelectasis. No pneumothorax. No acute bony abnormality.  IMPRESSION: 1. Mild congestive heart failure cannot be excluded. Small left pleural effusion. 2. Bibasilar atelectasis.   Electronically Signed   By: Marcello Moores  Register   On: 07/15/2014 13:20   Ct Head Wo Contrast  07/10/2014   CLINICAL DATA:  Severe headache.  EXAM: CT HEAD WITHOUT CONTRAST  TECHNIQUE: Contiguous axial images were obtained from the base of the skull through the vertex without intravenous contrast.  COMPARISON:  01/30/2014  FINDINGS: Skull and Sinuses:Nasopharyngeal fluid layer related to intubation. Inflammatory mucosal thickening in the paranasal sinuses without definitive acute sinusitis. There is diffuse reticulation of the scalp. No acute  calvarial findings.  Orbits: No acute abnormality.  Brain: No evidence of acute abnormality, such as acute infarction, hemorrhage, hydrocephalus, or mass lesion/mass effect. There is mild generalized volume loss which is age appropriate. Stable pattern of mild chronic small vessel disease, with ischemic gliosis best seen around the lateral ventricles.  IMPRESSION: 1. No evidence of acute intracranial disease. 2. Mild chronic small vessel disease.   Electronically Signed   By: Jorje Guild M.D.   On: 07/10/2014 22:16   Ct Foot Left W Contrast  07/10/2014   CLINICAL DATA:  Diffuse soft tissue swelling and open wound on the plantar aspect of the forefoot.  EXAM: CT OF THE LEFT FOOT WITH CONTRAST  TECHNIQUE: Multidetector CT imaging was performed following the standard protocol during bolus administration of intravenous contrast.  CONTRAST:  14mL OMNIPAQUE IOHEXOL 300 MG/ML  SOLN  COMPARISON:  Radiographs 07/10/2014.  FINDINGS: There is diffuse subcutaneous soft tissue swelling/ edema involving the entire foot consistent with cellulitis. This is most significant surrounding the great toe. There is an open wound on the plantar aspect of the forefoot near the level of the first metatarsal phalangeal joint. No obvious findings for septic arthritis or osteomyelitis but MRI would be much more sensitive.  There is an abscess along the dorsal and medial aspect of the proximal phalanx of the great toe measuring a maximum of 3.2 x 2.0 cm. A small amount of air is noted.  The remaining bony structures are intact.  IMPRESSION: Diffuse cellulitis most notable surrounding the great toe.  Soft tissue abscess along the dorsal and medial aspect of the proximal phalanx of the great toe measuring a maximum of 3.2 x 2.0 cm.  No definite CT findings for septic arthritis or osteomyelitis.   Electronically Signed   By: Kalman Jewels M.D.   On: 07/10/2014 22:18   Mr Foot Left Wo Contrast  07/12/2014   CLINICAL DATA:  Sepsis. Ulcer  of the great toe, query need for incision and drainage. Query osteomyelitis/abscess.  EXAM: MRI OF THE LEFT FOREFOOT WITHOUT CONTRAST  TECHNIQUE: Multiplanar, multisequence MR imaging was performed. No intravenous contrast was administered.  COMPARISON:  07/10/2014 CT scan ; MRI from 01/09/2014  FINDINGS: There is abnormal marrow edema involving the proximal and distal phalanges of the great toe and also involving the first digit sesamoids. There is associated periostitis in the appearance is compatible with osteomyelitis.  There is extensive abnormal soft tissue infiltration just plantar to the sesamoids with would appears to be a draining sinus tract to the skin versus ulceration in this vicinity. This extends down to the level of the sesamoids. I suspect that there is likely infection in this vicinity draining to the skin, rather than a walled off abscess.  Along the dorsal -lateral base of  the great toe, there is a fluid collection in the soft tissues which appears to drain to the skin along the web space region on image [id number] series 5, potentially a draining sinus tract.  Edema is present in the plantar musculature of the foot, favoring myositis.  No significant abnormal osseous edema is observed in the remainder the foot. There does appear to be some subcutaneous edema along the medial ankle.  IMPRESSION: 1. Osteomyelitis involving the first digit sesamoids and the proximal and distal phalanges of the first digit. 2. Plantar ulceration below the first digit sesamoids, with soft tissue infection in this vicinity which appears to drain to the skin rather than forming a walled off pocket. 3. A second fluid collection along the dorsal -lateral base of the great toe likewise appears to drain to the skin in the vicinity of the dorsal web space. 4. Fasciitis along the plantar musculature of the foot.   Electronically Signed   By: Sherryl Barters M.D.   On: 07/12/2014 20:14   Dg Chest Port 1 View  07/13/2014    CLINICAL DATA:  Shortness of breath  EXAM: PORTABLE CHEST - 1 VIEW  COMPARISON:  07/12/2014  FINDINGS: Interval tracheal and esophageal extubation. Pulmonary inflation remains stable, with mild atelectatic changes at the bases. There is chronic elevation of the right diaphragm. Chronic cardiomegaly. Stable upper mediastinal contours. Suspect small left pleural effusion. No edema, consolidation, or pneumothorax.  IMPRESSION: Stable pulmonary inflation after extubation.   Electronically Signed   By: Jorje Guild M.D.   On: 07/13/2014 05:30   Dg Chest Port 1 View  07/12/2014   CLINICAL DATA:  Check endotracheal tube.  EXAM: PORTABLE CHEST - 1 VIEW  COMPARISON:  07/10/2014  FINDINGS: The endotracheal tube is approximately 2.5 cm above the carina. Stable elevation of the right hemidiaphragm. Slightly improved aeration at the left lung base with residual basilar densities. Heart size is stable. No evidence for a pneumothorax. Nasogastric tube extends into the abdomen but the tip is beyond the image. Mildly improved aeration at the right lung base.  IMPRESSION: Improving aeration at both lung bases.  Support apparatuses as described.   Electronically Signed   By: Markus Daft M.D.   On: 07/12/2014 07:44   Dg Chest Portable 1 View  07/10/2014   CLINICAL DATA:  Status post intubation.  EXAM: PORTABLE CHEST - 1 VIEW  COMPARISON:  Single view of the chest 07/10/2014 at 1611 hr.  FINDINGS: Endotracheal tube is in place with tip in good position at the level of the clavicular heads. Left greater right basilar airspace disease is again seen. Opacity in the right lung base has an appearance most compatible with atelectasis. There is no pneumothorax. There may be a small left pleural effusion.  IMPRESSION: ET tube in good position.  Left worse than right basilar airspace disease. Opacity in the left base could be due to atelectasis or pneumonia. Right basilar opacity has an appearance most consistent with atelectasis.    Electronically Signed   By: Inge Rise M.D.   On: 07/10/2014 19:53   Dg Chest Port 1 View  (if Code Sepsis Called)  07/10/2014   CLINICAL DATA:  Sepsis.  Altered mental status.  EXAM: PORTABLE CHEST - 1 VIEW  COMPARISON:  CT chest 01/30/2014.  PA and lateral chest 04/13/2014.  FINDINGS: Elevation of the right hemidiaphragm is again seen. There is bibasilar airspace disease. There is also likely a small left pleural effusion. No pneumothorax identified. Heart size  is upper normal.  IMPRESSION: Bibasilar airspace disease does not appear markedly changed and is likely due to chronic atelectasis or pneumonia.  Small left pleural effusion.   Electronically Signed   By: Inge Rise M.D.   On: 07/10/2014 16:28   Dg Foot 2 Views Left  07/10/2014   CLINICAL DATA:  Diabetic foot ulcer.  EXAM: LEFT FOOT - 2 VIEW  COMPARISON:  MRI 01/09/2014.  FINDINGS: There is a small ulcer on the plantar aspect of the forefoot overlying the region of the first metatarsal phalangeal joint. There is diffuse soft tissue swelling. No definite destructive bony changes to suggest osteomyelitis.  IMPRESSION: Marked diffuse soft tissue swelling involving the forefoot, particularly surrounding the great toe.  Open wound on the plantar aspect of the first metatarsal phalangeal joint.  No definite destructive bony changes to suggest osteomyelitis.   Electronically Signed   By: Kalman Jewels M.D.   On: 07/10/2014 18:11   Admission HPI: Mr. 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 The patient had been in a SNF and was discharged home on the 3rd of this month and was doing well, per report of the patient's cousin, who lives with the patient, and who helps  take care of him. He had been able to ambulate around the house with the assistance of a cane and a walker, and had been able to perform basic ADLs (bathe, clothe, use restroom) independently. On the day of admission, the patient was confused, and became more so, and ultimately was brought to the ED. There, he was found to be febrile, tachycardic, tachypneic, and had a leukocytosis. A chemistry panel showed a gap metabolic acidosis and concominant respiratory alkaosis. His mental status declined, and the ICU was consulted. The patient then progressed to a respiratory acidosis and he was intubated for further management.   Hospital Course by problem list: #Septic shock with acute organ dysfunction 2/2 L foot osteomyelitis: He presented to the ED with altered mental status, hypoglycemic, tachycardic, tachypnic, hypertensive, febrile (101.107F). Given his level of hypoxia (84% RA), he was put on BiPAP but then got intubated after a questionable seizure. L foot CT showed diffuse cellulitis surrounding the great toe and soft tissue abscess along the dorsal/medial aspect of the proximal phalanx. Empiric treatment was started with vancomycin & Zosyn before cultures were sent (blood, urine, sputum) which were remarkable for MRSA. Troponin was 0.46 but later thought to be positive in the setting of demand ischemia. On 8/17, he was started on tube feeds. On 8/18, L foot MRI showed osteomyelitis involving the first digit and soft tissue infection in the surrounding vicinity. 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 On 8/23, his IV antibiotics were then transitioned to doxycycline 100mg  BID to complete a total 14-day course that ends on 9/2. At the time of discharge, he was afebrile.   #Anion gap metabolic acidosis: On admission, he had an anion gap of 20 which corrected with treatment of his infection. At the time of discharge, gap was  9.  #Atelectasis: On admission, CXR showed a small L pleural effusion with chronic atelectasis from possible PNA. Following extubation, he continued to desaturate and required 2.5L O2  by [place]. Repeat CXR on 8/21 showed stable L pleural effusion along with bibasilar atelectasis. His oxygen requirement persisted, and repeat CXR on 8/24 showed stable findings. Given his prior DVT, CTA was ordered which was unremarkable for PE but did show atelectasis. At the time of discharge, he required 2L O2. He will need to continue working with his incentive spirometer following discharge.   #Hypoalbuminemia: Albumin 3.2 on admission likely in the setting of his sepsis and decreased PO intake. He was tolerating a carb-modified diet at the time of discharge.   #HTN: On 8/21, he was tolerating PO intake, and home BP meds were restarted. Given his CKD & nephrotic syndrome, he was also started on lisinopril 20mg .   #DM2: After he was tolerating PO intake, he was started on sensitive SSI though CBGs trended 200s. Lantus 5 units was added, and CBGs at the time of discharge trended in 100s. He will need CBGs qAC/qHS.    Discharge Vitals:   BP 153/133  Pulse 93  Temp(Src) 98.7 F (37.1 C) (Oral)  Resp 20  Ht 5' 4.96" (1.65 m)  Wt 157 lb 10.1 oz (71.5 kg)  BMI 26.26 kg/m2  SpO2 95%  Discharge Labs:  Results for orders placed during the hospital encounter of 07/10/14 (from the past 24 hour(s))  BASIC METABOLIC PANEL     Status: Abnormal   Collection Time    07/14/14  9:25 PM      Result Value Ref Range   Sodium 150 (*) 137 - 147 mEq/L   Potassium 3.7  3.7 - 5.3 mEq/L   Chloride 114 (*) 96 - 112 mEq/L   CO2 24  19 - 32 mEq/L   Glucose, Bld 254 (*) 70 - 99 mg/dL   BUN 32 (*) 6 - 23 mg/dL   Creatinine, Ser 1.52 (*) 0.50 - 1.35 mg/dL   Calcium 8.8  8.4 - 10.5 mg/dL   GFR calc non Af Amer 47 (*) >90 mL/min   GFR calc Af Amer 55 (*) >90 mL/min   Anion gap 12  5 - 15  CBC     Status: Abnormal   Collection Time     07/14/14  9:25 PM      Result Value Ref Range   WBC 13.6 (*) 4.0 - 10.5 K/uL   RBC 3.03 (*) 4.22 - 5.81 MIL/uL   Hemoglobin 8.3 (*) 13.0 - 17.0 g/dL   HCT 26.0 (*) 39.0 - 52.0 %   MCV 85.8  78.0 - 100.0 fL   MCH 27.4  26.0 - 34.0 pg   MCHC 31.9  30.0 - 36.0 g/dL   RDW 16.8 (*) 11.5 - 15.5 %   Platelets 569 (*) 150 - 400 K/uL  GLUCOSE, CAPILLARY     Status: Abnormal   Collection Time    07/14/14 10:22 PM      Result Value Ref Range   Glucose-Capillary 228 (*) 70 - 99 mg/dL   Comment 1 Notify RN    BASIC METABOLIC PANEL     Status: Abnormal   Collection Time    07/14/14 11:47 PM      Result Value Ref Range   Sodium 147  137 - 147 mEq/L   Potassium 3.6 (*) 3.7 - 5.3 mEq/L   Chloride 112  96 - 112 mEq/L   CO2 24  19 - 32 mEq/L   Glucose, Bld 271 (*) 70 - 99 mg/dL   BUN 32 (*) 6 - 23 mg/dL   Creatinine, Ser  1.56 (*) 0.50 - 1.35 mg/dL   Calcium 8.7  8.4 - 10.5 mg/dL   GFR calc non Af Amer 46 (*) >90 mL/min   GFR calc Af Amer 53 (*) >90 mL/min   Anion gap 11  5 - 15  BASIC METABOLIC PANEL     Status: Abnormal   Collection Time    07/15/14  2:01 AM      Result Value Ref Range   Sodium 144  137 - 147 mEq/L   Potassium 3.7  3.7 - 5.3 mEq/L   Chloride 110  96 - 112 mEq/L   CO2 22  19 - 32 mEq/L   Glucose, Bld 226 (*) 70 - 99 mg/dL   BUN 31 (*) 6 - 23 mg/dL   Creatinine, Ser 1.48 (*) 0.50 - 1.35 mg/dL   Calcium 8.3 (*) 8.4 - 10.5 mg/dL   GFR calc non Af Amer 49 (*) >90 mL/min   GFR calc Af Amer 57 (*) >90 mL/min   Anion gap 12  5 - 15  BASIC METABOLIC PANEL     Status: Abnormal   Collection Time    07/15/14  4:00 AM      Result Value Ref Range   Sodium 148 (*) 137 - 147 mEq/L   Potassium 3.5 (*) 3.7 - 5.3 mEq/L   Chloride 113 (*) 96 - 112 mEq/L   CO2 23  19 - 32 mEq/L   Glucose, Bld 191 (*) 70 - 99 mg/dL   BUN 31 (*) 6 - 23 mg/dL   Creatinine, Ser 1.49 (*) 0.50 - 1.35 mg/dL   Calcium 8.4  8.4 - 10.5 mg/dL   GFR calc non Af Amer 49 (*) >90 mL/min   GFR calc Af Amer  56 (*) >90 mL/min   Anion gap 12  5 - 15  BASIC METABOLIC PANEL     Status: Abnormal   Collection Time    07/15/14  5:30 AM      Result Value Ref Range   Sodium 146  137 - 147 mEq/L   Potassium 3.6 (*) 3.7 - 5.3 mEq/L   Chloride 111  96 - 112 mEq/L   CO2 23  19 - 32 mEq/L   Glucose, Bld 188 (*) 70 - 99 mg/dL   BUN 30 (*) 6 - 23 mg/dL   Creatinine, Ser 1.45 (*) 0.50 - 1.35 mg/dL   Calcium 8.8  8.4 - 10.5 mg/dL   GFR calc non Af Amer 50 (*) >90 mL/min   GFR calc Af Amer 58 (*) >90 mL/min   Anion gap 12  5 - 15  GLUCOSE, CAPILLARY     Status: Abnormal   Collection Time    07/15/14  7:27 AM      Result Value Ref Range   Glucose-Capillary 169 (*) 70 - 99 mg/dL  CBC     Status: Abnormal   Collection Time    07/15/14 10:16 AM      Result Value Ref Range   WBC 13.4 (*) 4.0 - 10.5 K/uL   RBC 3.06 (*) 4.22 - 5.81 MIL/uL   Hemoglobin 8.2 (*) 13.0 - 17.0 g/dL   HCT 26.9 (*) 39.0 - 52.0 %   MCV 87.9  78.0 - 100.0 fL   MCH 26.8  26.0 - 34.0 pg   MCHC 30.5  30.0 - 36.0 g/dL   RDW 16.7 (*) 11.5 - 15.5 %   Platelets 508 (*) 150 - 400 K/uL  CLOSTRIDIUM DIFFICILE BY PCR  Status: None   Collection Time    07/15/14 10:22 AM      Result Value Ref Range   C difficile by pcr NEGATIVE  NEGATIVE  GLUCOSE, CAPILLARY     Status: Abnormal   Collection Time    07/15/14 11:31 AM      Result Value Ref Range   Glucose-Capillary 197 (*) 70 - 99 mg/dL  GLUCOSE, CAPILLARY     Status: Abnormal   Collection Time    07/15/14  5:21 PM      Result Value Ref Range   Glucose-Capillary 171 (*) 70 - 99 mg/dL    Signed: Charlott Rakes, MD 07/15/2014, 6:56 PM    Services Ordered on Discharge: SNF Equipment Ordered on Discharge: None

## 2014-07-15 NOTE — Evaluation (Signed)
Occupational Therapy Evaluation Patient Details Name: Joshua [name] MRN: [id number] DOB: 06/19/1951 Today's Date: 07/15/2014    History of Present Illness pt presents with L Transmetatarsal Amputation.  Pt admitted 07/10/14 with severe sepsis with acute organ dysfunction ( and intubated who presents w/ fever, hypoglycemia, AKI, and gap metabolic acidosis) and was intubated: PmHx of: DM, chronic non-healing diabetic foot ulcer, hx of cardiac arrest in March '15    Clinical Impression   This 63 yo male admitted and underwent above presents to acute OT with decreased balance, decreased mobility, NWB'in LLE, and generalized weakness all affecting his PLOF of independent. Pt will benefit from acute OT with follow up at SNF to get to a S level.    Follow Up Recommendations  SNF    Equipment Recommendations   (TBD at next venue)       Precautions / Restrictions Precautions Precautions: Fall Precaution Comments: Pt with SOB upon arrival (I checked O2 and pt was 98% on 3 liters) removed O2 and on RA pt was 87% at rest Restrictions Weight Bearing Restrictions: Yes LLE Weight Bearing: Non weight bearing Other Position/Activity Restrictions: No orders for WBing, however in progress note it states NWBing.        Mobility Bed Mobility Overal bed mobility: Needs Assistance Bed Mobility: Supine to Sit     Supine to sit: Mod assist;HOB elevated     General bed mobility comments: pt utilizes bed rails to A with coming to sit.    Transfers Overall transfer level: Needs assistance Equipment used: 1 person hand held assist Transfers: Squat Pivot Transfers     Squat pivot transfers: Max assist (going to patients left)     General transfer comment: Pt not able to maintain NWB with squat pivot    Balance Overall balance assessment: Needs assistance Sitting-balance support: No upper extremity supported;Feet supported Sitting balance-Leahy Scale: Fair     Standing balance support:  Bilateral upper extremity supported;During functional activity Standing balance-Leahy Scale: Poor                              ADL Overall ADL's : Needs assistance/impaired Eating/Feeding: Independent;Sitting   Grooming: Set up;Sitting   Upper Body Bathing: Set up;Sitting   Lower Body Bathing: Maximal assistance;Sitting/lateral leans   Upper Body Dressing : Minimal assistance;Sitting   Lower Body Dressing: Total assistance;Sitting/lateral leans   Toilet Transfer: Maximal assistance;Squat-pivot (bed to recliner going to patient's right)   Toileting- Clothing Manipulation and Hygiene: Total assistance;Sitting/lateral lean                         Pertinent Vitals/Pain Pain Assessment: No/denies pain     Hand Dominance Right   Extremity/Trunk Assessment Upper Extremity Assessment Upper Extremity Assessment: Generalized weakness     Communication Communication Communication: No difficulties   Cognition Arousal/Alertness: Awake/alert Behavior During Therapy: WFL for tasks assessed/performed Overall Cognitive Status: Within Functional Limits for tasks assessed                                Home Living Family/patient expects to be discharged to:: Skilled nursing facility                                        Prior Functioning/Environment  Comments: pt indicates he had help PTA, however unsure level of A provided.      OT Diagnosis: Generalized weakness   OT Problem List: Decreased strength;Impaired balance (sitting and/or standing);Decreased knowledge of precautions   OT Treatment/Interventions: Self-care/ADL training;Patient/family education;DME and/or AE instruction;Balance training    OT Goals(Current goals can be found in the care plan section) Acute Rehab OT Goals Patient Stated Goal: I did not ask Time For Goal Achievement: 07/29/14 Potential to Achieve Goals: Fair  OT Frequency: Min 2X/week (3  would be better if at all possible due to no insurance)   Barriers to D/C: Decreased caregiver support             End of Session    Activity Tolerance: Patient tolerated treatment well Patient left: in chair;with call bell/phone within reach   Time: 1436-1510 OT Time Calculation (min): 34 min Charges:  OT General Charges $OT Visit: 1 Procedure OT Treatments $Self Care/Home Management : 23-37 mins  Almon Register [id number] 07/15/2014, 4:36 PM

## 2014-07-15 NOTE — Progress Notes (Signed)
Pt has had 3 stools today. C-Diff Pcr sent to lab.

## 2014-07-15 NOTE — Progress Notes (Signed)
NUTRITION FOLLOW UP  Intervention:   Ensure Complete po BID, each supplement provides 350 kcal and 13 grams of protein  Nutrition Dx:   Inadequate oral intake related to inability to eat as evidenced by NPO status; improved  Goal:   Pt to meet >/= 90% of their estimated nutrition needs; not met but improving  Monitor:   Weight trend, po intake, acceptance of supplements, labs  Assessment:   62 y/o Male w/complex history of DM, c/b diabetic nephropathy with nephrotic proteinuria, chronic non-healing diabetic foot ulcer, hx of cardiac arrest in March '15 (unknown cause) who presented w/ fever, hypoglycemia, AKI, and gap metabolic acidosis.  - 8/20 Pt s/p: Procedure(s) Performed: Procedure(s) (LRB):  TRANSMETATARSAL AMPUTATION (Left)  - Pt reports that his usual body weight is around 150 lbs "when he is eating good." He says that his appetite is improved, but is not back to normal. Pt said that he would benefit from nutritional supplements.  Labs: CBGs: 118-228 Na WNL K low BUN elevated  Height: Ht Readings from Last 1 Encounters:  07/10/14 5' 4.96" (1.65 m)    Weight Status:   Wt Readings from Last 1 Encounters:  07/15/14 157 lb 10.1 oz (71.5 kg)    Re-estimated needs:  Kcal: 1950-2200 Protein: 105-120 g Fluid: 2.2 L/day  Skin: closed incision on left foot  Diet Order: Dysphagia   Intake/Output Summary (Last 24 hours) at 07/15/14 1336 Last data filed at 07/15/14 0901  Gross per 24 hour  Intake   3669 ml  Output    750 ml  Net   2919 ml    Last BM: 8/21   Labs:   Recent Labs Lab 07/14/14 1850  07/15/14 0201 07/15/14 0400 07/15/14 0530  NA 153*  < > 144 148* 146  K 3.7  < > 3.7 3.5* 3.6*  CL 115*  < > 110 113* 111  CO2 21  < > '22 23 23  ' BUN 31*  < > 31* 31* 30*  CREATININE 1.43*  < > 1.48* 1.49* 1.45*  CALCIUM 9.0  < > 8.3* 8.4 8.8  MG 2.3  --   --   --   --   GLUCOSE 144*  < > 226* 191* 188*  < > = values in this interval not  displayed.  CBG (last 3)   Recent Labs  07/14/14 2222 07/15/14 0727 07/15/14 1131  GLUCAP 228* 169* 197*    Scheduled Meds: . chlorhexidine  15 mL Mouth Rinse BID  . docusate sodium  100 mg Oral BID  . enoxaparin (LOVENOX) injection  40 mg Subcutaneous Q24H  . famotidine  20 mg Oral BID  . insulin aspart  0-9 Units Subcutaneous TID WC  . senna  1 tablet Oral BID  . vancomycin  500 mg Intravenous Q12H    Continuous Infusions: . dextrose 5 % and 0.9% NaCl 100 mL/hr at 07/15/14 1282    Terrace Arabia RD, LDN

## 2014-07-15 NOTE — Progress Notes (Addendum)
[facility]  Date of Admission:  07/10/2014  Antibiotics: vanconmycin and zosyn  Subjective: No complaints  Objective: Temp:  [97.5 F (36.4 C)-98.8 F (37.1 C)] 98.8 F (37.1 C) (08/21 0620) Pulse Rate:  [92-106] 98 (08/21 0620) Resp:  [9-20] 20 (08/21 0620) BP: (141-188)/(71-99) 188/99 mmHg (08/21 0620) SpO2:  [89 %-97 %] 94 % (08/21 0620) Weight:  [157 lb 10.1 oz (71.5 kg)] 157 lb 10.1 oz (71.5 kg) (08/21 0714)  General: awake, alert Ext: left foot wrapped  Lab Results Lab Results  Component Value Date   WBC 13.4* 07/15/2014   HGB 8.2* 07/15/2014   HCT 26.9* 07/15/2014   MCV 87.9 07/15/2014   PLT 508* 07/15/2014    Lab Results  Component Value Date   CREATININE 1.45* 07/15/2014   BUN 30* 07/15/2014   NA 146 07/15/2014   K 3.6* 07/15/2014   CL 111 07/15/2014   CO2 23 07/15/2014    Lab Results  Component Value Date   ALT 26 07/12/2014   AST 24 07/12/2014   ALKPHOS 161* 07/12/2014   BILITOT 0.5 07/12/2014      Microbiology: Recent Results (from the past 240 hour(s))  CULTURE, BLOOD (ROUTINE X 2)     Status: None   Collection Time    07/10/14  4:10 PM      Result Value Ref Range Status   Specimen Description BLOOD LEFT ARM   Final   Special Requests BOTTLES DRAWN AEROBIC AND ANAEROBIC 6CC   Final   Culture  Setup Time     Final   Value: 07/11/2014 00:56     Performed at [facility]   Culture     Final   Value:        BLOOD CULTURE RECEIVED NO GROWTH TO DATE CULTURE WILL BE HELD FOR 5 DAYS BEFORE ISSUING A FINAL NEGATIVE REPORT     Performed at [facility]   Report Status PENDING   Incomplete  URINE CULTURE     Status: None   Collection Time    07/10/14  4:31 PM      Result Value Ref Range Status   Specimen Description URINE, CLEAN CATCH   Final   Special Requests NONE   Final   Culture  Setup Time     Final   Value: 07/11/2014 02:10     Performed at [facility]     Final   Value:  >=100,000 COLONIES/ML     Performed at [facility]   Culture     Final   Value: Multiple bacterial morphotypes present, none predominant. Suggest appropriate recollection if clinically indicated.     Performed at [facility]   Report Status 07/12/2014 FINAL   Final  CULTURE, BLOOD (ROUTINE X 2)     Status: None   Collection Time    07/10/14  4:55 PM      Result Value Ref Range Status   Specimen Description BLOOD RIGHT HAND   Final   Special Requests BOTTLES DRAWN AEROBIC ONLY 10CC   Final   Culture  Setup Time     Final   Value: 07/11/2014 00:56     Performed at [facility]   Culture     Final   Value:        BLOOD CULTURE RECEIVED NO GROWTH TO DATE CULTURE WILL BE HELD FOR 5 DAYS BEFORE ISSUING A FINAL NEGATIVE REPORT  Performed at [facility]   Report Status PENDING   Incomplete  URINE CULTURE     Status: None   Collection Time    07/10/14  7:55 PM      Result Value Ref Range Status   Specimen Description URINE, RANDOM   Final   Special Requests NONE   Final   Culture  Setup Time     Final   Value: 07/11/2014 02:08     Performed at [place]     Final   Value: NO GROWTH     Performed at [facility]   Culture     Final   Value: NO GROWTH     Performed at [facility]   Report Status 07/12/2014 FINAL   Final  CULTURE, RESPIRATORY (NON-EXPECTORATED)     Status: None   Collection Time    07/10/14  8:12 PM      Result Value Ref Range Status   Specimen Description TRACHEAL ASPIRATE   Final   Special Requests NONE   Final   Gram Stain     Final   Value: NO WBC SEEN     NO SQUAMOUS EPITHELIAL CELLS SEEN     RARE GRAM POSITIVE COCCI     IN PAIRS     Performed at [facility]   Culture     Final   Value: Non-Pathogenic Oropharyngeal-type Flora Isolated.     Performed at [facility]   Report Status 07/13/2014 FINAL   Final  CULTURE, BLOOD (ROUTINE X 2)     Status: None    Collection Time    07/10/14  9:04 PM      Result Value Ref Range Status   Specimen Description BLOOD RIGHT ARM   Final   Special Requests BOTTLES DRAWN AEROBIC AND ANAEROBIC [facility] EACH   Final   Culture  Setup Time     Final   Value: 07/11/2014 08:51     Performed at [facility]   Culture     Final   Value:        BLOOD CULTURE RECEIVED NO GROWTH TO DATE CULTURE WILL BE HELD FOR 5 DAYS BEFORE ISSUING A FINAL NEGATIVE REPORT     Performed at [facility]   Report Status PENDING   Incomplete  MRSA PCR SCREENING     Status: Abnormal   Collection Time    07/10/14 10:16 PM      Result Value Ref Range Status   MRSA by PCR POSITIVE (*) NEGATIVE Final   Comment:            The GeneXpert MRSA Assay (FDA     approved for NASAL specimens     only), is one component of a     comprehensive MRSA colonization     surveillance program. It is not     intended to diagnose MRSA     infection nor to guide or     monitor treatment for     MRSA infections.     RESULT CALLED TO, READ BACK BY AND VERIFIED WITH:     SPERRY,A RN [id number] AT 0102 SKEEN,P  CULTURE, ROUTINE-ABSCESS     Status: None   Collection Time    07/12/14  2:41 PM      Result Value Ref Range Status   Specimen Description ABSCESS LEG   Final   Special Requests NONE   Final   Gram  Stain     Final   Value: RARE WBC PRESENT, PREDOMINANTLY PMN     NO SQUAMOUS EPITHELIAL CELLS SEEN     FEW GRAM POSITIVE COCCI     IN PAIRS     Performed at [facility]   Culture     Final   Value: MODERATE METHICILLIN RESISTANT STAPHYLOCOCCUS AUREUS     Note: RIFAMPIN AND GENTAMICIN SHOULD NOT BE USED AS SINGLE DRUGS FOR TREATMENT OF STAPH INFECTIONS. This organism DOES NOT demonstrate inducible Clindamycin resistance in vitro. CRITICAL RESULT CALLED TO, READ BACK BY AND VERIFIED WITH: BREA JOHNSON @      10:12AM 07/15/14 BY DWEEKS     Performed at [facility]   Report Status 07/15/2014 FINAL   Final   Organism ID,  Bacteria METHICILLIN RESISTANT STAPHYLOCOCCUS AUREUS   Final  TISSUE CULTURE     Status: None   Collection Time    07/14/14  2:34 PM      Result Value Ref Range Status   Specimen Description TISSUE LEFT FOOT   Final   Special Requests POF ZOSYN DEEP TISSUE   Final   Gram Stain PENDING   Incomplete   Culture PENDING   Incomplete   Report Status PENDING   Incomplete  ANAEROBIC CULTURE     Status: None   Collection Time    07/14/14  2:34 PM      Result Value Ref Range Status   Specimen Description TISSUE LEFT FOOT   Final   Special Requests POF ZOSYN DEEP TISSUE   Final   Gram Stain PENDING   Incomplete   Culture PENDING   Incomplete   Report Status PENDING   Incomplete    Studies/Results: No results found.  Assessment/Plan: 1)  Osteomyelitis - confirmed on MRI.  Amputation done.  Op report reviewed.  MRSA in culture of abscess.  Tissue culture pending but with history of SA and current culture, I certainly suspect this is the bacteria.  He can continue with vancomycin in the hospital and go home on DOXYCYCLINE 100 mg (Bactrim resistant) bid through 9/2 and stop.    I will sign off, please call with questions.  thanks  Scharlene Gloss, [place] [place] [email or web address] [phone] pager   [phone] cell 07/15/2014, 11:07 AM

## 2014-07-15 NOTE — Progress Notes (Signed)
Subjective: 1 Day Post-Op Procedure(s) (LRB): TRANSMETATARSAL AMPUTATION (Left) Patient reports pain as mild.  He states that he is feeling very well this morning and has "no pain." He has no new complaints or concerns at this time. He denies any new HA, CP, SOB, N/V, fever, chills, calf pain or swelling. He has a normal appetite and appropriate BMs. He still has a foley catheter in place.   Objective: Vital signs in last 24 hours: Temp:  [97.5 F (36.4 C)-98.8 F (37.1 C)] 98.8 F (37.1 C) (08/21 0620) Pulse Rate:  [92-106] 98 (08/21 0620) Resp:  [9-20] 20 (08/21 0620) BP: (141-188)/(71-101) 188/99 mmHg (08/21 0620) SpO2:  [89 %-97 %] 94 % (08/21 0620) Weight:  [71.5 kg (157 lb 10.1 oz)] 71.5 kg (157 lb 10.1 oz) (08/21 0714)  Intake/Output from previous day: 08/20 0701 - 08/21 0700 In: 3669 [P.O.:1200; I.V.:2169; IV Piggyback:300] Out: 601 [Urine:600; Stool:1] Intake/Output this shift:     Recent Labs  07/13/14 0245 07/14/14 0009 07/14/14 0440 07/14/14 1850 07/14/14 2125  HGB 8.4* 8.6* 8.7* 8.1* 8.3*    Recent Labs  07/14/14 1850 07/14/14 2125  WBC 12.7* 13.6*  RBC 2.97* 3.03*  HCT 25.5* 26.0*  PLT 552* 569*    Recent Labs  07/15/14 0400 07/15/14 0530  NA 148* 146  K 3.5* 3.6*  CL 113* 111  CO2 23 23  BUN 31* 30*  CREATININE 1.49* 1.45*  GLUCOSE 191* 188*  CALCIUM 8.4 8.8    Recent Labs  07/14/14 0009  INR 1.27    Physical Exam: he is a WD/WN male in nad, resting comfortably in bed. A and O x4. Mood and affect are appropriate, very pleasant this morning. EOMI. Respirations normal and unlabored with [place]. LLE in hard splint. Dressings are C/D/I. No obvious calf swelling. Fair quadriceps contraction on the left. Distal sensation and pulses cannot be assessed due to splint, good peripheral pulses. No lymphadenopathy.  Assessment/Plan: 1 Day Post-Op Procedure(s) (LRB): TRANSMETATARSAL AMPUTATION (Left) Up with therapy, NWB LLE -Continue PO narcotics  for pain relief -Lovenox for DVT prophylaxis  -CM and SW consults -Continue treatment per internal medicine team recs  Joshua [name] 07/15/2014, 7:27 AM

## 2014-07-16 LAB — CBC
HCT: 24 % — ABNORMAL LOW (ref 39.0–52.0)
Hemoglobin: 7.6 g/dL — ABNORMAL LOW (ref 13.0–17.0)
MCH: 27.6 pg (ref 26.0–34.0)
MCHC: 31.7 g/dL (ref 30.0–36.0)
MCV: 87.3 fL (ref 78.0–100.0)
PLATELETS: 506 10*3/uL — AB (ref 150–400)
RBC: 2.75 MIL/uL — AB (ref 4.22–5.81)
RDW: 16.7 % — AB (ref 11.5–15.5)
WBC: 12 10*3/uL — AB (ref 4.0–10.5)

## 2014-07-16 LAB — BASIC METABOLIC PANEL WITH GFR
Anion gap: 13 (ref 5–15)
BUN: 19 mg/dL (ref 6–23)
CO2: 22 meq/L (ref 19–32)
Calcium: 8.6 mg/dL (ref 8.4–10.5)
Chloride: 110 meq/L (ref 96–112)
Creatinine, Ser: 1.27 mg/dL (ref 0.50–1.35)
GFR calc Af Amer: 68 mL/min — ABNORMAL LOW
GFR calc non Af Amer: 59 mL/min — ABNORMAL LOW
Glucose, Bld: 161 mg/dL — ABNORMAL HIGH (ref 70–99)
Potassium: 4.2 meq/L (ref 3.7–5.3)
Sodium: 145 meq/L (ref 137–147)

## 2014-07-16 LAB — GLUCOSE, CAPILLARY
Glucose-Capillary: 224 mg/dL — ABNORMAL HIGH (ref 70–99)
Glucose-Capillary: 232 mg/dL — ABNORMAL HIGH (ref 70–99)
Glucose-Capillary: 240 mg/dL — ABNORMAL HIGH (ref 70–99)
Glucose-Capillary: 252 mg/dL — ABNORMAL HIGH (ref 70–99)

## 2014-07-16 MED ORDER — LISINOPRIL 20 MG PO TABS: 20.0000 mg | ORAL_TABLET | Freq: Every day | ORAL | Status: DC

## 2014-07-16 MED ORDER — LABETALOL HCL 5 MG/ML IV SOLN
10.0000 mg | Freq: Once | INTRAVENOUS | Status: AC
  Administered 2014-07-16: 10 mg via INTRAVENOUS
  Filled 2014-07-16: qty 4

## 2014-07-16 MED ORDER — LISINOPRIL 20 MG PO TABS
20.0000 mg | ORAL_TABLET | Freq: Every day | ORAL | Status: DC
  Administered 2014-07-16 – 2014-07-19 (×4): 20 mg via ORAL
  Filled 2014-07-16 (×4): qty 1

## 2014-07-16 NOTE — Progress Notes (Signed)
Orthopedics Progress Note  Subjective: Pain well controleld  Objective:  Filed Vitals:   07/16/14 0632  BP: 182/122  Pulse: 97  Temp:   Resp:     General: Awake and alert  Musculoskeletal: left LE splint intact, foot elevated Neurovascularly intact  Lab Results  Component Value Date   WBC 13.4* 07/15/2014   HGB 8.2* 07/15/2014   HCT 26.9* 07/15/2014   MCV 87.9 07/15/2014   PLT 508* 07/15/2014       Component Value Date/Time   NA 146 07/15/2014 0530   K 3.6* 07/15/2014 0530   CL 111 07/15/2014 0530   CO2 23 07/15/2014 0530   GLUCOSE 188* 07/15/2014 0530   BUN 30* 07/15/2014 0530   CREATININE 1.45* 07/15/2014 0530   CREATININE 1.35 07/06/2014 1606   CALCIUM 8.8 07/15/2014 0530   GFRNONAA 50* 07/15/2014 0530   GFRNONAA 56* 07/06/2014 1606   GFRAA 58* 07/15/2014 0530   GFRAA 65 07/06/2014 1606    Lab Results  Component Value Date   INR 1.27 07/14/2014   INR 1.29 07/10/2014   INR 1.76* 04/18/2014    Assessment/Plan: POD #2 s/p Procedure(s): TRANSMETATARSAL AMPUTATION Stable from ortho standpoint D/C planning  Remo Lipps R. Veverly Fells, MD 07/16/2014 7:35 AM

## 2014-07-16 NOTE — Progress Notes (Addendum)
Subjective: His pain is well controlled. He denies CP, SOB, or constipation. His appetite is improving.  He had BP elevated to 180s/120s overnight but improved gradually to 150s/90s with hydralazine PO, Labetalol 10mg  IV, and lisinopril 20mg .   Objective: Vital signs in last 24 hours: Filed Vitals:   07/16/14 0542 07/16/14 0632 07/16/14 0735 07/16/14 0810  BP: 180/104 182/122 182/120 150/90  Pulse: 98 97    Temp: 98 F (36.7 C)     TempSrc:      Resp: 20     Height:      Weight: 170 lb 10.2 oz (77.4 kg)     SpO2: 97% 97%     Weight change:   Intake/Output Summary (Last 24 hours) at 07/16/14 1233 Last data filed at 07/16/14 1058  Gross per 24 hour  Intake 3136.67 ml  Output   1125 ml  Net 2011.67 ml   Vitals reviewed.  General: sitting in bed, in NAD  HEENT:  no scleral icterus  Cardiac: RRR, no rubs, murmurs or gallops  Pulm: clear to auscultation bilaterally though exam limited Abd: soft, nontender, nondistended, BS present  Ext: L LE wrapped. R foot with intact pulses DP/PT 2+.  Neuro: responds to name though believes surgery happened 4 days ago  Lab Results: Basic Metabolic Panel:  Recent Labs Lab 07/14/14 1850  07/15/14 0530 07/16/14 0610  NA 153*  < > 146 145  K 3.7  < > 3.6* 4.2  CL 115*  < > 111 110  CO2 21  < > 23 22  GLUCOSE 144*  < > 188* 161*  BUN 31*  < > 30* 19  CREATININE 1.43*  < > 1.45* 1.27  CALCIUM 9.0  < > 8.8 8.6  MG 2.3  --   --   --   < > = values in this interval not displayed. Liver Function Tests:  Recent Labs Lab 07/10/14 2109 07/12/14 0239  AST 34 24  ALT 33 26  ALKPHOS 237* 161*  BILITOT 0.7 0.5  PROT 7.1 6.5  ALBUMIN 2.2* 1.8*   CBC:  Recent Labs Lab 07/12/14 0239  07/14/14 0440  07/15/14 1016 07/16/14 0610  WBC 12.9*  < > 13.7*  < > 13.4* 12.0*  NEUTROABS 9.5*  --  10.3*  --   --   --   HGB 7.8*  < > 8.7*  < > 8.2* 7.6*  HCT 24.2*  < > 26.9*  < > 26.9* 24.0*  MCV 83.4  < > 83.3  < > 87.9 87.3  PLT 446*   < > 589*  < > 508* 506*  < > = values in this interval not displayed. Cardiac Enzymes:  Recent Labs Lab 07/11/14 0537 07/11/14 1037 07/14/14 1150  TROPONINI 0.44* 0.33* <0.30   CBG:  Recent Labs Lab 07/15/14 0727 07/15/14 1131 07/15/14 1721 07/15/14 2137 07/16/14 0743 07/16/14 1156  GLUCAP 169* 197* 171* 223* 224* 232*   Hemoglobin A1C:  Recent Labs Lab 07/10/14 2109  HGBA1C 6.7*   Fasting Lipid Panel:  Recent Labs Lab 07/12/14 0239  TRIG 163*   Coagulation:  Recent Labs Lab 07/10/14 2109 07/14/14 0009  LABPROT 16.1* 15.9*  INR 1.29 1.27   Urinalysis:  Recent Labs Lab 07/10/14 1631 07/10/14 1955  COLORURINE YELLOW YELLOW  LABSPEC 1.025 1.024  PHURINE 5.0 5.0  GLUCOSEU 100* 100*  HGBUR TRACE* SMALL*  BILIRUBINUR NEGATIVE NEGATIVE  KETONESUR NEGATIVE NEGATIVE  PROTEINUR >300* >300*  UROBILINOGEN 0.2 0.2  NITRITE  NEGATIVE NEGATIVE  LEUKOCYTESUR NEGATIVE NEGATIVE    Micro Results: Recent Results (from the past 240 hour(s))  CULTURE, BLOOD (ROUTINE X 2)     Status: None   Collection Time    07/10/14  4:10 PM      Result Value Ref Range Status   Specimen Description BLOOD LEFT ARM   Final   Special Requests BOTTLES DRAWN AEROBIC AND ANAEROBIC 6CC   Final   Culture  Setup Time     Final   Value: 07/11/2014 00:56     Performed at [facility]   Culture     Final   Value:        BLOOD CULTURE RECEIVED NO GROWTH TO DATE CULTURE WILL BE HELD FOR 5 DAYS BEFORE ISSUING A FINAL NEGATIVE REPORT     Performed at [facility]   Report Status PENDING   Incomplete  URINE CULTURE     Status: None   Collection Time    07/10/14  4:31 PM      Result Value Ref Range Status   Specimen Description URINE, CLEAN CATCH   Final   Special Requests NONE   Final   Culture  Setup Time     Final   Value: 07/11/2014 02:10     Performed at [facility]     Final   Value: >=100,000 COLONIES/ML     Performed at [facility]   Culture     Final   Value: Multiple bacterial morphotypes present, none predominant. Suggest appropriate recollection if clinically indicated.     Performed at [facility]   Report Status 07/12/2014 FINAL   Final  CULTURE, BLOOD (ROUTINE X 2)     Status: None   Collection Time    07/10/14  4:55 PM      Result Value Ref Range Status   Specimen Description BLOOD RIGHT HAND   Final   Special Requests BOTTLES DRAWN AEROBIC ONLY 10CC   Final   Culture  Setup Time     Final   Value: 07/11/2014 00:56     Performed at [facility]   Culture     Final   Value:        BLOOD CULTURE RECEIVED NO GROWTH TO DATE CULTURE WILL BE HELD FOR 5 DAYS BEFORE ISSUING A FINAL NEGATIVE REPORT     Performed at [facility]   Report Status PENDING   Incomplete  URINE CULTURE     Status: None   Collection Time    07/10/14  7:55 PM      Result Value Ref Range Status   Specimen Description URINE, RANDOM   Final   Special Requests NONE   Final   Culture  Setup Time     Final   Value: 07/11/2014 02:08     Performed at [place]     Final   Value: NO GROWTH     Performed at [facility]   Culture     Final   Value: NO GROWTH     Performed at [facility]   Report Status 07/12/2014 FINAL   Final  CULTURE, RESPIRATORY (NON-EXPECTORATED)     Status: None   Collection Time    07/10/14  8:12 PM      Result Value Ref Range Status   Specimen Description TRACHEAL ASPIRATE   Final   Special Requests NONE  Final   Gram Stain     Final   Value: NO WBC SEEN     NO SQUAMOUS EPITHELIAL CELLS SEEN     RARE GRAM POSITIVE COCCI     IN PAIRS     Performed at [facility]   Culture     Final   Value: Non-Pathogenic Oropharyngeal-type Flora Isolated.     Performed at [facility]   Report Status 07/13/2014 FINAL   Final  CULTURE, BLOOD (ROUTINE X 2)     Status: None   Collection Time    07/10/14  9:04 PM      Result Value  Ref Range Status   Specimen Description BLOOD RIGHT ARM   Final   Special Requests BOTTLES DRAWN AEROBIC AND ANAEROBIC [facility] EACH   Final   Culture  Setup Time     Final   Value: 07/11/2014 08:51     Performed at [facility]   Culture     Final   Value:        BLOOD CULTURE RECEIVED NO GROWTH TO DATE CULTURE WILL BE HELD FOR 5 DAYS BEFORE ISSUING A FINAL NEGATIVE REPORT     Performed at [facility]   Report Status PENDING   Incomplete  MRSA PCR SCREENING     Status: Abnormal   Collection Time    07/10/14 10:16 PM      Result Value Ref Range Status   MRSA by PCR POSITIVE (*) NEGATIVE Final   Comment:            The GeneXpert MRSA Assay (FDA     approved for NASAL specimens     only), is one component of a     comprehensive MRSA colonization     surveillance program. It is not     intended to diagnose MRSA     infection nor to guide or     monitor treatment for     MRSA infections.     RESULT CALLED TO, READ BACK BY AND VERIFIED WITH:     SPERRY,A RN [id number] AT 0102 SKEEN,P  CULTURE, ROUTINE-ABSCESS     Status: None   Collection Time    07/12/14  2:41 PM      Result Value Ref Range Status   Specimen Description ABSCESS LEG   Final   Special Requests NONE   Final   Gram Stain     Final   Value: RARE WBC PRESENT, PREDOMINANTLY PMN     NO SQUAMOUS EPITHELIAL CELLS SEEN     FEW GRAM POSITIVE COCCI     IN PAIRS     Performed at [facility]   Culture     Final   Value: MODERATE METHICILLIN RESISTANT STAPHYLOCOCCUS AUREUS     Note: RIFAMPIN AND GENTAMICIN SHOULD NOT BE USED AS SINGLE DRUGS FOR TREATMENT OF STAPH INFECTIONS. This organism DOES NOT demonstrate inducible Clindamycin resistance in vitro. CRITICAL RESULT CALLED TO, READ BACK BY AND VERIFIED WITH: BREA JOHNSON @      10:12AM 07/15/14 BY DWEEKS     Performed at [facility]   Report Status 07/15/2014 FINAL   Final   Organism ID, Bacteria METHICILLIN RESISTANT STAPHYLOCOCCUS AUREUS   Final    TISSUE CULTURE     Status: None   Collection Time    07/14/14  2:34 PM      Result Value Ref Range Status   Specimen Description TISSUE LEFT FOOT   Final  Special Requests POF ZOSYN DEEP TISSUE   Final   Gram Stain     Final   Value: NO WBC SEEN     RARE SQUAMOUS EPITHELIAL CELLS PRESENT     NO ORGANISMS SEEN     Performed at [facility]   Culture     Final   Value: Culture reincubated for better growth     Performed at [facility]   Report Status PENDING   Incomplete  ANAEROBIC CULTURE     Status: None   Collection Time    07/14/14  2:34 PM      Result Value Ref Range Status   Specimen Description TISSUE LEFT FOOT   Final   Special Requests POF ZOSYN DEEP TISSUE   Final   Gram Stain     Final   Value: NO WBC SEEN     RARE SQUAMOUS EPITHELIAL CELLS PRESENT     NO ORGANISMS SEEN     Performed at [facility]   Culture     Final   Value: NO ANAEROBES ISOLATED; CULTURE IN PROGRESS FOR 5 DAYS     Performed at [facility]   Report Status PENDING   Incomplete  CLOSTRIDIUM DIFFICILE BY PCR     Status: None   Collection Time    07/15/14 10:22 AM      Result Value Ref Range Status   C difficile by pcr NEGATIVE  NEGATIVE Final   Studies/Results: Dg Chest 2 View  07/15/2014   CLINICAL DATA:  Respiratory distress.  EXAM: CHEST  2 VIEW  COMPARISON:  07/13/2014.  FINDINGS: Mediastinum and hilar structures are normal. Cardiomegaly with pulmonary vascular prominence. Mild interstitial prominence. Left pleural effusion. These findings suggest mild congestive heart failure. Basilar atelectasis. No pneumothorax. No acute bony abnormality.  IMPRESSION: 1. Mild congestive heart failure cannot be excluded. Small left pleural effusion. 2. Bibasilar atelectasis.   Electronically Signed   By: Marcello Moores  Register   On: 07/15/2014 13:20   Medications: I have reviewed the patient's current medications. Scheduled Meds: . chlorhexidine  15 mL Mouth Rinse BID  .  docusate sodium  100 mg Oral BID  . enoxaparin (LOVENOX) injection  40 mg Subcutaneous Q24H  . famotidine  20 mg Oral BID  . feeding supplement (ENSURE COMPLETE)  237 mL Oral BID BM  . hydrALAZINE  50 mg Oral 3 times per day  . insulin aspart  0-9 Units Subcutaneous TID WC  . lisinopril  20 mg Oral Daily  . senna  1 tablet Oral BID  . vancomycin  500 mg Intravenous Q12H   Continuous Infusions: . dextrose 5 % and 0.9% NaCl 100 mL/hr at 07/16/14 0448   PRN Meds:.acetaminophen, HYDROmorphone (DILAUDID) injection, metoCLOPramide (REGLAN) injection, metoCLOPramide, ondansetron (ZOFRAN) IV, ondansetron, oxyCODONE Assessment/Plan: Mr. 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   #L foot osteo s/p transmetarsal amputation: POD#3.  He was septic on presentation with multiple organ failure and electrolyte derangement with high AG acidosis and acute encephalopathy. His electrolyte abnormalities , mental status, and sepsis have resolved after his left foot transmetatarsal amputation. Per ID, he will need a 14-day course of antibiotics beginning today and can continue on doxycycline 100mg  BID through 9/2 as outpatient if Staph aureus is the isolate. He has been afebrile with leucocytosis improving. Tissue culture with NGTD, G stain with  no organisms. Leg abscess culture from 8/18 with MRSA  -Continue vancomycin (Day 7) and transition to PO abx pending tissue culture   #HTN: SBP elevated overnight, improved with hydralazine and lisinopril PO as well as labetalol IV  -Continue home hydralazine 50mg  TID -Resume lisinopril 20mg  daily, he was on 40mg  daily at some point but this was discontinued for unknown reasons  #Atelectasis: Likely in the setting of prolonged bedrest given that he was intubated and recently got transferred out of the ICU.  -Continue working  with PT/OT  -Order incentive spirometry & pulmonary toilet   #DM2: CBGs 100s-200s likely in the setting of recovery.  -Continue CBG q4h  -Encourage PO intake  -Continue sensitive SSI   #CKD Stage 3: Crt stable at 1.27. Bl cr of 1.1   #FEN:  -D5NS @ 100cc/hr  -Diet: Dyspagia 3   #DVT prophylaxis: Lovenox   #CODE STATUS: FULL CODE  Dispo: Awaiting improvement of medical condition, will need SNF placement--CSW consulted as he is s/p transmetatarsal amputation.    .Services Needed at time of discharge: Y = Yes, Blank = No PT:   OT:   RN:   Equipment:   Other:     LOS: 6 days   Blain Pais, MD 07/16/2014, 12:33 PM

## 2014-07-16 NOTE — Progress Notes (Addendum)
0542 Patient's B/P 180/104 schedule Hydralazine 50mg  given po. [id number] Patient's B/P was 182/122 HR 97. Dr. Randell Patient notified and order received for Labetalol 10mg   IV. Will continue to monitor.

## 2014-07-16 NOTE — Progress Notes (Signed)
ANTIBIOTIC CONSULT NOTE - INITIAL  Pharmacy Consult for Vancomycin Indication: Sepsis/DM foot infection/osteomyelitis  No Active Allergies  Patient Measurements: Height: 5' 4.96" (165 cm) Weight: 170 lb 10.2 oz (77.4 kg) IBW/kg (Calculated) : 61.41  Vital Signs: Temp: 98 F (36.7 C) (08/22 0542) Temp src: Oral (08/21 2139) BP: 150/90 mmHg (08/22 0810) Pulse Rate: 97 (08/22 0632) Intake/Output from previous day: 08/21 0701 - 08/22 0700 In: 3136.7 [P.O.:480; I.V.:2356.7; IV Piggyback:300] Out: 1250 [Urine:1250] Intake/Output from this shift:    Labs:  Recent Labs  07/14/14 2125  07/15/14 0400 07/15/14 0530 07/15/14 1016 07/16/14 0610  WBC 13.6*  --   --   --  13.4* 12.0*  HGB 8.3*  --   --   --  8.2* 7.6*  PLT 569*  --   --   --  508* 506*  CREATININE 1.52*  < > 1.49* 1.45*  --  1.27  < > = values in this interval not displayed. Estimated Creatinine Clearance: 57.8 ml/min (by C-G formula based on Cr of 1.27). No results found for this basename: VANCOTROUGH, Corlis Leak, VANCORANDOM, [place], [place], GENTRANDOM, [place], TOBRAPEAK, TOBRARND, AMIKACINPEAK, AMIKACINTROU, AMIKACIN,  in the last 72 hours   Microbiology: Recent Results (from the past 720 hour(s))  CULTURE, BLOOD (ROUTINE X 2)     Status: None   Collection Time    07/10/14  4:10 PM      Result Value Ref Range Status   Specimen Description BLOOD LEFT ARM   Final   Special Requests BOTTLES DRAWN AEROBIC AND ANAEROBIC [place]   Final   Culture  Setup Time     Final   Value: 07/11/2014 00:56     Performed at [facility]   Culture     Final   Value:        BLOOD CULTURE RECEIVED NO GROWTH TO DATE CULTURE WILL BE HELD FOR 5 DAYS BEFORE ISSUING A FINAL NEGATIVE REPORT     Performed at [facility]   Report Status PENDING   Incomplete  URINE CULTURE     Status: None   Collection Time    07/10/14  4:31 PM      Result Value Ref Range Status   Specimen Description URINE, CLEAN CATCH    Final   Special Requests NONE   Final   Culture  Setup Time     Final   Value: 07/11/2014 02:10     Performed at [place]     Final   Value: >=100,000 COLONIES/ML     Performed at [facility]   Culture     Final   Value: Multiple bacterial morphotypes present, none predominant. Suggest appropriate recollection if clinically indicated.     Performed at [facility]   Report Status 07/12/2014 FINAL   Final  CULTURE, BLOOD (ROUTINE X 2)     Status: None   Collection Time    07/10/14  4:55 PM      Result Value Ref Range Status   Specimen Description BLOOD RIGHT HAND   Final   Special Requests BOTTLES DRAWN AEROBIC ONLY 10CC   Final   Culture  Setup Time     Final   Value: 07/11/2014 00:56     Performed at [facility]   Culture     Final   Value:        BLOOD CULTURE RECEIVED NO GROWTH TO DATE CULTURE WILL BE HELD FOR 5 DAYS  BEFORE ISSUING A FINAL NEGATIVE REPORT     Performed at [facility]   Report Status PENDING   Incomplete  URINE CULTURE     Status: None   Collection Time    07/10/14  7:55 PM      Result Value Ref Range Status   Specimen Description URINE, RANDOM   Final   Special Requests NONE   Final   Culture  Setup Time     Final   Value: 07/11/2014 02:08     Performed at [place]     Final   Value: NO GROWTH     Performed at [facility]   Culture     Final   Value: NO GROWTH     Performed at [facility]   Report Status 07/12/2014 FINAL   Final  CULTURE, RESPIRATORY (NON-EXPECTORATED)     Status: None   Collection Time    07/10/14  8:12 PM      Result Value Ref Range Status   Specimen Description TRACHEAL ASPIRATE   Final   Special Requests NONE   Final   Gram Stain     Final   Value: NO WBC SEEN     NO SQUAMOUS EPITHELIAL CELLS SEEN     RARE GRAM POSITIVE COCCI     IN PAIRS     Performed at [facility]   Culture     Final   Value:  Non-Pathogenic Oropharyngeal-type Flora Isolated.     Performed at [facility]   Report Status 07/13/2014 FINAL   Final  CULTURE, BLOOD (ROUTINE X 2)     Status: None   Collection Time    07/10/14  9:04 PM      Result Value Ref Range Status   Specimen Description BLOOD RIGHT ARM   Final   Special Requests BOTTLES DRAWN AEROBIC AND ANAEROBIC [facility] EACH   Final   Culture  Setup Time     Final   Value: 07/11/2014 08:51     Performed at [facility]   Culture     Final   Value:        BLOOD CULTURE RECEIVED NO GROWTH TO DATE CULTURE WILL BE HELD FOR 5 DAYS BEFORE ISSUING A FINAL NEGATIVE REPORT     Performed at [facility]   Report Status PENDING   Incomplete  MRSA PCR SCREENING     Status: Abnormal   Collection Time    07/10/14 10:16 PM      Result Value Ref Range Status   MRSA by PCR POSITIVE (*) NEGATIVE Final   Comment:            The GeneXpert MRSA Assay (FDA     approved for NASAL specimens     only), is one component of a     comprehensive MRSA colonization     surveillance program. It is not     intended to diagnose MRSA     infection nor to guide or     monitor treatment for     MRSA infections.     RESULT CALLED TO, READ BACK BY AND VERIFIED WITH:     SPERRY,A RN [id number] AT 0102 SKEEN,P  CULTURE, ROUTINE-ABSCESS     Status: None   Collection Time    07/12/14  2:41 PM      Result Value Ref Range Status   Specimen Description ABSCESS LEG   Final  Special Requests NONE   Final   Gram Stain     Final   Value: RARE WBC PRESENT, PREDOMINANTLY PMN     NO SQUAMOUS EPITHELIAL CELLS SEEN     FEW GRAM POSITIVE COCCI     IN PAIRS     Performed at [facility]   Culture     Final   Value: MODERATE METHICILLIN RESISTANT STAPHYLOCOCCUS AUREUS     Note: RIFAMPIN AND GENTAMICIN SHOULD NOT BE USED AS SINGLE DRUGS FOR TREATMENT OF STAPH INFECTIONS. This organism DOES NOT demonstrate inducible Clindamycin resistance in vitro. CRITICAL RESULT CALLED TO,  READ BACK BY AND VERIFIED WITH: BREA JOHNSON @      10:12AM 07/15/14 BY DWEEKS     Performed at [facility]   Report Status 07/15/2014 FINAL   Final   Organism ID, Bacteria METHICILLIN RESISTANT STAPHYLOCOCCUS AUREUS   Final  TISSUE CULTURE     Status: None   Collection Time    07/14/14  2:34 PM      Result Value Ref Range Status   Specimen Description TISSUE LEFT FOOT   Final   Special Requests POF ZOSYN DEEP TISSUE   Final   Gram Stain     Final   Value: NO WBC SEEN     RARE SQUAMOUS EPITHELIAL CELLS PRESENT     NO ORGANISMS SEEN     Performed at [facility]   Culture     Final   Value: Culture reincubated for better growth     Performed at [facility]   Report Status PENDING   Incomplete  ANAEROBIC CULTURE     Status: None   Collection Time    07/14/14  2:34 PM      Result Value Ref Range Status   Specimen Description TISSUE LEFT FOOT   Final   Special Requests POF ZOSYN DEEP TISSUE   Final   Gram Stain     Final   Value: NO WBC SEEN     RARE SQUAMOUS EPITHELIAL CELLS PRESENT     NO ORGANISMS SEEN     Performed at [facility]   Culture     Final   Value: NO ANAEROBES ISOLATED; CULTURE IN PROGRESS FOR 5 DAYS     Performed at [facility]   Report Status PENDING   Incomplete  CLOSTRIDIUM DIFFICILE BY PCR     Status: None   Collection Time    07/15/14 10:22 AM      Result Value Ref Range Status   C difficile by pcr NEGATIVE  NEGATIVE Final   Assessment: 3 YOM with sepsis secondary to recurrent diabetic foot infection, now with osteomyelitis. He continues on vancomcyin with plans to change to doxy soon. Scr improved slightly to 1.27. Pt is afebrile and WBC is 12.   Vanc 8/16>> Zosyn 8/16>>8/21  MRSA pos (mupirocin) 8/16: BCx>> (taken post abx in ED) ngtd 8/16: UCx>>NEG 8/16 Resp>> normal flora 8/18 Abscess>>MRSA 8/20: left foot tissue 8/21 Cdiff - NEG  Goal of Therapy:  Vancomycin trough level 15-20 mcg/ml  Plan:   1. Continue vanc 500mg  IV Q12H - increase dose depending on renal fxn trends 2. F/u renal fxn, C&S, clinical status and trough at [facility], PharmD, BCPS Pager # [phone] 07/16/2014 9:14 AM

## 2014-07-17 LAB — CULTURE, BLOOD (ROUTINE X 2)
CULTURE: NO GROWTH
Culture: NO GROWTH
Culture: NO GROWTH

## 2014-07-17 LAB — GLUCOSE, CAPILLARY
GLUCOSE-CAPILLARY: 247 mg/dL — AB (ref 70–99)
GLUCOSE-CAPILLARY: 271 mg/dL — AB (ref 70–99)
GLUCOSE-CAPILLARY: 178 mg/dL — AB (ref 70–99)
GLUCOSE-CAPILLARY: 196 mg/dL — AB (ref 70–99)

## 2014-07-17 MED ORDER — FUROSEMIDE 20 MG PO TABS
20.0000 mg | ORAL_TABLET | Freq: Every day | ORAL | Status: DC
  Administered 2014-07-17 – 2014-07-19 (×3): 20 mg via ORAL
  Filled 2014-07-17 (×3): qty 1

## 2014-07-17 MED ORDER — WHITE PETROLATUM GEL
Status: AC
  Administered 2014-07-17: 06:00:00
  Filled 2014-07-17: qty 5

## 2014-07-17 MED ORDER — DOXYCYCLINE HYCLATE 100 MG PO TABS
100.0000 mg | ORAL_TABLET | Freq: Two times a day (BID) | ORAL | Status: DC
  Administered 2014-07-17 – 2014-07-19 (×5): 100 mg via ORAL
  Filled 2014-07-17 (×7): qty 1

## 2014-07-17 MED ORDER — INSULIN ASPART 100 UNIT/ML ~~LOC~~ SOLN
0.0000 [IU] | Freq: Three times a day (TID) | SUBCUTANEOUS | Status: DC
  Administered 2014-07-17: 8 [IU] via SUBCUTANEOUS
  Administered 2014-07-17 – 2014-07-18 (×2): 3 [IU] via SUBCUTANEOUS
  Administered 2014-07-18: 5 [IU] via SUBCUTANEOUS
  Administered 2014-07-18: 8 [IU] via SUBCUTANEOUS

## 2014-07-17 MED ORDER — CLONIDINE HCL 0.1 MG PO TABS
0.1000 mg | ORAL_TABLET | Freq: Three times a day (TID) | ORAL | Status: DC
  Administered 2014-07-17 – 2014-07-19 (×9): 0.1 mg via ORAL
  Filled 2014-07-17 (×13): qty 1

## 2014-07-17 NOTE — Progress Notes (Signed)
Patient experienced wheezing with exertion after eating dinner tonight and still exhibits generalized edema.  O2 sats WNL.  MD notified, but once page was returned patient's breathing had returned to normal with clear lung sounds bilaterally.  No further orders received.  Will continue to monitor patient.

## 2014-07-17 NOTE — Progress Notes (Signed)
Subjective: No overnight events. Denies constipation, SOB, chest pain, abdominal pain. His left foot pain is well controlled.   Objective: Vital signs in last 24 hours: Filed Vitals:   07/16/14 1636 07/16/14 2217 07/17/14 0500 07/17/14 0647  BP: 158/92 184/101  134/66  Pulse:  105  88  Temp:  98.3 F (36.8 C)  98.2 F (36.8 C)  TempSrc:  Oral  Oral  Resp:  24  20  Height:      Weight:   163 lb 11.2 oz (74.254 kg)   SpO2:  96%  99%   Weight change: 6 lb 1.1 oz (2.754 kg)  Intake/Output Summary (Last 24 hours) at 07/17/14 1222 Last data filed at 07/17/14 0900  Gross per 24 hour  Intake   3255 ml  Output    900 ml  Net   2355 ml   Vitals reviewed.  General: sitting in chair, sleeping as I enter the room, in NAD  HEENT: no scleral icterus  Cardiac: RRR, no rubs, murmurs or gallops  Pulm: bibasilar crackles, no wheezing or rhonchi  Abd: soft, nontender, distended, BS present  Ext: L LE wrapped. R foot with intact pulses DP/PT 2+ and 1+ pitting edema up to his knee  Neuro: alert, oriented to place, moves all extremities on command  Lab Results: Basic Metabolic Panel:  Recent Labs Lab 07/14/14 1850  07/15/14 0530 07/16/14 0610  NA 153*  < > 146 145  K 3.7  < > 3.6* 4.2  CL 115*  < > 111 110  CO2 21  < > 23 22  GLUCOSE 144*  < > 188* 161*  BUN 31*  < > 30* 19  CREATININE 1.43*  < > 1.45* 1.27  CALCIUM 9.0  < > 8.8 8.6  MG 2.3  --   --   --   < > = values in this interval not displayed. Liver Function Tests:  Recent Labs Lab 07/10/14 2109 07/12/14 0239  AST 34 24  ALT 33 26  ALKPHOS 237* 161*  BILITOT 0.7 0.5  PROT 7.1 6.5  ALBUMIN 2.2* 1.8*   CBC:  Recent Labs Lab 07/12/14 0239  07/14/14 0440  07/15/14 1016 07/16/14 0610  WBC 12.9*  < > 13.7*  < > 13.4* 12.0*  NEUTROABS 9.5*  --  10.3*  --   --   --   HGB 7.8*  < > 8.7*  < > 8.2* 7.6*  HCT 24.2*  < > 26.9*  < > 26.9* 24.0*  MCV 83.4  < > 83.3  < > 87.9 87.3  PLT 446*  < > 589*  < > 508* 506*   < > = values in this interval not displayed. Cardiac Enzymes: CBG:  Recent Labs Lab 07/16/14 0743 07/16/14 1156 07/16/14 1712 07/16/14 2143 07/17/14 0756 07/17/14 1157  GLUCAP 224* 232* 240* 252* 247* 271*   Hemoglobin A1C:  Recent Labs Lab 07/10/14 2109  HGBA1C 6.7*    Micro Results: Recent Results (from the past 240 hour(s))  CULTURE, BLOOD (ROUTINE X 2)     Status: None   Collection Time    07/10/14  4:10 PM      Result Value Ref Range Status   Specimen Description BLOOD LEFT ARM   Final   Special Requests BOTTLES DRAWN AEROBIC AND ANAEROBIC [facility]   Final   Culture  Setup Time     Final   Value: 07/11/2014 00:56     Performed at [facility]  Culture     Final   Value:        BLOOD CULTURE RECEIVED NO GROWTH TO DATE CULTURE WILL BE HELD FOR 5 DAYS BEFORE ISSUING A FINAL NEGATIVE REPORT     Performed at [facility]   Report Status PENDING   Incomplete  URINE CULTURE     Status: None   Collection Time    07/10/14  4:31 PM      Result Value Ref Range Status   Specimen Description URINE, CLEAN CATCH   Final   Special Requests NONE   Final   Culture  Setup Time     Final   Value: 07/11/2014 02:10     Performed at [facility] Count     Final   Value: >=100,000 COLONIES/ML     Performed at [facility]   Culture     Final   Value: Multiple bacterial morphotypes present, none predominant. Suggest appropriate recollection if clinically indicated.     Performed at [facility]   Report Status 07/12/2014 FINAL   Final  CULTURE, BLOOD (ROUTINE X 2)     Status: None   Collection Time    07/10/14  4:55 PM      Result Value Ref Range Status   Specimen Description BLOOD RIGHT HAND   Final   Special Requests BOTTLES DRAWN AEROBIC ONLY 10CC   Final   Culture  Setup Time     Final   Value: 07/11/2014 00:56     Performed at [facility]   Culture     Final   Value:        BLOOD CULTURE RECEIVED NO GROWTH TO DATE  CULTURE WILL BE HELD FOR 5 DAYS BEFORE ISSUING A FINAL NEGATIVE REPORT     Performed at [facility]   Report Status PENDING   Incomplete  URINE CULTURE     Status: None   Collection Time    07/10/14  7:55 PM      Result Value Ref Range Status   Specimen Description URINE, RANDOM   Final   Special Requests NONE   Final   Culture  Setup Time     Final   Value: 07/11/2014 02:08     Performed at [facility]     Final   Value: NO GROWTH     Performed at [facility]   Culture     Final   Value: NO GROWTH     Performed at [facility]   Report Status 07/12/2014 FINAL   Final  CULTURE, RESPIRATORY (NON-EXPECTORATED)     Status: None   Collection Time    07/10/14  8:12 PM      Result Value Ref Range Status   Specimen Description TRACHEAL ASPIRATE   Final   Special Requests NONE   Final   Gram Stain     Final   Value: NO WBC SEEN     NO SQUAMOUS EPITHELIAL CELLS SEEN     RARE GRAM POSITIVE COCCI     IN PAIRS     Performed at [facility]   Culture     Final   Value: Non-Pathogenic Oropharyngeal-type Flora Isolated.     Performed at [facility]   Report Status 07/13/2014 FINAL   Final  CULTURE, BLOOD (ROUTINE X 2)     Status: None   Collection Time    07/10/14  9:04 PM      Result Value Ref Range Status   Specimen Description BLOOD RIGHT ARM   Final   Special Requests BOTTLES DRAWN AEROBIC AND ANAEROBIC [facility] EACH   Final   Culture  Setup Time     Final   Value: 07/11/2014 08:51     Performed at [facility]   Culture     Final   Value:        BLOOD CULTURE RECEIVED NO GROWTH TO DATE CULTURE WILL BE HELD FOR 5 DAYS BEFORE ISSUING A FINAL NEGATIVE REPORT     Performed at [facility]   Report Status PENDING   Incomplete  MRSA PCR SCREENING     Status: Abnormal   Collection Time    07/10/14 10:16 PM      Result Value Ref Range Status   MRSA by PCR POSITIVE (*) NEGATIVE Final   Comment:             The GeneXpert MRSA Assay (FDA     approved for NASAL specimens     only), is one component of a     comprehensive MRSA colonization     surveillance program. It is not     intended to diagnose MRSA     infection nor to guide or     monitor treatment for     MRSA infections.     RESULT CALLED TO, READ BACK BY AND VERIFIED WITH:     SPERRY,A RN [id number] AT 0102 SKEEN,P  CULTURE, ROUTINE-ABSCESS     Status: None   Collection Time    07/12/14  2:41 PM      Result Value Ref Range Status   Specimen Description ABSCESS LEG   Final   Special Requests NONE   Final   Gram Stain     Final   Value: RARE WBC PRESENT, PREDOMINANTLY PMN     NO SQUAMOUS EPITHELIAL CELLS SEEN     FEW GRAM POSITIVE COCCI     IN PAIRS     Performed at [facility]   Culture     Final   Value: MODERATE METHICILLIN RESISTANT STAPHYLOCOCCUS AUREUS     Note: RIFAMPIN AND GENTAMICIN SHOULD NOT BE USED AS SINGLE DRUGS FOR TREATMENT OF STAPH INFECTIONS. This organism DOES NOT demonstrate inducible Clindamycin resistance in vitro. CRITICAL RESULT CALLED TO, READ BACK BY AND VERIFIED WITH: BREA JOHNSON @      10:12AM 07/15/14 BY DWEEKS     Performed at [facility]   Report Status 07/15/2014 FINAL   Final   Organism ID, Bacteria METHICILLIN RESISTANT STAPHYLOCOCCUS AUREUS   Final  TISSUE CULTURE     Status: None   Collection Time    07/14/14  2:34 PM      Result Value Ref Range Status   Specimen Description TISSUE LEFT FOOT   Final   Special Requests POF ZOSYN DEEP TISSUE   Final   Gram Stain     Final   Value: NO WBC SEEN     RARE SQUAMOUS EPITHELIAL CELLS PRESENT     NO ORGANISMS SEEN     Performed at [facility]   Culture     Final   Value: FEW STAPHYLOCOCCUS AUREUS     Performed at [facility]   Report Status PENDING   Incomplete  ANAEROBIC CULTURE     Status: None   Collection Time    07/14/14  2:34 PM  Result Value Ref Range Status   Specimen Description TISSUE LEFT FOOT    Final   Special Requests POF ZOSYN DEEP TISSUE   Final   Gram Stain     Final   Value: NO WBC SEEN     RARE SQUAMOUS EPITHELIAL CELLS PRESENT     NO ORGANISMS SEEN     Performed at [facility]   Culture     Final   Value: NO ANAEROBES ISOLATED; CULTURE IN PROGRESS FOR 5 DAYS     Performed at [facility]   Report Status PENDING   Incomplete  CLOSTRIDIUM DIFFICILE BY PCR     Status: None   Collection Time    07/15/14 10:22 AM      Result Value Ref Range Status   C difficile by pcr NEGATIVE  NEGATIVE Final   Studies/Results: Dg Chest 2 View  07/15/2014   CLINICAL DATA:  Respiratory distress.  EXAM: CHEST  2 VIEW  COMPARISON:  07/13/2014.  FINDINGS: Mediastinum and hilar structures are normal. Cardiomegaly with pulmonary vascular prominence. Mild interstitial prominence. Left pleural effusion. These findings suggest mild congestive heart failure. Basilar atelectasis. No pneumothorax. No acute bony abnormality.  IMPRESSION: 1. Mild congestive heart failure cannot be excluded. Small left pleural effusion. 2. Bibasilar atelectasis.   Electronically Signed   By: Marcello Moores  Register   On: 07/15/2014 13:20   Medications: I have reviewed the patient's current medications. Scheduled Meds: . chlorhexidine  15 mL Mouth Rinse BID  . cloNIDine  0.1 mg Oral 3 times per day  . docusate sodium  100 mg Oral BID  . enoxaparin (LOVENOX) injection  40 mg Subcutaneous Q24H  . famotidine  20 mg Oral BID  . feeding supplement (ENSURE COMPLETE)  237 mL Oral BID BM  . hydrALAZINE  50 mg Oral 3 times per day  . insulin aspart  0-9 Units Subcutaneous TID WC  . lisinopril  20 mg Oral Daily  . senna  1 tablet Oral BID  . vancomycin  500 mg Intravenous Q12H   Continuous Infusions: . dextrose 5 % and 0.9% NaCl 100 mL/hr at 07/17/14 0248   PRN Meds:.acetaminophen, HYDROmorphone (DILAUDID) injection, metoCLOPramide (REGLAN) injection, metoCLOPramide, ondansetron (ZOFRAN) IV, ondansetron,  oxyCODONE Assessment/Plan: Mr. 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   #L foot osteo s/p transmetarsal amputation: POD#4. Mental status continues to improve, no longer septic appearing. Per ID, he will need a 14-day course of antibiotics beginning today and can continue on doxycycline 100mg  BID through 9/2 since tissue culture and abscess culture grew Staph aureus (MRSA for abscess cx). -Discontinue Dilaudid IV -Continue oxycodone 5mg  but change to q8hrPRN --has not needed this in 24 hr -Discontinue vancomycin (Day 8) -Start doxycycline 100mg  BID (for 6 more days for a total of 14 days of Abx.) -Orthopedic Surgery following: continue with splinting and elevation  #HTN: SBP improved overnight, at 134/66 this morning. -Continue home hydralazine 50mg  TID, -Continue lisinopril 20mg  daily (he was on 40mg  daily at some point but this was discontinued for unknown reasons) -Resume Lasix 20mg  PO daily -Discontinue IVF   #DM2: CBGs increased overnight >200s with increased food intake.   -Continue CBG q4h  -Change SSI to moderate    #CKD Stage 3: Bl cr of 1.1  -BMET in am  #FEN:  -NSL -Diet: Dyspagia 3   #GI prophylaxis: senokot  #  DVT prophylaxis: Lovenox   #CODE STATUS: FULL CODE   Dispo: Awaiting improvement of medical condition, will need SNF placement--CSW consulted as he is s/p transmetatarsal amputation.   .Services Needed at time of discharge: Y = Yes, Blank = No  PT:    OT:    RN:    Equipment:    Other:        LOS: 7 days   Blain Pais, MD 07/17/2014, 12:22 PM

## 2014-07-17 NOTE — Progress Notes (Signed)
Orthopedics Progress Note  Subjective: The patient says his foot feels better.   Objective:  Filed Vitals:   07/17/14 0647  BP: 134/66  Pulse: 88  Temp: 98.2 F (36.8 C)  Resp: 20    General: Awake and alert  Patient seated in the chair with his leg elevated Musculoskeletal: transmet splint and dressing intact, leg supple, no cords Neurovascularly intact  Lab Results  Component Value Date   WBC 12.0* 07/16/2014   HGB 7.6* 07/16/2014   HCT 24.0* 07/16/2014   MCV 87.3 07/16/2014   PLT 506* 07/16/2014       Component Value Date/Time   NA 145 07/16/2014 0610   K 4.2 07/16/2014 0610   CL 110 07/16/2014 0610   CO2 22 07/16/2014 0610   GLUCOSE 161* 07/16/2014 0610   BUN 19 07/16/2014 0610   CREATININE 1.27 07/16/2014 0610   CREATININE 1.35 07/06/2014 1606   CALCIUM 8.6 07/16/2014 0610   GFRNONAA 59* 07/16/2014 0610   GFRNONAA 56* 07/06/2014 1606   GFRAA 68* 07/16/2014 0610   GFRAA 65 07/06/2014 1606    Lab Results  Component Value Date   INR 1.27 07/14/2014   INR 1.29 07/10/2014   INR 1.76* 04/18/2014    Assessment/Plan: POD #3 s/p Procedure(s): TRANSMETATARSAL AMPUTATION Continue medical management. Patient will need to continue with splinting and elevation.  Doran Heater. Veverly Fells, MD 07/17/2014 10:35 AM

## 2014-07-18 ENCOUNTER — Inpatient Hospital Stay (HOSPITAL_COMMUNITY): Payer: Self-pay

## 2014-07-18 ENCOUNTER — Encounter (HOSPITAL_COMMUNITY): Payer: Self-pay | Admitting: Orthopedic Surgery

## 2014-07-18 DIAGNOSIS — R0902 Hypoxemia: Secondary | ICD-10-CM

## 2014-07-18 DIAGNOSIS — I10 Essential (primary) hypertension: Secondary | ICD-10-CM

## 2014-07-18 LAB — TISSUE CULTURE: Gram Stain: NONE SEEN

## 2014-07-18 LAB — BASIC METABOLIC PANEL
ANION GAP: 11 (ref 5–15)
BUN: 19 mg/dL (ref 6–23)
CALCIUM: 9.3 mg/dL (ref 8.4–10.5)
CO2: 24 mEq/L (ref 19–32)
Chloride: 108 mEq/L (ref 96–112)
Creatinine, Ser: 1.09 mg/dL (ref 0.50–1.35)
GFR, EST AFRICAN AMERICAN: 82 mL/min — AB (ref >90)
GFR, EST NON AFRICAN AMERICAN: 71 mL/min — AB (ref >90)
Glucose, Bld: 179 mg/dL — ABNORMAL HIGH (ref 70–99)
Potassium: 4.6 mEq/L (ref 3.7–5.3)
SODIUM: 143 meq/L (ref 137–147)

## 2014-07-18 LAB — GLUCOSE, CAPILLARY
GLUCOSE-CAPILLARY: 237 mg/dL — AB (ref 70–99)
GLUCOSE-CAPILLARY: 164 mg/dL — AB (ref 70–99)
Glucose-Capillary: 172 mg/dL — ABNORMAL HIGH (ref 70–99)
Glucose-Capillary: 285 mg/dL — ABNORMAL HIGH (ref 70–99)

## 2014-07-18 MED ORDER — INSULIN GLARGINE 100 UNIT/ML ~~LOC~~ SOLN
5.0000 [IU] | Freq: Every day | SUBCUTANEOUS | Status: DC
  Administered 2014-07-18: 5 [IU] via SUBCUTANEOUS
  Filled 2014-07-18 (×2): qty 0.05

## 2014-07-18 MED ORDER — IOHEXOL 350 MG/ML SOLN
80.0000 mL | Freq: Once | INTRAVENOUS | Status: AC | PRN
  Administered 2014-07-18: 71 mL via INTRAVENOUS

## 2014-07-18 MED ORDER — HYDRALAZINE HCL 20 MG/ML IJ SOLN: 5.0000 mg | Freq: Four times a day (QID) | INTRAMUSCULAR | Status: DC | PRN

## 2014-07-18 MED ORDER — INSULIN ASPART 100 UNIT/ML ~~LOC~~ SOLN
0.0000 [IU] | SUBCUTANEOUS | Status: DC
  Administered 2014-07-18: 2 [IU] via SUBCUTANEOUS
  Administered 2014-07-19: 3 [IU] via SUBCUTANEOUS
  Administered 2014-07-19: 1 [IU] via SUBCUTANEOUS

## 2014-07-18 MED ORDER — HYDROCODONE-ACETAMINOPHEN 5-325 MG PO TABS: 1.0000 | ORAL_TABLET | Freq: Four times a day (QID) | ORAL | Status: DC | PRN

## 2014-07-18 NOTE — Clinical Social Work Placement (Addendum)
Clinical Social Work Department CLINICAL SOCIAL WORK PLACEMENT NOTE 07/18/2014  Patient:  Joshua [name], Joshua [name]  Account Number:  [id number] [place] date:  07/10/2014  Clinical Social Worker:  Domenica Reamer, CLINICAL SOCIAL WORKER  Date/time:  07/15/2014 04:50 PM  Clinical Social Work is seeking post-discharge placement for this patient at the following level of care:   SKILLED NURSING   (*CSW will update this form in Epic as items are completed)   07/14/2014  Patient/family provided with [place] Department of Clinical Social Work's list of facilities offering this level of care within the geographic area requested by the patient (or if unable, by the patient's family).  07/14/2014  Patient/family informed of their freedom to choose among providers that offer the needed level of care, that participate in Medicare, Medicaid or managed care program needed by the patient, have an available bed and are willing to accept the patient.  07/14/2014  Patient/family informed of [facility]' ownership interest in [facility], as well as of the fact that they are under no obligation to receive care at this facility.  PASARR submitted to EDS on PASAR exisiting   PASARR number received on NA  FL2 transmitted to all facilities in geographic area requested by pt/family on  07/15/2014 FL2 transmitted to all facilities within larger geographic area on   Patient informed that his/her managed care company has contracts with or will negotiate with  certain facilities, including the following:     Patient/family informed of bed offers received:  07/18/2014 Patient chooses bed at [facility] Physician recommends and patient chooses bed at    Patient to be transferred to [facility] and [place]  on  07/19/14 Patient to be transferred to facility by [facility] Patient and family notified of transfer on 07/19/14 Name of family member notified:  Maggie Font (cousin)  The following physician  request were entered in Epic:   Additional Comments:  No additional needs.  CSW signing off.  Domenica Reamer, [place] Social Worker [phone]

## 2014-07-18 NOTE — Progress Notes (Signed)
Inpatient Diabetes Program Recommendations  AACE/ADA: New Consensus Statement on Inpatient Glycemic Control (2013)  Target Ranges:  Prepandial:   less than 140 mg/dL      Peak postprandial:   less than 180 mg/dL (1-2 hours)      Critically ill patients:  140 - 180 mg/dL     Results for MAHD, CLICK (MRN [id number]) as of 07/18/2014 12:22  Ref. Range 07/17/2014 07:56 07/17/2014 11:57 07/17/2014 17:19 07/17/2014 21:27  Glucose-Capillary Latest Range: 70-99 mg/dL 247 (H) 271 (H) 178 (H) 196 (H)    Results for MICHARL, SABAN (MRN [id number]) as of 07/18/2014 12:22  Ref. Range 07/18/2014 07:57 07/18/2014 11:52  Glucose-Capillary Latest Range: 70-99 mg/dL 172 (H) 285 (H)     Patient eating 95-100% of meals.  Having elevated postprandial glucose excursions.   MD- Please consider adding Novolog Meal Coverage to current insulin regimen- Novolog 3 units tid with meals     Will follow Wyn Quaker RN, MSN, CDE Diabetes Coordinator Inpatient Diabetes Program Team Pager: [phone] (8a-10p)

## 2014-07-18 NOTE — Progress Notes (Signed)
Internal Medicine Interim Progress Note  RN called about worsening respiratory distress.   Upon entering the room, patient was eating dinner and did not complain of worsening shortness of breath. He was on 3L compared to 2.5L this AM. He did have bilateral UE edema but was not showing signs of accessory muscle use.  Considering he will be getting CTA, diuresis in this setting would contraindicated. I will let the night team know to continue assessing.

## 2014-07-18 NOTE — Progress Notes (Signed)
Subjective: No acute events overnight. He feels better this AM. On room air, O2 sat falls to the 70s.   Objective: Vital signs in last 24 hours: Filed Vitals:   07/17/14 1507 07/17/14 1630 07/17/14 2118 07/18/14 0639  BP: 166/93 150/88 183/94 139/79  Pulse: 93  101 92  Temp: 98.4 F (36.9 C)  97.3 F (36.3 C) 98.5 F (36.9 C)  TempSrc: Oral  Oral Oral  Resp: 19  20   Height:      Weight:      SpO2: 98%  96% 98%   Weight change:   Intake/Output Summary (Last 24 hours) at 07/18/14 0643 Last data filed at 07/18/14 0640  Gross per 24 hour  Intake    580 ml  Output   2150 ml  Net  -1570 ml   General: sitting in chair, wearing nasal cannula HEENT: no scleral icterus  Cardiac: RRR, no rubs, murmurs or gallops  Pulm: clear to auscultation bilaterally without wheezing or rhonchi Abd: soft, nontender, distended, BS present  Ext: L LE wrapped. R foot with intact pulses DP/PT 2+ and 1+ pitting edema up to his knee  Neuro: alert, oriented to place, moves all extremities on command   Lab Results: Basic Metabolic Panel:  Recent Labs Lab 07/14/14 1850  07/16/14 0610 07/18/14 0548  NA 153*  < > 145 143  K 3.7  < > 4.2 4.6  CL 115*  < > 110 108  CO2 21  < > 22 24  GLUCOSE 144*  < > 161* 179*  BUN 31*  < > 19 19  CREATININE 1.43*  < > 1.27 1.09  CALCIUM 9.0  < > 8.6 9.3  MG 2.3  --   --   --   < > = values in this interval not displayed.  CBG:  Recent Labs Lab 07/16/14 1712 07/16/14 2143 07/17/14 0756 07/17/14 1157 07/17/14 1719 07/17/14 2127  GLUCAP 240* 252* 247* 271* 178* 196*    Micro Results: Recent Results (from the past 240 hour(s))  CULTURE, BLOOD (ROUTINE X 2)     Status: None   Collection Time    07/10/14  4:10 PM      Result Value Ref Range Status   Specimen Description BLOOD LEFT ARM   Final   Special Requests BOTTLES DRAWN AEROBIC AND ANAEROBIC [facility]   Final   Culture  Setup Time     Final   Value: 07/11/2014 00:56     Performed at [facility]   Culture     Final   Value: NO GROWTH 5 DAYS     Performed at [facility]   Report Status 07/17/2014 FINAL   Final  URINE CULTURE     Status: None   Collection Time    07/10/14  4:31 PM      Result Value Ref Range Status   Specimen Description URINE, CLEAN CATCH   Final   Special Requests NONE   Final   Culture  Setup Time     Final   Value: 07/11/2014 02:10     Performed at [place]     Final   Value: >=100,000 COLONIES/ML     Performed at [facility]   Culture     Final   Value: Multiple bacterial morphotypes present, none predominant. Suggest appropriate recollection if clinically indicated.     Performed at [facility]   Report Status 07/12/2014 FINAL  Final  CULTURE, BLOOD (ROUTINE X 2)     Status: None   Collection Time    07/10/14  4:55 PM      Result Value Ref Range Status   Specimen Description BLOOD RIGHT HAND   Final   Special Requests BOTTLES DRAWN AEROBIC ONLY 10CC   Final   Culture  Setup Time     Final   Value: 07/11/2014 00:56     Performed at [facility]   Culture     Final   Value: NO GROWTH 5 DAYS     Performed at [facility]   Report Status 07/17/2014 FINAL   Final  URINE CULTURE     Status: None   Collection Time    07/10/14  7:55 PM      Result Value Ref Range Status   Specimen Description URINE, RANDOM   Final   Special Requests NONE   Final   Culture  Setup Time     Final   Value: 07/11/2014 02:08     Performed at [place]     Final   Value: NO GROWTH     Performed at [facility]   Culture     Final   Value: NO GROWTH     Performed at [facility]   Report Status 07/12/2014 FINAL   Final  CULTURE, RESPIRATORY (NON-EXPECTORATED)     Status: None   Collection Time    07/10/14  8:12 PM      Result Value Ref Range Status   Specimen Description TRACHEAL ASPIRATE   Final   Special Requests NONE   Final   Gram Stain      Final   Value: NO WBC SEEN     NO SQUAMOUS EPITHELIAL CELLS SEEN     RARE GRAM POSITIVE COCCI     IN PAIRS     Performed at [facility]   Culture     Final   Value: Non-Pathogenic Oropharyngeal-type Flora Isolated.     Performed at [facility]   Report Status 07/13/2014 FINAL   Final  CULTURE, BLOOD (ROUTINE X 2)     Status: None   Collection Time    07/10/14  9:04 PM      Result Value Ref Range Status   Specimen Description BLOOD RIGHT ARM   Final   Special Requests BOTTLES DRAWN AEROBIC AND ANAEROBIC [facility] EACH   Final   Culture  Setup Time     Final   Value: 07/11/2014 08:51     Performed at [facility]   Culture     Final   Value: NO GROWTH 5 DAYS     Performed at [facility]   Report Status 07/17/2014 FINAL   Final  MRSA PCR SCREENING     Status: Abnormal   Collection Time    07/10/14 10:16 PM      Result Value Ref Range Status   MRSA by PCR POSITIVE (*) NEGATIVE Final   Comment:            The GeneXpert MRSA Assay (FDA     approved for NASAL specimens     only), is one component of a     comprehensive MRSA colonization     surveillance program. It is not     intended to diagnose MRSA     infection nor to guide or     monitor treatment for  MRSA infections.     RESULT CALLED TO, READ BACK BY AND VERIFIED WITH:     SPERRY,A RN [id number] AT 0102 SKEEN,P  CULTURE, ROUTINE-ABSCESS     Status: None   Collection Time    07/12/14  2:41 PM      Result Value Ref Range Status   Specimen Description ABSCESS LEG   Final   Special Requests NONE   Final   Gram Stain     Final   Value: RARE WBC PRESENT, PREDOMINANTLY PMN     NO SQUAMOUS EPITHELIAL CELLS SEEN     FEW GRAM POSITIVE COCCI     IN PAIRS     Performed at [facility]   Culture     Final   Value: MODERATE METHICILLIN RESISTANT STAPHYLOCOCCUS AUREUS     Note: RIFAMPIN AND GENTAMICIN SHOULD NOT BE USED AS SINGLE DRUGS FOR TREATMENT OF STAPH INFECTIONS. This organism  DOES NOT demonstrate inducible Clindamycin resistance in vitro. CRITICAL RESULT CALLED TO, READ BACK BY AND VERIFIED WITH: BREA JOHNSON @      10:12AM 07/15/14 BY DWEEKS     Performed at [facility]   Report Status 07/15/2014 FINAL   Final   Organism ID, Bacteria METHICILLIN RESISTANT STAPHYLOCOCCUS AUREUS   Final  TISSUE CULTURE     Status: None   Collection Time    07/14/14  2:34 PM      Result Value Ref Range Status   Specimen Description TISSUE LEFT FOOT   Final   Special Requests POF ZOSYN DEEP TISSUE   Final   Gram Stain     Final   Value: NO WBC SEEN     RARE SQUAMOUS EPITHELIAL CELLS PRESENT     NO ORGANISMS SEEN     Performed at [facility]   Culture     Final   Value: FEW STAPHYLOCOCCUS AUREUS     Performed at [facility]   Report Status PENDING   Incomplete  ANAEROBIC CULTURE     Status: None   Collection Time    07/14/14  2:34 PM      Result Value Ref Range Status   Specimen Description TISSUE LEFT FOOT   Final   Special Requests POF ZOSYN DEEP TISSUE   Final   Gram Stain     Final   Value: NO WBC SEEN     RARE SQUAMOUS EPITHELIAL CELLS PRESENT     NO ORGANISMS SEEN     Performed at [facility]   Culture     Final   Value: NO ANAEROBES ISOLATED; CULTURE IN PROGRESS FOR 5 DAYS     Performed at [facility]   Report Status PENDING   Incomplete  CLOSTRIDIUM DIFFICILE BY PCR     Status: None   Collection Time    07/15/14 10:22 AM      Result Value Ref Range Status   C difficile by pcr NEGATIVE  NEGATIVE Final   Studies/Results: No results found. Medications: I have reviewed the patient's current medications. Scheduled Meds: . chlorhexidine  15 mL Mouth Rinse BID  . cloNIDine  0.1 mg Oral 3 times per day  . docusate sodium  100 mg Oral BID  . doxycycline  100 mg Oral Q12H  . enoxaparin (LOVENOX) injection  40 mg Subcutaneous Q24H  . famotidine  20 mg Oral BID  . feeding supplement (ENSURE COMPLETE)  237 mL Oral BID  BM  . furosemide  20 mg  Oral Daily  . hydrALAZINE  50 mg Oral 3 times per day  . insulin aspart  0-15 Units Subcutaneous TID WC  . lisinopril  20 mg Oral Daily  . 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 Atelectasis is possible given findings on imaging and given that he cannot push past 215mL on his incentive spirometer though he reports using it consistently. 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 Tissue culture and abscess culture grew Staph aureus (MRSA for abscess cx).  -Continue doxycycline 100mg  BID until 9/2  -Orthopedic Surgery following: continue with splinting and elevation   #HTN: SBP improved overnight, at 134/66 this morning.  -Continue hydralazine 50mg  TID, lisinopril 20mg , Lasix 20mg   #DM2: CBGs increased overnight >200s. Wound healing may also be affecting his levels. -Add Lantus 5 units -Switch SSI moderate->sensitive  #FEN:  -Diet: Dyspagia 3   #GI prophylaxis: senokot   #DVT prophylaxis: Lovenox   #CODE STATUS: FULL CODE   Dispo: Awaiting improvement of medical condition, will need SNF placement--CSW consulted as he is s/p transmetatarsal amputation.   The patient does have a current PCP Francesca Oman, DO) and does need an [facility] hospital follow-up appointment after discharge.  The patient does not have transportation limitations that hinder transportation to clinic appointments.  .Services Needed at time of discharge: Y = Yes,  Blank = No PT: SNF  OT:   RN:   Equipment:   Other:     LOS: 8 days   Charlott Rakes, MD 07/18/2014, 6:43 AM

## 2014-07-18 NOTE — Progress Notes (Signed)
Subjective: 4 Days Post-Op Procedure(s) (LRB): TRANSMETATARSAL AMPUTATION (Left) Patient reports pain as mild.    Objective: Vital signs in last 24 hours: Temp:  [97.3 F (36.3 C)-98.5 F (36.9 C)] 98.5 F (36.9 C) (08/24 0639) Pulse Rate:  [92-101] 92 (08/24 0639) Resp:  [19-20] 20 (08/23 2118) BP: (139-183)/(79-94) 139/79 mmHg (08/24 0639) SpO2:  [96 %-98 %] 98 % (08/24 0639)  Intake/Output from previous day: 08/23 0701 - 08/24 0700 In: 580 [P.O.:580] Out: 1750 [Urine:1750] Intake/Output this shift:     Recent Labs  07/15/14 1016 07/16/14 0610  HGB 8.2* 7.6*    Recent Labs  07/15/14 1016 07/16/14 0610  WBC 13.4* 12.0*  RBC 3.06* 2.75*  HCT 26.9* 24.0*  PLT 508* 506*    Recent Labs  07/16/14 0610 07/18/14 0548  NA 145 143  K 4.2 4.6  CL 110 108  CO2 22 24  BUN 19 19  CREATININE 1.27 1.09  GLUCOSE 161* 179*  CALCIUM 8.6 9.3   No results found for this basename: LABPT, INR,  in the last 72 hours  Physical Exam: WD/WN male in nad. A and O x 4. Mood and affect approprite. EOMI. Respiration normal and unlabored. LLE in hard splint. Dressings C/D/I. No calf swelling or palpable cords. Distal sensation and pulses unable to be assessed due to dressings. Good quad contraction on the L.   Assessment/Plan: 4 Days Post-Op Procedure(s) (LRB): TRANSMETATARSAL AMPUTATION (Left) -D/C plan per CM to SNF -D/C per IM service -NWB LLE -ASA 325mg  daily for DVT prophylaxis at d/c -PO Norco for pain managent -Home ABX per IM/ID recs; cultures pending   Mertis Mosher HOWELLS 07/18/2014, 8:04 AM

## 2014-07-18 NOTE — Progress Notes (Signed)
Physical Therapy Treatment Patient Details Name: Joshua [name] MRN: [id number] DOB: Oct 05, 1951 Today's Date: 07/18/2014    History of Present Illness pt presents with L Transmetatarsal Amputation.      PT Comments    Pt with improved ability to maintain NWB status during session today.  Follow Up Recommendations  SNF     Equipment Recommendations  None recommended by PT    Recommendations for Other Services       Precautions / Restrictions Precautions Precautions: Fall Restrictions Weight Bearing Restrictions: Yes LLE Weight Bearing: Non weight bearing    Mobility  Bed Mobility                  Transfers Overall transfer level: Needs assistance Equipment used: Rolling walker (2 wheeled) Transfers: Sit to/from Stand Sit to Stand: Mod assist (from recliner)         General transfer comment: Pt. with improved ability to maintain NWB.  Occasional weight bearing noted with sit to/from stand transfers but pt. maintained once standing  Ambulation/Gait Ambulation/Gait assistance: Mod assist Ambulation Distance (Feet): 1 Feet (1 step with cues for technique) Assistive device: Rolling walker (2 wheeled)       General Gait Details: mod cues to improve UE weight bearing to allow for RLE swing.  Pt. with improved ability with visual and verbal cues   Stairs            Wheelchair Mobility    Modified Rankin (Stroke Patients Only)       Balance Overall balance assessment: Needs assistance Sitting-balance support: Feet supported;No upper extremity supported Sitting balance-Leahy Scale: Fair     Standing balance support: During functional activity;Bilateral upper extremity supported Standing balance-Leahy Scale: Poor Standing balance comment: moderate cues for technique                    Cognition Arousal/Alertness: Awake/alert Behavior During Therapy: WFL for tasks assessed/performed Overall Cognitive Status: Within Functional Limits  for tasks assessed                      Exercises      General Comments        Pertinent Vitals/Pain Pain Assessment: No/denies pain    Home Living                      Prior Function            PT Goals (current goals can now be found in the care plan section) Acute Rehab PT Goals Patient Stated Goal: to walk PT Goal Formulation: With patient Time For Goal Achievement: 07/29/14 Potential to Achieve Goals: Good Progress towards PT goals: Progressing toward goals    Frequency  Min 2X/week    PT Plan Current plan remains appropriate    Co-evaluation             End of Session Equipment Utilized During Treatment: Gait belt;Oxygen (O2 > 90% on 2 L O2) Activity Tolerance: Patient limited by fatigue Patient left: in chair;with call bell/phone within reach     Time: 1040-1103 PT Time Calculation (min): 23 min  Charges:  $Therapeutic Activity: 23-37 mins                    G Codes:      Siri Cole 07/18/2014, 11:10 AM  Laureen Abrahams, PT, DPT [phone]

## 2014-07-18 NOTE — Clinical Social Work Note (Signed)
12:30 CSW spoke with cousin informed cousin that patient would likely be discharged tomorrow  11:00- CSW spoke with cousin about [facility], cousin agreeable.  10:30 CSW spoke with patient about [place].  Patient requested that [place] speak with cousin, Anda Latina [phone].  9:30 spoke with Fransisco Beau at [facility] and [place]- offered bed on [place], [place] Social Worker [phone]

## 2014-07-18 NOTE — Progress Notes (Signed)
Notified MD of patients BP of 188/96 manually and of patients shortness of breath and audible wheezes.

## 2014-07-19 DIAGNOSIS — R6521 Severe sepsis with septic shock: Secondary | ICD-10-CM

## 2014-07-19 DIAGNOSIS — A419 Sepsis, unspecified organism: Secondary | ICD-10-CM

## 2014-07-19 DIAGNOSIS — E8809 Other disorders of plasma-protein metabolism, not elsewhere classified: Secondary | ICD-10-CM

## 2014-07-19 DIAGNOSIS — J9811 Atelectasis: Secondary | ICD-10-CM | POA: Diagnosis not present

## 2014-07-19 LAB — GLUCOSE, CAPILLARY
GLUCOSE-CAPILLARY: 128 mg/dL — AB (ref 70–99)
Glucose-Capillary: 116 mg/dL — ABNORMAL HIGH (ref 70–99)
Glucose-Capillary: 70 mg/dL (ref 70–99)
Glucose-Capillary: 132 mg/dL — ABNORMAL HIGH (ref 70–99)
Glucose-Capillary: 201 mg/dL — ABNORMAL HIGH (ref 70–99)

## 2014-07-19 LAB — BASIC METABOLIC PANEL
Anion gap: 9 (ref 5–15)
BUN: 19 mg/dL (ref 6–23)
CALCIUM: 8.9 mg/dL (ref 8.4–10.5)
CO2: 26 mEq/L (ref 19–32)
Chloride: 105 mEq/L (ref 96–112)
Creatinine, Ser: 1.05 mg/dL (ref 0.50–1.35)
GFR calc Af Amer: 86 mL/min — ABNORMAL LOW (ref >90)
GFR, EST NON AFRICAN AMERICAN: 74 mL/min — AB (ref >90)
Glucose, Bld: 143 mg/dL — ABNORMAL HIGH (ref 70–99)
POTASSIUM: 4 meq/L (ref 3.7–5.3)
SODIUM: 140 meq/L (ref 137–147)

## 2014-07-19 LAB — ANAEROBIC CULTURE: Gram Stain: NONE SEEN

## 2014-07-19 MED ORDER — LISINOPRIL 20 MG PO TABS: 20.0000 mg | ORAL_TABLET | Freq: Every day | ORAL | Status: DC

## 2014-07-19 MED ORDER — INSULIN GLARGINE 100 UNIT/ML ~~LOC~~ SOLN: 5.0000 [IU] | Freq: Every day | SUBCUTANEOUS | Status: DC

## 2014-07-19 MED ORDER — FAMOTIDINE 20 MG PO TABS: 20.0000 mg | ORAL_TABLET | Freq: Two times a day (BID) | ORAL | Status: DC

## 2014-07-19 MED ORDER — ASPIRIN 325 MG PO TBEC: 325.0000 mg | DELAYED_RELEASE_TABLET | Freq: Every day | ORAL | Status: DC

## 2014-07-19 MED ORDER — DOXYCYCLINE HYCLATE 100 MG PO TABS: 100.0000 mg | ORAL_TABLET | Freq: Two times a day (BID) | ORAL | Status: DC

## 2014-07-19 NOTE — Progress Notes (Signed)
Subjective: No acute events overnight. He was eating lunch when I saw him today. He does not complain of his breathing. CTA yesterday was unremarkable for PE.  Objective: Vital signs in last 24 hours: Filed Vitals:   07/18/14 2156 07/19/14 0000 07/19/14 0533 07/19/14 1311  BP: 192/105 171/86 167/74 155/78  Pulse: 106 95 95 103  Temp: 97.8 F (36.6 C)  98.4 F (36.9 C) 98.1 F (36.7 C)  TempSrc: Oral  Oral Oral  Resp: 20  20 19   Height:      Weight:      SpO2: 93% 98% 95% 95%   Weight change:   Intake/Output Summary (Last 24 hours) at 07/19/14 1842 Last data filed at 07/19/14 1300  Gross per 24 hour  Intake    120 ml  Output   1775 ml  Net  -1655 ml   General: sitting in chair, wearing nasal cannula HEENT: no scleral icterus  Cardiac: RRR, no rubs, murmurs or gallops  Pulm: clear to auscultation bilaterally without wheezing or rhonchi Abd: soft, nontender, distended, BS present  Ext: LLE wrapped. R foot with intact pulses DP/PT 2+, trace edema bilaterally UE along with RLE up to knee Neuro: alert, oriented to place, moves all extremities on command   Lab Results: Basic Metabolic Panel:  Recent Labs Lab 07/14/14 1850  07/18/14 0548 07/19/14 0651  NA 153*  < > 143 140  K 3.7  < > 4.6 4.0  CL 115*  < > 108 105  CO2 21  < > 24 26  GLUCOSE 144*  < > 179* 143*  BUN 31*  < > 19 19  CREATININE 1.43*  < > 1.09 1.05  CALCIUM 9.0  < > 9.3 8.9  MG 2.3  --   --   --   < > = values in this interval not displayed.  CBG:  Recent Labs Lab 07/18/14 2154 07/19/14 0006 07/19/14 0354 07/19/14 0532 07/19/14 0813 07/19/14 1218  GLUCAP 164* 116* 70 132* 128* 201*    Micro Results: Recent Results (from the past 240 hour(s))  CULTURE, BLOOD (ROUTINE X 2)     Status: None   Collection Time    07/10/14  4:10 PM      Result Value Ref Range Status   Specimen Description BLOOD LEFT ARM   Final   Special Requests BOTTLES DRAWN AEROBIC AND ANAEROBIC [facility]   Final   Culture  Setup Time     Final   Value: 07/11/2014 00:56     Performed at [facility]   Culture     Final   Value: NO GROWTH 5 DAYS     Performed at [facility]   Report Status 07/17/2014 FINAL   Final  URINE CULTURE     Status: None   Collection Time    07/10/14  4:31 PM      Result Value Ref Range Status   Specimen Description URINE, CLEAN CATCH   Final   Special Requests NONE   Final   Culture  Setup Time     Final   Value: 07/11/2014 02:10     Performed at [place]     Final   Value: >=100,000 COLONIES/ML     Performed at [facility]   Culture     Final   Value: Multiple bacterial morphotypes present, none predominant. Suggest appropriate recollection if clinically indicated.     Performed at [facility]  Report Status 07/12/2014 FINAL   Final  CULTURE, BLOOD (ROUTINE X 2)     Status: None   Collection Time    07/10/14  4:55 PM      Result Value Ref Range Status   Specimen Description BLOOD RIGHT HAND   Final   Special Requests BOTTLES DRAWN AEROBIC ONLY 10CC   Final   Culture  Setup Time     Final   Value: 07/11/2014 00:56     Performed at [facility]   Culture     Final   Value: NO GROWTH 5 DAYS     Performed at [facility]   Report Status 07/17/2014 FINAL   Final  URINE CULTURE     Status: None   Collection Time    07/10/14  7:55 PM      Result Value Ref Range Status   Specimen Description URINE, RANDOM   Final   Special Requests NONE   Final   Culture  Setup Time     Final   Value: 07/11/2014 02:08     Performed at [place]     Final   Value: NO GROWTH     Performed at [facility]   Culture     Final   Value: NO GROWTH     Performed at [facility]   Report Status 07/12/2014 FINAL   Final  CULTURE, RESPIRATORY (NON-EXPECTORATED)     Status: None   Collection Time    07/10/14  8:12 PM      Result Value Ref Range Status   Specimen  Description TRACHEAL ASPIRATE   Final   Special Requests NONE   Final   Gram Stain     Final   Value: NO WBC SEEN     NO SQUAMOUS EPITHELIAL CELLS SEEN     RARE GRAM POSITIVE COCCI     IN PAIRS     Performed at [facility]   Culture     Final   Value: Non-Pathogenic Oropharyngeal-type Flora Isolated.     Performed at [facility]   Report Status 07/13/2014 FINAL   Final  CULTURE, BLOOD (ROUTINE X 2)     Status: None   Collection Time    07/10/14  9:04 PM      Result Value Ref Range Status   Specimen Description BLOOD RIGHT ARM   Final   Special Requests BOTTLES DRAWN AEROBIC AND ANAEROBIC [facility] EACH   Final   Culture  Setup Time     Final   Value: 07/11/2014 08:51     Performed at [facility]   Culture     Final   Value: NO GROWTH 5 DAYS     Performed at [facility]   Report Status 07/17/2014 FINAL   Final  MRSA PCR SCREENING     Status: Abnormal   Collection Time    07/10/14 10:16 PM      Result Value Ref Range Status   MRSA by PCR POSITIVE (*) NEGATIVE Final   Comment:            The GeneXpert MRSA Assay (FDA     approved for NASAL specimens     only), is one component of a     comprehensive MRSA colonization     surveillance program. It is not     intended to diagnose MRSA     infection nor to guide or  monitor treatment for     MRSA infections.     RESULT CALLED TO, READ BACK BY AND VERIFIED WITH:     SPERRY,A RN [id number] AT 0102 SKEEN,P  CULTURE, ROUTINE-ABSCESS     Status: None   Collection Time    07/12/14  2:41 PM      Result Value Ref Range Status   Specimen Description ABSCESS LEG   Final   Special Requests NONE   Final   Gram Stain     Final   Value: RARE WBC PRESENT, PREDOMINANTLY PMN     NO SQUAMOUS EPITHELIAL CELLS SEEN     FEW GRAM POSITIVE COCCI     IN PAIRS     Performed at [facility]   Culture     Final   Value: MODERATE METHICILLIN RESISTANT STAPHYLOCOCCUS AUREUS     Note: RIFAMPIN AND GENTAMICIN  SHOULD NOT BE USED AS SINGLE DRUGS FOR TREATMENT OF STAPH INFECTIONS. This organism DOES NOT demonstrate inducible Clindamycin resistance in vitro. CRITICAL RESULT CALLED TO, READ BACK BY AND VERIFIED WITH: BREA JOHNSON @      10:12AM 07/15/14 BY DWEEKS     Performed at [facility]   Report Status 07/15/2014 FINAL   Final   Organism ID, Bacteria METHICILLIN RESISTANT STAPHYLOCOCCUS AUREUS   Final  TISSUE CULTURE     Status: None   Collection Time    07/14/14  2:34 PM      Result Value Ref Range Status   Specimen Description TISSUE LEFT FOOT   Final   Special Requests POF ZOSYN DEEP TISSUE   Final   Gram Stain     Final   Value: NO WBC SEEN     RARE SQUAMOUS EPITHELIAL CELLS PRESENT     NO ORGANISMS SEEN     Performed at [facility]   Culture     Final   Value: FEW METHICILLIN RESISTANT STAPHYLOCOCCUS AUREUS     Note: This organism DOES NOT demonstrate inducible Clindamycin resistance in vitro. CRITICAL RESULT CALLED TO, READ BACK BY AND VERIFIED WITH: STEPHANIE T 8.24 @ [id number] BY REAMM     Performed at [facility]   Report Status 07/18/2014 FINAL   Final   Organism ID, Bacteria METHICILLIN RESISTANT STAPHYLOCOCCUS AUREUS   Final  ANAEROBIC CULTURE     Status: None   Collection Time    07/14/14  2:34 PM      Result Value Ref Range Status   Specimen Description TISSUE LEFT FOOT   Final   Special Requests POF ZOSYN DEEP TISSUE   Final   Gram Stain     Final   Value: NO WBC SEEN     RARE SQUAMOUS EPITHELIAL CELLS PRESENT     NO ORGANISMS SEEN     Performed at [facility]   Culture     Final   Value: NO ANAEROBES ISOLATED     Performed at [facility]   Report Status 07/19/2014 FINAL   Final  CLOSTRIDIUM DIFFICILE BY PCR     Status: None   Collection Time    07/15/14 10:22 AM      Result Value Ref Range Status   C difficile by pcr NEGATIVE  NEGATIVE Final   Studies/Results: Dg Chest 2 View  07/18/2014   CLINICAL DATA:  Hypoxia  EXAM:  CHEST  2 VIEW  COMPARISON:  PA and lateral chest x-ray of July 15, 2014  FINDINGS: The lungs are hypoinflated.  The interstitial markings remain increased. There are confluent confluent densities at the lung bases on the lateral film. There is no pleural effusion. The cardiac silhouette is mildly enlarged. The central pulmonary vascularity is prominent.  IMPRESSION: Hypoinflation limits the study. CHF is present but one cannot exclude bibasilar atelectasis or pneumonia. There has not been significant interval change since yesterday's study.   Electronically Signed   By: David  Martinique   On: 07/18/2014 13:23   Ct Angio Chest Pe W/cm &/or Wo Cm  07/18/2014   CLINICAL DATA:  Short of breath.  History of COPD.  EXAM: CT ANGIOGRAPHY CHEST WITH CONTRAST  TECHNIQUE: Multidetector CT imaging of the chest was performed using the standard protocol during bolus administration of intravenous contrast. Multiplanar CT image reconstructions and MIPs were obtained to evaluate the vascular anatomy.  CONTRAST:  73mL OMNIPAQUE IOHEXOL 350 MG/ML SOLN  COMPARISON:  01/30/2014  FINDINGS: The heart size is moderately enlarged. There is no pericardial effusion. There is no mediastinal or hilar adenopathy. The main pulmonary artery appears patent. No abnormal filling defects within the lobar or segmental pulmonary arteries.  Partially loculated left pleural effusion is identified and increased in volume from the previous exam.  There is atelectasis identified within the right lower lobe as well as right posterior pleural thickening. Could ground-glass attenuation and interlobular septal thickening is noted bilaterally suggestive of mild interstitial edema.  Incidental imaging through the upper abdomen shows no acute findings.  Review of the visualized bony structures is significant for mild multilevel thoracic spondylosis.  Review of the MIP images confirms the above findings.  IMPRESSION: 1. No evidence for acute pulmonary embolus. 2.  CHF 3. Left pleural effusion appears partially loculated 4. Partial atelectasis within the right lower lobe.   Electronically Signed   By: Kerby Moors M.D.   On: 07/18/2014 19:04   Medications: I have reviewed the patient's current medications. Scheduled Meds: . chlorhexidine  15 mL Mouth Rinse BID  . cloNIDine  0.1 mg Oral 3 times per day  . docusate sodium  100 mg Oral BID  . doxycycline  100 mg Oral Q12H  . enoxaparin (LOVENOX) injection  40 mg Subcutaneous Q24H  . famotidine  20 mg Oral BID  . feeding supplement (ENSURE COMPLETE)  237 mL Oral BID BM  . furosemide  20 mg Oral Daily  . hydrALAZINE  50 mg Oral 3 times per day  . insulin aspart  0-9 Units Subcutaneous 6 times per day  . insulin glargine  5 Units Subcutaneous QHS  . lisinopril  20 mg Oral Daily  . senna  1 tablet Oral BID   Continuous Infusions:  PRN Meds:.acetaminophen, hydrALAZINE, metoCLOPramide (REGLAN) injection, metoCLOPramide, ondansetron (ZOFRAN) IV, ondansetron, oxyCODONE Assessment/Plan:  Mr. 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   #Atelectasis: CXR & CTA yesterday continue to show atelectasis at his bases. He will need to continue working with incentive spirometry on discharge to improve his breathing.  #L foot osteo s/p transmetarsal amputation (8/20):  Tissue culture and abscess culture grew Staph aureus (MRSA for abscess cx).  -Continue doxycycline 100mg  BID until 9/2  -Orthopedic Surgery following: continue with splinting and elevation   #HTN: Continue hydralazine 50mg  TID, lisinopril 20mg , Lasix 20mg   #DM2: CBGs 100s are better. Continue Lantus 5 units & sensitive SSI.  #FEN:  -Diet: Dyspagia 3   #GI prophylaxis: Senokot   #  DVT prophylaxis: Lovenox   #CODE STATUS: FULL CODE   Dispo: Awaiting improvement of medical condition, will need SNF placement--CSW  consulted as he is s/p transmetatarsal amputation.   The patient does have a current PCP Francesca Oman, DO) and does need an [facility] hospital follow-up appointment after discharge.  The patient does not have transportation limitations that hinder transportation to clinic appointments.  .Services Needed at time of discharge: Y = Yes, Blank = No PT: SNF  OT:   RN:   Equipment:   Other:     LOS: 9 days   Charlott Rakes, MD 07/19/2014, 6:42 PM

## 2014-07-19 NOTE — Progress Notes (Signed)
  Date: 07/19/2014  Patient name: Joshua [name]  Medical record number: [id number]  Date of birth: [birth year]   This patient has been seen and the plan of care was discussed with the house staff. Please see their note for complete details. I concur with their findings.  Bartholomew Crews, MD 07/19/2014, 5:26 PM

## 2014-07-19 NOTE — Progress Notes (Signed)
Report called to [facility] and given to Oretha Ellis, LPN. Patient transferred via ambulance. All documents and discharge summary sent with patient. Patient in route to facility.

## 2014-07-19 NOTE — Discharge Instructions (Signed)
Thank you for trusting [place] with your medical care!  You were hospitalized for sepsis from osteomyelitis of your left foot and were treated with surgery and antibiotics.  Please continue taking your antibiotic until 9/2: doxycycline 100mg  BID.  Please read the following information regarding your care after surgery. Wylene Simmer, MD, [facility])  Medications X To help prevent blood clots, take an aspirin (325 mg) once a day for a month after surgery.  You should also get up every hour while you are awake to move around.    Weight Bearing X Do not bear any weight on the operated leg or foot.  Cast / Splint / Dressing X Keep your splint or cast clean and dry.  Dont put anything (coat hanger, pencil, etc) down inside of it.  If it gets damp, use a hair dryer on the cool setting to dry it.  If it gets soaked, call the office to schedule an appointment for a cast change.   After your dressing, cast or splint is removed; you may shower, but do not soak or scrub the wound.  Allow the water to run over it, and then gently pat it dry.  Swelling It is normal for you to have swelling where you had surgery.  To reduce swelling and pain, keep your toes above your nose for at least 3 days after surgery.  It may be necessary to keep your foot or leg elevated for several weeks.  If it hurts, it should be elevated.  Follow Up Call my office at [phone] when you are discharged from the hospital or surgery center to schedule an appointment to be seen two weeks after surgery.  Call my office at [phone] if you develop a fever >101.5 F, nausea, vomiting, bleeding from the surgical site or severe pain.

## 2014-07-21 ENCOUNTER — Ambulatory Visit: Payer: Self-pay

## 2014-07-26 ENCOUNTER — Encounter: Payer: Self-pay | Admitting: Gastroenterology

## 2014-08-03 ENCOUNTER — Ambulatory Visit (INDEPENDENT_AMBULATORY_CARE_PROVIDER_SITE_OTHER): Payer: Self-pay | Admitting: Internal Medicine

## 2014-08-03 ENCOUNTER — Encounter: Payer: Self-pay | Admitting: Internal Medicine

## 2014-08-03 VITALS — BP 121/66 | HR 70 | Temp 97.4°F | Ht 64.0 in | Wt 150.9 lb

## 2014-08-03 DIAGNOSIS — Z23 Encounter for immunization: Secondary | ICD-10-CM

## 2014-08-03 DIAGNOSIS — Z0000 Encounter for general adult medical examination without abnormal findings: Secondary | ICD-10-CM

## 2014-08-03 DIAGNOSIS — N058 Unspecified nephritic syndrome with other morphologic changes: Secondary | ICD-10-CM

## 2014-08-03 DIAGNOSIS — I872 Venous insufficiency (chronic) (peripheral): Secondary | ICD-10-CM

## 2014-08-03 DIAGNOSIS — E1121 Type 2 diabetes mellitus with diabetic nephropathy: Secondary | ICD-10-CM

## 2014-08-03 DIAGNOSIS — E1129 Type 2 diabetes mellitus with other diabetic kidney complication: Secondary | ICD-10-CM

## 2014-08-03 DIAGNOSIS — Z9981 Dependence on supplemental oxygen: Secondary | ICD-10-CM | POA: Insufficient documentation

## 2014-08-03 DIAGNOSIS — E1122 Type 2 diabetes mellitus with diabetic chronic kidney disease: Secondary | ICD-10-CM

## 2014-08-03 DIAGNOSIS — M869 Osteomyelitis, unspecified: Secondary | ICD-10-CM

## 2014-08-03 DIAGNOSIS — N189 Chronic kidney disease, unspecified: Secondary | ICD-10-CM

## 2014-08-03 DIAGNOSIS — I10 Essential (primary) hypertension: Secondary | ICD-10-CM

## 2014-08-03 LAB — BASIC METABOLIC PANEL WITH GFR
BUN: 32 mg/dL — AB (ref 6–23)
CO2: 27 mEq/L (ref 19–32)
Calcium: 9 mg/dL (ref 8.4–10.5)
Chloride: 101 mEq/L (ref 96–112)
Creat: 1.17 mg/dL (ref 0.50–1.35)
GFR, EST NON AFRICAN AMERICAN: 66 mL/min
GFR, Est African American: 76 mL/min
Glucose, Bld: 174 mg/dL — ABNORMAL HIGH (ref 70–99)
Potassium: 4.4 mEq/L (ref 3.5–5.3)
Sodium: 137 mEq/L (ref 135–145)

## 2014-08-03 NOTE — Assessment & Plan Note (Addendum)
Lab Results  Component Value Date   HGBA1C 6.7* 07/10/2014   HGBA1C 6.8 07/06/2014   HGBA1C 6.5 02/24/2014     Assessment: Diabetes control: good control (HgbA1C at goal) Progress toward A1C goal:   at goal Comments: Lantus 5 units daily was added at hospital d/c.  He has been getting Lantus 5 units and glimepiride 1mg  daily at SNF.  The SNF did not send CBG logs.  The patient says he knows the symptoms of hypoglycemia (he gets sweaty, tachycardic) and he has not experienced these symptoms in recent months.  I have written a note to the SNF requesting they check his CBGs at least three times daily since he is on agents that could cause hypoglycemia.  His postprandial CBG in clinic today is in the 180s so I think he is being adequately controlled.  Plan: Medications:  continue current medications:  Glimepiride 1mg  daily and Lantus 5 units daily.   Home glucose monitoring: Frequency: 3 times a day Timing:   Instruction/counseling given: I advised him to let the SNF staff know if he feels hypoglycemic and to request CBG.   Educational resources provided: brochure Other plans: He has good DM control.  I do not think he will continue to require Lantus.  It will be helpful to see CBG logs from SNF.  Will have him RTC in 2-3 months or sooner if he is discharged from SNF earlier.

## 2014-08-03 NOTE — Assessment & Plan Note (Addendum)
Flu vaccine given at this visit.  A rep from [facility] spoke with him about colonoscopy screening (he is currently uninsured).

## 2014-08-03 NOTE — Assessment & Plan Note (Signed)
BP Readings from Last 3 Encounters:  08/03/14 121/66  07/19/14 155/78  07/19/14 155/78    Lab Results  Component Value Date   NA 140 07/19/2014   K 4.0 07/19/2014   CREATININE 1.05 07/19/2014    Assessment: Blood pressure control:  well controlled Progress toward BP goal:   at goal  Plan: Medications:  continue current medications:  Clonidine 0.1mg  TID, Lasix 20mg  BID, lisinopril 20mg  daily, hydralazine 50mg  TID Educational resources provided: brochure Other plans:  Will check BMP today and may need to adjust meds if Cr is increasing.  I will call SNF if adjustments need to be made.  Otherwise, I will have him RTC in 2-3 months or at SNF discharge if that occurs before 2-3 months from now.

## 2014-08-03 NOTE — Progress Notes (Signed)
   Subjective:    Patient ID: Joshua [name], male    DOB: 03-22-1951, 63 y.o.   MRN: [id number]  HPI Comments: Joshua [name] is a 63 year old male with a PMH of HTN, DM type 2 (Hgb A1c 6.27 February 2014 ), chronic venous stasis, nephrotic syndrome, dHF (EF 55-60% March 2015), Fe-deficiency anemia, cdiff infection and chronic diabetic foot ulcer recently hospitalized for septic shock due to left foot osteomyelitis requiring left transmetatarsal amputation.     He continues to have leg swelling.  He was seen by Dr. Doran Durand last week and reports things are doing well.  He is scheduled to follow-up again in two weeks.  He is unsure of current medications but says he takes what they give him at [facility].  I have compared the med list from SNF to med list at discharge.  The frequency of Lasix appears to have been increased from 20mg  daily to 20mg  BID at SNF.  He was discharged on 2L continuous oxygen but says he does not always need to use it.  He denies CP, dyspnea, lightheadedness, change in vision, change in appetite or hypoglycemic symptoms.         Review of Systems  Constitutional: Negative for fever, chills and appetite change.  Respiratory: Negative for shortness of breath.   Cardiovascular: Positive for leg swelling. Negative for chest pain and palpitations.  Gastrointestinal: Negative for nausea, vomiting, abdominal pain and diarrhea.  Genitourinary: Negative for dysuria and hematuria.  Neurological: Positive for headaches. Negative for syncope, weakness and light-headedness.       Occasional, responds to prn Tylenol  Psychiatric/Behavioral: Negative for sleep disturbance and dysphoric mood.       Objective:   Physical Exam  Vitals reviewed. Constitutional: He is oriented to person, place, and time. He appears well-developed. No distress.  HENT:  Head: Normocephalic and atraumatic.  Mouth/Throat: Oropharynx is clear and moist. No oropharyngeal exudate.  Eyes: EOM are normal. Pupils are  equal, round, and reactive to light.  Cardiovascular: Normal rate, regular rhythm and normal heart sounds.  Exam reveals no gallop and no friction rub.   No murmur heard. Pulmonary/Chest: Effort normal and breath sounds normal. No respiratory distress. He has no wheezes. He has no rales.  Abdominal: Soft. Bowel sounds are normal. He exhibits no distension. There is no tenderness.  Musculoskeletal: Normal range of motion. He exhibits edema. He exhibits no tenderness.  1+ B/L lower extremity edema; left leg is bandaged from foot to just below knee  Neurological: He is alert and oriented to person, place, and time. No cranial nerve deficit.  Skin: Skin is warm. He is not diaphoretic.  Psychiatric: He has a normal mood and affect. His behavior is normal.          Assessment & Plan:  Please see problem based assessment and plan.

## 2014-08-03 NOTE — Assessment & Plan Note (Addendum)
He has followed up with orthopedic surgeon and he says he was told he is healing well.  His left leg is wrapped from foot to just below knee.  He has no fever/chills, N/V, loss of appetite and he is hemodynamically stable.  I will send for records from the follow-up visit.

## 2014-08-03 NOTE — Assessment & Plan Note (Signed)
He required intubation at admission and was requiring 2.5L via [place] after extubation.  CTA was negative for PE, and serial CXR showed stable L pleural effusion and B/L atelectasis.  He was discharged on 2L.  His SpO2 is 100% on 2L and 97% on room air.  His lungs are clear and he is not dyspneic.   - I advised him that he does not need to use oxygen continuously  - Oxygen should remain available for his use should he need it acutely or prn - I have written this down on a form and sent it with him to the SNF

## 2014-08-03 NOTE — Patient Instructions (Signed)
1. I will call if there are any medication changes to be made based on lab work.  Otherwise, please continue current medications.  Move your legs when you can and keep your legs elevated when seated.  You can wear compression stocking on your right leg if there are no blisters.  This may help with swelling.  Keep legs clean and moist and monitor for skin breakdown, redness, pain or fevers.  2. Please take all medications as prescribed.  Please check blood sugar at least 3 times daily because you are taking medications that can cause low blood sugars.  If you feel shaky, sweaty, anxious please check your blood sugar.  If it is less than 70 eat a snack of graham crackers or 4 ounces of juice.  3. If you have worsening of your symptoms or new symptoms arise, please call the clinic [phone]), or go to the ER immediately if symptoms are severe.  Please return to see me in 2-3 months.  If you are discharged home from the nursing facility before then, please come see me at that time.  Do not forget to keep your follow-up appointment with Dr. Doran Durand (surgeon).  Venous Stasis or Chronic Venous Insufficiency Chronic venous insufficiency, also called venous stasis, is a condition that affects the veins in the legs. The condition prevents blood from being pumped through these veins effectively. Blood may no longer be pumped effectively from the legs back to the heart. This condition can range from mild to severe. With proper treatment, you should be able to continue with an active life. CAUSES  Chronic venous insufficiency occurs when the vein walls become stretched, weakened, or damaged or when valves within the vein are damaged. Some common causes of this include:  High blood pressure inside the veins (venous hypertension).  Increased blood pressure in the leg veins from long periods of sitting or standing.  A blood clot that blocks blood flow in a vein (deep vein thrombosis).  Inflammation of a  superficial vein (phlebitis) that causes a blood clot to form. RISK FACTORS Various things can make you more likely to develop chronic venous insufficiency, including:  Family history of this condition.  Obesity.  Pregnancy.  Sedentary lifestyle.  Smoking.  Jobs requiring long periods of standing or sitting in one place.  Being a certain age. Women in their 25s and 105s and men in their 90s are more likely to develop this condition. SIGNS AND SYMPTOMS  Symptoms may include:   Varicose veins.  Skin breakdown or ulcers.  Reddened or discolored skin on the leg.  Brown, smooth, tight, and painful skin just above the ankle, usually on the inside surface (lipodermatosclerosis).  Swelling. DIAGNOSIS  To diagnose this condition, your health care provider will take a medical history and do a physical exam. The following tests may be ordered to confirm the diagnosis:  Duplex ultrasound--A procedure that produces a picture of a blood vessel and nearby organs and also provides information on blood flow through the blood vessel.  Plethysmography--A procedure that tests blood flow.  A venogram, or venography--A procedure used to look at the veins using X-ray and dye. TREATMENT The goals of treatment are to help you return to an active life and to minimize pain or disability. Treatment will depend on the severity of the condition. Medical procedures may be needed for severe cases. Treatment options may include:   Use of compression stockings. These can help with symptoms and lower the chances of the problem getting  worse, but they do not cure the problem.  Sclerotherapy--A procedure involving an injection of a material that "dissolves" the damaged veins. Other veins in the network of blood vessels take over the function of the damaged veins.  Surgery to remove the vein or cut off blood flow through the vein (vein stripping or laser ablation surgery).  Surgery to repair a valve. HOME  CARE INSTRUCTIONS   Wear compression stockings as directed by your health care provider.  Only take over-the-counter or prescription medicines for pain, discomfort, or fever as directed by your health care provider.  Follow up with your health care provider as directed. SEEK MEDICAL CARE IF:   You have redness, swelling, or increasing pain in the affected area.  You see a red streak or line that extends up or down from the affected area.  You have a breakdown or loss of skin in the affected area, even if the breakdown is small.  You have an injury to the affected area. SEEK IMMEDIATE MEDICAL CARE IF:   You have an injury and open wound in the affected area.  Your pain is severe and does not improve with medicine.  You have sudden numbness or weakness in the foot or ankle below the affected area, or you have trouble moving your foot or ankle.  You have a fever or persistent symptoms for more than 2-3 days.  You have a fever and your symptoms suddenly get worse. MAKE SURE YOU:   Understand these instructions.  Will watch your condition.  Will get help right away if you are not doing well or get worse. Document Released: 03/17/2007 Document Revised: 09/01/2013 Document Reviewed: 07/19/2013 [facility] Patient Information 2015 [place], [place]. This information is not intended to replace advice given to you by your health care provider. Make sure you discuss any questions you have with your health care provider.

## 2014-08-03 NOTE — Assessment & Plan Note (Signed)
Right leg with some swelling but looks improved from last visit.  No blisters or wounds apparent on leg or foot.  He says he is receiving skin care at [facility].  He does not have pain in either leg.  Left leg dressing was recently changed at orthopedic surgeons office. - continue Lasix 20mg  BID for now - check BMP (since he is on Lasix BID and lisinopril 20mg  and has had several episodes of AKI recently) - he was advised to elevate the legs when he can and to move the legs when he can - he was advised than he can try using compression stockings on the right leg while there are no blisters apparent - recommend continued skin care including keeping the skin clean and moistorized - will follow-up in 2-3 months or sooner if he is discharged earlier from SNF

## 2014-08-04 LAB — GLUCOSE, CAPILLARY: Glucose-Capillary: 183 mg/dL — ABNORMAL HIGH (ref 70–99)

## 2014-08-04 NOTE — Progress Notes (Signed)
Case discussed with Dr. Wilson soon after the resident saw the patient.  We reviewed the resident's history and exam and pertinent patient test results.  I agree with the assessment, diagnosis and plan of care documented in the resident's note. 

## 2014-08-25 NOTE — Addendum Note (Signed)
Addended by: Truddie Crumble on: 08/25/2014 09:34 AM   Modules accepted: Orders

## 2014-11-03 ENCOUNTER — Encounter (HOSPITAL_COMMUNITY): Payer: Self-pay | Admitting: Cardiovascular Disease

## 2014-12-05 IMAGING — CR DG CHEST 2V
2 series · 2 of 2 positions shown · non-contrast
Comparison: 01/20/2014

CLINICAL DATA: Shortness of breath, lower extremity edema

EXAM:
CHEST  2 VIEW

[view not recorded (1 of 2)]
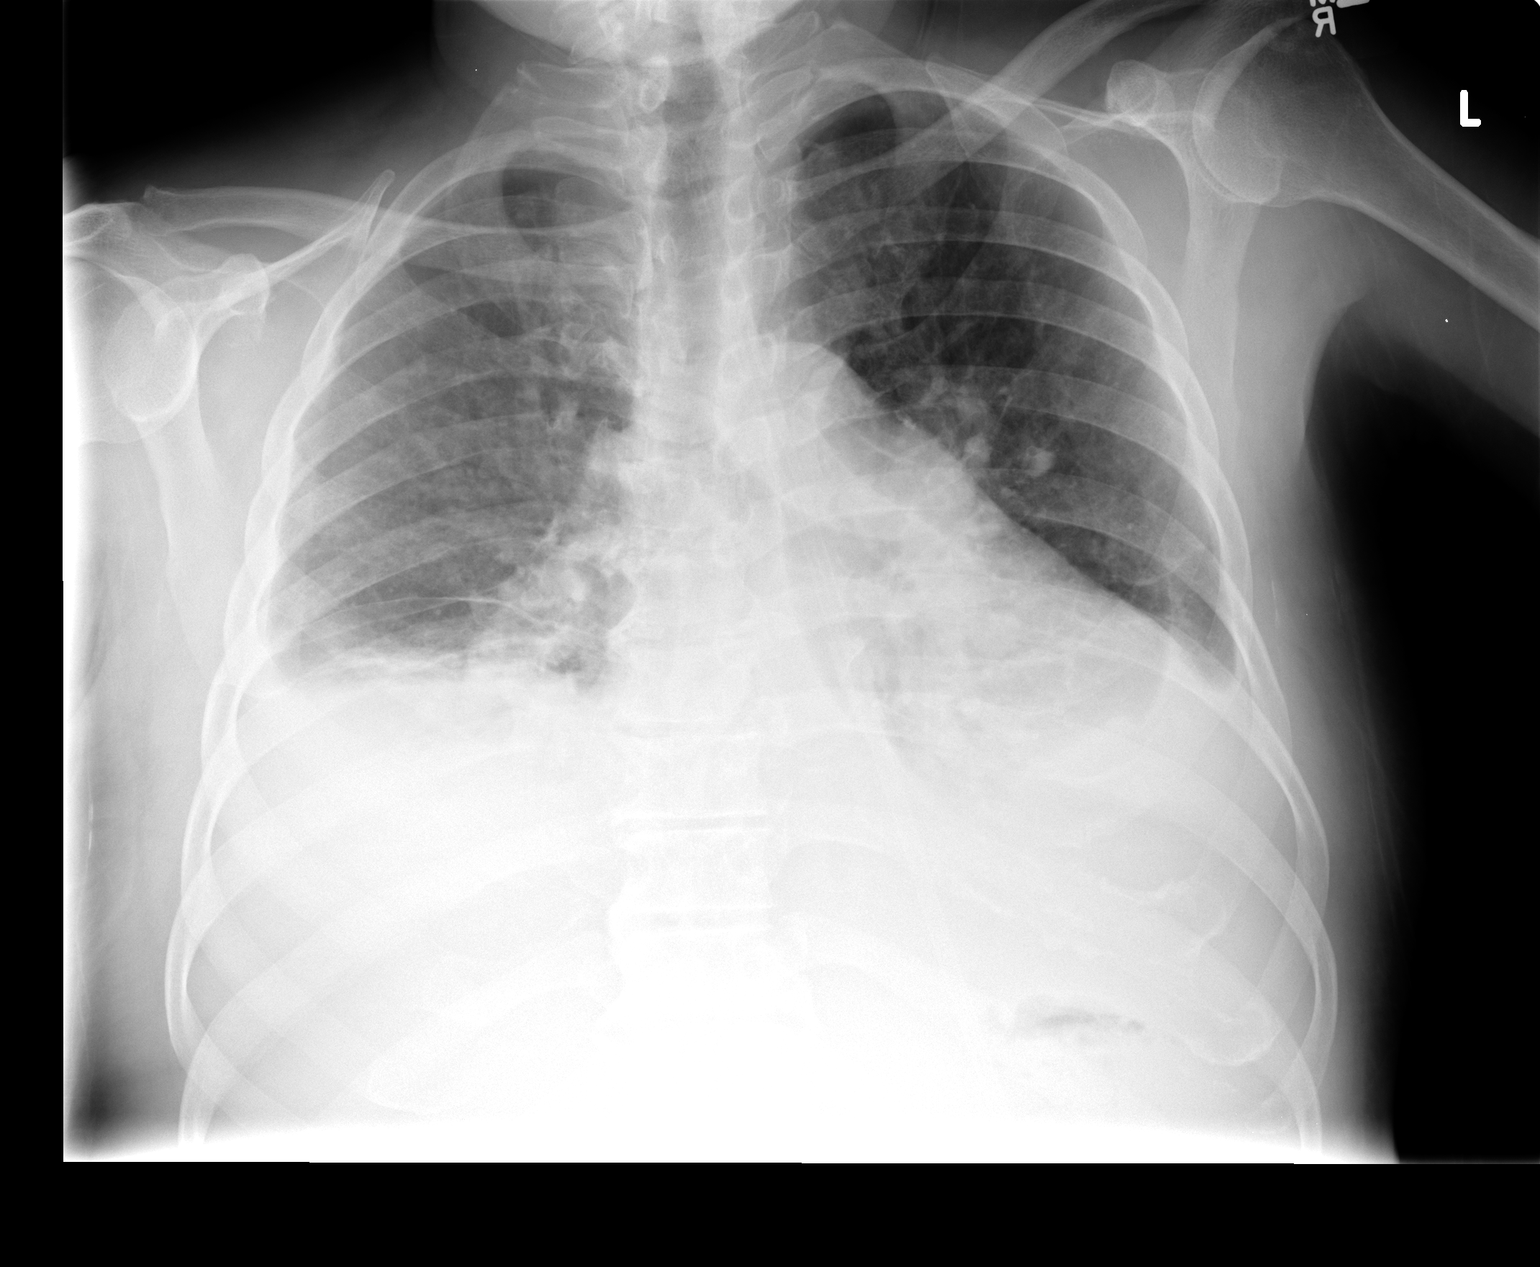

[view not recorded (2 of 2)]
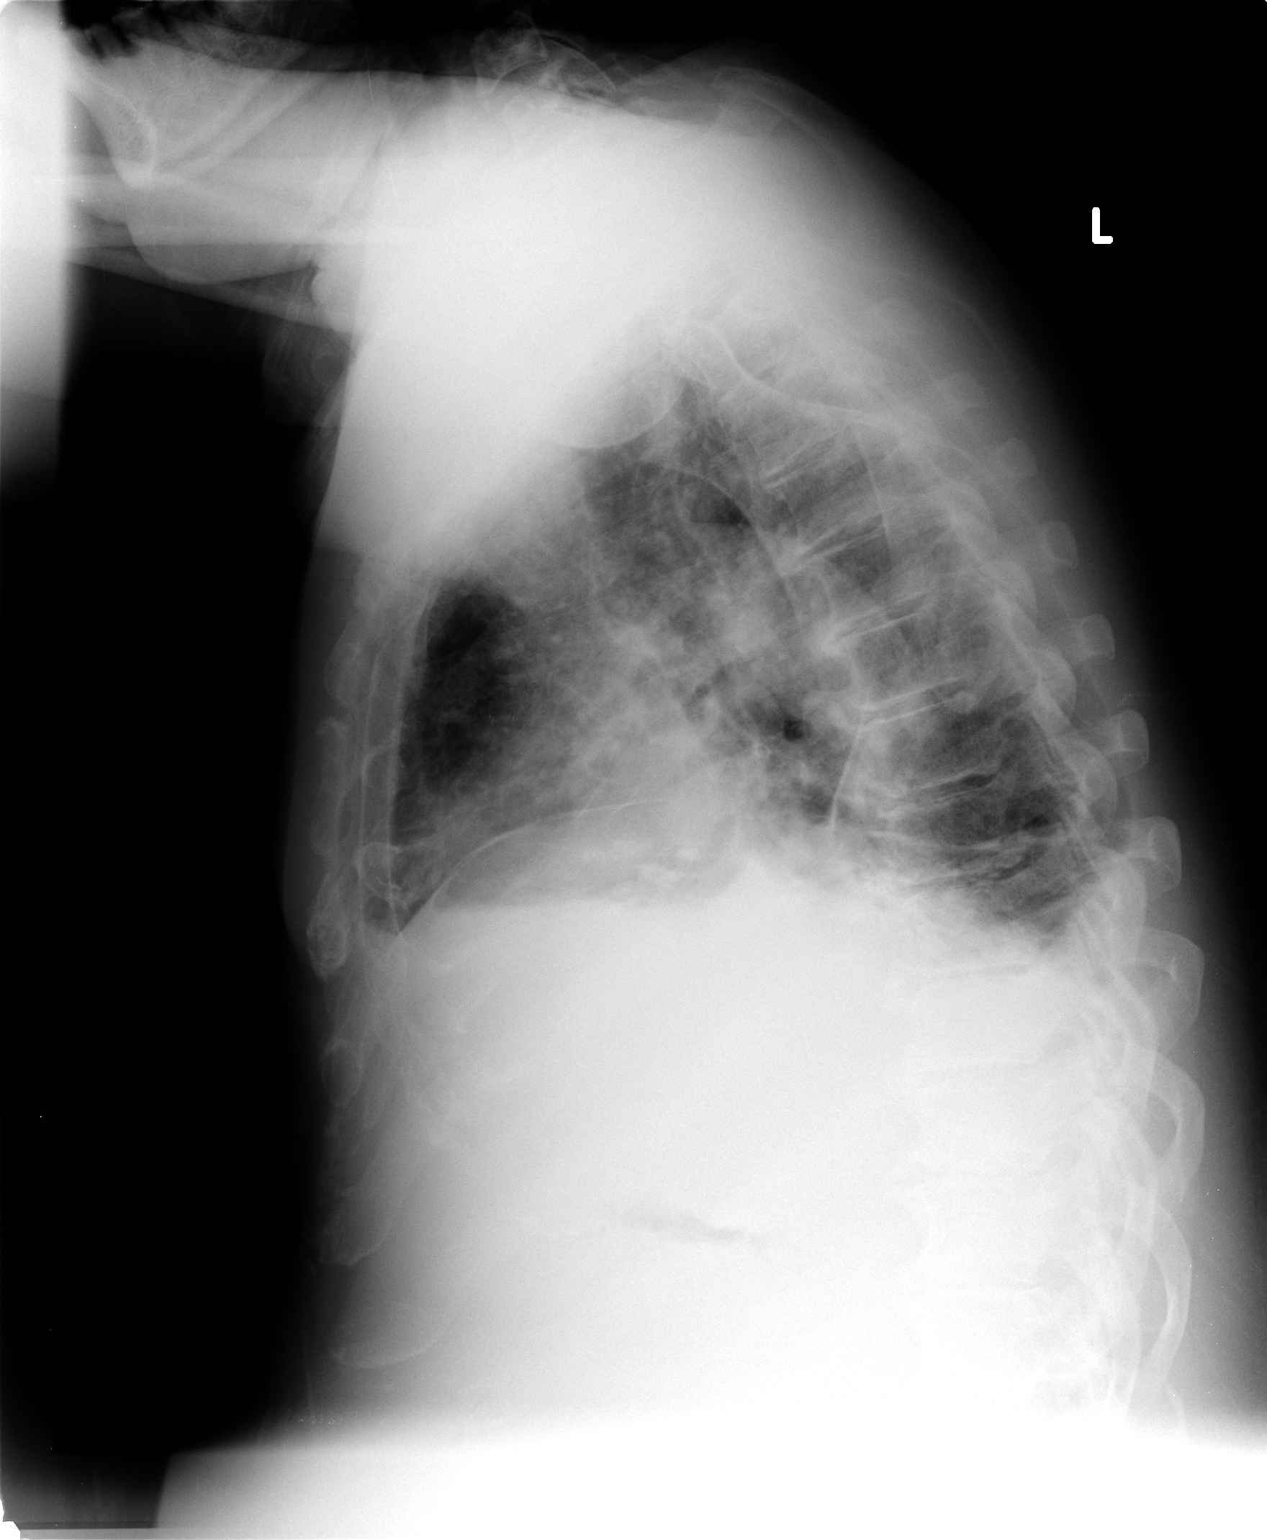

[2 of 2 positions shown; findings below may reference images not displayed]

FINDINGS: Cardiomegaly again noted. Bilateral small pleural effusion with
bilateral basilar atelectasis or infiltrate. No pulmonary edema.
Mild degenerative changes thoracic spine.
IMPRESSION: Cardiomegaly. Bilateral small pleural effusion with bilateral
basilar atelectasis or infiltrate.

## 2014-12-09 IMAGING — CR DG CHEST 1V PORT
1 series · 1 of 1 positions shown · non-contrast
Comparison: [DATE]/[id number] hr

CLINICAL DATA: Central line placement.

EXAM:
PORTABLE CHEST - 1 VIEW

[AP]
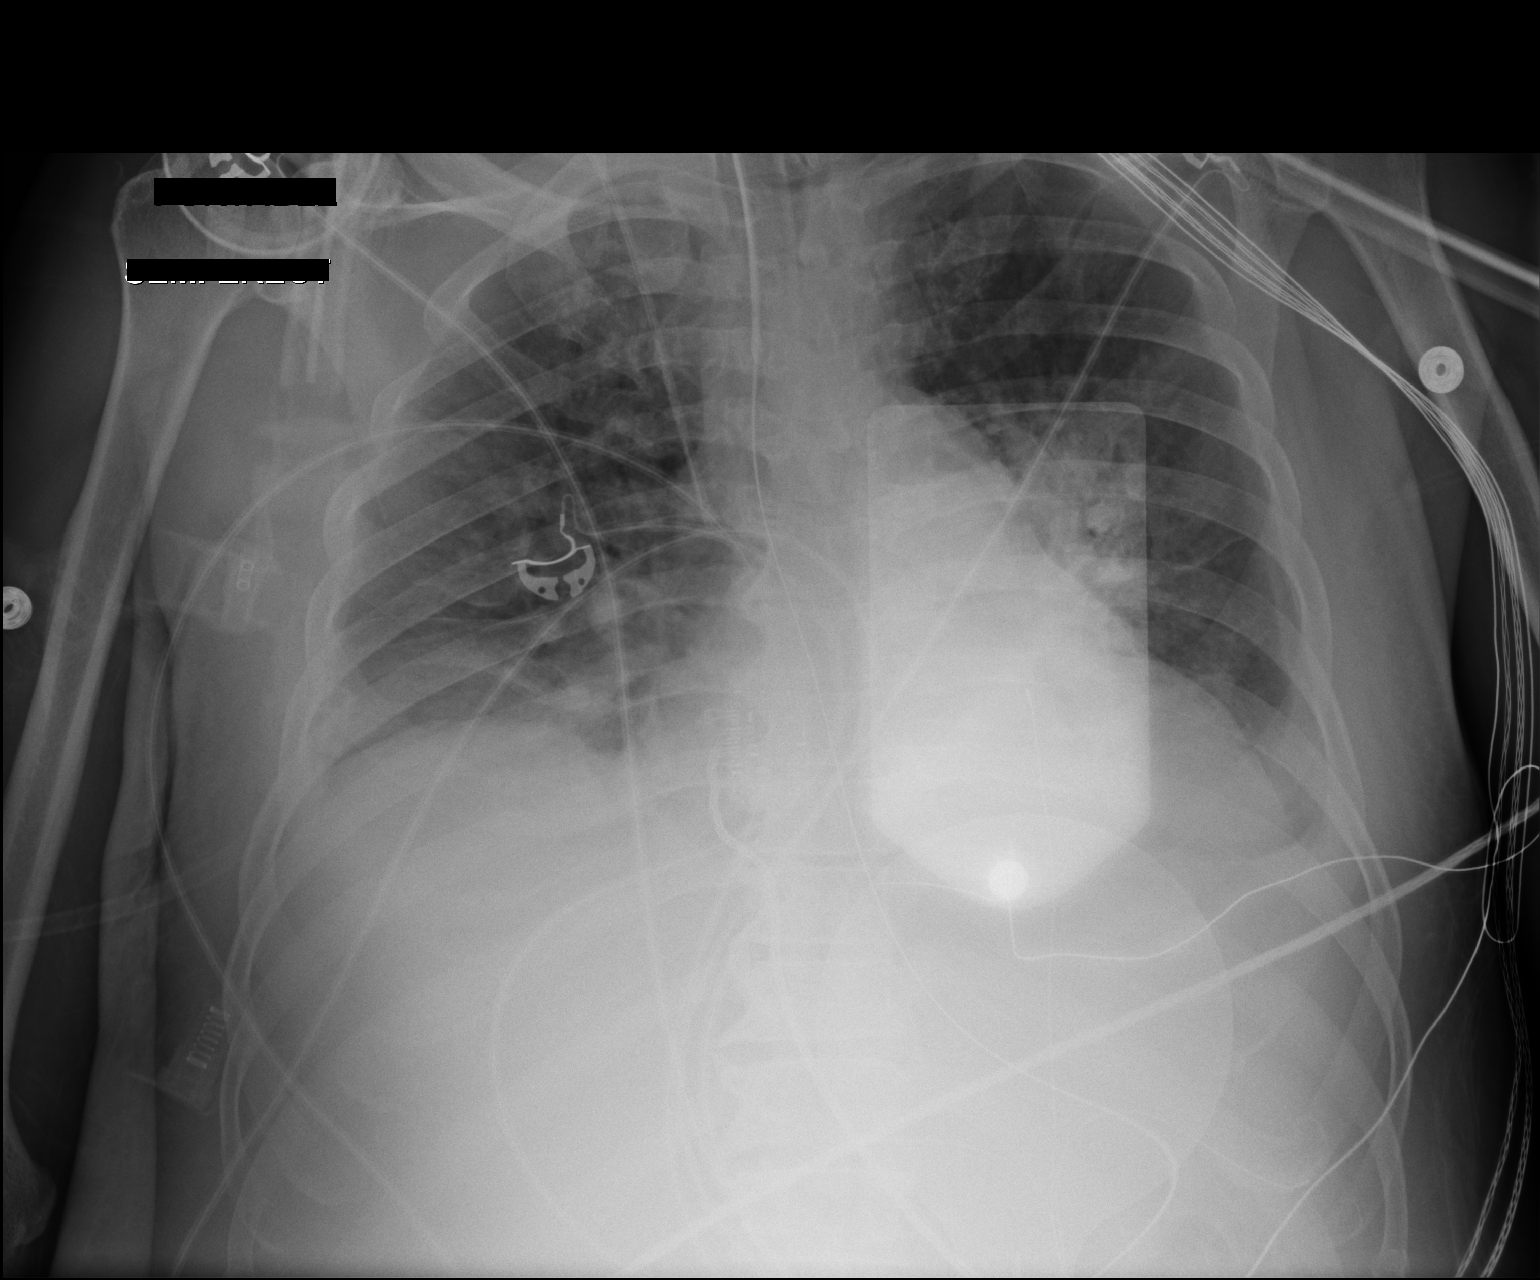

[1 of 1 positions shown; findings below may reference images not displayed]

FINDINGS: Right internal jugular central venous line has its tip in the lower
superior vena cava. There is no pneumothorax.

Endotracheal tube has been retracted. Tip now lies 3.7 cm above the
chronic, well positioned.

Nasogastric tube is stable passing well into the stomach. Lung base
opacity is also unchanged.
IMPRESSION: 1. Right internal jugular central venous line tip in the lower
superior vena cava. No pneumothorax.
2. Endotracheal tube well positioned.

## 2014-12-09 IMAGING — CT CT CHEST W/O CM
2 of 4 series · 13 of 36 positions shown, 16 images · non-contrast
Comparison: None.

CT CHEST W/O CM dated 01/07/2014; DG CHEST 1 VIEW dated 01/30/2014

CLINICAL DATA: Cardiac arrest.

EXAM:
CT CHEST, ABDOMEN AND PELVIS WITHOUT CONTRAST
TECHNIQUE: Multidetector CT imaging of the chest, abdomen and pelvis was
performed following the standard protocol without IV contrast.

[Series 2: cap w/o 5.0 b40f · axial · non-contrast · 0.82mm/px · z∈[-575,-50]mm · 10 of 119 slices shown, 13 images]
[im 7/119  mediastinal]
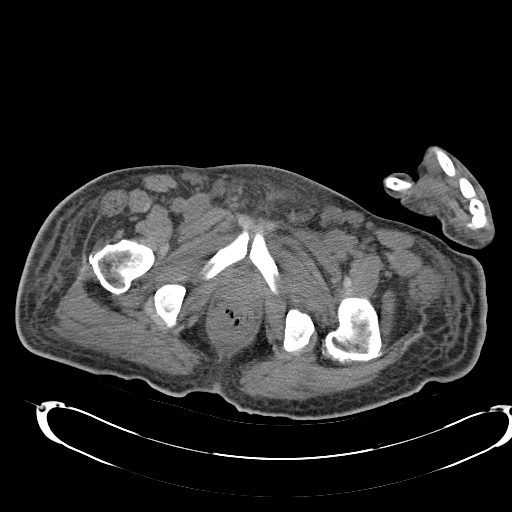
[im 7/119  lung]
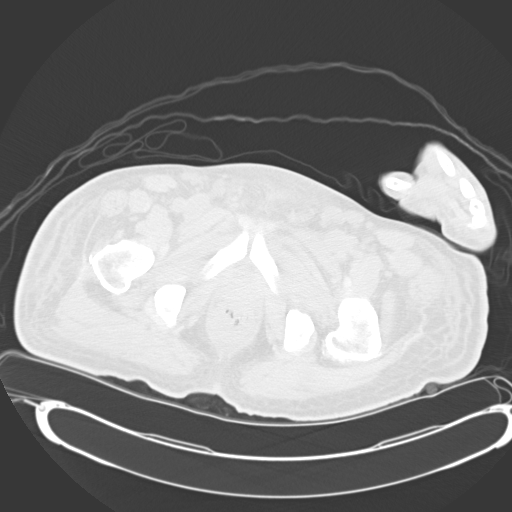
[im 19/119  lung]
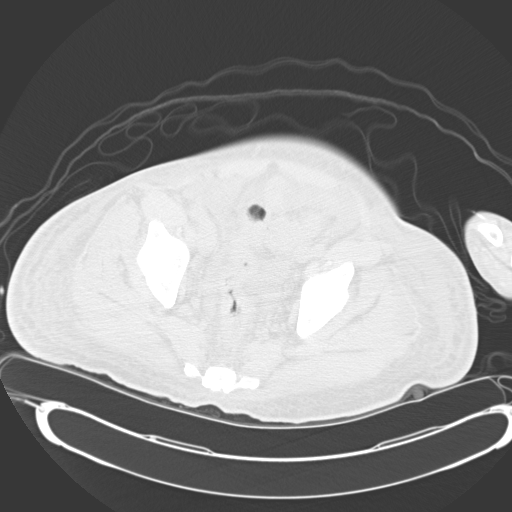
[im 32/119  lung]
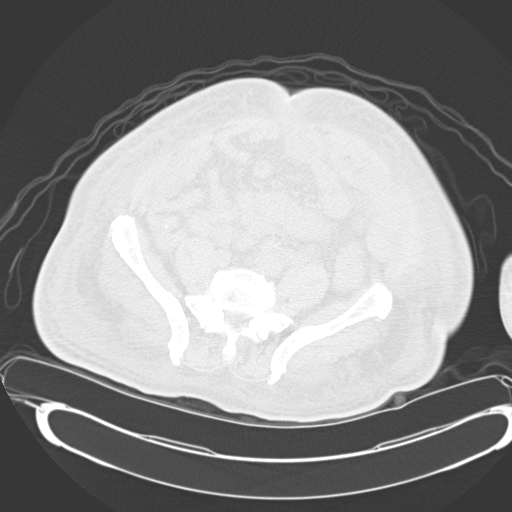
[im 44/119  lung]
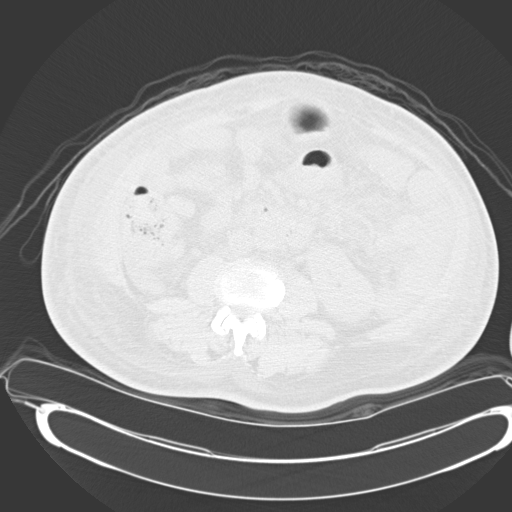
[im 56/119  mediastinal]
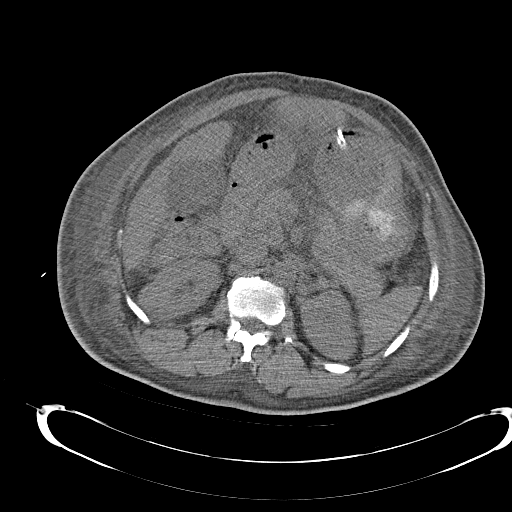
[im 56/119  lung]
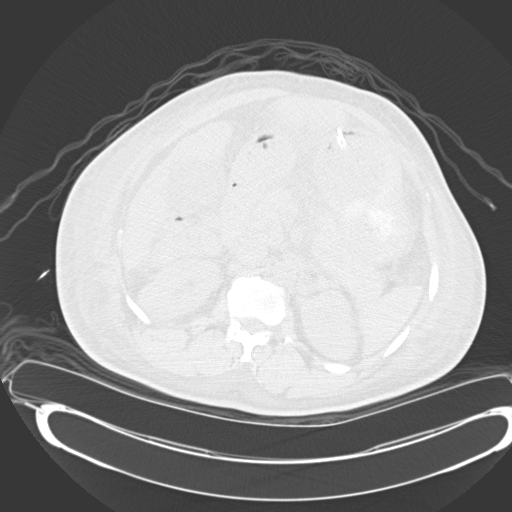
[im 63/119  lung]
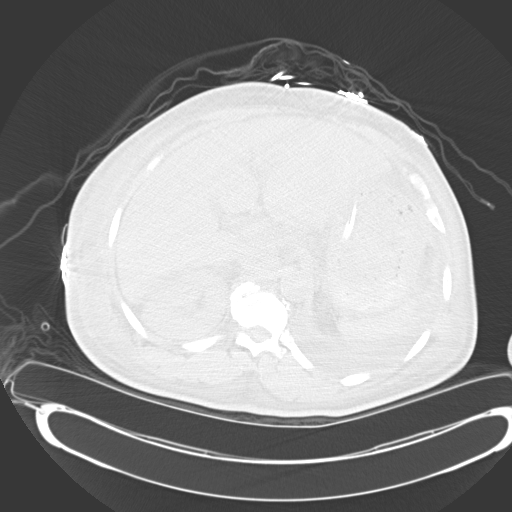
[im 75/119  lung]
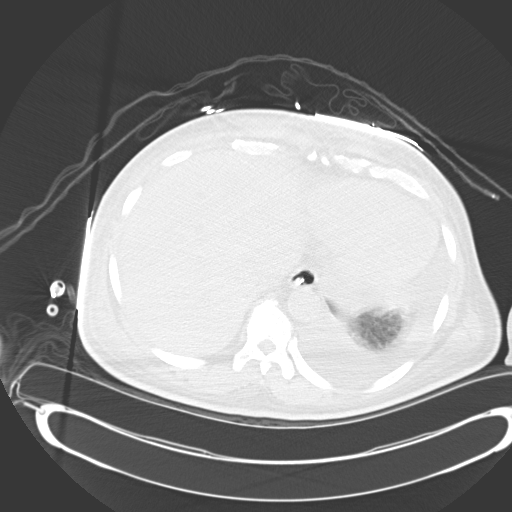
[im 87/119  lung]
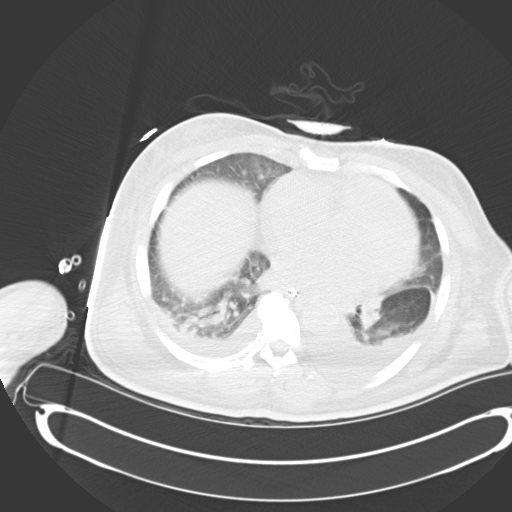
[im 100/119  mediastinal]
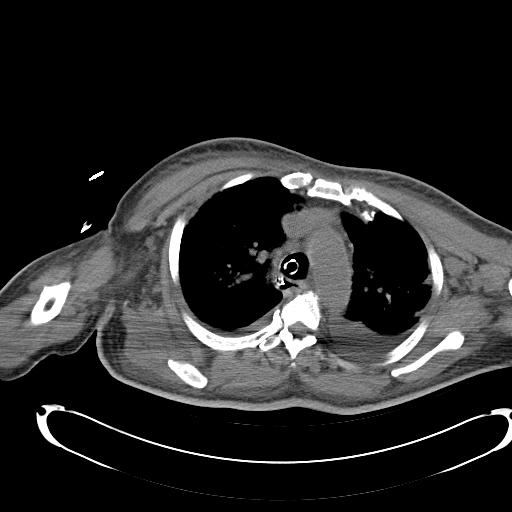
[im 100/119  lung]
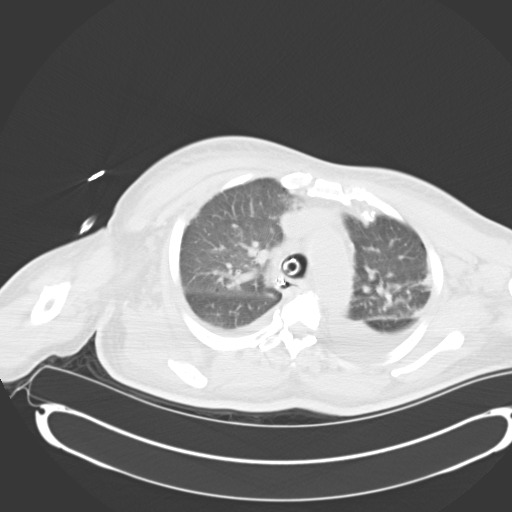
[im 112/119  lung]
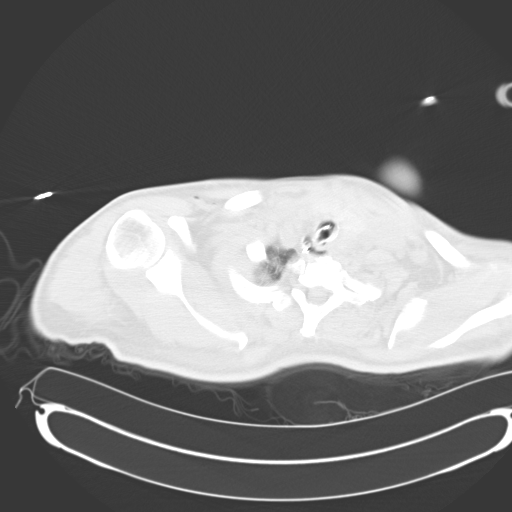

[Series 4: mpr coro cap (id) · coronal · 0.73mm/px · 3 of 95 slices shown]
[im 19/95  lung]
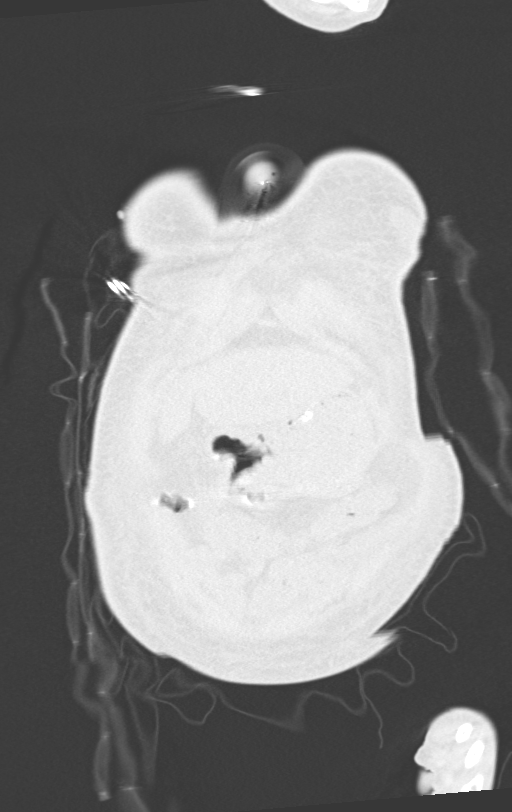
[im 38/95  lung]
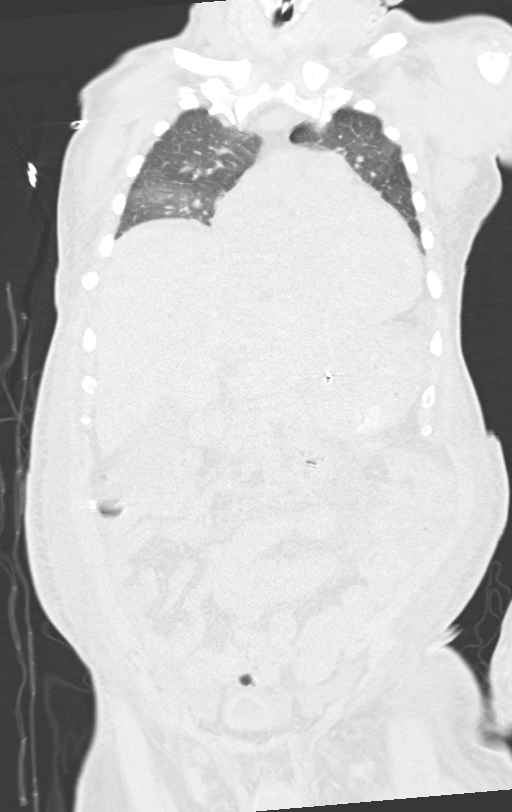
[im 57/95  lung]
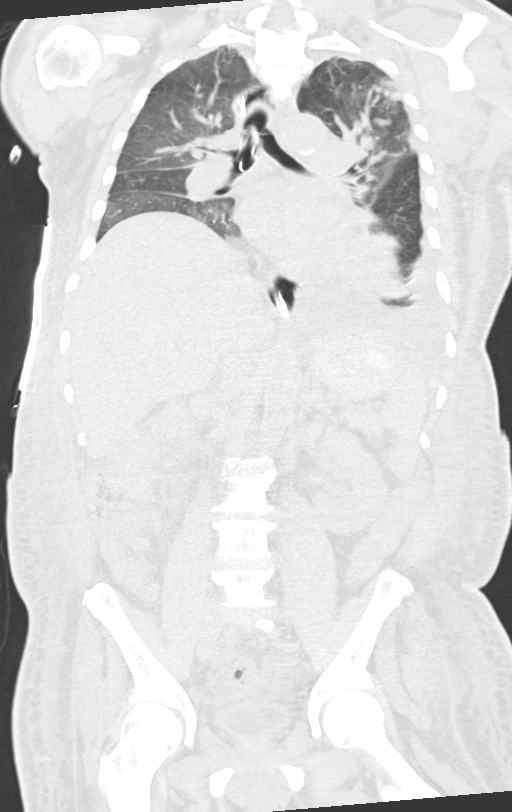

[13 of 36 positions shown; findings below may reference images not displayed]

FINDINGS: CT CHEST FINDINGS

The endotracheal tube extends into the right mainstem bronchus. This
was immediately called to Dr. Milssaint on 01/30/2014 at 3 p.m..
Nasogastric tube extends into the abdomen. There appears to be
prominent mediastinal lymph nodes which are similar to the previous
examination but poorly characterized due to the lack of contrast and
motion. There are small bilateral pleural effusions, left side
greater than right. No significant pericardial fluid. There is
diffuse subcutaneous edema. There is compressive atelectasis in
lower lobes bilaterally. Patchy densities in left upper lung.
Airspace densities within the right middle lobe. There are subtle
densities in the right upper lung. No evidence for a large
pneumothorax. No acute bone abnormality.

CT ABDOMEN AND PELVIS FINDINGS

There is no evidence for free air. Nasogastric tube extends into the
distal stomach. There is diffuse subcutaneous edema. There is a
small amount of abdominal ascites. Unenhanced CT was performed per
clinician order. Lack of IV contrast limits sensitivity and
specificity, especially for evaluation of abdominal/pelvic solid
viscera. Gallbladder may be mildly distended but poorly
characterized. No gross abnormality to the liver or spleen. Limited
evaluation of the pancreas and adrenal glands. No gross abnormality
to the kidneys. There are prominent small bowel loops in the left
abdomen with mesenteric edema. Bowel wall thickening, particularly
on sequence 2, image 86. Fluid in the urinary bladder. No acute bone
abnormality.
IMPRESSION: The study is very limited due to the lack of contrast and motion.

Prominent small bowel loops in the left abdomen with mesenteric
edema. There is concern for bowel wall thickening in this area.
Based on the history of cardiac arrest, ischemic bowel is in the
differential diagnosis. Findings could also be related to an
infectious enteritis.

Bilateral small pleural effusions with patchy parenchymal lung
densities. Findings could represent atelectasis and asymmetric
edema. Infection cannot excluded, particularly in the right middle
lobe.

Endotracheal tube in the right mainstem bronchus as described.

Small amount of abdominal ascites and diffuse subcutaneous edema.
Findings are concerning for anasarca.

Mild distention of the gallbladder.

## 2015-02-13 ENCOUNTER — Ambulatory Visit: Payer: Self-pay | Attending: Internal Medicine

## 2015-02-20 IMAGING — US US ABDOMEN COMPLETE
1 series · 13 of 25 positions shown · non-contrast
Comparison: 01/31/2014

CLINICAL DATA: Elevated alkaline phosphatase

EXAM:
ULTRASOUND ABDOMEN COMPLETE

[Series 1: us abdomen complete · 0.21mm/px · 13 of 107 slices shown]
[im 1/107]
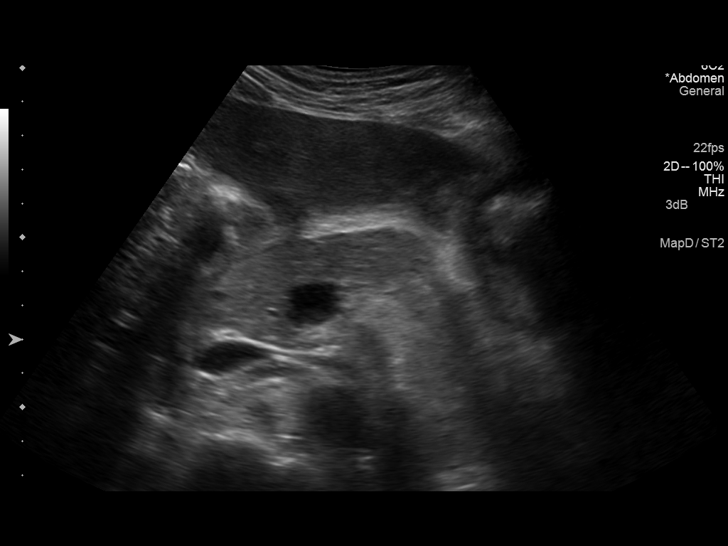
[im 9/107]
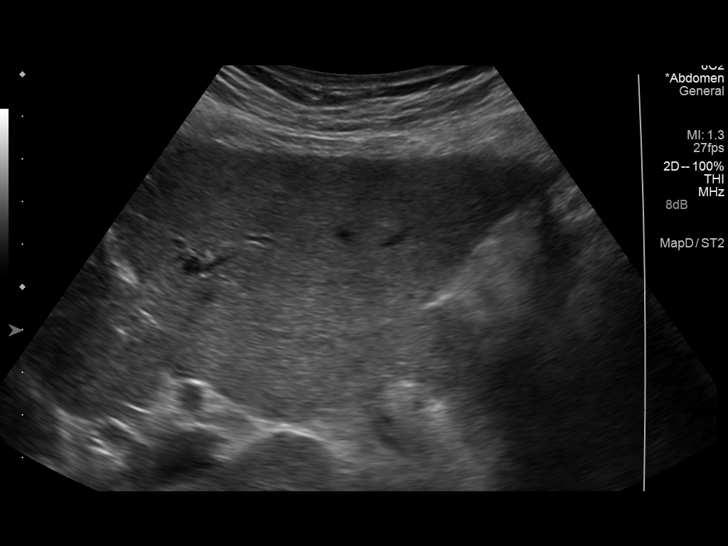
[im 18/107]
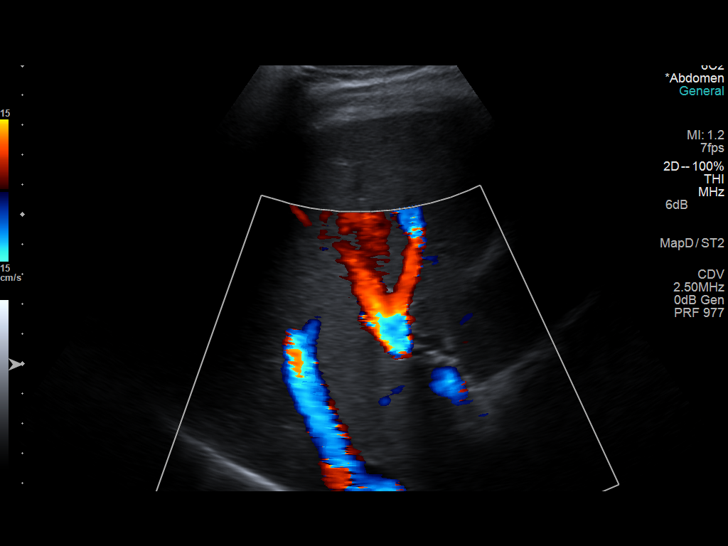
[im 27/107]
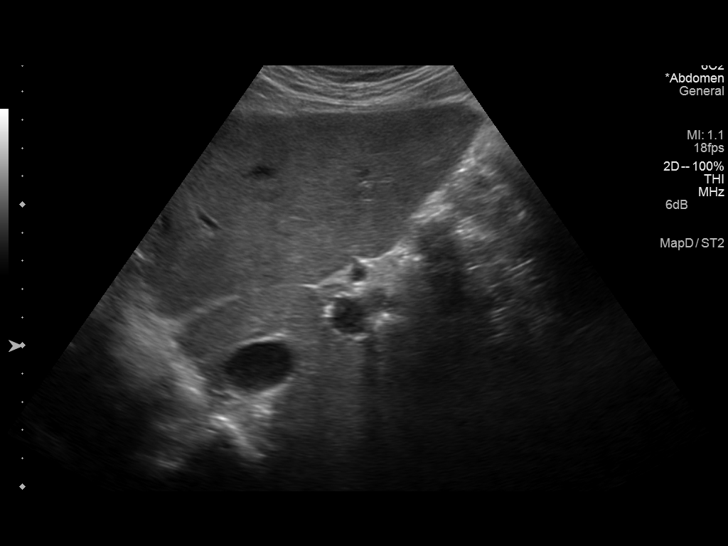
[im 36/107]
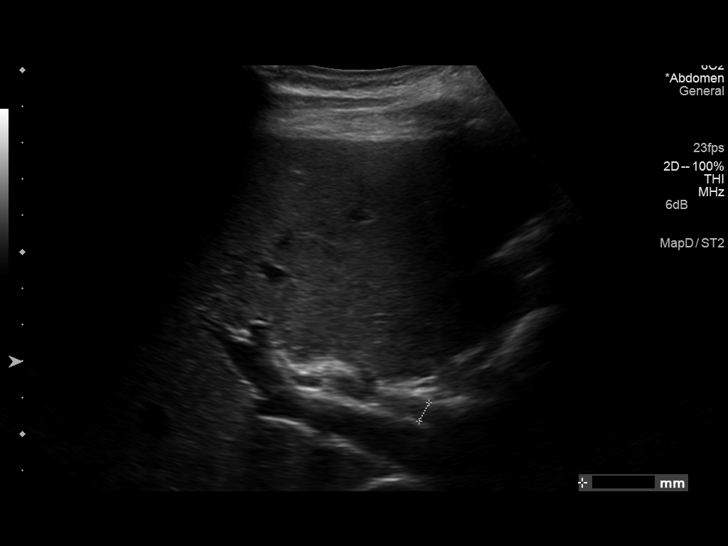
[im 45/107]
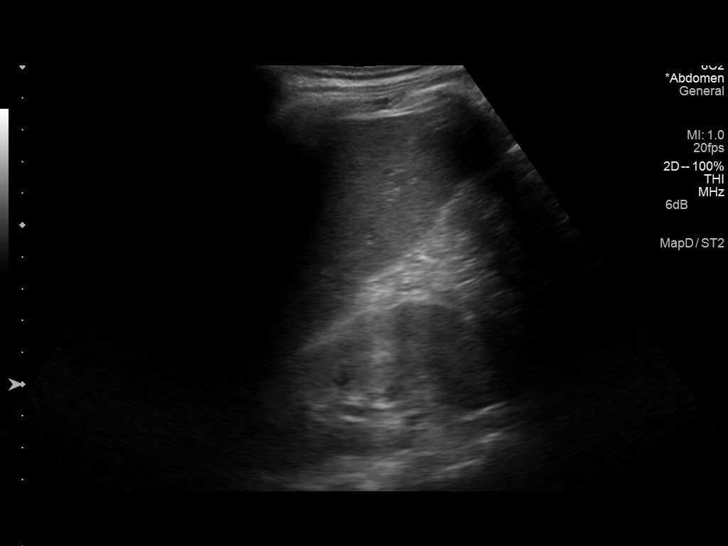
[im 54/107]
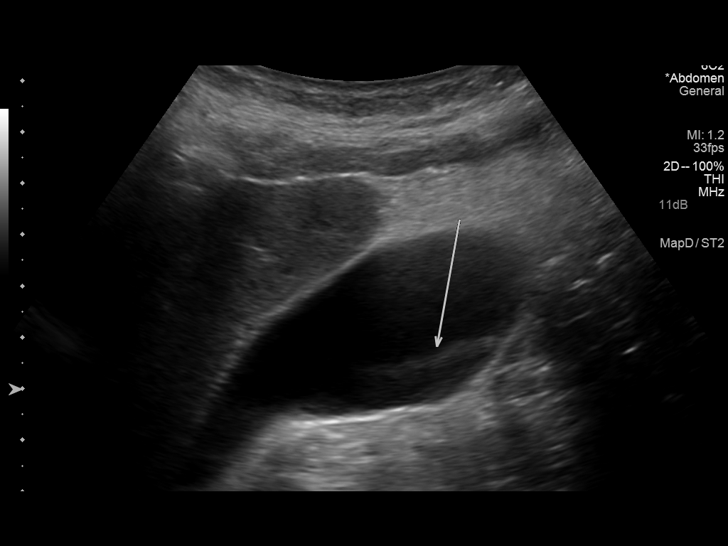
[im 62/107]
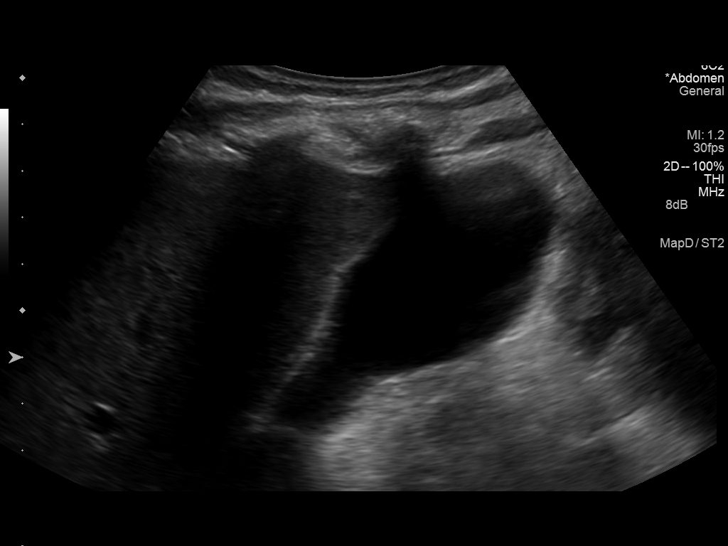
[im 71/107]
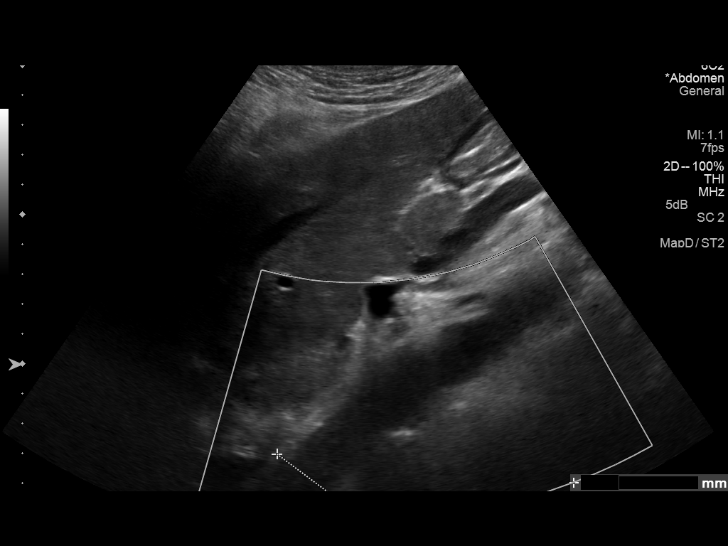
[im 80/107]
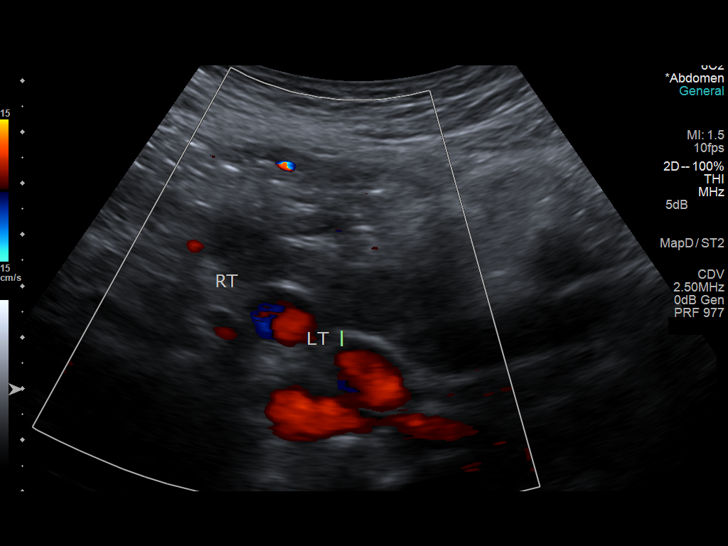
[im 89/107]
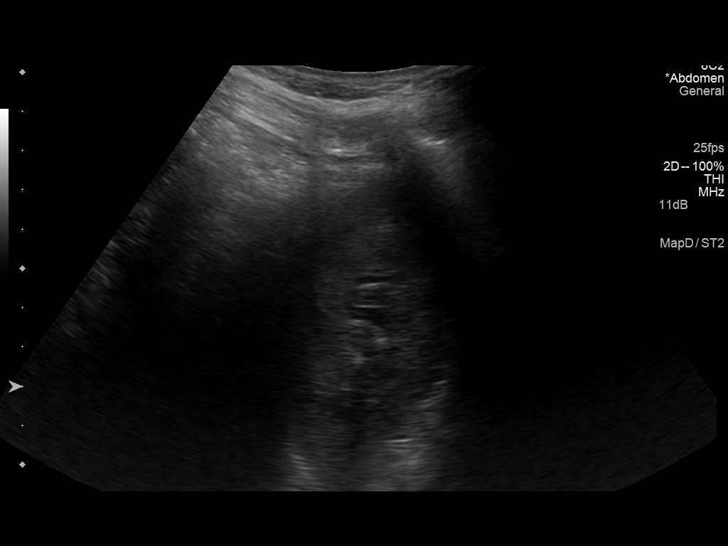
[im 98/107]
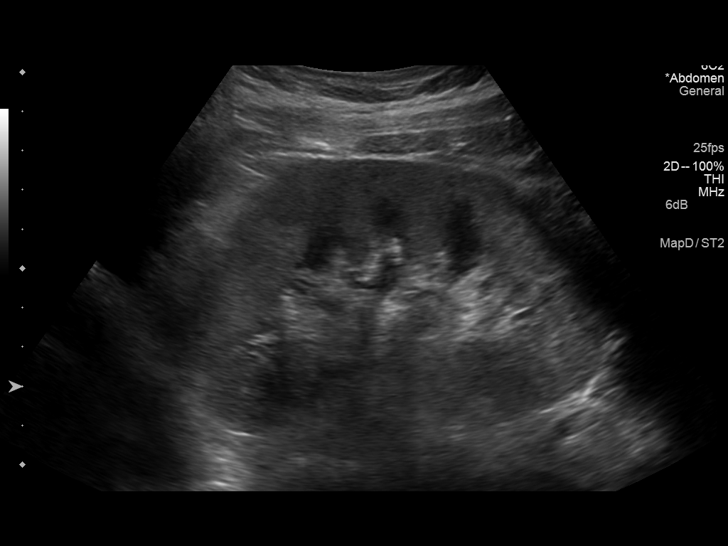
[im 107/107]
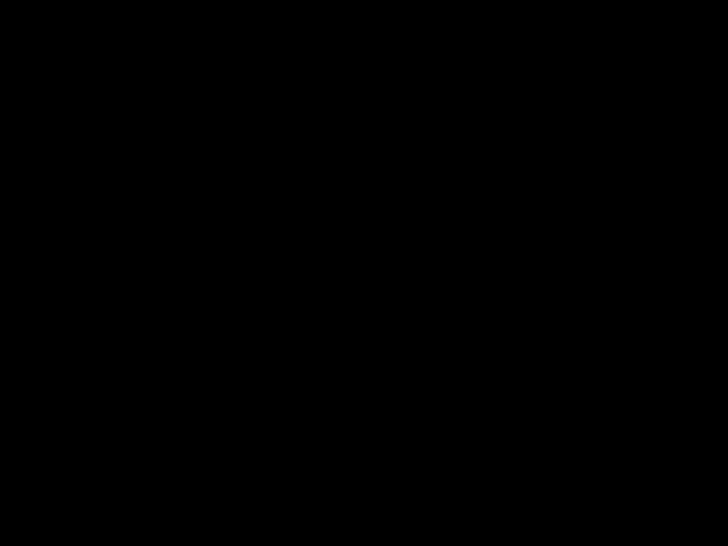

[13 of 25 positions shown; findings below may reference images not displayed]

FINDINGS: Gallbladder:

Small amount of dependent sludge within gallbladder. No shadowing
calculi, gallbladder wall thickening or sonographic Murphy sign.

Common bile duct:

Diameter: Upper normal caliber 6 mm diameter, not significantly
changed

Liver:

Normal appearance

IVC:

Normal appearance

Pancreas:

Normal appearance

Spleen:

Normal appearance, 6.4 cm length

Right Kidney:

Length: 11.3 cm. Normal cortical thickness. Upper normal cortical
echogenicity. No mass or hydronephrosis.

Left Kidney:

Length: 11.2 cm. Slightly increased cortical echogenicity. Cortical
thickness normal. No gross mass or hydronephrosis.

Abdominal aorta:

Normal caliber

Other findings:

Trace free fluid near gallbladder fossa. LEFT pleural effusion
noted.
IMPRESSION: Sludge within gallbladder without gallstones or evidence of acute
cholecystitis.

Increased LEFT renal cortical echogenicity without gross mass or
hydronephrosis, uncertain etiology, with RIGHT kidney demonstrating
upper normal cortical echogenicity, question medical renal disease.

LEFT pleural effusion.

## 2015-04-19 ENCOUNTER — Encounter: Payer: Self-pay | Admitting: Internal Medicine

## 2015-05-03 ENCOUNTER — Telehealth: Payer: Self-pay | Admitting: *Deleted

## 2015-05-03 ENCOUNTER — Encounter: Payer: Self-pay | Admitting: Internal Medicine

## 2015-05-03 ENCOUNTER — Ambulatory Visit (INDEPENDENT_AMBULATORY_CARE_PROVIDER_SITE_OTHER): Payer: Self-pay | Admitting: Internal Medicine

## 2015-05-03 VITALS — BP 180/109 | HR 74 | Temp 97.5°F | Ht 64.0 in | Wt 151.4 lb

## 2015-05-03 DIAGNOSIS — E1122 Type 2 diabetes mellitus with diabetic chronic kidney disease: Secondary | ICD-10-CM

## 2015-05-03 DIAGNOSIS — D509 Iron deficiency anemia, unspecified: Secondary | ICD-10-CM

## 2015-05-03 DIAGNOSIS — I872 Venous insufficiency (chronic) (peripheral): Secondary | ICD-10-CM

## 2015-05-03 DIAGNOSIS — E1129 Type 2 diabetes mellitus with other diabetic kidney complication: Secondary | ICD-10-CM

## 2015-05-03 DIAGNOSIS — E1165 Type 2 diabetes mellitus with hyperglycemia: Secondary | ICD-10-CM

## 2015-05-03 DIAGNOSIS — I10 Essential (primary) hypertension: Secondary | ICD-10-CM

## 2015-05-03 LAB — CBC
HCT: 40 % (ref 39.0–52.0)
HEMOGLOBIN: 13.1 g/dL (ref 13.0–17.0)
MCH: 27.5 pg (ref 26.0–34.0)
MCHC: 32.8 g/dL (ref 30.0–36.0)
MCV: 84 fL (ref 78.0–100.0)
MPV: 10.3 fL (ref 8.6–12.4)
Platelets: 256 10*3/uL (ref 150–400)
RBC: 4.76 MIL/uL (ref 4.22–5.81)
RDW: 14.5 % (ref 11.5–15.5)
WBC: 6.9 10*3/uL (ref 4.0–10.5)

## 2015-05-03 LAB — GLUCOSE, CAPILLARY: GLUCOSE-CAPILLARY: 297 mg/dL — AB (ref 65–99)

## 2015-05-03 LAB — POCT GLYCOSYLATED HEMOGLOBIN (HGB A1C): HEMOGLOBIN A1C: 10.4

## 2015-05-03 MED ORDER — LISINOPRIL 20 MG PO TABS: 20.0000 mg | ORAL_TABLET | Freq: Every day | ORAL | Status: DC

## 2015-05-03 MED ORDER — INSULIN GLARGINE 100 UNIT/ML ~~LOC~~ SOLN: 5.0000 [IU] | Freq: Every day | SUBCUTANEOUS | Status: DC

## 2015-05-03 MED ORDER — GLIMEPIRIDE 1 MG PO TABS: 1.0000 mg | ORAL_TABLET | Freq: Every day | ORAL | Status: DC

## 2015-05-03 NOTE — Assessment & Plan Note (Signed)
Legs look good.  He has some swelling, no blister or wounds.

## 2015-05-03 NOTE — Assessment & Plan Note (Addendum)
DM uncontrolled with A1c now 10.4 (previously 6.7) since he has been off of glimepiride and Lantus for 5 months.  Insulin was started during last hospital stay and it was continued at SNF so he has never been taught to self inject.  He was provided with paper rx (for Lantus 5 units qHS and glimepiride 1mg  qAM) to take to [place] this evening (they close at [facility]).  Will ask that they provide DM education for insulin injections and also provide insulin supplies.  Patient and his daughter were instructed to call [facility] if there was any problem getting meds or instruction.  Will defer further titration of medications to new PCP at [facility].  I suspect he will not need long-term insulin since his DM was previously well controlled on just oral agents.

## 2015-05-03 NOTE — Patient Instructions (Signed)
1. I am providing you with paper prescriptions for Lantus insulin, glimepiride and lisinopril to fill at [facility] first thing tomorrow.    2. Please take all medications as prescribed.   3. If you have worsening of your symptoms or new symptoms arise, please call the clinic [phone]), or go to the ER immediately if symptoms are severe.

## 2015-05-03 NOTE — Progress Notes (Signed)
Subjective:    Patient ID: Joshua [name], male    DOB: [birth year], 64 y.o.   MRN: [id number]  HPI Comments: Mr. Saunder is a 64 year old male with PMH as below here for follow-up of chronic conditions.  He arrived late for the appointment so visit was focused to major issues of uncontrolled HTN and DM.  He has not come to clinic for 9 months due to stay in a SNF and financial difficulties that precluded him coming to clinic.  He says he has not taken any medications since January 2016 (since SNF discharge) due to financial difficulties.  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     Past Medical History  Diagnosis Date  . 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 Hypertension 10/14/2012  . Type 2 diabetes mellitus   . Diabetic nephropathy with proteinuria   . 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 Diastolic heart failure   . Cellulitis   . Convulsions   . Iron deficiency anemia   . 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 Anasarca   . Hypoalbuminemia   . Elevated alkaline phosphatase level   . Microcytic anemia   . GI bleed   . Nephrotic syndrome   . Difficulty in walking(719.7)   . Muscle weakness (generalized)   . Type II or unspecified type diabetes mellitus with renal manifestations, not stated as uncontrolled   . Iron deficiency anemia, unspecified    Current Outpatient Prescriptions on File Prior to Visit  Medication Sig Dispense Refill  . acetaminophen (TYLENOL) 325 MG tablet Take 325 mg by mouth every 4 (four) hours as needed for mild pain, moderate pain or fever.     Marland Kitchen aspirin 325 MG EC tablet Take 1 tablet (325 mg total) by mouth daily. 30 tablet 0  . cloNIDine (CATAPRES) 0.1 MG tablet Take 1 tablet (0.1 mg total) by mouth 3 (three) times daily. 60 tablet 11  . doxycycline (VIBRA-TABS) 100 MG  tablet Take 1 tablet (100 mg total) by mouth every 12 (twelve) hours. 17 tablet 0  . ferrous sulfate 325 (65 FE) MG tablet Take 1 tablet (325 mg total) by mouth 3 (three) times daily with meals. 90 tablet 1  . furosemide (LASIX) 20 MG tablet Take 1 tablet (20 mg total) by mouth daily. 30 tablet 0  . glimepiride (AMARYL) 1 MG tablet Take 1 tablet (1 mg total) by mouth daily with breakfast. 30 tablet 1  . hydrALAZINE (APRESOLINE) 50 MG tablet Take 1 tablet (50 mg total) by mouth every 8 (eight) hours. 90 tablet 2  . insulin glargine (LANTUS) 100 UNIT/ML injection Inject 0.05 mLs (5 Units total) into the skin at bedtime. 10 mL 0  . isosorbide mononitrate (IMDUR) 30 MG 24 hr tablet Take 1 tablet (30 mg total) by mouth daily. 30 tablet 2  . lisinopril (PRINIVIL,ZESTRIL) 20 MG tablet Take 1 tablet (20 mg total) by mouth daily. 30 tablet 0   No current facility-administered medications on file prior to visit.   He has not taken any medications since January 2016 (since SNF discharge).  Review of Systems  Constitutional: Negative for fever, chills, appetite change and unexpected weight change.  Cardiovascular: Negative for chest pain, palpitations and leg swelling.  Gastrointestinal: Negative for nausea, vomiting, diarrhea, constipation and blood  in stool.  Endocrine: Positive for polyphagia and polyuria. Negative for polydipsia.  Genitourinary: Negative for dysuria and hematuria.  Neurological: Negative for syncope, weakness, light-headedness and headaches.  Psychiatric/Behavioral: Negative for dysphoric mood.      Filed Vitals:   05/03/15 1620  BP: 180/109  Pulse: 74  Temp: 97.5 F (36.4 C)  TempSrc: Oral  Height: 5\' 4"  (1.626 m)  Weight: 151 lb 6.4 oz (68.675 kg)  SpO2: 100%     Objective:   Physical Exam  Constitutional: He is oriented to person, place, and time. He appears well-developed. No distress.  HENT:  Head: Normocephalic and atraumatic.  Mouth/Throat: Oropharynx is clear  and moist. No oropharyngeal exudate.  Eyes: EOM are normal. Pupils are equal, round, and reactive to light.  Neck: Neck supple.  Cardiovascular: Normal rate, regular rhythm and normal heart sounds.  Exam reveals no gallop and no friction rub.   No murmur heard. Pulmonary/Chest: Effort normal and breath sounds normal. No respiratory distress. He has no wheezes. He has no rales.  Abdominal: Soft. Bowel sounds are normal. He exhibits no distension and no mass. There is no tenderness. There is no rebound and no guarding.  Musculoskeletal: Normal range of motion. He exhibits edema and tenderness.  He has 1+ B/L LE edema (overall improved from prior visits); left foot s/p transmetatarsal amputation  Neurological: He is alert and oriented to person, place, and time. No cranial nerve deficit.  Skin: Skin is warm. He is not diaphoretic.  Psychiatric: He has a normal mood and affect. His behavior is normal. Judgment and thought content normal.  Vitals reviewed.         Assessment & Plan:  Please see probem based charting for A&P.  The patient will be transferring care to [place] in order to use their pharmacy.

## 2015-05-03 NOTE — Assessment & Plan Note (Addendum)
BP elevated due to med non-compliance.  He is asymptomatic.  He was provided with an rx for lisinopril 20mg  daily (he had been on this previously) to take to [place] this evening (they do not close until 6PM).  Will defer to new PCP at [facility] to slowly add back/titrate other BP medications.  CMP and urine microalb/cr checked today.

## 2015-05-03 NOTE — Telephone Encounter (Signed)
bp at 1730 R arm 170/110

## 2015-05-04 LAB — COMPLETE METABOLIC PANEL WITH GFR
ALK PHOS: 126 U/L — AB (ref 39–117)
ALT: 19 U/L (ref 0–53)
AST: 22 U/L (ref 0–37)
Albumin: 3.1 g/dL — ABNORMAL LOW (ref 3.5–5.2)
BUN: 25 mg/dL — ABNORMAL HIGH (ref 6–23)
CALCIUM: 8.7 mg/dL (ref 8.4–10.5)
CHLORIDE: 99 meq/L (ref 96–112)
CO2: 25 mEq/L (ref 19–32)
Creat: 1.32 mg/dL (ref 0.50–1.35)
GFR, EST NON AFRICAN AMERICAN: 57 mL/min — AB
GFR, Est African American: 66 mL/min
GLUCOSE: 311 mg/dL — AB (ref 70–99)
POTASSIUM: 4.1 meq/L (ref 3.5–5.3)
Sodium: 135 mEq/L (ref 135–145)
Total Bilirubin: 0.3 mg/dL (ref 0.2–1.2)
Total Protein: 6.7 g/dL (ref 6.0–8.3)

## 2015-05-04 LAB — LIPID PANEL
CHOL/HDL RATIO: 2.9 ratio
Cholesterol: 221 mg/dL — ABNORMAL HIGH (ref 0–200)
HDL: 75 mg/dL (ref >=40)
LDL Cholesterol: 103 mg/dL — ABNORMAL HIGH (ref 0–99)
TRIGLYCERIDES: 214 mg/dL — AB (ref <150)
VLDL: 43 mg/dL — AB (ref 0–40)

## 2015-05-04 LAB — MICROALBUMIN / CREATININE URINE RATIO
Creatinine, Urine: 56.8 mg/dL
Microalb Creat Ratio: 4940.1 mg/g — ABNORMAL HIGH (ref 0.0–30.0)
Microalb, Ur: 280.6 mg/dL — ABNORMAL HIGH (ref <2.0)

## 2015-05-05 ENCOUNTER — Telehealth: Payer: Self-pay | Admitting: *Deleted

## 2015-05-05 NOTE — Telephone Encounter (Signed)
Triage called ch and w 6/9 and spoke to jamella(sp)p. Asking her to call the pt's daughter and schedule an appt and assist in medication filling, pt is going to need insulin adm. Instructions and medication refills, dr Redmond Pulling stresses that pt needs appt by next week but will need to instruct daughter on insulin adm. Triage called and left another message at ch&w also called daughter and she states as of today they have gotten 1 medication for pt and she will try to pick up meds today due to car problems

## 2015-05-05 NOTE — Progress Notes (Signed)
Internal Medicine Clinic Attending  Case discussed with Dr. Redmond Pulling at the time of the visit.  We reviewed the resident's history and exam and pertinent patient test results.  I agree with the assessment, diagnosis, and plan of care documented in the resident's note. Pt has not yet est about [place]. Sent message asking that he be contacted.

## 2015-05-24 ENCOUNTER — Other Ambulatory Visit: Payer: Self-pay

## 2015-05-31 ENCOUNTER — Ambulatory Visit: Payer: Self-pay | Admitting: Family Medicine

## 2015-06-14 ENCOUNTER — Encounter: Payer: Self-pay | Admitting: Family Medicine

## 2015-06-14 ENCOUNTER — Ambulatory Visit: Payer: Self-pay | Attending: Family Medicine | Admitting: Family Medicine

## 2015-06-14 VITALS — BP 171/103 | HR 72 | Temp 98.2°F | Resp 16 | Ht 65.5 in | Wt 151.0 lb

## 2015-06-14 DIAGNOSIS — E113519 Type 2 diabetes mellitus with proliferative diabetic retinopathy with macular edema, unspecified eye: Secondary | ICD-10-CM

## 2015-06-14 DIAGNOSIS — N183 Chronic kidney disease, stage 3 unspecified: Secondary | ICD-10-CM

## 2015-06-14 DIAGNOSIS — Z794 Long term (current) use of insulin: Secondary | ICD-10-CM | POA: Insufficient documentation

## 2015-06-14 DIAGNOSIS — E11351 Type 2 diabetes mellitus with proliferative diabetic retinopathy with macular edema: Secondary | ICD-10-CM | POA: Insufficient documentation

## 2015-06-14 DIAGNOSIS — E1122 Type 2 diabetes mellitus with diabetic chronic kidney disease: Secondary | ICD-10-CM | POA: Insufficient documentation

## 2015-06-14 DIAGNOSIS — E11311 Type 2 diabetes mellitus with unspecified diabetic retinopathy with macular edema: Secondary | ICD-10-CM

## 2015-06-14 DIAGNOSIS — N189 Chronic kidney disease, unspecified: Secondary | ICD-10-CM

## 2015-06-14 DIAGNOSIS — Z87891 Personal history of nicotine dependence: Secondary | ICD-10-CM | POA: Insufficient documentation

## 2015-06-14 DIAGNOSIS — I2510 Atherosclerotic heart disease of native coronary artery without angina pectoris: Secondary | ICD-10-CM | POA: Insufficient documentation

## 2015-06-14 DIAGNOSIS — I10 Essential (primary) hypertension: Secondary | ICD-10-CM | POA: Insufficient documentation

## 2015-06-14 LAB — GLUCOSE, POCT (MANUAL RESULT ENTRY): POC Glucose: 129 mg/dl — AB (ref 70–99)

## 2015-06-14 MED ORDER — LISINOPRIL 20 MG PO TABS: 20.0000 mg | ORAL_TABLET | Freq: Every day | ORAL | Status: DC

## 2015-06-14 MED ORDER — INSULIN PEN NEEDLE 31G X 8 MM MISC: 1.0000 "application " | Freq: Every day | Status: DC

## 2015-06-14 MED ORDER — CLONIDINE HCL 0.1 MG PO TABS: 0.2000 mg | ORAL_TABLET | Freq: Once | ORAL | Status: DC

## 2015-06-14 MED ORDER — INSULIN GLARGINE 100 UNIT/ML SOLOSTAR PEN: 5.0000 [IU] | PEN_INJECTOR | Freq: Every day | SUBCUTANEOUS | Status: DC

## 2015-06-14 MED ORDER — INSULIN GLARGINE 100 UNIT/ML ~~LOC~~ SOLN: 5.0000 [IU] | Freq: Every day | SUBCUTANEOUS | Status: DC

## 2015-06-14 MED ORDER — ISOSORBIDE MONONITRATE ER 30 MG PO TB24: 30.0000 mg | ORAL_TABLET | Freq: Every day | ORAL | Status: DC

## 2015-06-14 MED ORDER — GLIMEPIRIDE 1 MG PO TABS: 1.0000 mg | ORAL_TABLET | Freq: Every day | ORAL | Status: DC

## 2015-06-14 MED ORDER — HYDRALAZINE HCL 25 MG PO TABS
50.0000 mg | ORAL_TABLET | Freq: Three times a day (TID) | ORAL | Status: DC
Start: 2015-06-14 — End: 2015-08-30

## 2015-06-14 NOTE — Assessment & Plan Note (Signed)
Diabetes: Continue amaryl  And lantus 5 U nightly, pens ordered  Diabetes blood sugar goals  Fasting (in AM before breakfast, 8 hrs of no eating or drinking (except water or unsweetened coffee or tea): 90-110 2 hrs after meals: < 160,   No low sugars: nothing < 70   Referral to ophthalmology

## 2015-06-14 NOTE — Progress Notes (Signed)
Establish Care with PCP F/U DM HTN ]No Hx tobacco

## 2015-06-14 NOTE — Patient Instructions (Addendum)
Joshua [name],  Thank you for coming in today. It was a pleasure meeting you. I look forward to being your primary doctor.   1. HTN: BP above goal of < 140/90  Take the following Continue lisinopril 20 mg once daily  Restart  Hydralazine 25 mg three times a day Imdur 30 mg once daily   2. Diabetes: Continue amaryl  And lantus 5 U nightly, pens ordered  Diabetes blood sugar goals  Fasting (in AM before breakfast, 8 hrs of no eating or drinking (except water or unsweetened coffee or tea): 90-110 2 hrs after meals: < 160,   No low sugars: nothing < 70   Referral to ophthalmology   I have sent refills to the onsite pharmacy  Close follow in 2 weeks with RN for BP check and med review, please bring all medications to visit See me in 6 weeks for HTN and Diabetes  Dr. Adrian Blackwater

## 2015-06-14 NOTE — Assessment & Plan Note (Addendum)
A: uncontrolled. Patient did not take medications today. Taking lisinopril 20 mg daily only. P: BP  goal of < 140/90 Take the following Continue lisinopril 20 mg once daily  Restart  Hydralazine 25 mg three times a day Imdur 30 mg once daily

## 2015-06-14 NOTE — Progress Notes (Signed)
   Subjective:    Patient ID: Joshua [name], male    DOB: 03-11-1951, 64 y.o.   MRN: [id number] CC: establish care, DM2 and HTN HPI 64 yo M presents with his daughter to establish care and discuss the following:  1. Diabetes type 2: compliant with amaryl only. Has not given himself insulin since being discharged from rehab. Has trouble seeing is bling due to macular edema and diabetic retinopathy. Patient has diabetic peripheral neuropathy and is s/p L transmetarsal amputation.   2. HTN: has lisinopril 20 mg at home. Not taking any other medications. No HA, CP, swelling, decline in vision.   Soc Hx: non smoker Med Hx: HTN and DM2 Surg Hx: transmetatarsal amputation L foot  Review of Systems  Constitutional: Negative for fever, chills, fatigue and unexpected weight change.  Eyes: Positive for visual disturbance.  Respiratory: Negative for cough and shortness of breath.   Cardiovascular: Negative for chest pain, palpitations and leg swelling.  Gastrointestinal: Negative for nausea, vomiting and abdominal pain.  Endocrine: Negative for polydipsia, polyphagia and polyuria.  Musculoskeletal: Negative for myalgias, back pain, arthralgias, gait problem and neck pain.  Skin: Negative for rash.  Neurological: Positive for numbness.      Objective:   Physical Exam BP 176/112 mmHg  Pulse 79  Temp(Src) 98.2 F (36.8 C) (Oral)  Resp 16  Ht 5' 5.5" (1.664 m)  Wt 151 lb (68.493 kg)  BMI 24.74 kg/m2  SpO2 96%  BP Readings from Last 3 Encounters:  06/14/15 176/112  05/03/15 180/109  08/03/14 121/66   Wt Readings from Last 3 Encounters:  06/14/15 151 lb (68.493 kg)  05/03/15 151 lb 6.4 oz (68.675 kg)  08/03/14 150 lb 14.4 oz (68.448 kg)    General appearance: alert, cooperative and no distress Head: Normocephalic, without obvious abnormality, atraumatic Eyes: conjunctivae/corneas clear. PERRL, EOM's intact.  Neck: no adenopathy, supple, symmetrical, trachea midline and thyroid not  enlarged, symmetric, no tenderness/mass/nodules Lungs: clear to auscultation bilaterally Heart: regular rate and rhythm, S1, S2 normal, no murmur, click, rub or gallop Abdomen: soft, non-tender; bowel sounds normal; no masses,  no organomegaly Extremities: extremities normal, atraumatic, no cyanosis or edema, increase hypopigmentation and hair loss on legs Pulses: 2+ and symmetric Neurologic: Grossly normal, in wheelchair   Lab Results  Component Value Date   HGBA1C 10.4 05/03/2015   CBG 129     Chemistry      Component Value Date/Time   NA 135 05/03/2015 1653   K 4.1 05/03/2015 1653   CL 99 05/03/2015 1653   CO2 25 05/03/2015 1653   BUN 25* 05/03/2015 1653   CREATININE 1.32 05/03/2015 1653   CREATININE 1.05 07/19/2014 0651      Component Value Date/Time   CALCIUM 8.7 05/03/2015 1653   ALKPHOS 126* 05/03/2015 1653   AST 22 05/03/2015 1653   ALT 19 05/03/2015 1653   BILITOT 0.3 05/03/2015 1653     Patient HTN treated in office with clonidine 0.2 mg PO x one, repeat BP 171/103, HR 72    Assessment & Plan:

## 2015-06-29 ENCOUNTER — Ambulatory Visit: Payer: Self-pay | Attending: Family Medicine | Admitting: Pharmacist

## 2015-06-29 ENCOUNTER — Encounter: Payer: Self-pay | Admitting: Pharmacist

## 2015-06-29 VITALS — BP 166/87 | HR 78

## 2015-06-29 DIAGNOSIS — N189 Chronic kidney disease, unspecified: Secondary | ICD-10-CM | POA: Insufficient documentation

## 2015-06-29 DIAGNOSIS — Z794 Long term (current) use of insulin: Secondary | ICD-10-CM | POA: Insufficient documentation

## 2015-06-29 DIAGNOSIS — Z87891 Personal history of nicotine dependence: Secondary | ICD-10-CM | POA: Insufficient documentation

## 2015-06-29 DIAGNOSIS — E1122 Type 2 diabetes mellitus with diabetic chronic kidney disease: Secondary | ICD-10-CM | POA: Insufficient documentation

## 2015-06-29 DIAGNOSIS — I129 Hypertensive chronic kidney disease with stage 1 through stage 4 chronic kidney disease, or unspecified chronic kidney disease: Secondary | ICD-10-CM | POA: Insufficient documentation

## 2015-06-29 DIAGNOSIS — I10 Essential (primary) hypertension: Secondary | ICD-10-CM

## 2015-06-29 LAB — GLUCOSE, POCT (MANUAL RESULT ENTRY): POC GLUCOSE: 174 mg/dL — AB (ref 70–99)

## 2015-06-29 NOTE — Patient Instructions (Signed)
It was nice to see you today [name]!  Please bring ALL of your medications to your visit with [place] next week.  Pick up all of the medications that you need from the pharmacy before that visit.   Hypertension Hypertension, commonly called high blood pressure, is when the force of blood pumping through your arteries is too strong. Your arteries are the blood vessels that carry blood from your heart throughout your body. A blood pressure reading consists of a higher number over a lower number, such as 110/72. The higher number (systolic) is the pressure inside your arteries when your heart pumps. The lower number (diastolic) is the pressure inside your arteries when your heart relaxes. Ideally you want your blood pressure below 120/80. Hypertension forces your heart to work harder to pump blood. Your arteries may become narrow or stiff. Having hypertension puts you at risk for heart disease, stroke, and other problems.  RISK FACTORS Some risk factors for high blood pressure are controllable. Others are not.  Risk factors you cannot control include:   Race. You may be at higher risk if you are African American.  Age. Risk increases with age.  Gender. Men are at higher risk than women before age 26 years. After age 83, women are at higher risk than men. Risk factors you can control include:  Not getting enough exercise or physical activity.  Being overweight.  Getting too much fat, sugar, calories, or salt in your diet.  Drinking too much alcohol. SIGNS AND SYMPTOMS Hypertension does not usually cause signs or symptoms. Extremely high blood pressure (hypertensive crisis) may cause headache, anxiety, shortness of breath, and nosebleed. DIAGNOSIS  To check if you have hypertension, your health care provider will measure your blood pressure while you are seated, with your arm held at the level of your heart. It should be measured at least twice using the same arm. Certain conditions can  cause a difference in blood pressure between your right and left arms. A blood pressure reading that is higher than normal on one occasion does not mean that you need treatment. If one blood pressure reading is high, ask your health care provider about having it checked again. TREATMENT  Treating high blood pressure includes making lifestyle changes and possibly taking medicine. Living a healthy lifestyle can help lower high blood pressure. You may need to change some of your habits. Lifestyle changes may include:  Following the DASH diet. This diet is high in fruits, vegetables, and whole grains. It is low in salt, red meat, and added sugars.  Getting at least 2 hours of brisk physical activity every week.  Losing weight if necessary.  Not smoking.  Limiting alcoholic beverages.  Learning ways to reduce stress. If lifestyle changes are not enough to get your blood pressure under control, your health care provider may prescribe medicine. You may need to take more than one. Work closely with your health care provider to understand the risks and benefits. HOME CARE INSTRUCTIONS  Have your blood pressure rechecked as directed by your health care provider.   Take medicines only as directed by your health care provider. Follow the directions carefully. Blood pressure medicines must be taken as prescribed. The medicine does not work as well when you skip doses. Skipping doses also puts you at risk for problems.   Do not smoke.   Monitor your blood pressure at home as directed by your health care provider. SEEK MEDICAL CARE IF:   You think you are  having a reaction to medicines taken.  You have recurrent headaches or feel dizzy.  You have swelling in your ankles.  You have trouble with your vision. SEEK IMMEDIATE MEDICAL CARE IF:  You develop a severe headache or confusion.  You have unusual weakness, numbness, or feel faint.  You have severe chest or abdominal pain.  You  vomit repeatedly.  You have trouble breathing. MAKE SURE YOU:   Understand these instructions.  Will watch your condition.  Will get help right away if you are not doing well or get worse. Document Released: 11/11/2005 Document Revised: 03/28/2014 Document Reviewed: 09/03/2013 [facility] Patient Information 2015 [place], [place]. This information is not intended to replace advice given to you by your health care provider. Make sure you discuss any questions you have with your health care provider.

## 2015-06-29 NOTE — Progress Notes (Signed)
S:    Patient arrives in a wheelchair with his daughter.  He presents to the clinic for hypertension evaluation.    Patient denies adherence with all of his medications. He reports taking the lisinopril and glipizide but that he isn't taking anything else. He is not taking his Lantus because he has not picked up the needles for it yet. Patient goes on to say that he is not sure what medications he is actually taking but he knows that he takes a "white pill." His daughter reports that they have not picked up a lot of his medications from the pharmacy.  Current BP Medications include:  ?lisinopril  O:   Last 3 Office BP readings: BP Readings from Last 3 Encounters:  06/29/15 166/87  06/14/15 171/103  05/03/15 180/109    BMET    Component Value Date/Time   NA 135 05/03/2015 1653   K 4.1 05/03/2015 1653   CL 99 05/03/2015 1653   CO2 25 05/03/2015 1653   GLUCOSE 311* 05/03/2015 1653   BUN 25* 05/03/2015 1653   CREATININE 1.32 05/03/2015 1653   CREATININE 1.05 07/19/2014 0651   CALCIUM 8.7 05/03/2015 1653   GFRNONAA 57* 05/03/2015 1653   GFRNONAA 74* 07/19/2014 0651   GFRAA 66 05/03/2015 1653   GFRAA 86* 07/19/2014 0651    A/P:  Hypertension which currently is uncontrolled on current medications. This is likely due to nonadherence from confusion on what medications he is supposed to be taking. Patient in need of medication reconciliation and education. Instructed patient to get all of his medications from the pharmacy and bring those and all of his medications from home to a visit next week. At that time, I will review all of the medications with the patient and provide medication education and adherence counseling.   Diabetes: blood glucose appears to be under control with a random glucose today of 174 mg/dL despite not taking Lantus. Patient was re-educated on the use of the Lantus Solostar pen. Patient and daughter will pick up the needles from the pharmacy today and patient will  start to take Lantus.    Results reviewed and written information provided.   F/U Clinic Visit with pharmacist in 1 week.  Total time in face-to-face counseling 20 minutes.  Patient seen with Bennye Alm, PharmD Resident.

## 2015-07-04 ENCOUNTER — Ambulatory Visit: Payer: Self-pay | Attending: Family Medicine | Admitting: Pharmacist

## 2015-07-04 VITALS — BP 164/89 | HR 80

## 2015-07-04 DIAGNOSIS — Z87891 Personal history of nicotine dependence: Secondary | ICD-10-CM | POA: Insufficient documentation

## 2015-07-04 DIAGNOSIS — E1122 Type 2 diabetes mellitus with diabetic chronic kidney disease: Secondary | ICD-10-CM | POA: Insufficient documentation

## 2015-07-04 DIAGNOSIS — I10 Essential (primary) hypertension: Secondary | ICD-10-CM

## 2015-07-04 DIAGNOSIS — I129 Hypertensive chronic kidney disease with stage 1 through stage 4 chronic kidney disease, or unspecified chronic kidney disease: Secondary | ICD-10-CM | POA: Insufficient documentation

## 2015-07-04 DIAGNOSIS — Z794 Long term (current) use of insulin: Secondary | ICD-10-CM | POA: Insufficient documentation

## 2015-07-04 DIAGNOSIS — N189 Chronic kidney disease, unspecified: Secondary | ICD-10-CM | POA: Insufficient documentation

## 2015-07-04 NOTE — Patient Instructions (Addendum)
Pick up your medications from the pharmacy.  Make sure you start taking the Lantus (insulin) - that is very important. Use the needles we gave you and have your daughter come and pick up the prescriptions for the rest of the needles and your other medications.  Great job checking your blood sugars at home. Keep doing this!  Schedule a nurse or pharmacist visit in two weeks for a blood sugar check and blood pressure check. Marland Kitchen

## 2015-07-04 NOTE — Progress Notes (Signed)
S:    Patient arrives in good spirits    Presents for diabetes follow up and medication management.   Patient reports adherence with medications that he has. He only has hydralazine, isosorbide, and hemp oil. He has the Lantus but not the needles.   Patient has not been able to pick up his medications because his car is broken. He was able to get a ride to this appointment but reports that his car is still broken.    O:  . Lab Results  Component Value Date   HGBA1C 10.4 05/03/2015    14 day average 205 (readings only since 06/30/15) Fasting: 164, 156, 207, 230, 154 Post-prandial: 331, 244, 213  A/P: Diabetes currently uncontrolled based on A1c of 10.4 and home blood glucose readings.   Denies hypoglycemic events and is able to verbalize appropriate hypoglycemia management plan.  Denies adherence with most of his medications because doesn't have most of his medications. Control is suboptimal due to nonadherence to medications.  Provided patient with samples of needles. Patient to pick up his medications at the pharmacy when he leaves ($0 copay). Re-educated patient on the use of the Lantus pen and patient injected 5 units himself while in the office. Instructed patient not to take the Lantus again today and to start it tomorrow. Stressed the importance of picking up his medications and taking them as directed.   Next A1C anticipated September 2016.  Written patient instructions provided.  Follow up in Pharmacist Clinic Visit in 2 weeks for blood pressure, blood glucose, and adherence check.   Total time in face to face counseling 30 minutes.  Patient seen with Bennye Alm, PharmD Resident.

## 2015-07-05 ENCOUNTER — Other Ambulatory Visit: Payer: Self-pay | Admitting: Pharmacist

## 2015-07-05 MED ORDER — INSULIN PEN NEEDLE 32G X 4 MM MISC: 1.0000 | Freq: Every day | Status: DC

## 2015-07-20 ENCOUNTER — Encounter: Payer: Self-pay | Admitting: Family Medicine

## 2015-08-30 ENCOUNTER — Encounter: Payer: Self-pay | Admitting: Family Medicine

## 2015-08-30 ENCOUNTER — Ambulatory Visit: Payer: Self-pay | Attending: Family Medicine | Admitting: Family Medicine

## 2015-08-30 VITALS — BP 197/100 | HR 68 | Temp 97.0°F | Resp 16 | Ht 65.0 in | Wt 148.0 lb

## 2015-08-30 DIAGNOSIS — Z0000 Encounter for general adult medical examination without abnormal findings: Secondary | ICD-10-CM

## 2015-08-30 DIAGNOSIS — E11311 Type 2 diabetes mellitus with unspecified diabetic retinopathy with macular edema: Secondary | ICD-10-CM

## 2015-08-30 DIAGNOSIS — E113519 Type 2 diabetes mellitus with proliferative diabetic retinopathy with macular edema, unspecified eye: Secondary | ICD-10-CM

## 2015-08-30 DIAGNOSIS — I2510 Atherosclerotic heart disease of native coronary artery without angina pectoris: Secondary | ICD-10-CM

## 2015-08-30 DIAGNOSIS — I10 Essential (primary) hypertension: Secondary | ICD-10-CM

## 2015-08-30 DIAGNOSIS — Z794 Long term (current) use of insulin: Secondary | ICD-10-CM

## 2015-08-30 DIAGNOSIS — Z89432 Acquired absence of left foot: Secondary | ICD-10-CM | POA: Insufficient documentation

## 2015-08-30 DIAGNOSIS — I160 Hypertensive urgency: Secondary | ICD-10-CM | POA: Insufficient documentation

## 2015-08-30 DIAGNOSIS — E1122 Type 2 diabetes mellitus with diabetic chronic kidney disease: Secondary | ICD-10-CM

## 2015-08-30 DIAGNOSIS — E1142 Type 2 diabetes mellitus with diabetic polyneuropathy: Secondary | ICD-10-CM

## 2015-08-30 DIAGNOSIS — D509 Iron deficiency anemia, unspecified: Secondary | ICD-10-CM

## 2015-08-30 LAB — COMPLETE METABOLIC PANEL WITH GFR
ALBUMIN: 3.4 g/dL — AB (ref 3.6–5.1)
ALK PHOS: 116 U/L — AB (ref 40–115)
ALT: 16 U/L (ref 9–46)
AST: 18 U/L (ref 10–35)
BILIRUBIN TOTAL: 0.4 mg/dL (ref 0.2–1.2)
BUN: 28 mg/dL — AB (ref 7–25)
CO2: 26 mmol/L (ref 20–31)
Calcium: 9 mg/dL (ref 8.6–10.3)
Chloride: 103 mmol/L (ref 98–110)
Creat: 1.79 mg/dL — ABNORMAL HIGH (ref 0.70–1.25)
GFR, Est African American: 45 mL/min — ABNORMAL LOW (ref >=60)
GFR, Est Non African American: 39 mL/min — ABNORMAL LOW (ref >=60)
Glucose, Bld: 157 mg/dL — ABNORMAL HIGH (ref 65–99)
Potassium: 4.9 mmol/L (ref 3.5–5.3)
Sodium: 136 mmol/L (ref 135–146)
TOTAL PROTEIN: 6.9 g/dL (ref 6.1–8.1)

## 2015-08-30 LAB — CBC
HEMATOCRIT: 39.4 % (ref 39.0–52.0)
Hemoglobin: 13.2 g/dL (ref 13.0–17.0)
MCH: 27.5 pg (ref 26.0–34.0)
MCHC: 33.5 g/dL (ref 30.0–36.0)
MCV: 82.1 fL (ref 78.0–100.0)
MPV: 10.5 fL (ref 8.6–12.4)
PLATELETS: 297 10*3/uL (ref 150–400)
RBC: 4.8 MIL/uL (ref 4.22–5.81)
RDW: 14.2 % (ref 11.5–15.5)
WBC: 6.9 10*3/uL (ref 4.0–10.5)

## 2015-08-30 LAB — POCT GLYCOSYLATED HEMOGLOBIN (HGB A1C): Hemoglobin A1C: 7.7

## 2015-08-30 LAB — GLUCOSE, POCT (MANUAL RESULT ENTRY): POC Glucose: 151 mg/dl — AB (ref 70–99)

## 2015-08-30 MED ORDER — ISOSORBIDE MONONITRATE ER 30 MG PO TB24: 30.0000 mg | ORAL_TABLET | Freq: Every day | ORAL | Status: DC

## 2015-08-30 MED ORDER — GABAPENTIN 100 MG PO CAPS: 100.0000 mg | ORAL_CAPSULE | Freq: Three times a day (TID) | ORAL | Status: DC

## 2015-08-30 MED ORDER — HYDRALAZINE HCL 25 MG PO TABS: 50.0000 mg | ORAL_TABLET | Freq: Three times a day (TID) | ORAL | Status: DC

## 2015-08-30 MED ORDER — ASPIRIN EC 81 MG PO TBEC: 81.0000 mg | DELAYED_RELEASE_TABLET | Freq: Every day | ORAL | Status: DC

## 2015-08-30 MED ORDER — CLONIDINE HCL 0.1 MG PO TABS
0.1000 mg | ORAL_TABLET | Freq: Once | ORAL | Status: AC
  Administered 2015-08-30: 0.1 mg via ORAL

## 2015-08-30 MED ORDER — INSULIN GLARGINE 100 UNIT/ML SOLOSTAR PEN: 5.0000 [IU] | PEN_INJECTOR | Freq: Every day | SUBCUTANEOUS | Status: DC

## 2015-08-30 NOTE — Assessment & Plan Note (Signed)
A: diabetes with neuropathy, A1c improving  Med: non compliant P:  D/c lisinopril pending CMP lantus 5 U daily

## 2015-08-30 NOTE — Progress Notes (Signed)
F/U leg pain and knee DM  Pain scale #8

## 2015-08-30 NOTE — Assessment & Plan Note (Signed)
A: HTN urgency in setting of medication non-compliance P:  Restart hydralazine, imdur Advised patient to monitor BP at home CMP today

## 2015-08-30 NOTE — Progress Notes (Signed)
Subjective:  Patient ID: Joshua [name], male    DOB: 01/05/1951  Age: 65 y.o. MRN: [id number]  CC: Leg Pain; Hypertension; Diabetes; and Flu Vaccine   HPI Joshua [name] presents for   1. Foot pain: x 2 months. Feels like he is walking on a corrugated floor. No recent injury. S/p L transmetarsal amputation. No ulcers.   2. CHRONIC HYPERTENSION  Disease Monitoring  Blood pressure range: not checking   Chest pain: yes, when lying flat    Dyspnea: no   Claudication: no   Medication compliance: no. He has ran out. He now lives with is son in [place]. He reports HA. No HA now. No CP or SOB.  Medication Side Effects  Lightheadedness: no   Urinary frequency: no   Edema: no    3. CHRONIC DIABETES  Disease Monitoring  Blood Sugar Ranges: not checking   Polyuria: yes   Visual problems: yes blurry and double distant vision   Medication Compliance: no   Medication Side Effects  Hypoglycemia: no   Preventitive Health Care  Eye Exam: due   Foot Exam: done today   Social History  Substance Use Topics  . Smoking status: Former Smoker -- 20 years    Types: Cigarettes    Quit date: 11/25/1992  . Smokeless tobacco: Never Used  . Alcohol Use: No     Comment: Rarely.   Outpatient Prescriptions Prior to Visit  Medication Sig Dispense Refill  . acetaminophen (TYLENOL) 325 MG tablet Take 325 mg by mouth every 4 (four) hours as needed for mild pain, moderate pain or fever.     Marland Kitchen aspirin 325 MG EC tablet Take 1 tablet (325 mg total) by mouth daily. (Patient not taking: Reported on 06/29/2015) 30 tablet 0  . ferrous sulfate 325 (65 FE) MG tablet Take 1 tablet (325 mg total) by mouth 3 (three) times daily with meals. (Patient not taking: Reported on 06/29/2015) 90 tablet 1  . glimepiride (AMARYL) 1 MG tablet Take 1 tablet (1 mg total) by mouth daily with breakfast. (Patient not taking: Reported on 07/04/2015) 30 tablet 2  . hydrALAZINE (APRESOLINE) 25 MG tablet Take 2 tablets (50 mg total)  by mouth 3 (three) times daily. 90 tablet 3  . Insulin Glargine (LANTUS SOLOSTAR) 100 UNIT/ML Solostar Pen Inject 5 Units into the skin daily at 10 pm. (Patient not taking: Reported on 06/29/2015) 5 pen PRN  . Insulin Pen Needle (ULTICARE MICRO PEN NEEDLES) 32G X 4 MM MISC 1 each by Does not apply route daily. 100 each 3  . isosorbide mononitrate (IMDUR) 30 MG 24 hr tablet Take 1 tablet (30 mg total) by mouth daily. 30 tablet 3  . lisinopril (PRINIVIL,ZESTRIL) 20 MG tablet Take 1 tablet (20 mg total) by mouth daily. (Patient not taking: Reported on 07/04/2015) 30 tablet 5  . OVER THE COUNTER MEDICATION Take 2 sprays by mouth daily. Hemp oil     Facility-Administered Medications Prior to Visit  Medication Dose Route Frequency Provider Last Rate Last Dose  . cloNIDine (CATAPRES) tablet 0.2 mg  0.2 mg Oral Once Ebonie Westerlund, MD        ROS Review of Systems  Constitutional: Negative for fever, chills, fatigue and unexpected weight change.  Eyes: Positive for visual disturbance (chronic blurry vision ).  Respiratory: Negative for cough and shortness of breath.   Cardiovascular: Positive for chest pain (when lying flat ). Negative for palpitations and leg swelling.  Gastrointestinal: Negative for nausea, vomiting, abdominal  pain, diarrhea, constipation and blood in stool.  Endocrine: Negative for polydipsia, polyphagia and polyuria.  Musculoskeletal: Positive for myalgias and arthralgias. Negative for back pain, gait problem and neck pain.       Pains in feet   Skin: Negative for rash.  Allergic/Immunologic: Negative for immunocompromised state.  Neurological: Positive for weakness and headaches.  Hematological: Negative for adenopathy. Does not bruise/bleed easily.  Psychiatric/Behavioral: Negative for suicidal ideas, sleep disturbance and dysphoric mood. The patient is not nervous/anxious.     Objective:  BP 197/92 mmHg  Pulse 68  Temp(Src) 97 F (36.1 C) (Oral)  Resp 16  Ht 5\' 5"   (1.651 m)  Wt 148 lb (67.132 kg)  BMI 24.63 kg/m2  SpO2 97%  BP/Weight 08/30/2015 [id number]  Systolic BP [id number] [id number]  Diastolic BP 92 89 87  Wt. (Lbs) 148 - -  BMI 24.63 - -   Wt Readings from Last 3 Encounters:  08/30/15 148 lb (67.132 kg)  06/14/15 151 lb (68.493 kg)  05/03/15 151 lb 6.4 oz (68.675 kg)    Physical Exam  Constitutional: He appears well-developed and well-nourished. No distress.  HENT:  Head: Normocephalic and atraumatic.  Eyes: EOM are normal.  Neck: Normal range of motion. Neck supple.  Cardiovascular: Normal rate, regular rhythm, normal heart sounds and intact distal pulses.   Pulmonary/Chest: Effort normal and breath sounds normal.  Musculoskeletal: He exhibits no edema or tenderness.  L transmetatarsal amputation   Neurological: He is alert.  Skin: Skin is warm and dry. No rash noted. No erythema.  Psychiatric: He has a normal mood and affect.   Lab Results  Component Value Date   HGBA1C 10.4 05/03/2015   Lab Results  Component Value Date   HGBA1C 7.70 08/30/2015    CBG 151  Treated with 0.1 clonidine Repeat BP was 197/100 Assessment & Plan:   Problem List Items Addressed This Visit    Diabetes mellitus with renal manifestation ([place]) - Primary (Chronic)    A: diabetes with neuropathy, A1c improving  Med: non compliant P:  D/c lisinopril pending CMP lantus 5 U daily       Relevant Medications   Insulin Glargine (LANTUS SOLOSTAR) 100 UNIT/ML Solostar Pen   aspirin EC 81 MG tablet   Other Relevant Orders   Glucose (CBG) (Completed)   HgB A1c (Completed)   Ambulatory referral to Ophthalmology   Diabetic macular edema (HCC) (Chronic)   Relevant Medications   Insulin Glargine (LANTUS SOLOSTAR) 100 UNIT/ML Solostar Pen   aspirin EC 81 MG tablet   Other Relevant Orders   Ambulatory referral to Ophthalmology   Diabetic neuropathy (Chronic)   Relevant Medications   Insulin Glargine (LANTUS SOLOSTAR) 100 UNIT/ML Solostar Pen    gabapentin (NEURONTIN) 100 MG capsule   aspirin EC 81 MG tablet   Diabetic proliferative retinopathy (HCC) (Chronic)   Relevant Medications   Insulin Glargine (LANTUS SOLOSTAR) 100 UNIT/ML Solostar Pen   aspirin EC 81 MG tablet   Other Relevant Orders   Ambulatory referral to Ophthalmology   Hypertension (Chronic)    A: HTN with CKD Med: non compliant P: d.c lisinopril Refilled hydralazine and imdur, 90 day supply       Relevant Medications   hydrALAZINE (APRESOLINE) 25 MG tablet   isosorbide mononitrate (IMDUR) 30 MG 24 hr tablet   aspirin EC 81 MG tablet   cloNIDine (CATAPRES) tablet 0.1 mg (Completed)   Other Relevant Orders   COMPLETE METABOLIC PANEL WITH GFR   Hypertensive  urgency    A: HTN urgency in setting of medication non-compliance P:  Restart hydralazine, imdur Advised patient to monitor BP at home CMP today        Relevant Medications   hydrALAZINE (APRESOLINE) 25 MG tablet   isosorbide mononitrate (IMDUR) 30 MG 24 hr tablet   aspirin EC 81 MG tablet   cloNIDine (CATAPRES) tablet 0.1 mg (Completed)   Microcytic anemia (Chronic)   Relevant Orders   CBC   Mild CAD (Chronic)   Relevant Medications   hydrALAZINE (APRESOLINE) 25 MG tablet   isosorbide mononitrate (IMDUR) 30 MG 24 hr tablet   aspirin EC 81 MG tablet   cloNIDine (CATAPRES) tablet 0.1 mg (Completed)   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    Follow-up: No Follow-up on file.   [name]

## 2015-08-30 NOTE — Patient Instructions (Addendum)
Joshua [name] was seen today for leg pain, hypertension, diabetes and flu vaccine.  Diagnoses and all orders for this visit:  Type 2 diabetes mellitus with chronic kidney disease, with long-term current use of insulin, unspecified CKD stage (HCC) -     Glucose (CBG) -     HgB A1c -     Ambulatory referral to Ophthalmology -     Insulin Glargine (LANTUS SOLOSTAR) 100 UNIT/ML Solostar Pen; Inject 5 Units into the skin daily at 10 pm.  Essential hypertension -     COMPLETE METABOLIC PANEL WITH GFR -     hydrALAZINE (APRESOLINE) 25 MG tablet; Take 2 tablets (50 mg total) by mouth 3 (three) times daily. -     isosorbide mononitrate (IMDUR) 30 MG 24 hr tablet; Take 1 tablet (30 mg total) by mouth daily.  Status post transmetatarsal amputation of left foot (HCC)  Diabetic macular edema (HCC) -     Ambulatory referral to Ophthalmology  Proliferative diabetic retinopathy with macular edema associated with type 2 diabetes mellitus ([place]) -     Ambulatory referral to Ophthalmology  Diabetic neuropathy -     gabapentin (NEURONTIN) 100 MG capsule; Take 1 capsule (100 mg total) by mouth 3 (three) times daily.  Microcytic anemia -     CBC  Mild CAD -     aspirin EC 81 MG tablet; Take 1 tablet (81 mg total) by mouth daily.   Please purchase a BP cuff at home to help monitor blood pressure goal is < 140/90 at all times  F/u in 3 months with me for HTN  Dr. Adrian Blackwater

## 2015-08-30 NOTE — Assessment & Plan Note (Signed)
A: HTN with CKD Med: non compliant P: d.c lisinopril Refilled hydralazine and imdur, 90 day supply

## 2015-10-06 ENCOUNTER — Telehealth: Payer: Self-pay | Admitting: Family Medicine

## 2015-10-06 NOTE — Telephone Encounter (Signed)
Patient can not afford his medication at [facility] and is hoping he can get them sent to [place] at [place]. [place], [place]. Patient already tried calling [facility] to get them transferred but the pharmacy told him that he would have to call us. Please follow up with patient. Thank you.

## 2015-12-05 ENCOUNTER — Ambulatory Visit: Payer: Self-pay | Attending: Family Medicine | Admitting: Family Medicine

## 2015-12-05 ENCOUNTER — Encounter: Payer: Self-pay | Admitting: Family Medicine

## 2015-12-05 VITALS — BP 140/72 | HR 80 | Temp 98.4°F | Resp 16 | Ht 65.0 in | Wt 156.0 lb

## 2015-12-05 DIAGNOSIS — R400 Somnolence: Secondary | ICD-10-CM

## 2015-12-05 DIAGNOSIS — Z7982 Long term (current) use of aspirin: Secondary | ICD-10-CM | POA: Insufficient documentation

## 2015-12-05 DIAGNOSIS — I10 Essential (primary) hypertension: Secondary | ICD-10-CM

## 2015-12-05 DIAGNOSIS — I2510 Atherosclerotic heart disease of native coronary artery without angina pectoris: Secondary | ICD-10-CM

## 2015-12-05 DIAGNOSIS — N183 Chronic kidney disease, stage 3 unspecified: Secondary | ICD-10-CM

## 2015-12-05 DIAGNOSIS — G4710 Hypersomnia, unspecified: Secondary | ICD-10-CM | POA: Insufficient documentation

## 2015-12-05 DIAGNOSIS — N400 Enlarged prostate without lower urinary tract symptoms: Secondary | ICD-10-CM

## 2015-12-05 DIAGNOSIS — N401 Enlarged prostate with lower urinary tract symptoms: Secondary | ICD-10-CM | POA: Insufficient documentation

## 2015-12-05 DIAGNOSIS — J310 Chronic rhinitis: Secondary | ICD-10-CM | POA: Insufficient documentation

## 2015-12-05 DIAGNOSIS — Z794 Long term (current) use of insulin: Secondary | ICD-10-CM | POA: Insufficient documentation

## 2015-12-05 DIAGNOSIS — Z79899 Other long term (current) drug therapy: Secondary | ICD-10-CM | POA: Insufficient documentation

## 2015-12-05 DIAGNOSIS — L84 Corns and callosities: Secondary | ICD-10-CM

## 2015-12-05 DIAGNOSIS — K0889 Other specified disorders of teeth and supporting structures: Secondary | ICD-10-CM

## 2015-12-05 DIAGNOSIS — E1122 Type 2 diabetes mellitus with diabetic chronic kidney disease: Secondary | ICD-10-CM

## 2015-12-05 DIAGNOSIS — Z87891 Personal history of nicotine dependence: Secondary | ICD-10-CM | POA: Insufficient documentation

## 2015-12-05 DIAGNOSIS — R350 Frequency of micturition: Secondary | ICD-10-CM | POA: Insufficient documentation

## 2015-12-05 DIAGNOSIS — B353 Tinea pedis: Secondary | ICD-10-CM

## 2015-12-05 DIAGNOSIS — I129 Hypertensive chronic kidney disease with stage 1 through stage 4 chronic kidney disease, or unspecified chronic kidney disease: Secondary | ICD-10-CM | POA: Insufficient documentation

## 2015-12-05 DIAGNOSIS — E11311 Type 2 diabetes mellitus with unspecified diabetic retinopathy with macular edema: Secondary | ICD-10-CM

## 2015-12-05 DIAGNOSIS — H109 Unspecified conjunctivitis: Secondary | ICD-10-CM | POA: Insufficient documentation

## 2015-12-05 LAB — HEMOCCULT GUIAC POC 1CARD (OFFICE): FECAL OCCULT BLD: NEGATIVE

## 2015-12-05 LAB — COMPLETE METABOLIC PANEL WITH GFR
ALBUMIN: 3.3 g/dL — AB (ref 3.6–5.1)
ALK PHOS: 119 U/L — AB (ref 40–115)
ALT: 14 U/L (ref 9–46)
AST: 15 U/L (ref 10–35)
BILIRUBIN TOTAL: 0.4 mg/dL (ref 0.2–1.2)
BUN: 25 mg/dL (ref 7–25)
CALCIUM: 8.8 mg/dL (ref 8.6–10.3)
CO2: 25 mmol/L (ref 20–31)
CREATININE: 1.76 mg/dL — AB (ref 0.70–1.25)
Chloride: 103 mmol/L (ref 98–110)
GFR, EST AFRICAN AMERICAN: 46 mL/min — AB (ref >=60)
GFR, EST NON AFRICAN AMERICAN: 40 mL/min — AB (ref >=60)
Glucose, Bld: 221 mg/dL — ABNORMAL HIGH (ref 65–99)
Potassium: 4.3 mmol/L (ref 3.5–5.3)
Sodium: 136 mmol/L (ref 135–146)
TOTAL PROTEIN: 6.5 g/dL (ref 6.1–8.1)

## 2015-12-05 LAB — POCT GLYCOSYLATED HEMOGLOBIN (HGB A1C): Hemoglobin A1C: 6.9

## 2015-12-05 LAB — GLUCOSE, POCT (MANUAL RESULT ENTRY): POC GLUCOSE: 229 mg/dL — AB (ref 70–99)

## 2015-12-05 MED ORDER — FLUTICASONE PROPIONATE 50 MCG/ACT NA SUSP: 2.0000 | Freq: Every day | NASAL | Status: DC

## 2015-12-05 MED ORDER — ISOSORBIDE MONONITRATE ER 30 MG PO TB24: 30.0000 mg | ORAL_TABLET | Freq: Every day | ORAL | Status: DC

## 2015-12-05 MED ORDER — TAMSULOSIN HCL 0.4 MG PO CAPS: 0.4000 mg | ORAL_CAPSULE | Freq: Every day | ORAL | Status: DC

## 2015-12-05 MED ORDER — TERBINAFINE HCL 1 % EX CREA: 1.0000 "application " | TOPICAL_CREAM | Freq: Two times a day (BID) | CUTANEOUS | Status: DC

## 2015-12-05 MED ORDER — ASPIRIN EC 81 MG PO TBEC: 81.0000 mg | DELAYED_RELEASE_TABLET | Freq: Every day | ORAL | Status: DC

## 2015-12-05 MED ORDER — HYDRALAZINE HCL 25 MG PO TABS: 50.0000 mg | ORAL_TABLET | Freq: Three times a day (TID) | ORAL | Status: DC

## 2015-12-05 MED FILL — ?TAMSULOSIN HCL 0.4 MG CAP: 0.4 | 30 days supply | Qty: 30 | Fill #0

## 2015-12-05 MED FILL — ISOSORBIDE MN ER 30 MG TAB: 30 | 30 days supply | Qty: 30 | Fill #0

## 2015-12-05 MED FILL — hydrALAZINE HCL 25 MG TABS: 25 | 30 days supply | Qty: 180 | Fill #0

## 2015-12-05 MED FILL — FLUTICASONE PROP 50 MCG SPR: 50 | 16 days supply | Qty: 16 | Fill #0

## 2015-12-05 NOTE — Progress Notes (Signed)
Patient ID: Joshua [name], male   DOB: Dec 04, 1950, 65 y.o.   MRN: [id number]   Subjective:  Patient ID: Joshua [name], male    DOB: [birth year]  Age: 65 y.o. MRN: [id number]  CC: Diabetes and Hypertension  HPI Joshua [name] presents with his son for the following:    1. CHRONIC DIABETES  Disease Monitoring  Blood Sugar Ranges: not checking   Polyuria: yes   Visual problems: yes   Medication Compliance: yes  Medication Side Effects  Hypoglycemia: no   Preventitive Health Care  Eye Exam: due   Foot Exam: done today   Diet pattern: no restrictions   Exercise: none   2. CHRONIC HYPERTENSION  Disease Monitoring  Blood pressure range: not checking   Chest pain: yes, with exertion, like walking up stairs    Dyspnea: no   Claudication: no   Medication compliance: yes, but he did not take medications today  Medication Side Effects  Lightheadedness: no   Urinary frequency: yes   Edema: yes    2. Daytime somnolence: tired during the day. He snores and does not sleep well at night. This has been an ongoing problem for many months. His son is concerned that he falls asleep easily and does not exercise.   3. R great toe callus: patient has chronic callus. He has tried to self trim the callus with a razor blade a few weeks ago. He cut himself. The cut has healed. He is s/p L metatarsal amputation. He is diabetic.   4. Urinary frequency: he urinates every hour. No dysuria. Urine stream is reported as strong. No fever, chills or flank pain.   Social History  Substance Use Topics  . Smoking status: Former Smoker -- 20 years    Types: Cigarettes    Quit date: 11/25/1992  . Smokeless tobacco: Never Used  . Alcohol Use: No     Comment: Rarely.   Outpatient Prescriptions Prior to Visit  Medication Sig Dispense Refill  . gabapentin (NEURONTIN) 100 MG capsule Take 1 capsule (100 mg total) by mouth 3 (three) times daily. 270 capsule 1  . hydrALAZINE (APRESOLINE) 25 MG tablet  Take 2 tablets (50 mg total) by mouth 3 (three) times daily. 270 tablet 3  . Insulin Glargine (LANTUS SOLOSTAR) 100 UNIT/ML Solostar Pen Inject 5 Units into the skin daily at 10 pm. 5 pen PRN  . Insulin Pen Needle (ULTICARE MICRO PEN NEEDLES) 32G X 4 MM MISC 1 each by Does not apply route daily. 100 each 3  . isosorbide mononitrate (IMDUR) 30 MG 24 hr tablet Take 1 tablet (30 mg total) by mouth daily. 90 tablet 3  . acetaminophen (TYLENOL) 325 MG tablet Take 325 mg by mouth every 4 (four) hours as needed for mild pain, moderate pain or fever. Reported on 12/05/2015    . aspirin EC 81 MG tablet Take 1 tablet (81 mg total) by mouth daily. (Patient not taking: Reported on 12/05/2015) 90 tablet 3  . OVER THE COUNTER MEDICATION Take 2 sprays by mouth daily. Reported on 12/05/2015     No facility-administered medications prior to visit.    ROS Review of Systems  Constitutional: Negative for fever, chills, fatigue and unexpected weight change.  HENT: Positive for rhinorrhea.   Eyes: Positive for discharge and visual disturbance.  Respiratory: Negative for cough and shortness of breath.   Cardiovascular: Positive for chest pain (when walking up stairs sometimes ). Negative for palpitations and leg swelling.  Gastrointestinal: Negative for  nausea, vomiting, abdominal pain, diarrhea, constipation and blood in stool.  Endocrine: Negative for polydipsia, polyphagia and polyuria.  Genitourinary: Positive for frequency. Negative for dysuria, flank pain and difficulty urinating.  Musculoskeletal: Negative for myalgias, back pain, arthralgias, gait problem and neck pain.  Skin: Positive for rash.  Allergic/Immunologic: Negative for immunocompromised state.  Hematological: Negative for adenopathy. Does not bruise/bleed easily.  Psychiatric/Behavioral: Negative for suicidal ideas, sleep disturbance and dysphoric mood. The patient is not nervous/anxious.     Objective:  BP 155/77 mmHg  Pulse 80  Temp(Src)  98.4 F (36.9 C) (Oral)  Resp 16  Ht 5\' 5"  (1.651 m)  Wt 156 lb (70.761 kg)  BMI 25.96 kg/m2  SpO2 98%  BP/Weight 12/05/2015 A999333 [id number]  Systolic BP [id number] [id number] 123456  Diastolic BP 77 123XX123 89  Wt. (Lbs) 156 148 -  BMI 25.96 24.63 -   Physical Exam  Constitutional: He appears well-developed and well-nourished. No distress.  HENT:  Head: Normocephalic and atraumatic.  Neck: Normal range of motion. Neck supple.  Cardiovascular: Normal rate, regular rhythm, normal heart sounds and intact distal pulses.   Pulmonary/Chest: Effort normal and breath sounds normal.  Genitourinary: Rectum normal. Guaiac negative stool. Prostate is enlarged. Prostate is not tender.  Musculoskeletal: He exhibits no edema.       Feet:  Neurological: He is alert.  Skin: Skin is warm and dry. No rash noted. No erythema.     Psychiatric: He has a normal mood and affect.   Lab Results  Component Value Date   HGBA1C 6.90 12/05/2015   CBG 229  Heme negative stools  EKG: normal EKG, normal sinus rhythm, nonspecific ST and T waves changes.  Assessment & Plan:   Problem List Items Addressed This Visit    BPH (benign prostatic hyperplasia) (Chronic)    A: prostate slightly enlarged with urinary frequency P: flomax 0.4 mg daily       Relevant Medications   tamsulosin (FLOMAX) 0.4 MG CAPS capsule   Other Relevant Orders   Hemoccult - 1 Card (office) (Completed)   CKD (chronic kidney disease) stage 3, GFR 30-59 ml/min   Relevant Orders   COMPLETE METABOLIC PANEL WITH GFR   Daytime somnolence (Chronic)    Daytime somnolence suspect OSA  Sleep study ordered       Relevant Orders   Split night study   Diabetes mellitus with renal manifestation (HCC) - Primary (Chronic)   Relevant Medications   lisinopril (PRINIVIL,ZESTRIL) 20 MG tablet   glimepiride (AMARYL) 2 MG tablet   aspirin EC 81 MG tablet   Other Relevant Orders   POCT glycosylated hemoglobin (Hb A1C) (Completed)   POCT glucose  (manual entry) (Completed)   COMPLETE METABOLIC PANEL WITH GFR   Ambulatory referral to Ophthalmology   Diabetic macular edema (HCC) (Chronic)   Relevant Medications   lisinopril (PRINIVIL,ZESTRIL) 20 MG tablet   glimepiride (AMARYL) 2 MG tablet   aspirin EC 81 MG tablet   Other Relevant Orders   Ambulatory referral to Ophthalmology   Hypertension (Chronic)    A: HTN with CKD,  BP slightly above goal P: CMP Continue lisinopril 20 mg daily  Hydralazine 25 mg TID Imdur 30 mg daily        Relevant Medications   lisinopril (PRINIVIL,ZESTRIL) 20 MG tablet   hydrALAZINE (APRESOLINE) 25 MG tablet   isosorbide mononitrate (IMDUR) 30 MG 24 hr tablet   aspirin EC 81 MG tablet   Other Relevant Orders   COMPLETE METABOLIC PANEL  WITH GFR   Mild CAD (Chronic)    A: patient has known CAD with mild CP when walking upstairs. He is very sedentary at baseline P: Restart oral aspirin Control BP, recheck was near goal for age Continue to control CBGs Card referral       Relevant Medications   lisinopril (PRINIVIL,ZESTRIL) 20 MG tablet   hydrALAZINE (APRESOLINE) 25 MG tablet   isosorbide mononitrate (IMDUR) 30 MG 24 hr tablet   aspirin EC 81 MG tablet   Other Relevant Orders   EKG 12-Lead (Completed)   Ambulatory referral to Cardiology   Rhinoconjunctivitis (Chronic)   Relevant Medications   fluticasone (FLONASE) 50 MCG/ACT nasal spray    Other Visit Diagnoses    Pain of molar        Relevant Orders    Ambulatory referral to Dentistry    Foot callus        Relevant Orders    Ambulatory referral to Podiatry    Tinea pedis of right foot        Relevant Medications    terbinafine (LAMISIL AT) 1 % cream       No orders of the defined types were placed in this encounter.    Follow-up: No Follow-up on file.   [name]

## 2015-12-05 NOTE — Assessment & Plan Note (Signed)
A: prostate slightly enlarged with urinary frequency P: flomax 0.4 mg daily

## 2015-12-05 NOTE — Assessment & Plan Note (Signed)
Daytime somnolence suspect OSA  Sleep study ordered

## 2015-12-05 NOTE — Assessment & Plan Note (Signed)
A: HTN with CKD,  BP slightly above goal P: CMP Continue lisinopril 20 mg daily  Hydralazine 25 mg TID Imdur 30 mg daily

## 2015-12-05 NOTE — Assessment & Plan Note (Signed)
A: patient has known CAD with mild CP when walking upstairs. He is very sedentary at baseline P: Restart oral aspirin Control BP, recheck was near goal for age Continue to control CBGs Card referral

## 2015-12-05 NOTE — Patient Instructions (Addendum)
Siddh was seen today for diabetes.  Diagnoses and all orders for this visit:  Type 2 diabetes mellitus with chronic kidney disease, with long-term current use of insulin, unspecified CKD stage (HCC) -     POCT glycosylated hemoglobin (Hb A1C) -     POCT glucose (manual entry) -     COMPLETE METABOLIC PANEL WITH GFR -     Ambulatory referral to Ophthalmology  Essential hypertension -     hydrALAZINE (APRESOLINE) 25 MG tablet; Take 2 tablets (50 mg total) by mouth 3 (three) times daily. -     isosorbide mononitrate (IMDUR) 30 MG 24 hr tablet; Take 1 tablet (30 mg total) by mouth daily. -     COMPLETE METABOLIC PANEL WITH GFR  CKD (chronic kidney disease) stage 3, GFR 30-59 ml/min -     COMPLETE METABOLIC PANEL WITH GFR  Diabetic macular edema (HCC) -     Ambulatory referral to Ophthalmology  Mild CAD -     aspirin EC 81 MG tablet; Take 1 tablet (81 mg total) by mouth daily. -     EKG 12-Lead; Standing -     EKG 12-Lead -     Ambulatory referral to Cardiology  Daytime somnolence -     Split night study; Future  Pain of molar -     Ambulatory referral to Dentistry  Foot callus -     Ambulatory referral to Podiatry  Tinea pedis of right foot -     terbinafine (LAMISIL AT) 1 % cream; Apply 1 application topically 2 (two) times daily.  BPH (benign prostatic hyperplasia) -     tamsulosin (FLOMAX) 0.4 MG CAPS capsule; Take 1 capsule (0.4 mg total) by mouth daily.  Rhinoconjunctivitis -     fluticasone (FLONASE) 50 MCG/ACT nasal spray; Place 2 sprays into both nostrils daily.  Other orders -     EKG 12-Lead   Restart daily aspirin lasmil is new cream for feet flonase for runny nose and eyes  Cardiology referral to evaluate heart given chest pain when walking up stairs  F/u in 4 weeks for HTN F/u with 3 months with me for HTN and diabetes  Dr. Adrian Blackwater

## 2015-12-05 NOTE — Progress Notes (Signed)
F/U DM, ankles swelling and itching  No taking glucose level at home  Taking medication as prescribed  No pain today No tobacco user  No suicidal though in the past two weeks

## 2015-12-12 ENCOUNTER — Telehealth: Payer: Self-pay

## 2015-12-12 NOTE — Telephone Encounter (Signed)
-----   Message from Boykin Nearing, MD sent at 12/07/2015  5:41 PM EST ----- Stable CMP

## 2015-12-12 NOTE — Telephone Encounter (Signed)
Tried to call patient this am  Patient not available Message left on voice mail to return our call

## 2015-12-18 ENCOUNTER — Other Ambulatory Visit: Payer: Self-pay | Admitting: *Deleted

## 2015-12-18 DIAGNOSIS — E1122 Type 2 diabetes mellitus with diabetic chronic kidney disease: Secondary | ICD-10-CM

## 2015-12-18 DIAGNOSIS — Z794 Long term (current) use of insulin: Principal | ICD-10-CM

## 2015-12-18 MED ORDER — INSULIN GLARGINE 100 UNIT/ML SOLOSTAR PEN: 5.0000 [IU] | PEN_INJECTOR | Freq: Every day | SUBCUTANEOUS | Status: DC

## 2015-12-27 ENCOUNTER — Ambulatory Visit: Payer: Self-pay | Admitting: Cardiovascular Disease

## 2016-01-02 ENCOUNTER — Telehealth: Payer: Self-pay | Admitting: Family Medicine

## 2016-01-02 ENCOUNTER — Encounter: Payer: Self-pay | Admitting: Family Medicine

## 2016-01-02 ENCOUNTER — Ambulatory Visit: Payer: Self-pay

## 2016-01-02 ENCOUNTER — Ambulatory Visit: Payer: Self-pay | Attending: Family Medicine | Admitting: Family Medicine

## 2016-01-02 VITALS — BP 150/82 | HR 87 | Temp 98.3°F | Resp 16 | Ht 65.0 in | Wt 155.0 lb

## 2016-01-02 DIAGNOSIS — Z79899 Other long term (current) drug therapy: Secondary | ICD-10-CM | POA: Insufficient documentation

## 2016-01-02 DIAGNOSIS — R350 Frequency of micturition: Secondary | ICD-10-CM | POA: Insufficient documentation

## 2016-01-02 DIAGNOSIS — E119 Type 2 diabetes mellitus without complications: Secondary | ICD-10-CM | POA: Insufficient documentation

## 2016-01-02 DIAGNOSIS — T7840XA Allergy, unspecified, initial encounter: Secondary | ICD-10-CM | POA: Insufficient documentation

## 2016-01-02 DIAGNOSIS — Z87891 Personal history of nicotine dependence: Secondary | ICD-10-CM | POA: Insufficient documentation

## 2016-01-02 DIAGNOSIS — N189 Chronic kidney disease, unspecified: Secondary | ICD-10-CM | POA: Insufficient documentation

## 2016-01-02 DIAGNOSIS — R05 Cough: Secondary | ICD-10-CM | POA: Insufficient documentation

## 2016-01-02 DIAGNOSIS — I10 Essential (primary) hypertension: Secondary | ICD-10-CM | POA: Insufficient documentation

## 2016-01-02 DIAGNOSIS — D649 Anemia, unspecified: Secondary | ICD-10-CM

## 2016-01-02 DIAGNOSIS — I129 Hypertensive chronic kidney disease with stage 1 through stage 4 chronic kidney disease, or unspecified chronic kidney disease: Secondary | ICD-10-CM | POA: Insufficient documentation

## 2016-01-02 DIAGNOSIS — E1122 Type 2 diabetes mellitus with diabetic chronic kidney disease: Secondary | ICD-10-CM

## 2016-01-02 DIAGNOSIS — J309 Allergic rhinitis, unspecified: Secondary | ICD-10-CM

## 2016-01-02 DIAGNOSIS — R079 Chest pain, unspecified: Secondary | ICD-10-CM | POA: Insufficient documentation

## 2016-01-02 DIAGNOSIS — R972 Elevated prostate specific antigen [PSA]: Secondary | ICD-10-CM

## 2016-01-02 DIAGNOSIS — Z794 Long term (current) use of insulin: Secondary | ICD-10-CM

## 2016-01-02 DIAGNOSIS — N400 Enlarged prostate without lower urinary tract symptoms: Secondary | ICD-10-CM | POA: Insufficient documentation

## 2016-01-02 DIAGNOSIS — Z0000 Encounter for general adult medical examination without abnormal findings: Secondary | ICD-10-CM

## 2016-01-02 DIAGNOSIS — Z7982 Long term (current) use of aspirin: Secondary | ICD-10-CM | POA: Insufficient documentation

## 2016-01-02 LAB — CBC
HCT: 32.7 % — ABNORMAL LOW (ref 39.0–52.0)
Hemoglobin: 10.9 g/dL — ABNORMAL LOW (ref 13.0–17.0)
MCH: 27.5 pg (ref 26.0–34.0)
MCHC: 33.3 g/dL (ref 30.0–36.0)
MCV: 82.4 fL (ref 78.0–100.0)
MPV: 9.7 fL (ref 8.6–12.4)
PLATELETS: 388 10*3/uL (ref 150–400)
RBC: 3.97 MIL/uL — ABNORMAL LOW (ref 4.22–5.81)
RDW: 14 % (ref 11.5–15.5)
WBC: 9.7 10*3/uL (ref 4.0–10.5)

## 2016-01-02 LAB — GLUCOSE, POCT (MANUAL RESULT ENTRY): POC Glucose: 221 mg/dl — AB (ref 70–99)

## 2016-01-02 MED ORDER — INSULIN GLARGINE 100 UNIT/ML SOLOSTAR PEN: 5.0000 [IU] | PEN_INJECTOR | Freq: Every day | SUBCUTANEOUS | Status: DC

## 2016-01-02 MED ORDER — CETIRIZINE HCL 10 MG PO TABS: 10.0000 mg | ORAL_TABLET | Freq: Every day | ORAL | Status: DC

## 2016-01-02 MED ORDER — HYDRALAZINE HCL 50 MG PO TABS: 50.0000 mg | ORAL_TABLET | Freq: Three times a day (TID) | ORAL | Status: DC

## 2016-01-02 MED ORDER — TAMSULOSIN HCL 0.4 MG PO CAPS: 0.8000 mg | ORAL_CAPSULE | Freq: Every day | ORAL | Status: DC

## 2016-01-02 MED ORDER — LOSARTAN POTASSIUM 25 MG PO TABS: 25.0000 mg | ORAL_TABLET | Freq: Every day | ORAL | Status: DC

## 2016-01-02 MED FILL — ?CETIRIZINE HCL 10 MG TABLE: 10 | 30 days supply | Qty: 30 | Fill #0

## 2016-01-02 MED FILL — TAMSULOSIN HCL 0.4 MG CAP: 0.4 | 30 days supply | Qty: 60 | Fill #0

## 2016-01-02 MED FILL — $LANTUS SOLOSTAR 100 UNITS/: 100 | 60 days supply | Qty: 3 | Fill #2

## 2016-01-02 MED FILL — hydrALAZINE HCL 50 MG TABS: 50 | 30 days supply | Qty: 90 | Fill #0

## 2016-01-02 MED FILL — LOSARTAN POTASSIUM 25 MG TA: 25 | 30 days supply | Qty: 30 | Fill #0

## 2016-01-02 NOTE — Assessment & Plan Note (Signed)
Patient reports slight improvement in frequency   Increase flomax to 0.8 mg daily

## 2016-01-02 NOTE — Progress Notes (Signed)
Subjective:  Patient ID: Joshua [name], male    DOB: 28-Aug-1951  Age: 65 y.o. MRN: [id number]  CC: Hypertension   HPI Joshua [name] presents with his son and his "son's mother" for follow up. He had diabetes, HTN, CKD:   1. CHRONIC HYPERTENSION  Disease Monitoring  Blood pressure range: not checking   Chest pain: yes, when walking up stairs. Has appt with cardiology    Dyspnea: no   Claudication: no   Medication compliance: taking only 25 mg of hydralazine TID, taking imdur, not taking lisinopril  Medication Side Effects  Lightheadedness: no   Urinary frequency: yes  Edema: no    2. Urinary frequency: flomax has helped a little. He still goes frequently and is incontinent at times. This is bothersome to his son. Patient denies dysuria.  3. Cough: dry with runny nose and watery eyes. No fever or chills.  No headaches.   4. Diabetes: out of lantus. Vial is empty. Due for refill.   5. Daytime somnolence; persist. He has been referred for sleep study and it has been scheduled.   Social History  Substance Use Topics  . Smoking status: Former Smoker -- 20 years    Types: Cigarettes    Quit date: 11/25/1992  . Smokeless tobacco: Never Used  . Alcohol Use: No     Comment: Rarely.   Outpatient Prescriptions Prior to Visit  Medication Sig Dispense Refill  . acetaminophen (TYLENOL) 325 MG tablet Take 325 mg by mouth every 4 (four) hours as needed for mild pain, moderate pain or fever. Reported on 12/05/2015    . aspirin EC 81 MG tablet Take 1 tablet (81 mg total) by mouth daily. 90 tablet 3  . hydrALAZINE (APRESOLINE) 25 MG tablet Take 2 tablets (50 mg total) by mouth 3 (three) times daily. 270 tablet 3  . isosorbide mononitrate (IMDUR) 30 MG 24 hr tablet Take 1 tablet (30 mg total) by mouth daily. 90 tablet 3  . tamsulosin (FLOMAX) 0.4 MG CAPS capsule Take 1 capsule (0.4 mg total) by mouth daily. 30 capsule 3  . fluticasone (FLONASE) 50 MCG/ACT nasal spray Place 2 sprays  into both nostrils daily. (Patient not taking: Reported on 01/02/2016) 16 g 2  . gabapentin (NEURONTIN) 100 MG capsule Take 1 capsule (100 mg total) by mouth 3 (three) times daily. (Patient not taking: Reported on 01/02/2016) 270 capsule 1  . glimepiride (AMARYL) 2 MG tablet Take 2 mg by mouth daily with breakfast. Reported on 01/02/2016    . Insulin Glargine (LANTUS SOLOSTAR) 100 UNIT/ML Solostar Pen Inject 5 Units into the skin daily at 10 pm. (Patient not taking: Reported on 01/02/2016) 45 pen 3  . Insulin Pen Needle (ULTICARE MICRO PEN NEEDLES) 32G X 4 MM MISC 1 each by Does not apply route daily. (Patient not taking: Reported on 01/02/2016) 100 each 3  . lisinopril (PRINIVIL,ZESTRIL) 20 MG tablet Take 20 mg by mouth daily. Reported on 01/02/2016    . OVER THE COUNTER MEDICATION Take 2 sprays by mouth daily. Reported on 01/02/2016    . terbinafine (LAMISIL AT) 1 % cream Apply 1 application topically 2 (two) times daily. (Patient not taking: Reported on 01/02/2016) 42 g 1   No facility-administered medications prior to visit.    ROS Review of Systems  Constitutional: Positive for fatigue. Negative for fever, chills and unexpected weight change.  HENT: Positive for rhinorrhea.   Eyes: Positive for discharge and visual disturbance.  Respiratory: Positive for cough. Negative  for shortness of breath.   Cardiovascular: Positive for chest pain (when walking up stairs sometimes ). Negative for palpitations and leg swelling.  Gastrointestinal: Negative for nausea, vomiting, abdominal pain, diarrhea, constipation and blood in stool.  Endocrine: Negative for polydipsia, polyphagia and polyuria.  Genitourinary: Positive for frequency. Negative for dysuria, flank pain and difficulty urinating.  Musculoskeletal: Negative for myalgias, back pain, arthralgias, gait problem and neck pain.  Skin: Positive for rash.  Allergic/Immunologic: Negative for immunocompromised state.  Hematological: Negative for adenopathy. Does  not bruise/bleed easily.  Psychiatric/Behavioral: Negative for suicidal ideas, sleep disturbance and dysphoric mood. The patient is not nervous/anxious.     Objective:  BP 150/82 mmHg  Pulse 87  Temp(Src) 98.3 F (36.8 C) (Oral)  Resp 16  Ht 5\' 5"  (1.651 m)  Wt 155 lb (70.308 kg)  BMI 25.79 kg/m2  SpO2 96%  BP/Weight 01/02/2016 12/05/2015 A999333  Systolic BP [id number]  Diastolic BP 82 72 123XX123  Wt. (Lbs) 155 156 148  BMI 25.79 25.96 24.63   Physical Exam  Constitutional: He appears well-developed and well-nourished. No distress.  HENT:  Head: Normocephalic and atraumatic.  Neck: Normal range of motion. Neck supple.  Cardiovascular: Normal rate, regular rhythm, normal heart sounds and intact distal pulses.   Pulmonary/Chest: Effort normal and breath sounds normal.  Musculoskeletal: He exhibits no edema.  Neurological: He is alert.  Skin: Skin is warm and dry. No rash noted. No erythema.  Psychiatric: He has a normal mood and affect.     Chemistry      Component Value Date/Time   NA 136 12/05/2015 1128   K 4.3 12/05/2015 1128   CL 103 12/05/2015 1128   CO2 25 12/05/2015 1128   BUN 25 12/05/2015 1128   CREATININE 1.76* 12/05/2015 1128   CREATININE 1.05 07/19/2014 0651      Component Value Date/Time   CALCIUM 8.8 12/05/2015 1128   ALKPHOS 119* 12/05/2015 1128   AST 15 12/05/2015 1128   ALT 14 12/05/2015 1128   BILITOT 0.4 12/05/2015 1128     Lab Results  Component Value Date   HGBA1C 6.90 12/05/2015     Assessment & Plan:   Joshua [name] was seen today for hypertension.  Diagnoses and all orders for this visit:  Type 2 diabetes mellitus with chronic kidney disease, with long-term current use of insulin, unspecified CKD stage (HCC) -     CBC -     Glucose (CBG) -     Insulin Glargine (LANTUS SOLOSTAR) 100 UNIT/ML Solostar Pen; Inject 5 Units into the skin daily at 10 pm.  BPH (benign prostatic hyperplasia) -     PSA -     tamsulosin (FLOMAX) 0.4 MG CAPS  capsule; Take 2 capsules (0.8 mg total) by mouth daily after supper.  Healthcare maintenance -     Ambulatory referral to Gastroenterology  Essential hypertension -     hydrALAZINE (APRESOLINE) 50 MG tablet; Take 1 tablet (50 mg total) by mouth 3 (three) times daily. -     losartan (COZAAR) 25 MG tablet; Take 1 tablet (25 mg total) by mouth daily.  Allergic rhinitis, unspecified allergic rhinitis type -     cetirizine (ZYRTEC) 10 MG tablet; Take 1 tablet (10 mg total) by mouth daily.    Follow-up: No Follow-up on file.   [name]

## 2016-01-02 NOTE — Assessment & Plan Note (Signed)
This is a chronic problem Zyrtec ordered

## 2016-01-02 NOTE — Telephone Encounter (Signed)
Pt. forgot to give PCP disability parking placard form for the PCP to fill out. The form will be put in the providers box to be filled out.

## 2016-01-02 NOTE — Patient Instructions (Addendum)
Joshua [name] was seen today for hypertension.  Diagnoses and all orders for this visit:  Type 2 diabetes mellitus with chronic kidney disease, with long-term current use of insulin, unspecified CKD stage (HCC) -     CBC -     Glucose (CBG) -     Insulin Glargine (LANTUS SOLOSTAR) 100 UNIT/ML Solostar Pen; Inject 5 Units into the skin daily at 10 pm.  BPH (benign prostatic hyperplasia) -     PSA -     tamsulosin (FLOMAX) 0.4 MG CAPS capsule; Take 2 capsules (0.8 mg total) by mouth daily after supper.  Healthcare maintenance -     Ambulatory referral to Gastroenterology  Essential hypertension -     hydrALAZINE (APRESOLINE) 50 MG tablet; Take 1 tablet (50 mg total) by mouth 3 (three) times daily. -     losartan (COZAAR) 25 MG tablet; Take 1 tablet (25 mg total) by mouth daily.  Allergic rhinitis, unspecified allergic rhinitis type -     cetirizine (ZYRTEC) 10 MG tablet; Take 1 tablet (10 mg total) by mouth daily.   Sleep study scheduled for 02/22/16   F/u in 4 weeks with clinical pharmacologist for BP check F/u with me in 3 months for HTN and diabetes   Dr. Adrian Blackwater

## 2016-01-02 NOTE — Assessment & Plan Note (Signed)
A; BP above goal P: Increase hydralazine to 50 mg TID Continue imdur at 30 mg daily  Losartan 25 mg daily given CKD (patient had not started lisinopril)

## 2016-01-02 NOTE — Progress Notes (Signed)
F/U HTN  Complaining of sleeping to much  Elevated glucose no insulin x 1 month No pain today  No tobacco user  No suicidal thought in the past two weeks  Medicine refills

## 2016-01-03 LAB — PSA: PSA: 7.45 ng/mL — AB (ref <=4.00)

## 2016-01-04 ENCOUNTER — Ambulatory Visit: Payer: Self-pay | Admitting: Cardiovascular Disease

## 2016-01-04 ENCOUNTER — Encounter: Payer: Self-pay | Admitting: Cardiovascular Disease

## 2016-01-04 ENCOUNTER — Ambulatory Visit (INDEPENDENT_AMBULATORY_CARE_PROVIDER_SITE_OTHER): Payer: Self-pay | Admitting: Cardiovascular Disease

## 2016-01-04 VITALS — BP 148/90 | HR 96 | Ht 65.0 in | Wt 144.8 lb

## 2016-01-04 DIAGNOSIS — R972 Elevated prostate specific antigen [PSA]: Secondary | ICD-10-CM | POA: Insufficient documentation

## 2016-01-04 DIAGNOSIS — R0789 Other chest pain: Secondary | ICD-10-CM

## 2016-01-04 DIAGNOSIS — D649 Anemia, unspecified: Secondary | ICD-10-CM | POA: Insufficient documentation

## 2016-01-04 NOTE — Addendum Note (Signed)
Addended by: Boykin Nearing on: 01/04/2016 08:42 AM   Modules accepted: Orders, SmartSet

## 2016-01-04 NOTE — Patient Instructions (Signed)
Medication Instructions:  Your physician recommends that you continue on your current medications as directed. Please refer to the Current Medication list given to you today.   Labwork: None ordered  Testing/Procedures: Your physician has requested that you have a lexiscan myoview. For further information please visit [email or web address]. Please follow instruction sheet, as given.  Your physician has requested that you have an echocardiogram. Echocardiography is a painless test that uses sound waves to create images of your heart. It provides your doctor with information about the size and shape of your heart and how well your heart's chambers and valves are working. This procedure takes approximately one hour. There are no restrictions for this procedure.    Follow-Up: Your physician recommends that you schedule a follow-up appointment in: As needed        If you need a refill on your cardiac medications before your next appointment, please call your pharmacy.

## 2016-01-04 NOTE — Telephone Encounter (Signed)
Disability form ready for pick up Please inform patient

## 2016-01-04 NOTE — Progress Notes (Signed)
Cardiology Office Note   Date:  01/04/2016   ID:  GAVYNN POLE, DOB October 19, 1951, MRN [id number]  PCP:  Minerva Ends, MD  Cardiologist:   Thayer Headings, MD   Chief Complaint  Patient presents with  . Chest Pain   1. Chest pain  2. Diabetes Mellitus  3. TB 4. Essential HTN 5. Sepsis - August , 2015.  Hospitalization .  Associated with cellulitis and sepsis    History of Present Illness: Rondal A Fittro is a 65 y.o. male who presents for  Evaluation of chest pain  Originally from [place].  Has been having some chest pain  Comes and goes.   Worsens when he is climbing stairs.  Not associated with dyspnea or diaphoresis.  Does breath faster when climbing stairs.  No PND or orthopnea   Retired - Geophysical data processor .    Was hospitalized in August , 2015 with sepsis syndrome.  Echo at that time showed normal LV systolic function with some diastolic dysfunction    Past Medical History  Diagnosis Date  . 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 Hypertension 10/14/2012  . Type 2 diabetes mellitus ([place])   . Diabetic nephropathy with proteinuria ([place])   . 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 Diastolic heart failure ([place])   . Cellulitis   . Convulsions ([place])   . Iron deficiency anemia   . 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 Anasarca   . Hypoalbuminemia   . Elevated alkaline phosphatase level   . Microcytic anemia   . GI bleed   . Nephrotic syndrome   . Difficulty in walking(719.7)   . Muscle weakness (generalized)   . Type II or unspecified type diabetes mellitus with renal manifestations, not stated as uncontrolled   . Iron deficiency anemia, unspecified     Past Surgical History  Procedure Laterality Date  . Knee arthroscopy  2010    3 times, right knee  . I&d extremity Left 01/17/2014    Procedure: IRRIGATION AND DEBRIDEMENT  LEFT FOOT;  Surgeon: Wylene Simmer, MD;  Location: [facility];   Service: Orthopedics;  Laterality: Left;  . Transmetatarsal amputation Left 07/14/2014    Procedure: TRANSMETATARSAL AMPUTATION;  Surgeon: Wylene Simmer, MD;  Location: [facility];  Service: Orthopedics;  Laterality: Left;  percutaneous achilles tendon lengthening  . Left heart catheterization with coronary angiogram N/A 02/02/2014    Procedure: LEFT HEART CATHETERIZATION WITH CORONARY ANGIOGRAM;  Surgeon: Burnell Blanks, MD;  Location: [facility] CATH LAB;  Service: Cardiovascular;  Laterality: N/A;     Current Outpatient Prescriptions  Medication Sig Dispense Refill  . acetaminophen (TYLENOL) 325 MG tablet Take 325 mg by mouth every 4 (four) hours as needed for mild pain, moderate pain or fever. Reported on 12/05/2015    . aspirin EC 81 MG tablet Take 1 tablet (81 mg total) by mouth daily. 90 tablet 3  . cetirizine (ZYRTEC) 10 MG tablet Take 1 tablet (10 mg total) by mouth daily. 30 tablet 11  . fluticasone (FLONASE) 50 MCG/ACT nasal spray Place 2 sprays into both nostrils daily. 16 g 2  . gabapentin (NEURONTIN) 100 MG capsule Take 1 capsule (100 mg total) by mouth 3 (three) times daily. 270 capsule 1  . glimepiride (AMARYL) 2 MG tablet Take 2 mg by mouth daily with breakfast. Reported on 01/02/2016    . hydrALAZINE (APRESOLINE) 50 MG tablet Take 1 tablet (50  mg total) by mouth 3 (three) times daily. 90 tablet 5  . Insulin Glargine (LANTUS SOLOSTAR) 100 UNIT/ML Solostar Pen Inject 5 Units into the skin daily at 10 pm. 45 pen 3  . Insulin Pen Needle (ULTICARE MICRO PEN NEEDLES) 32G X 4 MM MISC 1 each by Does not apply route daily. 100 each 3  . isosorbide mononitrate (IMDUR) 30 MG 24 hr tablet Take 1 tablet (30 mg total) by mouth daily. 90 tablet 3  . losartan (COZAAR) 25 MG tablet Take 1 tablet (25 mg total) by mouth daily. 30 tablet 5  . OVER THE COUNTER MEDICATION Take 2 sprays by mouth daily. Reported on 01/02/2016    . tamsulosin (FLOMAX) 0.4 MG CAPS capsule Take 2 capsules (0.8 mg total) by mouth  daily after supper. 60 capsule 3   No current facility-administered medications for this visit.    Allergies:   Review of patient's allergies indicates no known allergies.    Social History:  The patient  reports that he quit smoking about 23 years ago. His smoking use included Cigarettes. He quit after 20 years of use. He has never used smokeless tobacco. He reports that he does not drink alcohol or use illicit drugs.   Family History:  The patient's family history is not on file.    ROS:  Please see the history of present illness.    Review of Systems: Constitutional:  denies fever, chills, diaphoresis, appetite change and fatigue.  HEENT: denies photophobia, eye pain, redness, hearing loss, ear pain, congestion, sore throat, rhinorrhea, sneezing, neck pain, neck stiffness and tinnitus.  Respiratory: admits to  , DOE,    Cardiovascular: admits to chest pain,    Gastrointestinal: denies nausea, vomiting, abdominal pain, diarrhea, constipation, blood in stool.  Genitourinary: denies dysuria, urgency, frequency, hematuria, flank pain and difficulty urinating.  Musculoskeletal: denies  myalgias, back pain, joint swelling, arthralgias and gait problem.   Skin: denies pallor, rash and wound.  Neurological: denies dizziness, seizures, syncope, weakness, light-headedness, numbness and headaches.   Hematological: denies adenopathy, easy bruising, personal or family bleeding history.  Psychiatric/ Behavioral: denies suicidal ideation, mood changes, confusion, nervousness, sleep disturbance and agitation.       All other systems are reviewed and negative.    PHYSICAL EXAM: VS:  BP 148/90 mmHg  Pulse 96  Ht 5\' 5"  (1.651 m)  Wt 144 lb 12.8 oz (65.681 kg)  BMI 24.10 kg/m2 , BMI Body mass index is 24.1 kg/(m^2). GEN: Well nourished, well developed, in no acute distress HEENT: normal Neck: no JVD, carotid bruits, or masses Cardiac: RRR; no murmurs, rubs, ? S3 gallop   Respiratory:   clear to auscultation bilaterally, normal work of breathing GI: soft, nontender, nondistended, + BS MS: no deformity or atrophy Skin: warm and dry, no rash Neuro:  Strength and sensation are intact Psych: normal   EKG:  EKG is not ordered today. The ekg ordered 12/05/15  demonstrates NSR at 76.  TWI in the lateral leads   Recent Labs: 12/05/2015: ALT 14; BUN 25; Creat 1.76*; Potassium 4.3; Sodium 136 01/02/2016: Hemoglobin 10.9*; Platelets 388    Lipid Panel    Component Value Date/Time   CHOL 221* 05/03/2015 1653   TRIG 214* 05/03/2015 1653   HDL 75 05/03/2015 1653   CHOLHDL 2.9 05/03/2015 1653   VLDL 43* 05/03/2015 1653   LDLCALC 103* 05/03/2015 1653      Wt Readings from Last 3 Encounters:  01/04/16 144 lb 12.8 oz (65.681 kg)  01/02/16 155 lb (70.308 kg)  12/05/15 156 lb (70.761 kg)      Other studies Reviewed: Additional studies/ records that were reviewed today include: . Review of the above records demonstrates:   ASSESSMENT AND PLAN:  1.  Chest discomfort: Patient presents with some chest pains that are worrisome for coronary artery disease. He has chest discomfort that seems to increase when he climbs stairs. It is not associated with shortness of breath or diaphoresis. He has multiple risk factors for coronary artery disease-hypertension and diabetes mellitus. He has a history of a cardiac arrest while in hospital but in reading the admission notes it sounds like it was more of a respiratory arrest related to sepsis. He did not have a cardiac catheterization during that hospitalization.  I think our best option is to proceed with a [place] study. We will also get an echocardiogram  to evaluate his LV function. There is a question of an S3 gallop.  If the studies aren't relatively normal then we will see him back on an as-needed basis. If there are significant abnormalities and we'll see him back for follow-up. He had an echocardiogram in 2015 which  revealed normal left ventricle systolic function . He does have some diastolic dysfunction.   Current medicines are reviewed at length with the patient today.  The patient does not have concerns regarding medicines.  The following changes have been made:  no change  Labs/ tests ordered today include:  No orders of the defined types were placed in this encounter.     Disposition:   FU with me as needed.     Keirstyn Aydt, Wonda Cheng, MD  01/04/2016 10:19 AM    [facility] [address], [place], [place]  [address] Phone: [phone]; Fax: [phone]   [facility]  [address] [place], [place]  [address] [phone]   Fax [phone]

## 2016-01-05 NOTE — Telephone Encounter (Signed)
LVM form at front office ready to be pick up

## 2016-01-09 ENCOUNTER — Telehealth: Payer: Self-pay

## 2016-01-09 NOTE — Telephone Encounter (Signed)
-----   Message from Boykin Nearing, MD sent at 01/04/2016  8:38 AM EST ----- Patient with slight anemia, GI referral has already been placed for colonoscopy PSA elevated this can be due to enlarged gland but must rule possibility of prostate cancer, continue flomax, urology referral placed

## 2016-01-09 NOTE — Telephone Encounter (Signed)
CMA called patient, patient verified name and DOB. Patient was given lab results and information about referral to Urologist. He was also told to continue with the flomax as prescribed. Patient verbalized he understood with no further questions.

## 2016-01-15 ENCOUNTER — Telehealth (HOSPITAL_COMMUNITY): Payer: Self-pay

## 2016-01-15 NOTE — Telephone Encounter (Signed)
Encounter complete. 

## 2016-01-17 ENCOUNTER — Ambulatory Visit (HOSPITAL_COMMUNITY): Payer: Self-pay | Attending: Cardiovascular Disease

## 2016-01-17 ENCOUNTER — Other Ambulatory Visit: Payer: Self-pay

## 2016-01-17 ENCOUNTER — Ambulatory Visit (HOSPITAL_BASED_OUTPATIENT_CLINIC_OR_DEPARTMENT_OTHER): Payer: Self-pay

## 2016-01-17 DIAGNOSIS — I358 Other nonrheumatic aortic valve disorders: Secondary | ICD-10-CM | POA: Insufficient documentation

## 2016-01-17 DIAGNOSIS — I340 Nonrheumatic mitral (valve) insufficiency: Secondary | ICD-10-CM | POA: Insufficient documentation

## 2016-01-17 DIAGNOSIS — E119 Type 2 diabetes mellitus without complications: Secondary | ICD-10-CM | POA: Insufficient documentation

## 2016-01-17 DIAGNOSIS — R0789 Other chest pain: Secondary | ICD-10-CM | POA: Insufficient documentation

## 2016-01-17 DIAGNOSIS — I119 Hypertensive heart disease without heart failure: Secondary | ICD-10-CM | POA: Insufficient documentation

## 2016-01-17 DIAGNOSIS — R9439 Abnormal result of other cardiovascular function study: Secondary | ICD-10-CM | POA: Insufficient documentation

## 2016-01-17 LAB — MYOCARDIAL PERFUSION IMAGING
CHL CUP NUCLEAR SDS: 3
CHL CUP RESTING HR STRESS: 75 {beats}/min
CSEPPHR: 97 {beats}/min
LHR: 0.39
LV dias vol: 112 mL
LVSYSVOL: 42 mL
SRS: 2
SSS: 5
TID: 0.87

## 2016-01-17 MED ORDER — TECHNETIUM TC 99M SESTAMIBI GENERIC - CARDIOLITE
32.8000 | Freq: Once | INTRAVENOUS | Status: AC | PRN
  Administered 2016-01-17: 32.8 via INTRAVENOUS

## 2016-01-17 MED ORDER — TECHNETIUM TC 99M SESTAMIBI GENERIC - CARDIOLITE
10.6000 | Freq: Once | INTRAVENOUS | Status: AC | PRN
  Administered 2016-01-17: 11 via INTRAVENOUS

## 2016-01-17 MED ORDER — REGADENOSON 0.4 MG/5ML IV SOLN
0.4000 mg | Freq: Once | INTRAVENOUS | Status: AC
  Administered 2016-01-17: 0.4 mg via INTRAVENOUS

## 2016-01-19 ENCOUNTER — Encounter: Payer: Self-pay | Admitting: Clinical

## 2016-01-19 NOTE — Progress Notes (Signed)
Depression screen [facility] 2/9 01/02/2016 12/05/2015 06/14/2015 05/03/2015 08/03/2014  Decreased Interest 3 1 0 3 0  Down, Depressed, Hopeless 2 0 0 3 0  PHQ - 2 Score 5 1 0 6 0  Altered sleeping 3 1 - 0 -  Tired, decreased energy 3 1 - 0 -  Change in appetite 3 1 - 0 -  Feeling bad or failure about yourself  3 3 - 3 -  Trouble concentrating 3 3 - 3 -  Moving slowly or fidgety/restless 3 0 - 3 -  Suicidal thoughts 2 0 - 0 -  PHQ-9 Score 25 10 - 15 -  Difficult doing work/chores - - - Not difficult at all -   ** No plans to end life in last 2 weeks  GAD 7 : Generalized Anxiety Score 01/02/2016 12/05/2015  Nervous, Anxious, on Edge 3 2  Control/stop worrying 3 2  Worry too much - different things 3 3  Trouble relaxing 0 0  Restless 3 0  Easily annoyed or irritable 3 2  Afraid - awful might happen 3 1  Total GAD 7 Score 18 10

## 2016-01-30 ENCOUNTER — Encounter: Payer: Self-pay | Admitting: Pharmacist

## 2016-02-01 ENCOUNTER — Encounter (HOSPITAL_BASED_OUTPATIENT_CLINIC_OR_DEPARTMENT_OTHER): Payer: Self-pay | Admitting: *Deleted

## 2016-02-15 MED FILL — FLUTICASONE PROP 50 MCG SPR: 50 | 16 days supply | Qty: 16 | Fill #1

## 2016-02-15 MED FILL — ISOSORBIDE MN ER 30 MG TAB: 30 | 30 days supply | Qty: 30 | Fill #1

## 2016-02-15 MED FILL — ?CETIRIZINE HCL 10 MG TABLE: 10 | 30 days supply | Qty: 30 | Fill #1

## 2016-02-15 MED FILL — LOSARTAN POTASSIUM 25 MG TA: 25 | 30 days supply | Qty: 30 | Fill #1

## 2016-02-15 MED FILL — ?TAMSULOSIN HCL 0.4 MG CAP: 0.4 | 30 days supply | Qty: 60 | Fill #1

## 2016-02-15 MED FILL — hydrALAZINE HCL 50 MG TABS: 50 | 30 days supply | Qty: 90 | Fill #1

## 2016-02-19 ENCOUNTER — Encounter: Payer: Self-pay | Admitting: Pharmacist

## 2016-02-19 ENCOUNTER — Ambulatory Visit: Payer: Self-pay | Attending: Family Medicine | Admitting: Pharmacist

## 2016-02-19 VITALS — BP 151/79 | HR 88

## 2016-02-19 DIAGNOSIS — Z794 Long term (current) use of insulin: Secondary | ICD-10-CM

## 2016-02-19 DIAGNOSIS — I10 Essential (primary) hypertension: Secondary | ICD-10-CM | POA: Insufficient documentation

## 2016-02-19 DIAGNOSIS — E1122 Type 2 diabetes mellitus with diabetic chronic kidney disease: Secondary | ICD-10-CM

## 2016-02-19 DIAGNOSIS — Z79899 Other long term (current) drug therapy: Secondary | ICD-10-CM | POA: Insufficient documentation

## 2016-02-19 NOTE — Patient Instructions (Signed)
Pick up your medications from the pharmacy  Come back and see me in 2 weeks after taking all of your medications.  Hypertension Hypertension is another name for high blood pressure. High blood pressure forces your heart to work harder to pump blood. A blood pressure reading has two numbers, which includes a higher number over a lower number (example: 110/72). HOME CARE   Have your blood pressure rechecked by your doctor.  Only take medicine as told by your doctor. Follow the directions carefully. The medicine does not work as well if you skip doses. Skipping doses also puts you at risk for problems.  Do not smoke.  Monitor your blood pressure at home as told by your doctor. GET HELP IF:  You think you are having a reaction to the medicine you are taking.  You have repeat headaches or feel dizzy.  You have puffiness (swelling) in your ankles.  You have trouble with your vision. GET HELP RIGHT AWAY IF:   You get a very bad headache and are confused.  You feel weak, numb, or faint.  You get chest or belly (abdominal) pain.  You throw up (vomit).  You cannot breathe very well. MAKE SURE YOU:   Understand these instructions.  Will watch your condition.  Will get help right away if you are not doing well or get worse.   This information is not intended to replace advice given to you by your health care provider. Make sure you discuss any questions you have with your health care provider.   Document Released: 04/29/2008 Document Revised: 11/16/2013 Document Reviewed: 09/03/2013 Elsevier Interactive Patient Education [employer].

## 2016-02-19 NOTE — Progress Notes (Signed)
S:    Patient arrives in good spirits.    Presents to the clinic for hypertension evaluation. Patient was referred on 01/02/16 by Dr. Adrian Blackwater.  Patient was last seen by Primary Care Provider on 01/02/16.   Patient reports adherence with medications. He has one medication left - he thinks it may be hydralazine but he is out of his other blood pressure medications and has been out it for a few days. Whichever blood pressure medication he does have left, his last dose was yesterday.   Current BP Medications include:  Hydralazine 50 mg TID, losartan 25 mg daily, and isosorbide mononitrate 30 mg daily.    O:   Last 3 Office BP readings: BP Readings from Last 3 Encounters:  02/19/16 151/79  01/04/16 148/90  01/02/16 150/82    BMET    Component Value Date/Time   NA 136 12/05/2015 1128   K 4.3 12/05/2015 1128   CL 103 12/05/2015 1128   CO2 25 12/05/2015 1128   GLUCOSE 221* 12/05/2015 1128   BUN 25 12/05/2015 1128   CREATININE 1.76* 12/05/2015 1128   CREATININE 1.05 07/19/2014 0651   CALCIUM 8.8 12/05/2015 1128   GFRNONAA 40* 12/05/2015 1128   GFRNONAA 74* 07/19/2014 0651   GFRAA 46* 12/05/2015 1128   GFRAA 86* 07/19/2014 0651    A/P: Hypertension longstanding currently UNcontrolled on current medications due to nonadherence to medications.  Continued all medications as prescribed and patient to pick up his medications from pharmacy before leaving today. Stressed the importance of getting his refills on time and not missing any doses of his medications. Patient verbalized understanding.  Results reviewed and written information provided.   Total time in face-to-face counseling 20 minutes.  F/U Clinic Visit with Dr. Adrian Blackwater.

## 2016-02-21 ENCOUNTER — Telehealth: Payer: Self-pay | Admitting: Family Medicine

## 2016-02-21 DIAGNOSIS — N400 Enlarged prostate without lower urinary tract symptoms: Secondary | ICD-10-CM

## 2016-02-21 NOTE — Telephone Encounter (Signed)
Patient is experiencing dizziness after taking Flomax....Marland KitchenMarland Kitchenplease follow up

## 2016-02-22 ENCOUNTER — Encounter (HOSPITAL_BASED_OUTPATIENT_CLINIC_OR_DEPARTMENT_OTHER): Payer: Self-pay

## 2016-02-26 MED ORDER — TAMSULOSIN HCL 0.4 MG PO CAPS: 0.4000 mg | ORAL_CAPSULE | Freq: Every day | ORAL | Status: DC

## 2016-02-26 NOTE — Telephone Encounter (Signed)
Advise patient to decrease flomax 0.4 mg daily, Rx changed in chart

## 2016-02-26 NOTE — Telephone Encounter (Signed)
Unable to contact pt  No voice message

## 2016-02-27 NOTE — Telephone Encounter (Signed)
Unable to contact Pt x2 No voice message

## 2016-03-13 ENCOUNTER — Ambulatory Visit: Payer: Self-pay | Attending: Family Medicine | Admitting: Family Medicine

## 2016-03-13 ENCOUNTER — Encounter: Payer: Self-pay | Admitting: Family Medicine

## 2016-03-13 VITALS — BP 178/79 | HR 72 | Temp 98.3°F | Resp 18 | Ht 63.0 in | Wt 159.0 lb

## 2016-03-13 DIAGNOSIS — Z7982 Long term (current) use of aspirin: Secondary | ICD-10-CM | POA: Insufficient documentation

## 2016-03-13 DIAGNOSIS — I129 Hypertensive chronic kidney disease with stage 1 through stage 4 chronic kidney disease, or unspecified chronic kidney disease: Secondary | ICD-10-CM | POA: Insufficient documentation

## 2016-03-13 DIAGNOSIS — M545 Low back pain, unspecified: Secondary | ICD-10-CM

## 2016-03-13 DIAGNOSIS — Z79899 Other long term (current) drug therapy: Secondary | ICD-10-CM | POA: Insufficient documentation

## 2016-03-13 DIAGNOSIS — E1122 Type 2 diabetes mellitus with diabetic chronic kidney disease: Secondary | ICD-10-CM | POA: Insufficient documentation

## 2016-03-13 DIAGNOSIS — I10 Essential (primary) hypertension: Secondary | ICD-10-CM

## 2016-03-13 DIAGNOSIS — Z87891 Personal history of nicotine dependence: Secondary | ICD-10-CM | POA: Insufficient documentation

## 2016-03-13 DIAGNOSIS — Z794 Long term (current) use of insulin: Secondary | ICD-10-CM | POA: Insufficient documentation

## 2016-03-13 DIAGNOSIS — N189 Chronic kidney disease, unspecified: Secondary | ICD-10-CM | POA: Insufficient documentation

## 2016-03-13 LAB — POCT GLYCOSYLATED HEMOGLOBIN (HGB A1C): Hemoglobin A1C: 7.3

## 2016-03-13 LAB — GLUCOSE, POCT (MANUAL RESULT ENTRY): POC Glucose: 117 mg/dl — AB (ref 70–99)

## 2016-03-13 MED ORDER — KETOROLAC TROMETHAMINE 30 MG/ML IJ SOLN: 30.0000 mg | Freq: Once | INTRAMUSCULAR | Status: DC

## 2016-03-13 MED ORDER — METHYLPREDNISOLONE 4 MG PO TBPK
ORAL_TABLET | ORAL | Status: DC
Start: 2016-03-13 — End: 2016-04-02

## 2016-03-13 MED ORDER — ACETAMINOPHEN ER 650 MG PO TBCR: 650.0000 mg | EXTENDED_RELEASE_TABLET | Freq: Three times a day (TID) | ORAL | Status: DC | PRN

## 2016-03-13 MED ORDER — CYCLOBENZAPRINE HCL 10 MG PO TABS: 10.0000 mg | ORAL_TABLET | Freq: Every day | ORAL | Status: DC

## 2016-03-13 MED FILL — $LANTUS SOLOSTAR 100 UNITS/: 100 | 60 days supply | Qty: 3 | Fill #0

## 2016-03-13 MED FILL — METHYLPREDNISOLONE 4 MG TAB: 4 | 6 days supply | Qty: 21 | Fill #0

## 2016-03-13 MED FILL — ?CYCLOBENZAPRINE 10 MG TABL: 10 | 7 days supply | Qty: 7 | Fill #0

## 2016-03-13 NOTE — Patient Instructions (Addendum)
Joshua [name] was seen today for back pain.  Diagnoses and all orders for this visit:  Type 2 diabetes mellitus with chronic kidney disease, with long-term current use of insulin, unspecified CKD stage (HCC) -     POCT A1C -     Glucose (CBG)  Acute low back pain -     cyclobenzaprine (FLEXERIL) 10 MG tablet; Take 1 tablet (10 mg total) by mouth at bedtime. -     methylPREDNISolone (MEDROL DOSEPAK) 4 MG TBPK tablet; Taper per packet insert -     ketorolac (TORADOL) 30 MG/ML injection 30 mg; Inject 1 mL (30 mg total) into the muscle once.  Essential hypertension  Be sure to take 50 mg of hydralazine three times daily, you have an old bottle of 25 mg  Do not take OTC NSAIDs including ibuprofen, aleve, goody or BC powder. Tylenol is safer for pain.  F.u in 4 week for HTN  Dr. Adrian Blackwater

## 2016-03-13 NOTE — Progress Notes (Signed)
Patient is here for back pain  Patient complains of left lower back pain being present. Currently scaled at a 1. Described as aching.  Patient has taken medication and patient has eaten today.

## 2016-03-13 NOTE — Assessment & Plan Note (Signed)
A; HTN with BP uncontrolled Med: compliant P: Avoid all oral NSAIDs  Increase hydralazine to 50 mg TID as prescribed.

## 2016-03-13 NOTE — Assessment & Plan Note (Signed)
A; acute low back consistent with lumbar muscle spasm P: Avoid oral NSAID due to CKD DepoMedrol dose pak Flexeril x one week Tylenol prn pain

## 2016-03-13 NOTE — Progress Notes (Signed)
Subjective:  Patient ID: Joshua [name], male    DOB: 02-Sep-1951  Age: 65 y.o. MRN: [id number]  CC: Back Pain   HPI Joshua [name] presents for   1. L sided low back pain: started while sleeping about two weeks ago. Has hx of low back pain. No recent injury. No fever or chills. No fecal or urinary incontinence. 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 He has CKD.   2. HTN: taking all meds. However, upon review of meds I noticed that he is taking 25 mg of hydralazine TID instead of the prescribed 50 mg. No HA, CP, SOB. Recent ECHO revealed normal EF with mild diastolic dysfunction.   Social History  Substance Use Topics  . Smoking status: Former Smoker -- 20 years    Types: Cigarettes    Quit date: 11/25/1992  . Smokeless tobacco: Never Used  . Alcohol Use: No     Comment: Rarely.   Outpatient Prescriptions Prior to Visit  Medication Sig Dispense Refill  . acetaminophen (TYLENOL) 325 MG tablet Take 325 mg by mouth every 4 (four) hours as needed for mild pain, moderate pain or fever. Reported on 12/05/2015    . aspirin EC 81 MG tablet Take 1 tablet (81 mg total) by mouth daily. 90 tablet 3  . cetirizine (ZYRTEC) 10 MG tablet Take 1 tablet (10 mg total) by mouth daily. 30 tablet 11  . fluticasone (FLONASE) 50 MCG/ACT nasal spray Place 2 sprays into both nostrils daily. 16 g 2  . gabapentin (NEURONTIN) 100 MG capsule Take 1 capsule (100 mg total) by mouth 3 (three) times daily. 270 capsule 1  . glimepiride (AMARYL) 2 MG tablet Take 2 mg by mouth daily with breakfast. Reported on 01/02/2016    . hydrALAZINE (APRESOLINE) 50 MG tablet Take 1 tablet (50 mg total) by mouth 3 (three) times daily. 90 tablet 5  . Insulin Glargine (LANTUS SOLOSTAR) 100 UNIT/ML Solostar Pen Inject 5 Units into the skin daily at 10 pm. 45 pen 3  . Insulin Pen Needle (ULTICARE MICRO PEN NEEDLES) 32G X 4 MM MISC 1 each by Does not apply route daily. 100 each 3  . isosorbide mononitrate (IMDUR) 30 MG 24 hr  tablet Take 1 tablet (30 mg total) by mouth daily. (Patient not taking: Reported on 02/19/2016) 90 tablet 3  . losartan (COZAAR) 25 MG tablet Take 1 tablet (25 mg total) by mouth daily. (Patient not taking: Reported on 02/19/2016) 30 tablet 5  . OVER THE COUNTER MEDICATION Take 2 sprays by mouth daily. Reported on 01/02/2016    . tamsulosin (FLOMAX) 0.4 MG CAPS capsule Take 1 capsule (0.4 mg total) by mouth daily after supper. 30 capsule 3   No facility-administered medications prior to visit.    ROS Review of Systems  Constitutional: Negative for fever, chills, fatigue and unexpected weight change.  Eyes: Negative for visual disturbance.  Respiratory: Negative for cough and shortness of breath.   Cardiovascular: Negative for chest pain, palpitations and leg swelling.  Gastrointestinal: Negative for nausea, vomiting, diarrhea, constipation and blood in stool.  Endocrine: Negative for polydipsia, polyphagia and polyuria.  Genitourinary: Negative for dysuria, flank pain and difficulty urinating.  Musculoskeletal: Positive for back pain and gait problem. Negative for myalgias, arthralgias and neck pain.  Skin: Negative for rash.  Allergic/Immunologic: Negative for immunocompromised state.  Hematological: Negative for adenopathy. Does not bruise/bleed easily.  Psychiatric/Behavioral: Negative for suicidal ideas, sleep disturbance and dysphoric mood. The patient is not  nervous/anxious.     Objective:  BP 178/79 mmHg  Pulse 72  Temp(Src) 98.3 F (36.8 C) (Oral)  Resp 18  Ht 5\' 3"  (1.6 m)  Wt 159 lb (72.122 kg)  BMI 28.17 kg/m2  SpO2 99%  BP/Weight 03/13/2016 0000000 [id number]  Systolic BP 0000000 [id number]  Diastolic BP 79 79 90  Wt. (Lbs) 159 - 144.8  BMI 28.17 - 24.1   Physical Exam  Constitutional: He appears well-developed and well-nourished. No distress.  HENT:  Head: Normocephalic and atraumatic.  Neck: Normal range of motion. Neck supple.  Cardiovascular: Normal rate, regular  rhythm, normal heart sounds and intact distal pulses.   Pulmonary/Chest: Effort normal and breath sounds normal.  Musculoskeletal: He exhibits no edema.       Lumbar back: He exhibits decreased range of motion, tenderness, pain and spasm. He exhibits no bony tenderness, no swelling, no edema, no deformity and normal pulse.       Back:  Neurological: He is alert.  Skin: Skin is warm and dry. No rash noted. No erythema.  Psychiatric: He has a normal mood and affect.    Lab Results  Component Value Date   HGBA1C 7.3 03/13/2016   CBG 117 Assessment & Plan:   Chrishon was seen today for back pain.  Diagnoses and all orders for this visit:  Type 2 diabetes mellitus with chronic kidney disease, with long-term current use of insulin, unspecified CKD stage (HCC) -     POCT A1C -     Glucose (CBG)  Acute low back pain -     cyclobenzaprine (FLEXERIL) 10 MG tablet; Take 1 tablet (10 mg total) by mouth at bedtime. -     methylPREDNISolone (MEDROL DOSEPAK) 4 MG TBPK tablet; Taper per packet insert -     Discontinue: ketorolac (TORADOL) 30 MG/ML injection 30 mg; Inject 1 mL (30 mg total) into the muscle once. -     acetaminophen (TYLENOL 8 HOUR) 650 MG CR tablet; Take 1 tablet (650 mg total) by mouth every 8 (eight) hours as needed for pain.  Essential hypertension     No orders of the defined types were placed in this encounter.    Follow-up: No Follow-up on file.   [name]

## 2016-03-15 ENCOUNTER — Ambulatory Visit (HOSPITAL_BASED_OUTPATIENT_CLINIC_OR_DEPARTMENT_OTHER): Payer: Self-pay | Attending: Family Medicine

## 2016-04-02 ENCOUNTER — Encounter: Payer: Self-pay | Admitting: Family Medicine

## 2016-04-02 ENCOUNTER — Telehealth: Payer: Self-pay | Admitting: Family Medicine

## 2016-04-02 ENCOUNTER — Ambulatory Visit: Payer: Self-pay | Attending: Family Medicine | Admitting: Family Medicine

## 2016-04-02 VITALS — BP 169/90 | HR 89 | Temp 97.8°F | Resp 16 | Ht 63.0 in | Wt 156.0 lb

## 2016-04-02 DIAGNOSIS — K0889 Other specified disorders of teeth and supporting structures: Secondary | ICD-10-CM

## 2016-04-02 DIAGNOSIS — N183 Chronic kidney disease, stage 3 unspecified: Secondary | ICD-10-CM

## 2016-04-02 DIAGNOSIS — Z87891 Personal history of nicotine dependence: Secondary | ICD-10-CM | POA: Insufficient documentation

## 2016-04-02 DIAGNOSIS — N400 Enlarged prostate without lower urinary tract symptoms: Secondary | ICD-10-CM | POA: Insufficient documentation

## 2016-04-02 DIAGNOSIS — I10 Essential (primary) hypertension: Secondary | ICD-10-CM

## 2016-04-02 DIAGNOSIS — I129 Hypertensive chronic kidney disease with stage 1 through stage 4 chronic kidney disease, or unspecified chronic kidney disease: Secondary | ICD-10-CM | POA: Insufficient documentation

## 2016-04-02 DIAGNOSIS — E1122 Type 2 diabetes mellitus with diabetic chronic kidney disease: Secondary | ICD-10-CM | POA: Insufficient documentation

## 2016-04-02 DIAGNOSIS — Z794 Long term (current) use of insulin: Secondary | ICD-10-CM | POA: Insufficient documentation

## 2016-04-02 DIAGNOSIS — Z79899 Other long term (current) drug therapy: Secondary | ICD-10-CM | POA: Insufficient documentation

## 2016-04-02 DIAGNOSIS — Z7982 Long term (current) use of aspirin: Secondary | ICD-10-CM | POA: Insufficient documentation

## 2016-04-02 LAB — BASIC METABOLIC PANEL WITH GFR
BUN: 32 mg/dL — ABNORMAL HIGH (ref 7–25)
CALCIUM: 8.7 mg/dL (ref 8.6–10.3)
CO2: 23 mmol/L (ref 20–31)
Chloride: 106 mmol/L (ref 98–110)
Creat: 2.04 mg/dL — ABNORMAL HIGH (ref 0.70–1.25)
GFR, EST AFRICAN AMERICAN: 39 mL/min — AB (ref >=60)
GFR, EST NON AFRICAN AMERICAN: 33 mL/min — AB (ref >=60)
GLUCOSE: 116 mg/dL — AB (ref 65–99)
Potassium: 4.9 mmol/L (ref 3.5–5.3)
Sodium: 141 mmol/L (ref 135–146)

## 2016-04-02 LAB — GLUCOSE, POCT (MANUAL RESULT ENTRY): POC Glucose: 98 mg/dl (ref 70–99)

## 2016-04-02 MED ORDER — TAMSULOSIN HCL 0.4 MG PO CAPS: 0.4000 mg | ORAL_CAPSULE | Freq: Every day | ORAL | Status: DC

## 2016-04-02 MED ORDER — CLONIDINE HCL 0.1 MG PO TABS
0.2000 mg | ORAL_TABLET | Freq: Once | ORAL | Status: AC
  Administered 2016-04-02: 0.2 mg via ORAL

## 2016-04-02 MED ORDER — LOSARTAN POTASSIUM 25 MG PO TABS: 25.0000 mg | ORAL_TABLET | Freq: Every day | ORAL | Status: DC

## 2016-04-02 MED ORDER — ISOSORBIDE MONONITRATE ER 30 MG PO TB24: 30.0000 mg | ORAL_TABLET | Freq: Every day | ORAL | Status: DC

## 2016-04-02 MED ORDER — HYDRALAZINE HCL 50 MG PO TABS: 50.0000 mg | ORAL_TABLET | Freq: Three times a day (TID) | ORAL | Status: DC

## 2016-04-02 MED FILL — ISOSORBIDE MN ER 30 MG TAB: 30 | 90 days supply | Qty: 90 | Fill #0

## 2016-04-02 MED FILL — hydrALAZINE HCL 50 MG TABS: 50 | 90 days supply | Qty: 270 | Fill #0

## 2016-04-02 MED FILL — TAMSULOSIN HCL 0.4 MG CAP: 0.4 | 30 days supply | Qty: 30 | Fill #0

## 2016-04-02 MED FILL — LOSARTAN POTASSIUM 25 MG TA: 25 | 90 days supply | Qty: 90 | Fill #0

## 2016-04-02 NOTE — Patient Instructions (Addendum)
Joshua [name] was seen today for medication refill and numbness.  Diagnoses and all orders for this visit:  Type 2 diabetes mellitus with chronic kidney disease, with long-term current use of insulin, unspecified CKD stage (HCC) -     POCT glucose (manual entry) -     Ambulatory referral to Ophthalmology  Essential hypertension -     cloNIDine (CATAPRES) tablet 0.2 mg; Take 2 tablets (0.2 mg total) by mouth once. -     losartan (COZAAR) 25 MG tablet; Take 1 tablet (25 mg total) by mouth daily. -     isosorbide mononitrate (IMDUR) 30 MG 24 hr tablet; Take 1 tablet (30 mg total) by mouth daily. -     hydrALAZINE (APRESOLINE) 50 MG tablet; Take 1 tablet (50 mg total) by mouth 3 (three) times daily.  BPH (benign prostatic hyperplasia) -     tamsulosin (FLOMAX) 0.4 MG CAPS capsule; Take 1 capsule (0.4 mg total) by mouth daily after supper.  Pain of molar -     Ambulatory referral to Dentistry   F/u in 6 weeks for BP check  Dr. Adrian Blackwater

## 2016-04-02 NOTE — Progress Notes (Signed)
C/C legs swelling and numbness  No pain today  No tobacco user  No suicidal thoughts in the past two weeks  Pt stated out of medication x 2 weeks  Had insulin last night

## 2016-04-02 NOTE — Telephone Encounter (Signed)
Patient dropped off paperwork for Handicapped placard to be completed by the doctor. Please follow up.

## 2016-04-02 NOTE — Assessment & Plan Note (Addendum)
BP uncontrolled with leg swelling due to non compliance  Refilled all meds Reminded patient of risk of blindness and kidney failure if he does not take all meds as prescribed  Close f/u for repeat BP in 6 weeks

## 2016-04-02 NOTE — Progress Notes (Signed)
Subjective:  Patient ID: Joshua [name], male    DOB: May 31, 1951  Age: 65 y.o. MRN: [id number]  CC: Medication Refill and Numbness   HPI [name] presents for   1. CHRONIC HYPERTENSION  Disease Monitoring  Blood pressure range: not checking   Chest pain: no   Dyspnea: no   Claudication: no   Medication compliance: no  Medication Side Effects  Lightheadedness: no   Urinary frequency: no   Edema: yes, R leg       2. Swelling: in R LE with numbness when sitting with legs hanging. Improved with elevation or walking. Not taking all of his BP medicine. Taking gabapentin at home.   3. Decreased vision: no eye pain. Has not yet seen ophthalmologist   4. Dental pain: request dental referral.    Social History  Substance Use Topics  . Smoking status: Former Smoker -- 20 years    Types: Cigarettes    Quit date: 11/25/1992  . Smokeless tobacco: Never Used  . Alcohol Use: No     Comment: Rarely.    Outpatient Prescriptions Prior to Visit  Medication Sig Dispense Refill  . acetaminophen (TYLENOL 8 HOUR) 650 MG CR tablet Take 1 tablet (650 mg total) by mouth every 8 (eight) hours as needed for pain. 90 tablet 2  . aspirin EC 81 MG tablet Take 1 tablet (81 mg total) by mouth daily. 90 tablet 3  . cetirizine (ZYRTEC) 10 MG tablet Take 1 tablet (10 mg total) by mouth daily. 30 tablet 11  . cyclobenzaprine (FLEXERIL) 10 MG tablet Take 1 tablet (10 mg total) by mouth at bedtime. 7 tablet 0  . tamsulosin (FLOMAX) 0.4 MG CAPS capsule Take 1 capsule (0.4 mg total) by mouth daily after supper. 30 capsule 3  . fluticasone (FLONASE) 50 MCG/ACT nasal spray Place 2 sprays into both nostrils daily. 16 g 2  . gabapentin (NEURONTIN) 100 MG capsule Take 1 capsule (100 mg total) by mouth 3 (three) times daily. 270 capsule 1  . glimepiride (AMARYL) 2 MG tablet Take 2 mg by mouth daily with breakfast. Reported on 01/02/2016    . hydrALAZINE (APRESOLINE) 50 MG tablet Take 1 tablet (50 mg  total) by mouth 3 (three) times daily. (Patient not taking: Reported on 03/13/2016) 90 tablet 5  . Insulin Glargine (LANTUS SOLOSTAR) 100 UNIT/ML Solostar Pen Inject 5 Units into the skin daily at 10 pm. 45 pen 3  . Insulin Pen Needle (ULTICARE MICRO PEN NEEDLES) 32G X 4 MM MISC 1 each by Does not apply route daily. 100 each 3  . isosorbide mononitrate (IMDUR) 30 MG 24 hr tablet Take 1 tablet (30 mg total) by mouth daily. 90 tablet 3  . losartan (COZAAR) 25 MG tablet Take 1 tablet (25 mg total) by mouth daily. 30 tablet 5  . methylPREDNISolone (MEDROL DOSEPAK) 4 MG TBPK tablet Taper per packet insert 21 tablet 0  . OVER THE COUNTER MEDICATION Take 2 sprays by mouth daily. Reported on 01/02/2016     No facility-administered medications prior to visit.    ROS Review of Systems  Constitutional: Negative for fever, chills, fatigue and unexpected weight change.  HENT: Positive for dental problem.   Eyes: Positive for visual disturbance.  Respiratory: Negative for cough and shortness of breath.   Cardiovascular: Positive for leg swelling. Negative for chest pain and palpitations.  Gastrointestinal: Negative for nausea, vomiting, diarrhea, constipation and blood in stool.  Endocrine: Negative for polydipsia, polyphagia and polyuria.  Genitourinary: Negative for dysuria, flank pain and difficulty urinating.  Musculoskeletal: Positive for gait problem. Negative for myalgias, back pain, arthralgias and neck pain.  Skin: Negative for rash.  Allergic/Immunologic: Negative for immunocompromised state.  Neurological: Positive for numbness (in feet ).  Hematological: Negative for adenopathy. Does not bruise/bleed easily.  Psychiatric/Behavioral: Negative for suicidal ideas, sleep disturbance and dysphoric mood. The patient is not nervous/anxious.     Objective:  BP 199/99 mmHg  Pulse 79  Temp(Src) 97.8 F (36.6 C) (Oral)  Resp 16  Ht 5\' 3"  (1.6 m)  Wt 156 lb (70.761 kg)  BMI 27.64 kg/m2  SpO2  97%  BP/Weight 04/02/2016 03/13/2016 0000000  Systolic BP 123XX123 0000000 123XX123  Diastolic BP 99 79 79  Wt. (Lbs) 156 159 -  BMI 27.64 28.17 -    Physical Exam  Constitutional: He appears well-developed and well-nourished. No distress.  HENT:  Head: Normocephalic and atraumatic.  Neck: Normal range of motion. Neck supple.  Cardiovascular: Normal rate, regular rhythm, normal heart sounds and intact distal pulses.   Pulmonary/Chest: Effort normal and breath sounds normal.  Musculoskeletal: He exhibits no edema.       Lumbar back: He exhibits decreased range of motion, tenderness, pain and spasm. He exhibits no bony tenderness, no swelling, no edema, no deformity and normal pulse.       Back:  Neurological: He is alert.  Skin: Skin is warm and dry. No rash noted. No erythema.  Psychiatric: He has a normal mood and affect.   Lab Results  Component Value Date   HGBA1C 7.3 03/13/2016   CBG 98 Treated with clonidine 0.2 mg PO x 1, repeat BP 169/90 Assessment & Plan:   Claris was seen today for medication refill and numbness.  Diagnoses and all orders for this visit:  Type 2 diabetes mellitus with chronic kidney disease, with long-term current use of insulin, unspecified CKD stage (HCC) -     POCT glucose (manual entry) -     Ambulatory referral to Ophthalmology  Essential hypertension -     cloNIDine (CATAPRES) tablet 0.2 mg; Take 2 tablets (0.2 mg total) by mouth once. -     losartan (COZAAR) 25 MG tablet; Take 1 tablet (25 mg total) by mouth daily. -     isosorbide mononitrate (IMDUR) 30 MG 24 hr tablet; Take 1 tablet (30 mg total) by mouth daily. -     hydrALAZINE (APRESOLINE) 50 MG tablet; Take 1 tablet (50 mg total) by mouth 3 (three) times daily. -     BASIC METABOLIC PANEL WITH GFR  BPH (benign prostatic hyperplasia) -     tamsulosin (FLOMAX) 0.4 MG CAPS capsule; Take 1 capsule (0.4 mg total) by mouth daily after supper.  Pain of molar -     Ambulatory referral to  Dentistry  CKD (chronic kidney disease) stage 3, GFR 30-59 ml/min -     BASIC METABOLIC PANEL WITH GFR    Meds ordered this encounter  Medications  . cloNIDine (CATAPRES) tablet 0.2 mg    Sig:     Follow-up: No Follow-up on file.   [name]

## 2016-04-03 ENCOUNTER — Other Ambulatory Visit: Payer: Self-pay | Admitting: Pharmacist

## 2016-04-03 MED ORDER — INSULIN PEN NEEDLE 32G X 4 MM MISC: 1.0000 | Freq: Every day | Status: DC

## 2016-04-03 NOTE — Telephone Encounter (Signed)
Handicap placard form received, signed and returned to patient on 04/02/2016

## 2016-04-05 ENCOUNTER — Telehealth: Payer: Self-pay | Admitting: Family Medicine

## 2016-04-05 NOTE — Telephone Encounter (Signed)
Patient needs needles. Please follow up.

## 2016-04-08 ENCOUNTER — Telehealth: Payer: Self-pay | Admitting: *Deleted

## 2016-04-08 NOTE — Telephone Encounter (Signed)
Needles sent 04/03/16

## 2016-04-08 NOTE — Telephone Encounter (Signed)
Date of birth verified by pt  Results given  Declining kidney function. Advised to be compliant with all medication  F/U BP and repeat labs in 4-6 weeks Pt verbalized understanding  Stated taking medication as prescribed

## 2016-04-08 NOTE — Telephone Encounter (Signed)
-----   Message from Boykin Nearing, MD sent at 04/03/2016 10:46 AM EDT ----- Declining kidney function in setting of uncontrolled HTN Patient needs to be compliant with all meds or he will develop kidney failure  Close f/u for BP check and repeat labs needed in 4-6 weeks

## 2016-05-14 ENCOUNTER — Ambulatory Visit: Payer: Self-pay | Admitting: Family Medicine

## 2016-06-13 ENCOUNTER — Ambulatory Visit: Payer: Self-pay | Attending: Family Medicine | Admitting: Family Medicine

## 2016-06-13 ENCOUNTER — Encounter: Payer: Self-pay | Admitting: Family Medicine

## 2016-06-13 VITALS — BP 140/72 | HR 82 | Temp 98.5°F | Ht 63.0 in | Wt 158.0 lb

## 2016-06-13 DIAGNOSIS — I10 Essential (primary) hypertension: Secondary | ICD-10-CM

## 2016-06-13 DIAGNOSIS — N183 Chronic kidney disease, stage 3 unspecified: Secondary | ICD-10-CM

## 2016-06-13 DIAGNOSIS — I2510 Atherosclerotic heart disease of native coronary artery without angina pectoris: Secondary | ICD-10-CM

## 2016-06-13 DIAGNOSIS — E1142 Type 2 diabetes mellitus with diabetic polyneuropathy: Secondary | ICD-10-CM

## 2016-06-13 DIAGNOSIS — Z794 Long term (current) use of insulin: Secondary | ICD-10-CM

## 2016-06-13 DIAGNOSIS — E1122 Type 2 diabetes mellitus with diabetic chronic kidney disease: Secondary | ICD-10-CM

## 2016-06-13 LAB — GLUCOSE, POCT (MANUAL RESULT ENTRY): POC Glucose: 261 mg/dl — AB (ref 70–99)

## 2016-06-13 LAB — POCT GLYCOSYLATED HEMOGLOBIN (HGB A1C): Hemoglobin A1C: 7

## 2016-06-13 MED ORDER — LOSARTAN POTASSIUM 50 MG PO TABS: 50.0000 mg | ORAL_TABLET | Freq: Every day | ORAL | Status: DC

## 2016-06-13 MED ORDER — GABAPENTIN 100 MG PO CAPS: 100.0000 mg | ORAL_CAPSULE | Freq: Three times a day (TID) | ORAL | Status: DC

## 2016-06-13 MED ORDER — INSULIN GLARGINE 100 UNIT/ML SOLOSTAR PEN: 5.0000 [IU] | PEN_INJECTOR | Freq: Every day | SUBCUTANEOUS | Status: DC

## 2016-06-13 MED ORDER — HYDRALAZINE HCL 50 MG PO TABS: 50.0000 mg | ORAL_TABLET | Freq: Three times a day (TID) | ORAL | Status: DC

## 2016-06-13 MED ORDER — ASPIRIN EC 81 MG PO TBEC: 81.0000 mg | DELAYED_RELEASE_TABLET | Freq: Every day | ORAL | Status: DC

## 2016-06-13 MED ORDER — ISOSORBIDE MONONITRATE ER 30 MG PO TB24: 30.0000 mg | ORAL_TABLET | Freq: Every day | ORAL | Status: DC

## 2016-06-13 MED FILL — $LANTUS SOLOSTAR 100 UNITS/: 100 | 60 days supply | Qty: 3 | Fill #1

## 2016-06-13 MED FILL — GABAPENTIN 100 MG CAPSULE: 100 | 30 days supply | Qty: 90 | Fill #0

## 2016-06-13 MED FILL — LOSARTAN POTASSIUM 50 MG TA: 50 | 30 days supply | Qty: 30 | Fill #0

## 2016-06-13 NOTE — Assessment & Plan Note (Signed)
A; HTN improved Med: compliant P: Increase losartan from 25 to 50 mg daily Goal BP < 140/90 at all times

## 2016-06-13 NOTE — Assessment & Plan Note (Signed)
CKD due to HTN and DM2  Plan: BMP with GFR today Control BP Control CBGs Avoid NSAIDs

## 2016-06-13 NOTE — Patient Instructions (Addendum)
Wynn was seen today for follow-up and chronic kidney disease.  Diagnoses and all orders for this visit:  CKD (chronic kidney disease) stage 3, GFR 30-59 ml/min -     Cancel: BASIC METABOLIC PANEL WITH GFR; Future -     BASIC METABOLIC PANEL WITH GFR  Type 2 diabetes mellitus with stage 3 chronic kidney disease, with long-term current use of insulin (HCC) -     POCT A1C -     Glucose (CBG) -     Lipid Panel  Type 2 diabetes mellitus with chronic kidney disease, with long-term current use of insulin, unspecified CKD stage (HCC) -     Insulin Glargine (LANTUS SOLOSTAR) 100 UNIT/ML Solostar Pen; Inject 5 Units into the skin daily at 10 pm.  Diabetic neuropathy -     gabapentin (NEURONTIN) 100 MG capsule; Take 1 capsule (100 mg total) by mouth 3 (three) times daily.  Essential hypertension -     hydrALAZINE (APRESOLINE) 50 MG tablet; Take 1 tablet (50 mg total) by mouth 3 (three) times daily. -     losartan (COZAAR) 50 MG tablet; Take 1 tablet (50 mg total) by mouth daily. -     isosorbide mononitrate (IMDUR) 30 MG 24 hr tablet; Take 1 tablet (30 mg total) by mouth daily.  Mild CAD -     aspirin EC 81 MG tablet; Take 1 tablet (81 mg total) by mouth daily. -     Lipid Panel   Go to the lab for blood work   F/u in 3 months for diabetes, HTN, CKD  Dr. Adrian Blackwater

## 2016-06-13 NOTE — Assessment & Plan Note (Signed)
Refilled gabapentin

## 2016-06-13 NOTE — Progress Notes (Signed)
Subjective:  Patient ID: Joshua [name], male    DOB: March 21, 1951  Age: 65 y.o. MRN: [id number]  CC: Follow-up and Chronic Kidney Disease   HPI Joshua [name] has diabetes, HTN and CKD stage 3 he presents for    1. CKD: trace swelling in legs. No CP or SOB.   2. DM2: taking lantus 5 U only. No amaryl. Out of lantus for past 2 weeks. Has neuropathy in feet and is also s/p L transmetarsal amputation. Feels like there is water under his heels. Not taking gabapentin.   3. HTN: taking norvasc, imdur and hydralazine. No CP since starting imdur. Also takes daily ASA. No HA or SOB either.   Social History  Substance Use Topics  . Smoking status: Former Smoker -- 20 years    Types: Cigarettes    Quit date: 11/25/1992  . Smokeless tobacco: Never Used  . Alcohol Use: No     Comment: Rarely.    Outpatient Prescriptions Prior to Visit  Medication Sig Dispense Refill  . hydrALAZINE (APRESOLINE) 50 MG tablet Take 1 tablet (50 mg total) by mouth 3 (three) times daily. 270 tablet 3  . Insulin Glargine (LANTUS SOLOSTAR) 100 UNIT/ML Solostar Pen Inject 5 Units into the skin daily at 10 pm. 45 pen 3  . Insulin Pen Needle (ULTICARE MICRO PEN NEEDLES) 32G X 4 MM MISC 1 each by Does not apply route daily. 100 each 3  . isosorbide mononitrate (IMDUR) 30 MG 24 hr tablet Take 1 tablet (30 mg total) by mouth daily. 90 tablet 3  . losartan (COZAAR) 25 MG tablet Take 1 tablet (25 mg total) by mouth daily. 90 tablet 3  . OVER THE COUNTER MEDICATION Take 2 sprays by mouth daily. Reported on 01/02/2016    . tamsulosin (FLOMAX) 0.4 MG CAPS capsule Take 1 capsule (0.4 mg total) by mouth daily after supper. 30 capsule 5  . acetaminophen (TYLENOL 8 HOUR) 650 MG CR tablet Take 1 tablet (650 mg total) by mouth every 8 (eight) hours as needed for pain. (Patient not taking: Reported on 06/13/2016) 90 tablet 2  . aspirin EC 81 MG tablet Take 1 tablet (81 mg total) by mouth daily. (Patient not taking: Reported on  06/13/2016) 90 tablet 3  . cetirizine (ZYRTEC) 10 MG tablet Take 1 tablet (10 mg total) by mouth daily. (Patient not taking: Reported on 06/13/2016) 30 tablet 11  . fluticasone (FLONASE) 50 MCG/ACT nasal spray Place 2 sprays into both nostrils daily. (Patient not taking: Reported on 06/13/2016) 16 g 2  . gabapentin (NEURONTIN) 100 MG capsule Take 1 capsule (100 mg total) by mouth 3 (three) times daily. (Patient not taking: Reported on 06/13/2016) 270 capsule 1  . glimepiride (AMARYL) 2 MG tablet Take 2 mg by mouth daily with breakfast. Reported on 06/13/2016     No facility-administered medications prior to visit.    ROS Review of Systems  Constitutional: Negative for fever, chills, fatigue and unexpected weight change.  Eyes: Negative for visual disturbance.  Respiratory: Negative for cough and shortness of breath.   Cardiovascular: Negative for chest pain, palpitations and leg swelling.  Gastrointestinal: Negative for nausea, vomiting, abdominal pain, diarrhea, constipation and blood in stool.  Endocrine: Negative for polydipsia, polyphagia and polyuria.  Musculoskeletal: Negative for myalgias, back pain, arthralgias, gait problem and neck pain.  Skin: Negative for rash.  Allergic/Immunologic: Negative for immunocompromised state.  Neurological: Positive for numbness (in feet ).  Hematological: Negative for adenopathy. Does not bruise/bleed easily.  Psychiatric/Behavioral: Negative for suicidal ideas, sleep disturbance and dysphoric mood. The patient is not nervous/anxious.     Objective:  BP 140/72 mmHg  Pulse 82  Temp(Src) 98.5 F (36.9 C) (Oral)  Ht 5\' 3"  (1.6 m)  Wt 158 lb (71.668 kg)  BMI 28.00 kg/m2  SpO2 92%  BP/Weight 06/13/2016 04/02/2016 [id number]  Systolic BP XX123456 123XX123 0000000  Diastolic BP 72 90 79  Wt. (Lbs) 158 156 159  BMI 28 27.64 28.17    Physical Exam  Constitutional: He appears well-developed and well-nourished. No distress.  HENT:  Head: Normocephalic and  atraumatic.  Neck: Normal range of motion. Neck supple.  Cardiovascular: Normal rate, regular rhythm, normal heart sounds and intact distal pulses.   Pulmonary/Chest: Effort normal and breath sounds normal.  Musculoskeletal: He exhibits edema (trace in LE ).       Feet:  Neurological: He is alert.  Skin: Skin is warm and dry. No rash noted. No erythema.  Psychiatric: He has a normal mood and affect.    Lab Results  Component Value Date   HGBA1C 7.0 06/13/2016   CBG 261  Assessment & Plan:  Joshua [name] was seen today for follow-up and chronic kidney disease.  Diagnoses and all orders for this visit:  CKD (chronic kidney disease) stage 3, GFR 30-59 ml/min -     Cancel: BASIC METABOLIC PANEL WITH GFR; Future -     BASIC METABOLIC PANEL WITH GFR  Type 2 diabetes mellitus with stage 3 chronic kidney disease, with long-term current use of insulin (HCC) -     POCT A1C -     Glucose (CBG) -     Lipid Panel  Type 2 diabetes mellitus with chronic kidney disease, with long-term current use of insulin, unspecified CKD stage (HCC) -     Insulin Glargine (LANTUS SOLOSTAR) 100 UNIT/ML Solostar Pen; Inject 5 Units into the skin daily at 10 pm.  Diabetic neuropathy -     gabapentin (NEURONTIN) 100 MG capsule; Take 1 capsule (100 mg total) by mouth 3 (three) times daily.  Essential hypertension -     hydrALAZINE (APRESOLINE) 50 MG tablet; Take 1 tablet (50 mg total) by mouth 3 (three) times daily. -     losartan (COZAAR) 50 MG tablet; Take 1 tablet (50 mg total) by mouth daily. -     isosorbide mononitrate (IMDUR) 30 MG 24 hr tablet; Take 1 tablet (30 mg total) by mouth daily.  Mild CAD -     aspirin EC 81 MG tablet; Take 1 tablet (81 mg total) by mouth daily. -     Lipid Panel  )   No orders of the defined types were placed in this encounter.    Follow-up: Return in about 3 months (around 09/13/2016) for HTN, DM2, CKD .   [name]

## 2016-06-13 NOTE — Assessment & Plan Note (Signed)
Well controlled with A1c at goal Continue lantus Called pharmacy to d/c Amaryl Rx

## 2016-07-02 MED FILL — ISOSORBIDE MN ER 30 MG TAB: 30 | 30 days supply | Qty: 30 | Fill #0

## 2016-07-02 MED FILL — hydrALAZINE HCL 50 MG TABS: 50 | 30 days supply | Qty: 90 | Fill #0

## 2016-07-02 MED FILL — ULTICARE PEN NDL 4MM 32G: 32G X 4 MM | 25 days supply | Qty: 100 | Fill #0

## 2016-09-16 ENCOUNTER — Encounter: Payer: Self-pay | Admitting: Family Medicine

## 2016-09-16 ENCOUNTER — Ambulatory Visit: Payer: Medicare Other | Attending: Family Medicine | Admitting: Family Medicine

## 2016-09-16 VITALS — BP 148/92 | HR 84 | Temp 98.0°F | Ht 63.0 in | Wt 155.4 lb

## 2016-09-16 DIAGNOSIS — Z87891 Personal history of nicotine dependence: Secondary | ICD-10-CM | POA: Diagnosis not present

## 2016-09-16 DIAGNOSIS — Z79899 Other long term (current) drug therapy: Secondary | ICD-10-CM | POA: Insufficient documentation

## 2016-09-16 DIAGNOSIS — Z7982 Long term (current) use of aspirin: Secondary | ICD-10-CM | POA: Insufficient documentation

## 2016-09-16 DIAGNOSIS — L219 Seborrheic dermatitis, unspecified: Secondary | ICD-10-CM | POA: Insufficient documentation

## 2016-09-16 DIAGNOSIS — E1142 Type 2 diabetes mellitus with diabetic polyneuropathy: Secondary | ICD-10-CM

## 2016-09-16 DIAGNOSIS — N400 Enlarged prostate without lower urinary tract symptoms: Secondary | ICD-10-CM | POA: Diagnosis not present

## 2016-09-16 DIAGNOSIS — N183 Chronic kidney disease, stage 3 unspecified: Secondary | ICD-10-CM

## 2016-09-16 DIAGNOSIS — E1122 Type 2 diabetes mellitus with diabetic chronic kidney disease: Secondary | ICD-10-CM | POA: Diagnosis present

## 2016-09-16 DIAGNOSIS — Z794 Long term (current) use of insulin: Secondary | ICD-10-CM | POA: Diagnosis not present

## 2016-09-16 DIAGNOSIS — Z23 Encounter for immunization: Secondary | ICD-10-CM

## 2016-09-16 DIAGNOSIS — I10 Essential (primary) hypertension: Secondary | ICD-10-CM

## 2016-09-16 LAB — GLUCOSE, POCT (MANUAL RESULT ENTRY): POC Glucose: 252 mg/dl — AB (ref 70–99)

## 2016-09-16 LAB — POCT GLYCOSYLATED HEMOGLOBIN (HGB A1C): Hemoglobin A1C: 7.1

## 2016-09-16 MED ORDER — HYDRALAZINE HCL 50 MG PO TABS
50.0000 mg | ORAL_TABLET | Freq: Three times a day (TID) | ORAL | 3 refills | Status: DC
Start: 2016-09-16 — End: 2016-12-10

## 2016-09-16 MED ORDER — INSULIN GLARGINE 100 UNIT/ML SOLOSTAR PEN: 5.0000 [IU] | PEN_INJECTOR | Freq: Every day | SUBCUTANEOUS | 5 refills | Status: DC

## 2016-09-16 MED ORDER — GABAPENTIN 100 MG PO CAPS: 100.0000 mg | ORAL_CAPSULE | Freq: Three times a day (TID) | ORAL | 1 refills | Status: DC

## 2016-09-16 MED ORDER — TAMSULOSIN HCL 0.4 MG PO CAPS: 0.4000 mg | ORAL_CAPSULE | Freq: Every day | ORAL | 3 refills | Status: DC

## 2016-09-16 MED ORDER — ISOSORBIDE MONONITRATE ER 30 MG PO TB24: 30.0000 mg | ORAL_TABLET | Freq: Every day | ORAL | 3 refills | Status: DC

## 2016-09-16 MED ORDER — SELENIUM SULFIDE 2.25 % EX SHAM: 1.0000 "application " | MEDICATED_SHAMPOO | Freq: Every day | CUTANEOUS | 1 refills | Status: AC

## 2016-09-16 MED ORDER — LOSARTAN POTASSIUM 50 MG PO TABS: 50.0000 mg | ORAL_TABLET | Freq: Every day | ORAL | 3 refills | Status: DC

## 2016-09-16 MED FILL — SELENIUM SULFIDE 2.25% SHAM: 2.25 | 15 days supply | Qty: 180 | Fill #0

## 2016-09-16 MED FILL — GABAPENTIN 100 MG CAPSULE: 100 | 90 days supply | Qty: 270 | Fill #0

## 2016-09-16 MED FILL — LOSARTAN POTASSIUM 50 MG TA: 50 | 90 days supply | Qty: 90 | Fill #0

## 2016-09-16 MED FILL — $LANTUS SOLOSTAR 100 UNITS/: 100 | 90 days supply | Qty: 6 | Fill #0

## 2016-09-16 MED FILL — hydrALAZINE HCL 50 MG TABS: 50 | 90 days supply | Qty: 270 | Fill #0

## 2016-09-16 MED FILL — TAMSULOSIN HCL 0.4 MG CAP: 0.4 | 90 days supply | Qty: 90 | Fill #0

## 2016-09-16 MED FILL — ISOSORBIDE MN ER 30 MG TAB: 30 | 90 days supply | Qty: 90 | Fill #0

## 2016-09-16 NOTE — Patient Instructions (Signed)
Mujahid was seen today for diabetes and hypertension.  Diagnoses and all orders for this visit:  Type 2 diabetes mellitus with chronic kidney disease, with long-term current use of insulin, unspecified CKD stage (HCC) -     POCT glucose (manual entry) -     POCT glycosylated hemoglobin (Hb A1C) -     Insulin Glargine (LANTUS SOLOSTAR) 100 UNIT/ML Solostar Pen; Inject 5 Units into the skin daily at 10 pm. -     Lipid Panel -     COMPLETE METABOLIC PANEL WITH GFR -     Ambulatory referral to Ophthalmology  Essential hypertension -     isosorbide mononitrate (IMDUR) 30 MG 24 hr tablet; Take 1 tablet (30 mg total) by mouth daily. -     losartan (COZAAR) 50 MG tablet; Take 1 tablet (50 mg total) by mouth daily. -     hydrALAZINE (APRESOLINE) 50 MG tablet; Take 1 tablet (50 mg total) by mouth 3 (three) times daily.  Diabetic neuropathy -     gabapentin (NEURONTIN) 100 MG capsule; Take 1 capsule (100 mg total) by mouth 3 (three) times daily.  Benign prostatic hyperplasia, unspecified whether lower urinary tract symptoms present -     tamsulosin (FLOMAX) 0.4 MG CAPS capsule; Take 1 capsule (0.4 mg total) by mouth daily after supper.  Seborrhea -     Selenium Sulfide 2.25 % SHAM; Apply 1 application topically daily. Apply to beard area, let dry then wash out  purchase over the counter reading glasses  F/u in 3 months for HTN and diabetes   Dr. Adrian Blackwater

## 2016-09-16 NOTE — Assessment & Plan Note (Signed)
A: elevated but not severely low Med: compliant P: Continue current regimen meds refilled

## 2016-09-16 NOTE — Assessment & Plan Note (Signed)
Noted on face Selenium sulfide ordered

## 2016-09-16 NOTE — Progress Notes (Signed)
Pt states his having foot pain(both feet).  Pt needs refills on all medications.  Pt is getting flu shot today.  Pt has not had BP medication since august.

## 2016-09-16 NOTE — Progress Notes (Signed)
Subjective:  Patient ID: Joshua [name], male    DOB: [birth year]  Age: 65 y.o. MRN: [id number]  CC: Diabetes and Hypertension   HPI Joshua [name] has diabetes, HTN and CKD stage 3 he presents for    1. CKD:  He denies leg swelling, CP and SOB. He is out of all medications for past 3 weeks per his report.   2. DM2: taking lantus 5 U only. Out of lantus for past 3 weeks. Has neuropathy in feet and is also s/p L transmetarsal amputation. Feels like there is water under his heels. Not taking gabapentin.   3. HTN: taking norvasc, imdur and hydralazine. Also takes daily ASA. No HA, CP or SOB either.   Social History  Substance Use Topics  . Smoking status: Former Smoker    Years: 20.00    Types: Cigarettes    Quit date: 11/25/1992  . Smokeless tobacco: Never Used  . Alcohol use No     Comment: Rarely.    Outpatient Medications Prior to Visit  Medication Sig Dispense Refill  . acetaminophen (TYLENOL 8 HOUR) 650 MG CR tablet Take 1 tablet (650 mg total) by mouth every 8 (eight) hours as needed for pain. (Patient not taking: Reported on 06/13/2016) 90 tablet 2  . aspirin EC 81 MG tablet Take 1 tablet (81 mg total) by mouth daily. 90 tablet 3  . gabapentin (NEURONTIN) 100 MG capsule Take 1 capsule (100 mg total) by mouth 3 (three) times daily. 270 capsule 1  . hydrALAZINE (APRESOLINE) 50 MG tablet Take 1 tablet (50 mg total) by mouth 3 (three) times daily. 270 tablet 3  . Insulin Glargine (LANTUS SOLOSTAR) 100 UNIT/ML Solostar Pen Inject 5 Units into the skin daily at 10 pm. 15 pen 5  . Insulin Pen Needle (ULTICARE MICRO PEN NEEDLES) 32G X 4 MM MISC 1 each by Does not apply route daily. 100 each 3  . isosorbide mononitrate (IMDUR) 30 MG 24 hr tablet Take 1 tablet (30 mg total) by mouth daily. 90 tablet 3  . losartan (COZAAR) 50 MG tablet Take 1 tablet (50 mg total) by mouth daily. 90 tablet 3  . OVER THE COUNTER MEDICATION Take 2 sprays by mouth daily. Reported on 01/02/2016    .  tamsulosin (FLOMAX) 0.4 MG CAPS capsule Take 1 capsule (0.4 mg total) by mouth daily after supper. 30 capsule 5   No facility-administered medications prior to visit.     ROS Review of Systems  Constitutional: Negative for chills, fatigue, fever and unexpected weight change.  Eyes: Positive for visual disturbance.  Respiratory: Negative for cough and shortness of breath.   Cardiovascular: Negative for chest pain, palpitations and leg swelling.  Gastrointestinal: Negative for abdominal pain, blood in stool, constipation, diarrhea, nausea and vomiting.  Endocrine: Negative for polydipsia, polyphagia and polyuria.  Musculoskeletal: Negative for arthralgias, back pain, gait problem, myalgias and neck pain.  Skin: Positive for rash.  Allergic/Immunologic: Negative for immunocompromised state.  Neurological: Positive for numbness (in feet ).  Hematological: Negative for adenopathy. Does not bruise/bleed easily.  Psychiatric/Behavioral: Negative for dysphoric mood, sleep disturbance and suicidal ideas. The patient is not nervous/anxious.     Objective:  BP (!) 148/92 (BP Location: Left Arm, Patient Position: Sitting, Cuff Size: Small)   Pulse 84   Temp 98 F (36.7 C) (Oral)   Ht 5\' 3"  (1.6 m)   Wt 155 lb 6.4 oz (70.5 kg)   SpO2 96%   BMI 27.53 kg/m  BP/Weight 09/16/2016 2/76/1470 07/27/9573  Systolic BP 734 037 096  Diastolic BP 92 72 90  Wt. (Lbs) 155.4 158 156  BMI 27.53 28 27.64    Physical Exam  Constitutional: He appears well-developed and well-nourished. No distress.  HENT:  Head: Normocephalic and atraumatic.  Neck: Normal range of motion. Neck supple.  Cardiovascular: Normal rate, regular rhythm, normal heart sounds and intact distal pulses.   Pulmonary/Chest: Effort normal and breath sounds normal.  Musculoskeletal: He exhibits no edema.       Feet:  Neurological: He is alert.  Skin: Skin is warm and dry. No rash noted. No erythema.     Psychiatric: He has a  normal mood and affect.    Lab Results  Component Value Date   HGBA1C 7.0 06/13/2016   Lab Results  Component Value Date   HGBA1C 7.1 09/16/2016    CBG 252  Assessment & Plan:  Joshua [name] was seen today for diabetes and hypertension.  Diagnoses and all orders for this visit:  Type 2 diabetes mellitus with chronic kidney disease, with long-term current use of insulin, unspecified CKD stage (HCC) -     POCT glucose (manual entry) -     POCT glycosylated hemoglobin (Hb A1C) -     Insulin Glargine (LANTUS SOLOSTAR) 100 UNIT/ML Solostar Pen; Inject 5 Units into the skin daily at 10 pm. -     Lipid Panel -     COMPLETE METABOLIC PANEL WITH GFR -     Ambulatory referral to Ophthalmology  Essential hypertension -     isosorbide mononitrate (IMDUR) 30 MG 24 hr tablet; Take 1 tablet (30 mg total) by mouth daily. -     losartan (COZAAR) 50 MG tablet; Take 1 tablet (50 mg total) by mouth daily. -     hydrALAZINE (APRESOLINE) 50 MG tablet; Take 1 tablet (50 mg total) by mouth 3 (three) times daily.  Diabetic neuropathy -     gabapentin (NEURONTIN) 100 MG capsule; Take 1 capsule (100 mg total) by mouth 3 (three) times daily.  Benign prostatic hyperplasia, unspecified whether lower urinary tract symptoms present -     tamsulosin (FLOMAX) 0.4 MG CAPS capsule; Take 1 capsule (0.4 mg total) by mouth daily after supper.  Seborrhea -     Selenium Sulfide 2.25 % SHAM; Apply 1 application topically daily. Apply to beard area, let dry then wash out    No orders of the defined types were placed in this encounter.   Follow-up: Return in about 3 months (around 12/17/2016) for HTN and DM2 .   [name]

## 2016-09-16 NOTE — Assessment & Plan Note (Signed)
A: diabetes is controlled Med: non compliant x 3 weeks P: Continue lantus 5 mg daily

## 2016-09-17 LAB — LIPID PANEL
Cholesterol: 215 mg/dL — ABNORMAL HIGH (ref 125–200)
HDL: 74 mg/dL (ref >=40)
LDL Cholesterol: 118 mg/dL (ref <130)
TRIGLYCERIDES: 117 mg/dL (ref <150)
Total CHOL/HDL Ratio: 2.9 Ratio (ref <=5.0)
VLDL: 23 mg/dL (ref <30)

## 2016-09-17 LAB — COMPLETE METABOLIC PANEL WITH GFR
ALT: 13 U/L (ref 9–46)
AST: 19 U/L (ref 10–35)
Albumin: 3.2 g/dL — ABNORMAL LOW (ref 3.6–5.1)
Alkaline Phosphatase: 126 U/L — ABNORMAL HIGH (ref 40–115)
BILIRUBIN TOTAL: 0.4 mg/dL (ref 0.2–1.2)
BUN: 34 mg/dL — ABNORMAL HIGH (ref 7–25)
CALCIUM: 9 mg/dL (ref 8.6–10.3)
CHLORIDE: 104 mmol/L (ref 98–110)
CO2: 25 mmol/L (ref 20–31)
CREATININE: 2.35 mg/dL — AB (ref 0.70–1.25)
GFR, EST AFRICAN AMERICAN: 32 mL/min — AB (ref >=60)
GFR, EST NON AFRICAN AMERICAN: 28 mL/min — AB (ref >=60)
Glucose, Bld: 225 mg/dL — ABNORMAL HIGH (ref 65–99)
Potassium: 4.7 mmol/L (ref 3.5–5.3)
Sodium: 139 mmol/L (ref 135–146)
TOTAL PROTEIN: 7 g/dL (ref 6.1–8.1)

## 2016-09-17 NOTE — Assessment & Plan Note (Addendum)
High blood pressure and sugar in setting of med non compliance   It is very important that patient take all medications and not run out to keep blood sugar and pressure well controlled. This is needed to maintain kidney function and avoid kidney failure.   He will need to follow up in 2 weeks for BP check, blood sugar check and repeat blood work BMP with GFR If renal function has not improved, I will obtain renal ultrasound and  refer him to nephrology to help manage his kidney function

## 2016-09-18 ENCOUNTER — Telehealth: Payer: Self-pay

## 2016-09-18 NOTE — Telephone Encounter (Signed)
Pt was called on 10/25 and a VM was left informing pt to return phone call for lab results.

## 2016-09-25 ENCOUNTER — Telehealth: Payer: Self-pay

## 2016-09-25 NOTE — Telephone Encounter (Signed)
Pt was called on 11/01 and no answer or VM was set up to leave message. Pt results will be mailed out on 11/1.

## 2016-10-07 ENCOUNTER — Telehealth: Payer: Self-pay | Admitting: Family Medicine

## 2016-10-07 NOTE — Telephone Encounter (Signed)
Patient called stated that they've been experiencing a cough and stuffy nose and would like advice on what otc medication they can take. Please follow up.

## 2016-10-07 NOTE — Telephone Encounter (Signed)
Please call patient at [phone] because he is out of town.

## 2016-10-08 NOTE — Telephone Encounter (Signed)
Pt was called and informed to get OTC robitussin for his cough.

## 2016-11-20 ENCOUNTER — Telehealth: Payer: Self-pay | Admitting: Family Medicine

## 2016-11-20 NOTE — Telephone Encounter (Signed)
Patient called for advice on OTC medication allegra

## 2016-11-20 NOTE — Telephone Encounter (Signed)
Pt wanted to know if he would be ok to take Allegra, someone got this medication for him which told him to check with the doctor before taking medication for his allergies.

## 2016-11-21 MED ORDER — FEXOFENADINE HCL 180 MG PO TABS: 180.0000 mg | ORAL_TABLET | Freq: Every day | ORAL | Status: AC

## 2016-11-21 NOTE — Telephone Encounter (Signed)
Yes, please call back to patient  he may take allegra Allegra 180 mg daily added to med list

## 2016-11-21 NOTE — Telephone Encounter (Signed)
No answer and unable to leave message at either phone number listed.

## 2016-11-21 NOTE — Telephone Encounter (Signed)
Pt wants call back at [phone]

## 2016-12-10 ENCOUNTER — Encounter: Payer: Self-pay | Admitting: Family Medicine

## 2016-12-10 ENCOUNTER — Ambulatory Visit: Payer: Medicare Other | Attending: Family Medicine | Admitting: Family Medicine

## 2016-12-10 VITALS — BP 114/61 | HR 76 | Temp 97.8°F | Ht 63.0 in | Wt 159.6 lb

## 2016-12-10 DIAGNOSIS — N183 Chronic kidney disease, stage 3 unspecified: Secondary | ICD-10-CM

## 2016-12-10 DIAGNOSIS — Z794 Long term (current) use of insulin: Secondary | ICD-10-CM | POA: Diagnosis not present

## 2016-12-10 DIAGNOSIS — N400 Enlarged prostate without lower urinary tract symptoms: Secondary | ICD-10-CM | POA: Insufficient documentation

## 2016-12-10 DIAGNOSIS — M545 Low back pain, unspecified: Secondary | ICD-10-CM

## 2016-12-10 DIAGNOSIS — I2510 Atherosclerotic heart disease of native coronary artery without angina pectoris: Secondary | ICD-10-CM | POA: Insufficient documentation

## 2016-12-10 DIAGNOSIS — I129 Hypertensive chronic kidney disease with stage 1 through stage 4 chronic kidney disease, or unspecified chronic kidney disease: Secondary | ICD-10-CM | POA: Insufficient documentation

## 2016-12-10 DIAGNOSIS — E1122 Type 2 diabetes mellitus with diabetic chronic kidney disease: Secondary | ICD-10-CM | POA: Diagnosis not present

## 2016-12-10 DIAGNOSIS — I10 Essential (primary) hypertension: Secondary | ICD-10-CM

## 2016-12-10 DIAGNOSIS — Z87891 Personal history of nicotine dependence: Secondary | ICD-10-CM | POA: Insufficient documentation

## 2016-12-10 DIAGNOSIS — E1140 Type 2 diabetes mellitus with diabetic neuropathy, unspecified: Secondary | ICD-10-CM | POA: Insufficient documentation

## 2016-12-10 DIAGNOSIS — Z7982 Long term (current) use of aspirin: Secondary | ICD-10-CM | POA: Insufficient documentation

## 2016-12-10 DIAGNOSIS — Z23 Encounter for immunization: Secondary | ICD-10-CM

## 2016-12-10 DIAGNOSIS — Z79899 Other long term (current) drug therapy: Secondary | ICD-10-CM | POA: Insufficient documentation

## 2016-12-10 DIAGNOSIS — E1142 Type 2 diabetes mellitus with diabetic polyneuropathy: Secondary | ICD-10-CM

## 2016-12-10 DIAGNOSIS — B351 Tinea unguium: Secondary | ICD-10-CM | POA: Insufficient documentation

## 2016-12-10 LAB — BASIC METABOLIC PANEL WITH GFR
BUN: 50 mg/dL — ABNORMAL HIGH (ref 7–25)
CALCIUM: 8.6 mg/dL (ref 8.6–10.3)
CO2: 24 mmol/L (ref 20–31)
CREATININE: 2.78 mg/dL — AB (ref 0.70–1.25)
Chloride: 107 mmol/L (ref 98–110)
GFR, Est African American: 26 mL/min — ABNORMAL LOW (ref >=60)
GFR, Est Non African American: 23 mL/min — ABNORMAL LOW (ref >=60)
GLUCOSE: 92 mg/dL (ref 65–99)
Potassium: 5.1 mmol/L (ref 3.5–5.3)
Sodium: 138 mmol/L (ref 135–146)

## 2016-12-10 LAB — POCT GLYCOSYLATED HEMOGLOBIN (HGB A1C): HEMOGLOBIN A1C: 6.7

## 2016-12-10 LAB — GLUCOSE, POCT (MANUAL RESULT ENTRY): POC Glucose: 147 mg/dl — AB (ref 70–99)

## 2016-12-10 MED ORDER — TERBINAFINE HCL 250 MG PO TABS: 250.0000 mg | ORAL_TABLET | Freq: Every day | ORAL | 2 refills | Status: DC

## 2016-12-10 MED ORDER — TAMSULOSIN HCL 0.4 MG PO CAPS: 0.4000 mg | ORAL_CAPSULE | Freq: Every day | ORAL | 3 refills | Status: DC

## 2016-12-10 MED ORDER — INSULIN GLARGINE 100 UNIT/ML SOLOSTAR PEN: 5.0000 [IU] | PEN_INJECTOR | Freq: Every day | SUBCUTANEOUS | 11 refills | Status: DC

## 2016-12-10 MED ORDER — LOSARTAN POTASSIUM 50 MG PO TABS: 50.0000 mg | ORAL_TABLET | Freq: Every day | ORAL | 3 refills | Status: DC

## 2016-12-10 MED ORDER — HYDRALAZINE HCL 50 MG PO TABS: 50.0000 mg | ORAL_TABLET | Freq: Three times a day (TID) | ORAL | 3 refills | Status: DC

## 2016-12-10 MED ORDER — ACETAMINOPHEN ER 650 MG PO TBCR: 650.0000 mg | EXTENDED_RELEASE_TABLET | Freq: Three times a day (TID) | ORAL | 2 refills | Status: AC | PRN

## 2016-12-10 MED ORDER — ISOSORBIDE MONONITRATE ER 30 MG PO TB24: 30.0000 mg | ORAL_TABLET | Freq: Every day | ORAL | 3 refills | Status: DC

## 2016-12-10 MED ORDER — GABAPENTIN 100 MG PO CAPS: 100.0000 mg | ORAL_CAPSULE | Freq: Three times a day (TID) | ORAL | 1 refills | Status: DC

## 2016-12-10 MED ORDER — ASPIRIN EC 81 MG PO TBEC: 81.0000 mg | DELAYED_RELEASE_TABLET | Freq: Every day | ORAL | 3 refills | Status: DC

## 2016-12-10 MED FILL — !LANTUS 100 UNITS/ML VIAL: 100 | 200 days supply | Qty: 10 | Fill #0

## 2016-12-10 MED FILL — hydrALAZINE HCL 50 MG TABS: 50 | 30 days supply | Qty: 90 | Fill #0

## 2016-12-10 MED FILL — ?LOSARTAN POTASSIUM 50 MG T: 50 MG | 30 days supply | Qty: 30 | Fill #0

## 2016-12-10 MED FILL — ?GABAPENTIN 100 MG CAP: 30 days supply | Qty: 90 | Fill #0

## 2016-12-10 MED FILL — ISOSORBIDE MN ER 30 MG TAB: 30 | 30 days supply | Qty: 30 | Fill #0

## 2016-12-10 MED FILL — TERBINAFINE HCL 250 MG TAB: 250 | 30 days supply | Qty: 30 | Fill #0

## 2016-12-10 MED FILL — TAMSULOSIN HCL 0.4 MG CAP: 0.4 | 30 days supply | Qty: 30 | Fill #0

## 2016-12-10 NOTE — Assessment & Plan Note (Signed)
A1c at goal Continue current regimen He is not monitoring CBGs Teaching done

## 2016-12-10 NOTE — Assessment & Plan Note (Signed)
Well-controlled.  Continue current regimen. 

## 2016-12-10 NOTE — Progress Notes (Signed)
Subjective:  Patient ID: Joshua [name], male    DOB: Nov 07, 1951  Age: 66 y.o. MRN: [id number]  CC: Hypertension and Diabetes   HPI Joshua [name] has diabetes, HTN and CKD stage 3 he presents for    1. CKD:  He denies leg swelling, CP and SOB. He is out of all medications for past 3 weeks per his report.   2. DM2: taking lantus 5 U daily.  Has neuropathy in feet and is also s/p L transmetarsal amputation. Feels like there is water under his heels. Not taking gabapentin.   3. HTN: taking norvasc, imdur and hydralazine. Also takes daily ASA. No HA, CP or SOB either.   Social History  Substance Use Topics  . Smoking status: Former Smoker    Years: 20.00    Types: Cigarettes    Quit date: 11/25/1992  . Smokeless tobacco: Never Used  . Alcohol use No     Comment: Rarely.    Outpatient Medications Prior to Visit  Medication Sig Dispense Refill  . aspirin EC 81 MG tablet Take 1 tablet (81 mg total) by mouth daily. 90 tablet 3  . fexofenadine (ALLEGRA ALLERGY) 180 MG tablet Take 1 tablet (180 mg total) by mouth daily.    Marland Kitchen gabapentin (NEURONTIN) 100 MG capsule Take 1 capsule (100 mg total) by mouth 3 (three) times daily. 270 capsule 1  . hydrALAZINE (APRESOLINE) 50 MG tablet Take 1 tablet (50 mg total) by mouth 3 (three) times daily. 270 tablet 3  . Insulin Glargine (LANTUS SOLOSTAR) 100 UNIT/ML Solostar Pen Inject 5 Units into the skin daily at 10 pm. 15 pen 5  . Insulin Pen Needle (ULTICARE MICRO PEN NEEDLES) 32G X 4 MM MISC 1 each by Does not apply route daily. 100 each 3  . isosorbide mononitrate (IMDUR) 30 MG 24 hr tablet Take 1 tablet (30 mg total) by mouth daily. 90 tablet 3  . losartan (COZAAR) 50 MG tablet Take 1 tablet (50 mg total) by mouth daily. 90 tablet 3  . OVER THE COUNTER MEDICATION Take 2 sprays by mouth daily. Reported on 01/02/2016    . Selenium Sulfide 2.25 % SHAM Apply 1 application topically daily. Apply to beard area, let dry then wash out 180 mL 1  .  tamsulosin (FLOMAX) 0.4 MG CAPS capsule Take 1 capsule (0.4 mg total) by mouth daily after supper. 90 capsule 3  . acetaminophen (TYLENOL 8 HOUR) 650 MG CR tablet Take 1 tablet (650 mg total) by mouth every 8 (eight) hours as needed for pain. (Patient not taking: Reported on 12/10/2016) 90 tablet 2   No facility-administered medications prior to visit.     ROS Review of Systems  Constitutional: Negative for chills, fatigue, fever and unexpected weight change.  Eyes: Positive for visual disturbance.  Respiratory: Negative for cough and shortness of breath.   Cardiovascular: Negative for chest pain, palpitations and leg swelling.  Gastrointestinal: Negative for abdominal pain, blood in stool, constipation, diarrhea, nausea and vomiting.  Endocrine: Negative for polydipsia, polyphagia and polyuria.  Musculoskeletal: Negative for arthralgias, back pain, gait problem, myalgias and neck pain.  Skin: Negative for rash.  Allergic/Immunologic: Negative for immunocompromised state.  Neurological: Positive for numbness (in feet ).  Hematological: Negative for adenopathy. Does not bruise/bleed easily.  Psychiatric/Behavioral: Negative for dysphoric mood, sleep disturbance and suicidal ideas. The patient is not nervous/anxious.     Objective:  BP 114/61 (BP Location: Left Arm, Patient Position: Sitting, Cuff Size: Small)  Pulse 76   Temp 97.8 F (36.6 C) (Oral)   Ht 5\' 3"  (1.6 m)   Wt 159 lb 9.6 oz (72.4 kg)   SpO2 95%   BMI 28.27 kg/m   BP/Weight 12/10/2016 09/16/2016 5/99/7741  Systolic BP 423 953 202  Diastolic BP 61 92 72  Wt. (Lbs) 159.6 155.4 158  BMI 28.27 27.53 28    Physical Exam  Constitutional: He appears well-developed and well-nourished. No distress.  HENT:  Head: Normocephalic and atraumatic.  Neck: Normal range of motion. Neck supple.  Cardiovascular: Normal rate, regular rhythm, normal heart sounds and intact distal pulses.   Pulmonary/Chest: Effort normal and breath  sounds normal.  Musculoskeletal: He exhibits no edema.       Feet:  Neurological: He is alert.  Skin: Skin is warm and dry. No rash noted. No erythema.  Psychiatric: He has a normal mood and affect.    Lab Results  Component Value Date   HGBA1C 7.1 09/16/2016   Lab Results  Component Value Date   HGBA1C 6.7 12/10/2016    CBG 147 Assessment & Plan:  Joshua [name] was seen today for hypertension and diabetes.  Diagnoses and all orders for this visit:  Type 2 diabetes mellitus with chronic kidney disease, with long-term current use of insulin, unspecified CKD stage (HCC) -     POCT glucose (manual entry) -     POCT glycosylated hemoglobin (Hb A1C) -     Insulin Glargine (LANTUS SOLOSTAR) 100 UNIT/ML Solostar Pen; Inject 5 Units into the skin daily at 10 pm.  CKD (chronic kidney disease) stage 3, GFR 30-59 ml/min -     BASIC METABOLIC PANEL WITH GFR  Acute low back pain without sciatica, unspecified back pain laterality -     acetaminophen (TYLENOL 8 HOUR) 650 MG CR tablet; Take 1 tablet (650 mg total) by mouth every 8 (eight) hours as needed for pain.  Mild CAD -     aspirin EC 81 MG tablet; Take 1 tablet (81 mg total) by mouth daily.  Benign prostatic hyperplasia, unspecified whether lower urinary tract symptoms present -     tamsulosin (FLOMAX) 0.4 MG CAPS capsule; Take 1 capsule (0.4 mg total) by mouth daily after supper.  Essential hypertension -     losartan (COZAAR) 50 MG tablet; Take 1 tablet (50 mg total) by mouth daily. -     isosorbide mononitrate (IMDUR) 30 MG 24 hr tablet; Take 1 tablet (30 mg total) by mouth daily. -     hydrALAZINE (APRESOLINE) 50 MG tablet; Take 1 tablet (50 mg total) by mouth 3 (three) times daily.  Diabetic neuropathy -     gabapentin (NEURONTIN) 100 MG capsule; Take 1 capsule (100 mg total) by mouth 3 (three) times daily.  Onychomycosis of right great toe -     terbinafine (LAMISIL) 250 MG tablet; Take 1 tablet (250 mg total) by mouth  daily.  Other orders -     Pneumococcal polysaccharide vaccine 23-valent greater than or equal to 2yo subcutaneous/IM    No orders of the defined types were placed in this encounter.   Follow-up: No Follow-up on file.   [name]

## 2016-12-10 NOTE — Patient Instructions (Addendum)
Joshua [name] was seen today for hypertension and diabetes.  Diagnoses and all orders for this visit:  Type 2 diabetes mellitus with chronic kidney disease, with long-term current use of insulin, unspecified CKD stage (HCC) -     POCT glucose (manual entry) -     POCT glycosylated hemoglobin (Hb A1C) -     Insulin Glargine (LANTUS SOLOSTAR) 100 UNIT/ML Solostar Pen; Inject 5 Units into the skin daily at 10 pm.  CKD (chronic kidney disease) stage 3, GFR 30-59 ml/min -     BASIC METABOLIC PANEL WITH GFR   F/u in 3 months for diabetes and HTN  Dr. Adrian Blackwater

## 2016-12-13 ENCOUNTER — Ambulatory Visit: Payer: Medicare Other | Admitting: Family Medicine

## 2016-12-17 ENCOUNTER — Telehealth: Payer: Self-pay

## 2016-12-17 NOTE — Telephone Encounter (Signed)
Pt was called and informed of lab results. 

## 2017-01-24 MED FILL — !LANTUS SOLOSTAR 100UNITS/M: 100 | 28 days supply | Qty: 3 | Fill #1

## 2017-01-24 MED FILL — hydrALAZINE HCL 50 MG TABS: 50 | 30 days supply | Qty: 90 | Fill #1

## 2017-01-24 MED FILL — ?TAMSULOSIN HCL 0.4 MG CAP: 0.4 | 30 days supply | Qty: 30 | Fill #1

## 2017-01-24 MED FILL — TRUEPLUS PEN NDL 32GX5/32: 32G X 4 MM | 25 days supply | Qty: 100 | Fill #1

## 2017-01-24 MED FILL — TRUEPLUS PEN NDL 32GX5/32": 32G X 4 MM | 25 days supply | Qty: 100 | Fill #1

## 2017-01-24 MED FILL — ?GABAPENTIN 100 MG CAP: 30 days supply | Qty: 90 | Fill #1

## 2017-01-24 MED FILL — TERBINAFINE HCL 250 MG TAB: 250 | 30 days supply | Qty: 30 | Fill #1

## 2017-01-24 MED FILL — ?LOSARTAN POTASSIUM 50 MG T: 50 MG | 30 days supply | Qty: 30 | Fill #1

## 2017-01-24 MED FILL — ISOSORBIDE MN ER 30 MG TAB: 30 | 30 days supply | Qty: 30 | Fill #1

## 2017-01-27 ENCOUNTER — Other Ambulatory Visit: Payer: Self-pay

## 2017-01-27 DIAGNOSIS — E1122 Type 2 diabetes mellitus with diabetic chronic kidney disease: Secondary | ICD-10-CM

## 2017-01-27 DIAGNOSIS — Z794 Long term (current) use of insulin: Principal | ICD-10-CM

## 2017-01-27 MED ORDER — INSULIN GLARGINE 100 UNIT/ML SOLOSTAR PEN: 5.0000 [IU] | PEN_INJECTOR | Freq: Every day | SUBCUTANEOUS | 3 refills | Status: DC

## 2017-02-19 MED FILL — TERBINAFINE HCL 250 MG TAB: 250 | 30 days supply | Qty: 30 | Fill #2

## 2017-02-24 MED FILL — ISOSORBIDE MN ER 30 MG TAB: 30 | 30 days supply | Qty: 30 | Fill #2

## 2017-02-24 MED FILL — hydrALAZINE HCL 50 MG TABS: 50 | 30 days supply | Qty: 90 | Fill #2

## 2017-02-24 MED FILL — ?LOSARTAN POTASSIUM 50 MG T: 50 MG | 30 days supply | Qty: 30 | Fill #2

## 2017-02-24 MED FILL — TAMSULOSIN HCL 0.4 MG CAP: 0.4 | 30 days supply | Qty: 30 | Fill #2

## 2017-02-24 MED FILL — $LANTUS SOLOSTAR 100 UNITS/: 100 | 28 days supply | Qty: 3 | Fill #2

## 2017-02-24 MED FILL — ?GABAPENTIN 100 MG CAP: 30 days supply | Qty: 90 | Fill #2

## 2017-03-11 ENCOUNTER — Ambulatory Visit: Payer: Medicare Other | Admitting: Family Medicine

## 2017-03-24 ENCOUNTER — Encounter: Payer: Self-pay | Admitting: Family Medicine

## 2017-03-24 ENCOUNTER — Ambulatory Visit: Payer: Self-pay | Attending: Family Medicine | Admitting: Family Medicine

## 2017-03-24 VITALS — BP 130/70 | HR 79 | Temp 97.8°F | Ht 63.0 in | Wt 161.8 lb

## 2017-03-24 DIAGNOSIS — I2510 Atherosclerotic heart disease of native coronary artery without angina pectoris: Secondary | ICD-10-CM

## 2017-03-24 DIAGNOSIS — I10 Essential (primary) hypertension: Secondary | ICD-10-CM

## 2017-03-24 DIAGNOSIS — Z89432 Acquired absence of left foot: Secondary | ICD-10-CM

## 2017-03-24 DIAGNOSIS — Z87891 Personal history of nicotine dependence: Secondary | ICD-10-CM | POA: Insufficient documentation

## 2017-03-24 DIAGNOSIS — E1140 Type 2 diabetes mellitus with diabetic neuropathy, unspecified: Secondary | ICD-10-CM | POA: Insufficient documentation

## 2017-03-24 DIAGNOSIS — B351 Tinea unguium: Secondary | ICD-10-CM | POA: Insufficient documentation

## 2017-03-24 DIAGNOSIS — N183 Chronic kidney disease, stage 3 unspecified: Secondary | ICD-10-CM

## 2017-03-24 DIAGNOSIS — E875 Hyperkalemia: Secondary | ICD-10-CM

## 2017-03-24 DIAGNOSIS — E1122 Type 2 diabetes mellitus with diabetic chronic kidney disease: Secondary | ICD-10-CM | POA: Insufficient documentation

## 2017-03-24 DIAGNOSIS — D649 Anemia, unspecified: Secondary | ICD-10-CM | POA: Insufficient documentation

## 2017-03-24 DIAGNOSIS — Z79899 Other long term (current) drug therapy: Secondary | ICD-10-CM | POA: Insufficient documentation

## 2017-03-24 DIAGNOSIS — Z794 Long term (current) use of insulin: Secondary | ICD-10-CM | POA: Insufficient documentation

## 2017-03-24 DIAGNOSIS — E1142 Type 2 diabetes mellitus with diabetic polyneuropathy: Secondary | ICD-10-CM

## 2017-03-24 DIAGNOSIS — Z0000 Encounter for general adult medical examination without abnormal findings: Secondary | ICD-10-CM

## 2017-03-24 DIAGNOSIS — Z7982 Long term (current) use of aspirin: Secondary | ICD-10-CM | POA: Insufficient documentation

## 2017-03-24 DIAGNOSIS — N400 Enlarged prostate without lower urinary tract symptoms: Secondary | ICD-10-CM | POA: Insufficient documentation

## 2017-03-24 DIAGNOSIS — I129 Hypertensive chronic kidney disease with stage 1 through stage 4 chronic kidney disease, or unspecified chronic kidney disease: Secondary | ICD-10-CM | POA: Insufficient documentation

## 2017-03-24 LAB — GLUCOSE, POCT (MANUAL RESULT ENTRY): POC Glucose: 129 mg/dl — AB (ref 70–99)

## 2017-03-24 LAB — POCT GLYCOSYLATED HEMOGLOBIN (HGB A1C): Hemoglobin A1C: 6.3

## 2017-03-24 MED ORDER — GABAPENTIN 100 MG PO CAPS: 100.0000 mg | ORAL_CAPSULE | Freq: Three times a day (TID) | ORAL | 1 refills | Status: DC

## 2017-03-24 MED ORDER — TURMERIC 500 MG PO CAPS: 500.0000 mg | ORAL_CAPSULE | Freq: Every day | ORAL | Status: AC

## 2017-03-24 MED ORDER — ISOSORBIDE MONONITRATE ER 30 MG PO TB24: 30.0000 mg | ORAL_TABLET | Freq: Every day | ORAL | 3 refills | Status: DC

## 2017-03-24 MED ORDER — HYDRALAZINE HCL 50 MG PO TABS: 50.0000 mg | ORAL_TABLET | Freq: Three times a day (TID) | ORAL | 3 refills | Status: DC

## 2017-03-24 MED ORDER — TAMSULOSIN HCL 0.4 MG PO CAPS: 0.4000 mg | ORAL_CAPSULE | Freq: Every day | ORAL | 3 refills | Status: DC

## 2017-03-24 MED ORDER — LOSARTAN POTASSIUM 50 MG PO TABS: 50.0000 mg | ORAL_TABLET | Freq: Every day | ORAL | 3 refills | Status: DC

## 2017-03-24 MED ORDER — INSULIN GLARGINE 100 UNIT/ML SOLOSTAR PEN: 5.0000 [IU] | PEN_INJECTOR | Freq: Every day | SUBCUTANEOUS | 3 refills | Status: DC

## 2017-03-24 MED ORDER — TERBINAFINE HCL 250 MG PO TABS: 250.0000 mg | ORAL_TABLET | Freq: Every day | ORAL | 0 refills | Status: DC

## 2017-03-24 MED ORDER — GLUCOSAMINE-CHONDROITIN 750-600 MG PO TABS: 2.0000 | ORAL_TABLET | Freq: Every day | ORAL | 0 refills | Status: AC

## 2017-03-24 NOTE — Patient Instructions (Addendum)
Joshua [name] was seen today for diabetes and hypertension.  Diagnoses and all orders for this visit:  Type 2 diabetes mellitus with chronic kidney disease, with long-term current use of insulin, unspecified CKD stage (HCC) -     POCT glucose (manual entry) -     POCT glycosylated hemoglobin (Hb A1C) -     Ambulatory referral to Ophthalmology  Essential hypertension -     hydrALAZINE (APRESOLINE) 50 MG tablet; Take 1 tablet (50 mg total) by mouth 3 (three) times daily. -     isosorbide mononitrate (IMDUR) 30 MG 24 hr tablet; Take 1 tablet (30 mg total) by mouth daily. -     losartan (COZAAR) 50 MG tablet; Take 1 tablet (50 mg total) by mouth daily.  CKD (chronic kidney disease) stage 3, GFR 30-59 ml/min -     CMP14+EGFR  Anemia, unspecified type -     CBC -     Iron and TIBC -     Ferritin  Healthcare maintenance -     Ambulatory referral to Gastroenterology  Benign prostatic hyperplasia, unspecified whether lower urinary tract symptoms present -     tamsulosin (FLOMAX) 0.4 MG CAPS capsule; Take 1 capsule (0.4 mg total) by mouth daily after supper.  Diabetic neuropathy -     gabapentin (NEURONTIN) 100 MG capsule; Take 1 capsule (100 mg total) by mouth 3 (three) times daily.  Onychomycosis of right great toe -     terbinafine (LAMISIL) 250 MG tablet; Take 1 tablet (250 mg total) by mouth daily.  Other orders -     Turmeric 500 MG CAPS; Take 500 mg by mouth daily. -     Glucosamine-Chondroitin 750-600 MG TABS; Take 2 tablets by mouth daily.  I called revitive it is a machine that is marketed at boosting circulation It is not covered by insurance so the entire cost of out of pocket I recommend a daily walking program to improve circulations I have also placed a referral to [facility] about getting a  Post-amputation shoe for your L foot   For kidney health  Avoid salt in diet, Avoid ibuprofen, aleve, BCs and Goody Powders  f/u in 3 months for HTN sooner if needed  Dr.  Adrian Blackwater

## 2017-03-24 NOTE — Assessment & Plan Note (Signed)
A: normotensive Med: compliant P: Continue losartan 50 mg daily, hydralazine 50 mg TID and imdur 30 mg daily  Check CMP patient

## 2017-03-24 NOTE — Assessment & Plan Note (Signed)
Well controlled on lantus 5 U daily Continue lantus

## 2017-03-24 NOTE — Progress Notes (Addendum)
Subjective:  Patient ID: Joshua [name], male    DOB: 22-Sep-1951  Age: 66 y.o. 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 CKD:  He denies leg swelling, CP. He reports shortness of breath with moderate exertion.    2. DM2: taking lantus 5 U daily.  Has neuropathy in feet and is also s/p L transmetarsal amputation. Feels like there is water under his heels. He also has ankle stiffness and cracking when walking.   3. HTN: taking losartan, imdur and hydralazine. Also takes daily ASA. No HA, CP.   Past Surgical History:  Procedure Laterality Date  . I&D EXTREMITY Left 01/17/2014   Procedure: IRRIGATION AND DEBRIDEMENT  LEFT FOOT;  Surgeon: Wylene Simmer, MD;  Location: [place];  Service: Orthopedics;  Laterality: Left;  . KNEE ARTHROSCOPY  2010   3 times, right knee  . LEFT HEART CATHETERIZATION WITH CORONARY ANGIOGRAM N/A 02/02/2014   Procedure: LEFT HEART CATHETERIZATION WITH CORONARY ANGIOGRAM;  Surgeon: Burnell Blanks, MD;  Location: [facility] C[facility];  Service: Cardiovascular;  Laterality: N/A;  . TRANSMETATARSAL AMPUTATION Left 07/14/2014   Procedure: TRANSMETATARSAL AMPUTATION;  Surgeon: Wylene Simmer, MD;  Location: [place];  Service: Orthopedics;  Laterality: Left;  percutaneous achilles tendon lengthening    Social History  Substance Use Topics  . Smoking status: Former Smoker    Years: 20.00    Types: Cigarettes    Quit date: 11/25/1992  . Smokeless tobacco: Never Used  . Alcohol use No     Comment: Rarely.    Outpatient Medications Prior to Visit  Medication Sig Dispense Refill  . acetaminophen (TYLENOL 8 HOUR) 650 MG CR tablet Take 1 tablet (650 mg total) by mouth every 8 (eight) hours as needed for pain. 90 tablet 2  . aspirin EC 81 MG tablet Take 1 tablet (81 mg total) by mouth daily. 90 tablet 3  . fexofenadine (ALLEGRA ALLERGY) 180 MG tablet  Take 1 tablet (180 mg total) by mouth daily.    Marland Kitchen gabapentin (NEURONTIN) 100 MG capsule Take 1 capsule (100 mg total) by mouth 3 (three) times daily. 270 capsule 1  . hydrALAZINE (APRESOLINE) 50 MG tablet Take 1 tablet (50 mg total) by mouth 3 (three) times daily. 270 tablet 3  . Insulin Glargine (LANTUS SOLOSTAR) 100 UNIT/ML Solostar Pen Inject 5 Units into the skin daily at 10 pm. 45 mL 3  . Insulin Pen Needle (ULTICARE MICRO PEN NEEDLES) 32G X 4 MM MISC 1 each by Does not apply route daily. 100 each 3  . isosorbide mononitrate (IMDUR) 30 MG 24 hr tablet Take 1 tablet (30 mg total) by mouth daily. 90 tablet 3  . losartan (COZAAR) 50 MG tablet Take 1 tablet (50 mg total) by mouth daily. 90 tablet 3  . OVER THE COUNTER MEDICATION Take 2 sprays by mouth daily. Reported on 01/02/2016    . Selenium Sulfide 2.25 % SHAM Apply 1 application topically daily. Apply to beard area, let dry then wash out 180 mL 1  . tamsulosin (FLOMAX) 0.4 MG CAPS capsule Take 1 capsule (0.4 mg total) by mouth daily after supper. 90 capsule 3  . terbinafine (LAMISIL) 250 MG tablet Take 1 tablet (250 mg total) by mouth daily. 30 tablet 2   No facility-administered medications prior to visit.     ROS Review of Systems  Constitutional: Negative for chills, fatigue, fever and unexpected weight change.  Eyes: Negative for visual disturbance.  Respiratory: Positive for shortness of breath. Negative for cough.   Cardiovascular: Negative for chest pain, palpitations and leg swelling.  Gastrointestinal: Negative for abdominal pain, blood in stool, constipation, diarrhea, nausea and vomiting.  Endocrine: Negative for polydipsia, polyphagia and polyuria.  Musculoskeletal: Negative for arthralgias, back pain, gait problem, myalgias and neck pain.  Skin: Negative for rash.  Allergic/Immunologic: Negative for immunocompromised state.  Neurological: Positive for numbness (in feet ).  Hematological: Negative for adenopathy. Does not  bruise/bleed easily.  Psychiatric/Behavioral: Negative for dysphoric mood, sleep disturbance and suicidal ideas. The patient is not nervous/anxious.     Objective:  BP 130/70   Pulse 79   Temp 97.8 F (36.6 C) (Oral)   Ht _0  (1.6 m)   Wt 161 lb 12.8 oz (73.4 kg)   SpO2 97%   BMI 28.66 kg/m   BP/Weight 03/24/2017 12/10/2016 94/76/5465  Systolic BP 035 465 681  Diastolic BP 70 61 92  Wt. (Lbs) 161.8 159.6 155.4  BMI 28.66 28.27 27.53    Physical Exam  Constitutional: He appears well-developed and well-nourished. No distress.  HENT:  Head: Normocephalic and atraumatic.  Neck: Normal range of motion. Neck supple.  Cardiovascular: Normal rate, regular rhythm, normal heart sounds and intact distal pulses.   Pulmonary/Chest: Effort normal and breath sounds normal.  Musculoskeletal: He exhibits no edema.       Feet:  Neurological: He is alert.  Skin: Skin is warm and dry. No rash noted. No erythema.  Psychiatric: He has a normal mood and affect.    Lab Results  Component Value Date   HGBA1C 6.7 12/10/2016   Lab Results  Component Value Date   HGBA1C 6.3 03/24/2017    CBG 129  Assessment & Plan:  Keean was seen today for diabetes and hypertension.  Diagnoses and all orders for this visit:  Type 2 diabetes mellitus with chronic kidney disease, with long-term current use of insulin, unspecified CKD stage (HCC) -     POCT glucose (manual entry) -     POCT glycosylated hemoglobin (Hb A1C) -     Ambulatory referral to Ophthalmology  Essential hypertension -     hydrALAZINE (APRESOLINE) 50 MG tablet; Take 1 tablet (50 mg total) by mouth 3 (three) times daily. -     isosorbide mononitrate (IMDUR) 30 MG 24 hr tablet; Take 1 tablet (30 mg total) by mouth daily. -     losartan (COZAAR) 50 MG tablet; Take 1 tablet (50 mg total) by mouth daily.  CKD (chronic kidney disease) stage 3, GFR 30-59 ml/min -     CMP14+EGFR  Anemia, unspecified type -     CBC -     Iron and  TIBC -     Ferritin  Healthcare maintenance -     Ambulatory referral to Gastroenterology  Benign prostatic hyperplasia, unspecified whether lower urinary tract symptoms present -     tamsulosin (FLOMAX) 0.4 MG CAPS capsule; Take 1 capsule (0.4 mg total) by mouth daily after supper.  Diabetic neuropathy -     gabapentin (NEURONTIN) 100 MG capsule; Take 1 capsule (100 mg total) by mouth 3 (three) times daily.  Onychomycosis of right great toe -     terbinafine (LAMISIL) 250 MG tablet; Take 1 tablet (250 mg total) by mouth daily.  Other orders -     Turmeric 500 MG CAPS; Take 500 mg by mouth  daily. -     Glucosamine-Chondroitin 750-600 MG TABS; Take 2 tablets by mouth daily.    No orders of the defined types were placed in this encounter.   Follow-up: Return in about 3 months (around 06/23/2017) for HTN.   [name]

## 2017-03-24 NOTE — Assessment & Plan Note (Signed)
s/p amputation with neuropathy and gait disturbance Referral to [facility] for post amputation shoe to promote an exercise program

## 2017-03-25 DIAGNOSIS — E875 Hyperkalemia: Secondary | ICD-10-CM | POA: Insufficient documentation

## 2017-03-25 LAB — CMP14+EGFR
ALBUMIN: 3.6 g/dL (ref 3.6–4.8)
ALK PHOS: 93 IU/L (ref 39–117)
ALT: 22 IU/L (ref 0–44)
AST: 25 IU/L (ref 0–40)
Albumin/Globulin Ratio: 1.1 — ABNORMAL LOW (ref 1.2–2.2)
BUN / CREAT RATIO: 17 (ref 10–24)
BUN: 45 mg/dL — ABNORMAL HIGH (ref 8–27)
Bilirubin Total: 0.2 mg/dL (ref 0.0–1.2)
CHLORIDE: 105 mmol/L (ref 96–106)
CO2: 22 mmol/L (ref 18–29)
Calcium: 8.9 mg/dL (ref 8.6–10.2)
Creatinine, Ser: 2.59 mg/dL — ABNORMAL HIGH (ref 0.76–1.27)
GFR calc Af Amer: 29 mL/min/{1.73_m2} — ABNORMAL LOW (ref >59)
GFR calc non Af Amer: 25 mL/min/{1.73_m2} — ABNORMAL LOW (ref >59)
GLUCOSE: 105 mg/dL — AB (ref 65–99)
Globulin, Total: 3.4 g/dL (ref 1.5–4.5)
POTASSIUM: 5.9 mmol/L — AB (ref 3.5–5.2)
SODIUM: 142 mmol/L (ref 134–144)
Total Protein: 7 g/dL (ref 6.0–8.5)

## 2017-03-25 LAB — CBC
Hematocrit: 36.4 % — ABNORMAL LOW (ref 37.5–51.0)
Hemoglobin: 11.3 g/dL — ABNORMAL LOW (ref 13.0–17.7)
MCH: 26.7 pg (ref 26.6–33.0)
MCHC: 31 g/dL — ABNORMAL LOW (ref 31.5–35.7)
MCV: 86 fL (ref 79–97)
PLATELETS: 263 10*3/uL (ref 150–379)
RBC: 4.23 x10E6/uL (ref 4.14–5.80)
RDW: 17.3 % — AB (ref 12.3–15.4)
WBC: 5.5 10*3/uL (ref 3.4–10.8)

## 2017-03-25 LAB — FERRITIN: Ferritin: 86 ng/mL (ref 30–400)

## 2017-03-25 LAB — IRON AND TIBC
IRON SATURATION: 26 % (ref 15–55)
IRON: 70 ug/dL (ref 38–169)
TIBC: 269 ug/dL (ref 250–450)
UIBC: 199 ug/dL (ref 111–343)

## 2017-03-25 MED ORDER — LOSARTAN POTASSIUM 50 MG PO TABS: 25.0000 mg | ORAL_TABLET | Freq: Every day | ORAL | 3 refills | Status: DC

## 2017-03-25 MED ORDER — LOSARTAN POTASSIUM 50 MG PO TABS: 50.0000 mg | ORAL_TABLET | Freq: Every day | ORAL | 3 refills | Status: DC

## 2017-03-25 NOTE — Addendum Note (Signed)
Addended by: Boykin Nearing on: 03/25/2017 08:25 AM   Modules accepted: Orders

## 2017-03-25 NOTE — Assessment & Plan Note (Signed)
Moderate hyperkalemia in setting of CKD and losartan use Plan: Kidney function has improved Potassium is elevated on losartan 50 mg daily, decrease to 25 mg daily and repeat potassium check at follow up, if still elevated, will have to stop losartan

## 2017-03-26 ENCOUNTER — Telehealth: Payer: Self-pay

## 2017-03-26 MED FILL — hydrALAZINE HCL 50 MG TABS: 50 | 90 days supply | Qty: 270 | Fill #0

## 2017-03-26 MED FILL — TRUEPLUS PEN NDL 32GX5/32: 32G X 4 MM | 25 days supply | Qty: 100 | Fill #2

## 2017-03-26 MED FILL — TERBINAFINE HCL 250 MG TAB: 250 | 30 days supply | Qty: 30 | Fill #0

## 2017-03-26 MED FILL — ISOSORBIDE MN ER 30 MG TAB: 30 | 90 days supply | Qty: 90 | Fill #0

## 2017-03-26 MED FILL — TRUEPLUS PEN NDL 32GX5/32": 32G X 4 MM | 25 days supply | Qty: 100 | Fill #2

## 2017-03-26 MED FILL — $LANTUS SOLOSTAR 100 UNITS/: 100 | 28 days supply | Qty: 3 | Fill #3

## 2017-03-26 MED FILL — TAMSULOSIN HCL 0.4 MG CAP: 0.4 | 90 days supply | Qty: 90 | Fill #0

## 2017-03-26 MED FILL — LOSARTAN POTASSIUM 50 MG TA: 50 | 90 days supply | Qty: 45 | Fill #0

## 2017-03-26 MED FILL — GABAPENTIN 100 MG CAPSULE: 100 | 90 days supply | Qty: 270 | Fill #0

## 2017-03-26 NOTE — Telephone Encounter (Signed)
Pt was called and informed of lab results. 

## 2017-04-09 ENCOUNTER — Encounter: Payer: Self-pay | Admitting: Family Medicine

## 2017-04-23 ENCOUNTER — Encounter: Payer: Self-pay | Admitting: Family Medicine

## 2017-06-23 ENCOUNTER — Ambulatory Visit: Payer: Medicare Other | Admitting: Family Medicine

## 2017-06-27 MED FILL — ISOSORBIDE MN ER 30 MG TAB: 30 | 90 days supply | Qty: 90 | Fill #1

## 2017-06-27 MED FILL — GABAPENTIN 100 MG CAPSULE: 100 | 90 days supply | Qty: 270 | Fill #1

## 2017-06-30 ENCOUNTER — Ambulatory Visit: Payer: Medicare Other | Admitting: Family Medicine

## 2017-07-07 ENCOUNTER — Telehealth: Payer: Self-pay | Admitting: Family Medicine

## 2017-07-07 ENCOUNTER — Other Ambulatory Visit: Payer: Self-pay | Admitting: Family Medicine

## 2017-07-07 DIAGNOSIS — I2510 Atherosclerotic heart disease of native coronary artery without angina pectoris: Secondary | ICD-10-CM

## 2017-07-07 DIAGNOSIS — E1142 Type 2 diabetes mellitus with diabetic polyneuropathy: Secondary | ICD-10-CM

## 2017-07-07 DIAGNOSIS — I10 Essential (primary) hypertension: Secondary | ICD-10-CM

## 2017-07-07 DIAGNOSIS — N400 Enlarged prostate without lower urinary tract symptoms: Secondary | ICD-10-CM

## 2017-07-07 DIAGNOSIS — Z794 Long term (current) use of insulin: Secondary | ICD-10-CM

## 2017-07-07 DIAGNOSIS — E1122 Type 2 diabetes mellitus with diabetic chronic kidney disease: Secondary | ICD-10-CM

## 2017-07-07 MED FILL — $LANTUS SOLOSTAR 100 UNITS/: 100 | 28 days supply | Qty: 3 | Fill #4

## 2017-07-07 MED FILL — LOSARTAN POTASSIUM 50 MG TA: 50 | 90 days supply | Qty: 45 | Fill #1

## 2017-07-07 MED FILL — hydrALAZINE HCL 50 MG TABS: 50 | 90 days supply | Qty: 270 | Fill #1

## 2017-07-07 MED FILL — TAMSULOSIN HCL 0.4 MG CAP: 0.4 | 90 days supply | Qty: 90 | Fill #1

## 2017-07-07 NOTE — Telephone Encounter (Signed)
Pt called requesting an appt to est care with new provider, no slots available states he will be out of current medication. Please f/up

## 2017-07-07 NOTE — Telephone Encounter (Signed)
Checked with pharmacy, they have refills on all of his medications. He needs to contact the pharmacy for refills but will need office visit for further refills. Let HIPAA-compliant message that he had refills at the pharmacy and to contact pharmacy for refills.

## 2017-07-08 MED FILL — TRUEPLUS PEN NDL 32GX5/32: 32G X 4 MM | 25 days supply | Qty: 100 | Fill #0

## 2017-07-08 MED FILL — TRUEPLUS PEN NDL 32GX5/32": 32G X 4 MM | 25 days supply | Qty: 100 | Fill #0

## 2017-07-21 ENCOUNTER — Ambulatory Visit: Payer: Medicare Other | Admitting: Internal Medicine

## 2017-09-04 MED FILL — $LANTUS SOLOSTAR 100 UNITS/: 100 | 28 days supply | Qty: 3 | Fill #5

## 2017-09-05 ENCOUNTER — Ambulatory Visit: Payer: Medicare Other | Admitting: Internal Medicine

## 2017-09-09 ENCOUNTER — Telehealth: Payer: Self-pay

## 2017-09-09 NOTE — Telephone Encounter (Signed)
error 

## 2017-10-20 MED FILL — LOSARTAN POTASSIUM 50 MG TA: 50 | 90 days supply | Qty: 45 | Fill #2

## 2017-10-20 MED FILL — TRUEPLUS PEN NDL 32GX5/32": 32G X 4 MM | 25 days supply | Qty: 100 | Fill #1

## 2017-10-20 MED FILL — TRUEPLUS PEN NDL 32GX5/32: 32G X 4 MM | 25 days supply | Qty: 100 | Fill #1

## 2017-10-20 MED FILL — hydrALAZINE HCL 50 MG TABS: 50 | 90 days supply | Qty: 270 | Fill #2

## 2017-10-20 MED FILL — $LANTUS SOLOSTAR 100 UNITS/: 100 | 28 days supply | Qty: 3 | Fill #0

## 2017-10-20 MED FILL — ISOSORBIDE MN ER 30 MG TAB: 30 | 90 days supply | Qty: 90 | Fill #2

## 2017-10-23 ENCOUNTER — Encounter: Payer: Self-pay | Admitting: Internal Medicine

## 2017-10-23 ENCOUNTER — Ambulatory Visit: Payer: Self-pay | Attending: Internal Medicine | Admitting: Internal Medicine

## 2017-10-23 VITALS — BP 192/92 | HR 75 | Temp 98.2°F | Resp 18 | Ht 64.0 in | Wt 164.0 lb

## 2017-10-23 DIAGNOSIS — E11319 Type 2 diabetes mellitus with unspecified diabetic retinopathy without macular edema: Secondary | ICD-10-CM

## 2017-10-23 DIAGNOSIS — Z87891 Personal history of nicotine dependence: Secondary | ICD-10-CM | POA: Insufficient documentation

## 2017-10-23 DIAGNOSIS — I130 Hypertensive heart and chronic kidney disease with heart failure and stage 1 through stage 4 chronic kidney disease, or unspecified chronic kidney disease: Secondary | ICD-10-CM | POA: Insufficient documentation

## 2017-10-23 DIAGNOSIS — Z1211 Encounter for screening for malignant neoplasm of colon: Secondary | ICD-10-CM

## 2017-10-23 DIAGNOSIS — E1122 Type 2 diabetes mellitus with diabetic chronic kidney disease: Secondary | ICD-10-CM | POA: Insufficient documentation

## 2017-10-23 DIAGNOSIS — Z794 Long term (current) use of insulin: Secondary | ICD-10-CM | POA: Insufficient documentation

## 2017-10-23 DIAGNOSIS — Z79899 Other long term (current) drug therapy: Secondary | ICD-10-CM | POA: Insufficient documentation

## 2017-10-23 DIAGNOSIS — R600 Localized edema: Secondary | ICD-10-CM | POA: Insufficient documentation

## 2017-10-23 DIAGNOSIS — Z23 Encounter for immunization: Secondary | ICD-10-CM | POA: Insufficient documentation

## 2017-10-23 DIAGNOSIS — E1142 Type 2 diabetes mellitus with diabetic polyneuropathy: Secondary | ICD-10-CM

## 2017-10-23 DIAGNOSIS — Z7982 Long term (current) use of aspirin: Secondary | ICD-10-CM | POA: Insufficient documentation

## 2017-10-23 DIAGNOSIS — I5032 Chronic diastolic (congestive) heart failure: Secondary | ICD-10-CM | POA: Insufficient documentation

## 2017-10-23 DIAGNOSIS — I10 Essential (primary) hypertension: Secondary | ICD-10-CM

## 2017-10-23 DIAGNOSIS — E1121 Type 2 diabetes mellitus with diabetic nephropathy: Secondary | ICD-10-CM | POA: Insufficient documentation

## 2017-10-23 DIAGNOSIS — N400 Enlarged prostate without lower urinary tract symptoms: Secondary | ICD-10-CM | POA: Insufficient documentation

## 2017-10-23 DIAGNOSIS — E1140 Type 2 diabetes mellitus with diabetic neuropathy, unspecified: Secondary | ICD-10-CM | POA: Insufficient documentation

## 2017-10-23 DIAGNOSIS — K029 Dental caries, unspecified: Secondary | ICD-10-CM | POA: Insufficient documentation

## 2017-10-23 DIAGNOSIS — N184 Chronic kidney disease, stage 4 (severe): Secondary | ICD-10-CM | POA: Insufficient documentation

## 2017-10-23 DIAGNOSIS — Z9889 Other specified postprocedural states: Secondary | ICD-10-CM | POA: Insufficient documentation

## 2017-10-23 MED ORDER — INSULIN GLARGINE 100 UNIT/ML SOLOSTAR PEN: 5.0000 [IU] | PEN_INJECTOR | Freq: Every day | SUBCUTANEOUS | 3 refills | Status: DC

## 2017-10-23 MED ORDER — LOSARTAN POTASSIUM 50 MG PO TABS: 25.0000 mg | ORAL_TABLET | Freq: Every day | ORAL | 3 refills | Status: DC

## 2017-10-23 MED ORDER — ISOSORBIDE MONONITRATE ER 30 MG PO TB24: 30.0000 mg | ORAL_TABLET | Freq: Every day | ORAL | 3 refills | Status: DC

## 2017-10-23 MED ORDER — FUROSEMIDE 20 MG PO TABS: ORAL_TABLET | ORAL | 3 refills | Status: DC

## 2017-10-23 MED ORDER — TAMSULOSIN HCL 0.4 MG PO CAPS: 0.4000 mg | ORAL_CAPSULE | Freq: Every day | ORAL | 3 refills | Status: DC

## 2017-10-23 MED ORDER — HYDRALAZINE HCL 50 MG PO TABS: 50.0000 mg | ORAL_TABLET | Freq: Three times a day (TID) | ORAL | 3 refills | Status: DC

## 2017-10-23 MED ORDER — GABAPENTIN 100 MG PO CAPS: 100.0000 mg | ORAL_CAPSULE | Freq: Three times a day (TID) | ORAL | 1 refills | Status: DC

## 2017-10-23 MED FILL — ?TAMSULOSIN HCL 0.4 MG CAP: 0.4 | 30 days supply | Qty: 30 | Fill #0

## 2017-10-23 MED FILL — ?FUROSEMIDE 20 MG TABLET: 20 | 30 days supply | Qty: 12 | Fill #0

## 2017-10-23 MED FILL — GABAPENTIN 100 MG CAPSULE: 100 | 30 days supply | Qty: 90 | Fill #0

## 2017-10-23 NOTE — Patient Instructions (Signed)
You have been referred to a nephrologist for your kidney disease.  You should avoid taking over-the-counter pain medications like ibuprofen, Naprosyn, Aleve, Advil.  You have been referred to a gastroenterologist for colon cancer screening.

## 2017-10-23 NOTE — Progress Notes (Signed)
Patient ID: Joshua [name], male    DOB: [birth year]  MRN: [id number]  CC: Establish Care   Subjective: Joshua [name] is a 66 y.o. male who presents for chronic ds management. Last saw Dr. Adrian Blackwater 02/2017 His concerns today include:  Patient with history of HTN, CKD stage IV, BPH, DM with neuropathy, nephropathy and retinopathy, ACD, chronic diastolic CHF, s/p Lt transmetatarsal amputation  1. HTN/CKD stage 4/CHF: -did not bring meds with him.  He does not know the names of the medicines that he is taking -out of all meds since yesterday -no CP/SOB/LE edema -still making good urine.  -saw neph in past  2. DM:  Compliant with Lantus.  -does not check BS. Has problems seeing even if he checked it.  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  Overdue for eye exam but cannot afford.  lives with cousin and his wife and he depends on her to check BS but they travel a lot -avoids sweets -use to walk before cold weather set in. Now does some walking in place at home -numbness in feet. On Gabapentin  Patient Active Problem List   Diagnosis Date Noted  . Hyperkalemia 03/25/2017  . Seborrhea 09/16/2016  . Elevated PSA 01/04/2016  . Anemia 01/04/2016  . Rhinitis, allergic 01/02/2016  . Daytime somnolence 12/05/2015  . BPH (benign prostatic hyperplasia) 12/05/2015  . Status post transmetatarsal amputation of left foot ([place]) 08/30/2015  . Mild CAD 06/14/2015  . CKD (chronic kidney disease) stage 3, GFR 30-59 ml/min (HCC) 06/14/2015  . Inguinal pain 05/10/2014  . Chest pain 04/13/2014  . Anxiety state, unspecified 03/06/2014  . Nephrotic syndrome 01/14/2014  . Elevated alkaline phosphatase level 01/11/2014  . History of DVT of lower extremity 01/11/2014  . Chronic diastolic heart failure ([place]) 01/11/2014  . New onset seizure ([place]) 01/07/2014  . Chronic leg pain 01/05/2014  . Diabetic neuropathy 03/22/2013  . Abnormality of gait 02/27/2013  . Diabetic macular edema  ([place]) 12/22/2012  . Diabetic proliferative retinopathy ([place]) 12/22/2012  . Intragel vitreous hemorrhage ([place]) 12/22/2012  . Diabetes mellitus with renal manifestation ([place]) 10/15/2012  . Hypoalbuminemia 10/15/2012  . Diabetic nephropathy with proteinuria ([place]) 10/15/2012  . High risk sexual behavior 10/14/2012  . Hypertension 10/14/2012  . Chronic venous insufficiency 10/07/2012     Current Outpatient Medications on File Prior to Visit  Medication Sig Dispense Refill  . acetaminophen (TYLENOL 8 HOUR) 650 MG CR tablet Take 1 tablet (650 mg total) by mouth every 8 (eight) hours as needed for pain. 90 tablet 2  . aspirin EC 81 MG tablet Take 1 tablet (81 mg total) by mouth daily. 90 tablet 3  . fexofenadine (ALLEGRA ALLERGY) 180 MG tablet Take 1 tablet (180 mg total) by mouth daily.    . Glucosamine-Chondroitin 750-600 MG TABS Take 2 tablets by mouth daily.  0  . IRON, FERROUS GLUCONATE, PO Take 18 mg by mouth daily.    Marland Kitchen OVER THE COUNTER MEDICATION Take 2 sprays by mouth daily. Reported on 01/02/2016    . Selenium Sulfide 2.25 % SHAM Apply 1 application topically daily. Apply to beard area, let dry then wash out 180 mL 1  . TRUEPLUS PEN NEEDLES 32G X 4 MM MISC USE AS DIRECTED 100 each 3  . Turmeric 500 MG CAPS Take 500 mg by mouth daily.     No current facility-administered medications on file prior to visit.     No Known Allergies  Social History   Socioeconomic History  . Marital status: Single    Spouse name: Not on file  . Number of children: 5  . Years of education: Masters  . Highest education level: Not on file  Social Needs  . Financial resource strain: Not on file  . Food insecurity - worry: Not on file  . Food insecurity - inability: Not on file  . Transportation needs - medical: Not on file  . Transportation needs - non-medical: Not on file  Occupational History  . Occupation: Surveyor, quantity: [employer]  Tobacco Use  . Smoking status: Former Smoker    Years:  20.00    Types: Cigarettes    Last attempt to quit: 11/25/1992    Years since quitting: 24.9  . Smokeless tobacco: Never Used  Substance and Sexual Activity  . Alcohol use: No    Alcohol/week: 9.0 oz    Types: 15 Standard drinks or equivalent per week    Comment: Rarely.  . Drug use: No  . Sexual activity: Not on file    Comment: 09/2012: > 15 partners in past 3 years, inconsistent condom use  Other Topics Concern  . Not on file  Social History Narrative   Has a masters degree in education    Family History  Problem Relation Age of Onset  . GI problems Unknown        none    Past Surgical History:  Procedure Laterality Date  . I&D EXTREMITY Left 01/17/2014   Procedure: IRRIGATION AND DEBRIDEMENT  LEFT FOOT;  Surgeon: Wylene Simmer, MD;  Location: [place];  Service: Orthopedics;  Laterality: Left;  . KNEE ARTHROSCOPY  2010   3 times, right knee  . LEFT HEART CATHETERIZATION WITH CORONARY ANGIOGRAM N/A 02/02/2014   Procedure: LEFT HEART CATHETERIZATION WITH CORONARY ANGIOGRAM;  Surgeon: Burnell Blanks, MD;  Location[facility] C[facility] LAB;  Service: Cardiovascular;  Laterality: N/A;  . TRANSMETATARSAL AMPUTATION Left 07/14/2014   Procedure: TRANSMETATARSAL AMPUTATION;  Surgeon: Wylene Simmer, MD;  Location: [place];  Service: Orthopedics;  Laterality: Left;  percutaneous achilles tendon lengthening    ROS: Review of Systems  Constitutional: Negative for appetite change and fatigue.  Eyes: Positive for visual disturbance.  Respiratory: Negative for shortness of breath.   Cardiovascular: Negative for chest pain and leg swelling.  Neurological: Negative for dizziness and headaches.    PHYSICAL EXAM: BP (!) 192/92 (BP Location: Left Arm, Patient Position: Sitting, Cuff Size: Normal)   Pulse 75   Temp 98.2 F (36.8 C) (Oral)   Resp 18   Ht 5\' 4"  (1.626 m)   Wt 164 lb (74.4 kg)   SpO2 95%   BMI 28.15 kg/m   Wt Readings from Last 3 Encounters:  10/23/17 164 lb (74.4 kg)  03/24/17  161 lb 12.8 oz (73.4 kg)  12/10/16 159 lb 9.6 oz (72.4 kg)   Physical Exam General appearance - alert, well appearing, and in no distress Mental status - alert, oriented to person, place, and time, normal mood, behavior, speech, dress, motor activity, and thought processes Eyes -slightly pale conjunctiva.  Small pupils.  Extraocular movement intact. Mouth - No oral lesions.  Several teeth broken off in the gum. Neck - supple, no significant adenopathy Chest - clear to auscultation, no wheezes, rales or rhonchi, symmetric air entry Heart - normal rate, regular rhythm, normal S1, S2, no murmurs, rubs, clicks or gallops Extremities - 1+ LE edema Diabetic Foot Exam - Simple  Simple Foot Form Visual Inspection See comments:  Yes Sensation Testing See comments:  Yes Pulse Check See comments:  Yes Comments Patient has partial amputation of the left foot. Right foot with decreased DP/posterior tibialis pulses.  Abnormal LEAP.  No ulcers or callus on the foot     BS 126 ASSESSMENT AND PLAN: 1. Type 2 diabetes mellitus with chronic kidney disease, with long-term current use of insulin, unspecified CKD stage ([place]) Level of control unknown.  Will check A1c today. Encouraged healthy eating and exercise as tolerated. - Ambulatory referral to Nephrology - CBC - Comprehensive metabolic panel - Lipid panel - Hemoglobin A1c - Insulin Glargine (LANTUS SOLOSTAR) 100 UNIT/ML Solostar Pen; Inject 5 Units into the skin daily at 10 pm.  Dispense: 45 mL; Refill: 3  2. Benign prostatic hyperplasia, unspecified whether lower urinary tract symptoms present - tamsulosin (FLOMAX) 0.4 MG CAPS capsule; Take 1 capsule (0.4 mg total) by mouth daily after supper.  Dispense: 90 capsule; Refill: 3  3. Diabetic retinopathy associated with type 2 diabetes mellitus, macular edema presence unspecified, unspecified laterality, unspecified retinopathy severity ([place]) - Ambulatory referral to Ophthalmology  4.  Diabetic neuropathy - gabapentin (NEURONTIN) 100 MG capsule; Take 1 capsule (100 mg total) by mouth 3 (three) times daily.  Dispense: 270 capsule; Refill: 1  5. Essential hypertension Not at goal.  Patient will get medicines filled today. - isosorbide mononitrate (IMDUR) 30 MG 24 hr tablet; Take 1 tablet (30 mg total) by mouth daily.  Dispense: 90 tablet; Refill: 3 - losartan (COZAAR) 50 MG tablet; Take 0.5 tablets (25 mg total) by mouth daily.  Dispense: 90 tablet; Refill: 3 - hydrALAZINE (APRESOLINE) 50 MG tablet; Take 1 tablet (50 mg total) by mouth 3 (three) times daily.  Dispense: 270 tablet; Refill: 3  6. Dental caries - Ambulatory referral to Dentistry  7. Colon cancer screening - Ambulatory referral to Gastroenterology  8. Need for influenza vaccination - Flu Vaccine QUAD 6+ mos PF IM (Fluarix Quad PF)  9. Leg edema - furosemide (LASIX) 20 MG tablet; 1 tab PO Q Mon/Wed/Frid  Dispense: 30 tablet; Refill: 3  Patient was given the opportunity to ask questions.  Patient verbalized understanding of the plan and was able to repeat key elements of the plan.   Orders Placed This Encounter  Procedures  . Flu Vaccine QUAD 6+ mos PF IM (Fluarix Quad PF)  . CBC  . Comprehensive metabolic panel  . Lipid panel  . Hemoglobin A1c  . Ambulatory referral to Nephrology  . Ambulatory referral to Ophthalmology  . Ambulatory referral to Gastroenterology  . Ambulatory referral to Dentistry     Requested Prescriptions   Signed Prescriptions Disp Refills  . tamsulosin (FLOMAX) 0.4 MG CAPS capsule 90 capsule 3    Sig: Take 1 capsule (0.4 mg total) by mouth daily after supper.  . gabapentin (NEURONTIN) 100 MG capsule 270 capsule 1    Sig: Take 1 capsule (100 mg total) by mouth 3 (three) times daily.  . isosorbide mononitrate (IMDUR) 30 MG 24 hr tablet 90 tablet 3    Sig: Take 1 tablet (30 mg total) by mouth daily.  Marland Kitchen losartan (COZAAR) 50 MG tablet 90 tablet 3    Sig: Take 0.5 tablets (25  mg total) by mouth daily.  . hydrALAZINE (APRESOLINE) 50 MG tablet 270 tablet 3    Sig: Take 1 tablet (50 mg total) by mouth 3 (three) times daily.  . Insulin Glargine (LANTUS SOLOSTAR) 100 UNIT/ML Solostar Pen  45 mL 3    Sig: Inject 5 Units into the skin daily at 10 pm.  . furosemide (LASIX) 20 MG tablet 30 tablet 3    Sig: 1 tab PO Q Mon/Wed/Frid    Return in about 3 months (around 01/22/2018).  Karle Plumber, MD, FACP

## 2017-10-24 LAB — LIPID PANEL
Chol/HDL Ratio: 2.3 ratio (ref 0.0–5.0)
Cholesterol, Total: 177 mg/dL (ref 100–199)
HDL: 78 mg/dL (ref >39)
LDL Calculated: 89 mg/dL (ref 0–99)
TRIGLYCERIDES: 49 mg/dL (ref 0–149)
VLDL CHOLESTEROL CAL: 10 mg/dL (ref 5–40)

## 2017-10-24 LAB — CBC
HEMATOCRIT: 36.4 % — AB (ref 37.5–51.0)
HEMOGLOBIN: 11.5 g/dL — AB (ref 13.0–17.7)
MCH: 27.8 pg (ref 26.6–33.0)
MCHC: 31.6 g/dL (ref 31.5–35.7)
MCV: 88 fL (ref 79–97)
Platelets: 262 10*3/uL (ref 150–379)
RBC: 4.14 x10E6/uL (ref 4.14–5.80)
RDW: 15.1 % (ref 12.3–15.4)
WBC: 6.3 10*3/uL (ref 3.4–10.8)

## 2017-10-24 LAB — COMPREHENSIVE METABOLIC PANEL
A/G RATIO: 1.5 (ref 1.2–2.2)
ALT: 15 IU/L (ref 0–44)
AST: 20 IU/L (ref 0–40)
Albumin: 4.1 g/dL (ref 3.6–4.8)
Alkaline Phosphatase: 99 IU/L (ref 39–117)
BUN/Creatinine Ratio: 13 (ref 10–24)
BUN: 42 mg/dL — ABNORMAL HIGH (ref 8–27)
Bilirubin Total: 0.2 mg/dL (ref 0.0–1.2)
CALCIUM: 8.5 mg/dL — AB (ref 8.6–10.2)
CO2: 21 mmol/L (ref 20–29)
CREATININE: 3.29 mg/dL — AB (ref 0.76–1.27)
Chloride: 109 mmol/L — ABNORMAL HIGH (ref 96–106)
GFR, EST AFRICAN AMERICAN: 21 mL/min/{1.73_m2} — AB (ref >59)
GFR, EST NON AFRICAN AMERICAN: 19 mL/min/{1.73_m2} — AB (ref >59)
GLOBULIN, TOTAL: 2.8 g/dL (ref 1.5–4.5)
Glucose: 73 mg/dL (ref 65–99)
POTASSIUM: 4.5 mmol/L (ref 3.5–5.2)
SODIUM: 145 mmol/L — AB (ref 134–144)
TOTAL PROTEIN: 6.9 g/dL (ref 6.0–8.5)

## 2017-10-24 LAB — HEMOGLOBIN A1C
Est. average glucose Bld gHb Est-mCnc: 131 mg/dL
Hgb A1c MFr Bld: 6.2 % — ABNORMAL HIGH (ref 4.8–5.6)

## 2017-11-14 ENCOUNTER — Telehealth: Payer: Self-pay | Admitting: *Deleted

## 2017-11-14 NOTE — Telephone Encounter (Signed)
Patient verified DOB Patient is aware of being referred to a kidney specialist for slight decrease in function. Patient aware of being mildly anemic and to take daily iron. Patient had no further questions.

## 2017-11-14 NOTE — Telephone Encounter (Signed)
-----   Message from Ladell Pier, MD sent at 10/25/2017 10:28 PM EST ----- Let pt know tha he has a mild stable anemia. This can be seen in people with kidney disease. Continue iron supplement. His kidney function is not 100% and a bit worse compared to 7 mths ago. I have referred him to a kidney specialist. Avoid taking OTC pain meds as these can make kidney function worse. Cholesterol level good. Diabetes under good control.

## 2018-01-20 MED FILL — hydrALAZINE HCL 50 MG TABS: 50 | 90 days supply | Qty: 270 | Fill #3

## 2018-01-20 MED FILL — ISOSORBIDE MN ER 30 MG TAB: 30 | 90 days supply | Qty: 90 | Fill #3

## 2018-01-20 MED FILL — LOSARTAN POTASSIUM 50 MG TA: 50 | 90 days supply | Qty: 45 | Fill #3

## 2018-01-22 ENCOUNTER — Encounter: Payer: Self-pay | Admitting: Internal Medicine

## 2018-01-22 ENCOUNTER — Ambulatory Visit: Payer: Medicare Other | Attending: Internal Medicine | Admitting: Internal Medicine

## 2018-01-22 VITALS — BP 165/96 | HR 79 | Temp 98.0°F | Resp 16 | Ht 64.5 in | Wt 151.2 lb

## 2018-01-22 DIAGNOSIS — M170 Bilateral primary osteoarthritis of knee: Secondary | ICD-10-CM

## 2018-01-22 DIAGNOSIS — I129 Hypertensive chronic kidney disease with stage 1 through stage 4 chronic kidney disease, or unspecified chronic kidney disease: Secondary | ICD-10-CM | POA: Insufficient documentation

## 2018-01-22 DIAGNOSIS — E11319 Type 2 diabetes mellitus with unspecified diabetic retinopathy without macular edema: Secondary | ICD-10-CM

## 2018-01-22 DIAGNOSIS — Z794 Long term (current) use of insulin: Secondary | ICD-10-CM | POA: Insufficient documentation

## 2018-01-22 DIAGNOSIS — Z87891 Personal history of nicotine dependence: Secondary | ICD-10-CM | POA: Insufficient documentation

## 2018-01-22 DIAGNOSIS — N400 Enlarged prostate without lower urinary tract symptoms: Secondary | ICD-10-CM | POA: Insufficient documentation

## 2018-01-22 DIAGNOSIS — E118 Type 2 diabetes mellitus with unspecified complications: Secondary | ICD-10-CM

## 2018-01-22 DIAGNOSIS — N184 Chronic kidney disease, stage 4 (severe): Secondary | ICD-10-CM | POA: Insufficient documentation

## 2018-01-22 DIAGNOSIS — Z7982 Long term (current) use of aspirin: Secondary | ICD-10-CM | POA: Insufficient documentation

## 2018-01-22 DIAGNOSIS — R600 Localized edema: Secondary | ICD-10-CM | POA: Insufficient documentation

## 2018-01-22 DIAGNOSIS — I10 Essential (primary) hypertension: Secondary | ICD-10-CM

## 2018-01-22 DIAGNOSIS — E1121 Type 2 diabetes mellitus with diabetic nephropathy: Secondary | ICD-10-CM | POA: Insufficient documentation

## 2018-01-22 DIAGNOSIS — E1122 Type 2 diabetes mellitus with diabetic chronic kidney disease: Secondary | ICD-10-CM

## 2018-01-22 MED ORDER — FUROSEMIDE 20 MG PO TABS: 20.0000 mg | ORAL_TABLET | Freq: Every day | ORAL | 3 refills | Status: DC

## 2018-01-22 MED FILL — GABAPENTIN 100 MG CAPSULE: 100 | 90 days supply | Qty: 270 | Fill #1

## 2018-01-22 MED FILL — FUROSEMIDE 20 MG TABLET: 20 | 30 days supply | Qty: 12 | Fill #1

## 2018-01-22 MED FILL — $LANTUS SOLOSTAR 100 UNITS/: 100 | 84 days supply | Qty: 9 | Fill #0

## 2018-01-22 NOTE — Patient Instructions (Signed)
Please bring all of your medication bottles with you on your next visit.

## 2018-01-22 NOTE — Progress Notes (Signed)
Patient ID: Joshua [name], male    DOB: 1951-07-06  MRN: [id number]  CC: Diabetes and Hypertension   Subjective: Joshua [name] is a 67 y.o. male who presents for chronic ds management.  Last seen 09/2017 His concerns today include:  Patient with history of HTN, CKD stage IV, BPH, DM with neuropathy, nephropathy and retinopathy, ACD, chronic diastolic CHF, s/p Lt transmetatarsal amputation  1.  C/o arthritis in knees.  Pain RT>LT Takes Tylenol OTC as needed. Not daily -ambulates with a cane No falls. Reports relief when he takes the Tylenol  2.  CKD stage 4/HTN:  No appt as yet with nephrology.  Has OC.  Medicare showing on chart but pt states he does not have Medicare as yet.  Has appt with lawyer in March to try to get Medicare  -no LE edema.  Limits salt in foods No CP/SOB -he does not have med bottles with him.  Every morning, he takes all meds out that he will have to take that day and place them in one bottle  3.  DM:  Not checking BS.  Lives with cousin who is out of country until May.   -has friends who cook for him  4.  Anemia:  Taking OTC Iron  Patient Active Problem List   Diagnosis Date Noted  . Hyperkalemia 03/25/2017  . Seborrhea 09/16/2016  . Elevated PSA 01/04/2016  . Anemia 01/04/2016  . Rhinitis, allergic 01/02/2016  . Daytime somnolence 12/05/2015  . BPH (benign prostatic hyperplasia) 12/05/2015  . Status post transmetatarsal amputation of left foot ([place]) 08/30/2015  . Mild CAD 06/14/2015  . CKD (chronic kidney disease) stage 3, GFR 30-59 ml/min (HCC) 06/14/2015  . Inguinal pain 05/10/2014  . Chest pain 04/13/2014  . Anxiety state, unspecified 03/06/2014  . Nephrotic syndrome 01/14/2014  . Elevated alkaline phosphatase level 01/11/2014  . History of DVT of lower extremity 01/11/2014  . Chronic diastolic heart failure ([place]) 01/11/2014  . New onset seizure ([place]) 01/07/2014  . Chronic leg pain 01/05/2014  . Diabetic neuropathy 03/22/2013  .  Abnormality of gait 02/27/2013  . Diabetic macular edema ([place]) 12/22/2012  . Diabetic proliferative retinopathy ([place]) 12/22/2012  . Intragel vitreous hemorrhage ([place]) 12/22/2012  . Diabetes mellitus with renal manifestation ([place]) 10/15/2012  . Hypoalbuminemia 10/15/2012  . Diabetic nephropathy with proteinuria ([place]) 10/15/2012  . High risk sexual behavior 10/14/2012  . Hypertension 10/14/2012  . Chronic venous insufficiency 10/07/2012     Current Outpatient Medications on File Prior to Visit  Medication Sig Dispense Refill  . acetaminophen (TYLENOL 8 HOUR) 650 MG CR tablet Take 1 tablet (650 mg total) by mouth every 8 (eight) hours as needed for pain. 90 tablet 2  . aspirin EC 81 MG tablet Take 1 tablet (81 mg total) by mouth daily. 90 tablet 3  . fexofenadine (ALLEGRA ALLERGY) 180 MG tablet Take 1 tablet (180 mg total) by mouth daily.    . furosemide (LASIX) 20 MG tablet 1 tab PO Q Mon/Wed/Frid 30 tablet 3  . gabapentin (NEURONTIN) 100 MG capsule Take 1 capsule (100 mg total) by mouth 3 (three) times daily. 270 capsule 1  . Glucosamine-Chondroitin 750-600 MG TABS Take 2 tablets by mouth daily.  0  . hydrALAZINE (APRESOLINE) 50 MG tablet Take 1 tablet (50 mg total) by mouth 3 (three) times daily. 270 tablet 3  . Insulin Glargine (LANTUS SOLOSTAR) 100 UNIT/ML Solostar Pen Inject 5 Units into the skin daily at 10 pm. 45 mL 3  .  IRON, FERROUS GLUCONATE, PO Take 18 mg by mouth daily.    . isosorbide mononitrate (IMDUR) 30 MG 24 hr tablet Take 1 tablet (30 mg total) by mouth daily. 90 tablet 3  . losartan (COZAAR) 50 MG tablet Take 0.5 tablets (25 mg total) by mouth daily. 90 tablet 3  . OVER THE COUNTER MEDICATION Take 2 sprays by mouth daily. Reported on 01/02/2016    . Selenium Sulfide 2.25 % SHAM Apply 1 application topically daily. Apply to beard area, let dry then wash out 180 mL 1  . tamsulosin (FLOMAX) 0.4 MG CAPS capsule Take 1 capsule (0.4 mg total) by mouth daily after supper. 90  capsule 3  . TRUEPLUS PEN NEEDLES 32G X 4 MM MISC USE AS DIRECTED 100 each 3  . Turmeric 500 MG CAPS Take 500 mg by mouth daily.     No current facility-administered medications on file prior to visit.     No Known Allergies  Social History   Socioeconomic History  . Marital status: Single    Spouse name: Not on file  . Number of children: 5  . Years of education: Masters  . Highest education level: Not on file  Social Needs  . Financial resource strain: Not on file  . Food insecurity - worry: Not on file  . Food insecurity - inability: Not on file  . Transportation needs - medical: Not on file  . Transportation needs - non-medical: Not on file  Occupational History  . Occupation: Surveyor, quantity: [employer]  Tobacco Use  . Smoking status: Former Smoker    Years: 20.00    Types: Cigarettes    Last attempt to quit: 11/25/1992    Years since quitting: 25.1  . Smokeless tobacco: Never Used  Substance and Sexual Activity  . Alcohol use: No    Alcohol/week: 9.0 oz    Types: 15 Standard drinks or equivalent per week    Comment: Rarely.  . Drug use: No  . Sexual activity: Not on file    Comment: 09/2012: > 15 partners in past 3 years, inconsistent condom use  Other Topics Concern  . Not on file  Social History Narrative   Has a masters degree in education    Family History  Problem Relation Age of Onset  . GI problems Unknown        none    Past Surgical History:  Procedure Laterality Date  . I&D EXTREMITY Left 01/17/2014   Procedure: IRRIGATION AND DEBRIDEMENT  LEFT FOOT;  Surgeon: Wylene Simmer, MD;  Location: [place];  Service: Orthopedics;  Laterality: Left;  . KNEE ARTHROSCOPY  2010   3 times, right knee  . LEFT HEART CATHETERIZATION WITH CORONARY ANGIOGRAM N/A 02/02/2014   Procedure: LEFT HEART CATHETERIZATION WITH CORONARY ANGIOGRAM;  Surgeon: Burnell Blanks, MD;  [facility] CATH LAB;  Service: Cardiovascular;  Laterality: N/A;  . TRANSMETATARSAL  AMPUTATION Left 07/14/2014   Procedure: TRANSMETATARSAL AMPUTATION;  Surgeon: Wylene Simmer, MD;  Location: [place];  Service: Orthopedics;  Laterality: Left;  percutaneous achilles tendon lengthening    ROS: Review of Systems Neg except as above  PHYSICAL EXAM: BP (!) 165/96   Pulse 79   Temp 98 F (36.7 C) (Oral)   Resp 16   Ht 5' 4.5" (1.638 m)   Wt 151 lb 3.2 oz (68.6 kg)   SpO2 95%   BMI 25.55 kg/m   Repeat BP 150/80  Wt Readings from Last 3 Encounters:  01/22/18 151 lb 3.2 oz (68.6 kg)  10/23/17 164 lb (74.4 kg)  03/24/17 161 lb 12.8 oz (73.4 kg)   Physical Exam  General appearance - alert, well appearing, and in no distress Mental status - alert, oriented to person, place, and time, normal mood, behavior, speech, dress, motor activity, and thought processes Mouth - mucous membranes moist, pharynx normal without lesions Neck - supple, no significant adenopathy Chest - clear to auscultation, no wheezes, rales or rhonchi, symmetric air entry Heart - normal rate, regular rhythm, normal S1, S2, no murmurs, rubs, clicks or gallops Extremities -trace to 1+ LE edema MSK:  Ambulates with a cane.  Knees: no edema or point tenderness. Diabetic Foot Exam - Simple   Simple Foot Form Visual Inspection See comments:  Yes Sensation Testing See comments:  Yes Pulse Check Posterior Tibialis and Dorsalis pulse intact bilaterally:  Yes Comments LT foot - partial amputation.  No ulcers or callous.  Dec sensation on leap exam     Results for orders placed or performed in visit on 10/23/17  CBC  Result Value Ref Range   WBC 6.3 3.4 - 10.8 x10E3/uL   RBC 4.14 4.14 - 5.80 x10E6/uL   Hemoglobin 11.5 (L) 13.0 - 17.7 g/dL   Hematocrit 36.4 (L) 37.5 - 51.0 %   MCV 88 79 - 97 fL   MCH 27.8 26.6 - 33.0 pg   MCHC 31.6 31.5 - 35.7 g/dL   RDW 15.1 12.3 - 15.4 %   Platelets 262 150 - 379 x10E3/uL  Comprehensive metabolic panel  Result Value Ref Range   Glucose 73 65 - 99 mg/dL   BUN 42  (H) 8 - 27 mg/dL   Creatinine, Ser 3.29 (H) 0.76 - 1.27 mg/dL   GFR calc non Af Amer 19 (L) >59 mL/min/1.73   GFR calc Af Amer 21 (L) >59 mL/min/1.73   BUN/Creatinine Ratio 13 10 - 24   Sodium 145 (H) 134 - 144 mmol/L   Potassium 4.5 3.5 - 5.2 mmol/L   Chloride 109 (H) 96 - 106 mmol/L   CO2 21 20 - 29 mmol/L   Calcium 8.5 (L) 8.6 - 10.2 mg/dL   Total Protein 6.9 6.0 - 8.5 g/dL   Albumin 4.1 3.6 - 4.8 g/dL   Globulin, Total 2.8 1.5 - 4.5 g/dL   Albumin/Globulin Ratio 1.5 1.2 - 2.2   Bilirubin Total 0.2 0.0 - 1.2 mg/dL   Alkaline Phosphatase 99 39 - 117 IU/L   AST 20 0 - 40 IU/L   ALT 15 0 - 44 IU/L  Lipid panel  Result Value Ref Range   Cholesterol, Total 177 100 - 199 mg/dL   Triglycerides 49 0 - 149 mg/dL   HDL 78 >39 mg/dL   VLDL Cholesterol Cal 10 5 - 40 mg/dL   LDL Calculated 89 0 - 99 mg/dL   Chol/HDL Ratio 2.3 0.0 - 5.0 ratio  Hemoglobin A1c  Result Value Ref Range   Hgb A1c MFr Bld 6.2 (H) 4.8 - 5.6 %   Est. average glucose Bld gHb Est-mCnc 131 mg/dL   Today:  BS 153/A1C 6.4  ASSESSMENT AND PLAN: 1. Controlled type 2 diabetes mellitus with complication, with long-term current use of insulin (HCC) Continue Lantus - POCT glucose (manual entry) - POCT glycosylated hemoglobin (Hb A1C)  2. CKD stage 4 due to type 2 diabetes mellitus (HCC) Change Lasix to daily dosing Will message referral coordinator today about his nephrology referral. - furosemide (LASIX) 20 MG tablet;  Take 1 tablet (20 mg total) by mouth daily. 1 tab PO Q Mon/Wed/Frid  Dispense: 90 tablet; Refill: 3 - Basic metabolic panel  3. Essential hypertension Not at goal - furosemide (LASIX) 20 MG tablet; Take 1 tablet (20 mg total) by mouth daily. 1 tab PO Q Mon/Wed/Frid  Dispense: 90 tablet; Refill: 3  4. Primary osteoarthritis of both knees Continue Tylenol OTC as needed  5. Diabetic retinopathy of both eyes associated with type 2 diabetes mellitus, macular edema presence unspecified, unspecified  retinopathy severity (HCC)   6. Leg edema - furosemide (LASIX) 20 MG tablet; Take 1 tablet (20 mg total) by mouth daily. 1 tab PO Q Mon/Wed/Frid  Dispense: 90 tablet; Refill: 3  Patient was given the opportunity to ask questions.  Patient verbalized understanding of the plan and was able to repeat key elements of the plan.   No orders of the defined types were placed in this encounter.    Requested Prescriptions    No prescriptions requested or ordered in this encounter    No Follow-up on file.  Karle Plumber, MD, FACP

## 2018-01-23 LAB — BASIC METABOLIC PANEL
BUN/Creatinine Ratio: 15 (ref 10–24)
BUN: 43 mg/dL — AB (ref 8–27)
CO2: 19 mmol/L — AB (ref 20–29)
CREATININE: 2.94 mg/dL — AB (ref 0.76–1.27)
Calcium: 8.9 mg/dL (ref 8.6–10.2)
Chloride: 105 mmol/L (ref 96–106)
GFR calc Af Amer: 25 mL/min/{1.73_m2} — ABNORMAL LOW (ref >59)
GFR calc non Af Amer: 21 mL/min/{1.73_m2} — ABNORMAL LOW (ref >59)
Glucose: 122 mg/dL — ABNORMAL HIGH (ref 65–99)
Potassium: 4.8 mmol/L (ref 3.5–5.2)
Sodium: 138 mmol/L (ref 134–144)

## 2018-01-26 ENCOUNTER — Telehealth: Payer: Self-pay

## 2018-01-26 ENCOUNTER — Telehealth: Payer: Self-pay | Admitting: Internal Medicine

## 2018-01-26 LAB — GLUCOSE, POCT (MANUAL RESULT ENTRY): POC Glucose: 153 mg/dl — AB (ref 70–99)

## 2018-01-26 LAB — POCT GLYCOSYLATED HEMOGLOBIN (HGB A1C): Hemoglobin A1C: 6.4

## 2018-01-26 NOTE — Telephone Encounter (Signed)
Contacted pt to go over lab results pt is aware and doesn't have any questions or concerns 

## 2018-01-26 NOTE — Telephone Encounter (Signed)
Pt. Called requesting to speak with his nurse regarding his lab results. Please f/u with pt.

## 2018-01-27 NOTE — Telephone Encounter (Signed)
Tried to reach pt to go over DR. Johnson response pt didn't answer left a detailed vm informing him of Dr. Wynetta Emery response and if he has any questions or concerns to give me a call

## 2018-01-27 NOTE — Telephone Encounter (Signed)
Returned pt call. Pt had questions on how much insulin is he suppose to be taking and what can he do about his kidney function. I went through pt medication and informed pt he is suppose to be injecting 5 units into the skin at 10pm daily. Pt states he understands. Please f/u

## 2018-03-23 ENCOUNTER — Ambulatory Visit: Payer: Medicare Other | Admitting: Internal Medicine

## 2018-04-21 MED FILL — LOSARTAN POTASSIUM 50 MG TA: 50 | 30 days supply | Qty: 60 | Fill #0

## 2018-04-21 MED FILL — FUROSEMIDE 20 MG TABLET: 20 | 30 days supply | Qty: 12 | Fill #2

## 2018-04-21 MED FILL — $LANTUS SOLOSTAR 100 UNITS/: 100 | 84 days supply | Qty: 9 | Fill #1

## 2018-04-21 MED FILL — hydrALAZINE HCL 50 MG TABS: 50 | 30 days supply | Qty: 90 | Fill #0

## 2018-04-24 ENCOUNTER — Ambulatory Visit: Payer: Self-pay | Admitting: Internal Medicine

## 2018-04-24 ENCOUNTER — Ambulatory Visit: Payer: Self-pay | Attending: Internal Medicine | Admitting: Family Medicine

## 2018-04-24 ENCOUNTER — Other Ambulatory Visit: Payer: Self-pay

## 2018-04-24 ENCOUNTER — Encounter: Payer: Self-pay | Admitting: Family Medicine

## 2018-04-24 VITALS — BP 182/74 | HR 73 | Temp 97.3°F | Ht 64.5 in | Wt 155.4 lb

## 2018-04-24 DIAGNOSIS — Z8611 Personal history of tuberculosis: Secondary | ICD-10-CM | POA: Insufficient documentation

## 2018-04-24 DIAGNOSIS — D509 Iron deficiency anemia, unspecified: Secondary | ICD-10-CM | POA: Insufficient documentation

## 2018-04-24 DIAGNOSIS — I10 Essential (primary) hypertension: Secondary | ICD-10-CM

## 2018-04-24 DIAGNOSIS — Z794 Long term (current) use of insulin: Secondary | ICD-10-CM | POA: Insufficient documentation

## 2018-04-24 DIAGNOSIS — N184 Chronic kidney disease, stage 4 (severe): Secondary | ICD-10-CM | POA: Insufficient documentation

## 2018-04-24 DIAGNOSIS — I5032 Chronic diastolic (congestive) heart failure: Secondary | ICD-10-CM | POA: Insufficient documentation

## 2018-04-24 DIAGNOSIS — E1122 Type 2 diabetes mellitus with diabetic chronic kidney disease: Secondary | ICD-10-CM | POA: Insufficient documentation

## 2018-04-24 DIAGNOSIS — N400 Enlarged prostate without lower urinary tract symptoms: Secondary | ICD-10-CM | POA: Insufficient documentation

## 2018-04-24 DIAGNOSIS — Z7982 Long term (current) use of aspirin: Secondary | ICD-10-CM | POA: Insufficient documentation

## 2018-04-24 DIAGNOSIS — E113519 Type 2 diabetes mellitus with proliferative diabetic retinopathy with macular edema, unspecified eye: Secondary | ICD-10-CM | POA: Insufficient documentation

## 2018-04-24 DIAGNOSIS — E1121 Type 2 diabetes mellitus with diabetic nephropathy: Secondary | ICD-10-CM | POA: Insufficient documentation

## 2018-04-24 DIAGNOSIS — E1129 Type 2 diabetes mellitus with other diabetic kidney complication: Secondary | ICD-10-CM

## 2018-04-24 DIAGNOSIS — Z86718 Personal history of other venous thrombosis and embolism: Secondary | ICD-10-CM | POA: Insufficient documentation

## 2018-04-24 DIAGNOSIS — Z79899 Other long term (current) drug therapy: Secondary | ICD-10-CM | POA: Insufficient documentation

## 2018-04-24 DIAGNOSIS — E1140 Type 2 diabetes mellitus with diabetic neuropathy, unspecified: Secondary | ICD-10-CM | POA: Insufficient documentation

## 2018-04-24 DIAGNOSIS — N183 Chronic kidney disease, stage 3 unspecified: Secondary | ICD-10-CM

## 2018-04-24 DIAGNOSIS — I130 Hypertensive heart and chronic kidney disease with heart failure and stage 1 through stage 4 chronic kidney disease, or unspecified chronic kidney disease: Secondary | ICD-10-CM | POA: Insufficient documentation

## 2018-04-24 LAB — GLUCOSE, POCT (MANUAL RESULT ENTRY): POC Glucose: 77 mg/dl (ref 70–99)

## 2018-04-24 MED ORDER — CLONIDINE HCL 0.1 MG PO TABS
0.1000 mg | ORAL_TABLET | Freq: Once | ORAL | Status: AC
  Administered 2018-04-24: 0.1 mg via ORAL

## 2018-04-24 MED ORDER — ISOSORBIDE MONONITRATE ER 30 MG PO TB24: 30.0000 mg | ORAL_TABLET | Freq: Every day | ORAL | 3 refills | Status: DC

## 2018-04-24 MED FILL — TRUEPLUS PEN NDL 32GX5/32": 32G X 4 MM | 25 days supply | Qty: 100 | Fill #2

## 2018-04-24 MED FILL — TRUEPLUS PEN NDL 32GX5/32: 32G X 4 MM | 25 days supply | Qty: 100 | Fill #2

## 2018-04-24 MED FILL — GABAPENTIN 100 MG CAPSULE: 100 | 30 days supply | Qty: 90 | Fill #2

## 2018-04-24 MED FILL — ISOSORBIDE MN ER 30 MG TAB: 30 | 30 days supply | Qty: 30 | Fill #0

## 2018-04-24 NOTE — Progress Notes (Signed)
Subjective:  Patient ID: Joshua [name], male    DOB: [birth year]  Age: 67 y.o. MRN: [id number]  CC: Diabetes   HPI Joshua [name] is a 67 year old male with a history of type 2 diabetes mellitus (A1c 6.4), diabetic retinopathy, diabetic nephropathy, hypertension, BPH who presents today for follow-up visit. His blood pressure is significantly elevated today at 182/74  and he attributes this to being stressed out from having a flat tire on his way from [place] to [place] he had to get here with a rental car.  Lodine 0.1 mg administered. He also states he just took his antihypertensive this afternoon.  Denies chest pain, shortness of breath. His diabetes is diet controlled.  He does have diabetic neuropathy which is controlled on gabapentin. He had been referred to nephrology in 09/2017 by his PCP for stage III-IV chronic kidney disease however he never received an appointment. He has no acute concerns today.  Past Medical History:  Diagnosis Date  . Anasarca   . Cellulitis   . Convulsions ([place])   . Diabetic nephropathy with proteinuria ([place])   . Diastolic heart failure ([place])   . Difficulty in walking(719.7)   . Elevated alkaline phosphatase level   . GI bleed   . Hypertension 10/14/2012  . Hypoalbuminemia   . 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 Iron deficiency anemia   . Iron deficiency anemia, unspecified   . Microcytic anemia   . Muscle weakness (generalized)   . Nephrotic syndrome   . 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 TB (pulmonary tuberculosis) 09/24/2012   deemed non-infectious  . Type 2 diabetes mellitus ([place])   . Type II or unspecified type diabetes mellitus with renal manifestations, not stated as uncontrolled(250.40)   . Venous thrombosis and embolism FEB 2015   ACUTE DVTs, bilateral PERONEAL     Past Surgical History:  Procedure Laterality Date  . I&D EXTREMITY Left 01/17/2014   Procedure: IRRIGATION AND DEBRIDEMENT   LEFT FOOT;  Surgeon: Wylene Simmer, MD;  Location: [facility];  Service: Orthopedics;  Laterality: Left;  . KNEE ARTHROSCOPY  2010   3 times, right knee  . LEFT HEART CATHETERIZATION WITH CORONARY ANGIOGRAM N/A 02/02/2014   Procedure: LEFT HEART CATHETERIZATION WITH CORONARY ANGIOGRAM;  Surgeon: Burnell Blanks, MD;  Location: [facility] CATH LAB;  Service: Cardiovascular;  Laterality: N/A;  . TRANSMETATARSAL AMPUTATION Left 07/14/2014   Procedure: TRANSMETATARSAL AMPUTATION;  Surgeon: Wylene Simmer, MD;  Location: [facility];  Service: Orthopedics;  Laterality: Left;  percutaneous achilles tendon lengthening    No Known Allergies   Outpatient Medications Prior to Visit  Medication Sig Dispense Refill  . acetaminophen (TYLENOL 8 HOUR) 650 MG CR tablet Take 1 tablet (650 mg total) by mouth every 8 (eight) hours as needed for pain. 90 tablet 2  . aspirin EC 81 MG tablet Take 1 tablet (81 mg total) by mouth daily. 90 tablet 3  . fexofenadine (ALLEGRA ALLERGY) 180 MG tablet Take 1 tablet (180 mg total) by mouth daily.    . furosemide (LASIX) 20 MG tablet Take 1 tablet (20 mg total) by mouth daily. 1 tab PO Q Mon/Wed/Frid 90 tablet 3  . gabapentin (NEURONTIN) 100 MG capsule Take 1 capsule (100 mg total) by mouth 3 (three) times daily. 270 capsule 1  . Glucosamine-Chondroitin 750-600 MG TABS Take 2 tablets by mouth daily.  0  . hydrALAZINE (APRESOLINE) 50 MG tablet Take 1 tablet (50 mg total) by mouth 3 (three)  times daily. 270 tablet 3  . Insulin Glargine (LANTUS SOLOSTAR) 100 UNIT/ML Solostar Pen Inject 5 Units into the skin daily at 10 pm. 45 mL 3  . IRON, FERROUS GLUCONATE, PO Take 18 mg by mouth daily.    . isosorbide mononitrate (IMDUR) 30 MG 24 hr tablet Take 1 tablet (30 mg total) by mouth daily. 90 tablet 3  . losartan (COZAAR) 50 MG tablet Take 0.5 tablets (25 mg total) by mouth daily. 90 tablet 3  . OVER THE COUNTER MEDICATION Take 2 sprays by mouth daily. Reported on 01/02/2016    . Selenium Sulfide  2.25 % SHAM Apply 1 application topically daily. Apply to beard area, let dry then wash out 180 mL 1  . tamsulosin (FLOMAX) 0.4 MG CAPS capsule Take 1 capsule (0.4 mg total) by mouth daily after supper. 90 capsule 3  . TRUEPLUS PEN NEEDLES 32G X 4 MM MISC USE AS DIRECTED 100 each 3  . Turmeric 500 MG CAPS Take 500 mg by mouth daily.     No facility-administered medications prior to visit.     ROS Review of Systems  Constitutional: Negative for activity change and appetite change.  HENT: Negative for sinus pressure and sore throat.   Eyes: Negative for visual disturbance.  Respiratory: Negative for cough, chest tightness and shortness of breath.   Cardiovascular: Negative for chest pain and leg swelling.  Gastrointestinal: Negative for abdominal distention, abdominal pain, constipation and diarrhea.  Endocrine: Negative.   Genitourinary: Negative for dysuria.  Musculoskeletal: Negative for joint swelling and myalgias.  Skin: Negative for rash.  Allergic/Immunologic: Negative.   Neurological: Negative for weakness, light-headedness and numbness.  Psychiatric/Behavioral: Negative for dysphoric mood and suicidal ideas.    Objective:  BP (!) 182/74   Pulse 73   Temp (!) 97.3 F (36.3 C) (Oral)   Ht 5' 4.5" (1.638 m)   Wt 155 lb 6.4 oz (70.5 kg)   SpO2 98%   BMI 26.26 kg/m   BP/Weight 04/24/2018 01/22/2018 81/19/1478  Systolic BP 295 621 308  Diastolic BP 74 96 92  Wt. (Lbs) 155.4 151.2 164  BMI 26.26 25.55 28.15      Physical Exam  Constitutional: He is oriented to person, place, and time. He appears well-developed and well-nourished.  Cardiovascular: Normal rate, normal heart sounds and intact distal pulses.  No murmur heard. Pulmonary/Chest: Effort normal and breath sounds normal. He has no wheezes. He has no rales. He exhibits no tenderness.  Abdominal: Soft. Bowel sounds are normal. He exhibits no distension and no mass. There is no tenderness.  Musculoskeletal: Normal  range of motion.  Neurological: He is alert and oriented to person, place, and time.  Skin: Skin is warm and dry.  Psychiatric: He has a normal mood and affect.     CMP Latest Ref Rng & Units 01/22/2018 10/23/2017 03/24/2017  Glucose 65 - 99 mg/dL 122(H) 73 105(H)  BUN 8 - 27 mg/dL 43(H) 42(H) 45(H)  Creatinine 0.76 - 1.27 mg/dL 2.94(H) 3.29(H) 2.59(H)  Sodium 134 - 144 mmol/L 138 145(H) 142  Potassium 3.5 - 5.2 mmol/L 4.8 4.5 5.9(H)  Chloride 96 - 106 mmol/L 105 109(H) 105  CO2 20 - 29 mmol/L 19(L) 21 22  Calcium 8.6 - 10.2 mg/dL 8.9 8.5(L) 8.9  Total Protein 6.0 - 8.5 g/dL - 6.9 7.0  Total Bilirubin 0.0 - 1.2 mg/dL - 0.2 0.2  Alkaline Phos 39 - 117 IU/L - 99 93  AST 0 - 40 IU/L - 20  25  ALT 0 - 44 IU/L - 15 22    Lab Results  Component Value Date   HGBA1C 6.4 01/26/2018    Assessment & Plan:   1. Type 2 diabetes mellitus with other kidney complication, unspecified whether long term insulin use (HCC) With diabetic retinopathy and diabetic nephropathy Good glycemic control with last A1c of 6.4 Continue diet control, lifestyle modifications - POCT glucose (manual entry)  2. Essential hypertension Uncontrolled Clonidine 0.1 mg administered in the clinic He attributes elevation due to being stressed this afternoon and the fact that he just took his antihypertensives Return at next visit for reassessment of blood pressure and need to titrate up his medications - cloNIDine (CATAPRES) tablet 0.1 mg  3. CKD (chronic kidney disease) stage 3, GFR 30-59 ml/min (HCC) Diabetic and hypertensive nephropathy Referred to nephrology in 09/2017 by PCP We will check renal function today and if still trending all will place referral again Avoid nephrotoxins - CMP14+EGFR  4. 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 cloNIDine (CATAPRES) tablet 0.1 mg     Follow-up: Return in about 6 weeks (around 06/05/2018) for follow up of hypertension with PCP-Dr. Wynetta Emery.   Charlott Rakes MD

## 2018-04-24 NOTE — Patient Instructions (Signed)
Chronic Kidney Disease, Adult Chronic kidney disease (CKD) happens when the kidneys are damaged during a time of 3 or more months. The kidneys are two organs that do many important jobs in the body. These jobs include:  Removing wastes and extra fluids from the blood.  Making hormones that maintain the amount of fluid in your tissues and blood vessels.  Making sure that the body has the right amount of fluids and chemicals.  Most of the time, this condition does not go away, but it can usually be controlled. Steps must be taken to slow down the kidney damage or stop it from getting worse. Otherwise, the kidneys may stop working. Follow these instructions at home:  Follow your diet as told by your doctor. You may need to avoid alcohol, salty foods (sodium), and foods that are high in potassium, calcium, and protein.  Take over-the-counter and prescription medicines only as told by your doctor. Do not take any new medicines unless your doctor says you can do that. These include vitamins and minerals. ? Medicines and nutritional supplements can make kidney damage worse. ? Your doctor may need to change how much medicine you take.  Do not use any tobacco products. These include cigarettes, chewing tobacco, and e-cigarettes. If you need help quitting, ask your doctor.  Keep all follow-up visits as told by your doctor. This is important.  Check your blood pressure. Tell your doctor if there are changes to your blood pressure.  Get to a healthy weight. Stay at that weight. If you need help with this, ask your doctor.  Start or continue an exercise plan. Try to exercise at least 30 minutes a day, 5 days a week.  Stay up-to-date with your shots (immunizations) as told by your doctor. Contact a doctor if:  Your symptoms get worse.  You have new symptoms. Get help right away if:  You have symptoms of end-stage kidney disease. These include: ? Headaches. ? Skin that is darker or lighter  than normal. ? Numbness in your hands or feet. ? Easy bruising. ? Having hiccups often. ? Chest pain. ? Shortness of breath. ? Stopping of menstrual periods in women.  You have a fever.  You are making very little pee (urine).  You have pain or bleeding when you pee (urinate). This information is not intended to replace advice given to you by your health care provider. Make sure you discuss any questions you have with your health care provider. Document Released: 02/05/2010 Document Revised: 04/18/2016 Document Reviewed: 07/10/2012 Elsevier Interactive Patient Education  2017 Elsevier Inc.  

## 2018-04-25 LAB — CMP14+EGFR
ALT: 26 IU/L (ref 0–44)
AST: 25 IU/L (ref 0–40)
Albumin/Globulin Ratio: 1.1 — ABNORMAL LOW (ref 1.2–2.2)
Albumin: 4 g/dL (ref 3.6–4.8)
Alkaline Phosphatase: 112 IU/L (ref 39–117)
BUN/Creatinine Ratio: 14 (ref 10–24)
BUN: 44 mg/dL — AB (ref 8–27)
Bilirubin Total: 0.3 mg/dL (ref 0.0–1.2)
CALCIUM: 8.6 mg/dL (ref 8.6–10.2)
CO2: 20 mmol/L (ref 20–29)
CREATININE: 3.13 mg/dL — AB (ref 0.76–1.27)
Chloride: 105 mmol/L (ref 96–106)
GFR calc non Af Amer: 20 mL/min/{1.73_m2} — ABNORMAL LOW (ref >59)
GFR, EST AFRICAN AMERICAN: 23 mL/min/{1.73_m2} — AB (ref >59)
GLUCOSE: 81 mg/dL (ref 65–99)
Globulin, Total: 3.5 g/dL (ref 1.5–4.5)
Potassium: 4.9 mmol/L (ref 3.5–5.2)
Sodium: 139 mmol/L (ref 134–144)
TOTAL PROTEIN: 7.5 g/dL (ref 6.0–8.5)

## 2018-04-27 ENCOUNTER — Other Ambulatory Visit: Payer: Self-pay | Admitting: Family Medicine

## 2018-04-27 ENCOUNTER — Telehealth: Payer: Self-pay | Admitting: Internal Medicine

## 2018-04-27 DIAGNOSIS — N183 Chronic kidney disease, stage 3 unspecified: Secondary | ICD-10-CM

## 2018-04-27 NOTE — Telephone Encounter (Signed)
Patient call wanting to speak with nurse about some medication. Please follow up

## 2018-04-29 ENCOUNTER — Telehealth: Payer: Self-pay

## 2018-04-29 NOTE — Telephone Encounter (Signed)
Patient was called and informed of lab results and referral.

## 2018-06-03 NOTE — Telephone Encounter (Signed)
Please Close Encounter as it is still sitting in my in basket as an open encounter.   Thank you Emmit Pomfret

## 2018-06-09 MED FILL — FUROSEMIDE 20 MG TABLET: 20 | 30 days supply | Qty: 12 | Fill #3

## 2018-06-09 MED FILL — ISOSORBIDE MN ER 30 MG TAB: 30 | 30 days supply | Qty: 30 | Fill #1

## 2018-06-09 MED FILL — GABAPENTIN 100 MG CAPSULE: 100 | 30 days supply | Qty: 90 | Fill #3

## 2018-06-09 MED FILL — hydrALAZINE HCL 50 MG TABS: 50 | 30 days supply | Qty: 90 | Fill #1

## 2018-06-19 ENCOUNTER — Ambulatory Visit: Payer: Self-pay | Admitting: Internal Medicine

## 2018-06-19 ENCOUNTER — Ambulatory Visit: Payer: Self-pay

## 2018-07-01 MED FILL — FUROSEMIDE 20 MG TABLET: 20 | 30 days supply | Qty: 12 | Fill #4

## 2018-07-03 ENCOUNTER — Encounter: Payer: Self-pay | Admitting: Internal Medicine

## 2018-07-03 ENCOUNTER — Ambulatory Visit: Payer: Self-pay | Admitting: Internal Medicine

## 2018-07-03 ENCOUNTER — Ambulatory Visit: Payer: Self-pay | Attending: Internal Medicine | Admitting: Internal Medicine

## 2018-07-03 ENCOUNTER — Ambulatory Visit: Payer: Self-pay

## 2018-07-03 VITALS — BP 129/73 | HR 71 | Temp 98.4°F | Resp 16 | Wt 157.8 lb

## 2018-07-03 DIAGNOSIS — Z79899 Other long term (current) drug therapy: Secondary | ICD-10-CM | POA: Insufficient documentation

## 2018-07-03 DIAGNOSIS — Z86718 Personal history of other venous thrombosis and embolism: Secondary | ICD-10-CM | POA: Insufficient documentation

## 2018-07-03 DIAGNOSIS — Z794 Long term (current) use of insulin: Secondary | ICD-10-CM | POA: Insufficient documentation

## 2018-07-03 DIAGNOSIS — Z87891 Personal history of nicotine dependence: Secondary | ICD-10-CM | POA: Insufficient documentation

## 2018-07-03 DIAGNOSIS — I10 Essential (primary) hypertension: Secondary | ICD-10-CM

## 2018-07-03 DIAGNOSIS — E11311 Type 2 diabetes mellitus with unspecified diabetic retinopathy with macular edema: Secondary | ICD-10-CM | POA: Insufficient documentation

## 2018-07-03 DIAGNOSIS — E1121 Type 2 diabetes mellitus with diabetic nephropathy: Secondary | ICD-10-CM | POA: Insufficient documentation

## 2018-07-03 DIAGNOSIS — Z7982 Long term (current) use of aspirin: Secondary | ICD-10-CM | POA: Insufficient documentation

## 2018-07-03 DIAGNOSIS — M7989 Other specified soft tissue disorders: Secondary | ICD-10-CM | POA: Insufficient documentation

## 2018-07-03 DIAGNOSIS — I872 Venous insufficiency (chronic) (peripheral): Secondary | ICD-10-CM | POA: Insufficient documentation

## 2018-07-03 DIAGNOSIS — I5032 Chronic diastolic (congestive) heart failure: Secondary | ICD-10-CM | POA: Insufficient documentation

## 2018-07-03 DIAGNOSIS — I130 Hypertensive heart and chronic kidney disease with heart failure and stage 1 through stage 4 chronic kidney disease, or unspecified chronic kidney disease: Secondary | ICD-10-CM | POA: Insufficient documentation

## 2018-07-03 DIAGNOSIS — N184 Chronic kidney disease, stage 4 (severe): Secondary | ICD-10-CM | POA: Insufficient documentation

## 2018-07-03 DIAGNOSIS — N400 Enlarged prostate without lower urinary tract symptoms: Secondary | ICD-10-CM | POA: Insufficient documentation

## 2018-07-03 DIAGNOSIS — Z89432 Acquired absence of left foot: Secondary | ICD-10-CM | POA: Insufficient documentation

## 2018-07-03 DIAGNOSIS — I2510 Atherosclerotic heart disease of native coronary artery without angina pectoris: Secondary | ICD-10-CM | POA: Insufficient documentation

## 2018-07-03 DIAGNOSIS — E1140 Type 2 diabetes mellitus with diabetic neuropathy, unspecified: Secondary | ICD-10-CM | POA: Insufficient documentation

## 2018-07-03 DIAGNOSIS — E1122 Type 2 diabetes mellitus with diabetic chronic kidney disease: Secondary | ICD-10-CM | POA: Insufficient documentation

## 2018-07-03 LAB — POCT GLYCOSYLATED HEMOGLOBIN (HGB A1C): HEMOGLOBIN A1C: 5.2 % (ref 4.0–5.6)

## 2018-07-03 LAB — GLUCOSE, POCT (MANUAL RESULT ENTRY): POC GLUCOSE: 120 mg/dL — AB (ref 70–99)

## 2018-07-03 NOTE — Progress Notes (Signed)
Patient ID: Joshua [name], male    DOB: 1950/12/30  MRN: [id number]  CC: Medication Management   Subjective: Joshua [name] is a 67 y.o. male who presents for chronic ds management His concerns today include:  Patient with history of HTN, CKD stage IV, BPH, DM with neuropathy,nephropathy and retinopathy, ACD, chronic diastolic CHF, s/p Lt transmetatarsal amputation  CKD/HTN: stage 4:  Compliant with meds. His [facility] discount card expired but neither accepted by nephrologist here in town -makes good urine Swelling in legs dec with Furosemide 3 x a wk that was added on last visit Limits salt. -no CP/SOB  DM: on Lantus 5 units a day.  Does not know how to check his BS and does not want to do it himself Doing okay with eating habits Over due for eye exam Denies having any ulcers or calluses on the feet.   Patient Active Problem List   Diagnosis Date Noted  . Hyperkalemia 03/25/2017  . Seborrhea 09/16/2016  . Elevated PSA 01/04/2016  . Anemia 01/04/2016  . Rhinitis, allergic 01/02/2016  . Daytime somnolence 12/05/2015  . BPH (benign prostatic hyperplasia) 12/05/2015  . 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 Mild CAD 06/14/2015  . CKD (chronic kidney disease) stage 3, GFR 30-59 ml/min (HCC) 06/14/2015  . Inguinal pain 05/10/2014  . Chest pain 04/13/2014  . Anxiety state, unspecified 03/06/2014  . Nephrotic syndrome 01/14/2014  . Elevated alkaline phosphatase level 01/11/2014  . History of DVT of lower extremity 01/11/2014  . Chronic diastolic heart failure ([place]) 01/11/2014  . New onset seizure ([place]) 01/07/2014  . Chronic leg pain 01/05/2014  . Diabetic neuropathy 03/22/2013  . Abnormality of gait 02/27/2013  . Diabetic macular edema ([place]) 12/22/2012  . Diabetic proliferative retinopathy ([place]) 12/22/2012  . Intragel vitreous hemorrhage ([place]) 12/22/2012  . Diabetes mellitus with renal manifestation ([place]) 10/15/2012  . Hypoalbuminemia  10/15/2012  . Diabetic nephropathy with proteinuria ([place]) 10/15/2012  . High risk sexual behavior 10/14/2012  . Hypertension 10/14/2012  . Chronic venous insufficiency 10/07/2012     Current Outpatient Medications on File Prior to Visit  Medication Sig Dispense Refill  . acetaminophen (TYLENOL 8 HOUR) 650 MG CR tablet Take 1 tablet (650 mg total) by mouth every 8 (eight) hours as needed for pain. 90 tablet 2  . aspirin EC 81 MG tablet Take 1 tablet (81 mg total) by mouth daily. 90 tablet 3  . fexofenadine (ALLEGRA ALLERGY) 180 MG tablet Take 1 tablet (180 mg total) by mouth daily.    . furosemide (LASIX) 20 MG tablet Take 1 tablet (20 mg total) by mouth daily. 1 tab PO Q Mon/Wed/Frid 90 tablet 3  . gabapentin (NEURONTIN) 100 MG capsule Take 1 capsule (100 mg total) by mouth 3 (three) times daily. 270 capsule 1  . Glucosamine-Chondroitin 750-600 MG TABS Take 2 tablets by mouth daily.  0  . hydrALAZINE (APRESOLINE) 50 MG tablet Take 1 tablet (50 mg total) by mouth 3 (three) times daily. 270 tablet 3  . Insulin Glargine (LANTUS SOLOSTAR) 100 UNIT/ML Solostar Pen Inject 5 Units into the skin daily at 10 pm. 45 mL 3  . IRON, FERROUS GLUCONATE, PO Take 18 mg by mouth daily.    . isosorbide mononitrate (IMDUR) 30 MG 24 hr tablet Take 1 tablet (30 mg total) by mouth daily. 90 tablet 3  . losartan (COZAAR) 50 MG tablet Take 0.5 tablets (25 mg total) by mouth daily. 90 tablet 3  .  OVER THE COUNTER MEDICATION Take 2 sprays by mouth daily. Reported on 01/02/2016    . Selenium Sulfide 2.25 % SHAM Apply 1 application topically daily. Apply to beard area, let dry then wash out 180 mL 1  . tamsulosin (FLOMAX) 0.4 MG CAPS capsule Take 1 capsule (0.4 mg total) by mouth daily after supper. 90 capsule 3  . TRUEPLUS PEN NEEDLES 32G X 4 MM MISC USE AS DIRECTED 100 each 3  . Turmeric 500 MG CAPS Take 500 mg by mouth daily.     No current facility-administered medications on file prior to visit.     No Known  Allergies  Social History   Socioeconomic History  . Marital status: Single    Spouse name: Not on file  . Number of children: 5  . Years of education: Masters  . Highest education level: Not on file  Occupational History  . Occupation: Surveyor, quantity: [employer]  . Financial resource strain: Not on file  . Food insecurity:    Worry: Not on file    Inability: Not on file  . Transportation needs:    Medical: Not on file    Non-medical: Not on file  Tobacco Use  . Smoking status: Former Smoker    Years: 20.00    Types: Cigarettes    Last attempt to quit: 11/25/1992    Years since quitting: 25.6  . Smokeless tobacco: Never Used  Substance and Sexual Activity  . Alcohol use: No    Alcohol/week: 15.0 standard drinks    Types: 15 Standard drinks or equivalent per week    Comment: Rarely.  . Drug use: No  . Sexual activity: Not on file    Comment: 09/2012: > 15 partners in past 3 years, inconsistent condom use  Lifestyle  . Physical activity:    Days per week: Not on file    Minutes per session: Not on file  . Stress: Not on file  Relationships  . Social connections:    Talks on phone: Not on file    Gets together: Not on file    Attends religious service: Not on file    Active member of club or organization: Not on file    Attends meetings of clubs or organizations: Not on file    Relationship status: Not on file  . Intimate partner violence:    Fear of current or ex partner: Not on file    Emotionally abused: Not on file    Physically abused: Not on file    Forced sexual activity: Not on file  Other Topics Concern  . Not on file  Social History Narrative   Has a masters degree in education    Family History  Problem Relation Age of Onset  . GI problems Unknown        none    Past Surgical History:  Procedure Laterality Date  . I&D EXTREMITY Left 01/17/2014   Procedure: IRRIGATION AND DEBRIDEMENT  LEFT FOOT;  Surgeon: Wylene Simmer, MD;   Location: [facility];  Service: Orthopedics;  Laterality: Left;  . KNEE ARTHROSCOPY  2010   3 times, right knee  . LEFT HEART CATHETERIZATION WITH CORONARY ANGIOGRAM N/A 02/02/2014   Procedure: LEFT HEART CATHETERIZATION WITH CORONARY ANGIOGRAM;  Surgeon: Burnell Blanks, MD;  Location: [facility] CATH LAB;  Service: Cardiovascular;  Laterality: N/A;  . TRANSMETATARSAL AMPUTATION Left 07/14/2014   Procedure: TRANSMETATARSAL AMPUTATION;  Surgeon: Wylene Simmer, MD;  Location: [facility];  Service: Orthopedics;  Laterality: Left;  percutaneous achilles tendon lengthening    ROS: Review of Systems Neg except as above  PHYSICAL EXAM: BP 129/73   Pulse 71   Temp 98.4 F (36.9 C) (Oral)   Resp 16   Wt 157 lb 12.8 oz (71.6 kg)   SpO2 96%   BMI 26.67 kg/m   Physical Exam General appearance - alert, well appearing, and in no distress Mental status - normal mood, behavior, speech, dress, motor activity, and thought processes Eyes - pupils equal and reactive, extraocular eye movements intact Neck - supple, no significant adenopathy Chest - clear to auscultation, no wheezes, rales or rhonchi, symmetric air entry Heart - normal rate, regular rhythm, normal S1, S2, no murmurs, rubs, clicks or gallops Extremities - trace LE edema  Results for orders placed or performed in visit on 07/03/18  POCT glucose (manual entry)  Result Value Ref Range   POC Glucose 120 (A) 70 - 99 mg/dl  POCT glycosylated hemoglobin (Hb A1C)  Result Value Ref Range   Hemoglobin A1C 5.2 4.0 - 5.6 %   HbA1c POC (<> result, manual entry)     HbA1c, POC (prediabetic range)     HbA1c, POC (controlled diabetic range)     Lab Results  Component Value Date   HGBA1C 5.2 07/03/2018     Chemistry      Component Value Date/Time   NA 139 04/24/2018 1703   K 4.9 04/24/2018 1703   CL 105 04/24/2018 1703   CO2 20 04/24/2018 1703   BUN 44 (H) 04/24/2018 1703   CREATININE 3.13 (H) 04/24/2018 1703   CREATININE 2.78 (H) 12/10/2016 1525        Component Value Date/Time   CALCIUM 8.6 04/24/2018 1703   ALKPHOS 112 04/24/2018 1703   AST 25 04/24/2018 1703   ALT 26 04/24/2018 1703   BILITOT 0.3 04/24/2018 1703       ASSESSMENT AND PLAN: 1. Type 2 diabetes mellitus with chronic kidney disease, with long-term current use of insulin, unspecified CKD stage ([place]) At goal.  Continue Lantus healthy eating habits Advised to call and schedule an eye appointment with any optometrist or ophthalmologist. - POCT glucose (manual entry) - POCT glycosylated hemoglobin (Hb A1C) - Microalbumin / creatinine urine ratio  2. Essential hypertension At goal.  Continue current medications  3. CKD stage 4 due to type 2 diabetes mellitus ([place]) Needs to see a nephrologist but unable to afford.  He is hoping to get private insurance by the end of the year. Advised to avoid NSAIDs  Patient was given the opportunity to ask questions.  Patient verbalized understanding of the plan and was able to repeat key elements of the plan.   Orders Placed This Encounter  Procedures  . Microalbumin / creatinine urine ratio  . POCT glucose (manual entry)  . POCT glycosylated hemoglobin (Hb A1C)     Requested Prescriptions    No prescriptions requested or ordered in this encounter    Return in about 3 months (around 10/03/2018).  Karle Plumber, MD, FACP

## 2018-07-03 NOTE — Patient Instructions (Signed)
Stop at the front desk and ask them to reschedule your appointment with our financial specialist, Clifton James.

## 2018-07-04 LAB — MICROALBUMIN / CREATININE URINE RATIO
Creatinine, Urine: 82.5 mg/dL
MICROALB/CREAT RATIO: 1644.6 mg/g{creat} — AB (ref 0.0–30.0)
MICROALBUM., U, RANDOM: 1356.8 ug/mL

## 2018-07-08 ENCOUNTER — Telehealth: Payer: Self-pay

## 2018-07-08 NOTE — Telephone Encounter (Signed)
Contacted pt to go over urine results pt is aware and doesn't have any questions or concerns  

## 2018-07-16 ENCOUNTER — Other Ambulatory Visit: Payer: Self-pay | Admitting: Internal Medicine

## 2018-07-16 DIAGNOSIS — E1142 Type 2 diabetes mellitus with diabetic polyneuropathy: Secondary | ICD-10-CM

## 2018-07-16 MED FILL — LOSARTAN POTASSIUM 50 MG TA: 50 | 30 days supply | Qty: 60 | Fill #1

## 2018-07-16 MED FILL — ISOSORBIDE MN ER 30 MG TAB: 30 | 30 days supply | Qty: 30 | Fill #2

## 2018-07-16 MED FILL — hydrALAZINE HCL 50 MG TABS: 50 | 30 days supply | Qty: 90 | Fill #2

## 2018-07-17 ENCOUNTER — Other Ambulatory Visit: Payer: Self-pay

## 2018-07-17 MED ORDER — INSULIN PEN NEEDLE 32G X 4 MM MISC: 3 refills | Status: AC

## 2018-07-17 MED FILL — TRUEPLUS PEN NDL 32GX5/32: 32G X 4 MM | 30 days supply | Qty: 100 | Fill #0

## 2018-07-17 MED FILL — TRUEPLUS PEN NDL 32GX5/32": 32G X 4 MM | 30 days supply | Qty: 100 | Fill #0

## 2018-07-17 MED FILL — GABAPENTIN 100 MG CAPSULE: 100 | 30 days supply | Qty: 90 | Fill #0

## 2018-07-20 ENCOUNTER — Ambulatory Visit: Payer: Self-pay | Attending: Internal Medicine

## 2018-07-20 MED FILL — FUROSEMIDE 20 MG TABLET: 20 | 30 days supply | Qty: 12 | Fill #5

## 2018-08-07 ENCOUNTER — Ambulatory Visit: Payer: Self-pay | Admitting: Internal Medicine

## 2018-09-09 ENCOUNTER — Ambulatory Visit: Payer: Self-pay

## 2018-09-21 MED FILL — ISOSORBIDE MN ER 30 MG TAB: 30 | 30 days supply | Qty: 30 | Fill #4

## 2018-09-21 MED FILL — hydrALAZINE HCL 50 MG TABS: 50 | 30 days supply | Qty: 90 | Fill #4

## 2018-09-21 MED FILL — GABAPENTIN 100 MG CAPSULE: 100 | 30 days supply | Qty: 90 | Fill #2

## 2018-09-23 ENCOUNTER — Ambulatory Visit: Payer: Self-pay | Attending: Internal Medicine

## 2018-10-08 ENCOUNTER — Ambulatory Visit: Payer: Self-pay | Admitting: Internal Medicine

## 2018-10-15 ENCOUNTER — Ambulatory Visit: Payer: Self-pay | Admitting: Internal Medicine

## 2018-10-16 ENCOUNTER — Ambulatory Visit: Payer: Self-pay | Attending: Internal Medicine | Admitting: Physician Assistant

## 2018-10-16 VITALS — BP 173/84 | HR 84 | Temp 98.1°F | Ht 64.5 in | Wt 173.0 lb

## 2018-10-16 DIAGNOSIS — I2510 Atherosclerotic heart disease of native coronary artery without angina pectoris: Secondary | ICD-10-CM | POA: Insufficient documentation

## 2018-10-16 DIAGNOSIS — N400 Enlarged prostate without lower urinary tract symptoms: Secondary | ICD-10-CM | POA: Insufficient documentation

## 2018-10-16 DIAGNOSIS — Z79899 Other long term (current) drug therapy: Secondary | ICD-10-CM | POA: Insufficient documentation

## 2018-10-16 DIAGNOSIS — I10 Essential (primary) hypertension: Secondary | ICD-10-CM

## 2018-10-16 DIAGNOSIS — N184 Chronic kidney disease, stage 4 (severe): Secondary | ICD-10-CM | POA: Insufficient documentation

## 2018-10-16 DIAGNOSIS — E1140 Type 2 diabetes mellitus with diabetic neuropathy, unspecified: Secondary | ICD-10-CM | POA: Insufficient documentation

## 2018-10-16 DIAGNOSIS — R748 Abnormal levels of other serum enzymes: Secondary | ICD-10-CM | POA: Insufficient documentation

## 2018-10-16 DIAGNOSIS — A159 Respiratory tuberculosis unspecified: Secondary | ICD-10-CM | POA: Insufficient documentation

## 2018-10-16 DIAGNOSIS — R600 Localized edema: Secondary | ICD-10-CM

## 2018-10-16 DIAGNOSIS — E1122 Type 2 diabetes mellitus with diabetic chronic kidney disease: Secondary | ICD-10-CM | POA: Insufficient documentation

## 2018-10-16 DIAGNOSIS — I130 Hypertensive heart and chronic kidney disease with heart failure and stage 1 through stage 4 chronic kidney disease, or unspecified chronic kidney disease: Secondary | ICD-10-CM | POA: Insufficient documentation

## 2018-10-16 DIAGNOSIS — R601 Generalized edema: Secondary | ICD-10-CM | POA: Insufficient documentation

## 2018-10-16 DIAGNOSIS — E1142 Type 2 diabetes mellitus with diabetic polyneuropathy: Secondary | ICD-10-CM

## 2018-10-16 DIAGNOSIS — E785 Hyperlipidemia, unspecified: Secondary | ICD-10-CM | POA: Insufficient documentation

## 2018-10-16 DIAGNOSIS — Z7982 Long term (current) use of aspirin: Secondary | ICD-10-CM | POA: Insufficient documentation

## 2018-10-16 DIAGNOSIS — Z794 Long term (current) use of insulin: Secondary | ICD-10-CM | POA: Insufficient documentation

## 2018-10-16 DIAGNOSIS — I5032 Chronic diastolic (congestive) heart failure: Secondary | ICD-10-CM | POA: Insufficient documentation

## 2018-10-16 DIAGNOSIS — Z86718 Personal history of other venous thrombosis and embolism: Secondary | ICD-10-CM | POA: Insufficient documentation

## 2018-10-16 DIAGNOSIS — L89891 Pressure ulcer of other site, stage 1: Secondary | ICD-10-CM | POA: Insufficient documentation

## 2018-10-16 LAB — GLUCOSE, POCT (MANUAL RESULT ENTRY): POC GLUCOSE: 130 mg/dL — AB (ref 70–99)

## 2018-10-16 MED ORDER — INSULIN GLARGINE 100 UNIT/ML SOLOSTAR PEN: 5.0000 [IU] | PEN_INJECTOR | Freq: Every day | SUBCUTANEOUS | 3 refills | Status: AC

## 2018-10-16 MED ORDER — FUROSEMIDE 20 MG PO TABS: 20.0000 mg | ORAL_TABLET | Freq: Every day | ORAL | 3 refills | Status: DC

## 2018-10-16 MED ORDER — ASPIRIN EC 81 MG PO TBEC: 81.0000 mg | DELAYED_RELEASE_TABLET | Freq: Every day | ORAL | 3 refills | Status: AC

## 2018-10-16 MED ORDER — TAMSULOSIN HCL 0.4 MG PO CAPS: 0.4000 mg | ORAL_CAPSULE | Freq: Every day | ORAL | 3 refills | Status: AC

## 2018-10-16 MED ORDER — ISOSORBIDE MONONITRATE ER 30 MG PO TB24: 30.0000 mg | ORAL_TABLET | Freq: Every day | ORAL | 3 refills | Status: AC

## 2018-10-16 MED ORDER — LOSARTAN POTASSIUM 50 MG PO TABS: 25.0000 mg | ORAL_TABLET | Freq: Every day | ORAL | 3 refills | Status: DC

## 2018-10-16 MED ORDER — GABAPENTIN 100 MG PO CAPS: 100.0000 mg | ORAL_CAPSULE | Freq: Three times a day (TID) | ORAL | 2 refills | Status: AC

## 2018-10-16 MED FILL — TAMSULOSIN HCL 0.4 MG CAP: 0.4 | 90 days supply | Qty: 90 | Fill #0

## 2018-10-16 MED FILL — LOSARTAN POTASSIUM 50 MG TA: 50 | 90 days supply | Qty: 45 | Fill #0

## 2018-10-16 MED FILL — !LANTUS SOLOSTAR 100UNITS/M: 100 | 28 days supply | Qty: 3 | Fill #0

## 2018-10-16 MED FILL — FUROSEMIDE 20 MG TABLET: 20 | 90 days supply | Qty: 90 | Fill #0

## 2018-10-16 MED FILL — ISOSORBIDE MN ER 30 MG TAB: 30 | 90 days supply | Qty: 90 | Fill #0

## 2018-10-16 NOTE — Progress Notes (Signed)
Patient ID: Joshua [name], male   DOB: Feb 11, 1951, 67 y.o.   MRN: [id number]     Joshua [name], is a 67 y.o. male  WPY:[id number]  KNL:[id number]  DOB - 18-Jun-1951  Subjective:  Chief Complaint and HPI: Joshua [name] is a 67 y.o. male here today with pressure sore under L foot stump. Present for a few weeks.  He used to use a razor and shave it down but has been advised not tot do that.  Has pain with weight bearing.  Also has dry skin on heels on B feet.  Needs 3 month supply of all meds bc going to be out of town more than 1 month for the holidays.  He is out of BP meds and hasn't taken them in several days.  He denies CP/HA/dizziness.  Says blood sugar has been <150s and he is compliant with meds.     ROS:   Constitutional:  No f/c, No night sweats, No unexplained weight loss. EENT:  No vision changes, No blurry vision, No hearing changes. No mouth, throat, or ear problems.  Respiratory: No cough, No SOB Cardiac: No CP, no palpitations GI:  No abd pain, No N/V/D. GU: No Urinary s/sx Musculoskeletal: No joint pain Neuro: No headache, no dizziness, no motor weakness.  Skin: No rash Endocrine:  No polydipsia. No polyuria.  Psych: Denies SI/HI  No problems updated.  ALLERGIES: No Known Allergies  PAST MEDICAL HISTORY: Past Medical History:  Diagnosis Date  . Anasarca   . Cellulitis   . Convulsions ([place])   . Diabetic nephropathy with proteinuria ([place])   . Diastolic heart failure ([place])   . Difficulty in walking(719.7)   . Elevated alkaline phosphatase level   . GI bleed   . Hypertension 10/14/2012  . Hypoalbuminemia   . 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 Iron deficiency anemia   . Iron deficiency anemia, unspecified   . Microcytic anemia   . Muscle weakness (generalized)   . Nephrotic syndrome   . 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 TB (pulmonary tuberculosis) 09/24/2012   deemed non-infectious  . Type 2 diabetes mellitus ([place])     . Type II or unspecified type diabetes mellitus with renal manifestations, not stated as uncontrolled(250.40)   . Venous thrombosis and embolism FEB 2015   ACUTE DVTs, bilateral PERONEAL     MEDICATIONS AT HOME: Prior to Admission medications   Medication Sig Start Date End Date Taking? Authorizing Provider  acetaminophen (TYLENOL 8 HOUR) 650 MG CR tablet Take 1 tablet (650 mg total) by mouth every 8 (eight) hours as needed for pain. 12/10/16  Yes Funches, Adriana Mccallum, MD  aspirin EC 81 MG tablet Take 1 tablet (81 mg total) by mouth daily. 10/16/18  Yes ,  M, PA-C  fexofenadine (ALLEGRA ALLERGY) 180 MG tablet Take 1 tablet (180 mg total) by mouth daily. 11/21/16  Yes Funches, Josalyn, MD  furosemide (LASIX) 20 MG tablet Take 1 tablet (20 mg total) by mouth daily. 1 tab PO Q Mon/Wed/Frid 10/16/18  Yes ,  M, PA-C  gabapentin (NEURONTIN) 100 MG capsule Take 1 capsule (100 mg total) by mouth 3 (three) times daily. 10/16/18  Yes Argentina Donovan, PA-C  Glucosamine-Chondroitin 750-600 MG TABS Take 2 tablets by mouth daily. 03/24/17  Yes Funches, Josalyn, MD  hydrALAZINE (APRESOLINE) 50 MG tablet Take 1 tablet (50 mg total) by mouth 3 (three) times daily. 10/23/17  Yes Ladell Pier, MD  Insulin Glargine (LANTUS SOLOSTAR)  100 UNIT/ML Solostar Pen Inject 5 Units into the skin daily at 10 pm. 10/16/18  Yes , Dionne Bucy, PA-C  Insulin Pen Needle (TRUEPLUS PEN NEEDLES) 32G X 4 MM MISC USE AS DIRECTED ONCE DAILY TO ADMINISTER INSULIN 07/17/18  Yes Ladell Pier, MD  IRON, FERROUS GLUCONATE, PO Take 18 mg by mouth daily.   Yes [provider]  isosorbide mononitrate (IMDUR) 30 MG 24 hr tablet Take 1 tablet (30 mg total) by mouth daily. 10/16/18  Yes Freeman Caldron M, PA-C  losartan (COZAAR) 50 MG tablet Take 0.5 tablets (25 mg total) by mouth daily. 10/16/18  Yes , Dionne Bucy, PA-C  OVER THE COUNTER MEDICATION Take 2 sprays by mouth daily. Reported on  01/02/2016   Yes [provider]  Selenium Sulfide 2.25 % SHAM Apply 1 application topically daily. Apply to beard area, let dry then wash out 09/16/16  Yes Funches, Josalyn, MD  tamsulosin (FLOMAX) 0.4 MG CAPS capsule Take 1 capsule (0.4 mg total) by mouth daily after supper. 10/16/18  Yes ,  M, PA-C  Turmeric 500 MG CAPS Take 500 mg by mouth daily. 03/24/17  Yes Funches, Adriana Mccallum, MD     Objective:  EXAM:   Vitals:   10/16/18 0913  BP: (!) 173/84  Pulse: 84  Temp: 98.1 F (36.7 C)  TempSrc: Oral  SpO2: 94%  Weight: 173 lb (78.5 kg)  Height: 5' 4.5" (1.638 m)    General appearance : A&OX3. NAD. Non-toxic-appearing HEENT: Atraumatic and Normocephalic.  PERRLA. EOM intact.   Chest/Lungs:  Breathing-non-labored, Good air entry bilaterally, breath sounds normal without rales, rhonchi, or wheezing  CVS: S1 S2 regular, no murmurs, gallops, rubs  Abdomen: Bowel sounds present, Non tender and not distended with no gaurding, rigidity or rebound. Extremities: Bilateral Lower Ext shows 1+ edema, both legs are warm to touch with = pulse throughout.  L foot S/P 5 digit amputation.  Pressure sore/Scab on distal plantar surface.  ~1cm scab that is tender with no evidence of infection Neurology:  CN II-XII grossly intact, Non focal.   Psych:  TP linear. J/I WNL. Normal speech. Appropriate eye contact and affect.  Skin:  No Rash  Data Review Lab Results  Component Value Date   HGBA1C 5.2 07/03/2018   HGBA1C 6.4 01/26/2018   HGBA1C 6.2 (H) 10/23/2017     Assessment & Plan   1. Pressure injury of left foot, stage 1 Created displacement gauze and fitted him with that and gently fitted coban(to hold guaze in place).  His says this helps.  Will have him see podiatry to see if they can fit him with an orthotic or something that will give him better support/even weight displacement.  - Ambulatory referral to Podiatry  2. Type 2 diabetes mellitus with chronic kidney disease,  with long-term current use of insulin, unspecified CKD stage (HCC) Controlled-continue current regimen - Glucose (CBG) - Insulin Glargine (LANTUS SOLOSTAR) 100 UNIT/ML Solostar Pen; Inject 5 Units into the skin daily at 10 pm.  Dispense: 45 mL; Refill: 3 - Lipid panel  3. Benign prostatic hyperplasia, unspecified whether lower urinary tract symptoms present - tamsulosin (FLOMAX) 0.4 MG CAPS capsule; Take 1 capsule (0.4 mg total) by mouth daily after supper.  Dispense: 90 capsule; Refill: 3  4. Essential hypertension Uncontrolled-resume medication - isosorbide mononitrate (IMDUR) 30 MG 24 hr tablet; Take 1 tablet (30 mg total) by mouth daily.  Dispense: 90 tablet; Refill: 3 - losartan (COZAAR) 50 MG tablet; Take 0.5 tablets (  25 mg total) by mouth daily.  Dispense: 90 tablet; Refill: 3 - furosemide (LASIX) 20 MG tablet; Take 1 tablet (20 mg total) by mouth daily. 1 tab PO Q Mon/Wed/Frid  Dispense: 90 tablet; Refill: 3 - Comprehensive metabolic panel - CBC with Differential/Platelet  5. Diabetic neuropathy - gabapentin (NEURONTIN) 100 MG capsule; Take 1 capsule (100 mg total) by mouth 3 (three) times daily.  Dispense: 270 capsule; Refill: 2  6. Leg edema resume - furosemide (LASIX) 20 MG tablet; Take 1 tablet (20 mg total) by mouth daily. 1 tab PO Q Mon/Wed/Frid  Dispense: 90 tablet; Refill: 3  7. CKD stage 4 due to type 2 diabetes mellitus (HCC) - furosemide (LASIX) 20 MG tablet; Take 1 tablet (20 mg total) by mouth daily. 1 tab PO Q Mon/Wed/Frid  Dispense: 90 tablet; Refill: 3  8. Mild CAD - aspirin EC 81 MG tablet; Take 1 tablet (81 mg total) by mouth daily.  Dispense: 90 tablet; Refill: 3  Patient have been counseled extensively about nutrition and exercise  Return in about 3 months (around 01/16/2019) for Dr Wynetta Emery; DM and htn, hyperlipidemia.  The patient was given clear instructions to go to ER or return to medical center if symptoms don't improve, worsen or new problems  develop. The patient verbalized understanding. The patient was told to call to get lab results if they haven't heard anything in the next week.     Freeman Caldron, PA-C [facility] [place], [place]   10/16/2018, 9:43 AM

## 2018-10-16 NOTE — Patient Instructions (Signed)
Guaze or padding   Apply vaseline 2 times daily to dry skin

## 2018-10-17 LAB — COMPREHENSIVE METABOLIC PANEL
A/G RATIO: 1.2 (ref 1.2–2.2)
ALBUMIN: 3.6 g/dL (ref 3.6–4.8)
ALK PHOS: 99 IU/L (ref 39–117)
ALT: 16 IU/L (ref 0–44)
AST: 15 IU/L (ref 0–40)
BUN / CREAT RATIO: 16 (ref 10–24)
BUN: 56 mg/dL — ABNORMAL HIGH (ref 8–27)
Bilirubin Total: 0.3 mg/dL (ref 0.0–1.2)
CO2: 20 mmol/L (ref 20–29)
CREATININE: 3.41 mg/dL — AB (ref 0.76–1.27)
Calcium: 8.6 mg/dL (ref 8.6–10.2)
Chloride: 107 mmol/L — ABNORMAL HIGH (ref 96–106)
GFR calc Af Amer: 20 mL/min/{1.73_m2} — ABNORMAL LOW (ref >59)
GFR, EST NON AFRICAN AMERICAN: 18 mL/min/{1.73_m2} — AB (ref >59)
GLOBULIN, TOTAL: 3 g/dL (ref 1.5–4.5)
Glucose: 98 mg/dL (ref 65–99)
POTASSIUM: 4.8 mmol/L (ref 3.5–5.2)
SODIUM: 140 mmol/L (ref 134–144)
Total Protein: 6.6 g/dL (ref 6.0–8.5)

## 2018-10-17 LAB — CBC WITH DIFFERENTIAL/PLATELET
BASOS: 0 %
Basophils Absolute: 0 10*3/uL (ref 0.0–0.2)
EOS (ABSOLUTE): 0.2 10*3/uL (ref 0.0–0.4)
EOS: 3 %
HEMATOCRIT: 29.1 % — AB (ref 37.5–51.0)
Hemoglobin: 9.6 g/dL — ABNORMAL LOW (ref 13.0–17.7)
Immature Grans (Abs): 0 10*3/uL (ref 0.0–0.1)
Immature Granulocytes: 0 %
LYMPHS ABS: 1.5 10*3/uL (ref 0.7–3.1)
Lymphs: 25 %
MCH: 28.2 pg (ref 26.6–33.0)
MCHC: 33 g/dL (ref 31.5–35.7)
MCV: 86 fL (ref 79–97)
MONOS ABS: 0.8 10*3/uL (ref 0.1–0.9)
Monocytes: 12 %
Neutrophils Absolute: 3.8 10*3/uL (ref 1.4–7.0)
Neutrophils: 60 %
PLATELETS: 266 10*3/uL (ref 150–450)
RBC: 3.4 x10E6/uL — ABNORMAL LOW (ref 4.14–5.80)
RDW: 13.7 % (ref 12.3–15.4)
WBC: 6.3 10*3/uL (ref 3.4–10.8)

## 2018-10-17 LAB — LIPID PANEL
CHOLESTEROL TOTAL: 153 mg/dL (ref 100–199)
Chol/HDL Ratio: 2.4 ratio (ref 0.0–5.0)
HDL: 64 mg/dL (ref >39)
LDL CALC: 77 mg/dL (ref 0–99)
TRIGLYCERIDES: 61 mg/dL (ref 0–149)
VLDL CHOLESTEROL CAL: 12 mg/dL (ref 5–40)

## 2018-10-18 ENCOUNTER — Other Ambulatory Visit: Payer: Self-pay | Admitting: Physician Assistant

## 2018-10-18 DIAGNOSIS — D649 Anemia, unspecified: Secondary | ICD-10-CM

## 2018-10-21 ENCOUNTER — Telehealth: Payer: Self-pay

## 2018-10-21 NOTE — Telephone Encounter (Signed)
-----   Message from Argentina Donovan, [place] sent at 10/18/2018  5:35 PM EST ----- Please call patient.  His kidney function continues to decline.  Adequate control of diabetes and other diseases is important.  His hemoglobin is low.  Please make sure he is taking his iron and have him schedule a lab appt in 3 weeks to recheck a CBC.  I am placing a future order.  Thanks, Freeman Caldron, PA-C

## 2018-10-21 NOTE — Telephone Encounter (Signed)
Patient was called and informed of lab results and to return in 3 weeks for repeat labs.

## 2018-11-09 ENCOUNTER — Other Ambulatory Visit: Payer: Self-pay | Admitting: Internal Medicine

## 2018-11-09 DIAGNOSIS — I10 Essential (primary) hypertension: Secondary | ICD-10-CM

## 2018-11-10 MED FILL — hydrALAZINE HCL 50 MG TABS: 50 | 90 days supply | Qty: 270 | Fill #0

## 2018-11-16 ENCOUNTER — Ambulatory Visit: Payer: Self-pay | Attending: Family Medicine

## 2018-11-16 DIAGNOSIS — D649 Anemia, unspecified: Secondary | ICD-10-CM | POA: Insufficient documentation

## 2018-11-16 MED FILL — GABAPENTIN 100 MG CAPSULE: 100 | 90 days supply | Qty: 270 | Fill #0

## 2018-11-16 MED FILL — !LANTUS SOLOSTAR 100UNITS/M: 100 | 84 days supply | Qty: 9 | Fill #1

## 2018-11-16 NOTE — Progress Notes (Signed)
Patient here for lab only 

## 2018-11-17 LAB — CBC WITH DIFFERENTIAL/PLATELET
BASOS: 0 %
Basophils Absolute: 0 10*3/uL (ref 0.0–0.2)
EOS (ABSOLUTE): 0.2 10*3/uL (ref 0.0–0.4)
EOS: 3 %
HEMATOCRIT: 38.9 % (ref 37.5–51.0)
HEMOGLOBIN: 12.1 g/dL — AB (ref 13.0–17.7)
IMMATURE GRANS (ABS): 0 10*3/uL (ref 0.0–0.1)
Immature Granulocytes: 0 %
LYMPHS: 31 %
Lymphocytes Absolute: 2.1 10*3/uL (ref 0.7–3.1)
MCH: 26.4 pg — AB (ref 26.6–33.0)
MCHC: 31.1 g/dL — ABNORMAL LOW (ref 31.5–35.7)
MCV: 85 fL (ref 79–97)
MONOCYTES: 8 %
Monocytes Absolute: 0.6 10*3/uL (ref 0.1–0.9)
Neutrophils Absolute: 3.9 10*3/uL (ref 1.4–7.0)
Neutrophils: 58 %
Platelets: 280 10*3/uL (ref 150–450)
RBC: 4.58 x10E6/uL (ref 4.14–5.80)
RDW: 14.6 % (ref 12.3–15.4)
WBC: 6.8 10*3/uL (ref 3.4–10.8)

## 2018-11-20 ENCOUNTER — Telehealth: Payer: Self-pay | Admitting: Internal Medicine

## 2018-11-20 NOTE — Telephone Encounter (Signed)
Patient called requesting to speak with Levada Dy. Patient did not want to disclose what he had to ask her but stated he only had a question to ask. Please f/u

## 2018-11-22 NOTE — Telephone Encounter (Signed)
Please call patient and find out what his questions are as I am not in the office until Tuesday and we are only there 1/2 day and closed on Wednesday.  Thanks, Freeman Caldron, PA-C

## 2018-11-23 ENCOUNTER — Telehealth: Payer: Self-pay

## 2018-11-23 NOTE — Telephone Encounter (Signed)
Pt has question concerning his Flomax. Informed the instructions are directed to take daily after super.  Informed he has 3 medication for blood pressure to take daily as well.  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 Pt verbalized understanding.

## 2018-11-23 NOTE — Telephone Encounter (Signed)
-----   Message from Argentina Donovan, [place] sent at 11/19/2018  7:28 AM EST ----- Please call patient and let him know the anemia/his blood count has improved.  Follow-up as planned.  Thanks, Freeman Caldron, PA-C

## 2018-11-23 NOTE — Telephone Encounter (Signed)
Patient was called and a voicemail was left informing patient to return phone call for ab results.

## 2018-11-24 NOTE — Telephone Encounter (Signed)
Patient was called and informed of lab results. 

## 2019-01-25 ENCOUNTER — Ambulatory Visit: Payer: Self-pay | Admitting: Internal Medicine

## 2019-01-26 ENCOUNTER — Ambulatory Visit: Payer: Self-pay | Admitting: Internal Medicine

## 2019-02-19 ENCOUNTER — Other Ambulatory Visit: Payer: Self-pay | Admitting: Internal Medicine

## 2019-02-19 DIAGNOSIS — I10 Essential (primary) hypertension: Secondary | ICD-10-CM

## 2019-02-19 MED FILL — hydrALAZINE HCL 50 MG TABS: 50 | 90 days supply | Qty: 270 | Fill #0

## 2019-02-19 MED FILL — ISOSORBIDE MN ER 30 MG TAB: 30 | 90 days supply | Qty: 90 | Fill #1

## 2019-02-19 MED FILL — TRUEPLUS PEN NDL 32GX5/32: 32G X 4 MM | 30 days supply | Qty: 100 | Fill #1

## 2019-02-19 MED FILL — ?FUROSEMIDE 20 MG TABLET: 20 | 90 days supply | Qty: 90 | Fill #1

## 2019-02-19 MED FILL — TRUEPLUS PEN NDL 32GX5/32": 32G X 4 MM | 30 days supply | Qty: 100 | Fill #1

## 2019-02-19 MED FILL — TAMSULOSIN HCL 0.4 MG CAP: 0.4 | 90 days supply | Qty: 90 | Fill #1

## 2019-02-19 MED FILL — $LANTUS SOLOSTAR 100 UNITS/: 100 | 84 days supply | Qty: 9 | Fill #2

## 2019-02-19 MED FILL — GABAPENTIN 100 MG CAP: 100 | 90 days supply | Qty: 270 | Fill #1

## 2019-02-19 MED FILL — LOSARTAN POTASSIUM 50 MG TA: 50 | 90 days supply | Qty: 45 | Fill #1

## 2019-02-23 ENCOUNTER — Ambulatory Visit: Payer: Self-pay | Admitting: Internal Medicine

## 2019-02-23 ENCOUNTER — Other Ambulatory Visit: Payer: Self-pay

## 2019-03-02 ENCOUNTER — Encounter: Payer: Self-pay | Admitting: Internal Medicine

## 2019-03-02 ENCOUNTER — Ambulatory Visit: Payer: Self-pay | Attending: Internal Medicine | Admitting: Internal Medicine

## 2019-03-02 ENCOUNTER — Other Ambulatory Visit: Payer: Self-pay

## 2019-03-02 VITALS — BP 164/84 | HR 82 | Temp 98.2°F | Resp 16 | Wt 159.8 lb

## 2019-03-02 DIAGNOSIS — D649 Anemia, unspecified: Secondary | ICD-10-CM | POA: Insufficient documentation

## 2019-03-02 DIAGNOSIS — E1140 Type 2 diabetes mellitus with diabetic neuropathy, unspecified: Secondary | ICD-10-CM | POA: Insufficient documentation

## 2019-03-02 DIAGNOSIS — Z7982 Long term (current) use of aspirin: Secondary | ICD-10-CM | POA: Insufficient documentation

## 2019-03-02 DIAGNOSIS — E1122 Type 2 diabetes mellitus with diabetic chronic kidney disease: Secondary | ICD-10-CM

## 2019-03-02 DIAGNOSIS — I10 Essential (primary) hypertension: Secondary | ICD-10-CM

## 2019-03-02 DIAGNOSIS — R269 Unspecified abnormalities of gait and mobility: Secondary | ICD-10-CM | POA: Insufficient documentation

## 2019-03-02 DIAGNOSIS — I130 Hypertensive heart and chronic kidney disease with heart failure and stage 1 through stage 4 chronic kidney disease, or unspecified chronic kidney disease: Secondary | ICD-10-CM | POA: Insufficient documentation

## 2019-03-02 DIAGNOSIS — E11319 Type 2 diabetes mellitus with unspecified diabetic retinopathy without macular edema: Secondary | ICD-10-CM

## 2019-03-02 DIAGNOSIS — Z86718 Personal history of other venous thrombosis and embolism: Secondary | ICD-10-CM | POA: Insufficient documentation

## 2019-03-02 DIAGNOSIS — I5032 Chronic diastolic (congestive) heart failure: Secondary | ICD-10-CM

## 2019-03-02 DIAGNOSIS — N184 Chronic kidney disease, stage 4 (severe): Secondary | ICD-10-CM

## 2019-03-02 DIAGNOSIS — I872 Venous insufficiency (chronic) (peripheral): Secondary | ICD-10-CM | POA: Insufficient documentation

## 2019-03-02 DIAGNOSIS — Z87891 Personal history of nicotine dependence: Secondary | ICD-10-CM | POA: Insufficient documentation

## 2019-03-02 DIAGNOSIS — M79606 Pain in leg, unspecified: Secondary | ICD-10-CM | POA: Insufficient documentation

## 2019-03-02 DIAGNOSIS — I2510 Atherosclerotic heart disease of native coronary artery without angina pectoris: Secondary | ICD-10-CM | POA: Insufficient documentation

## 2019-03-02 DIAGNOSIS — Z794 Long term (current) use of insulin: Secondary | ICD-10-CM

## 2019-03-02 DIAGNOSIS — Z79899 Other long term (current) drug therapy: Secondary | ICD-10-CM | POA: Insufficient documentation

## 2019-03-02 DIAGNOSIS — R600 Localized edema: Secondary | ICD-10-CM

## 2019-03-02 LAB — POCT GLYCOSYLATED HEMOGLOBIN (HGB A1C): HbA1c, POC (prediabetic range): 6.2 % (ref 5.7–6.4)

## 2019-03-02 LAB — GLUCOSE, POCT (MANUAL RESULT ENTRY): POC Glucose: 115 mg/dl — AB (ref 70–99)

## 2019-03-02 MED ORDER — FUROSEMIDE 20 MG PO TABS: ORAL_TABLET | ORAL | 3 refills | Status: AC

## 2019-03-02 MED ORDER — HYDRALAZINE HCL 25 MG PO TABS: ORAL_TABLET | ORAL | 3 refills | Status: AC

## 2019-03-02 MED FILL — ?FUROSEMIDE 20 MG TABLET: 20 | 30 days supply | Qty: 45 | Fill #0

## 2019-03-02 MED FILL — hydrALAZINE HCL 25 MG TABS: 25 | 30 days supply | Qty: 90 | Fill #0

## 2019-03-02 NOTE — Progress Notes (Signed)
Patient ID: Joshua [name], male    DOB: 06/05/1951  MRN: [id number]  CC: Hypertension and Diabetes   Subjective: Joshua [name] is a 68 y.o. male who presents for chronic ds management His concerns today include:  Patient with history of HTN, CKD IV, BPH, DM with neuropathy,nephropathy and retinopathy, ACD, chronic diastolic CHF, s/p Lt transmetatarsal amputation  BPH:  Try to return Flomax today to the pharmacy because he has not been taking.  Patient states he has not had any problems passing his urine.  He is upset because the pharmacy has told him that they cannot accept return medications.  HYPERTENSION/CHF Currently taking: Patient did not bring his medicines with him today.  He knows them by color but I told him that I know them only by name.  He is able to tell me that he is taking a blood pressure medication 3 times a day which is the hydralazine.  He tells me that furosemide was increased from 20 mg 3 times a week to 20 mg daily when he saw the PA in November Med Adherence: [x]  Yes but does not know them by name   []  No Medication side effects: []  Yes    [x]  No Adherence with salt restriction: []  Yes    [x]  No.  He lives with a cousin and states that the cousin does not limit salt in the foods and he feels that he is at the [facility]. Home Monitoring?: []  Yes    [x]  No, no device Monitoring Frequency: []  Yes    []  No Home BP results range: []  Yes    []  No SOB? []  Yes    [x]  No Chest Pain?: []  Yes    [x]  No Leg swelling?: [x]  Yes    []  No Headaches?: []  Yes    [x]  No Dizziness? []  Yes    [x]  No Comments:   CKD: He has not been able to see a nephrologist.  Has Medicare part A.  He would like assistance in getting part B so that he can get into see the specialist. Not on NSAIDS  DM:  No device to check BS Drinks Apple juice.  Loves fruits - apples, grapes and bananas Endorses blurred vision and tingling in feet.  He has not seen an eye doctor in several years.  Patient  Active Problem List   Diagnosis Date Noted  . Hyperkalemia 03/25/2017  . Seborrhea 09/16/2016  . Elevated PSA 01/04/2016  . Anemia 01/04/2016  . Rhinitis, allergic 01/02/2016  . Daytime somnolence 12/05/2015  . BPH (benign prostatic hyperplasia) 12/05/2015  . Status post transmetatarsal amputation of left foot ([place]) 08/30/2015  . Mild CAD 06/14/2015  . CKD (chronic kidney disease) stage 3, GFR 30-59 ml/min (HCC) 06/14/2015  . Inguinal pain 05/10/2014  . Chest pain 04/13/2014  . Anxiety state, unspecified 03/06/2014  . Nephrotic syndrome 01/14/2014  . Elevated alkaline phosphatase level 01/11/2014  . History of DVT of lower extremity 01/11/2014  . Chronic diastolic heart failure ([place]) 01/11/2014  . New onset seizure ([place]) 01/07/2014  . Chronic leg pain 01/05/2014  . Diabetic neuropathy 03/22/2013  . Abnormality of gait 02/27/2013  . Diabetic macular edema ([place]) 12/22/2012  . Diabetic proliferative retinopathy ([place]) 12/22/2012  . Intragel vitreous hemorrhage ([place]) 12/22/2012  . Diabetes mellitus with renal manifestation ([place]) 10/15/2012  . Hypoalbuminemia 10/15/2012  . Diabetic nephropathy with proteinuria ([place]) 10/15/2012  . High risk sexual behavior 10/14/2012  . Hypertension 10/14/2012  . Chronic venous insufficiency  10/07/2012     Current Outpatient Medications on File Prior to Visit  Medication Sig Dispense Refill  . acetaminophen (TYLENOL 8 HOUR) 650 MG CR tablet Take 1 tablet (650 mg total) by mouth every 8 (eight) hours as needed for pain. 90 tablet 2  . aspirin EC 81 MG tablet Take 1 tablet (81 mg total) by mouth daily. 90 tablet 3  . fexofenadine (ALLEGRA ALLERGY) 180 MG tablet Take 1 tablet (180 mg total) by mouth daily.    Marland Kitchen gabapentin (NEURONTIN) 100 MG capsule Take 1 capsule (100 mg total) by mouth 3 (three) times daily. 270 capsule 2  . Glucosamine-Chondroitin 750-600 MG TABS Take 2 tablets by mouth daily.  0  . hydrALAZINE (APRESOLINE) 50 MG tablet TAKE 1 TABLET  BY MOUTH 3 TIMES DAILY. 90 tablet 2  . Insulin Glargine (LANTUS SOLOSTAR) 100 UNIT/ML Solostar Pen Inject 5 Units into the skin daily at 10 pm. 45 mL 3  . Insulin Pen Needle (TRUEPLUS PEN NEEDLES) 32G X 4 MM MISC USE AS DIRECTED ONCE DAILY TO ADMINISTER INSULIN 100 each 3  . IRON, FERROUS GLUCONATE, PO Take 18 mg by mouth daily.    . isosorbide mononitrate (IMDUR) 30 MG 24 hr tablet Take 1 tablet (30 mg total) by mouth daily. 90 tablet 3  . losartan (COZAAR) 50 MG tablet TAKE 1/2 TABLET BY MOUTH DAILY. 15 tablet 2  . OVER THE COUNTER MEDICATION Take 2 sprays by mouth daily. Reported on 01/02/2016    . Selenium Sulfide 2.25 % SHAM Apply 1 application topically daily. Apply to beard area, let dry then wash out 180 mL 1  . tamsulosin (FLOMAX) 0.4 MG CAPS capsule Take 1 capsule (0.4 mg total) by mouth daily after supper. 90 capsule 3  . Turmeric 500 MG CAPS Take 500 mg by mouth daily.     No current facility-administered medications on file prior to visit.     No Known Allergies  Social History   Socioeconomic History  . Marital status: Single    Spouse name: Not on file  . Number of children: 5  . Years of education: Masters  . Highest education level: Not on file  Occupational History  . Occupation: Surveyor, quantity: [employer]  . Financial resource strain: Not on file  . Food insecurity:    Worry: Not on file    Inability: Not on file  . Transportation needs:    Medical: Not on file    Non-medical: Not on file  Tobacco Use  . Smoking status: Former Smoker    Years: 20.00    Types: Cigarettes    Last attempt to quit: 11/25/1992    Years since quitting: 26.2  . Smokeless tobacco: Never Used  Substance and Sexual Activity  . Alcohol use: No    Alcohol/week: 15.0 standard drinks    Types: 15 Standard drinks or equivalent per week    Comment: Rarely.  . Drug use: No  . Sexual activity: Not on file    Comment: 09/2012: > 15 partners in past 3 years, inconsistent  condom use  Lifestyle  . Physical activity:    Days per week: Not on file    Minutes per session: Not on file  . Stress: Not on file  Relationships  . Social connections:    Talks on phone: Not on file    Gets together: Not on file    Attends religious service: Not on file  Active member of club or organization: Not on file    Attends meetings of clubs or organizations: Not on file    Relationship status: Not on file  . Intimate partner violence:    Fear of current or ex partner: Not on file    Emotionally abused: Not on file    Physically abused: Not on file    Forced sexual activity: Not on file  Other Topics Concern  . Not on file  Social History Narrative   Has a masters degree in education    Family History  Problem Relation Age of Onset  . GI problems Unknown        none    Past Surgical History:  Procedure Laterality Date  . I&D EXTREMITY Left 01/17/2014   Procedure: IRRIGATION AND DEBRIDEMENT  LEFT FOOT;  Surgeon: Wylene Simmer, MD;  Location: [facility];  Service: Orthopedics;  Laterality: Left;  . KNEE ARTHROSCOPY  2010   3 times, right knee  . LEFT HEART CATHETERIZATION WITH CORONARY ANGIOGRAM N/A 02/02/2014   Procedure: LEFT HEART CATHETERIZATION WITH CORONARY ANGIOGRAM;  Surgeon: Burnell Blanks, MD;  Location[facility] CATH LAB;  Service: Cardiovascular;  Laterality: N/A;  . TRANSMETATARSAL AMPUTATION Left 07/14/2014   Procedure: TRANSMETATARSAL AMPUTATION;  Surgeon: Wylene Simmer, MD;  Location: [facility];  Service: Orthopedics;  Laterality: Left;  percutaneous achilles tendon lengthening    ROS: Review of Systems Negative except as stated above  PHYSICAL EXAM: BP (!) 164/84   Pulse 82   Temp 98.2 F (36.8 C) (Oral)   Resp 16   Wt 159 lb 12.8 oz (72.5 kg)   SpO2 95%   BMI 27.01 kg/m   BP 150/90 Physical Exam  General appearance - alert, well appearing, and in no distress Mental status - normal mood, behavior, speech, dress, motor activity, and thought  processes Neck - supple, no significant adenopathy Chest -breath sounds are decreased at the bases.   Heart - normal rate, regular rhythm, normal S1, S2, no murmurs, rubs, clicks or gallops Extremities -1+ bilateral lower extremity edema Diabetic Foot Exam - Simple   Simple Foot Form Visual Inspection See comments:  Yes Sensation Testing See comments:  Yes Pulse Check See comments:  Yes Comments Patient has amputation of the left forefoot.  He has a small callus of about 2 cm on the sole of the left foot.  Dorsalis pedis and posterior tibialis pulse on the right slightly decreased.     Results for orders placed or performed in visit on 03/02/19  POCT glucose (manual entry)  Result Value Ref Range   POC Glucose 115 (A) 70 - 99 mg/dl  POCT glycosylated hemoglobin (Hb A1C)  Result Value Ref Range   Hemoglobin A1C     HbA1c POC (<> result, manual entry)     HbA1c, POC (prediabetic range) 6.2 5.7 - 6.4 %   HbA1c, POC (controlled diabetic range)      CMP Latest Ref Rng & Units 10/16/2018 04/24/2018 01/22/2018  Glucose 65 - 99 mg/dL 98 81 122(H)  BUN 8 - 27 mg/dL 56(H) 44(H) 43(H)  Creatinine 0.76 - 1.27 mg/dL 3.41(H) 3.13(H) 2.94(H)  Sodium 134 - 144 mmol/L 140 139 138  Potassium 3.5 - 5.2 mmol/L 4.8 4.9 4.8  Chloride 96 - 106 mmol/L 107(H) 105 105  CO2 20 - 29 mmol/L 20 20 19(L)  Calcium 8.6 - 10.2 mg/dL 8.6 8.6 8.9  Total Protein 6.0 - 8.5 g/dL 6.6 7.5 -  Total Bilirubin 0.0 -  1.2 mg/dL 0.3 0.3 -  Alkaline Phos 39 - 117 IU/L 99 112 -  AST 0 - 40 IU/L 15 25 -  ALT 0 - 44 IU/L 16 26 -   Lipid Panel     Component Value Date/Time   CHOL 153 10/16/2018 1159   TRIG 61 10/16/2018 1159   HDL 64 10/16/2018 1159   CHOLHDL 2.4 10/16/2018 1159   CHOLHDL 2.9 09/16/2016 1135   VLDL 23 09/16/2016 1135   LDLCALC 77 10/16/2018 1159    CBC    Component Value Date/Time   WBC 6.8 11/16/2018 1122   WBC 9.7 01/02/2016 1105   RBC 4.58 11/16/2018 1122   RBC 3.97 (L) 01/02/2016 1105    HGB 12.1 (L) 11/16/2018 1122   HCT 38.9 11/16/2018 1122   PLT 280 11/16/2018 1122   MCV 85 11/16/2018 1122   MCH 26.4 (L) 11/16/2018 1122   MCH 27.5 01/02/2016 1105   MCHC 31.1 (L) 11/16/2018 1122   MCHC 33.3 01/02/2016 1105   RDW 14.6 11/16/2018 1122   LYMPHSABS 2.1 11/16/2018 1122   MONOABS 1.0 07/14/2014 0440   EOSABS 0.2 11/16/2018 1122   BASOSABS 0.0 11/16/2018 1122    ASSESSMENT AND PLAN: 1. Type 2 diabetes mellitus with chronic kidney disease, with long-term current use of insulin, unspecified CKD stage (HCC) At goal.  Continue Lantus 5 units daily. Dietary counseling given.  Encourage patient to eliminate sugary drinks like juices, sweet tea and sodas from his diet. Needs to see a podiatrist regarding the callus on the sole of the left foot but patient is unable to afford to see one at this time. - POCT glucose (manual entry) - POCT glycosylated hemoglobin (Hb A1C)  2. Essential hypertension Not at goal.  Increase hydralazine to 75 mg 3 times a day. Encourage low-salt diet.  Encouraged him to talk with his cousin about this since he usually does the cooking - furosemide (LASIX) 20 MG tablet; 1 1/2 tab PO daily  Dispense: 90 tablet; Refill: 3 - hydrALAZINE (APRESOLINE) 25 MG tablet; Take 1 tablet three times a day with a 50 mg tablet.  Dispense: 90 tablet; Refill: 3  3. Leg edema 4. Chronic diastolic congestive heart failure (HCC) Decrease salt encouraged.  Increase furosemide to 30 mg daily.  Increase hydralazine - furosemide (LASIX) 20 MG tablet; 1 1/2 tab PO daily  Dispense: 90 tablet; Refill: 3  5. CKD stage 4 due to type 2 diabetes mellitus ([place]) We desperately need to get him to nephrology.  We will have case worker touch base with him about applying for Medicare part B but I think this can only be done during open enrollment.  In the fall - Basic Metabolic Panel - furosemide (LASIX) 20 MG tablet; 1 1/2 tab PO daily  Dispense: 90 tablet; Refill: 3  6. Diabetic  retinopathy associated with type 2 diabetes mellitus, macular edema presence unspecified, unspecified laterality, unspecified retinopathy severity ([place])   After I had completed the visit and was discharging the patient, patient shouted something down the hall about wanting something for his snoring.  We will discuss this on his next visit. Patient was given the opportunity to ask questions.  Patient verbalized understanding of the plan and was able to repeat key elements of the plan.   Orders Placed This Encounter  Procedures  . Basic Metabolic Panel  . POCT glucose (manual entry)  . POCT glycosylated hemoglobin (Hb A1C)     Requested Prescriptions   Signed Prescriptions Disp  Refills  . furosemide (LASIX) 20 MG tablet 90 tablet 3    Sig: 1 1/2 tab PO daily  . hydrALAZINE (APRESOLINE) 25 MG tablet 90 tablet 3    Sig: Take 1 tablet three times a day with a 50 mg tablet.    Return in about 3 months (around 06/01/2019).  Karle Plumber, MD, FACP

## 2019-03-02 NOTE — Patient Instructions (Addendum)
Increase hydralazine to 75 mg 3 times a day.  This means he will take a 50 mg tablet along with a 25 mg tablet.  You have swelling in the lower legs.  We have increased the furosemide from 20 mg a day to 30 mg a day.  This means you will take 1-1/2 tablets daily.  I will have our caseworker contact you to try help with getting Medicare part B so that we can get you to the specialist.

## 2019-05-12 MED FILL — ?FUROSEMIDE 20 MG TABLET: 20 | 30 days supply | Qty: 45 | Fill #1

## 2019-05-12 MED FILL — TRUEPLUS PEN NDL 32GX5/32": 32G X 4 MM | 30 days supply | Qty: 100 | Fill #2

## 2019-05-12 MED FILL — $LANTUS SOLOSTAR 100 UNITS/: 100 | 56 days supply | Qty: 6 | Fill #3

## 2019-05-12 MED FILL — TRUEPLUS PEN NDL 32GX5/32: 32G X 4 MM | 30 days supply | Qty: 100 | Fill #2

## 2019-05-17 MED FILL — ISOSORBIDE MN ER 30 MG TAB: 30 | 90 days supply | Qty: 90 | Fill #2

## 2019-05-17 MED FILL — LOSARTAN POTASSIUM 50 MG TA: 50 | 90 days supply | Qty: 45 | Fill #2

## 2019-05-17 MED FILL — GABAPENTIN 100 MG CAP: 100 | 90 days supply | Qty: 270 | Fill #2

## 2019-05-17 MED FILL — TAMSULOSIN HCL 0.4 MG CAP: 0.4 | 90 days supply | Qty: 90 | Fill #2

## 2019-05-25 MED FILL — LANTUS SOLOSTAR 100 UNITS/M: 100 | 28 days supply | Qty: 3 | Fill #4

## 2019-05-25 MED FILL — hydrALAZINE HCL 25 MG TABS: 25 | 90 days supply | Qty: 270 | Fill #1

## 2019-05-26 ENCOUNTER — Other Ambulatory Visit: Payer: Self-pay | Admitting: Internal Medicine

## 2019-05-26 DIAGNOSIS — I10 Essential (primary) hypertension: Secondary | ICD-10-CM

## 2019-05-26 MED FILL — hydrALAZINE HCL 50 MG TABS: 50 | 30 days supply | Qty: 90 | Fill #0

## 2019-05-26 NOTE — Telephone Encounter (Signed)
Pt called state he is to take 75mg  three times a day. Pt needs prescription.   hydrALAZINE (APRESOLINE) 25 MG tablet 90 tablet 3     Sig: Take 1 tablet three times a day with a 50 mg tablet.    Total 75 mg. Pt request three month supply sent to [facility] pharmacy. Pt ph [phone] for questions.

## 2019-06-17 ENCOUNTER — Telehealth: Payer: Self-pay | Admitting: Internal Medicine

## 2019-06-17 NOTE — Telephone Encounter (Signed)
Will forward to pcp

## 2019-06-17 NOTE — Telephone Encounter (Signed)
Mohammad from a [facility] called stating he will come by next week to drop off a death certificate for this patient.  [phone]

## 2019-06-26 DEATH — deceased
